# Patient Record
Sex: Female | Born: 1950
Health system: Southern US, Community
[De-identification: ages and names within clinical notes are randomized; demographics above are authoritative.]

## PROBLEM LIST (undated history)

## (undated) ENCOUNTER — Emergency Department (HOSPITAL_BASED_OUTPATIENT_CLINIC_OR_DEPARTMENT_OTHER): Payer: PPO

## (undated) DIAGNOSIS — M797 Fibromyalgia: Secondary | ICD-10-CM

## (undated) DIAGNOSIS — Z9889 Other specified postprocedural states: Secondary | ICD-10-CM

## (undated) DIAGNOSIS — I10 Essential (primary) hypertension: Secondary | ICD-10-CM

## (undated) DIAGNOSIS — R51 Headache: Secondary | ICD-10-CM

## (undated) DIAGNOSIS — H3530 Unspecified macular degeneration: Secondary | ICD-10-CM

## (undated) DIAGNOSIS — M549 Dorsalgia, unspecified: Secondary | ICD-10-CM

## (undated) DIAGNOSIS — R112 Nausea with vomiting, unspecified: Secondary | ICD-10-CM

## (undated) DIAGNOSIS — F329 Major depressive disorder, single episode, unspecified: Secondary | ICD-10-CM

## (undated) DIAGNOSIS — I219 Acute myocardial infarction, unspecified: Secondary | ICD-10-CM

## (undated) DIAGNOSIS — H269 Unspecified cataract: Secondary | ICD-10-CM

## (undated) DIAGNOSIS — N83201 Unspecified ovarian cyst, right side: Secondary | ICD-10-CM

## (undated) DIAGNOSIS — F909 Attention-deficit hyperactivity disorder, unspecified type: Secondary | ICD-10-CM

## (undated) DIAGNOSIS — G8929 Other chronic pain: Secondary | ICD-10-CM

## (undated) DIAGNOSIS — Z8489 Family history of other specified conditions: Secondary | ICD-10-CM

## (undated) DIAGNOSIS — E559 Vitamin D deficiency, unspecified: Secondary | ICD-10-CM

## (undated) DIAGNOSIS — T7840XA Allergy, unspecified, initial encounter: Secondary | ICD-10-CM

## (undated) DIAGNOSIS — M1990 Unspecified osteoarthritis, unspecified site: Secondary | ICD-10-CM

## (undated) DIAGNOSIS — F32A Depression, unspecified: Secondary | ICD-10-CM

## (undated) DIAGNOSIS — R102 Pelvic and perineal pain: Secondary | ICD-10-CM

## (undated) DIAGNOSIS — Z5189 Encounter for other specified aftercare: Secondary | ICD-10-CM

## (undated) DIAGNOSIS — Z862 Personal history of diseases of the blood and blood-forming organs and certain disorders involving the immune mechanism: Secondary | ICD-10-CM

## (undated) DIAGNOSIS — F419 Anxiety disorder, unspecified: Secondary | ICD-10-CM

## (undated) DIAGNOSIS — I2510 Atherosclerotic heart disease of native coronary artery without angina pectoris: Secondary | ICD-10-CM

## (undated) DIAGNOSIS — R011 Cardiac murmur, unspecified: Secondary | ICD-10-CM

## (undated) DIAGNOSIS — D649 Anemia, unspecified: Secondary | ICD-10-CM

## (undated) DIAGNOSIS — R519 Headache, unspecified: Secondary | ICD-10-CM

## (undated) DIAGNOSIS — N189 Chronic kidney disease, unspecified: Secondary | ICD-10-CM

## (undated) DIAGNOSIS — E785 Hyperlipidemia, unspecified: Secondary | ICD-10-CM

## (undated) HISTORY — DX: Nausea with vomiting, unspecified: R11.2

## (undated) HISTORY — DX: Depression, unspecified: F32.A

## (undated) HISTORY — DX: Other chronic pain: G89.29

## (undated) HISTORY — DX: Fibromyalgia: M79.7

## (undated) HISTORY — DX: Anemia, unspecified: D64.9

## (undated) HISTORY — DX: Unspecified cataract: H26.9

## (undated) HISTORY — DX: Unspecified macular degeneration: H35.30

## (undated) HISTORY — DX: Chronic kidney disease, unspecified: N18.9

## (undated) HISTORY — DX: Unspecified ovarian cyst, right side: N83.201

## (undated) HISTORY — DX: Other specified postprocedural states: Z98.890

## (undated) HISTORY — DX: Major depressive disorder, single episode, unspecified: F32.9

## (undated) HISTORY — DX: Unspecified osteoarthritis, unspecified site: M19.90

## (undated) HISTORY — DX: Anxiety disorder, unspecified: F41.9

## (undated) HISTORY — DX: Attention-deficit hyperactivity disorder, unspecified type: F90.9

## (undated) HISTORY — DX: Dorsalgia, unspecified: M54.9

## (undated) HISTORY — DX: Headache: R51

## (undated) HISTORY — DX: Vitamin D deficiency, unspecified: E55.9

## (undated) HISTORY — DX: Cardiac murmur, unspecified: R01.1

## (undated) HISTORY — PX: WISDOM TOOTH EXTRACTION: SHX21

## (undated) HISTORY — PX: BACK SURGERY: SHX140

## (undated) HISTORY — DX: Personal history of diseases of the blood and blood-forming organs and certain disorders involving the immune mechanism: Z86.2

## (undated) HISTORY — DX: Hyperlipidemia, unspecified: E78.5

## (undated) HISTORY — DX: Allergy, unspecified, initial encounter: T78.40XA

## (undated) HISTORY — DX: Encounter for other specified aftercare: Z51.89

## (undated) HISTORY — PX: TUBAL LIGATION: SHX77

## (undated) HISTORY — DX: Pelvic and perineal pain: R10.2

## (undated) HISTORY — DX: Headache, unspecified: R51.9

---

## 1973-11-12 HISTORY — PX: APPENDECTOMY: SHX54

## 1987-11-13 HISTORY — PX: PELVIC LAPAROSCOPY: SHX162

## 1992-11-12 HISTORY — PX: ABDOMINAL HYSTERECTOMY: SHX81

## 1994-11-12 HISTORY — PX: KNEE SURGERY: SHX244

## 1999-09-18 ENCOUNTER — Other Ambulatory Visit: Admission: RE | Admit: 1999-09-18 | Discharge: 1999-09-18 | Payer: Self-pay | Admitting: Obstetrics and Gynecology

## 2000-05-20 ENCOUNTER — Emergency Department (HOSPITAL_COMMUNITY): Admission: EM | Admit: 2000-05-20 | Discharge: 2000-05-20 | Payer: Self-pay | Admitting: Emergency Medicine

## 2000-05-24 ENCOUNTER — Emergency Department (HOSPITAL_COMMUNITY): Admission: EM | Admit: 2000-05-24 | Discharge: 2000-05-24 | Payer: Self-pay

## 2001-03-20 ENCOUNTER — Other Ambulatory Visit: Admission: RE | Admit: 2001-03-20 | Discharge: 2001-03-20 | Payer: Self-pay | Admitting: Obstetrics and Gynecology

## 2001-06-30 ENCOUNTER — Encounter: Payer: Self-pay | Admitting: Neurology

## 2001-06-30 ENCOUNTER — Ambulatory Visit (HOSPITAL_COMMUNITY): Admission: RE | Admit: 2001-06-30 | Discharge: 2001-06-30 | Payer: Self-pay | Admitting: Neurology

## 2002-11-27 ENCOUNTER — Other Ambulatory Visit: Admission: RE | Admit: 2002-11-27 | Discharge: 2002-11-27 | Payer: Self-pay | Admitting: Obstetrics and Gynecology

## 2005-06-25 ENCOUNTER — Other Ambulatory Visit: Admission: RE | Admit: 2005-06-25 | Discharge: 2005-06-25 | Payer: Self-pay | Admitting: Obstetrics and Gynecology

## 2006-01-11 ENCOUNTER — Encounter: Admission: RE | Admit: 2006-01-11 | Discharge: 2006-01-11 | Payer: Self-pay | Admitting: Obstetrics and Gynecology

## 2006-01-15 ENCOUNTER — Emergency Department (HOSPITAL_COMMUNITY): Admission: EM | Admit: 2006-01-15 | Discharge: 2006-01-15 | Payer: Self-pay | Admitting: Emergency Medicine

## 2006-02-02 ENCOUNTER — Encounter: Admission: RE | Admit: 2006-02-02 | Discharge: 2006-02-02 | Payer: Self-pay | Admitting: Neurology

## 2006-02-04 ENCOUNTER — Encounter: Admission: RE | Admit: 2006-02-04 | Discharge: 2006-02-04 | Payer: Self-pay | Admitting: Obstetrics and Gynecology

## 2006-06-26 ENCOUNTER — Other Ambulatory Visit: Admission: RE | Admit: 2006-06-26 | Discharge: 2006-06-26 | Payer: Self-pay | Admitting: Obstetrics and Gynecology

## 2007-07-22 ENCOUNTER — Encounter: Admission: RE | Admit: 2007-07-22 | Discharge: 2007-10-20 | Payer: Self-pay | Admitting: Neurology

## 2007-10-01 ENCOUNTER — Other Ambulatory Visit: Admission: RE | Admit: 2007-10-01 | Discharge: 2007-10-01 | Payer: Self-pay | Admitting: Obstetrics and Gynecology

## 2008-11-09 ENCOUNTER — Encounter: Payer: Self-pay | Admitting: Obstetrics and Gynecology

## 2008-11-09 ENCOUNTER — Ambulatory Visit: Payer: Self-pay | Admitting: Obstetrics and Gynecology

## 2008-11-09 ENCOUNTER — Other Ambulatory Visit: Admission: RE | Admit: 2008-11-09 | Discharge: 2008-11-09 | Payer: Self-pay | Admitting: Obstetrics and Gynecology

## 2009-02-03 ENCOUNTER — Encounter: Admission: RE | Admit: 2009-02-03 | Discharge: 2009-02-03 | Payer: Self-pay | Admitting: Neurology

## 2009-02-18 ENCOUNTER — Encounter: Admission: RE | Admit: 2009-02-18 | Discharge: 2009-02-18 | Payer: Self-pay | Admitting: Neurosurgery

## 2009-09-28 ENCOUNTER — Encounter: Admission: RE | Admit: 2009-09-28 | Discharge: 2009-09-28 | Payer: Self-pay | Admitting: Neurosurgery

## 2010-01-24 ENCOUNTER — Other Ambulatory Visit: Admission: RE | Admit: 2010-01-24 | Discharge: 2010-01-24 | Payer: Self-pay | Admitting: Obstetrics and Gynecology

## 2010-01-24 ENCOUNTER — Ambulatory Visit: Payer: Self-pay | Admitting: Obstetrics and Gynecology

## 2011-04-25 ENCOUNTER — Other Ambulatory Visit (HOSPITAL_COMMUNITY)
Admission: RE | Admit: 2011-04-25 | Discharge: 2011-04-25 | Disposition: A | Discharge status: Home / Self Care | Payer: BC Managed Care – PPO | Source: Ambulatory Visit | Attending: Obstetrics and Gynecology | Admitting: Obstetrics and Gynecology

## 2011-04-25 ENCOUNTER — Encounter (INDEPENDENT_AMBULATORY_CARE_PROVIDER_SITE_OTHER): Payer: BC Managed Care – PPO | Admitting: Obstetrics and Gynecology

## 2011-04-25 ENCOUNTER — Other Ambulatory Visit: Payer: Self-pay | Admitting: Obstetrics and Gynecology

## 2011-04-25 DIAGNOSIS — Z124 Encounter for screening for malignant neoplasm of cervix: Secondary | ICD-10-CM | POA: Insufficient documentation

## 2011-04-25 DIAGNOSIS — Z01419 Encounter for gynecological examination (general) (routine) without abnormal findings: Secondary | ICD-10-CM

## 2011-06-06 ENCOUNTER — Other Ambulatory Visit: Payer: Self-pay

## 2011-06-06 ENCOUNTER — Ambulatory Visit (INDEPENDENT_AMBULATORY_CARE_PROVIDER_SITE_OTHER): Payer: BC Managed Care – PPO | Admitting: Gynecology

## 2011-06-06 DIAGNOSIS — Z780 Asymptomatic menopausal state: Secondary | ICD-10-CM

## 2011-06-06 DIAGNOSIS — E2839 Other primary ovarian failure: Secondary | ICD-10-CM

## 2011-06-06 DIAGNOSIS — Z13820 Encounter for screening for osteoporosis: Secondary | ICD-10-CM

## 2011-07-31 ENCOUNTER — Encounter: Payer: Self-pay | Admitting: Obstetrics and Gynecology

## 2012-05-10 ENCOUNTER — Other Ambulatory Visit: Payer: Self-pay | Admitting: Obstetrics and Gynecology

## 2013-01-11 ENCOUNTER — Other Ambulatory Visit: Payer: Self-pay | Admitting: Neurology

## 2013-01-11 DIAGNOSIS — M5416 Radiculopathy, lumbar region: Secondary | ICD-10-CM

## 2013-01-11 DIAGNOSIS — M549 Dorsalgia, unspecified: Secondary | ICD-10-CM

## 2013-01-14 ENCOUNTER — Other Ambulatory Visit: Payer: Self-pay | Admitting: Neurology

## 2013-01-14 ENCOUNTER — Ambulatory Visit
Admission: RE | Admit: 2013-01-14 | Discharge: 2013-01-14 | Disposition: A | Discharge status: Home / Self Care | Payer: BC Managed Care – PPO | Source: Ambulatory Visit | Attending: Neurology | Admitting: Neurology

## 2013-01-14 DIAGNOSIS — M549 Dorsalgia, unspecified: Secondary | ICD-10-CM

## 2013-01-14 DIAGNOSIS — M5416 Radiculopathy, lumbar region: Secondary | ICD-10-CM

## 2013-01-14 MED ORDER — IOHEXOL 180 MG/ML  SOLN
1.0000 mL | Freq: Once | INTRAMUSCULAR | Status: AC | PRN
  Administered 2013-01-14: 1 mL via EPIDURAL

## 2013-01-14 MED ORDER — METHYLPREDNISOLONE ACETATE 40 MG/ML INJ SUSP (RADIOLOG
120.0000 mg | Freq: Once | INTRAMUSCULAR | Status: AC
  Administered 2013-01-14: 120 mg via EPIDURAL

## 2013-04-08 ENCOUNTER — Other Ambulatory Visit: Payer: Self-pay | Admitting: Obstetrics and Gynecology

## 2013-04-14 ENCOUNTER — Other Ambulatory Visit: Payer: Self-pay | Admitting: Obstetrics and Gynecology

## 2013-04-21 ENCOUNTER — Telehealth: Payer: Self-pay | Admitting: *Deleted

## 2013-04-21 ENCOUNTER — Other Ambulatory Visit: Payer: Self-pay | Admitting: Obstetrics and Gynecology

## 2013-04-21 NOTE — Telephone Encounter (Signed)
Pt called requesting refill on premarin 0.625 mg cream pt hasn't been seen in office since [place] 2012. I left message that she will need a annual appointment with TF to schedule.

## 2013-04-22 ENCOUNTER — Telehealth: Payer: Self-pay | Admitting: *Deleted

## 2013-04-22 MED ORDER — ESTROGENS CONJUGATED 0.625 MG PO TABS: 0.6250 mg | ORAL_TABLET | Freq: Every day | ORAL | Status: DC

## 2013-04-22 NOTE — Telephone Encounter (Signed)
Pt scheduled annual on 04/29/13 with [place] pt was last seen in [place] 2012. She is requesting premarin 0.625. I will send rx in since pt has scheduled appointment. In her paper chart there is documentation of taking this medication.

## 2013-04-29 ENCOUNTER — Telehealth: Payer: Self-pay | Admitting: Diagnostic Neuroimaging

## 2013-04-29 ENCOUNTER — Ambulatory Visit (INDEPENDENT_AMBULATORY_CARE_PROVIDER_SITE_OTHER): Payer: BC Managed Care – PPO | Admitting: Women's Health

## 2013-04-29 ENCOUNTER — Encounter: Payer: Self-pay | Admitting: Women's Health

## 2013-04-29 VITALS — BP 124/74 | Ht 63.25 in | Wt 184.5 lb

## 2013-04-29 DIAGNOSIS — Z79890 Hormone replacement therapy (postmenopausal): Secondary | ICD-10-CM

## 2013-04-29 DIAGNOSIS — Z124 Encounter for screening for malignant neoplasm of cervix: Secondary | ICD-10-CM

## 2013-04-29 DIAGNOSIS — Z01419 Encounter for gynecological examination (general) (routine) without abnormal findings: Secondary | ICD-10-CM

## 2013-04-29 MED ORDER — ESTROGENS CONJUGATED 0.625 MG PO TABS: 0.6250 mg | ORAL_TABLET | Freq: Every day | ORAL | Status: DC

## 2013-04-29 NOTE — Patient Instructions (Addendum)
Health Recommendations for Postmenopausal Women Respected and ongoing research has looked at the most common causes of death, disability, and poor quality of life in postmenopausal women. The causes include heart disease, diseases of blood vessels, diabetes, depression, cancer, and bone loss (osteoporosis). Many things can be done to help lower the chances of developing these and other common problems: CARDIOVASCULAR DISEASE Heart Disease: A heart attack is a medical emergency. Know the signs and symptoms of a heart attack. Below are things women can do to reduce their risk for heart disease.   Do not smoke. If you smoke, quit.  Aim for a healthy weight. Being overweight causes many preventable deaths. Eat a healthy and balanced diet and drink an adequate amount of liquids.  Get moving. Make a commitment to be more physically active. Aim for 30 minutes of activity on most, if not all days of the week.  Eat for heart health. Choose a diet that is low in saturated fat and cholesterol and eliminate trans fat. Include whole grains, vegetables, and fruits. Read and understand the labels on food containers before buying.  Know your numbers. Ask your caregiver to check your blood pressure, cholesterol (total, HDL, LDL, triglycerides) and blood glucose. Work with your caregiver on improving your entire clinical picture.  High blood pressure. Limit or stop your table salt intake (try salt substitute and food seasonings). Avoid salty foods and drinks. Read labels on food containers before buying. Eating well and exercising can help control high blood pressure. STROKE  Stroke is a medical emergency. Stroke may be the result of a blood clot in a blood vessel in the brain or by a brain hemorrhage (bleeding). Know the signs and symptoms of a stroke. To lower the risk of developing a stroke:  Avoid fatty foods.  Quit smoking.  Control your diabetes, blood pressure, and irregular heart rate. THROMBOPHLEBITIS  (BLOOD CLOT) OF THE LEG  Becoming overweight and leading a stationary lifestyle may also contribute to developing blood clots. Controlling your diet and exercising will help lower the risk of developing blood clots. CANCER SCREENING  Breast Cancer: Take steps to reduce your risk of breast cancer.  You should practice "breast self-awareness." This means understanding the normal appearance and feel of your breasts and should include breast self-examination. Any changes detected, no matter how small, should be reported to your caregiver.  After age 40, you should have a clinical breast exam (CBE) every year.  Starting at age 40, you should consider having a mammogram (breast X-ray) every year.  If you have a family history of breast cancer, talk to your caregiver about genetic screening.  If you are at high risk for breast cancer, talk to your caregiver about having an MRI and a mammogram every year.  Intestinal or Stomach Cancer: Tests to consider are a rectal exam, fecal occult blood, sigmoidoscopy, and colonoscopy. Women who are high risk may need to be screened at an earlier age and more often.  Cervical Cancer:  Beginning at age 30, you should have a Pap test every 3 years as long as the past 3 Pap tests have been normal.  If you have had past treatment for cervical cancer or a condition that could lead to cancer, you need Pap tests and screening for cancer for at least 20 years after your treatment.  If you had a hysterectomy for a problem that was not cancer or a condition that could lead to cancer, then you no longer need Pap tests.    If you are between ages 65 and 70, and you have had normal Pap tests going back 10 years, you no longer need Pap tests.  If Pap tests have been discontinued, risk factors (such as a new sexual partner) need to be reassessed to determine if screening should be resumed.  Some medical problems can increase the chance of getting cervical cancer. In these  cases, your caregiver may recommend more frequent screening and Pap tests.  Uterine Cancer: If you have vaginal bleeding after reaching menopause, you should notify your caregiver.  Ovarian cancer: Other than yearly pelvic exams, there are no reliable tests available to screen for ovarian cancer at this time except for yearly pelvic exams.  Lung Cancer: Yearly chest X-rays can detect lung cancer and should be done on high risk women, such as cigarette smokers and women with chronic lung disease (emphysema).  Skin Cancer: A complete body skin exam should be done at your yearly examination. Avoid overexposure to the sun and ultraviolet light lamps. Use a strong sun block cream when in the sun. All of these things are important in lowering the risk of skin cancer. MENOPAUSE Menopause Symptoms: Hormone therapy products are effective for treating symptoms associated with menopause:  Moderate to severe hot flashes.  Night sweats.  Mood swings.  Headaches.  Tiredness.  Loss of sex drive.  Insomnia.  Other symptoms. Hormone replacement carries certain risks, especially in older women. Women who use or are thinking about using estrogen or estrogen with progestin treatments should discuss that with their caregiver. Your caregiver will help you understand the benefits and risks. The ideal dose of hormone replacement therapy is not known. The Food and Drug Administration (FDA) has concluded that hormone therapy should be used only at the lowest doses and for the shortest amount of time to reach treatment goals.  OSTEOPOROSIS Protecting Against Bone Loss and Preventing Fracture: If you use hormone therapy for prevention of bone loss (osteoporosis), the risks for bone loss must outweigh the risk of the therapy. Ask your caregiver about other medications known to be safe and effective for preventing bone loss and fractures. To guard against bone loss or fractures, the following is recommended:  If  you are less than age 50, take 1000 mg of calcium and at least 600 mg of Vitamin D per day.  If you are greater than age 50 but less than age 70, take 1200 mg of calcium and at least 600 mg of Vitamin D per day.  If you are greater than age 70, take 1200 mg of calcium and at least 800 mg of Vitamin D per day. Smoking and excessive alcohol intake increases the risk of osteoporosis. Eat foods rich in calcium and vitamin D and do weight bearing exercises several times a week as your caregiver suggests. DIABETES Diabetes Melitus: If you have Type I or Type 2 diabetes, you should keep your blood sugar under control with diet, exercise and recommended medication. Avoid too many sweets, starchy and fatty foods. Being overweight can make control more difficult. COGNITION AND MEMORY Cognition and Memory: Menopausal hormone therapy is not recommended for the prevention of cognitive disorders such as Alzheimer's disease or memory loss.  DEPRESSION  Depression may occur at any age, but is common in elderly women. The reasons may be because of physical, medical, social (loneliness), or financial problems and needs. If you are experiencing depression because of medical problems and control of symptoms, talk to your caregiver about this. Physical activity and   exercise may help with mood and sleep. Community and volunteer involvement may help your sense of value and worth. If you have depression and you feel that the problem is getting worse or becoming severe, talk to your caregiver about treatment options that are best for you. ACCIDENTS  Accidents are common and can be serious in the elderly woman. Prepare your house to prevent accidents. Eliminate throw rugs, place hand bars in the bath, shower and toilet areas. Avoid wearing high heeled shoes or walking on wet, snowy, and icy areas. Limit or stop driving if you have vision or hearing problems, or you feel you are unsteady with you movements and  reflexes. HEPATITIS C Hepatitis C is a type of viral infection affecting the liver. It is spread mainly through contact with blood from an infected person. It can be treated, but if left untreated, it can lead to severe liver damage over years. Many people who are infected do not know that the virus is in their blood. If you are a "baby-boomer", it is recommended that you have one screening test for Hepatitis C. IMMUNIZATIONS  Several immunizations are important to consider having during your senior years, including:   Tetanus, diptheria, and pertussis booster shot.  Influenza every year before the flu season begins.  Pneumonia vaccine.  Shingles vaccine.  Others as indicated based on your specific needs. Talk to your caregiver about these. Document Released: 12/21/2005 Document Revised: 10/15/2012 Document Reviewed: 08/16/2008 Exit[employer] Patient Information 2014 [employer].  

## 2013-04-29 NOTE — Progress Notes (Signed)
Robin Arellano September 18, 1951 [id number]    History:    The patient presents for annual exam.  TAH with BSO in 94 for endometriosis and a cyst. Has been on Premarin 0.625 with good relief of hot flushes, did not tolerate estradiol or estrogen patch. Normal DEXA 2012 T score -0.5 at [facility] and hip average 0.4. Normal Pap and mammogram history. Has not had a colonoscopy.   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   ROS:  A  ROS was performed and pertinent positives and negatives are included in the history.  Exam:  Filed Vitals:   04/29/13 1023  BP: 124/74    General appearance:  Normal Head/Neck:  Normal, without cervical or supraclavicular adenopathy. Thyroid:  Symmetrical, normal in size, without palpable masses or nodularity. Respiratory  Effort:  Normal  Auscultation:  Clear without wheezing or rhonchi Cardiovascular  Auscultation:  Regular rate, without rubs, murmurs or gallops  Edema/varicosities:  Not grossly evident Abdominal  Soft,nontender, without masses, guarding or rebound.  Liver/spleen:  No organomegaly noted  Hernia:  None appreciated  Skin  Inspection:  Grossly normal  Palpation:  Grossly normal Neurologic/psychiatric  Orientation:  Normal with appropriate conversation.  Mood/affect:  Normal  Genitourinary    Breasts: Examined lying and sitting.     Right: Without masses, retractions, discharge or axillary adenopathy.     Left: Without masses, retractions, discharge or axillary adenopathy.   Inguinal/mons:  Normal without inguinal adenopathy  External genitalia:  Normal  BUS/Urethra/Skene's glands:  Normal  Bladder:  Normal  Vagina:  Normal  Cervix:  Absent  Uterus:  Absent  Adnexa/parametria:     Rt: Without masses or tenderness.   Lt: Without masses or tenderness.  Anus and perineum: Normal  Digital rectal  exam: Normal sphincter tone without palpated masses or tenderness  Assessment/Plan:  62 y.o. M. WF G2 P2 for annual exam with no complaints.  Postmenopausal on Premarin   Plan: HRT reviewed risks of blood clots, strokes and breast cancer. Reports numerous hot flushes when has tried to stop and prefers to continue on Premarin. Premarin 0.625 prescription, proper use given and reviewed. Discussed weaning down, declines at this time. SBE's, overdue for mammogram and instructed to schedule. Reviewed importance of increasing regular exercise and decreasing calories for weight loss for health. Reviewed importance of a screening colonoscopy instructed to schedule. UA only. Labs at primary care.    Harrington Challenger WHNP, 1:52 PM 04/29/2013

## 2013-04-29 NOTE — Telephone Encounter (Signed)
Calling patient to reschedule appointment with Dr. Marjory Lies on 06/30/2013

## 2013-04-30 LAB — URINALYSIS W MICROSCOPIC + REFLEX CULTURE
Bacteria, UA: NONE SEEN
Bilirubin Urine: NEGATIVE
Casts: NONE SEEN
Crystals: NONE SEEN
Glucose, UA: NEGATIVE mg/dL
Hgb urine dipstick: NEGATIVE
Ketones, ur: NEGATIVE mg/dL
Leukocytes, UA: NEGATIVE
Nitrite: NEGATIVE
Protein, ur: NEGATIVE mg/dL
Specific Gravity, Urine: 1.009 (ref 1.005–1.030)
Squamous Epithelial / LPF: NONE SEEN
Urobilinogen, UA: 0.2 mg/dL (ref 0.0–1.0)
pH: 6 (ref 5.0–8.0)

## 2013-05-28 ENCOUNTER — Other Ambulatory Visit: Payer: Self-pay | Admitting: Diagnostic Neuroimaging

## 2013-05-28 ENCOUNTER — Other Ambulatory Visit: Payer: Self-pay

## 2013-05-28 MED ORDER — HYDROCODONE-ACETAMINOPHEN 10-300 MG PO TABS: 1.0000 | ORAL_TABLET | Freq: Two times a day (BID) | ORAL | Status: DC

## 2013-05-28 MED ORDER — BUTALBITAL-APAP-CAFFEINE 50-325-40 MG PO TABS: ORAL_TABLET | ORAL | Status: DC

## 2013-05-28 NOTE — Telephone Encounter (Signed)
[facility] patient assigned to Dr Penumalli. 

## 2013-05-29 ENCOUNTER — Encounter: Payer: Self-pay | Admitting: Diagnostic Neuroimaging

## 2013-05-29 NOTE — Telephone Encounter (Signed)
This encounter was created in error - please disregard.

## 2013-06-09 ENCOUNTER — Telehealth: Payer: Self-pay | Admitting: Diagnostic Neuroimaging

## 2013-06-30 ENCOUNTER — Ambulatory Visit: Payer: Self-pay | Admitting: Diagnostic Neuroimaging

## 2013-07-02 ENCOUNTER — Other Ambulatory Visit: Payer: Self-pay | Admitting: Diagnostic Neuroimaging

## 2013-07-03 ENCOUNTER — Other Ambulatory Visit: Payer: Self-pay

## 2013-07-03 MED ORDER — BUTALBITAL-APAP-CAFFEINE 50-325-40 MG PO TABS: ORAL_TABLET | ORAL | Status: DC

## 2013-07-03 MED ORDER — HYDROCODONE-ACETAMINOPHEN 10-300 MG PO TABS: 1.0000 | ORAL_TABLET | Freq: Two times a day (BID) | ORAL | Status: DC

## 2013-07-03 NOTE — Telephone Encounter (Signed)
[facility] patient assigned to Dr Marjory Lies.  He is out of the office today, forwarding request to Dr Frances Furbish, [facility] Sarna

## 2013-07-03 NOTE — Telephone Encounter (Signed)
Rx's signed and faxed for one week supply per Dr Frances Furbish because patient has appt with Dr Marjory Lies next week .

## 2013-07-06 ENCOUNTER — Other Ambulatory Visit: Payer: Self-pay | Admitting: Diagnostic Neuroimaging

## 2013-07-07 ENCOUNTER — Ambulatory Visit: Payer: Self-pay | Admitting: Diagnostic Neuroimaging

## 2013-07-09 ENCOUNTER — Encounter: Payer: Self-pay | Admitting: Diagnostic Neuroimaging

## 2013-07-09 ENCOUNTER — Ambulatory Visit (INDEPENDENT_AMBULATORY_CARE_PROVIDER_SITE_OTHER): Payer: BC Managed Care – PPO | Admitting: Diagnostic Neuroimaging

## 2013-07-09 VITALS — BP 147/85 | HR 87 | Temp 98.2°F | Ht 64.5 in | Wt 192.0 lb

## 2013-07-09 DIAGNOSIS — R51 Headache: Secondary | ICD-10-CM

## 2013-07-09 DIAGNOSIS — M542 Cervicalgia: Secondary | ICD-10-CM | POA: Insufficient documentation

## 2013-07-09 DIAGNOSIS — M545 Low back pain, unspecified: Secondary | ICD-10-CM | POA: Insufficient documentation

## 2013-07-09 DIAGNOSIS — M797 Fibromyalgia: Secondary | ICD-10-CM

## 2013-07-09 DIAGNOSIS — IMO0001 Reserved for inherently not codable concepts without codable children: Secondary | ICD-10-CM

## 2013-07-09 DIAGNOSIS — R519 Headache, unspecified: Secondary | ICD-10-CM | POA: Insufficient documentation

## 2013-07-09 MED ORDER — BUTALBITAL-APAP-CAFFEINE 50-325-40 MG PO TABS: ORAL_TABLET | ORAL | Status: DC

## 2013-07-09 MED ORDER — HYDROCODONE-ACETAMINOPHEN 10-300 MG PO TABS: 1.0000 | ORAL_TABLET | Freq: Two times a day (BID) | ORAL | Status: DC

## 2013-07-09 NOTE — Patient Instructions (Signed)
We will transition you to a pain management clinic for long term narcotics.
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
## 2013-08-07 ENCOUNTER — Other Ambulatory Visit: Payer: Self-pay | Admitting: Diagnostic Neuroimaging

## 2013-08-26 ENCOUNTER — Encounter: Payer: Self-pay | Admitting: Diagnostic Neuroimaging

## 2013-08-27 ENCOUNTER — Other Ambulatory Visit: Payer: Self-pay

## 2013-08-27 MED ORDER — HYDROCODONE-ACETAMINOPHEN 10-300 MG PO TABS: 1.0000 | ORAL_TABLET | Freq: Two times a day (BID) | ORAL | Status: DC

## 2013-08-27 NOTE — Telephone Encounter (Signed)
Patient sent an e-mail via my chart requesting a refill on Hydrocodone.  Since this is now a CII Rx, the patient will need to get a written copy each fill.  Dr Marjory Lies is out of the office, forwarding request to Dr Anne Hahn, [facility].

## 2013-08-28 NOTE — Telephone Encounter (Signed)
Rx signed, ready for pick up.  I called the patient, got no answer.  Left message.  

## 2013-09-17 ENCOUNTER — Other Ambulatory Visit: Payer: Self-pay

## 2013-09-23 ENCOUNTER — Other Ambulatory Visit: Payer: Self-pay | Admitting: Neurology

## 2013-09-24 ENCOUNTER — Other Ambulatory Visit: Payer: Self-pay

## 2013-09-24 ENCOUNTER — Telehealth: Payer: Self-pay | Admitting: Neurology

## 2013-09-24 MED ORDER — HYDROCODONE-ACETAMINOPHEN 10-300 MG PO TABS: 1.0000 | ORAL_TABLET | Freq: Two times a day (BID) | ORAL | Status: DC

## 2013-09-24 NOTE — Telephone Encounter (Signed)
Called patient to inform her that her prescription was ready to be picked up, patient verbalized understanding and she will pick it up at the front desk.

## 2013-10-19 ENCOUNTER — Other Ambulatory Visit: Payer: Self-pay | Admitting: Neurology

## 2013-10-20 ENCOUNTER — Other Ambulatory Visit: Payer: Self-pay | Admitting: Neurology

## 2013-10-20 MED ORDER — HYDROCODONE-ACETAMINOPHEN 10-300 MG PO TABS: 1.0000 | ORAL_TABLET | Freq: Two times a day (BID) | ORAL | Status: DC

## 2013-10-21 NOTE — Telephone Encounter (Signed)
Called patient and left message for her to pick up her prescription at the front desk and if she has any other problems, questions or concerns to call the office.

## 2013-11-23 ENCOUNTER — Other Ambulatory Visit: Payer: Self-pay | Admitting: Neurology

## 2013-11-23 MED ORDER — HYDROCODONE-ACETAMINOPHEN 10-300 MG PO TABS: 1.0000 | ORAL_TABLET | Freq: Two times a day (BID) | ORAL | Status: DC

## 2013-11-24 NOTE — Telephone Encounter (Signed)
Patient was called and informed that her rx is ready to be picked up at the front desk.  Patient was informed to call with any questions or concerns.  Patient verbalized understanding.

## 2013-12-23 ENCOUNTER — Other Ambulatory Visit: Payer: Self-pay | Admitting: Diagnostic Neuroimaging

## 2013-12-23 MED ORDER — HYDROCODONE-ACETAMINOPHEN 10-300 MG PO TABS: 1.0000 | ORAL_TABLET | Freq: Two times a day (BID) | ORAL | Status: DC

## 2013-12-25 NOTE — Telephone Encounter (Signed)
Called patient and left message informing her that her Rx was ready to be picked up at the front desk and if she has any other problems, questions or concerns to call the office. °

## 2014-01-06 ENCOUNTER — Other Ambulatory Visit: Payer: Self-pay | Admitting: Diagnostic Neuroimaging

## 2014-01-08 NOTE — Telephone Encounter (Signed)
Rx has been faxed.

## 2014-01-11 ENCOUNTER — Ambulatory Visit (INDEPENDENT_AMBULATORY_CARE_PROVIDER_SITE_OTHER): Payer: BC Managed Care – PPO | Admitting: Diagnostic Neuroimaging

## 2014-01-11 ENCOUNTER — Encounter: Payer: Self-pay | Admitting: Diagnostic Neuroimaging

## 2014-01-11 VITALS — BP 142/82 | HR 77 | Ht 63.5 in | Wt 197.0 lb

## 2014-01-11 DIAGNOSIS — M797 Fibromyalgia: Secondary | ICD-10-CM

## 2014-01-11 DIAGNOSIS — M542 Cervicalgia: Secondary | ICD-10-CM

## 2014-01-11 DIAGNOSIS — IMO0001 Reserved for inherently not codable concepts without codable children: Secondary | ICD-10-CM

## 2014-01-11 DIAGNOSIS — M545 Low back pain, unspecified: Secondary | ICD-10-CM
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
## 2014-01-21 ENCOUNTER — Other Ambulatory Visit: Payer: Self-pay | Admitting: Diagnostic Neuroimaging

## 2014-01-22 MED ORDER — HYDROCODONE-ACETAMINOPHEN 10-300 MG PO TABS: 1.0000 | ORAL_TABLET | Freq: Two times a day (BID) | ORAL | Status: DC

## 2014-01-22 NOTE — Telephone Encounter (Signed)
Patient was notified that her rx is ready for pickup at the front desk.  Patient verbalized understanding. 

## 2014-01-22 NOTE — Telephone Encounter (Signed)
Patient indicates she has an appt with Dr Maryjean Ka on 03/31

## 2014-02-10 NOTE — Telephone Encounter (Signed)
Closing encounter

## 2014-05-05 ENCOUNTER — Other Ambulatory Visit: Payer: Self-pay | Admitting: Women's Health

## 2014-05-26 ENCOUNTER — Ambulatory Visit (INDEPENDENT_AMBULATORY_CARE_PROVIDER_SITE_OTHER): Payer: BC Managed Care – PPO | Admitting: Women's Health

## 2014-05-26 ENCOUNTER — Encounter: Payer: Self-pay | Admitting: Women's Health

## 2014-05-26 VITALS — BP 122/78 | Ht 63.0 in | Wt 188.0 lb

## 2014-05-26 DIAGNOSIS — Z01419 Encounter for gynecological examination (general) (routine) without abnormal findings: Secondary | ICD-10-CM

## 2014-05-26 DIAGNOSIS — Z79890 Hormone replacement therapy (postmenopausal): Secondary | ICD-10-CM

## 2014-05-26 MED ORDER — ESTROGENS CONJUGATED 0.625 MG PO TABS: ORAL_TABLET | ORAL | Status: DC
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
## 2014-05-26 NOTE — Patient Instructions (Signed)
Health Recommendations for Postmenopausal Women Respected and ongoing research has looked at the most common causes of death, disability, and poor quality of life in postmenopausal women. The causes include heart disease, diseases of blood vessels, diabetes, depression, cancer, and bone loss (osteoporosis). Many things can be done to help lower the chances of developing these and other common problems: CARDIOVASCULAR DISEASE Heart Disease: A heart attack is a medical emergency. Know the signs and symptoms of a heart attack. Below are things women can do to reduce their risk for heart disease.   Do not smoke. If you smoke, quit.  Aim for a healthy weight. Being overweight causes many preventable deaths. Eat a healthy and balanced diet and drink an adequate amount of liquids.  Get moving. Make a commitment to be more physically active. Aim for 30 minutes of activity on most, if not all days of the week.  Eat for heart health. Choose a diet that is low in saturated fat and cholesterol and eliminate trans fat. Include whole grains, vegetables, and fruits. Read and understand the labels on food containers before buying.  Know your numbers. Ask your caregiver to check your blood pressure, cholesterol (total, HDL, LDL, triglycerides) and blood glucose. Work with your caregiver on improving your entire clinical picture.  High blood pressure. Limit or stop your table salt intake (try salt substitute and food seasonings). Avoid salty foods and drinks. Read labels on food containers before buying. Eating well and exercising can help control high blood pressure. STROKE  Stroke is a medical emergency. Stroke may be the result of a blood clot in a blood vessel in the brain or by a brain hemorrhage (bleeding). Know the signs and symptoms of a stroke. To lower the risk of developing a stroke:  Avoid fatty foods.  Quit smoking.  Control your diabetes, blood pressure, and irregular heart rate. THROMBOPHLEBITIS  (BLOOD CLOT) OF THE LEG  Becoming overweight and leading a stationary lifestyle may also contribute to developing blood clots. Controlling your diet and exercising will help lower the risk of developing blood clots. CANCER SCREENING  Breast Cancer: Take steps to reduce your risk of breast cancer.  You should practice "breast self-awareness." This means understanding the normal appearance and feel of your breasts and should include breast self-examination. Any changes detected, no matter how small, should be reported to your caregiver.  After age 40, you should have a clinical breast exam (CBE) every year.  Starting at age 40, you should consider having a mammogram (breast X-ray) every year.  If you have a family history of breast cancer, talk to your caregiver about genetic screening.  If you are at high risk for breast cancer, talk to your caregiver about having an MRI and a mammogram every year.  Intestinal or Stomach Cancer: Tests to consider are a rectal exam, fecal occult blood, sigmoidoscopy, and colonoscopy. Women who are high risk may need to be screened at an earlier age and more often.  Cervical Cancer:  Beginning at age 30, you should have a Pap test every 3 years as long as the past 3 Pap tests have been normal.  If you have had past treatment for cervical cancer or a condition that could lead to cancer, you need Pap tests and screening for cancer for at least 20 years after your treatment.  If you had a hysterectomy for a problem that was not cancer or a condition that could lead to cancer, then you no longer need Pap tests.    If you are between ages 65 and 70, and you have had normal Pap tests going back 10 years, you no longer need Pap tests.  If Pap tests have been discontinued, risk factors (such as a new sexual partner) need to be reassessed to determine if screening should be resumed.  Some medical problems can increase the chance of getting cervical cancer. In these  cases, your caregiver may recommend more frequent screening and Pap tests.  Uterine Cancer: If you have vaginal bleeding after reaching menopause, you should notify your caregiver.  Ovarian cancer: Other than yearly pelvic exams, there are no reliable tests available to screen for ovarian cancer at this time except for yearly pelvic exams.  Lung Cancer: Yearly chest X-rays can detect lung cancer and should be done on high risk women, such as cigarette smokers and women with chronic lung disease (emphysema).  Skin Cancer: A complete body skin exam should be done at your yearly examination. Avoid overexposure to the sun and ultraviolet light lamps. Use a strong sun block cream when in the sun. All of these things are important in lowering the risk of skin cancer. MENOPAUSE Menopause Symptoms: Hormone therapy products are effective for treating symptoms associated with menopause:  Moderate to severe hot flashes.  Night sweats.  Mood swings.  Headaches.  Tiredness.  Loss of sex drive.  Insomnia.  Other symptoms. Hormone replacement carries certain risks, especially in older women. Women who use or are thinking about using estrogen or estrogen with progestin treatments should discuss that with their caregiver. Your caregiver will help you understand the benefits and risks. The ideal dose of hormone replacement therapy is not known. The Food and Drug Administration (FDA) has concluded that hormone therapy should be used only at the lowest doses and for the shortest amount of time to reach treatment goals.  OSTEOPOROSIS Protecting Against Bone Loss and Preventing Fracture: If you use hormone therapy for prevention of bone loss (osteoporosis), the risks for bone loss must outweigh the risk of the therapy. Ask your caregiver about other medications known to be safe and effective for preventing bone loss and fractures. To guard against bone loss or fractures, the following is recommended:  If  you are less than age 50, take 1000 mg of calcium and at least 600 mg of Vitamin D per day.  If you are greater than age 50 but less than age 70, take 1200 mg of calcium and at least 600 mg of Vitamin D per day.  If you are greater than age 70, take 1200 mg of calcium and at least 800 mg of Vitamin D per day. Smoking and excessive alcohol intake increases the risk of osteoporosis. Eat foods rich in calcium and vitamin D and do weight bearing exercises several times a week as your caregiver suggests. DIABETES Diabetes Melitus: If you have Type I or Type 2 diabetes, you should keep your blood sugar under control with diet, exercise and recommended medication. Avoid too many sweets, starchy and fatty foods. Being overweight can make control more difficult. COGNITION AND MEMORY Cognition and Memory: Menopausal hormone therapy is not recommended for the prevention of cognitive disorders such as Alzheimer's disease or memory loss.  DEPRESSION  Depression may occur at any age, but is common in elderly women. The reasons may be because of physical, medical, social (loneliness), or financial problems and needs. If you are experiencing depression because of medical problems and control of symptoms, talk to your caregiver about this. Physical activity and   exercise may help with mood and sleep. Community and volunteer involvement may help your sense of value and worth. If you have depression and you feel that the problem is getting worse or becoming severe, talk to your caregiver about treatment options that are best for you. ACCIDENTS  Accidents are common and can be serious in the elderly woman. Prepare your house to prevent accidents. Eliminate throw rugs, place hand bars in the bath, shower and toilet areas. Avoid wearing high heeled shoes or walking on wet, snowy, and icy areas. Limit or stop driving if you have vision or hearing problems, or you feel you are unsteady with you movements and  reflexes. HEPATITIS C Hepatitis C is a type of viral infection affecting the liver. It is spread mainly through contact with blood from an infected person. It can be treated, but if left untreated, it can lead to severe liver damage over years. Many people who are infected do not know that the virus is in their blood. If you are a "baby-boomer", it is recommended that you have one screening test for Hepatitis C. IMMUNIZATIONS  Several immunizations are important to consider having during your senior years, including:   Tetanus, diptheria, and pertussis booster shot.  Influenza every year before the flu season begins.  Pneumonia vaccine.  Shingles vaccine.  Others as indicated based on your specific needs. Talk to your caregiver about these. Document Released: 12/21/2005 Document Revised: 10/15/2012 Document Reviewed: 08/16/2008 [facility] Patient Information 2015 [place], [place]. This information is not intended to replace advice given to you by your health care provider. Make sure you discuss any questions you have with your health care provider.

## 2014-05-27 LAB — URINALYSIS W MICROSCOPIC + REFLEX CULTURE
Bilirubin Urine: NEGATIVE
Casts: NONE SEEN
Glucose, UA: NEGATIVE mg/dL
Hgb urine dipstick: NEGATIVE
Ketones, ur: NEGATIVE mg/dL
Leukocytes, UA: NEGATIVE
Nitrite: NEGATIVE
Protein, ur: NEGATIVE mg/dL
Specific Gravity, Urine: 1.023 (ref 1.005–1.030)
Urobilinogen, UA: 0.2 mg/dL (ref 0.0–1.0)
pH: 5.5 (ref 5.0–8.0)

## 2014-09-13 ENCOUNTER — Encounter: Payer: Self-pay | Admitting: Women's Health

## 2015-05-31 ENCOUNTER — Encounter: Payer: Self-pay | Admitting: Women's Health

## 2015-06-15 ENCOUNTER — Other Ambulatory Visit: Payer: Self-pay | Admitting: Women's Health

## 2015-06-17 ENCOUNTER — Other Ambulatory Visit: Payer: Self-pay

## 2015-06-17 MED ORDER — ESTROGENS CONJUGATED 0.625 MG PO TABS: 0.6250 mg | ORAL_TABLET | Freq: Every day | ORAL | Status: DC

## 2015-06-20 ENCOUNTER — Other Ambulatory Visit: Payer: Self-pay

## 2015-06-20 MED ORDER — ESTROGENS CONJUGATED 0.625 MG PO TABS: 0.6250 mg | ORAL_TABLET | Freq: Every day | ORAL | Status: DC

## 2015-06-27 ENCOUNTER — Encounter: Payer: Self-pay | Admitting: Women's Health

## 2015-06-30 ENCOUNTER — Encounter: Payer: Self-pay | Admitting: Women's Health

## 2015-07-01 ENCOUNTER — Encounter: Payer: Self-pay | Admitting: Women's Health

## 2015-07-01 ENCOUNTER — Ambulatory Visit (INDEPENDENT_AMBULATORY_CARE_PROVIDER_SITE_OTHER): Payer: 59 | Admitting: Women's Health

## 2015-07-01 VITALS — BP 121/76 | Ht 64.0 in | Wt 180.8 lb

## 2015-07-01 DIAGNOSIS — Z01419 Encounter for gynecological examination (general) (routine) without abnormal findings: Secondary | ICD-10-CM | POA: Diagnosis not present

## 2015-07-01 DIAGNOSIS — Z79890 Hormone replacement therapy (postmenopausal): Secondary | ICD-10-CM | POA: Diagnosis not present

## 2015-07-01 DIAGNOSIS — Z13820 Encounter for screening for osteoporosis: Secondary | ICD-10-CM

## 2015-07-01 MED ORDER — ESTROGENS CONJUGATED 0.625 MG PO TABS: 0.6250 mg | ORAL_TABLET | Freq: Every day | ORAL | Status: DC

## 2015-07-01 NOTE — Progress Notes (Signed)
Robin Arellano 12/29/1950 [id number]    History:    Presents for annual exam.  1994 TAH with BSO for endometriosis on Premarin 0.625. Has tried lower doses with numerous hot flashes and poor sleep. Has tried estradiol and had numerous hot flushes. Normal Pap history, mammograms several benign cyst has follow-up ultrasound scheduled in 6 months. History of a normal DEXA. Primary care manages labs and osteoarthritis. Has never had a colonoscopy. Current on vaccines.  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  ROS:  A ROS was performed and pertinent positives and negatives are included.  Exam:  Filed Vitals:   07/01/15 1612  BP: 121/76    General appearance:  Normal Thyroid:  Symmetrical, normal in size, without palpable masses or nodularity. Respiratory  Auscultation:  Clear without wheezing or rhonchi Cardiovascular  Auscultation:  Regular rate, without rubs, murmurs or gallops  Edema/varicosities:  Not grossly evident Abdominal  Soft,nontender, without masses, guarding or rebound.  Liver/spleen:  No organomegaly noted  Hernia:  None appreciated  Skin  Inspection:  Grossly normal   Breasts: Examined lying and sitting.     Right: Without masses, retractions, discharge or axillary adenopathy.     Left: Without masses, retractions, discharge or axillary adenopathy. Gentitourinary   Inguinal/mons:  Normal without inguinal adenopathy  External genitalia:  Normal  BUS/Urethra/Skene's glands:  Normal  Vagina: Atrophic  Cervix: And uterus absent Adnexa/parametria:     Rt: Without masses or tenderness.   Lt: Without masses or tenderness.  Anus and perineum: Normal  Digital rectal exam: Normal sphincter tone without palpated masses or tenderness  Assessment/Plan:  64 y.o. MWF G2 P2 for annual exam.     94 TAH with BSO on  Premarin Normal DEXA history Osteoarthritis/fibromyalgia -  primary care manages labs and meds  Plan: Long discussion on risks of HRT, women's health initiative, risks of blood clots, strokes, breast cancer and importance of being on shortest amount of time. States cannot tolerate no HRT. Will try to cut tablets in half and gradually wean down to quit. Prescription for Premarin 0.625 daily prescription, proper use given and reviewed. Will call if unable to tolerate decreasing dose after weather cooler. SBE's, continue annual 3-D mammogram, and a keep scheduled follow-up ultrasound. Continue regular exercise, cutting calories is down 10 pounds. Schedule DEXA, home safety, fall prevention and importance of weightbearing exercise reviewed. Never had a colonoscopy, Robin Arellano information given and reviewed importance of screening colonoscopy will schedule. UAHuel Arellano Robin Arellano, 4:52 PM 07/01/2015

## 2015-12-26 DIAGNOSIS — M5136 Other intervertebral disc degeneration, lumbar region: Secondary | ICD-10-CM | POA: Diagnosis not present

## 2015-12-26 DIAGNOSIS — M545 Low back pain: Secondary | ICD-10-CM | POA: Diagnosis not present

## 2016-01-03 DIAGNOSIS — N6001 Solitary cyst of right breast: Secondary | ICD-10-CM | POA: Diagnosis not present

## 2016-01-05 ENCOUNTER — Encounter: Payer: Self-pay | Admitting: Women's Health

## 2016-01-23 DIAGNOSIS — M62838 Other muscle spasm: Secondary | ICD-10-CM | POA: Diagnosis not present

## 2016-01-23 DIAGNOSIS — M791 Myalgia: Secondary | ICD-10-CM | POA: Diagnosis not present

## 2016-01-23 DIAGNOSIS — M545 Low back pain: Secondary | ICD-10-CM | POA: Diagnosis not present

## 2016-01-23 DIAGNOSIS — M5136 Other intervertebral disc degeneration, lumbar region: Secondary | ICD-10-CM | POA: Diagnosis not present

## 2016-02-17 ENCOUNTER — Telehealth: Payer: Self-pay | Admitting: *Deleted

## 2016-02-17 NOTE — Telephone Encounter (Signed)
Prior Authorization was completed for Premarin 0.625 mg tablet, medication was approved 02/16/16-11/11/16.

## 2016-04-18 DIAGNOSIS — M25551 Pain in right hip: Secondary | ICD-10-CM | POA: Diagnosis not present

## 2016-04-30 DIAGNOSIS — M5136 Other intervertebral disc degeneration, lumbar region: Secondary | ICD-10-CM | POA: Diagnosis not present

## 2016-05-31 DIAGNOSIS — M25551 Pain in right hip: Secondary | ICD-10-CM | POA: Diagnosis not present

## 2016-05-31 DIAGNOSIS — M62838 Other muscle spasm: Secondary | ICD-10-CM | POA: Diagnosis not present

## 2016-05-31 DIAGNOSIS — M5136 Other intervertebral disc degeneration, lumbar region: Secondary | ICD-10-CM | POA: Diagnosis not present

## 2016-06-21 ENCOUNTER — Other Ambulatory Visit: Payer: Self-pay | Admitting: Neurosurgery

## 2016-06-21 DIAGNOSIS — M5416 Radiculopathy, lumbar region: Secondary | ICD-10-CM

## 2016-06-30 ENCOUNTER — Ambulatory Visit
Admission: RE | Admit: 2016-06-30 | Discharge: 2016-06-30 | Disposition: A | Discharge status: Home / Self Care | Payer: PPO | Source: Ambulatory Visit | Attending: Neurosurgery | Admitting: Neurosurgery

## 2016-06-30 ENCOUNTER — Other Ambulatory Visit: Payer: Self-pay

## 2016-06-30 DIAGNOSIS — M5126 Other intervertebral disc displacement, lumbar region: Secondary | ICD-10-CM | POA: Diagnosis not present

## 2016-06-30 DIAGNOSIS — M5416 Radiculopathy, lumbar region: Secondary | ICD-10-CM

## 2016-07-09 DIAGNOSIS — M5416 Radiculopathy, lumbar region: Secondary | ICD-10-CM | POA: Diagnosis not present

## 2016-07-22 ENCOUNTER — Other Ambulatory Visit: Payer: Self-pay | Admitting: Gynecology

## 2016-07-22 DIAGNOSIS — Z79890 Hormone replacement therapy (postmenopausal): Secondary | ICD-10-CM

## 2016-07-22 DIAGNOSIS — Z01419 Encounter for gynecological examination (general) (routine) without abnormal findings: Secondary | ICD-10-CM

## 2016-07-31 ENCOUNTER — Other Ambulatory Visit: Payer: Self-pay | Admitting: Women's Health

## 2016-07-31 DIAGNOSIS — Z01419 Encounter for gynecological examination (general) (routine) without abnormal findings: Secondary | ICD-10-CM

## 2016-07-31 DIAGNOSIS — Z79890 Hormone replacement therapy (postmenopausal): Secondary | ICD-10-CM

## 2016-08-01 ENCOUNTER — Telehealth: Payer: Self-pay | Admitting: *Deleted

## 2016-08-01 DIAGNOSIS — Z01419 Encounter for gynecological examination (general) (routine) without abnormal findings: Secondary | ICD-10-CM

## 2016-08-01 DIAGNOSIS — Z79890 Hormone replacement therapy (postmenopausal): Secondary | ICD-10-CM

## 2016-08-01 MED ORDER — ESTROGENS CONJUGATED 0.625 MG PO TABS: 0.6250 mg | ORAL_TABLET | Freq: Every day | ORAL | 0 refills | Status: DC

## 2016-08-01 NOTE — Telephone Encounter (Signed)
Pt called has annual scheduled on 08/10/16 needs refill on premarin tablets. Rx sent with 0 refills

## 2016-08-09 DIAGNOSIS — M5416 Radiculopathy, lumbar region: Secondary | ICD-10-CM | POA: Diagnosis not present

## 2016-08-09 DIAGNOSIS — M5136 Other intervertebral disc degeneration, lumbar region: Secondary | ICD-10-CM | POA: Diagnosis not present

## 2016-08-10 ENCOUNTER — Ambulatory Visit (INDEPENDENT_AMBULATORY_CARE_PROVIDER_SITE_OTHER): Payer: PPO | Admitting: Women's Health

## 2016-08-10 ENCOUNTER — Encounter: Payer: Self-pay | Admitting: Women's Health

## 2016-08-10 VITALS — BP 119/78 | Ht 64.0 in | Wt 171.2 lb

## 2016-08-10 DIAGNOSIS — Z79890 Hormone replacement therapy (postmenopausal): Secondary | ICD-10-CM | POA: Diagnosis not present

## 2016-08-10 MED ORDER — ESTRADIOL 0.5 MG PO TABS: 0.5000 mg | ORAL_TABLET | Freq: Every day | ORAL | 12 refills | Status: DC

## 2016-08-10 NOTE — Patient Instructions (Addendum)
Menopause is a normal process in which your reproductive ability comes to an end. This process happens gradually over a span of months to years, usually between the ages of 48 and 55. Menopause is complete when you have missed 12 consecutive menstrual periods. It is important to talk with your health care provider about some of the most common conditions that affect postmenopausal women, such as heart disease, cancer, and bone loss (osteoporosis). Adopting a healthy lifestyle and getting preventive care can help to promote your health and wellness. Those actions can also lower your chances of developing some of these common conditions. WHAT SHOULD I KNOW ABOUT MENOPAUSE? During menopause, you may experience a number of symptoms, such as:  Moderate-to-severe hot flashes.  Night sweats.  Decrease in sex drive.  Mood swings.  Headaches.  Tiredness.  Irritability.  Memory problems.  Insomnia. Choosing to treat or not to treat menopausal changes is an individual decision that you make with your health care provider. WHAT SHOULD I KNOW ABOUT HORMONE REPLACEMENT THERAPY AND SUPPLEMENTS? Hormone therapy products are effective for treating symptoms that are associated with menopause, such as hot flashes and night sweats. Hormone replacement carries certain risks, especially as you become older. If you are thinking about using estrogen or estrogen with progestin treatments, discuss the benefits and risks with your health care provider. WHAT SHOULD I KNOW ABOUT HEART DISEASE AND STROKE? Heart disease, heart attack, and stroke become more likely as you age. This may be due, in part, to the hormonal changes that your body experiences during menopause. These can affect how your body processes dietary fats, triglycerides, and cholesterol. Heart attack and stroke are both medical emergencies. There are many things that you can do to help prevent heart disease and stroke:  Have your blood pressure  checked at least every 1-2 years. High blood pressure causes heart disease and increases the risk of stroke.  If you are 55-79 years old, ask your health care provider if you should take aspirin to prevent a heart attack or a stroke.  Do not use any tobacco products, including cigarettes, chewing tobacco, or electronic cigarettes. If you need help quitting, ask your health care provider.  It is important to eat a healthy diet and maintain a healthy weight.  Be sure to include plenty of vegetables, fruits, low-fat dairy products, and lean protein.  Avoid eating foods that are high in solid fats, added sugars, or salt (sodium).  Get regular exercise. This is one of the most important things that you can do for your health.  Try to exercise for at least 150 minutes each week. The type of exercise that you do should increase your heart rate and make you sweat. This is known as moderate-intensity exercise.  Try to do strengthening exercises at least twice each week. Do these in addition to the moderate-intensity exercise.  Know your numbers.Ask your health care provider to check your cholesterol and your blood glucose. Continue to have your blood tested as directed by your health care provider. WHAT SHOULD I KNOW ABOUT CANCER SCREENING? There are several types of cancer. Take the following steps to reduce your risk and to catch any cancer development as early as possible. Breast Cancer  Practice breast self-awareness.  This means understanding how your breasts normally appear and feel.  It also means doing regular breast self-exams. Let your health care provider know about any changes, no matter how small.  If you are 40 or older, have a clinician do a   breast exam (clinical breast exam or CBE) every year. Depending on your age, family history, and medical history, it may be recommended that you also have a yearly breast X-ray (mammogram).  If you have a family history of breast cancer,  talk with your health care provider about genetic screening.  If you are at high risk for breast cancer, talk with your health care provider about having an MRI and a mammogram every year.  Breast cancer (BRCA) gene test is recommended for women who have family members with BRCA-related cancers. Results of the assessment will determine the need for genetic counseling and BRCA1 and for BRCA2 testing. BRCA-related cancers include these types:  Breast. This occurs in males or females.  Ovarian.  Tubal. This may also be called fallopian tube cancer.  Cancer of the abdominal or pelvic lining (peritoneal cancer).  Prostate.  Pancreatic. Cervical, Uterine, and Ovarian Cancer Your health care provider may recommend that you be screened regularly for cancer of the pelvic organs. These include your ovaries, uterus, and vagina. This screening involves a pelvic exam, which includes checking for microscopic changes to the surface of your cervix (Pap test).  For women ages 21-65, health care providers may recommend a pelvic exam and a Pap test every three years. For women ages 77-65, they may recommend the Pap test and pelvic exam, combined with testing for human papilloma virus (HPV), every five years. Some types of HPV increase your risk of cervical cancer. Testing for HPV may also be done on women of any age who have unclear Pap test results.  Other health care providers may not recommend any screening for nonpregnant women who are considered low risk for pelvic cancer and have no symptoms. Ask your health care provider if a screening pelvic exam is right for you.  If you have had past treatment for cervical cancer or a condition that could lead to cancer, you need Pap tests and screening for cancer for at least 20 years after your treatment. If Pap tests have been discontinued for you, your risk factors (such as having a new sexual partner) need to be reassessed to determine if you should start having  screenings again. Some women have medical problems that increase the chance of getting cervical cancer. In these cases, your health care provider may recommend that you have screening and Pap tests more often.  If you have a family history of uterine cancer or ovarian cancer, talk with your health care provider about genetic screening.  If you have vaginal bleeding after reaching menopause, tell your health care provider.  There are currently no reliable tests available to screen for ovarian cancer. Lung Cancer Lung cancer screening is recommended for adults 3-70 years old who are at high risk for lung cancer because of a history of smoking. A yearly low-dose CT scan of the lungs is recommended if you:  Currently smoke.  Have a history of at least 30 pack-years of smoking and you currently smoke or have quit within the past 15 years. A pack-year is smoking an average of one pack of cigarettes per day for one year. Yearly screening should:  Continue until it has been 15 years since you quit.  Stop if you develop a health problem that would prevent you from having lung cancer treatment. Colorectal Cancer  This type of cancer can be detected and can often be prevented.  Routine colorectal cancer screening usually begins at age 38 and continues through age 12.  If you have  risk factors for colon cancer, your health care provider may recommend that you be screened at an earlier age.  If you have a family history of colorectal cancer, talk with your health care provider about genetic screening.  Your health care provider may also recommend using home test kits to check for hidden blood in your stool.  A small camera at the end of a tube can be used to examine your colon directly (sigmoidoscopy or colonoscopy). This is done to check for the earliest forms of colorectal cancer.  Direct examination of the colon should be repeated every 5-10 years until age 27. However, if early forms of  precancerous polyps or small growths are found or if you have a family history or genetic risk for colorectal cancer, you may need to be screened more often. Skin Cancer  Check your skin from head to toe regularly.  Monitor any moles. Be sure to tell your health care provider:  About any new moles or changes in moles, especially if there is a change in a mole's shape or color.  If you have a mole that is larger than the size of a pencil eraser.  If any of your family members has a history of skin cancer, especially at a Vanna Shavers age, talk with your health care provider about genetic screening.  Always use sunscreen. Apply sunscreen liberally and repeatedly throughout the day.  Whenever you are outside, protect yourself by wearing long sleeves, pants, a wide-brimmed hat, and sunglasses. WHAT SHOULD I KNOW ABOUT OSTEOPOROSIS? Osteoporosis is a condition in which bone destruction happens more quickly than new bone creation. After menopause, you may be at an increased risk for osteoporosis. To help prevent osteoporosis or the bone fractures that can happen because of osteoporosis, the following is recommended:  If you are 41-50 years old, get at least 1,000 mg of calcium and at least 600 mg of vitamin D per day.  If you are older than age 72 but younger than age 67, get at least 1,200 mg of calcium and at least 600 mg of vitamin D per day.  If you are older than age 49, get at least 1,200 mg of calcium and at least 800 mg of vitamin D per day. Smoking and excessive alcohol intake increase the risk of osteoporosis. Eat foods that are rich in calcium and vitamin D, and do weight-bearing exercises several times each week as directed by your health care provider. WHAT SHOULD I KNOW ABOUT HOW MENOPAUSE AFFECTS [place]? Depression may occur at any age, but it is more common as you become older. Common symptoms of depression include:  Low or sad mood.  Changes in sleep patterns.  Changes  in appetite or eating patterns.  Feeling an overall lack of motivation or enjoyment of activities that you previously enjoyed.  Frequent crying spells. Talk with your health care provider if you think that you are experiencing depression. WHAT SHOULD I KNOW ABOUT IMMUNIZATIONS? It is important that you get and maintain your immunizations. These include:  Tetanus, diphtheria, and pertussis (Tdap) booster vaccine.  Influenza every year before the flu season begins.  Pneumonia vaccine.  Shingles vaccine. Your health care provider may also recommend other immunizations.   This information is not intended to replace advice given to you by your health care provider. Make sure you discuss any questions you have with your health care provider.   Document Released: 12/21/2005 Document Revised: 11/19/2014 Document Reviewed: 07/01/2014 Elsevier Interactive Patient Education 2016 Elsevier  Inc. Back Injury Prevention Back injuries can be very painful. They can also be difficult to heal. After having one back injury, you are more likely to injure your back again. It is important to learn how to avoid injuring or re-injuring your back. The following tips can help you to prevent a back injury. WHAT SHOULD I KNOW ABOUT PHYSICAL FITNESS?  Exercise for 30 minutes per day on most days of the week or as directed by your health care provider. Make sure to:  Do aerobic exercises, such as walking, jogging, biking, or swimming.  Do exercises that increase balance and strength, such as tai chi and yoga. These can decrease your risk of falling and injuring your back.  Do stretching exercises to help with flexibility.  Try to develop strong abdominal muscles. Your abdominal muscles provide a lot of the support that is needed by your back.  Maintain a healthy weight. This helps to decrease your risk of a back injury. WHAT SHOULD I KNOW ABOUT MY DIET?  Talk with your health care provider about your  overall diet. Take supplements and vitamins only as directed by your health care provider.  Talk with your health care provider about how much calcium and vitamin D you need each day. These nutrients help to prevent weakening of the bones (osteoporosis). Osteoporosis can cause broken (fractured) bones, which lead to back pain.  Include good sources of calcium in your diet, such as dairy products, green leafy vegetables, and products that have had calcium added to them (fortified).  Include good sources of vitamin D in your diet, such as milk and foods that are fortified with vitamin D. WHAT SHOULD I KNOW ABOUT MY POSTURE?  Sit up straight and stand up straight. Avoid leaning forward when you sit or hunching over when you stand.  Choose chairs that have good low-back (lumbar) support.  If you work at a desk, sit close to it so you do not need to lean over. Keep your chin tucked in. Keep your neck drawn back, and keep your elbows bent at a right angle. Your arms should look like the letter "L."  Sit high and close to the steering wheel when you drive. Add a lumbar support to your car seat, if needed.  Avoid sitting or standing in one position for very long. Take breaks to get up, stretch, and walk around at least one time every hour. Take breaks every hour if you are driving for long periods of time.  Sleep on your side with your knees slightly bent, or sleep on your back with a pillow under your knees. Do not lie on the front of your body to sleep. WHAT SHOULD I KNOW ABOUT LIFTING, TWISTING, AND REACHING? Lifting and Heavy Lifting  Avoid heavy lifting, especially repetitive heavy lifting. If you must do heavy lifting:  Stretch before lifting.  Work slowly.  Rest between lifts.  Use a tool such as a cart or a dolly to move objects if one is available.  Make several small trips instead of carrying one heavy load.  Ask for help when you need it, especially when moving big  objects.  Follow these steps when lifting:  Stand with your feet shoulder-width apart.  Get as close to the object as you can. Do not try to pick up a heavy object that is far from your body.  Use handles or lifting straps if they are available.  Bend at your knees. Squat down, but keep your heels off  the floor.  Keep your shoulders pulled back, your chin tucked in, and your back straight.  Lift the object slowly while you tighten the muscles in your legs, abdomen, and buttocks. Keep the object as close to the center of your body as possible.  Follow these steps when putting down a heavy load:  Stand with your feet shoulder-width apart.  Lower the object slowly while you tighten the muscles in your legs, abdomen, and buttocks. Keep the object as close to the center of your body as possible.  Keep your shoulders pulled back, your chin tucked in, and your back straight.  Bend at your knees. Squat down, but keep your heels off the floor.  Use handles or lifting straps if they are available. Twisting and Reaching  Avoid lifting heavy objects above your waist.  Do not twist at your waist while you are lifting or carrying a load. If you need to turn, move your feet.  Do not bend over without bending at your knees.  Avoid reaching over your head, across a table, or for an object on a high surface. WHAT ARE SOME OTHER TIPS?  Avoid wet floors and icy ground. Keep sidewalks clear of ice to prevent falls.  Do not sleep on a mattress that is too soft or too hard.  Keep items that are used frequently within easy reach.  Put heavier objects on shelves at waist level, and put lighter objects on lower or higher shelves.  Find ways to decrease your stress, such as exercise, massage, or relaxation techniques. Stress can build up in your muscles. Tense muscles are more vulnerable to injury.  Talk with your health care provider if you feel anxious or depressed. These conditions can make  back pain worse.  Wear flat heel shoes with cushioned soles.  Avoid sudden movements.  Use both shoulder straps when carrying a backpack.  Do not use any tobacco products, including cigarettes, chewing tobacco, or electronic cigarettes. If you need help quitting, ask your health care provider.   This information is not intended to replace advice given to you by your health care provider. Make sure you discuss any questions you have with your health care provider.   Document Released: 12/06/2004 Document Revised: 03/15/2015 Document Reviewed: 11/02/2014 Elsevier Interactive Patient Education [employer].

## 2016-08-10 NOTE — Progress Notes (Signed)
Robin Arellano February 26, 1951 [id number]    History:    Presents for breast and pelvic exam. 1994 TAH with BSO for endometriosis on Premarin. Has tried to lower dose of Premarin the past and has had numerous hot flushes. Normal Pap and mammogram history. Has had benign cysts right breast ultrasound scheduled follow-up. Had normal DEXA at primary care. Overdue for colonoscopy . Declines vaccines of shingles and Zostavax. Currently struggling with back pain orthopedist managing reports will probably need surgery. Osteoarthritis and fibromyalgia.   Past medical history, past surgical history, family history and social history were all reviewed and documented in the EPIC chart. Retired [employer].  ROS:  A ROS was performed and pertinent positives and negatives are included.  Exam:  Vitals:   08/10/16 1543  BP: 119/78  Weight: 171 lb 3.2 oz (77.7 kg)  Height: 5\' 4"  (1.626 m)   Body mass index is 29.39 kg/m.   General appearance:  Normal Thyroid:  Symmetrical, normal in size, without palpable masses or nodularity. Respiratory  Auscultation:  Clear without wheezing or rhonchi Cardiovascular  Auscultation:  Regular rate, without rubs, murmurs or gallops  Edema/varicosities:  Not grossly evident Abdominal  Soft,nontender, without masses, guarding or rebound.  Liver/spleen:  No organomegaly noted  Hernia:  None appreciated  Skin  Inspection:  Grossly normal   Breasts: Examined lying and sitting.     Right: Without masses, retractions, discharge or axillary adenopathy.     Left: Without masses, retractions, discharge or axillary adenopathy. Gentitourinary   Inguinal/mons:  Normal without inguinal adenopathy  External genitalia:  Normal  BUS/Urethra/Skene's glands:  Normal  Vagina:  Atrophic  Cervix:   and uterus absent  Adnexa/parametria:     Rt: Without masses or tenderness.   Lt: Without masses or tenderness.  Anus and perineum: Normal  Digital rectal exam: Normal sphincter tone  without palpated masses or tenderness  Assessment/Plan:  65 y.o. MWF G2 P2 for breast and pelvic exam.  1994 TAH with BSO for endometriosis on HRT Chronic back pain-orthopedist managing Osteoarthritis and fibromyalgia-primary care managing labs and meds  Plan: HRT reviewed risks of blood clots, strokes and breast cancer, will try estradiol 0.5 by mouth daily prescription, proper use given and reviewed encouraged to decrease dose and stop this year. Instructed to call if problems. SBE's, continue annual 3-D screening mammogram history of benign breast cysts. Home safety, fall prevention and importance of weightbearing exercise which is limited due to chronic back pain reviewed. Zostavax and Pneumovax encouraged, declines. Calcium rich diet, vitamin D 1000 daily encouraged. Instructed to have vitamin D level checked at next blood draw at primary care. Continue vaginal lubricants with intercourse, Replens encouraged. Has rare intercourse.    Huel Cote [facility], 4:38 PM 08/10/2016

## 2016-08-14 ENCOUNTER — Telehealth: Payer: Self-pay | Admitting: *Deleted

## 2016-08-14 NOTE — Telephone Encounter (Signed)
PA done online for estradiol 0.5 mg tablet,medication approved via [place] until 11/11/2017

## 2016-08-29 ENCOUNTER — Other Ambulatory Visit: Payer: Self-pay | Admitting: Women's Health

## 2016-08-29 DIAGNOSIS — Z79890 Hormone replacement therapy (postmenopausal): Secondary | ICD-10-CM

## 2016-08-29 DIAGNOSIS — Z01419 Encounter for gynecological examination (general) (routine) without abnormal findings: Secondary | ICD-10-CM

## 2016-08-31 ENCOUNTER — Other Ambulatory Visit: Payer: Self-pay | Admitting: Women's Health

## 2016-08-31 DIAGNOSIS — Z79890 Hormone replacement therapy (postmenopausal): Secondary | ICD-10-CM

## 2016-08-31 DIAGNOSIS — Z01419 Encounter for gynecological examination (general) (routine) without abnormal findings: Secondary | ICD-10-CM

## 2016-09-03 ENCOUNTER — Telehealth: Payer: Self-pay | Admitting: *Deleted

## 2016-09-03 DIAGNOSIS — Z79890 Hormone replacement therapy (postmenopausal): Secondary | ICD-10-CM

## 2016-09-03 MED ORDER — ESTRADIOL 0.5 MG PO TABS: 0.5000 mg | ORAL_TABLET | Freq: Every day | ORAL | 11 refills | Status: DC

## 2016-09-03 NOTE — Telephone Encounter (Signed)
Pharmacy called because Rx for premarin tablets was denied, per note on 08/10/16 pt will try estradiol 0.5 mg tablets for hot flashes. Pharmacy states they never received the estradiol pill Rx. Rx re-sent.

## 2016-09-25 DIAGNOSIS — M5136 Other intervertebral disc degeneration, lumbar region: Secondary | ICD-10-CM | POA: Diagnosis not present

## 2016-09-25 DIAGNOSIS — M5416 Radiculopathy, lumbar region: Secondary | ICD-10-CM | POA: Diagnosis not present

## 2016-10-01 DIAGNOSIS — M542 Cervicalgia: Secondary | ICD-10-CM | POA: Diagnosis not present

## 2016-10-01 DIAGNOSIS — M5412 Radiculopathy, cervical region: Secondary | ICD-10-CM | POA: Diagnosis not present

## 2016-10-01 DIAGNOSIS — M5416 Radiculopathy, lumbar region: Secondary | ICD-10-CM | POA: Diagnosis not present

## 2016-10-01 DIAGNOSIS — M4316 Spondylolisthesis, lumbar region: Secondary | ICD-10-CM | POA: Diagnosis not present

## 2016-11-26 ENCOUNTER — Encounter: Payer: Self-pay | Admitting: Women's Health

## 2016-11-30 ENCOUNTER — Other Ambulatory Visit: Payer: Self-pay | Admitting: Women's Health

## 2016-12-03 ENCOUNTER — Telehealth: Payer: Self-pay | Admitting: *Deleted

## 2016-12-03 NOTE — Telephone Encounter (Signed)
Prior Authorization approved for estradiol 0.5 mg tablet until 11/11/17

## 2016-12-04 ENCOUNTER — Ambulatory Visit (INDEPENDENT_AMBULATORY_CARE_PROVIDER_SITE_OTHER): Payer: PPO | Admitting: Women's Health

## 2016-12-04 ENCOUNTER — Encounter: Payer: Self-pay | Admitting: Women's Health

## 2016-12-04 ENCOUNTER — Telehealth: Payer: Self-pay | Admitting: *Deleted

## 2016-12-04 VITALS — BP 112/74

## 2016-12-04 DIAGNOSIS — N63 Unspecified lump in unspecified breast: Secondary | ICD-10-CM

## 2016-12-04 MED ORDER — FLUCONAZOLE 150 MG PO TABS: 150.0000 mg | ORAL_TABLET | Freq: Once | ORAL | 1 refills | Status: DC

## 2016-12-04 NOTE — Telephone Encounter (Signed)
-----   Message from Huel Cote, NP sent at 12/04/2016 12:52 PM EST ----- Please schedule [place] with diagnostic mammogram at [facility], has been to [place], does not want to return there.  Any time ok.  Left breast 3 cm moble tender ? cyst at 2:00.  History of cysts, sister breast cancer at 80, will also need mammogram of rt breast

## 2016-12-04 NOTE — Telephone Encounter (Signed)
Pt informed with the below note. 

## 2016-12-04 NOTE — Progress Notes (Signed)
Presents with complaint of questionable left breast cyst for the last several days. States is painful to touch, has had some relief with vitamin E that she is been taking for the last few days. 06/2015 mammogram 3-D at [facility] (had a bad experience due to pain). Was noted to have a 7 mm oval right breast cyst and a 2.5 left breast cyst, had a follow-up on the right breast in 12/2015 that had decreased in size. Reports having numerous breast cysts over the years but states this breast cyst is more painful. Sister with breast cancer at age 68/survivor. 1994 TAH with BSO for endometriosis on estradiol  0.5, numerous hot flushes when off. Denies any breast injury, nipple discharge.  Exam: Appears uncomfortable, slightly worried. Breasts in sitting and lying position without visible retractions, dimpling, erythema. Left breast at 2:00 position 3 cm mobile smooth tender questionable cyst.  3 cm left breast mass/ Probable left breast cyst  Plan: Ultrasound and diagnostic mammogram to left breast at breast Center will schedule. Continue vitamin E.

## 2016-12-04 NOTE — Telephone Encounter (Signed)
Pt scheduled at the breast center on 12/13/15 @ 9:00am, will need to get flims from [facility] sent to breast center, left message for pt to call.

## 2016-12-12 ENCOUNTER — Other Ambulatory Visit: Payer: Self-pay | Admitting: Women's Health

## 2016-12-12 ENCOUNTER — Ambulatory Visit
Admission: RE | Admit: 2016-12-12 | Discharge: 2016-12-12 | Disposition: A | Discharge status: Home / Self Care | Payer: PPO | Source: Ambulatory Visit | Attending: Women's Health | Admitting: Women's Health

## 2016-12-12 ENCOUNTER — Encounter: Payer: Self-pay | Admitting: Women's Health

## 2016-12-12 DIAGNOSIS — N63 Unspecified lump in unspecified breast: Secondary | ICD-10-CM

## 2016-12-12 DIAGNOSIS — N6001 Solitary cyst of right breast: Secondary | ICD-10-CM | POA: Diagnosis not present

## 2016-12-12 DIAGNOSIS — N6002 Solitary cyst of left breast: Secondary | ICD-10-CM | POA: Diagnosis not present

## 2016-12-12 DIAGNOSIS — R928 Other abnormal and inconclusive findings on diagnostic imaging of breast: Secondary | ICD-10-CM | POA: Diagnosis not present

## 2016-12-12 IMAGING — MG 2D DIGITAL DIAGNOSTIC BILATERAL MAMMOGRAM WITH CAD AND ADJUNCT T
8 of 14 series · 8 of 34 positions shown · non-contrast
Comparison: Previous exam(s).

CLINICAL DATA: Patient complains of a palpable abnormality in the
left breast. History of breast cysts.

EXAM:
2D DIGITAL DIAGNOSTIC BILATERAL MAMMOGRAM WITH CAD AND ADJUNCT TOMO
ULTRASOUND BILATERAL BREAST

[L MLO]
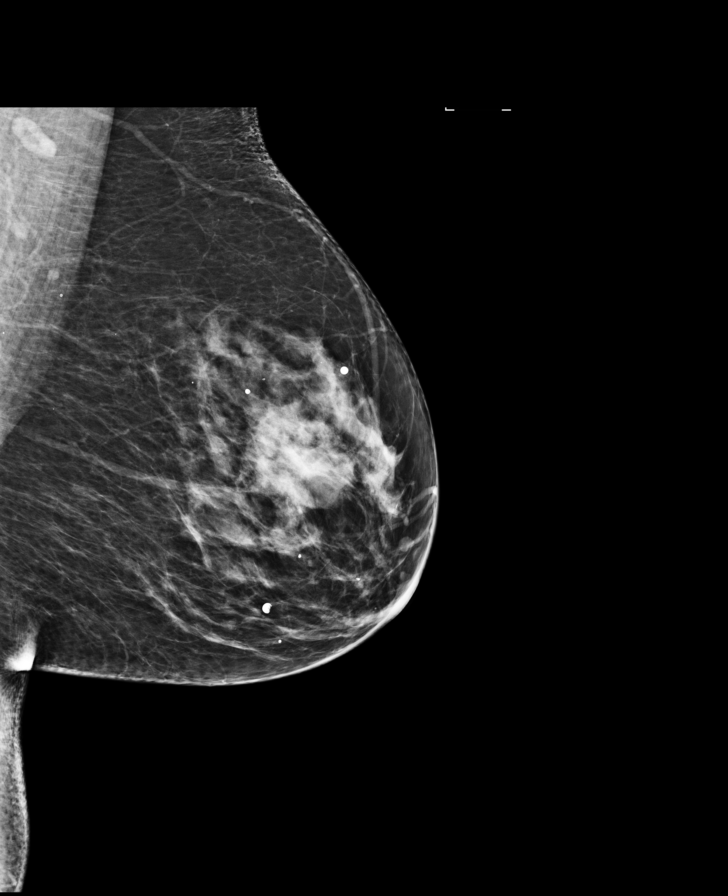

[L CC synth-2D]
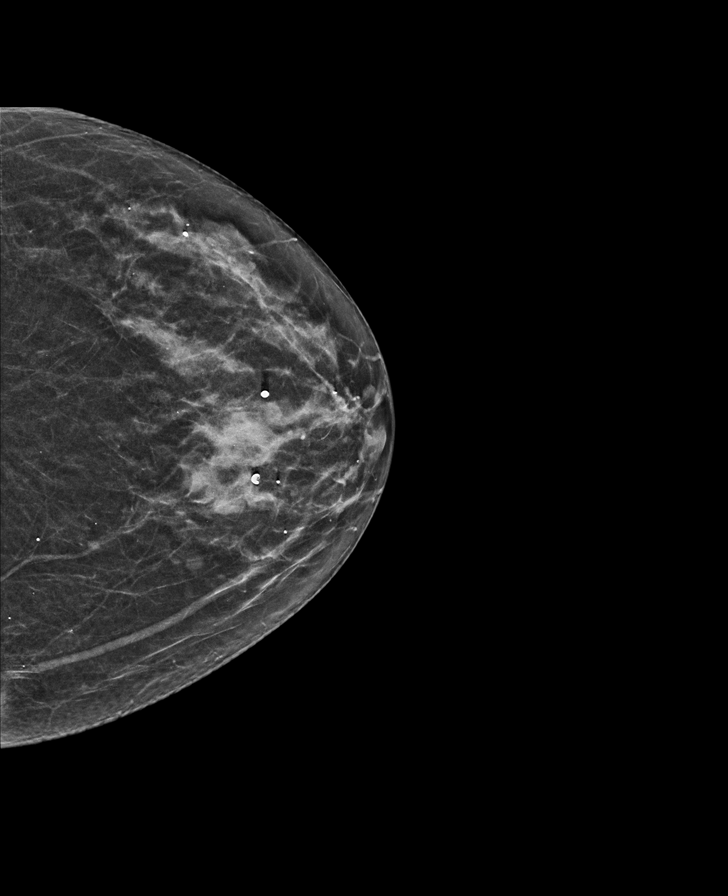

[L CC]
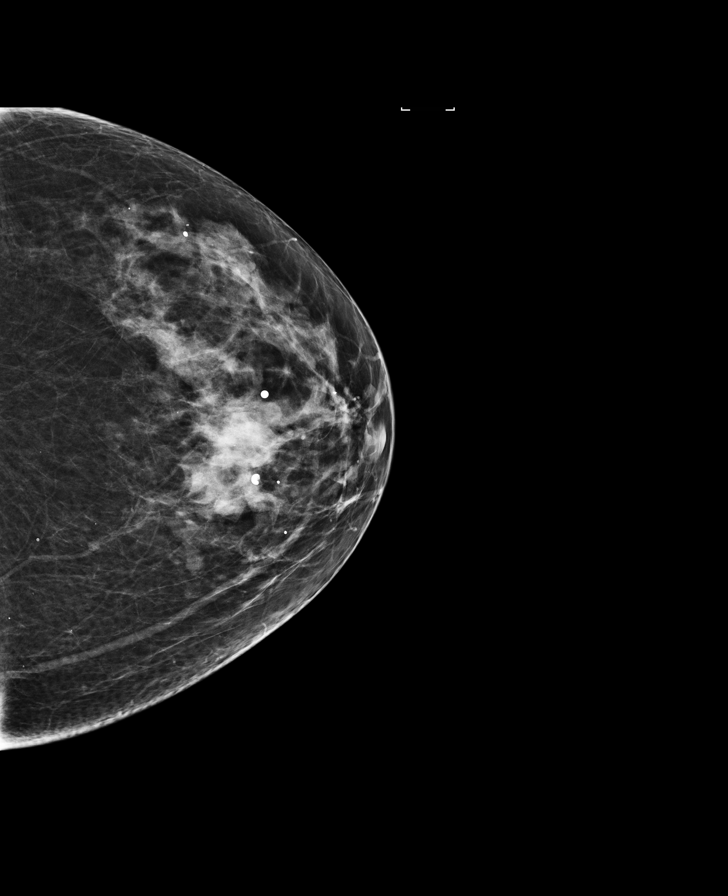

[R CC synth-2D]
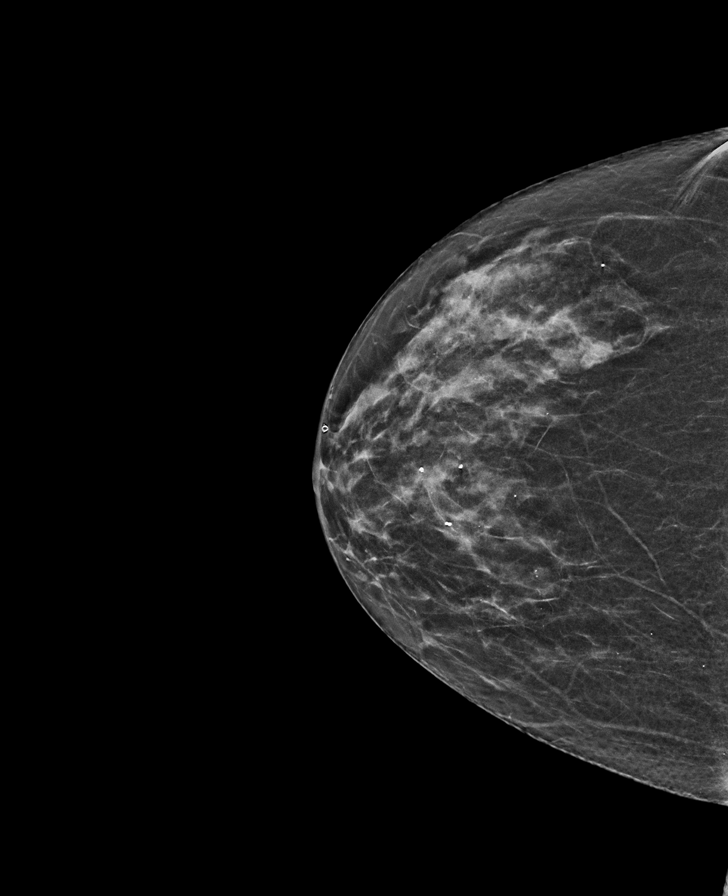

[L ML]
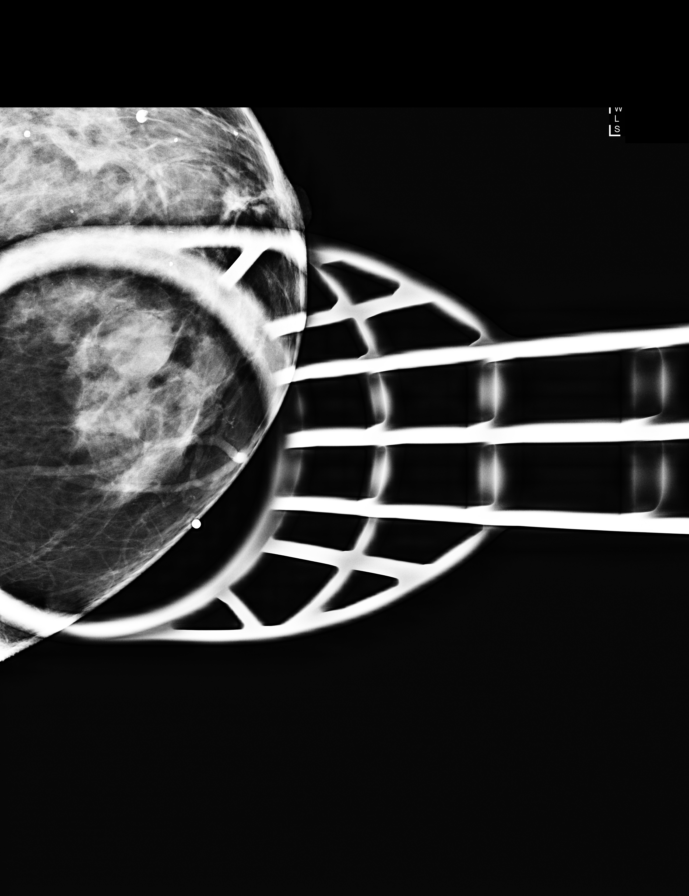

[R CC]
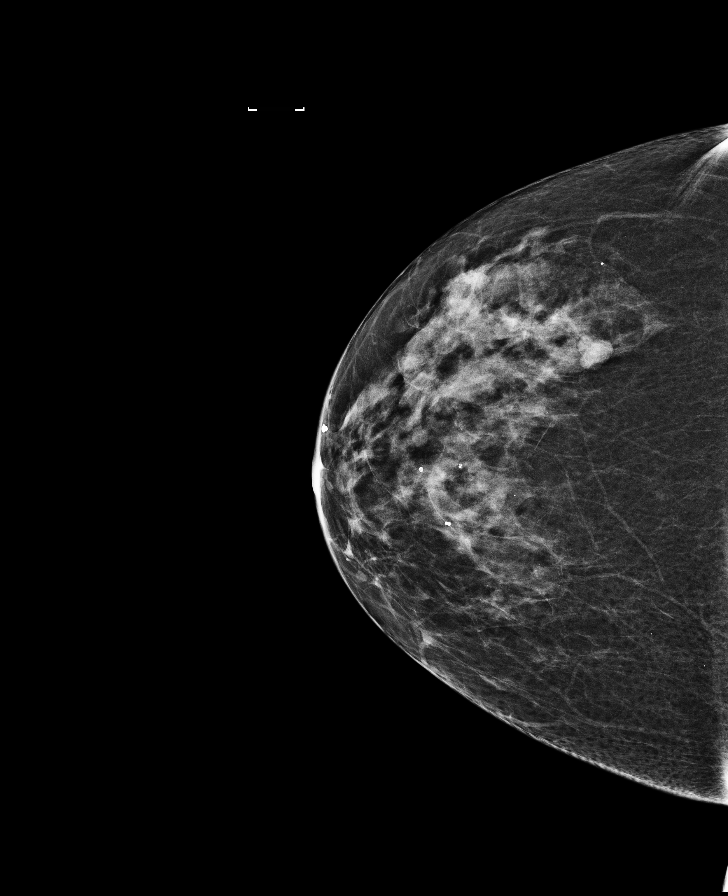

[R MLO synth-2D]
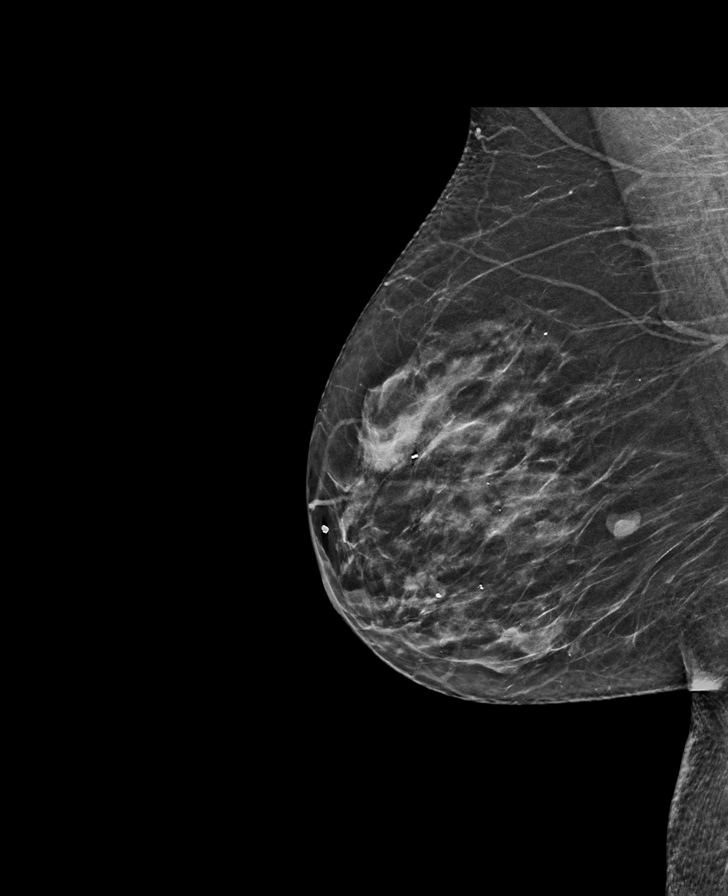

[L MLO synth-2D]
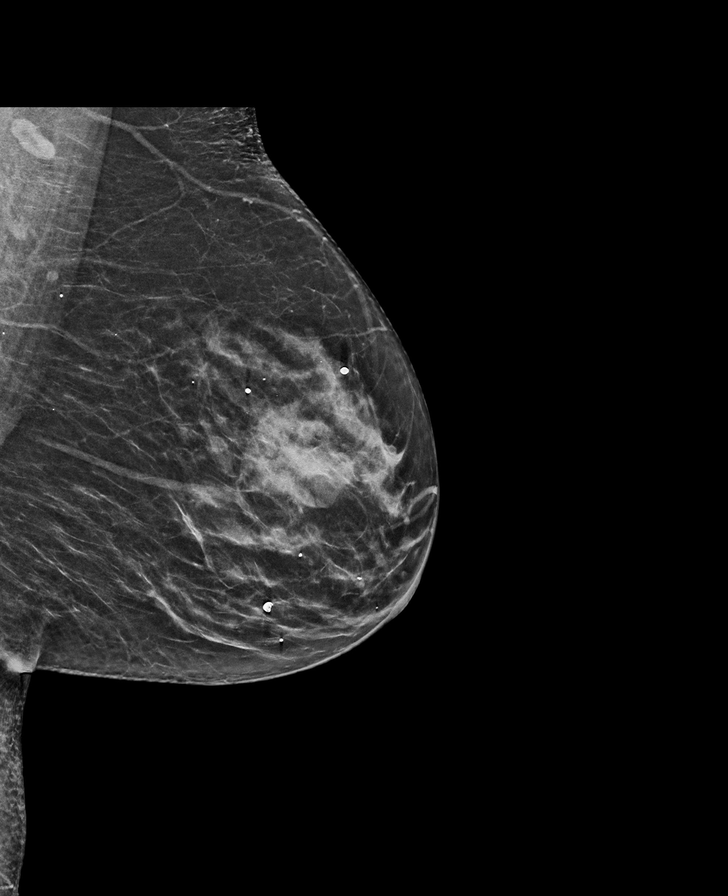

[8 of 34 positions shown; findings below may reference images not displayed]

ACR Breast Density Category b: There are scattered areas of
fibroglandular density.
FINDINGS: There is a well-circumscribed lobulated mass in the lateral aspect
of the right breast measuring 9 mm. This has previously been
ultrasounded and proven to be a cyst. There is an obscured 1.8 cm
mass in the upper aspect of the left breast. There are no malignant
type microcalcifications in either breast.

Mammographic images were processed with CAD.

On physical exam, I do not palpate a mass in the 8 o'clock region of
the right breast. I palpate mild thickening in the left breast at 1
o'clock 3 cm from the nipple.

Targeted ultrasound is performed, showing a cyst with thin benign
internal septations in the right breast at 8 o'clock 6 cm from the
nipple measuring 1.1 x 0.5 x 1.1 cm. Sonographic evaluation of the
left breast was performed showing a well-circumscribed cyst at 1
o'clock 3 cm from the nipple measuring 1.6 x 1.0 x 1.8 cm.
IMPRESSION: Bilateral breast cysts.  No evidence of malignancy in either breast.

RECOMMENDATION:
Bilateral screening mammogram in 1 year is recommended.

I have discussed the findings and recommendations with the patient.
Results were also provided in writing at the conclusion of the
visit. If applicable, a reminder letter will be sent to the patient
regarding the next appointment.

BI-RADS CATEGORY  2: Benign.

## 2016-12-12 IMAGING — US ULTRASOUND RIGHT BREAST LIMITED
1 series · 4 of 4 positions shown · non-contrast
Comparison: Previous exam(s).

CLINICAL DATA: Patient complains of a palpable abnormality in the
left breast. History of breast cysts.

EXAM:
2D DIGITAL DIAGNOSTIC BILATERAL MAMMOGRAM WITH CAD AND ADJUNCT TOMO
ULTRASOUND BILATERAL BREAST

[Series 1: ultrasound right breast limited · 0.05mm/px · 4 of 4 slices shown]
[im 1/4]
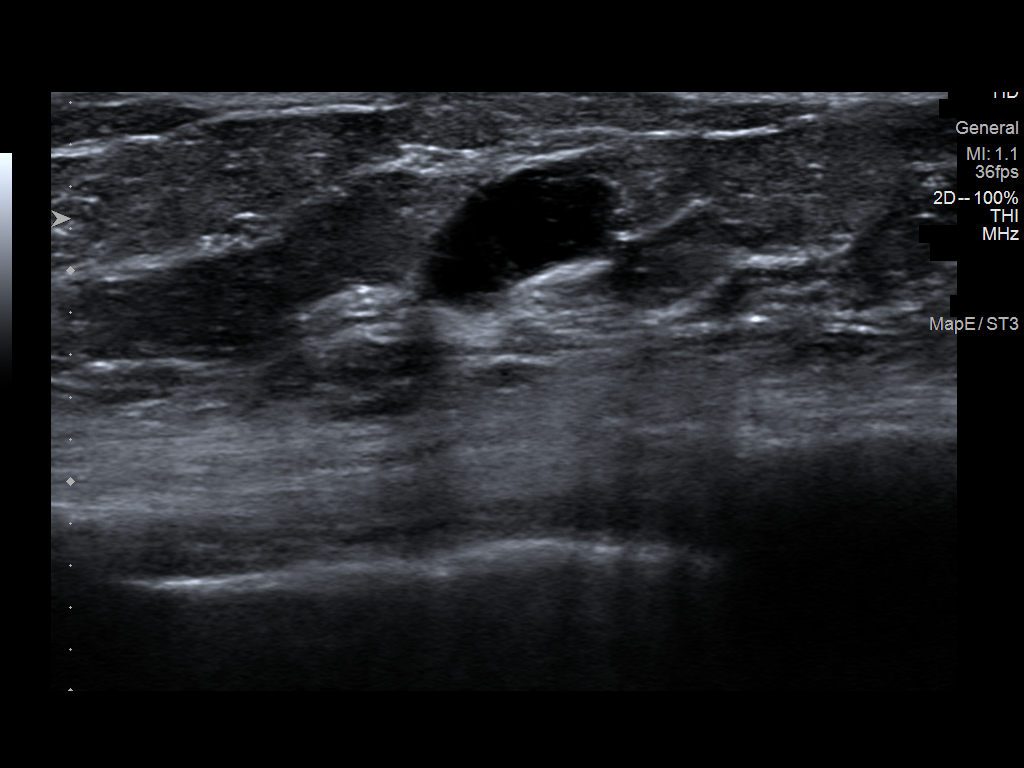
[im 2/4]
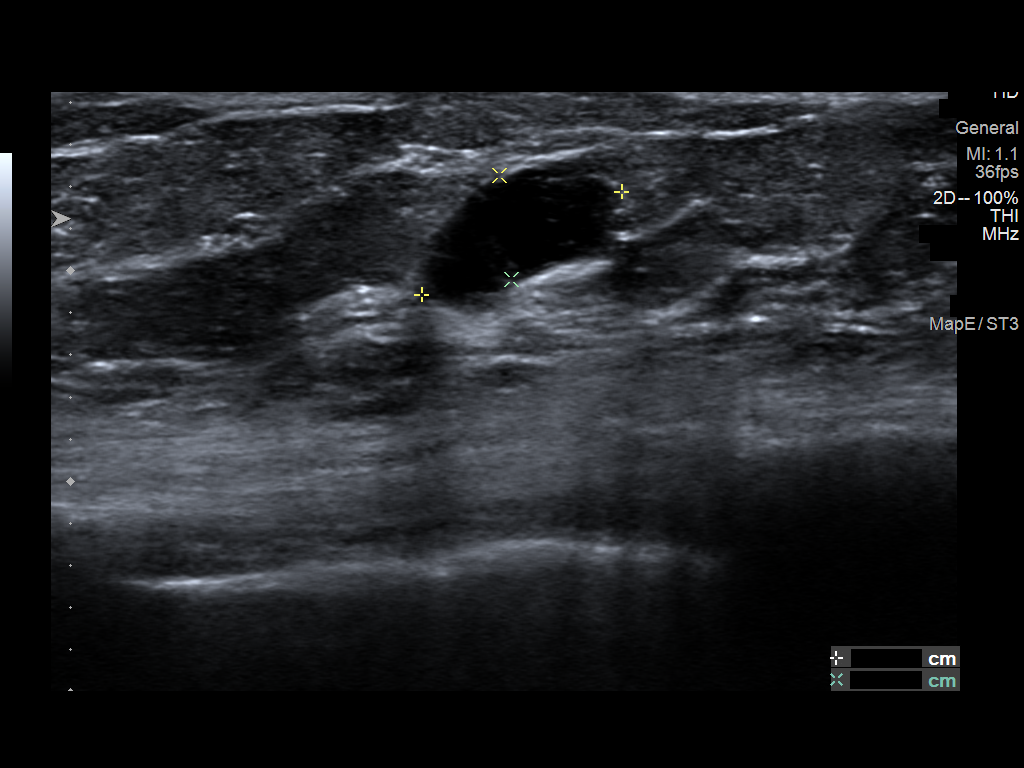
[im 3/4]
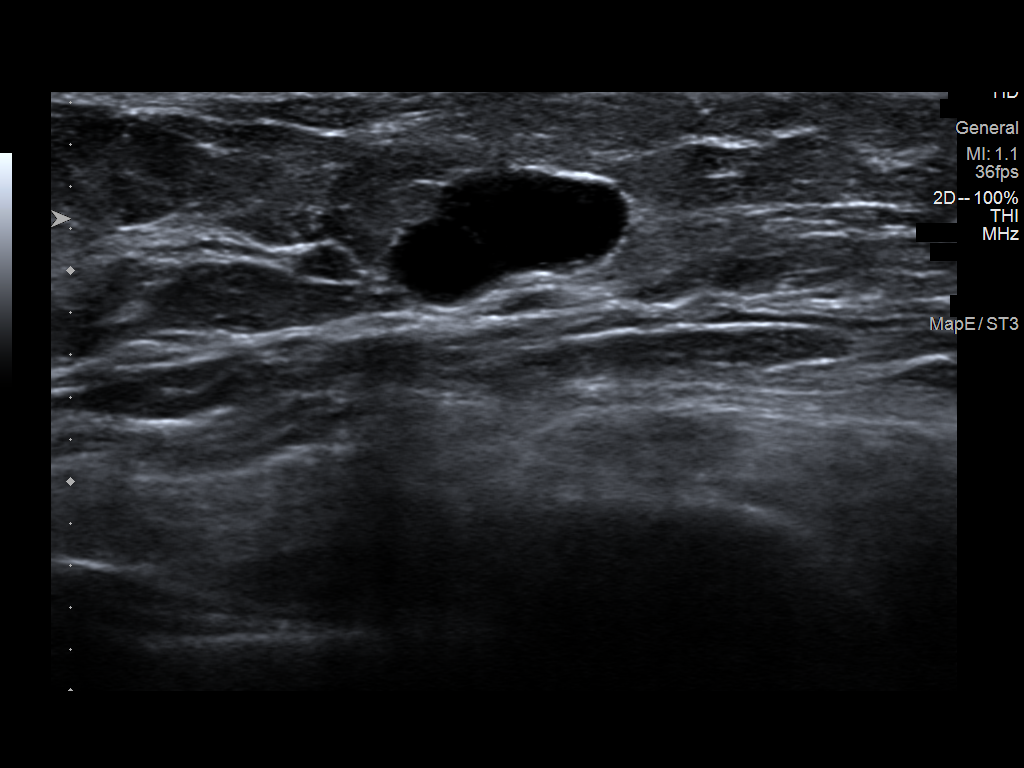
[im 4/4]
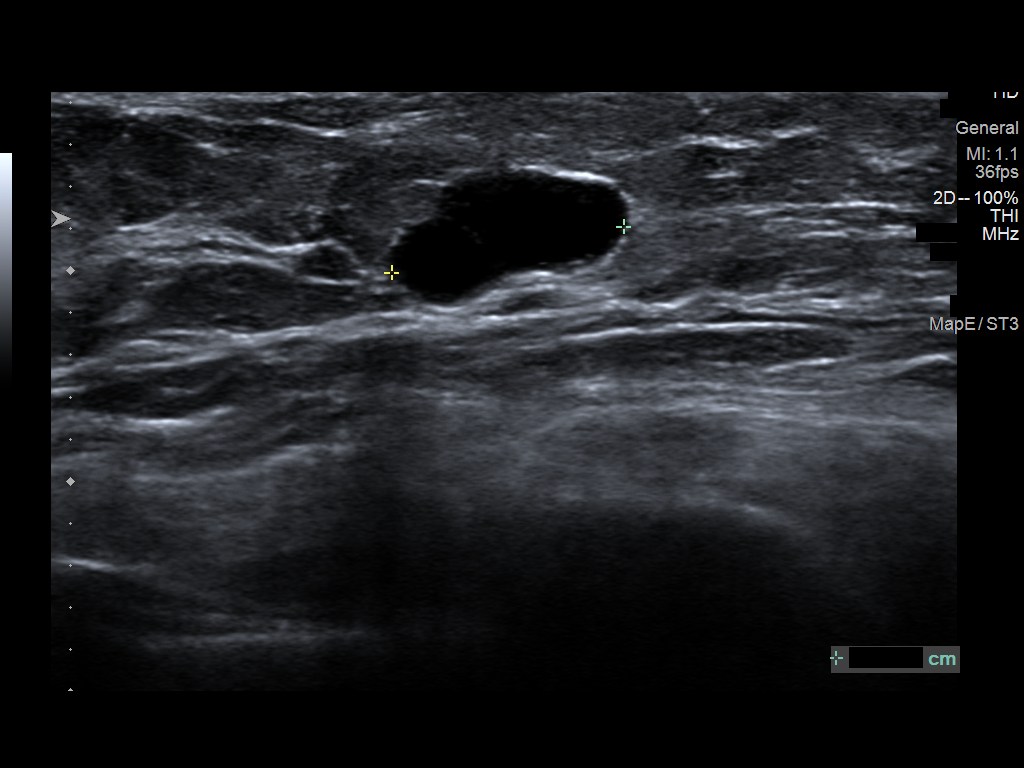

[4 of 4 positions shown; findings below may reference images not displayed]

ACR Breast Density Category b: There are scattered areas of
fibroglandular density.
FINDINGS: There is a well-circumscribed lobulated mass in the lateral aspect
of the right breast measuring 9 mm. This has previously been
ultrasounded and proven to be a cyst. There is an obscured 1.8 cm
mass in the upper aspect of the left breast. There are no malignant
type microcalcifications in either breast.

Mammographic images were processed with CAD.

On physical exam, I do not palpate a mass in the 8 o'clock region of
the right breast. I palpate mild thickening in the left breast at 1
o'clock 3 cm from the nipple.

Targeted ultrasound is performed, showing a cyst with thin benign
internal septations in the right breast at 8 o'clock 6 cm from the
nipple measuring 1.1 x 0.5 x 1.1 cm. Sonographic evaluation of the
left breast was performed showing a well-circumscribed cyst at 1
o'clock 3 cm from the nipple measuring 1.6 x 1.0 x 1.8 cm.
IMPRESSION: Bilateral breast cysts.  No evidence of malignancy in either breast.

RECOMMENDATION:
Bilateral screening mammogram in 1 year is recommended.

I have discussed the findings and recommendations with the patient.
Results were also provided in writing at the conclusion of the
visit. If applicable, a reminder letter will be sent to the patient
regarding the next appointment.

BI-RADS CATEGORY  2: Benign.

## 2016-12-20 DIAGNOSIS — M4317 Spondylolisthesis, lumbosacral region: Secondary | ICD-10-CM | POA: Diagnosis not present

## 2016-12-20 DIAGNOSIS — M5416 Radiculopathy, lumbar region: Secondary | ICD-10-CM | POA: Diagnosis not present

## 2017-01-17 DIAGNOSIS — M542 Cervicalgia: Secondary | ICD-10-CM | POA: Diagnosis not present

## 2017-01-17 DIAGNOSIS — M4316 Spondylolisthesis, lumbar region: Secondary | ICD-10-CM | POA: Diagnosis not present

## 2017-01-17 DIAGNOSIS — M4317 Spondylolisthesis, lumbosacral region: Secondary | ICD-10-CM | POA: Diagnosis not present

## 2017-01-17 DIAGNOSIS — M5412 Radiculopathy, cervical region: Secondary | ICD-10-CM | POA: Diagnosis not present

## 2017-03-21 DIAGNOSIS — M5416 Radiculopathy, lumbar region: Secondary | ICD-10-CM | POA: Diagnosis not present

## 2017-03-21 DIAGNOSIS — M4317 Spondylolisthesis, lumbosacral region: Secondary | ICD-10-CM | POA: Diagnosis not present

## 2017-03-27 ENCOUNTER — Encounter: Payer: Self-pay | Admitting: Gynecology

## 2017-04-22 DIAGNOSIS — M542 Cervicalgia: Secondary | ICD-10-CM | POA: Diagnosis not present

## 2017-04-22 DIAGNOSIS — Z6829 Body mass index (BMI) 29.0-29.9, adult: Secondary | ICD-10-CM | POA: Diagnosis not present

## 2017-04-22 DIAGNOSIS — M4317 Spondylolisthesis, lumbosacral region: Secondary | ICD-10-CM | POA: Diagnosis not present

## 2017-05-08 ENCOUNTER — Other Ambulatory Visit: Payer: Self-pay | Admitting: Women's Health

## 2017-05-08 NOTE — Telephone Encounter (Signed)
Okay for refill but office visit if no relief 

## 2017-05-23 ENCOUNTER — Emergency Department (HOSPITAL_COMMUNITY): Payer: PPO

## 2017-05-23 ENCOUNTER — Emergency Department (HOSPITAL_COMMUNITY)
Admission: EM | Admit: 2017-05-23 | Discharge: 2017-05-23 | Disposition: A | Discharge status: Home / Self Care | Payer: PPO | Attending: Emergency Medicine | Admitting: Emergency Medicine

## 2017-05-23 ENCOUNTER — Encounter (HOSPITAL_COMMUNITY): Payer: Self-pay | Admitting: Emergency Medicine

## 2017-05-23 DIAGNOSIS — D72829 Elevated white blood cell count, unspecified: Secondary | ICD-10-CM | POA: Insufficient documentation

## 2017-05-23 DIAGNOSIS — R1013 Epigastric pain: Secondary | ICD-10-CM | POA: Diagnosis not present

## 2017-05-23 DIAGNOSIS — R1011 Right upper quadrant pain: Secondary | ICD-10-CM | POA: Insufficient documentation

## 2017-05-23 DIAGNOSIS — Z79899 Other long term (current) drug therapy: Secondary | ICD-10-CM | POA: Insufficient documentation

## 2017-05-23 DIAGNOSIS — R079 Chest pain, unspecified: Secondary | ICD-10-CM | POA: Diagnosis not present

## 2017-05-23 DIAGNOSIS — Z91040 Latex allergy status: Secondary | ICD-10-CM | POA: Diagnosis not present

## 2017-05-23 DIAGNOSIS — Z87891 Personal history of nicotine dependence: Secondary | ICD-10-CM | POA: Insufficient documentation

## 2017-05-23 DIAGNOSIS — R072 Precordial pain: Secondary | ICD-10-CM

## 2017-05-23 HISTORY — DX: Acute myocardial infarction, unspecified: I21.9

## 2017-05-23 LAB — HEPATIC FUNCTION PANEL
ALT: 12 U/L — ABNORMAL LOW (ref 14–54)
AST: 21 U/L (ref 15–41)
Albumin: 4 g/dL (ref 3.5–5.0)
Alkaline Phosphatase: 84 U/L (ref 38–126)
Bilirubin, Direct: <0.1 mg/dL — ABNORMAL LOW (ref 0.1–0.5)
Total Bilirubin: 0.3 mg/dL (ref 0.3–1.2)
Total Protein: 7.8 g/dL (ref 6.5–8.1)

## 2017-05-23 LAB — URINALYSIS, ROUTINE W REFLEX MICROSCOPIC
Bilirubin Urine: NEGATIVE
Glucose, UA: NEGATIVE mg/dL
Hgb urine dipstick: NEGATIVE
Ketones, ur: NEGATIVE mg/dL
Leukocytes, UA: NEGATIVE
Nitrite: NEGATIVE
Protein, ur: NEGATIVE mg/dL
Specific Gravity, Urine: 1.013 (ref 1.005–1.030)
pH: 5 (ref 5.0–8.0)

## 2017-05-23 LAB — BASIC METABOLIC PANEL
Anion gap: 11 (ref 5–15)
BUN: 17 mg/dL (ref 6–20)
CO2: 23 mmol/L (ref 22–32)
Calcium: 9.4 mg/dL (ref 8.9–10.3)
Chloride: 103 mmol/L (ref 101–111)
Creatinine, Ser: 0.81 mg/dL (ref 0.44–1.00)
GFR calc Af Amer: >60 mL/min (ref >60)
GFR calc non Af Amer: >60 mL/min (ref >60)
Glucose, Bld: 103 mg/dL — ABNORMAL HIGH (ref 65–99)
Potassium: 4.1 mmol/L (ref 3.5–5.1)
Sodium: 137 mmol/L (ref 135–145)

## 2017-05-23 LAB — CBC
HCT: 38.4 % (ref 36.0–46.0)
Hemoglobin: 13 g/dL (ref 12.0–15.0)
MCH: 33.2 pg (ref 26.0–34.0)
MCHC: 33.9 g/dL (ref 30.0–36.0)
MCV: 98 fL (ref 78.0–100.0)
Platelets: 329 10*3/uL (ref 150–400)
RBC: 3.92 MIL/uL (ref 3.87–5.11)
RDW: 13.2 % (ref 11.5–15.5)
WBC: 15.6 10*3/uL — ABNORMAL HIGH (ref 4.0–10.5)

## 2017-05-23 LAB — POCT I-STAT TROPONIN I
Troponin i, poc: 0 ng/mL (ref 0.00–0.08)
Troponin i, poc: 0.01 ng/mL (ref 0.00–0.08)

## 2017-05-23 LAB — LIPASE, BLOOD: Lipase: 39 U/L (ref 11–51)

## 2017-05-23 MED ORDER — ACETAMINOPHEN 500 MG PO TABS
1000.0000 mg | ORAL_TABLET | Freq: Once | ORAL | Status: AC
  Administered 2017-05-23: 1000 mg via ORAL
  Filled 2017-05-23: qty 2

## 2017-05-23 MED ORDER — GI COCKTAIL ~~LOC~~: 30.0000 mL | Freq: Once | ORAL | Status: DC

## 2017-05-23 MED ORDER — PANTOPRAZOLE SODIUM 20 MG PO TBEC: 20.0000 mg | DELAYED_RELEASE_TABLET | Freq: Every day | ORAL | 0 refills | Status: DC

## 2017-05-23 MED ORDER — ASPIRIN 81 MG PO CHEW
324.0000 mg | CHEWABLE_TABLET | Freq: Once | ORAL | Status: DC
  Filled 2017-05-23: qty 4

## 2017-05-23 MED ORDER — PANTOPRAZOLE SODIUM 40 MG IV SOLR
40.0000 mg | Freq: Once | INTRAVENOUS | Status: AC
  Administered 2017-05-23: 40 mg via INTRAVENOUS
  Filled 2017-05-23: qty 40

## 2017-05-23 NOTE — ED Notes (Signed)
Lab notified of orders 

## 2017-05-23 NOTE — Discharge Instructions (Signed)
See her doctor for recheck next week. You will need a recheck of your blood count to see if her white blood cell count is still elevated. If your symptoms persist despite treatment with Protonix. You may need additional studies such as an upper endoscopy, a CT scan and/or consultation with a hematologist. Return to the emergency department if you develop fever, shortness of breath, worsening pain or other concerning symptoms.

## 2017-05-23 NOTE — ED Triage Notes (Signed)
Pt complaint of intermittent central chest sharpness with associated left arm pain and SOB onset 0230; awoke her from sleep; hx of MI.

## 2017-05-23 NOTE — ED Notes (Signed)
Requesting tylenol for pain

## 2017-05-23 NOTE — ED Provider Notes (Signed)
[facility] DEPT Provider Note   CSN: [id number] Arrival date & time: 05/23/17  5102     History   Chief Complaint Chief Complaint  Patient presents with  . Chest Pain    HPI Robin Arellano is a 66 y.o. female.  HPI Patient reports she's been getting intermittent chest pains for several weeks. They can occur at any time, either with activity or at rest. Not associated with activity. It has been somewhat variable location. Sometimes it's central and epigastric, other times it slightly to the left or the right. She reports the pain is a sharp aching quality. It has radiated to the right arm as well as the left arm. She reports yesterday she had dinner about 6 PM had gluten-free spaghetti and marinara sauce. Around 9:30 she started feeling short of breath. Reports that she was up for several hours and then went back to sleep. She reports at 2:30 she was awakened again and then had pain in her epigastric area radiating towards her left arm. She tried Gas-X and vinegar. Pain continued variably through the night. This morning she sought treatment. No recent cough or fever. No lower extremity swelling. No other general illness. Patient cannot associate it with food. Past Medical History:  Diagnosis Date  . Back pain   . Chronic female pelvic pain   . Fibromyalgia   . MI (myocardial infarction) ([place])   . Osteoarthritis   . Ovarian cyst, right   . Vitamin D deficiency     Patient Active Problem List   Diagnosis Date Noted  . Low back pain 07/09/2013  . Neck pain 07/09/2013  . Headache(784.0) 07/09/2013  . Fibromyalgia 07/09/2013    Past Surgical History:  Procedure Laterality Date  . ABDOMINAL HYSTERECTOMY  1994   [facility]  . KNEE SURGERY  1996  . PELVIC LAPAROSCOPY  1989   W LYSIS OF ADHESIONS/L SALPINGONEOSTOMY    OB History    Gravida Para Term Preterm AB Living   2 2       2    SAB TAB Ectopic Multiple Live Births                   Home  Medications    Prior to Admission medications   Medication Sig Start Date End Date Taking? Authorizing Provider  ALPRAZolam Duanne Moron) 0.5 MG tablet Take 0.5 mg by mouth at bedtime as needed for anxiety.  01/09/14  Yes [provider]  Ascorbic Acid (VITAMIN C) 1000 MG tablet Take 1,000 mg by mouth daily.   Yes [provider]  butalbital-acetaminophen-caffeine (FIORICET, ESGIC) 50-325-40 MG tablet Take 1 tablet by mouth every 4 (four) hours as needed for pain. 05/09/17  Yes [provider]  cholecalciferol (VITAMIN D) 1000 units tablet Take 1,000 Units by mouth daily.   Yes [provider]  Cyanocobalamin (VITAMIN B-12 PO) Take 1 mg by mouth See admin instructions. Takes 2 times a month   Yes [provider]  estradiol (ESTRACE) 0.5 MG tablet Take 1 tablet (0.5 mg total) by mouth daily. 09/03/16  Yes Huel Cote, NP  HYDROcodone-acetaminophen (NORCO) 10-325 MG tablet Take 0.5-1 tablets by mouth 2 (two) times daily as needed for pain. 05/12/17  Yes [provider]  vitamin E (VITAMIN E) 400 UNIT capsule Take 400 Units by mouth every Saturday.   Yes [provider]  zolpidem (AMBIEN) 10 MG tablet Take 10 mg by mouth at bedtime as needed.  01/09/14  Yes [provider]  fluconazole (DIFLUCAN) 150 MG tablet TAKE 1 TABLET(150 MG) BY MOUTH 1 TIME Patient not taking: Reported on 05/23/2017 05/08/17   Huel Cote, NP  pantoprazole (PROTONIX) 20 MG tablet Take 1 tablet (20 mg total) by mouth daily. 05/23/17   Charlesetta Shanks, MD  VITAMIN E PO Take by mouth.      [provider]    Family History Family History  Problem Relation Age of Onset  . Cancer Mother        UTERINE  . Aneurysm Father   . Heart disease Paternal Grandfather   . COPD Brother     Social History Social History  Substance Use Topics  . Smoking status: Former Smoker    Quit date: 11/12/2000  . Smokeless tobacco: Never Used  . Alcohol use No      Allergies   Prednisone and Latex   Review of Systems Review of Systems 10 Systems reviewed and are negative for acute change except as noted in the HPI.   Physical Exam Updated Vital Signs BP (!) 131/96 (BP Location: Right Arm)   Pulse 66   Temp 98.1 F (36.7 C) (Oral)   Resp 18   Ht 5' 3.5" (1.613 m)   Wt 74.8 kg (165 lb)   SpO2 100%   BMI 28.77 kg/m   Physical Exam  Constitutional: She is oriented to person, place, and time. She appears well-developed and well-nourished. No distress.  HENT:  Head: Normocephalic and atraumatic.  Eyes: Conjunctivae are normal.  Neck: Neck supple.  Cardiovascular: Normal rate and regular rhythm.   No murmur heard. Pulmonary/Chest: Effort normal and breath sounds normal. No respiratory distress.  Abdominal: Soft. She exhibits no distension. There is tenderness.  Patient has significant reproducible tenderness far right lateral quadrant and right upper quadrant. No mass or fullness.  Musculoskeletal: Normal range of motion. She exhibits no edema or tenderness.  Neurological: She is alert and oriented to person, place, and time. No cranial nerve deficit. She exhibits normal muscle tone. Coordination normal.  Skin: Skin is warm and dry.  Psychiatric: She has a normal mood and affect.  Nursing note and vitals reviewed.    ED Treatments / Results  Labs (all labs ordered are listed, but only abnormal results are displayed) Labs Reviewed  BASIC METABOLIC PANEL - Abnormal; Notable for the following:       Result Value   Glucose, Bld 103 (*)    All other components within normal limits  CBC - Abnormal; Notable for the following:    WBC 15.6 (*)    All other components within normal limits  HEPATIC FUNCTION PANEL - Abnormal; Notable for the following:    ALT 12 (*)    Bilirubin, Direct <0.1 (*)    All other components within normal limits  URINALYSIS, ROUTINE W REFLEX MICROSCOPIC - Abnormal; Notable for the following:     APPearance HAZY (*)    All other components within normal limits  LIPASE, BLOOD  I-STAT TROPOININ, ED  POCT I-STAT TROPONIN I  I-STAT TROPOININ, ED  POCT I-STAT TROPONIN I    EKG  EKG Interpretation  Date/Time:  Thursday May 23 2017 08:50:17 EDT Ventricular Rate:  65 PR Interval:    QRS Duration: 100 QT Interval:  415 QTC Calculation: 432 R Axis:   70 Text Interpretation:  Sinus rhythm normal. no old comparison Confirmed by Charlesetta Shanks [phone]) on 05/23/2017 10:49:50 AM       Radiology Dg Chest  2 View  Result Date: 05/23/2017 CLINICAL DATA:  Chest pain. EXAM: CHEST  2 VIEW COMPARISON:  Radiographs of January 15, 2006. FINDINGS: The heart size and mediastinal contours are within normal limits. Both lungs are clear. No pneumothorax or pleural effusion is noted. The visualized skeletal structures are unremarkable. IMPRESSION: No active cardiopulmonary disease. Electronically Signed   By: Marijo Conception, M.D.   On: 05/23/2017 09:12   US Abdomen Complete  Result Date: 05/23/2017 CLINICAL DATA:  Patient with chest pain for multiple weeks. Right upper quadrant pain. Nausea. EXAM: ABDOMEN ULTRASOUND COMPLETE COMPARISON:  None. FINDINGS: Gallbladder: No gallstones or wall thickening visualized. No sonographic Murphy sign noted by sonographer. Common bile duct: Diameter: 5.7 mm Liver: No focal lesion identified. Within normal limits in parenchymal echogenicity. IVC: No abnormality visualized. Pancreas: Visualized portion unremarkable. Spleen: Size and appearance within normal limits. Right Kidney: Length: 9.7 cm. Echogenicity within normal limits. No mass or hydronephrosis visualized. Left Kidney: Length: 9.8 cm. Echogenicity within normal limits. No mass or hydronephrosis visualized. Abdominal aorta: No aneurysm visualized. Other findings: None. IMPRESSION: No acute process within the abdomen. No cholelithiasis or sonographic evidence for acute cholecystitis. No hydronephrosis. Electronically  Signed   By: Lovey Newcomer M.D.   On: 05/23/2017 12:02    Procedures Procedures (including critical care time)  Medications Ordered in ED Medications  aspirin chewable tablet 324 mg (324 mg Oral Not Given 05/23/17 1106)  pantoprazole (PROTONIX) injection 40 mg (40 mg Intravenous Given 05/23/17 1106)  acetaminophen (TYLENOL) tablet 1,000 mg (1,000 mg Oral Given 05/23/17 1327)     Initial Impression / Assessment and Plan / ED Course  I have reviewed the triage vital signs and the nursing notes.  Pertinent labs & imaging results that were available during my care of the patient were reviewed by me and considered in my medical decision making (see chart for details).      Final Clinical Impressions(s) / ED Diagnoses   Final diagnoses:  Precordial chest pain  Epigastric pain  Leukocytosis, unspecified type   Patient having pain for several weeks. It is sporadic in nature. It does not have clearly ischemic type symptoms associated. 2 sets of cardiac enzymes are negative. EKG does not show ischemic change. I feel patient is appropriate for outpatient follow-up regarding further diagnostic cardiology evaluation if indicated. Patient does have a leukocytosis of unclear etiology. She did have reproducible pain in the epigastrium and right upper quadrant. She was somewhat suggestive of biliary colic with the sharp and sporadic nature pain. Ultrasound however does not show acute findings and LFTs are normal. Patient also has history of gastric ulcer. She however does not have anemia, history of bloody emesis. Patient did find her symptoms being significantly alleviated by Protonix. Will initiate daily Protonix. Patient is instructed on follow-up with her PCP for further diagnostic evaluation and referrals if leukocytosis persists and/or symptoms persist despite protonic. Return precautions are reviewed. New Prescriptions New Prescriptions   PANTOPRAZOLE (PROTONIX) 20 MG TABLET    Take 1 tablet (20 mg  total) by mouth daily.     Charlesetta Shanks, MD 05/23/17 [phone]

## 2017-06-14 ENCOUNTER — Encounter: Payer: Self-pay | Admitting: Gastroenterology

## 2017-07-11 DIAGNOSIS — M4317 Spondylolisthesis, lumbosacral region: Secondary | ICD-10-CM | POA: Diagnosis not present

## 2017-07-11 DIAGNOSIS — M5416 Radiculopathy, lumbar region: Secondary | ICD-10-CM | POA: Diagnosis not present

## 2017-07-30 ENCOUNTER — Ambulatory Visit (AMBULATORY_SURGERY_CENTER): Payer: Self-pay

## 2017-07-30 VITALS — Ht 63.0 in | Wt 169.4 lb

## 2017-07-30 DIAGNOSIS — Z1211 Encounter for screening for malignant neoplasm of colon: Secondary | ICD-10-CM

## 2017-07-30 MED ORDER — NA SULFATE-K SULFATE-MG SULF 17.5-3.13-1.6 GM/177ML PO SOLN: ORAL | 0 refills | Status: DC

## 2017-07-30 NOTE — Progress Notes (Signed)
Per pt, no allergies to soy or egg products.Pt not taking any weight loss meds or using  O2 at home.   Pt refused Emmi video. 

## 2017-08-01 ENCOUNTER — Encounter: Payer: Self-pay | Admitting: Gastroenterology

## 2017-08-07 DIAGNOSIS — M5412 Radiculopathy, cervical region: Secondary | ICD-10-CM | POA: Diagnosis not present

## 2017-08-07 DIAGNOSIS — I10 Essential (primary) hypertension: Secondary | ICD-10-CM | POA: Diagnosis not present

## 2017-08-07 DIAGNOSIS — M542 Cervicalgia: Secondary | ICD-10-CM | POA: Diagnosis not present

## 2017-08-07 DIAGNOSIS — M4317 Spondylolisthesis, lumbosacral region: Secondary | ICD-10-CM | POA: Diagnosis not present

## 2017-08-12 ENCOUNTER — Encounter: Payer: Self-pay | Admitting: Gastroenterology

## 2017-08-12 ENCOUNTER — Ambulatory Visit (AMBULATORY_SURGERY_CENTER): Payer: PPO | Admitting: Gastroenterology

## 2017-08-12 VITALS — BP 140/63 | HR 59 | Temp 97.7°F | Resp 15 | Ht 63.0 in | Wt 169.0 lb

## 2017-08-12 DIAGNOSIS — Z1211 Encounter for screening for malignant neoplasm of colon: Secondary | ICD-10-CM

## 2017-08-12 DIAGNOSIS — D122 Benign neoplasm of ascending colon: Secondary | ICD-10-CM | POA: Diagnosis not present

## 2017-08-12 DIAGNOSIS — K219 Gastro-esophageal reflux disease without esophagitis: Secondary | ICD-10-CM | POA: Diagnosis not present

## 2017-08-12 DIAGNOSIS — M797 Fibromyalgia: Secondary | ICD-10-CM | POA: Diagnosis not present

## 2017-08-12 DIAGNOSIS — K5730 Diverticulosis of large intestine without perforation or abscess without bleeding: Secondary | ICD-10-CM | POA: Diagnosis not present

## 2017-08-12 HISTORY — PX: COLONOSCOPY: SHX174

## 2017-08-12 MED ORDER — SODIUM CHLORIDE 0.9 % IV SOLN: 500.0000 mL | INTRAVENOUS | Status: DC

## 2017-08-12 NOTE — Op Note (Signed)
[facility] Patient Name: Robin Arellano Procedure Date: 08/12/2017 10:22 AM MRN: [id number] Endoscopist: Milus Banister , MD Age: 66 Referring MD:  Date of Birth: October 28, 1951 Gender: Female Account #: [id number] Procedure:                Colonoscopy Indications:              Screening for colorectal malignant neoplasm Medicines:                Monitored Anesthesia Care Procedure:                Pre-Anesthesia Assessment:                           - Prior to the procedure, a History and Physical                            was performed, and patient medications and                            allergies were reviewed. The patient's tolerance of                            previous anesthesia was also reviewed. The risks                            and benefits of the procedure and the sedation                            options and risks were discussed with the patient.                            All questions were answered, and informed consent                            was obtained. Prior Anticoagulants: The patient has                            taken no previous anticoagulant or antiplatelet                            agents. ASA Grade Assessment: II - A patient with                            mild systemic disease. After reviewing the risks                            and benefits, the patient was deemed in                            satisfactory condition to undergo the procedure.                           After obtaining informed consent, the colonoscope  was passed under direct vision. Throughout the                            procedure, the patient's blood pressure, pulse, and                            oxygen saturations were monitored continuously. The                            Colonoscope was introduced through the anus and                            advanced to the the cecum, identified by                            appendiceal orifice and  ileocecal valve. The                            colonoscopy was performed without difficulty. The                            patient tolerated the procedure well. The quality                            of the bowel preparation was good. The ileocecal                            valve, appendiceal orifice, and rectum were                            photographed. Scope In: 10:23:11 AM Scope Out: 10:37:56 AM Scope Withdrawal Time: 0 hours 10 minutes 50 seconds  Total Procedure Duration: 0 hours 14 minutes 45 seconds  Findings:                 A 14 mm polyp was found in the ascending colon. The                            polyp was sessile. The polyp was removed in                            piecemeal fashion with mixed cautery/cold snare.                            Resection and retrieval were complete.                           A few small-mouthed diverticula were found in the                            left colon.                           The exam was otherwise without abnormality on  direct and retroflexion views. Complications:            No immediate complications. Estimated blood loss:                            None. Estimated Blood Loss:     Estimated blood loss: none. Impression:               - One 14 mm polyp in the ascending colon, removed                            in piecemeal fashion (mixed cautery/cold snare).                            Resected and retrieved.                           - Diverticulosis in the left colon.                           - The examination was otherwise normal on direct                            and retroflexion views. Recommendation:           - Patient has a contact number available for                            emergencies. The signs and symptoms of potential                            delayed complications were discussed with the                            patient. Return to normal activities tomorrow.                             Written discharge instructions were provided to the                            patient.                           - Resume previous diet.                           - Continue present medications.                           - Repeat colonoscopy is recommended. The                            colonoscopy date will be determined after pathology                            results from today's exam become available for  review (likely 6 months) Milus Banister, MD 08/12/2017 10:40:45 AM This report has been signed electronically.

## 2017-08-12 NOTE — Progress Notes (Signed)
Report to PACU, RN, vss, BBS= Clear.  

## 2017-08-12 NOTE — Progress Notes (Signed)
Called to room to assist during endoscopic procedure.  Patient ID and intended procedure confirmed with present staff. Received instructions for my participation in the procedure from the performing physician.  

## 2017-08-12 NOTE — Patient Instructions (Signed)
YOU HAD AN ENDOSCOPIC PROCEDURE TODAY AT THE [facility]:   Refer to the procedure report that was given to you for any specific questions about what was found during the examination.  If the procedure report does not answer your questions, please call your gastroenterologist to clarify.  If you requested that your care partner not be given the details of your procedure findings, then the procedure report has been included in a sealed envelope for you to review at your convenience later.  YOU SHOULD EXPECT: Some feelings of bloating in the abdomen. Passage of more gas than usual.  Walking can help get rid of the air that was put into your GI tract during the procedure and reduce the bloating. If you had a lower endoscopy (such as a colonoscopy or flexible sigmoidoscopy) you may notice spotting of blood in your stool or on the toilet paper. If you underwent a bowel prep for your procedure, you may not have a normal bowel movement for a few days.  Please Note:  You might notice some irritation and congestion in your nose or some drainage.  This is from the oxygen used during your procedure.  There is no need for concern and it should clear up in a day or so.  SYMPTOMS TO REPORT IMMEDIATELY:   Following lower endoscopy (colonoscopy or flexible sigmoidoscopy):  Excessive amounts of blood in the stool  Significant tenderness or worsening of abdominal pains  Swelling of the abdomen that is new, acute  Fever of 100F or higher    For urgent or emergent issues, a gastroenterologist can be reached at any hour by calling [phone].   DIET:  We do recommend a small meal at first, but then you may proceed to your regular diet.  Drink plenty of fluids but you should avoid alcoholic beverages for 24 hours.  ACTIVITY:  You should plan to take it easy for the rest of today and you should NOT DRIVE or use heavy machinery until tomorrow (because of the sedation medicines used during the test).     FOLLOW UP: Our staff will call the number listed on your records the next business day following your procedure to check on you and address any questions or concerns that you may have regarding the information given to you following your procedure. If we do not reach you, we will leave a message.  However, if you are feeling well and you are not experiencing any problems, there is no need to return our call.  We will assume that you have returned to your regular daily activities without incident.  If any biopsies were taken you will be contacted by phone or by letter within the next 1-3 weeks.  Please call us at [phone] if you have not heard about the biopsies in 3 weeks.    SIGNATURES/CONFIDENTIALITY: You and/or your care partner have signed paperwork which will be entered into your electronic medical record.  These signatures attest to the fact that that the information above on your After Visit Summary has been reviewed and is understood.  Full responsibility of the confidentiality of this discharge information lies with you and/or your care-partner.   Resume medications. Information given on polyps and diverticulosis. 

## 2017-08-13 ENCOUNTER — Telehealth: Payer: Self-pay

## 2017-08-13 NOTE — Telephone Encounter (Signed)
  Follow up Call-  Call back number 08/12/2017  Post procedure Call Back phone  # [phone]  Permission to leave phone message Yes  Some recent data might be hidden     Patient questions:  Do you have a fever, pain , or abdominal swelling? No. Pain Score  0 *  Have you tolerated food without any problems? Yes.    Have you been able to return to your normal activities? Yes.    Do you have any questions about your discharge instructions: Diet   No. Medications  No. Follow up visit  No.  Do you have questions or concerns about your Care? No.  Actions: * If pain score is 4 or above: No action needed, pain <4.

## 2017-08-16 ENCOUNTER — Encounter: Payer: Self-pay | Admitting: Gastroenterology

## 2017-09-01 ENCOUNTER — Other Ambulatory Visit: Payer: Self-pay | Admitting: Women's Health

## 2017-09-01 DIAGNOSIS — Z79890 Hormone replacement therapy (postmenopausal): Secondary | ICD-10-CM

## 2017-09-09 DIAGNOSIS — H43313 Vitreous membranes and strands, bilateral: Secondary | ICD-10-CM | POA: Diagnosis not present

## 2017-10-10 DIAGNOSIS — M545 Low back pain: Secondary | ICD-10-CM | POA: Diagnosis not present

## 2017-10-10 DIAGNOSIS — M4317 Spondylolisthesis, lumbosacral region: Secondary | ICD-10-CM | POA: Diagnosis not present

## 2017-11-12 HISTORY — PX: ANTERIOR CERVICAL DECOMP/DISCECTOMY FUSION: SHX1161

## 2017-11-19 DIAGNOSIS — M542 Cervicalgia: Secondary | ICD-10-CM | POA: Diagnosis not present

## 2017-11-19 DIAGNOSIS — M5412 Radiculopathy, cervical region: Secondary | ICD-10-CM | POA: Diagnosis not present

## 2017-11-19 DIAGNOSIS — M4317 Spondylolisthesis, lumbosacral region: Secondary | ICD-10-CM | POA: Diagnosis not present

## 2017-11-19 DIAGNOSIS — R030 Elevated blood-pressure reading, without diagnosis of hypertension: Secondary | ICD-10-CM | POA: Diagnosis not present

## 2017-12-26 ENCOUNTER — Other Ambulatory Visit: Payer: Self-pay | Admitting: Women's Health

## 2017-12-26 DIAGNOSIS — Z79890 Hormone replacement therapy (postmenopausal): Secondary | ICD-10-CM

## 2017-12-31 ENCOUNTER — Telehealth: Payer: Self-pay | Admitting: *Deleted

## 2017-12-31 DIAGNOSIS — Z79890 Hormone replacement therapy (postmenopausal): Secondary | ICD-10-CM

## 2017-12-31 MED ORDER — ESTRADIOL 0.5 MG PO TABS: 0.5000 mg | ORAL_TABLET | Freq: Every day | ORAL | 1 refills | Status: DC

## 2017-12-31 NOTE — Telephone Encounter (Signed)
Pt has annual scheduled on 02/18/18, needs refill on estrogen 0.5 mg tablets. Rx sent.

## 2018-01-30 DIAGNOSIS — M4317 Spondylolisthesis, lumbosacral region: Secondary | ICD-10-CM | POA: Diagnosis not present

## 2018-01-30 DIAGNOSIS — M545 Low back pain: Secondary | ICD-10-CM | POA: Diagnosis not present

## 2018-02-04 ENCOUNTER — Encounter: Payer: Self-pay | Admitting: Gastroenterology

## 2018-02-07 ENCOUNTER — Encounter: Payer: Self-pay | Admitting: Gastroenterology

## 2018-02-07 ENCOUNTER — Other Ambulatory Visit (INDEPENDENT_AMBULATORY_CARE_PROVIDER_SITE_OTHER): Payer: PPO

## 2018-02-07 ENCOUNTER — Ambulatory Visit: Payer: PPO | Admitting: Gastroenterology

## 2018-02-07 VITALS — BP 160/90 | HR 71 | Ht 63.5 in | Wt 171.0 lb

## 2018-02-07 DIAGNOSIS — R109 Unspecified abdominal pain: Secondary | ICD-10-CM

## 2018-02-07 DIAGNOSIS — B029 Zoster without complications: Secondary | ICD-10-CM

## 2018-02-07 DIAGNOSIS — R1012 Left upper quadrant pain: Secondary | ICD-10-CM | POA: Diagnosis not present

## 2018-02-07 DIAGNOSIS — R1013 Epigastric pain: Secondary | ICD-10-CM | POA: Diagnosis not present

## 2018-02-07 DIAGNOSIS — Z86010 Personal history of colonic polyps: Secondary | ICD-10-CM | POA: Diagnosis not present

## 2018-02-07 LAB — CBC WITH DIFFERENTIAL/PLATELET
Basophils Absolute: 0.1 10*3/uL (ref 0.0–0.1)
Basophils Relative: 0.8 % (ref 0.0–3.0)
Eosinophils Absolute: 0.1 10*3/uL (ref 0.0–0.7)
Eosinophils Relative: 0.5 % (ref 0.0–5.0)
HCT: 36.6 % (ref 36.0–46.0)
Hemoglobin: 12.2 g/dL (ref 12.0–15.0)
Lymphocytes Relative: 14.1 % (ref 12.0–46.0)
Lymphs Abs: 2.4 10*3/uL (ref 0.7–4.0)
MCHC: 33.4 g/dL (ref 30.0–36.0)
MCV: 96.5 fl (ref 78.0–100.0)
Monocytes Absolute: 1.4 10*3/uL — ABNORMAL HIGH (ref 0.1–1.0)
Monocytes Relative: 8.4 % (ref 3.0–12.0)
Neutro Abs: 12.7 10*3/uL — ABNORMAL HIGH (ref 1.4–7.7)
Neutrophils Relative %: 76.2 % (ref 43.0–77.0)
Platelets: 377 10*3/uL (ref 150.0–400.0)
RBC: 3.79 Mil/uL — ABNORMAL LOW (ref 3.87–5.11)
RDW: 14.6 % (ref 11.5–15.5)
WBC: 16.7 10*3/uL — ABNORMAL HIGH (ref 4.0–10.5)

## 2018-02-07 LAB — COMPREHENSIVE METABOLIC PANEL
ALT: 11 U/L (ref 0–35)
AST: 13 U/L (ref 0–37)
Albumin: 4.1 g/dL (ref 3.5–5.2)
Alkaline Phosphatase: 81 U/L (ref 39–117)
BUN: 23 mg/dL (ref 6–23)
CO2: 24 mEq/L (ref 19–32)
Calcium: 9.5 mg/dL (ref 8.4–10.5)
Chloride: 102 mEq/L (ref 96–112)
Creatinine, Ser: 0.99 mg/dL (ref 0.40–1.20)
GFR: 59.43 mL/min — ABNORMAL LOW (ref >60.00)
Glucose, Bld: 112 mg/dL — ABNORMAL HIGH (ref 70–99)
Potassium: 3.9 mEq/L (ref 3.5–5.1)
Sodium: 136 mEq/L (ref 135–145)
Total Bilirubin: 0.2 mg/dL (ref 0.2–1.2)
Total Protein: 8.1 g/dL (ref 6.0–8.3)

## 2018-02-07 LAB — TSH: TSH: 1.62 u[IU]/mL (ref 0.35–4.50)

## 2018-02-07 MED ORDER — VALACYCLOVIR HCL 1 G PO TABS: 1000.0000 mg | ORAL_TABLET | Freq: Three times a day (TID) | ORAL | 0 refills | Status: DC

## 2018-02-07 NOTE — Patient Instructions (Signed)
If you are age 67 or older, your body mass index should be between 23-30. Your Body mass index is 29.82 kg/m. If this is out of the aforementioned range listed, please consider follow up with your Primary Care Provider.  If you are age 20 or younger, your body mass index should be between 19-25. Your Body mass index is 29.82 kg/m. If this is out of the aformentioned range listed, please consider follow up with your Primary Care Provider.   Your physician has requested that you go to the basement for the following lab work before leaving today: CMET CBC w/Diff TSH  You have been scheduled for a CT scan of the abdomen and pelvis at [facility] ([address]---this is in the same building as [facility]).   You are scheduled on 02/17/18 at 8:30 am. You should arrive 15 minutes prior to your appointment time for registration. Please follow the written instructions below on the day of your exam:  WARNING: IF YOU ARE ALLERGIC TO IODINE/X-RAY DYE, PLEASE NOTIFY RADIOLOGY IMMEDIATELY AT [phone]! YOU WILL BE GIVEN A 13 HOUR PREMEDICATION PREP.  1) Do not eat anything after 6:30 am (2hours prior to your test) 2) You have been given 2 bottles of oral contrast to drink. The solution may taste better if refrigerated, but do NOT add ice or any other liquid to this solution. Shake well before drinking.     Drink 1 bottle of contrast @ 7:30 am (1 hour prior to your exam)  You may take any medications as prescribed with a small amount of water except for the following: Metformin, Glucophage, Glucovance, Avandamet, Riomet, Fortamet, Actoplus Met, Janumet, Glumetza or Metaglip. The above medications must be held the day of the exam AND 48 hours after the exam.  The purpose of you drinking the oral contrast is to aid in the visualization of your intestinal tract. The contrast solution may cause some diarrhea. Before your exam is started, you will be given a small amount of fluid to  drink. Depending on your individual set of symptoms, you may also receive an intravenous injection of x-ray contrast/dye. Plan on being at [facility] for 30 minutes or longer, depending on the type of exam you are having performed.  This test typically takes 30-45 minutes to complete.  If you have any questions regarding your exam or if you need to reschedule, you may call the CT department at [phone] between the hours of 8:00 am and 5:00 pm, Monday-Friday.  Thank you for choosing me and [facility].  Alonza Bogus, PA-C

## 2018-02-07 NOTE — Progress Notes (Signed)
02/07/2018 Robin Arellano [id number] [birth year]   HISTORY OF PRESENT ILLNESS:  This is a 67 year old female with PMH of back pain, chronic pelvic pain, fibromyalgia, and OA who presents to our office today with complaint of left sided abdominal pain.  She claims that she does not have a PCP but is in the process of getting set up with one.  Anyway, she reports pain on her left side, under her ribs for the past 3 weeks.  Says that if feels like a knife/stabbing pain and is constant.  She says that it woke her from sleep initially.  She says that she's had shingles in that area recently and thinks that she has shingles developing on her lower back again, but she does not feel that this left sided abdominal pain is related to the shingles.  She denies any nausea, vomiting, fevers, chills, dark or bloody stools.  She is moving her bowels well.  Has some reflux issues but is not on any medication and uses "vinegar" when she has symptoms.    Had colonoscopy in 08/2017 by Dr. Ardis Hughs at which time she was found to have diverticulosis and a 14 mm tubular adenoma that was removed from the ascending colon by piecemeal.  It was recommended that she had a repeat colonoscopy in 6 months.   Past Medical History:  Diagnosis Date  . Back pain   . Chronic female pelvic pain   . Fibromyalgia   . H/O leukocytosis   . MI (myocardial infarction) ([place])    Pt states she did not have a MI- EKG was normal, was GERD  . Osteoarthritis   . Ovarian cyst, right   . Post-operative nausea and vomiting   . Vitamin D deficiency    Past Surgical History:  Procedure Laterality Date  . ABDOMINAL HYSTERECTOMY  1994   [place]H.[place]  . KNEE SURGERY  1996   [place]  . PELVIC LAPAROSCOPY  1989   W LYSIS OF ADHESIONS/L SALPINGONEOSTOMY    reports that she quit smoking about 19 years ago. She has never used smokeless tobacco. She reports that she does not drink alcohol or use drugs. family history includes  Aneurysm in her father; COPD in her brother; Cancer in her mother; Heart disease in her paternal grandfather; Skin cancer in her brother and brother. Allergies  Allergen Reactions  . Prednisone Hives, Itching and Swelling  . Latex Rash      Outpatient Encounter Medications as of 02/07/2018  Medication Sig  . ALPRAZolam (XANAX) 0.5 MG tablet Take 0.5 mg by mouth at bedtime as needed for anxiety.   . Ascorbic Acid (VITAMIN C) 1000 MG tablet Take 1,000 mg by mouth daily.  . butalbital-acetaminophen-caffeine (FIORICET, ESGIC) 50-325-40 MG tablet Take 1 tablet by mouth as needed.   . cholecalciferol (VITAMIN D) 1000 units tablet Take 2,000 Units by mouth daily.   . Cyanocobalamin (VITAMIN B-12 PO) Take 1 mg by mouth See admin instructions. Takes every Saturday  . estradiol (ESTRACE) 0.5 MG tablet Take 1 tablet (0.5 mg total) by mouth daily.  Marland Kitchen HYDROcodone-acetaminophen (NORCO) 10-325 MG tablet Take 1-2 tablets by mouth 2 (two) times daily as needed for pain.   Marland Kitchen MILK THISTLE PO Take by mouth. Take 525 mg twice a week  . NON FORMULARY Protein Kosher Collegen-Take 2 tabs with water every night  . vitamin E (VITAMIN E) 400 UNIT capsule Take 400 Units by mouth every Saturday.  . zolpidem (  AMBIEN) 10 MG tablet Take 10 mg by mouth at bedtime as needed.   . [DISCONTINUED] Na Sulfate-K Sulfate-Mg Sulf (SUPREP BOWEL PREP KIT) 17.5-3.13-1.6 GM/180ML SOLN Suprep as directed / no substitutions  . [DISCONTINUED] pantoprazole (PROTONIX) 20 MG tablet Take 1 tablet (20 mg total) by mouth daily. (Patient not taking: Reported on 07/30/2017)   No facility-administered encounter medications on file as of 02/07/2018.      REVIEW OF SYSTEMS  : All other systems reviewed and negative except where noted in the History of Present Illness.   PHYSICAL EXAM: BP (!) 160/90   Pulse 71   Ht 5' 3.5" (1.613 m)   Wt 171 lb (77.6 kg)   BMI 29.82 kg/m  General: Well developed white female in no acute distress Head:  Normocephalic and atraumatic Eyes:  Sclerae anicteric, conjunctiva pink. Ears: Normal auditory acuity Lungs: Clear throughout to auscultation; no increased WOB. Heart: Regular rate and rhythm; no M/R/G. Abdomen: Soft, non-distended.  BS present.  LUQ TTP with very light palpation.  Epigastric TTP as well.  Musculoskeletal: Symmetrical with no gross deformities  Skin: No lesions on visible extremities Extremities: No edema  Neurological: Alert oriented x 4, grossly non-focal Skin:  Small patchy area with rash and what appears to be very small vesicles on her left upper buttocks. Psychological:  Alert and cooperative. Normal mood and affect  ASSESSMENT AND PLAN: *LUQ/left flank and epigastric abdominal pain:  Tells me that she had shingles in that area recently and she is tender with very superficial palpation/touch, but she does not feel that this pain is nerve pain from her previous shingles.  No other associated GI symptoms.  Will start with labs including CBC, CMP, and TSH.  Will check at CT scan of the abdomen as well. *Rash:  Possible shingles.  Surrounding skin is very sensitive.  She had another episode of shingles on her left flank recently and says that it feels the same.  She does not think that her current LUQ abdominal pain is related to the shingles though, however.  She does not have a PCP and asked if I would treat this.  I will give her valacyclovir 1000 mg TID for 7 days.  I advised that she needs to obtain a PCP. *Personal history of colon polyps:  Had a 14 mm TA removed from the ascending colon in 08/2017.  Repeat was recommended at 6 month interval so she is due for this.  Will plan to schedule once we get CT scan results if all looks well with that.   CC:  No ref. provider found

## 2018-02-12 DIAGNOSIS — Z86010 Personal history of colonic polyps: Secondary | ICD-10-CM | POA: Insufficient documentation

## 2018-02-13 NOTE — Progress Notes (Signed)
I agree with the above note, plan 

## 2018-02-17 ENCOUNTER — Ambulatory Visit (INDEPENDENT_AMBULATORY_CARE_PROVIDER_SITE_OTHER)
Admission: RE | Admit: 2018-02-17 | Discharge: 2018-02-17 | Disposition: A | Discharge status: Home / Self Care | Payer: PPO | Source: Ambulatory Visit | Attending: Gastroenterology | Admitting: Gastroenterology

## 2018-02-17 DIAGNOSIS — R1012 Left upper quadrant pain: Secondary | ICD-10-CM

## 2018-02-17 MED ORDER — IOPAMIDOL (ISOVUE-300) INJECTION 61%
100.0000 mL | Freq: Once | INTRAVENOUS | Status: AC | PRN
  Administered 2018-02-17: 100 mL via INTRAVENOUS

## 2018-02-18 ENCOUNTER — Encounter: Payer: Self-pay | Admitting: Women's Health

## 2018-02-18 ENCOUNTER — Ambulatory Visit (INDEPENDENT_AMBULATORY_CARE_PROVIDER_SITE_OTHER): Payer: PPO | Admitting: Women's Health

## 2018-02-18 VITALS — BP 122/82 | Ht 63.0 in | Wt 170.0 lb

## 2018-02-18 DIAGNOSIS — Z79890 Hormone replacement therapy (postmenopausal): Secondary | ICD-10-CM

## 2018-02-18 DIAGNOSIS — Z01419 Encounter for gynecological examination (general) (routine) without abnormal findings: Secondary | ICD-10-CM

## 2018-02-18 MED ORDER — ESTRADIOL 0.5 MG PO TABS: 0.5000 mg | ORAL_TABLET | Freq: Every day | ORAL | 4 refills | Status: DC

## 2018-02-18 NOTE — Progress Notes (Signed)
Robin Arellano [birth year] [id number]    History:    Presents for annual exam.  1994 TAH with BSO for endometriosis on half tablet of estradiol 0.5 daily.  Reports increased hot flashes and poor sleep when not taking.  Normal Pap and mammogram history.  Had a normal abdominal CT scan 02/17/2018 for upper left abdominal discomfort.  MI was ruled out.  Osteoarthritis and fibromyalgia.  Past medical history, past surgical history, family history and social history were all reviewed and documented in the EPIC chart.  Retired, daughter mental health problems granddaughter doing well.  Son okay.  ROS:  A ROS was performed and pertinent positives and negatives are included.  Exam:  Vitals:   02/18/18 1039  BP: 122/82  Weight: 170 lb (77.1 kg)  Height: 5\' 3"  (1.6 m)   Body mass index is 30.11 kg/m.   General appearance:  Normal Thyroid:  Symmetrical, normal in size, without palpable masses or nodularity. Respiratory  Auscultation:  Clear without wheezing or rhonchi Cardiovascular  Auscultation:  Regular rate, without rubs, murmurs or gallops  Edema/varicosities:  Not grossly evident Abdominal  Soft,nontender, without masses, guarding or rebound.  Liver/spleen:  No organomegaly noted  Hernia:  None appreciated  Skin  Inspection:  Grossly normal   Breasts: Examined lying and sitting.     Right: Without masses, retractions, discharge or axillary adenopathy.     Left: Without masses, retractions, discharge or axillary adenopathy. Gentitourinary   Inguinal/mons:  Normal without inguinal adenopathy  External genitalia:  Normal  BUS/Urethra/Skene's glands:  Normal  Vagina:  Normal  Cervix: And uterus absent.  Adnexa/parametria:     Rt: Without masses or tenderness.   Lt: Without masses or tenderness.  Anus and perineum: Normal  Digital rectal exam: Normal sphincter tone without palpated masses or tenderness  Assessment/Plan:  67 y.o. MWF G2 P2 for breast and pelvic exam with no GYN  complaints.  1994 TAH with BSO for endometriosis on estrogen Osteoarthritis, fibromyalgia-primary care managing labs HSV rare outbreaks  Plan: Estradiol . 5/ 1/2 tablet daily reviewed risks of blood clots, strokes and breast cancer reviewed importance of continuing low dose or stopping.  Women's health initiative study reviewed.  Some vaginal dryness with intercourse over-the-counter lubricants encouraged.  Valtrex 500 twice daily for 3-5 days as needed.   Follow-up with primary care for DEXA, vaccines.  SBE's, continue annual screening mammogram, calcium rich foods, reviewed importance of increasing regular exercise balance type exercise such as yoga encouraged.  Home safety, fall prevention discussed.      Garrett, 1:52 PM 02/18/2018

## 2018-02-18 NOTE — Patient Instructions (Signed)
Health Maintenance for Postmenopausal Women Menopause is a normal process in which your reproductive ability comes to an end. This process happens gradually over a span of months to years, usually between the ages of 22 and 9. Menopause is complete when you have missed 12 consecutive menstrual periods. It is important to talk with your health care provider about some of the most common conditions that affect postmenopausal women, such as heart disease, cancer, and bone loss (osteoporosis). Adopting a healthy lifestyle and getting preventive care can help to promote your health and wellness. Those actions can also lower your chances of developing some of these common conditions. What should I know about menopause? During menopause, you may experience a number of symptoms, such as:  Moderate-to-severe hot flashes.  Night sweats.  Decrease in sex drive.  Mood swings.  Headaches.  Tiredness.  Irritability.  Memory problems.  Insomnia.  Choosing to treat or not to treat menopausal changes is an individual decision that you make with your health care provider. What should I know about hormone replacement therapy and supplements? Hormone therapy products are effective for treating symptoms that are associated with menopause, such as hot flashes and night sweats. Hormone replacement carries certain risks, especially as you become older. If you are thinking about using estrogen or estrogen with progestin treatments, discuss the benefits and risks with your health care provider. What should I know about heart disease and stroke? Heart disease, heart attack, and stroke become more likely as you age. This may be due, in part, to the hormonal changes that your body experiences during menopause. These can affect how your body processes dietary fats, triglycerides, and cholesterol. Heart attack and stroke are both medical emergencies. There are many things that you can do to help prevent heart disease  and stroke:  Have your blood pressure checked at least every 1-2 years. High blood pressure causes heart disease and increases the risk of stroke.  If you are 53-22 years old, ask your health care provider if you should take aspirin to prevent a heart attack or a stroke.  Do not use any tobacco products, including cigarettes, chewing tobacco, or electronic cigarettes. If you need help quitting, ask your health care provider.  It is important to eat a healthy diet and maintain a healthy weight. ? Be sure to include plenty of vegetables, fruits, low-fat dairy products, and lean protein. ? Avoid eating foods that are high in solid fats, added sugars, or salt (sodium).  Get regular exercise. This is one of the most important things that you can do for your health. ? Try to exercise for at least 150 minutes each week. The type of exercise that you do should increase your heart rate and make you sweat. This is known as moderate-intensity exercise. ? Try to do strengthening exercises at least twice each week. Do these in addition to the moderate-intensity exercise.  Know your numbers.Ask your health care provider to check your cholesterol and your blood glucose. Continue to have your blood tested as directed by your health care provider.  What should I know about cancer screening? There are several types of cancer. Take the following steps to reduce your risk and to catch any cancer development as early as possible. Breast Cancer  Practice breast self-awareness. ? This means understanding how your breasts normally appear and feel. ? It also means doing regular breast self-exams. Let your health care provider know about any changes, no matter how small.  If you are 40  or older, have a clinician do a breast exam (clinical breast exam or CBE) every year. Depending on your age, family history, and medical history, it may be recommended that you also have a yearly breast X-ray (mammogram).  If you  have a family history of breast cancer, talk with your health care provider about genetic screening.  If you are at high risk for breast cancer, talk with your health care provider about having an MRI and a mammogram every year.  Breast cancer (BRCA) gene test is recommended for women who have family members with BRCA-related cancers. Results of the assessment will determine the need for genetic counseling and BRCA1 and for BRCA2 testing. BRCA-related cancers include these types: ? Breast. This occurs in males or females. ? Ovarian. ? Tubal. This may also be called fallopian tube cancer. ? Cancer of the abdominal or pelvic lining (peritoneal cancer). ? Prostate. ? Pancreatic.  Cervical, Uterine, and Ovarian Cancer Your health care provider may recommend that you be screened regularly for cancer of the pelvic organs. These include your ovaries, uterus, and vagina. This screening involves a pelvic exam, which includes checking for microscopic changes to the surface of your cervix (Pap test).  For women ages 21-65, health care providers may recommend a pelvic exam and a Pap test every three years. For women ages 79-65, they may recommend the Pap test and pelvic exam, combined with testing for human papilloma virus (HPV), every five years. Some types of HPV increase your risk of cervical cancer. Testing for HPV may also be done on women of any age who have unclear Pap test results.  Other health care providers may not recommend any screening for nonpregnant women who are considered low risk for pelvic cancer and have no symptoms. Ask your health care provider if a screening pelvic exam is right for you.  If you have had past treatment for cervical cancer or a condition that could lead to cancer, you need Pap tests and screening for cancer for at least 20 years after your treatment. If Pap tests have been discontinued for you, your risk factors (such as having a new sexual partner) need to be  reassessed to determine if you should start having screenings again. Some women have medical problems that increase the chance of getting cervical cancer. In these cases, your health care provider may recommend that you have screening and Pap tests more often.  If you have a family history of uterine cancer or ovarian cancer, talk with your health care provider about genetic screening.  If you have vaginal bleeding after reaching menopause, tell your health care provider.  There are currently no reliable tests available to screen for ovarian cancer.  Lung Cancer Lung cancer screening is recommended for adults 69-62 years old who are at high risk for lung cancer because of a history of smoking. A yearly low-dose CT scan of the lungs is recommended if you:  Currently smoke.  Have a history of at least 30 pack-years of smoking and you currently smoke or have quit within the past 15 years. A pack-year is smoking an average of one pack of cigarettes per day for one year.  Yearly screening should:  Continue until it has been 15 years since you quit.  Stop if you develop a health problem that would prevent you from having lung cancer treatment.  Colorectal Cancer  This type of cancer can be detected and can often be prevented.  Routine colorectal cancer screening usually begins at  age 42 and continues through age 45.  If you have risk factors for colon cancer, your health care provider may recommend that you be screened at an earlier age.  If you have a family history of colorectal cancer, talk with your health care provider about genetic screening.  Your health care provider may also recommend using home test kits to check for hidden blood in your stool.  A small camera at the end of a tube can be used to examine your colon directly (sigmoidoscopy or colonoscopy). This is done to check for the earliest forms of colorectal cancer.  Direct examination of the colon should be repeated every  5-10 years until age 71. However, if early forms of precancerous polyps or small growths are found or if you have a family history or genetic risk for colorectal cancer, you may need to be screened more often.  Skin Cancer  Check your skin from head to toe regularly.  Monitor any moles. Be sure to tell your health care provider: ? About any new moles or changes in moles, especially if there is a change in a mole's shape or color. ? If you have a mole that is larger than the size of a pencil eraser.  If any of your family members has a history of skin cancer, especially at a Kerly Rigsbee age, talk with your health care provider about genetic screening.  Always use sunscreen. Apply sunscreen liberally and repeatedly throughout the day.  Whenever you are outside, protect yourself by wearing long sleeves, pants, a wide-brimmed hat, and sunglasses.  What should I know about osteoporosis? Osteoporosis is a condition in which bone destruction happens more quickly than new bone creation. After menopause, you may be at an increased risk for osteoporosis. To help prevent osteoporosis or the bone fractures that can happen because of osteoporosis, the following is recommended:  If you are 46-71 years old, get at least 1,000 mg of calcium and at least 600 mg of vitamin D per day.  If you are older than age 55 but younger than age 65, get at least 1,200 mg of calcium and at least 600 mg of vitamin D per day.  If you are older than age 54, get at least 1,200 mg of calcium and at least 800 mg of vitamin D per day.  Smoking and excessive alcohol intake increase the risk of osteoporosis. Eat foods that are rich in calcium and vitamin D, and do weight-bearing exercises several times each week as directed by your health care provider. What should I know about how menopause affects my mental health? Depression may occur at any age, but it is more common as you become older. Common symptoms of depression  include:  Low or sad mood.  Changes in sleep patterns.  Changes in appetite or eating patterns.  Feeling an overall lack of motivation or enjoyment of activities that you previously enjoyed.  Frequent crying spells.  Talk with your health care provider if you think that you are experiencing depression. What should I know about immunizations? It is important that you get and maintain your immunizations. These include:  Tetanus, diphtheria, and pertussis (Tdap) booster vaccine.  Influenza every year before the flu season begins.  Pneumonia vaccine.  Shingles vaccine.  Your health care provider may also recommend other immunizations. This information is not intended to replace advice given to you by your health care provider. Make sure you discuss any questions you have with your health care provider. Document Released: 12/21/2005  Document Revised: 05/18/2016 Document Reviewed: 08/02/2015 Elsevier Interactive Patient Education  2018 [employer]  

## 2018-02-18 NOTE — Progress Notes (Signed)
Robin Arellano January 19, 1951 [id number]

## 2018-02-19 ENCOUNTER — Telehealth: Payer: Self-pay | Admitting: Gastroenterology

## 2018-02-19 DIAGNOSIS — M129 Arthropathy, unspecified: Secondary | ICD-10-CM | POA: Diagnosis not present

## 2018-02-19 DIAGNOSIS — R7303 Prediabetes: Secondary | ICD-10-CM | POA: Diagnosis not present

## 2018-02-19 DIAGNOSIS — E559 Vitamin D deficiency, unspecified: Secondary | ICD-10-CM | POA: Diagnosis not present

## 2018-02-19 DIAGNOSIS — M5441 Lumbago with sciatica, right side: Secondary | ICD-10-CM | POA: Diagnosis not present

## 2018-02-19 DIAGNOSIS — M5442 Lumbago with sciatica, left side: Secondary | ICD-10-CM | POA: Diagnosis not present

## 2018-02-19 DIAGNOSIS — Z0000 Encounter for general adult medical examination without abnormal findings: Secondary | ICD-10-CM | POA: Diagnosis not present

## 2018-02-19 NOTE — Telephone Encounter (Signed)
The pt has been advised that the results have not been reviewed by Janett Billow.  The CT and labs have been faxed to her PCP per pt request

## 2018-02-26 ENCOUNTER — Telehealth: Payer: Self-pay | Admitting: *Deleted

## 2018-02-26 ENCOUNTER — Telehealth: Payer: Self-pay | Admitting: Gastroenterology

## 2018-02-26 DIAGNOSIS — Z79890 Hormone replacement therapy (postmenopausal): Secondary | ICD-10-CM

## 2018-02-26 MED ORDER — VALACYCLOVIR HCL 500 MG PO TABS: ORAL_TABLET | ORAL | 3 refills | Status: DC

## 2018-02-26 MED ORDER — ESTRADIOL 0.5 MG PO TABS: 0.5000 mg | ORAL_TABLET | Freq: Every day | ORAL | 4 refills | Status: DC

## 2018-02-26 NOTE — Telephone Encounter (Signed)
Alonza Bogus, PA please review and advise on CT results

## 2018-02-26 NOTE — Telephone Encounter (Signed)
Patient called was seen for annual on 02/18/18 states Rx for valtrex 500 mg twice daily for 3-5 days.  Estradiol 0.5 mg tablet both Rx sent.

## 2018-04-11 ENCOUNTER — Telehealth: Payer: Self-pay | Admitting: *Deleted

## 2018-04-11 NOTE — Telephone Encounter (Signed)
Patient called c/o UTI stating Azo was not working, I told her she will need a visit, but she declined. Stating Izora Gala would just send Rx to pharmacy. I told her Izora Gala is not here, she still declined visit.

## 2018-05-01 DIAGNOSIS — M4317 Spondylolisthesis, lumbosacral region: Secondary | ICD-10-CM | POA: Diagnosis not present

## 2018-05-09 ENCOUNTER — Encounter: Payer: PPO | Admitting: Gastroenterology

## 2018-06-05 DIAGNOSIS — R030 Elevated blood-pressure reading, without diagnosis of hypertension: Secondary | ICD-10-CM | POA: Diagnosis not present

## 2018-06-05 DIAGNOSIS — M542 Cervicalgia: Secondary | ICD-10-CM | POA: Diagnosis not present

## 2018-06-05 DIAGNOSIS — Z6830 Body mass index (BMI) 30.0-30.9, adult: Secondary | ICD-10-CM | POA: Diagnosis not present

## 2018-06-05 DIAGNOSIS — M4317 Spondylolisthesis, lumbosacral region: Secondary | ICD-10-CM | POA: Diagnosis not present

## 2018-08-04 DIAGNOSIS — M545 Low back pain: Secondary | ICD-10-CM | POA: Diagnosis not present

## 2018-08-04 DIAGNOSIS — M4317 Spondylolisthesis, lumbosacral region: Secondary | ICD-10-CM | POA: Diagnosis not present

## 2018-08-15 ENCOUNTER — Encounter: Payer: Self-pay | Admitting: Gastroenterology

## 2018-09-01 DIAGNOSIS — M4317 Spondylolisthesis, lumbosacral region: Secondary | ICD-10-CM | POA: Diagnosis not present

## 2018-09-01 DIAGNOSIS — M542 Cervicalgia: Secondary | ICD-10-CM | POA: Diagnosis not present

## 2018-09-01 DIAGNOSIS — M5412 Radiculopathy, cervical region: Secondary | ICD-10-CM | POA: Diagnosis not present

## 2018-09-01 DIAGNOSIS — R030 Elevated blood-pressure reading, without diagnosis of hypertension: Secondary | ICD-10-CM | POA: Diagnosis not present

## 2018-09-16 ENCOUNTER — Ambulatory Visit (AMBULATORY_SURGERY_CENTER): Payer: PPO | Admitting: *Deleted

## 2018-09-16 ENCOUNTER — Other Ambulatory Visit: Payer: Self-pay

## 2018-09-16 VITALS — Ht 63.0 in | Wt 178.2 lb

## 2018-09-16 DIAGNOSIS — Z86010 Personal history of colonic polyps: Secondary | ICD-10-CM

## 2018-09-16 MED ORDER — PEG 3350-KCL-NABCB-NACL-NASULF 236 G PO SOLR: 4000.0000 mL | Freq: Once | ORAL | 0 refills | Status: AC

## 2018-09-16 NOTE — Progress Notes (Signed)
Patient denies any allergies to egg or soy products. Patient has PONV with anesthesia/sedation.  Patient denies oxygen use at home and denies diet medications. Patient denies information for colonoscopy.

## 2018-09-17 ENCOUNTER — Encounter: Payer: Self-pay | Admitting: Gastroenterology

## 2018-09-20 DIAGNOSIS — R5383 Other fatigue: Secondary | ICD-10-CM | POA: Diagnosis not present

## 2018-09-20 DIAGNOSIS — R072 Precordial pain: Secondary | ICD-10-CM | POA: Diagnosis not present

## 2018-09-20 DIAGNOSIS — R7303 Prediabetes: Secondary | ICD-10-CM | POA: Diagnosis not present

## 2018-09-20 DIAGNOSIS — E785 Hyperlipidemia, unspecified: Secondary | ICD-10-CM | POA: Diagnosis not present

## 2018-09-20 DIAGNOSIS — I10 Essential (primary) hypertension: Secondary | ICD-10-CM | POA: Diagnosis not present

## 2018-09-20 DIAGNOSIS — Z87891 Personal history of nicotine dependence: Secondary | ICD-10-CM | POA: Diagnosis not present

## 2018-09-20 DIAGNOSIS — M129 Arthropathy, unspecified: Secondary | ICD-10-CM | POA: Diagnosis not present

## 2018-09-22 ENCOUNTER — Emergency Department (HOSPITAL_COMMUNITY): Payer: PPO

## 2018-09-22 ENCOUNTER — Emergency Department (HOSPITAL_COMMUNITY)
Admission: EM | Admit: 2018-09-22 | Discharge: 2018-09-22 | Disposition: A | Discharge status: Home / Self Care | Payer: PPO | Attending: Emergency Medicine | Admitting: Emergency Medicine

## 2018-09-22 ENCOUNTER — Encounter (HOSPITAL_COMMUNITY): Payer: Self-pay | Admitting: Emergency Medicine

## 2018-09-22 DIAGNOSIS — R209 Unspecified disturbances of skin sensation: Secondary | ICD-10-CM | POA: Diagnosis not present

## 2018-09-22 DIAGNOSIS — R079 Chest pain, unspecified: Secondary | ICD-10-CM | POA: Diagnosis not present

## 2018-09-22 DIAGNOSIS — Z91040 Latex allergy status: Secondary | ICD-10-CM | POA: Insufficient documentation

## 2018-09-22 DIAGNOSIS — M5413 Radiculopathy, cervicothoracic region: Secondary | ICD-10-CM | POA: Diagnosis not present

## 2018-09-22 DIAGNOSIS — R5383 Other fatigue: Secondary | ICD-10-CM | POA: Diagnosis not present

## 2018-09-22 DIAGNOSIS — R202 Paresthesia of skin: Secondary | ICD-10-CM

## 2018-09-22 DIAGNOSIS — Z79899 Other long term (current) drug therapy: Secondary | ICD-10-CM | POA: Diagnosis not present

## 2018-09-22 DIAGNOSIS — Z87891 Personal history of nicotine dependence: Secondary | ICD-10-CM | POA: Insufficient documentation

## 2018-09-22 DIAGNOSIS — R200 Anesthesia of skin: Secondary | ICD-10-CM | POA: Diagnosis not present

## 2018-09-22 DIAGNOSIS — I252 Old myocardial infarction: Secondary | ICD-10-CM | POA: Insufficient documentation

## 2018-09-22 DIAGNOSIS — E785 Hyperlipidemia, unspecified: Secondary | ICD-10-CM | POA: Diagnosis not present

## 2018-09-22 DIAGNOSIS — R072 Precordial pain: Secondary | ICD-10-CM | POA: Diagnosis not present

## 2018-09-22 DIAGNOSIS — R42 Dizziness and giddiness: Secondary | ICD-10-CM | POA: Diagnosis not present

## 2018-09-22 LAB — CBC
HCT: 38.8 % (ref 36.0–46.0)
Hemoglobin: 12.4 g/dL (ref 12.0–15.0)
MCH: 32.5 pg (ref 26.0–34.0)
MCHC: 32 g/dL (ref 30.0–36.0)
MCV: 101.6 fL — ABNORMAL HIGH (ref 80.0–100.0)
Platelets: 333 10*3/uL (ref 150–400)
RBC: 3.82 MIL/uL — ABNORMAL LOW (ref 3.87–5.11)
RDW: 13.8 % (ref 11.5–15.5)
WBC: 12.8 10*3/uL — ABNORMAL HIGH (ref 4.0–10.5)
nRBC: 0 % (ref 0.0–0.2)

## 2018-09-22 LAB — BASIC METABOLIC PANEL
Anion gap: 8 (ref 5–15)
BUN: 23 mg/dL (ref 8–23)
CO2: 25 mmol/L (ref 22–32)
Calcium: 9.5 mg/dL (ref 8.9–10.3)
Chloride: 104 mmol/L (ref 98–111)
Creatinine, Ser: 1.03 mg/dL — ABNORMAL HIGH (ref 0.44–1.00)
GFR calc Af Amer: >60 mL/min (ref >60)
GFR calc non Af Amer: 55 mL/min — ABNORMAL LOW (ref >60)
Glucose, Bld: 102 mg/dL — ABNORMAL HIGH (ref 70–99)
Potassium: 4.3 mmol/L (ref 3.5–5.1)
Sodium: 137 mmol/L (ref 135–145)

## 2018-09-22 LAB — URINALYSIS, ROUTINE W REFLEX MICROSCOPIC
Bilirubin Urine: NEGATIVE
Glucose, UA: NEGATIVE mg/dL
Hgb urine dipstick: NEGATIVE
Ketones, ur: 5 mg/dL — AB
Nitrite: NEGATIVE
Protein, ur: NEGATIVE mg/dL
Specific Gravity, Urine: 1.018 (ref 1.005–1.030)
pH: 5 (ref 5.0–8.0)

## 2018-09-22 LAB — I-STAT TROPONIN, ED: Troponin i, poc: 0.01 ng/mL (ref 0.00–0.08)

## 2018-09-22 MED ORDER — NITROFURANTOIN MONOHYD MACRO 100 MG PO CAPS: 100.0000 mg | ORAL_CAPSULE | Freq: Two times a day (BID) | ORAL | 0 refills | Status: DC

## 2018-09-22 MED ORDER — FLUCONAZOLE 150 MG PO TABS: 150.0000 mg | ORAL_TABLET | Freq: Every day | ORAL | 0 refills | Status: AC

## 2018-09-22 NOTE — ED Notes (Signed)
MD at bedside. 

## 2018-09-22 NOTE — Discharge Instructions (Signed)
You were seen in the emergency department for intermittent chest pain along with some right-sided facial numbness.  You had a CAT scan blood work and EKG that did not show an obvious cause of your symptoms.  We offered you an MRI here in the department but you are going to follow-up with your primary care doctor and have them schedule this.  You also will need to follow-up with cardiology.  We are treating you for possible UTI with antibiotics.  Please follow-up with your doctor and return if any worsening symptoms.

## 2018-09-22 NOTE — ED Triage Notes (Signed)
Per pt, states chest pain and right facial numbness for 5 days-states she saw MD on Saturday and had EKG, labs done-states PCP told her she might have a MI and it was her choice to go to ED-states she didn't go-states she is being treated for shingles, left chest which are internal-no CP now, states it comes and goes-states when she moves a certain way she has central CP

## 2018-09-22 NOTE — ED Notes (Addendum)
Patient transported to [facility].

## 2018-09-22 NOTE — ED Notes (Signed)
Patient transported to CT 

## 2018-09-22 NOTE — ED Provider Notes (Signed)
[facility] DEPT Provider Note   CSN: [id number] Arrival date & time: 09/22/18  1038     History   Chief Complaint Chief Complaint  Patient presents with  . Chest Pain  . facial numbness    HPI Robin Arellano is a 67 y.o. female.  Resents to the emergency department with 2 main complaints.  She is been troubled with on and off chest pain for over a year that seems very positional.  She saw her PCP on Saturday and he told her that she probably had an MI sometime in the past.  This was determined from an EKG.  She also has been having about a week of intermittent numbness over her right lips and cheek.  It comes and goes.  There is also some numbness in her right first and second digit and sometimes on her left hand.  The history is provided by the patient.  Chest Pain   This is a recurrent problem. The problem occurs every several days. The problem has not changed since onset.The pain is associated with movement and raising an arm. The pain is present in the substernal region. The pain is moderate. The quality of the pain is described as sharp. The pain does not radiate. Associated symptoms include back pain, malaise/fatigue, numbness and weakness. Pertinent negatives include no abdominal pain, no diaphoresis, no fever, no nausea, no shortness of breath and no vomiting. She has tried nothing for the symptoms. The treatment provided no relief.  Her past medical history is significant for anxiety/panic attacks and MI.  Pertinent negatives for past medical history include no seizures.  Cerebrovascular Accident  This is a new problem. The current episode started more than 1 week ago. The problem occurs daily. The problem has not changed since onset.Associated symptoms include chest pain. Pertinent negatives include no abdominal pain and no shortness of breath. Nothing aggravates the symptoms. Nothing relieves the symptoms. She has tried nothing for the symptoms. The  treatment provided no relief.    Past Medical History:  Diagnosis Date  . ADHD   . Anxiety   . Back pain   . Chronic female pelvic pain   . Depression   . Fibromyalgia   . H/O leukocytosis   . Headache   . MI (myocardial infarction) ([place])    Pt states she did not have a MI- EKG was normal, was GERD  . Osteoarthritis   . Ovarian cyst, right   . Post-operative nausea and vomiting   . SVD (spontaneous vaginal delivery)    x 2  . Vitamin D deficiency     Patient Active Problem List   Diagnosis Date Noted  . History of colonic polyps 02/12/2018  . LUQ abdominal pain 02/07/2018  . Herpes zoster without complication 53/29/9242  . Abdominal pain 02/07/2018  . Abdominal pain, epigastric 02/07/2018  . Low back pain 07/09/2013  . Neck pain 07/09/2013  . Headache(784.0) 07/09/2013  . Fibromyalgia 07/09/2013    Past Surgical History:  Procedure Laterality Date  . ABDOMINAL HYSTERECTOMY  1994   [facility]  . COLONOSCOPY  08/12/2017   Hx polyp/[place]  . KNEE SURGERY Bilateral 1996   x 2 - arthroscopic  . PELVIC LAPAROSCOPY  1989   W LYSIS OF ADHESIONS/L SALPINGONEOSTOMY  . WISDOM TOOTH EXTRACTION       OB History    Gravida  2   Para  2   Term      Preterm  AB      Living  2     SAB      TAB      Ectopic      Multiple      Live Births               Home Medications    Prior to Admission medications   Medication Sig Start Date End Date Taking? Authorizing Provider  ALPRAZolam Duanne Moron) 0.5 MG tablet Take 0.5 mg by mouth at bedtime as needed for anxiety.  01/09/14   [provider]  amphetamine-dextroamphetamine (ADDERALL) 20 MG tablet TK 1 T PO  TID 08/01/18   [provider]  Ascorbic Acid (VITAMIN C) 1000 MG tablet Take 1,000 mg by mouth daily.    [provider]  butalbital-acetaminophen-caffeine (FIORICET, ESGIC) 50-325-40 MG tablet Take 1 tablet by mouth as needed.  05/09/17   [provider]  cholecalciferol (VITAMIN D) 1000 units tablet Take 2,000 Units by mouth daily.     [provider]  citalopram (CELEXA) 20 MG tablet  09/02/18   [provider]  Cyanocobalamin (VITAMIN B-12 PO) Take 1 mg by mouth See admin instructions. Takes every Saturday    [provider]  estradiol (ESTRACE) 0.5 MG tablet Take 1 tablet (0.5 mg total) by mouth daily. 02/26/18   Huel Cote, NP  HYDROcodone-acetaminophen (NORCO) 10-325 MG tablet Take 1-2 tablets by mouth 2 (two) times daily as needed for pain.  05/12/17   [provider]  MILK THISTLE PO Take by mouth. Take 525 mg twice a week    [provider]  NON FORMULARY Protein Kosher Collegen-Take 2 tabs with water every night    [provider]  valACYclovir (VALTREX) 500 MG tablet Take one tablet by mouth twice daily for 3-5 days then daily as needed. 02/26/18   Huel Cote, NP  vitamin E (VITAMIN E) 400 UNIT capsule Take 400 Units by mouth every Saturday.    [provider]  zolpidem (AMBIEN) 10 MG tablet Take 10 mg by mouth at bedtime as needed.  01/09/14   [provider]    Family History Family History  Problem Relation Age of Onset  . Cancer Mother        UTERINE  . Aneurysm Father   . Heart disease Paternal Grandfather   . COPD Brother   . Skin cancer Brother   . Skin cancer Brother   . Colon cancer Neg Hx   . Rectal cancer Neg Hx   . Stomach cancer Neg Hx     Social History Social History   Tobacco Use  . Smoking status: Former Smoker    Packs/day: 0.15    Years: 20.00    Pack years: 3.00    Types: Cigarettes    Last attempt to quit: 11/12/1998    Years since quitting: 19.8  . Smokeless tobacco: Never Used  Substance Use Topics  . Alcohol use: No  . Drug use: No     Allergies   Prednisone and Latex   Review of Systems Review of Systems  Constitutional: Positive for malaise/fatigue. Negative for diaphoresis and fever.  HENT:  Negative for sore throat.   Eyes: Negative for visual disturbance.  Respiratory: Negative for shortness of breath.   Cardiovascular: Positive for chest pain.  Gastrointestinal: Negative for abdominal pain, nausea and vomiting.  Genitourinary: Negative for dysuria.  Musculoskeletal: Positive for back pain.  Skin: Negative for rash.  Neurological: Positive for  weakness and numbness. Negative for seizures, facial asymmetry and speech difficulty.     Physical Exam Updated Vital Signs BP (!) 187/100 (BP Location: Right Arm)   Pulse 61   Temp 98.4 F (36.9 C) (Oral)   Resp 16   LMP  (LMP Unknown)   SpO2 100%   Physical Exam  Constitutional: She is oriented to person, place, and time. She appears well-developed and well-nourished. No distress.  HENT:  Head: Normocephalic and atraumatic.  Eyes: Conjunctivae are normal.  Neck: Neck supple.  Cardiovascular: Normal rate and regular rhythm.  No murmur heard. Pulmonary/Chest: Effort normal and breath sounds normal. No respiratory distress.  Abdominal: Soft. There is no tenderness.  Musculoskeletal: She exhibits no edema.       Right lower leg: Normal. She exhibits no tenderness.       Left lower leg: Normal. She exhibits no tenderness.  Neurological: She is alert and oriented to person, place, and time. She has normal strength. No cranial nerve deficit or sensory deficit. GCS eye subscore is 4. GCS verbal subscore is 5. GCS motor subscore is 6.  Skin: Skin is warm and dry.  Psychiatric: She has a normal mood and affect.  Nursing note and vitals reviewed.    ED Treatments / Results  Labs (all labs ordered are listed, but only abnormal results are displayed) Labs Reviewed  BASIC METABOLIC PANEL - Abnormal; Notable for the following components:      Result Value   Glucose, Bld 102 (*)    Creatinine, Ser 1.03 (*)    GFR calc non Af Amer 55 (*)    All other components within normal limits  CBC - Abnormal; Notable for the following  components:   WBC 12.8 (*)    RBC 3.82 (*)    MCV 101.6 (*)    All other components within normal limits  URINALYSIS, ROUTINE W REFLEX MICROSCOPIC - Abnormal; Notable for the following components:   Ketones, ur 5 (*)    Leukocytes, UA TRACE (*)    Bacteria, UA MANY (*)    All other components within normal limits  I-STAT TROPONIN, ED    EKG EKG Interpretation  Date/Time:  Monday September 22 2018 10:53:49 EST Ventricular Rate:  57 PR Interval:    QRS Duration: 93 QT Interval:  426 QTC Calculation: 415 R Axis:   87 Text Interpretation:  Sinus rhythm Borderline right axis deviation Baseline wander in lead(s) II similar to prior 7/18 Confirmed by Aletta Edouard [phone]) on 09/22/2018 11:00:28 AM   Radiology Dg Chest 2 View  Result Date: 09/22/2018 CLINICAL DATA:  Chest pain and RIGHT facial numbness for 5 days, history MI, former smoker EXAM: CHEST - 2 VIEW COMPARISON:  05/23/2017 FINDINGS: Upper normal heart size. Mediastinal contours and pulmonary vascularity normal. Atherosclerotic calcification aorta. Lungs clear. No pulmonary infiltrate, pleural effusion or pneumothorax. No acute osseous findings. IMPRESSION: No acute abnormalities. Electronically Signed   By: Lavonia Dana M.D.   On: 09/22/2018 11:42   Ct Head Wo Contrast  Result Date: 09/22/2018 CLINICAL DATA:  LEFT facial numbness for 5 days, some dizziness, focal neural deficit of greater than 6 hours stroke suspected, former smoker, history MI EXAM: CT HEAD WITHOUT CONTRAST TECHNIQUE: Contiguous axial images were obtained from the base of the skull through the vertex without intravenous contrast. Sagittal and coronal MPR images reconstructed from axial data set. COMPARISON:  None FINDINGS: Brain: Generalized atrophy. Normal ventricular morphology. No midline shift or mass effect. Small vessel chronic ischemic  changes of deep cerebral white matter. No intracranial hemorrhage, mass lesion, evidence of acute infarction, or  extra-axial fluid collection. Vascular: No hyperdense vessels. Skull: Intact Sinuses/Orbits: Clear Other: N/A IMPRESSION: Atrophy with small vessel chronic ischemic changes of deep cerebral white matter. No acute intracranial abnormalities. Electronically Signed   By: Lavonia Dana M.D.   On: 09/22/2018 12:12    Procedures Procedures (including critical care time)  Medications Ordered in ED Medications - No data to display   Initial Impression / Assessment and Plan / ED Course  I have reviewed the triage vital signs and the nursing notes.  Pertinent labs & imaging results that were available during my care of the patient were reviewed by me and considered in my medical decision making (see chart for details).  Clinical Course as of Sep 23 812  [facility] Sep 22, 2018  1317 I reviewed the patient's results with her.  I think it would be reasonable to cover the UTI as that may account for some of her fatigue.  I offered her ordering her an MRI here in the department but told her we cannot be sure that it would not be many hours before she would get it.  Ultimately she decided she would rather go home and get set up with an appointment for cardiology and have her doctor order the MRI.  She states she has a lot of intolerances to medications but she is taken Macrobid before so we will cover with that.  She is also asking for prescription for some Diflucan   [MB]    Clinical Course User Index [MB] Hayden Rasmussen, MD     Final Clinical Impressions(s) / ED Diagnoses   Final diagnoses:  Nonspecific chest pain  Fatigue, unspecified type  Paresthesia    ED Discharge Orders    None       Hayden Rasmussen, MD 09/23/18 [phone]

## 2018-09-29 ENCOUNTER — Telehealth: Payer: Self-pay | Admitting: Gastroenterology

## 2018-09-29 ENCOUNTER — Telehealth: Payer: Self-pay | Admitting: Diagnostic Neuroimaging

## 2018-09-29 NOTE — Telephone Encounter (Signed)
Fine with me

## 2018-09-29 NOTE — Telephone Encounter (Signed)
We received a new referral on pt for numbness, and she saw Dr. Leta Baptist back in 2015, but is wanting to see Dr. Jaynee Eagles for this. Would you both be ok with the switch?

## 2018-09-29 NOTE — Telephone Encounter (Signed)
Ok, thanks.  No charge 

## 2018-09-30 ENCOUNTER — Encounter: Payer: PPO | Admitting: Gastroenterology

## 2018-10-06 ENCOUNTER — Encounter: Payer: Self-pay | Admitting: Cardiovascular Disease

## 2018-10-06 ENCOUNTER — Ambulatory Visit: Payer: PPO | Admitting: Cardiovascular Disease

## 2018-10-06 VITALS — BP 142/75 | HR 66 | Ht 62.0 in | Wt 174.0 lb

## 2018-10-06 DIAGNOSIS — F419 Anxiety disorder, unspecified: Secondary | ICD-10-CM

## 2018-10-06 DIAGNOSIS — E7849 Other hyperlipidemia: Secondary | ICD-10-CM

## 2018-10-06 DIAGNOSIS — M940 Chondrocostal junction syndrome [Tietze]: Secondary | ICD-10-CM | POA: Diagnosis not present

## 2018-10-06 DIAGNOSIS — R200 Anesthesia of skin: Secondary | ICD-10-CM | POA: Diagnosis not present

## 2018-10-06 DIAGNOSIS — E785 Hyperlipidemia, unspecified: Secondary | ICD-10-CM | POA: Diagnosis not present

## 2018-10-06 DIAGNOSIS — R079 Chest pain, unspecified: Secondary | ICD-10-CM

## 2018-10-06 DIAGNOSIS — Z8619 Personal history of other infectious and parasitic diseases: Secondary | ICD-10-CM | POA: Diagnosis not present

## 2018-10-06 DIAGNOSIS — Z01812 Encounter for preprocedural laboratory examination: Secondary | ICD-10-CM

## 2018-10-06 DIAGNOSIS — F329 Major depressive disorder, single episode, unspecified: Secondary | ICD-10-CM | POA: Diagnosis not present

## 2018-10-06 DIAGNOSIS — F32A Depression, unspecified: Secondary | ICD-10-CM

## 2018-10-06 MED ORDER — AMLODIPINE BESYLATE 5 MG PO TABS: 5.0000 mg | ORAL_TABLET | Freq: Every day | ORAL | 3 refills | Status: DC

## 2018-10-06 MED ORDER — ROSUVASTATIN CALCIUM 20 MG PO TABS: 20.0000 mg | ORAL_TABLET | Freq: Every day | ORAL | 3 refills | Status: DC

## 2018-10-06 NOTE — Progress Notes (Signed)
Cardiology Office Note    Date:  10/13/2018   ID:  Robin Arellano, [place] Jul 21, 1951, MRN [id number]  PCP:  Sandi Mariscal, MD  Cardiologist:  Shelva Majestic, MD   Chief Complaint  Patient presents with     New cardiology evaluation by Dr.Yun Sun at [facility] at [place]  History of Present Illness:  Robin Arellano is a 67 y.o. female who apparently I had seen over 25 years ago in early 1994 with atypical chest pain.  I do not have any port from that evaluation over 25 years ago.  She was recently seen in September 22, 2018 by Dr. Aletta Edouard at [facility] ER with complaints of intermittent chest pain has been occurring off and on over the past year.  Prior to that evaluation, she was evaluated by her PCP and was told that she may have had an old heart attack based on her ECG.  During her ER evaluation she was significantly hypertensive with a blood pressure 187/100.  A chest x-ray did not show any acute abnormalities.  She had experienced some transient left facial numbness for 5 days associated with some dizziness and a CT was done which revealed atrophy with small vessel chronic ischemic changes of deep cerebral white matter without acute intracranial abnormalities.  She has recently been evaluated at [facility] on [address] by Dr. Theressa Millard and she is felt to have prediabetes.  Lipid studies were increased with a total cholesterol of 319, triglycerides 209, LDL cholesterol 224, and she was told to initiate Crestor 10 mg which she has not yet started.  Her sedimentation rate was elevated at 64.  ANA was negative.  RF was normal.  Thyroid studies were normal.  Because of her recent symptomatology, she is referred for cardiology evaluation.  Only, she complains of some intermittent chest discomfort which occurs in the center of her chest and seems to radiate to her left shoulder. It is aggravated when she turns a certain way and may last for hours and then ultimately resolves on its own.  She  was told that this may be from "Iinside shingles."   She denies any exertional symptomatology.  Additional history is notable for chronic bilateral low back pain with bilateral sciatica.  She also states she has fibromyalgia and osteoarthritis.  Has a history of anxiety and depression.  Past Medical History:  Diagnosis Date  . ADHD   . Anxiety   . Back pain   . Chronic female pelvic pain   . Depression   . Fibromyalgia   . H/O leukocytosis   . Headache   . MI (myocardial infarction) ([place])    Pt states she did not have a MI- EKG was normal, was GERD  . Osteoarthritis   . Ovarian cyst, right   . Post-operative nausea and vomiting   . SVD (spontaneous vaginal delivery)    x 2  . Vitamin D deficiency     Past Surgical History:  Procedure Laterality Date  . ABDOMINAL HYSTERECTOMY  1994   TAH.[place]  . COLONOSCOPY  08/12/2017   Hx polyp/[place]  . KNEE SURGERY Bilateral 1996   x 2 - arthroscopic  . PELVIC LAPAROSCOPY  1989   W LYSIS OF ADHESIONS/L SALPINGONEOSTOMY  . WISDOM TOOTH EXTRACTION      Current Medications: Outpatient Medications Prior to Visit  Medication Sig Dispense Refill  . ALPRAZolam (XANAX) 0.5 MG tablet Take 0.5 mg by mouth at bedtime as needed for anxiety.     Marland Kitchen  amphetamine-dextroamphetamine (ADDERALL) 20 MG tablet Take 20 mg by mouth daily as needed.   0  . Ascorbic Acid (VITAMIN C) 1000 MG tablet Take 1,000 mg by mouth daily.    Marland Kitchen aspirin EC 81 MG tablet Take 81 mg by mouth daily.    . butalbital-acetaminophen-caffeine (FIORICET, ESGIC) 50-325-40 MG tablet Take 1 tablet by mouth as needed.   0  . cholecalciferol (VITAMIN D) 1000 units tablet Take 2,000 Units by mouth daily.     . citalopram (CELEXA) 20 MG tablet Take 20 mg by mouth daily.     . Cyanocobalamin (VITAMIN B-12 PO) Take 1 mg by mouth See admin instructions. Takes every Saturday    . estradiol (ESTRACE) 0.5 MG tablet Take 1 tablet (0.5 mg total) by mouth daily. (Patient  taking differently: Take 0.25 mg by mouth daily. ) 90 tablet 4  . HYDROcodone-acetaminophen (NORCO) 10-325 MG tablet Take 0.5 tablets by mouth daily as needed for moderate pain.   0  . metoprolol tartrate (LOPRESSOR) 25 MG tablet Take 25 mg by mouth 2 (two) times daily.  2  . MILK THISTLE PO Take by mouth. Take 525 mg twice a week    . nitrofurantoin, macrocrystal-monohydrate, (MACROBID) 100 MG capsule Take 1 capsule (100 mg total) by mouth 2 (two) times daily. 10 capsule 0  . NON FORMULARY Protein Kosher Collegen-Take 2 tabs with water every night    . valACYclovir (VALTREX) 500 MG tablet Take one tablet by mouth twice daily for 3-5 days then daily as needed. 90 tablet 3  . vitamin E (VITAMIN E) 400 UNIT capsule Take 400 Units by mouth every Saturday.    . zolpidem (AMBIEN) 10 MG tablet Take 10 mg by mouth at bedtime as needed.      No facility-administered medications prior to visit.      Allergies:   Prednisone and Latex   Social History   Socioeconomic History  . Marital status: Married    Spouse name: Robin Arellano  . Number of children: 2  . Years of education: College  . Highest education level: Not on file  Occupational History  . Occupation: Cabin crew  Social Needs  . Financial resource strain: Not on file  . Food insecurity:    Worry: Not on file    Inability: Not on file  . Transportation needs:    Medical: Not on file    Non-medical: Not on file  Tobacco Use  . Smoking status: Former Smoker    Packs/day: 0.15    Years: 20.00    Pack years: 3.00    Types: Cigarettes    Last attempt to quit: 11/12/1998    Years since quitting: 19.9  . Smokeless tobacco: Never Used  Substance and Sexual Activity  . Alcohol use: No  . Drug use: No  . Sexual activity: Yes    Birth control/protection: Post-menopausal, Surgical    Comment: HYSTERECTOMY  Lifestyle  . Physical activity:    Days per week: Not on file    Minutes per session: Not on file  . Stress: Not on file  Relationships    . Social connections:    Talks on phone: Not on file    Gets together: Not on file    Attends religious service: Not on file    Active member of club or organization: Not on file    Attends meetings of clubs or organizations: Not on file    Relationship status: Not on file  Other Topics Concern  .  Not on file  Social History Narrative  . Not on file    Social history is notable in that she is in her fourth marriage and currently has been  married for 16 years to her fourth husband.  She has 2 children, 2 grandchildren.  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  There is a higher tobacco history, she quit in 2002.  She does yoga intermittently.  Family History:  The patient's family history includes Aneurysm in her father; COPD in her brother; Cancer in her mother; Heart disease in her paternal grandfather; Skin cancer in her brother and brother.   Her mother died at age 48.  Her father died at age 67 and had blood clots.  A brother died at age 18 and was exposed to agent orange.  She has a brother age 32 with cancer.  She has 5 sisters.   ROS General: Negative; No fevers, chills, or night sweats;  HEENT: Negative; No changes in vision or hearing, sinus congestion, difficulty swallowing Pulmonary: Negative; No cough, wheezing, shortness of breath, hemoptysis Cardiovascular: See HPI GI: Negative; No nausea, vomiting, diarrhea, or abdominal pain GU: Recent UTI, just started on Macrobid Musculoskeletal: Chronic low back pain, osteo-arthritis, fibromyalgia Hematologic/Oncology: Negative; no easy bruising, bleeding Endocrine: Negative; no heat/cold intolerance; no diabetes Neuro: Negative; no changes in balance, headaches Skin: Negative; No rashes or skin lesions Psychiatric: Negative; No behavioral problems, depression Sleep: Negative; No snoring, daytime sleepiness, hypersomnolence, bruxism, restless legs, hypnogognic hallucinations, no cataplexy Other comprehensive 14 point  system review is negative.   PHYSICAL EXAM:   VS:  BP (!) 142/75   Pulse 66   Ht '5\' 2"'$  (1.575 m)   Wt 174 lb (78.9 kg)   LMP  (LMP Unknown)   BMI 31.83 kg/m     Repeat blood pressure by me was 170/88.  Wt Readings from Last 3 Encounters:  10/06/18 174 lb (78.9 kg)  09/16/18 178 lb 3.2 oz (80.8 kg)  02/18/18 170 lb (77.1 kg)    General: Alert, oriented, no distress.  Skin: normal turgor, no rashes, warm and dry HEENT: Normocephalic, atraumatic. Pupils equal round and reactive to light; sclera anicteric; extraocular muscles intact; Fundi no hemorrhages or exudates.  Discs flat.  No arcus senilis. Nose without nasal septal hypertrophy Mouth/Parynx benign; Mallinpatti scale 2 Neck: No JVD, no carotid bruits; normal carotid upstroke Lungs: clear to ausculatation and percussion; no wheezing or rales Chest wall: Left costochondral tenderness to palpation  Heart: PMI not displaced, RRR, s1 s2 normal, 1/6 systolic murmur in the upper left sternal border, no diastolic murmur, no rubs, gallops, thrills, or heaves Abdomen: soft, nontender; no hepatosplenomehaly, BS+; abdominal aorta nontender and not dilated by palpation. Back: no CVA tenderness Pulses 2+ Musculoskeletal: full range of motion, normal strength, no joint deformities Extremities: no clubbing cyanosis or edema, Homan's sign negative ; no tendon xanthomas Neurologic: grossly nonfocal; Cranial nerves grossly wnl Psychologic: Normal mood and affect   Studies/Labs Reviewed:   EKG:  EKG is ordered today. ECG (independently read by me): Normal sinus rhythm at 66 bpm.  Nondiagnostic Q waves in lead III and aVF.  Normal intervals.  No ectopy.  No ST segment changes.  Recent Labs: BMP Latest Ref Rng & Units 09/22/2018 02/07/2018 05/23/2017  Glucose 70 - 99 mg/dL 102(H) 112(H) 103(H)  BUN 8 - 23 mg/dL '23 23 17  '$ Creatinine 0.44 - 1.00 mg/dL 1.03(H) 0.99 0.81  Sodium 135 - 145 mmol/L 137 136 137  Potassium 3.5 - 5.1 mmol/L 4.3 3.9  4.1  Chloride 98 - 111 mmol/L 104 102 103  CO2 22 - 32 mmol/L '25 24 23  '$ Calcium 8.9 - 10.3 mg/dL 9.5 9.5 9.4     Hepatic Function Latest Ref Rng & Units 02/07/2018 05/23/2017  Total Protein 6.0 - 8.3 g/dL 8.1 7.8  Albumin 3.5 - 5.2 g/dL 4.1 4.0  AST 0 - 37 U/L 13 21  ALT 0 - 35 U/L 11 12(L)  Alk Phosphatase 39 - 117 U/L 81 84  Total Bilirubin 0.2 - 1.2 mg/dL 0.2 0.3  Bilirubin, Direct 0.1 - 0.5 mg/dL - <0.1(L)    CBC Latest Ref Rng & Units 09/22/2018 02/07/2018 05/23/2017  WBC 4.0 - 10.5 K/uL 12.8(H) 16.7(H) 15.6(H)  Hemoglobin 12.0 - 15.0 g/dL 12.4 12.2 13.0  Hematocrit 36.0 - 46.0 % 38.8 36.6 38.4  Platelets 150 - 400 K/uL 333 377.0 329   Lab Results  Component Value Date   MCV 101.6 (H) 09/22/2018   MCV 96.5 02/07/2018   MCV 98.0 05/23/2017   Lab Results  Component Value Date   TSH 1.62 02/07/2018   No results found for: HGBA1C   BNP No results found for: BNP  ProBNP No results found for: PROBNP   Lipid Panel  No results found for: CHOL, TRIG, HDL, CHOLHDL, VLDL, LDLCALC, LDLDIRECT   RADIOLOGY: Dg Chest 2 View  Result Date: 09/22/2018 CLINICAL DATA:  Chest pain and RIGHT facial numbness for 5 days, history MI, former smoker EXAM: CHEST - 2 VIEW COMPARISON:  05/23/2017 FINDINGS: Upper normal heart size. Mediastinal contours and pulmonary vascularity normal. Atherosclerotic calcification aorta. Lungs clear. No pulmonary infiltrate, pleural effusion or pneumothorax. No acute osseous findings. IMPRESSION: No acute abnormalities. Electronically Signed   By: Lavonia Dana M.D.   On: 09/22/2018 11:42   Ct Head Wo Contrast  Result Date: 09/22/2018 CLINICAL DATA:  LEFT facial numbness for 5 days, some dizziness, focal neural deficit of greater than 6 hours stroke suspected, former smoker, history MI EXAM: CT HEAD WITHOUT CONTRAST TECHNIQUE: Contiguous axial images were obtained from the base of the skull through the vertex without intravenous contrast. Sagittal and coronal  MPR images reconstructed from axial data set. COMPARISON:  None FINDINGS: Brain: Generalized atrophy. Normal ventricular morphology. No midline shift or mass effect. Small vessel chronic ischemic changes of deep cerebral white matter. No intracranial hemorrhage, mass lesion, evidence of acute infarction, or extra-axial fluid collection. Vascular: No hyperdense vessels. Skull: Intact Sinuses/Orbits: Clear Other: N/A IMPRESSION: Atrophy with small vessel chronic ischemic changes of deep cerebral white matter. No acute intracranial abnormalities. Electronically Signed   By: Lavonia Dana M.D.   On: 09/22/2018 12:12     Additional studies/ records that were reviewed today include:  Reviewed the records from the emergency room.  I reviewed the records from [place] [facility] at [facility] [place]:    1. Chest pain, unspecified type   2. Hyperlipidemia, unspecified hyperlipidemia type   3. Pre-procedure lab exam   4. Costochondritis   5. Possible Familial hyperlipidemia, high LDL at 224   6. Anxiety and depression   7. Facial numbness   8. History of shingles      PLAN:  Ms. Riniyah Bettcher is a 67 year old female who has experienced intermittent episodes of somewhat atypical chest discomfort.  Her chest pain has been occurring off and on for the past year typically is nonexertional.  Her ECG shows normal sinus rhythm.  She has small nondiagnostic inferior Q waves.  I do not believe she has had a prior myocardial infarction.  She has stage II hypertension and her blood pressure was significantly elevated at her emergency room evaluation.  I have recommended the addition of amlodipine 5 mg to her medical regimen.  With her significant hypertension of questionable duration, I am scheduling her for an echo Doppler study to evaluate LV systolic and diastolic function and valvular function.  Her lipid panel most recently done is suggestive of familial hyperlipidemia with marked  cholesterol at 319, LDL cholesterol 224, triglycerides 209, and HDL of 53.  She was advised to initiate Crestor but has not yet done this.  I have suggested she initiate treatment with Crestor 20 mg rather than 10 mg and I suspect she may require more aggressive therapy.  With her significant hyperlipidemia and her recurrent chest pain although with atypical features, I am recommending she undergo a coronary CTA to evaluate calcium score in addition to coronary plaque.  If felt to be of moderate stenosis FFR analysis will be undertaken.  He has been a chest wall tenderness and I suspect has a component of costochondritis contributing to her probable chest wall pain.  Had issues with anxiety depression for which she takes Celexa.  She also has had issues with fatigue but believes she is sleeping adequately and takes Xanax daily.  There is a history of shingles for which she is on Valtrex.  There is also a history of ADHD for which she is on Calderol.  She denies any associated palpitations.  She will monitor her blood pressure.  I will see her in 2 to 3 months for reevaluation or sooner if problems arise.   Medication Adjustments/Labs and Tests Ordered: Current medicines are reviewed at length with the patient today.  Concerns regarding medicines are outlined above.  Medication changes, Labs and Tests ordered today are listed in the Patient Instructions below. Patient Instructions  Medication Instructions:  INCREASE rosuvastatin (Crestor) to 20 mg daily START amlodipine 5 mg daily  If you need a refill on your cardiac medications before your next appointment, please call your pharmacy.   Lab work: 1 week prior to CT [facility]) If you have labs (blood work) drawn today and your tests are completely normal, you will receive your results only by: Marland Kitchen MyChart Message (if you have MyChart) OR . A paper copy in the mail If you have any lab test that is abnormal or we need to change your treatment, we will call  you to review the results.  Testing/Procedures: Your physician has requested that you have an echocardiogram. Echocardiography is a painless test that uses sound waves to create images of your heart. It provides your doctor with information about the size and shape of your heart and how well your heart's chambers and valves are working. This procedure takes approximately one hour. There are no restrictions for this procedure. This will be done at our [facility] location:  [place] has requested that you have cardiac CT. Cardiac computed tomography (CT) is a painless test that uses an x-ray machine to take clear, detailed pictures of your heart. For further information please visit HugeFiesta.tn. Please follow instruction sheet as given.  Follow-Up: At [facility], you and your health needs are our priority.  As part of our continuing mission to provide you with exceptional heart care, we have created designated Provider Care Teams.  These Care  Teams include your primary Cardiologist (physician) and Advanced Practice Providers (APPs -  Physician Assistants and Nurse Practitioners) who all work together to provide you with the care you need, when you need it. You will need a follow up appointment in 2 months.  Please call our office 2 months in advance to schedule this appointment.  You may see Dr. Claiborne Billings or one of the following Advanced Practice Providers on your designated Care Team: [place], [place] . Fabian Sharp, PA-C      Signed, Shelva Majestic, MD  10/13/2018 7:53 AM    [place] [address] [place], [place], [place]  [address] Phone: [phone]

## 2018-10-06 NOTE — Patient Instructions (Signed)
Medication Instructions:  INCREASE rosuvastatin (Crestor) to 20 mg daily START amlodipine 5 mg daily  If you need a refill on your cardiac medications before your next appointment, please call your pharmacy.   Lab work: 1 week prior to CT [employer] [facility] [employer]) If you have labs (blood work) drawn today and your tests are completely normal, you will receive your results only by: Marland Kitchen MyChart Message (if you have MyChart) OR . A paper copy in the mail If you have any lab test that is abnormal or we need to change your treatment, we will call you to review the results.  Testing/Procedures: Your physician has requested that you have an echocardiogram. Echocardiography is a painless test that uses sound waves to create images of your heart. It provides your doctor with information about the size and shape of your heart and how well your heart's chambers and valves are working. This procedure takes approximately one hour. There are no restrictions for this procedure. This will be done at our [facility] location:  [place] has requested that you have cardiac CT. Cardiac computed tomography (CT) is a painless test that uses an x-ray machine to take clear, detailed pictures of your heart. For further information please visit HugeFiesta.tn. Please follow instruction sheet as given.  Follow-Up: At [facility], you and your health needs are our priority.  As part of our continuing mission to provide you with exceptional heart care, we have created designated Provider Care Teams.  These Care Teams include your primary Cardiologist (physician) and Advanced Practice Providers (APPs -  Physician Assistants and Nurse Practitioners) who all work together to provide you with the care you need, when you need it. You will need a follow up appointment in 2 months.  Please call our office 2 months in advance to schedule this appointment.  You may see Dr. Claiborne Billings or one of the following  Advanced Practice Providers on your designated Care Team: [place], [place] . Fabian Sharp, PA-C

## 2018-10-13 ENCOUNTER — Encounter: Payer: Self-pay | Admitting: Cardiovascular Disease

## 2018-10-14 ENCOUNTER — Other Ambulatory Visit: Payer: Self-pay

## 2018-10-14 ENCOUNTER — Ambulatory Visit (HOSPITAL_COMMUNITY): Payer: PPO | Attending: Cardiology

## 2018-10-14 DIAGNOSIS — R079 Chest pain, unspecified: Secondary | ICD-10-CM | POA: Insufficient documentation

## 2018-10-30 DIAGNOSIS — M545 Low back pain: Secondary | ICD-10-CM | POA: Diagnosis not present

## 2018-10-30 DIAGNOSIS — M4317 Spondylolisthesis, lumbosacral region: Secondary | ICD-10-CM | POA: Diagnosis not present

## 2018-10-31 DIAGNOSIS — R079 Chest pain, unspecified: Secondary | ICD-10-CM | POA: Diagnosis not present

## 2018-10-31 DIAGNOSIS — Z01812 Encounter for preprocedural laboratory examination: Secondary | ICD-10-CM | POA: Diagnosis not present

## 2018-11-01 LAB — BASIC METABOLIC PANEL
BUN/Creatinine Ratio: 22 (ref 12–28)
BUN: 16 mg/dL (ref 8–27)
CO2: 21 mmol/L (ref 20–29)
Calcium: 9.6 mg/dL (ref 8.7–10.3)
Chloride: 106 mmol/L (ref 96–106)
Creatinine, Ser: 0.74 mg/dL (ref 0.57–1.00)
GFR calc Af Amer: 97 mL/min/{1.73_m2} (ref >59)
GFR calc non Af Amer: 84 mL/min/{1.73_m2} (ref >59)
Glucose: 99 mg/dL (ref 65–99)
Potassium: 4.7 mmol/L (ref 3.5–5.2)
Sodium: 141 mmol/L (ref 134–144)

## 2018-11-03 ENCOUNTER — Telehealth (HOSPITAL_COMMUNITY): Payer: Self-pay | Admitting: Emergency Medicine

## 2018-11-03 NOTE — Telephone Encounter (Signed)
Reaching out to patient to offer assistance regarding upcoming cardiac imaging study; unable to reach patient, left message on voicemail with name and call back number provided for further questions should they arise Marchia Bond RN Navigator Cardiac Imaging [phone]

## 2018-11-06 ENCOUNTER — Ambulatory Visit (HOSPITAL_COMMUNITY)
Admission: RE | Admit: 2018-11-06 | Discharge: 2018-11-06 | Disposition: A | Discharge status: Home / Self Care | Payer: PPO | Source: Ambulatory Visit | Attending: Cardiovascular Disease | Admitting: Cardiovascular Disease

## 2018-11-06 DIAGNOSIS — I2510 Atherosclerotic heart disease of native coronary artery without angina pectoris: Secondary | ICD-10-CM | POA: Diagnosis not present

## 2018-11-06 DIAGNOSIS — R079 Chest pain, unspecified: Secondary | ICD-10-CM

## 2018-11-06 DIAGNOSIS — I700 Atherosclerosis of aorta: Secondary | ICD-10-CM | POA: Insufficient documentation

## 2018-11-06 MED ORDER — NITROGLYCERIN 0.4 MG SL SUBL
SUBLINGUAL_TABLET | SUBLINGUAL | Status: AC
  Filled 2018-11-06: qty 2

## 2018-11-06 MED ORDER — IOPAMIDOL (ISOVUE-370) INJECTION 76%
100.0000 mL | Freq: Once | INTRAVENOUS | Status: AC | PRN
  Administered 2018-11-06: 100 mL via INTRAVENOUS

## 2018-11-06 MED ORDER — NITROGLYCERIN 0.4 MG SL SUBL
0.8000 mg | SUBLINGUAL_TABLET | SUBLINGUAL | Status: DC | PRN
  Administered 2018-11-06: 0.8 mg via SUBLINGUAL
  Filled 2018-11-06: qty 25

## 2018-11-12 HISTORY — PX: CARDIAC CATHETERIZATION: SHX172

## 2018-11-13 ENCOUNTER — Telehealth: Payer: Self-pay | Admitting: Cardiovascular Disease

## 2018-11-13 ENCOUNTER — Other Ambulatory Visit: Payer: Self-pay

## 2018-11-13 MED ORDER — ROSUVASTATIN CALCIUM 40 MG PO TABS: 40.0000 mg | ORAL_TABLET | Freq: Every day | ORAL | 1 refills | Status: DC

## 2018-11-13 NOTE — Telephone Encounter (Signed)
Called patient. Made appointment with Dr.Kelly per him okay to use 12:00 spot on 01/09

## 2018-11-13 NOTE — Telephone Encounter (Signed)
Follow Up:      Returning your call from 11-10-18, concerning her CT Test.

## 2018-11-20 ENCOUNTER — Ambulatory Visit: Payer: PPO | Admitting: Cardiovascular Disease

## 2018-11-20 ENCOUNTER — Encounter: Payer: Self-pay | Admitting: Cardiovascular Disease

## 2018-11-20 VITALS — BP 156/78 | HR 58 | Ht 62.5 in | Wt 173.5 lb

## 2018-11-20 DIAGNOSIS — M940 Chondrocostal junction syndrome [Tietze]: Secondary | ICD-10-CM

## 2018-11-20 DIAGNOSIS — F329 Major depressive disorder, single episode, unspecified: Secondary | ICD-10-CM

## 2018-11-20 DIAGNOSIS — F909 Attention-deficit hyperactivity disorder, unspecified type: Secondary | ICD-10-CM | POA: Diagnosis not present

## 2018-11-20 DIAGNOSIS — E7849 Other hyperlipidemia: Secondary | ICD-10-CM | POA: Diagnosis not present

## 2018-11-20 DIAGNOSIS — R931 Abnormal findings on diagnostic imaging of heart and coronary circulation: Secondary | ICD-10-CM

## 2018-11-20 DIAGNOSIS — F419 Anxiety disorder, unspecified: Secondary | ICD-10-CM | POA: Diagnosis not present

## 2018-11-20 DIAGNOSIS — F32A Depression, unspecified: Secondary | ICD-10-CM

## 2018-11-20 MED ORDER — EZETIMIBE 10 MG PO TABS: 10.0000 mg | ORAL_TABLET | Freq: Every day | ORAL | 1 refills | Status: DC

## 2018-11-20 MED ORDER — AMLODIPINE BESYLATE 10 MG PO TABS: 10.0000 mg | ORAL_TABLET | Freq: Every day | ORAL | 3 refills | Status: DC

## 2018-11-20 NOTE — Patient Instructions (Signed)
Medication Instructions:  Start Zetia 10 mg daily.  Increase Amlodipine 10 mg  Decrease Crestor to 10 mg, cut it in half for now. If you need a refill on your cardiac medications before your next appointment, please call your pharmacy.   Lab work: Fasting CMET, BMET, TSH, Lipid, CBC tomorrow. If you have labs (blood work) drawn today and your tests are completely normal, you will receive your results only by: Marland Kitchen MyChart Message (if you have MyChart) OR . A paper copy in the mail If you have any lab test that is abnormal or we need to change your treatment, we will call you to review the results.  Testing/Procedures: Your physician has requested that you have a cardiac catheterization. Cardiac catheterization is used to diagnose and/or treat various heart conditions. Doctors may recommend this procedure for a number of different reasons. The most common reason is to evaluate chest pain. Chest pain can be a symptom of coronary artery disease (CAD), and cardiac catheterization can show whether plaque is narrowing or blocking your heart's arteries. This procedure is also used to evaluate the valves, as well as measure the blood flow and oxygen levels in different parts of your heart. For further information please visit HugeFiesta.tn. Please follow instruction sheet, as given.  Follow-Up: At [facility], you and your health needs are our priority.  As part of our continuing mission to provide you with exceptional heart care, we have created designated Provider Care Teams.  These Care Teams include your primary Cardiologist (physician) and Advanced Practice Providers (APPs -  Physician Assistants and Nurse Practitioners) who all work together to provide you with the care you need, when you need it. . Follow up after Cath.   Any Other Special Instructions Will Be Listed Below (If Applicable).        [place] [address] Dept: [phone] Loc: [place]  11/20/2018  You are scheduled for a Cardiac Catheterization on Tuesday, January 14 with Dr. Shelva Majestic.  1. Please arrive at the [facility] (Main Entrance A) at [facility]: [address] [place], [place] [address] at 7:30 AM (This time is two hours before your procedure to ensure your preparation). Free valet parking service is available.   Special note: Every effort is made to have your procedure done on time. Please understand that emergencies sometimes delay scheduled procedures.  2. Diet: Do not eat solid foods after midnight.  The patient may have clear liquids until 5am upon the day of the procedure.  3. Labs: You will need to have blood drawn tomorrow  4. Medication instructions in preparation for your procedure:   On the morning of your procedure, take your Aspirin and any morning medicines NOT listed above.  You may use sips of water.  5. Plan for one night stay--bring personal belongings. 6. Bring a current list of your medications and current insurance cards. 7. You MUST have a responsible person to drive you home. 8. Someone MUST be with you the first 24 hours after you arrive home or your discharge will be delayed. 9. Please wear clothes that are easy to get on and off and wear slip-on shoes.  Thank you for allowing [place] to care for you!   -- [place] [facility]

## 2018-11-20 NOTE — H&P (View-Only) (Signed)
Cardiology Office Note    Date:  11/21/2018   ID:  Robin Arellano, Robin Arellano 1951-02-03, MRN [id number]  PCP:  Sandi Mariscal, MD  Cardiologist:  Shelva Majestic, MD   Chief Complaint  Patient presents with     F/U cardiology evaluation initally referred by Dr.Yun Sun at [facility] [place]  History of Present Illness:  Robin Arellano is a 68 y.o. female who was referred to me by Dr. Nancy Fetter and was seen by me on October 06, 2018.  She presents for follow-up evaluation.  I had seen remotely Robin Arellano over 25 years ago in early 1994 with atypical chest pain. She was recently evaluated in September 22, 2018 by Dr. Aletta Edouard at [facility] ER with complaints of intermittent chest pain has been occurring off and on over the past year.  Prior to that evaluation, she was evaluated by her PCP and was told that she may have had an old heart attack based on her ECG.  During her ER evaluation she was significantly hypertensive with a blood pressure 187/100.  A chest x-ray did not show any acute abnormalities.  She had experienced some transient left facial numbness for 5 days associated with some dizziness and a CT was done which revealed atrophy with small vessel chronic ischemic changes of deep cerebral white matter without acute intracranial abnormalities.  She has recently been evaluated at [facility] on [place] by Dr. Theressa Millard and she is felt to have prediabetes.  Lipid studies were increased with a total cholesterol of 319, triglycerides 209, LDL cholesterol 224, and she was told to initiate Crestor 10 mg which she has not yet started.  Her sedimentation rate was elevated at 64.  ANA was negative.  RF was normal.  Thyroid studies were normal.  Because of her recent symptomatology, she was referred for cardiology evaluation.  When I saw her in November 2019 she complained of some intermittent chest discomfort which occurs in the center of her chest and seems to radiate to her left shoulder. It is  aggravated when she turns a certain way and may last for hours and then ultimately resolves on its own.  She was told that this may be from "Iinside shingles."   She denied any exertional symptomatology.  Additional history is notable for chronic bilateral low back pain with bilateral sciatica.  She also  has fibromyalgia and osteoarthritis.  Has a history of anxiety and depression.  During my evaluation, I reviewed laboratory from her primary physician which suggested a high likelihood for familial hyperlipidemia with a total cholesterol at 319, LDL cholesterol at 224, triglycerides of 209, and HDL of 53.  With her recurrent chest pain which most likely had musculoskeletal features and her significant hyperlipidemia I recommended she undergo coronary CT a to evaluate potential coronary atherosclerosis and recommended an echo Doppler evaluation particularly with her hypertensive history.  The echo Doppler study was done on October 14, 2018 and showed a normal LV function and normal LV strain with an EF of 60 to 65%.  There was mild aortic valve sclerosis without stenosis, mild MR, and a mildly thickened atrial septum consistent with lipomatous hypertrophy.  She underwent coronary CTA on November 06, 2018 which showed moderate coronary plaque with moderate tubular atherosclerosis in the proximal RCA and mid RCA in the 50 to 69% range.  Left main coronary artery had mild plaque less than 25%.  Her LAD had mild proximal plaque in the 25 to 49% range, also had  a moderate long segment of atherosclerotic plaque in the mid LAD just beyond the second diagonal vessel of 50 to 69%.  She was felt to have possible severe stenosis in the distal LAD with 70 to 99% plaque.  She had a small caliber ramus intermediate vessel.  The circumflex had mild to moderate plaque of 25 to 49% proximally, and then 50 to 69% stenosis.  Subsequent FFR analysis not reveal any significant flow-limiting stenosis with the exception of the distal  LAD where the FFR was 0.57.   Presently, she denies any exertional chest pain.  Her chest pain has been recurring and is described as being sharp and knifelike.  Typically it is of short duration.  She also has recently started rosuvastatin 20 mg and has begun to notice some calf discomfort attributed to initiation of statin therapy.  She presents for reevaluation.  Past Medical History:  Diagnosis Date  . ADHD   . Anxiety   . Back pain   . Chronic female pelvic pain   . Depression   . Fibromyalgia   . H/O leukocytosis   . Headache   . MI (myocardial infarction) ([place])    Pt states she did not have a MI- EKG was normal, was GERD  . Osteoarthritis   . Ovarian cyst, right   . Post-operative nausea and vomiting   . SVD (spontaneous vaginal delivery)    x 2  . Vitamin D deficiency     Past Surgical History:  Procedure Laterality Date  . ABDOMINAL HYSTERECTOMY  1994   TAH.[place]  . COLONOSCOPY  08/12/2017   Hx polyp/[place]  . KNEE SURGERY Bilateral 1996   x 2 - arthroscopic  . PELVIC LAPAROSCOPY  1989   W LYSIS OF ADHESIONS/L SALPINGONEOSTOMY  . WISDOM TOOTH EXTRACTION      Current Medications: Outpatient Medications Prior to Visit  Medication Sig Dispense Refill  . ALPRAZolam (XANAX) 0.5 MG tablet Take 0.5 mg by mouth at bedtime.     . Ascorbic Acid (VITAMIN C) 1000 MG tablet Take 1,000 mg by mouth daily.    Marland Kitchen aspirin EC 81 MG tablet Take 81 mg by mouth daily.    . butalbital-acetaminophen-caffeine (FIORICET, ESGIC) 50-325-40 MG tablet Take 1 tablet by mouth every 6 (six) hours as needed for headache.   0  . cholecalciferol (VITAMIN D) 1000 units tablet Take 1,000 Units by mouth daily.     . citalopram (CELEXA) 20 MG tablet Take 20 mg by mouth at bedtime.     Marland Kitchen estradiol (ESTRACE) 0.5 MG tablet Take 1 tablet (0.5 mg total) by mouth daily. (Patient taking differently: Take 0.25 mg by mouth daily. ) 90 tablet 4  . HYDROcodone-acetaminophen (NORCO) 10-325 MG  tablet Take 0.5 tablets by mouth daily as needed for moderate pain.   0  . metoprolol tartrate (LOPRESSOR) 25 MG tablet Take 25 mg by mouth 2 (two) times daily.  2  . nitrofurantoin, macrocrystal-monohydrate, (MACROBID) 100 MG capsule Take 1 capsule (100 mg total) by mouth 2 (two) times daily. (Patient not taking: Reported on 11/20/2018) 10 capsule 0  . NON FORMULARY Take 2 Scoops by mouth at bedtime. Protein Kosher Collegen    . rosuvastatin (CRESTOR) 40 MG tablet Take 1 tablet (40 mg total) by mouth daily. (Patient not taking: Reported on 11/20/2018) 90 tablet 1  . valACYclovir (VALTREX) 500 MG tablet Take one tablet by mouth twice daily for 3-5 days then daily as needed. (Patient taking differently:  Take 500 mg by mouth daily. Take one tablet by mouth twice daily for 3-5 days then daily as needed.) 90 tablet 3  . zolpidem (AMBIEN) 10 MG tablet Take 10 mg by mouth at bedtime.     Marland Kitchen amLODipine (NORVASC) 5 MG tablet Take 1 tablet (5 mg total) by mouth daily. 90 tablet 3  . amphetamine-dextroamphetamine (ADDERALL) 20 MG tablet Take 20 mg by mouth daily as needed.   0  . Cyanocobalamin (VITAMIN B-12 PO) Take 1 mg by mouth See admin instructions. Takes every Saturday    . MILK THISTLE PO Take 525 mg by mouth 2 (two) times a week.     . vitamin E (VITAMIN E) 400 UNIT capsule Take 400 Units by mouth every Saturday.     No facility-administered medications prior to visit.      Allergies:   Prednisone and Latex   Social History   Socioeconomic History  . Marital status: Married    Spouse name: don  . Number of children: 2  . Years of education: College  . Highest education level: Not on file  Occupational History  . Occupation: Cabin crew  Social Needs  . Financial resource strain: Not on file  . Food insecurity:    Worry: Not on file    Inability: Not on file  . Transportation needs:    Medical: Not on file    Non-medical: Not on file  Tobacco Use  . Smoking status: Former Smoker     Packs/day: 0.15    Years: 20.00    Pack years: 3.00    Types: Cigarettes    Last attempt to quit: 11/12/1998    Years since quitting: 20.0  . Smokeless tobacco: Never Used  Substance and Sexual Activity  . Alcohol use: No  . Drug use: No  . Sexual activity: Yes    Birth control/protection: Post-menopausal, Surgical    Comment: HYSTERECTOMY  Lifestyle  . Physical activity:    Days per week: Not on file    Minutes per session: Not on file  . Stress: Not on file  Relationships  . Social connections:    Talks on phone: Not on file    Gets together: Not on file    Attends religious service: Not on file    Active member of club or organization: Not on file    Attends meetings of clubs or organizations: Not on file    Relationship status: Not on file  Other Topics Concern  . Not on file  Social History Narrative  . Not on file    Social history is notable in that she is in her fourth marriage and currently has been  married for 16 years to her fourth husband.  She has 2 children, 2 grandchildren.  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  There is a higher tobacco history, she quit in 2002.  She does yoga intermittently.  Family History:  The patient's family history includes Aneurysm in her father; COPD in her brother; Cancer in her mother; Heart disease in her paternal grandfather; Skin cancer in her brother and brother.   Her mother died at age 57.  Her father died at age 41 and had blood clots.  A brother died at age 40 and was exposed to agent orange.  She has a brother age 95 with cancer.  She has 5 sisters.   ROS General: Negative; No fevers, chills, or night sweats;  HEENT: Negative; No changes in vision or  hearing, sinus congestion, difficulty swallowing Pulmonary: Negative; No cough, wheezing, shortness of breath, hemoptysis Cardiovascular: See HPI GI: Negative; No nausea, vomiting, diarrhea, or abdominal pain GU: Recent UTI, just started on  Macrobid Musculoskeletal: Chronic low back pain, osteo-arthritis, fibromyalgia Hematologic/Oncology: Negative; no easy bruising, bleeding Endocrine: Negative; no heat/cold intolerance; no diabetes Neuro: Negative; no changes in balance, headaches Skin: Negative; No rashes or skin lesions Psychiatric: Negative; No behavioral problems, depression Sleep: Negative; No snoring, daytime sleepiness, hypersomnolence, bruxism, restless legs, hypnogognic hallucinations, no cataplexy Other comprehensive 14 point system review is negative.   PHYSICAL EXAM:   VS:  BP (!) 156/78   Pulse (!) 58   Ht 5' 2.5" (1.588 m)   Wt 173 lb 8 oz (78.7 kg)   LMP  (LMP Unknown)   BMI 31.23 kg/m     Pete blood pressure was 144/78  Wt Readings from Last 3 Encounters:  11/20/18 173 lb 8 oz (78.7 kg)  10/06/18 174 lb (78.9 kg)  09/16/18 178 lb 3.2 oz (80.8 kg)    General: Alert, oriented, no distress.  Skin: normal turgor, no rashes, warm and dry HEENT: Normocephalic, atraumatic. Pupils equal round and reactive to light; sclera anicteric; extraocular muscles intact;  Nose without nasal septal hypertrophy Mouth/Parynx benign; Mallinpatti scale 2 Neck: No JVD, no carotid bruits; normal carotid upstroke Lungs: clear to ausculatation and percussion; no wheezing or rales Chest wall: Tenderness over the left costochondral region so over the left upper lateral pectoral region. Heart: PMI not displaced, RRR, s1 s2 normal, 1/6 systolic murmur, no diastolic murmur, no rubs, gallops, thrills, or heaves Abdomen: soft, nontender; no hepatosplenomehaly, BS+; abdominal aorta nontender and not dilated by palpation. Back: no CVA tenderness Pulses 2+ Musculoskeletal: full range of motion, normal strength, no joint deformities Extremities: no clubbing cyanosis or edema, Homan's sign negative  Neurologic: grossly nonfocal; Cranial nerves grossly wnl Psychologic: Normal mood and affect   Studies/Labs Reviewed:   EKG:  EKG  is ordered today. Sinus Bradycardia at 58;Q wave III, no STT changes  October 06, 2018 ECG (independently read by me): Normal sinus rhythm at 66 bpm.  Nondiagnostic Q waves in lead III and aVF.  Normal intervals.  No ectopy.  No ST segment changes.  Recent Labs: BMP Latest Ref Rng & Units 10/31/2018 09/22/2018 02/07/2018  Glucose 65 - 99 mg/dL 99 102(H) 112(H)  BUN 8 - 27 mg/dL '16 23 23  '$ Creatinine 0.57 - 1.00 mg/dL 0.74 1.03(H) 0.99  BUN/Creat Ratio 12 - 28 22 - -  Sodium 134 - 144 mmol/L 141 137 136  Potassium 3.5 - 5.2 mmol/L 4.7 4.3 3.9  Chloride 96 - 106 mmol/L 106 104 102  CO2 20 - 29 mmol/L '21 25 24  '$ Calcium 8.7 - 10.3 mg/dL 9.6 9.5 9.5     Hepatic Function Latest Ref Rng & Units 02/07/2018 05/23/2017  Total Protein 6.0 - 8.3 g/dL 8.1 7.8  Albumin 3.5 - 5.2 g/dL 4.1 4.0  AST 0 - 37 U/L 13 21  ALT 0 - 35 U/L 11 12(L)  Alk Phosphatase 39 - 117 U/L 81 84  Total Bilirubin 0.2 - 1.2 mg/dL 0.2 0.3  Bilirubin, Direct 0.1 - 0.5 mg/dL - <0.1(L)    CBC Latest Ref Rng & Units 09/22/2018 02/07/2018 05/23/2017  WBC 4.0 - 10.5 K/uL 12.8(H) 16.7(H) 15.6(H)  Hemoglobin 12.0 - 15.0 g/dL 12.4 12.2 13.0  Hematocrit 36.0 - 46.0 % 38.8 36.6 38.4  Platelets 150 - 400 K/uL 333 377.0 329  Lab Results  Component Value Date   MCV 101.6 (H) 09/22/2018   MCV 96.5 02/07/2018   MCV 98.0 05/23/2017   Lab Results  Component Value Date   TSH 1.62 02/07/2018   No results found for: HGBA1C   BNP No results found for: BNP  ProBNP No results found for: PROBNP   Lipid Panel  No results found for: CHOL, TRIG, HDL, CHOLHDL, VLDL, LDLCALC, LDLDIRECT   RADIOLOGY: Ct Coronary Morph W/cta Cor W/score W/ca W/cm &/or Wo/cm  Addendum Date: 11/07/2018   ADDENDUM REPORT: 11/07/2018 09:02 HISTORY: chest pain, HLD EXAM: Cardiac/Coronary  CT TECHNIQUE: The patient was scanned on a [employer]. PROTOCOL: A 120 kV prospective scan was triggered in the descending thoracic aorta at 111 HU's.  Axial non-contrast 3 mm slices were carried out through the heart. The data set was analyzed on a dedicated work station and scored using the [place]. Gantry rotation speed was 250 msecs and collimation was .6 mm. The 3D data set was reconstructed in 5% intervals of the 67-82 % of the R-R cycle. Diastolic phases were analyzed on a dedicated work station using MPR, MIP and VRT modes. The patient received 80 cc of contrast. FINDINGS: Coronary calcium score: The patient's coronary artery calcium score is 4, which places the patient in the 51 percentile. This is average for what is expected compared to age and gender matched peers. Coronary arteries: Normal coronary origins.  Right dominance. Right Coronary Artery: Moderate tubular atherosclerotic plaque in the proximal RCA, and moderate tubular atherosclerotic plaque in the mid RCA, both 50-69% stenosis. Left Main Coronary Artery: Minimal atherosclerotic plaque in the distal LM (< 25% stenosis). Left Anterior Descending Coronary Artery: Mild atherosclerotic plaque in the proximal LAD (25-49% stenosis). Moderate long segment atherosclerotic plaque in the mid LAD just beyond the second, medium caliber diagonal branch (50-69% stenosis). The distal LAD is small caliber with a possible severe tubular atherosclerotic plaque with 70-99% stenosis. Ramus Intermedius: Small caliber ramus intermedius branch appears patent. Left Circumflex Artery: Mild tubular atherosclerotic plaque in the proximal circumflex (25-49% stenosis). Moderate plaque in the proximal circumflex (50-69% stenosis), just before the takeoff of the first OM. Possible moderate stenosis at the ostium of the OM1, 50-69% stenosis. Distal circumflex is small and tortuous. Aorta:  Normal size.  No calcifications.  No dissection. Aortic Valve: No calcifications. Other findings: Normal pulmonary vein drainage into the left atrium. Normal left atrial appendage without a thrombus. Normal size of the pulmonary  artery. Likely patent foramen ovale with small left to right shunt. IMPRESSION: 1. The patient's coronary artery calcium score is 4, which places the patient in the 51 percentile. This is average for what is expected compared to age and gender matched peers. 2. Normal coronary origin with right dominance. 3. Severe CAD in the distal LAD, CADRADS = 4. Moderate CAD in the proximal RCA, mid LAD, and proximal left circumflex artery. CT FFR analysis will be performed. Electronically Signed   By: Cherlynn Kaiser   On: 11/07/2018 09:02   Result Date: 11/07/2018 EXAM: OVER-READ INTERPRETATION  CT CHEST The following report is an over-read performed by radiologist Dr. Rolm Baptise of [facility] on 11/06/2018. This over-read does not include interpretation of cardiac or coronary anatomy or pathology. The coronary CTA interpretation by the cardiologist is attached. COMPARISON:  None. FINDINGS: Vascular: Heart is upper limits normal in size. Aorta is normal caliber. Marked irregular plaque noted in the mid to distal descending thoracic aorta, best  seen on sagittal reconstructed images. Mediastinum/Nodes: No adenopathy in the lower mediastinum or hila. Lungs/Pleura: Visualized lungs clear.  No effusions. Upper Abdomen: Imaging into the upper abdomen shows no acute findings. Musculoskeletal: Chest wall soft tissues are unremarkable. No acute bony abnormality. IMPRESSION: Irregular plaque formation in along the posterior wall of the mid and distal descending thoracic aorta. No acute extra cardiac abnormality. Electronically Signed: By: Rolm Baptise M.D. On: 11/06/2018 13:29   Ct Coronary Fractional Flow Reserve Data Prep  Result Date: 11/07/2018 EXAM: CT FFR ANALYSIS CLINICAL DATA:  chest pain, HLD FINDINGS: FFRct analysis was performed on the original cardiac CT angiogram dataset. Diagrammatic representation of the FFRct analysis is provided in a separate PDF document in PACS. This dictation was created  using the PDF document and an interactive 3D model of the results. 3D model is not available in the EMR/PACS. Normal FFR range is >0.80. 1. Left Main:  No significant stenosis. FFR = 0.98 2. LAD: No significant stenosis. Proximal FFR = 0.94, Mid FFR = 0.83, Distal FFR = 0.57 3. LCX: No significant stenosis. Proximal FFR = 0.87, Distal FFR = 0.81, OM1 = 0.83, OM2 = 0.82 4. RCA: No significant stenosis. Proximal FFR = 0.93, Mid FFR = 0.87 , Distal FFR = 0.85 IMPRESSION: 1. CT FFR analysis showed severe stenosis in the distal LAD. No other hemodynamically significant stenoses detected. Electronically Signed   By: Cherlynn Kaiser   On: 11/07/2018 09:06     Additional studies/ records that were reviewed today include:  I reviewed the records from the emergency room.  I reviewed the records from [place] [facility] by Arville Go.   ------------------------------------------------------------------- 10/14/2018 ECHO Study Conclusions  - Left ventricle: Average global longitudinal LV strain is normal   at -18.7% The cavity size was normal. Systolic function was   normal. The estimated ejection fraction was in the range of 60%   to 65%. Wall motion was normal; there were no regional wall   motion abnormalities. The study is not technically sufficient to   allow evaluation of LV diastolic function. - Aortic valve: Trileaflet; mildly thickened, mildly calcified   leaflets. - Mitral valve: There was mild regurgitation. - Atrial septum: There was increased thickness of the septum,   consistent with lipomatous hypertrophy.   ASSESSMENT:    1. Abnormal cardiac CT angiography   2. Costochondritis   3. Possible Familial hyperlipidemia, high LDL at 224   4. Anxiety and depression   5. Attention deficit hyperactivity disorder (ADHD), unspecified ADHD type      PLAN:  Robin. Ishanvi Arellano is a 68 year old female who has experienced intermittent episodes of somewhat atypical  chest discomfort.  Her chest pain has been occurring off and on for the past year typically is nonexertional.  Her ECG shows normal sinus rhythm.  She has small nondiagnostic inferior Q waves.  I do not believe she has had a prior myocardial infarction.  She has stage II hypertension and her blood pressure was significantly elevated at her emergency room evaluation.  When I initially saw her I recommended the addition of amlodipine to her medical regimen.  I reviewed her echo Doppler study with her in detail which reveals normal systolic without wall motion abnormalities and with normal global longitudinal strain.  There was evidence for mild aortic sclerosis without stenosis, mild MR, and probable lipomatous hypertrophy of her atrial septum.  She has continued to experience sharp stabbing-like chest pain and on physical examination today  she has definite tenderness to her costochondral region pectoral region suggesting her chest pain is most likely of musculoskeletal etiology.  I had a thorough discussion with her regarding her coronary CT angiogram which has demonstrated at least moderate multivessel coronary atherosclerosis.  The distal LAD lesion was felt to be hemodynamically significant on FFR analysis.  Discussed further titration of medical therapy versus definitive cardiac catheterization.  After much discussion she prefers to undergo definitive cardiac catheterization.I have reviewed the risks, indications, and alternatives to cardiac catheterization, possible angioplasty, and stenting with the patient. Risks include but are not limited to bleeding, infection, vascular injury, stroke, myocardial infection, arrhythmia, kidney injury, radiation-related injury in the case of prolonged fluoroscopy use, emergency cardiac surgery, and death. The patient understands the risks of serious complication is 1-2 in 0867 with diagnostic cardiac cath and 1-2% or less with angioplasty/stenting.  Suspect that she also has  familial hyperlipidemia based on her prior laboratory with markedly elevated LDL level.  There is no evidence of any tendon xanthomas.  She has now been on Crestor 20 mg but admits that she has noticed some leg weakness and calf discomfort.  Exam she did not have muscle tenderness to palpation of her lower extremities.  I have suggested she reduce her Crestor to 10 mg or take every other day and I will add Zetia 10 mg.  I suspect she may be a candidate for Repatha for PCSK9 inhibition for optimal treatment.  Blood pressure has improved with the addition of amlodipine to her medical regimen.  She continues to be on metoprolol 25 mg twice a day and amlodipine for blood pressure.  She continues to be on alderral for ADHD.  I will schedule her for cardiac catheterization to be done at [facility] on November 25, 2017.    Medication Adjustments/Labs and Tests Ordered: Current medicines are reviewed at length with the patient today.  Concerns regarding medicines are outlined above.  Medication changes, Labs and Tests ordered today are listed in the Patient Instructions below. Patient Instructions  Medication Instructions:  Start Zetia 10 mg daily.  Increase Amlodipine 10 mg  Decrease Crestor to 10 mg, cut it in half for now. If you need a refill on your cardiac medications before your next appointment, please call your pharmacy.   Lab work: Fasting CMET, BMET, TSH, Lipid, CBC tomorrow. If you have labs (blood work) drawn today and your tests are completely normal, you will receive your results only by: Marland Kitchen MyChart Message (if you have MyChart) OR . A paper copy in the mail If you have any lab test that is abnormal or we need to change your treatment, we will call you to review the results.  Testing/Procedures: Your physician has requested that you have a cardiac catheterization. Cardiac catheterization is used to diagnose and/or treat various heart conditions. Doctors may recommend this procedure for a  number of different reasons. The most common reason is to evaluate chest pain. Chest pain can be a symptom of coronary artery disease (CAD), and cardiac catheterization can show whether plaque is narrowing or blocking your heart's arteries. This procedure is also used to evaluate the valves, as well as measure the blood flow and oxygen levels in different parts of your heart. For further information please visit [email or web address]. Please follow instruction sheet, as given.  Follow-Up: At [facility], you and your health needs are our priority.  As part of our continuing mission to provide you with exceptional heart care, we have created  designated Provider Care Teams.  These Care Teams include your primary Cardiologist (physician) and Advanced Practice Providers (APPs -  Physician Assistants and Nurse Practitioners) who all work together to provide you with the care you need, when you need it. . Follow up after Cath.   Any Other Special Instructions Will Be Listed Below (If Applicable).        [address] [place] [address] Dept: [phone] Loc: [place]  11/20/2018  You are scheduled for a Cardiac Catheterization on Tuesday, January 14 with Dr. Shelva Majestic.  1. Please arrive at the [facility] (Main Entrance A) at [facility]: [address] [place], [place] [address] at 7:30 AM (This time is two hours before your procedure to ensure your preparation). Free valet parking service is available.   Special note: Every effort is made to have your procedure done on time. Please understand that emergencies sometimes delay scheduled procedures.  2. Diet: Do not eat solid foods after midnight.  The patient may have clear liquids until 5am upon the day of the procedure.  3. Labs: You will need to have blood drawn tomorrow  4. Medication instructions in  preparation for your procedure:   On the morning of your procedure, take your Aspirin and any morning medicines NOT listed above.  You may use sips of water.  5. Plan for one night stay--bring personal belongings. 6. Bring a current list of your medications and current insurance cards. 7. You MUST have a responsible person to drive you home. 8. Someone MUST be with you the first 24 hours after you arrive home or your discharge will be delayed. 9. Please wear clothes that are easy to get on and off and wear slip-on shoes.  Thank you for allowing [place] to care for you!   -- [facility]       Signed, Shelva Majestic, MD  11/21/2018 8:34 AM    [facility] [address], [place], [place], [place]  [address] Phone: [phone]

## 2018-11-20 NOTE — Progress Notes (Signed)
Cardiology Office Note    Date:  11/21/2018   ID:  Robin Arellano, [place] 1950-12-10, MRN [id number]  PCP:  Sandi Mariscal, MD  Cardiologist:  Shelva Majestic, MD   Chief Complaint  Patient presents with     F/U cardiology evaluation initally referred by Dr.Yun Sun at [facility] [place]  History of Present Illness:  Robin Arellano is a 68 y.o. female who was referred to me by Dr. Nancy Fetter and was seen by me on October 06, 2018.  She presents for follow-up evaluation.  I had seen remotely Robin Arellano over 25 years ago in early 1994 with atypical chest pain. She was recently evaluated in September 22, 2018 by Dr. Aletta Edouard at [facility] ER with complaints of intermittent chest pain has been occurring off and on over the past year.  Prior to that evaluation, she was evaluated by her PCP and was told that she may have had an old heart attack based on her ECG.  During her ER evaluation she was significantly hypertensive with a blood pressure 187/100.  A chest x-ray did not show any acute abnormalities.  She had experienced some transient left facial numbness for 5 days associated with some dizziness and a CT was done which revealed atrophy with small vessel chronic ischemic changes of deep cerebral white matter without acute intracranial abnormalities.  She has recently been evaluated at [facility] on [address] by Dr. Theressa Millard and she is felt to have prediabetes.  Lipid studies were increased with a total cholesterol of 319, triglycerides 209, LDL cholesterol 224, and she was told to initiate Crestor 10 mg which she has not yet started.  Her sedimentation rate was elevated at 64.  ANA was negative.  RF was normal.  Thyroid studies were normal.  Because of her recent symptomatology, she was referred for cardiology evaluation.  When I saw her in November 2019 she complained of some intermittent chest discomfort which occurs in the center of her chest and seems to radiate to her left shoulder. It is  aggravated when she turns a certain way and may last for hours and then ultimately resolves on its own.  She was told that this may be from "Iinside shingles."   She denied any exertional symptomatology.  Additional history is notable for chronic bilateral low back pain with bilateral sciatica.  She also  has fibromyalgia and osteoarthritis.  Has a history of anxiety and depression.  During my evaluation, I reviewed laboratory from her primary physician which suggested a high likelihood for familial hyperlipidemia with a total cholesterol at 319, LDL cholesterol at 224, triglycerides of 209, and HDL of 53.  With her recurrent chest pain which most likely had musculoskeletal features and her significant hyperlipidemia I recommended she undergo coronary CT a to evaluate potential coronary atherosclerosis and recommended an echo Doppler evaluation particularly with her hypertensive history.  The echo Doppler study was done on October 14, 2018 and showed a normal LV function and normal LV strain with an EF of 60 to 65%.  There was mild aortic valve sclerosis without stenosis, mild MR, and a mildly thickened atrial septum consistent with lipomatous hypertrophy.  She underwent coronary CTA on November 06, 2018 which showed moderate coronary plaque with moderate tubular atherosclerosis in the proximal RCA and mid RCA in the 50 to 69% range.  Left main coronary artery had mild plaque less than 25%.  Her LAD had mild proximal plaque in the 25 to 49% range, also had  a moderate long segment of atherosclerotic plaque in the mid LAD just beyond the second diagonal vessel of 50 to 69%.  She was felt to have possible severe stenosis in the distal LAD with 70 to 99% plaque.  She had a small caliber ramus intermediate vessel.  The circumflex had mild to moderate plaque of 25 to 49% proximally, and then 50 to 69% stenosis.  Subsequent FFR analysis not reveal any significant flow-limiting stenosis with the exception of the distal  LAD where the FFR was 0.57.   Presently, she denies any exertional chest pain.  Her chest pain has been recurring and is described as being sharp and knifelike.  Typically it is of short duration.  She also has recently started rosuvastatin 20 mg and has begun to notice some calf discomfort attributed to initiation of statin therapy.  She presents for reevaluation.  Past Medical History:  Diagnosis Date  . ADHD   . Anxiety   . Back pain   . Chronic female pelvic pain   . Depression   . Fibromyalgia   . H/O leukocytosis   . Headache   . MI (myocardial infarction) ([place])    Pt states she did not have a MI- EKG was normal, was GERD  . Osteoarthritis   . Ovarian cyst, right   . Post-operative nausea and vomiting   . SVD (spontaneous vaginal delivery)    x 2  . Vitamin D deficiency     Past Surgical History:  Procedure Laterality Date  . ABDOMINAL HYSTERECTOMY  1994   TAH.[place]  . COLONOSCOPY  08/12/2017   Hx polyp/[place]  . KNEE SURGERY Bilateral 1996   x 2 - arthroscopic  . PELVIC LAPAROSCOPY  1989   W LYSIS OF ADHESIONS/L SALPINGONEOSTOMY  . WISDOM TOOTH EXTRACTION      Current Medications: Outpatient Medications Prior to Visit  Medication Sig Dispense Refill  . ALPRAZolam (XANAX) 0.5 MG tablet Take 0.5 mg by mouth at bedtime.     . Ascorbic Acid (VITAMIN C) 1000 MG tablet Take 1,000 mg by mouth daily.    Marland Kitchen aspirin EC 81 MG tablet Take 81 mg by mouth daily.    . butalbital-acetaminophen-caffeine (FIORICET, ESGIC) 50-325-40 MG tablet Take 1 tablet by mouth every 6 (six) hours as needed for headache.   0  . cholecalciferol (VITAMIN D) 1000 units tablet Take 1,000 Units by mouth daily.     . citalopram (CELEXA) 20 MG tablet Take 20 mg by mouth at bedtime.     Marland Kitchen estradiol (ESTRACE) 0.5 MG tablet Take 1 tablet (0.5 mg total) by mouth daily. (Patient taking differently: Take 0.25 mg by mouth daily. ) 90 tablet 4  . HYDROcodone-acetaminophen (NORCO) 10-325 MG  tablet Take 0.5 tablets by mouth daily as needed for moderate pain.   0  . metoprolol tartrate (LOPRESSOR) 25 MG tablet Take 25 mg by mouth 2 (two) times daily.  2  . nitrofurantoin, macrocrystal-monohydrate, (MACROBID) 100 MG capsule Take 1 capsule (100 mg total) by mouth 2 (two) times daily. (Patient not taking: Reported on 11/20/2018) 10 capsule 0  . NON FORMULARY Take 2 Scoops by mouth at bedtime. Protein Kosher Collegen    . rosuvastatin (CRESTOR) 40 MG tablet Take 1 tablet (40 mg total) by mouth daily. (Patient not taking: Reported on 11/20/2018) 90 tablet 1  . valACYclovir (VALTREX) 500 MG tablet Take one tablet by mouth twice daily for 3-5 days then daily as needed. (Patient taking differently:  Take 500 mg by mouth daily. Take one tablet by mouth twice daily for 3-5 days then daily as needed.) 90 tablet 3  . zolpidem (AMBIEN) 10 MG tablet Take 10 mg by mouth at bedtime.     Marland Kitchen amLODipine (NORVASC) 5 MG tablet Take 1 tablet (5 mg total) by mouth daily. 90 tablet 3  . amphetamine-dextroamphetamine (ADDERALL) 20 MG tablet Take 20 mg by mouth daily as needed.   0  . Cyanocobalamin (VITAMIN B-12 PO) Take 1 mg by mouth See admin instructions. Takes every Saturday    . MILK THISTLE PO Take 525 mg by mouth 2 (two) times a week.     . vitamin E (VITAMIN E) 400 UNIT capsule Take 400 Units by mouth every Saturday.     No facility-administered medications prior to visit.      Allergies:   Prednisone and Latex   Social History   Socioeconomic History  . Marital status: Married    Spouse name: don  . Number of children: 2  . Years of education: College  . Highest education level: Not on file  Occupational History  . Occupation: Cabin crew  Social Needs  . Financial resource strain: Not on file  . Food insecurity:    Worry: Not on file    Inability: Not on file  . Transportation needs:    Medical: Not on file    Non-medical: Not on file  Tobacco Use  . Smoking status: Former Smoker     Packs/day: 0.15    Years: 20.00    Pack years: 3.00    Types: Cigarettes    Last attempt to quit: 11/12/1998    Years since quitting: 20.0  . Smokeless tobacco: Never Used  Substance and Sexual Activity  . Alcohol use: No  . Drug use: No  . Sexual activity: Yes    Birth control/protection: Post-menopausal, Surgical    Comment: HYSTERECTOMY  Lifestyle  . Physical activity:    Days per week: Not on file    Minutes per session: Not on file  . Stress: Not on file  Relationships  . Social connections:    Talks on phone: Not on file    Gets together: Not on file    Attends religious service: Not on file    Active member of club or organization: Not on file    Attends meetings of clubs or organizations: Not on file    Relationship status: Not on file  Other Topics Concern  . Not on file  Social History Narrative  . Not on file    Social history is notable in that she is in her fourth marriage and currently has been  married for 16 years to her fourth husband.  She has 2 children, 2 grandchildren.  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  There is a higher tobacco history, she quit in 2002.  She does yoga intermittently.  Family History:  The patient's family history includes Aneurysm in her father; COPD in her brother; Cancer in her mother; Heart disease in her paternal grandfather; Skin cancer in her brother and brother.   Her mother died at age 34.  Her father died at age 60 and had blood clots.  A brother died at age 83 and was exposed to agent orange.  She has a brother age 95 with cancer.  She has 5 sisters.   ROS General: Negative; No fevers, chills, or night sweats;  HEENT: Negative; No changes in vision or  hearing, sinus congestion, difficulty swallowing Pulmonary: Negative; No cough, wheezing, shortness of breath, hemoptysis Cardiovascular: See HPI GI: Negative; No nausea, vomiting, diarrhea, or abdominal pain GU: Recent UTI, just started on  Macrobid Musculoskeletal: Chronic low back pain, osteo-arthritis, fibromyalgia Hematologic/Oncology: Negative; no easy bruising, bleeding Endocrine: Negative; no heat/cold intolerance; no diabetes Neuro: Negative; no changes in balance, headaches Skin: Negative; No rashes or skin lesions Psychiatric: Negative; No behavioral problems, depression Sleep: Negative; No snoring, daytime sleepiness, hypersomnolence, bruxism, restless legs, hypnogognic hallucinations, no cataplexy Other comprehensive 14 point system review is negative.   PHYSICAL EXAM:   VS:  BP (!) 156/78   Pulse (!) 58   Ht 5' 2.5" (1.588 m)   Wt 173 lb 8 oz (78.7 kg)   LMP  (LMP Unknown)   BMI 31.23 kg/m     Pete blood pressure was 144/78  Wt Readings from Last 3 Encounters:  11/20/18 173 lb 8 oz (78.7 kg)  10/06/18 174 lb (78.9 kg)  09/16/18 178 lb 3.2 oz (80.8 kg)    General: Alert, oriented, no distress.  Skin: normal turgor, no rashes, warm and dry HEENT: Normocephalic, atraumatic. Pupils equal round and reactive to light; sclera anicteric; extraocular muscles intact;  Nose without nasal septal hypertrophy Mouth/Parynx benign; Mallinpatti scale 2 Neck: No JVD, no carotid bruits; normal carotid upstroke Lungs: clear to ausculatation and percussion; no wheezing or rales Chest wall: Tenderness over the left costochondral region so over the left upper lateral pectoral region. Heart: PMI not displaced, RRR, s1 s2 normal, 1/6 systolic murmur, no diastolic murmur, no rubs, gallops, thrills, or heaves Abdomen: soft, nontender; no hepatosplenomehaly, BS+; abdominal aorta nontender and not dilated by palpation. Back: no CVA tenderness Pulses 2+ Musculoskeletal: full range of motion, normal strength, no joint deformities Extremities: no clubbing cyanosis or edema, Homan's sign negative  Neurologic: grossly nonfocal; Cranial nerves grossly wnl Psychologic: Normal mood and affect   Studies/Labs Reviewed:   EKG:  EKG  is ordered today. Sinus Bradycardia at 58;Q wave III, no STT changes  October 06, 2018 ECG (independently read by me): Normal sinus rhythm at 66 bpm.  Nondiagnostic Q waves in lead III and aVF.  Normal intervals.  No ectopy.  No ST segment changes.  Recent Labs: BMP Latest Ref Rng & Units 10/31/2018 09/22/2018 02/07/2018  Glucose 65 - 99 mg/dL 99 102(H) 112(H)  BUN 8 - 27 mg/dL '16 23 23  '$ Creatinine 0.57 - 1.00 mg/dL 0.74 1.03(H) 0.99  BUN/Creat Ratio 12 - 28 22 - -  Sodium 134 - 144 mmol/L 141 137 136  Potassium 3.5 - 5.2 mmol/L 4.7 4.3 3.9  Chloride 96 - 106 mmol/L 106 104 102  CO2 20 - 29 mmol/L '21 25 24  '$ Calcium 8.7 - 10.3 mg/dL 9.6 9.5 9.5     Hepatic Function Latest Ref Rng & Units 02/07/2018 05/23/2017  Total Protein 6.0 - 8.3 g/dL 8.1 7.8  Albumin 3.5 - 5.2 g/dL 4.1 4.0  AST 0 - 37 U/L 13 21  ALT 0 - 35 U/L 11 12(L)  Alk Phosphatase 39 - 117 U/L 81 84  Total Bilirubin 0.2 - 1.2 mg/dL 0.2 0.3  Bilirubin, Direct 0.1 - 0.5 mg/dL - <0.1(L)    CBC Latest Ref Rng & Units 09/22/2018 02/07/2018 05/23/2017  WBC 4.0 - 10.5 K/uL 12.8(H) 16.7(H) 15.6(H)  Hemoglobin 12.0 - 15.0 g/dL 12.4 12.2 13.0  Hematocrit 36.0 - 46.0 % 38.8 36.6 38.4  Platelets 150 - 400 K/uL 333 377.0 329  Lab Results  Component Value Date   MCV 101.6 (H) 09/22/2018   MCV 96.5 02/07/2018   MCV 98.0 05/23/2017   Lab Results  Component Value Date   TSH 1.62 02/07/2018   No results found for: HGBA1C   BNP No results found for: BNP  ProBNP No results found for: PROBNP   Lipid Panel  No results found for: CHOL, TRIG, HDL, CHOLHDL, VLDL, LDLCALC, LDLDIRECT   RADIOLOGY: Ct Coronary Morph W/cta Cor W/score W/ca W/cm &/or Wo/cm  Addendum Date: 11/07/2018   ADDENDUM REPORT: 11/07/2018 09:02 HISTORY: chest pain, HLD EXAM: Cardiac/Coronary  CT TECHNIQUE: The patient was scanned on a [employer]. PROTOCOL: A 120 kV prospective scan was triggered in the descending thoracic aorta at 111 HU's.  Axial non-contrast 3 mm slices were carried out through the heart. The data set was analyzed on a dedicated work station and scored using the [place]. Gantry rotation speed was 250 msecs and collimation was .6 mm. The 3D data set was reconstructed in 5% intervals of the 67-82 % of the R-R cycle. Diastolic phases were analyzed on a dedicated work station using MPR, MIP and VRT modes. The patient received 80 cc of contrast. FINDINGS: Coronary calcium score: The patient's coronary artery calcium score is 4, which places the patient in the 51 percentile. This is average for what is expected compared to age and gender matched peers. Coronary arteries: Normal coronary origins.  Right dominance. Right Coronary Artery: Moderate tubular atherosclerotic plaque in the proximal RCA, and moderate tubular atherosclerotic plaque in the mid RCA, both 50-69% stenosis. Left Main Coronary Artery: Minimal atherosclerotic plaque in the distal LM (< 25% stenosis). Left Anterior Descending Coronary Artery: Mild atherosclerotic plaque in the proximal LAD (25-49% stenosis). Moderate long segment atherosclerotic plaque in the mid LAD just beyond the second, medium caliber diagonal branch (50-69% stenosis). The distal LAD is small caliber with a possible severe tubular atherosclerotic plaque with 70-99% stenosis. Ramus Intermedius: Small caliber ramus intermedius branch appears patent. Left Circumflex Artery: Mild tubular atherosclerotic plaque in the proximal circumflex (25-49% stenosis). Moderate plaque in the proximal circumflex (50-69% stenosis), just before the takeoff of the first OM. Possible moderate stenosis at the ostium of the OM1, 50-69% stenosis. Distal circumflex is small and tortuous. Aorta:  Normal size.  No calcifications.  No dissection. Aortic Valve: No calcifications. Other findings: Normal pulmonary vein drainage into the left atrium. Normal left atrial appendage without a thrombus. Normal size of the pulmonary  artery. Likely patent foramen ovale with small left to right shunt. IMPRESSION: 1. The patient's coronary artery calcium score is 4, which places the patient in the 51 percentile. This is average for what is expected compared to age and gender matched peers. 2. Normal coronary origin with right dominance. 3. Severe CAD in the distal LAD, CADRADS = 4. Moderate CAD in the proximal RCA, mid LAD, and proximal left circumflex artery. CT FFR analysis will be performed. Electronically Signed   By: Cherlynn Kaiser   On: 11/07/2018 09:02   Result Date: 11/07/2018 EXAM: OVER-READ INTERPRETATION  CT CHEST The following report is an over-read performed by radiologist Dr. Rolm Baptise of [facility] on 11/06/2018. This over-read does not include interpretation of cardiac or coronary anatomy or pathology. The coronary CTA interpretation by the cardiologist is attached. COMPARISON:  None. FINDINGS: Vascular: Heart is upper limits normal in size. Aorta is normal caliber. Marked irregular plaque noted in the mid to distal descending thoracic aorta, best  seen on sagittal reconstructed images. Mediastinum/Nodes: No adenopathy in the lower mediastinum or hila. Lungs/Pleura: Visualized lungs clear.  No effusions. Upper Abdomen: Imaging into the upper abdomen shows no acute findings. Musculoskeletal: Chest wall soft tissues are unremarkable. No acute bony abnormality. IMPRESSION: Irregular plaque formation in along the posterior wall of the mid and distal descending thoracic aorta. No acute extra cardiac abnormality. Electronically Signed: By: Rolm Baptise M.D. On: 11/06/2018 13:29   Ct Coronary Fractional Flow Reserve Data Prep  Result Date: 11/07/2018 EXAM: CT FFR ANALYSIS CLINICAL DATA:  chest pain, HLD FINDINGS: FFRct analysis was performed on the original cardiac CT angiogram dataset. Diagrammatic representation of the FFRct analysis is provided in a separate PDF document in PACS. This dictation was created  using the PDF document and an interactive 3D model of the results. 3D model is not available in the EMR/PACS. Normal FFR range is >0.80. 1. Left Main:  No significant stenosis. FFR = 0.98 2. LAD: No significant stenosis. Proximal FFR = 0.94, Mid FFR = 0.83, Distal FFR = 0.57 3. LCX: No significant stenosis. Proximal FFR = 0.87, Distal FFR = 0.81, OM1 = 0.83, OM2 = 0.82 4. RCA: No significant stenosis. Proximal FFR = 0.93, Mid FFR = 0.87 , Distal FFR = 0.85 IMPRESSION: 1. CT FFR analysis showed severe stenosis in the distal LAD. No other hemodynamically significant stenoses detected. Electronically Signed   By: Cherlynn Kaiser   On: 11/07/2018 09:06     Additional studies/ records that were reviewed today include:  I reviewed the records from the emergency room.  I reviewed the records from [facility] by Arville Go.   ------------------------------------------------------------------- 10/14/2018 ECHO Study Conclusions  - Left ventricle: Average global longitudinal LV strain is normal   at -18.7% The cavity size was normal. Systolic function was   normal. The estimated ejection fraction was in the range of 60%   to 65%. Wall motion was normal; there were no regional wall   motion abnormalities. The study is not technically sufficient to   allow evaluation of LV diastolic function. - Aortic valve: Trileaflet; mildly thickened, mildly calcified   leaflets. - Mitral valve: There was mild regurgitation. - Atrial septum: There was increased thickness of the septum,   consistent with lipomatous hypertrophy.   ASSESSMENT:    1. Abnormal cardiac CT angiography   2. Costochondritis   3. Possible Familial hyperlipidemia, high LDL at 224   4. Anxiety and depression   5. Attention deficit hyperactivity disorder (ADHD), unspecified ADHD type      PLAN:  Robin Arellano is a 68 year old female who has experienced intermittent episodes of somewhat atypical  chest discomfort.  Her chest pain has been occurring off and on for the past year typically is nonexertional.  Her ECG shows normal sinus rhythm.  She has small nondiagnostic inferior Q waves.  I do not believe she has had a prior myocardial infarction.  She has stage II hypertension and her blood pressure was significantly elevated at her emergency room evaluation.  When I initially saw her I recommended the addition of amlodipine to her medical regimen.  I reviewed her echo Doppler study with her in detail which reveals normal systolic without wall motion abnormalities and with normal global longitudinal strain.  There was evidence for mild aortic sclerosis without stenosis, mild MR, and probable lipomatous hypertrophy of her atrial septum.  She has continued to experience sharp stabbing-like chest pain and on physical examination today  she has definite tenderness to her costochondral region pectoral region suggesting her chest pain is most likely of musculoskeletal etiology.  I had a thorough discussion with her regarding her coronary CT angiogram which has demonstrated at least moderate multivessel coronary atherosclerosis.  The distal LAD lesion was felt to be hemodynamically significant on FFR analysis.  Discussed further titration of medical therapy versus definitive cardiac catheterization.  After much discussion she prefers to undergo definitive cardiac catheterization.I have reviewed the risks, indications, and alternatives to cardiac catheterization, possible angioplasty, and stenting with the patient. Risks include but are not limited to bleeding, infection, vascular injury, stroke, myocardial infection, arrhythmia, kidney injury, radiation-related injury in the case of prolonged fluoroscopy use, emergency cardiac surgery, and death. The patient understands the risks of serious complication is 1-2 in 4540 with diagnostic cardiac cath and 1-2% or less with angioplasty/stenting.  Suspect that she also has  familial hyperlipidemia based on her prior laboratory with markedly elevated LDL level.  There is no evidence of any tendon xanthomas.  She has now been on Crestor 20 mg but admits that she has noticed some leg weakness and calf discomfort.  Exam she did not have muscle tenderness to palpation of her lower extremities.  I have suggested she reduce her Crestor to 10 mg or take every other day and I will add Zetia 10 mg.  I suspect she may be a candidate for Repatha for PCSK9 inhibition for optimal treatment.  Blood pressure has improved with the addition of amlodipine to her medical regimen.  She continues to be on metoprolol 25 mg twice a day and amlodipine for blood pressure.  She continues to be on alderral for ADHD.  I will schedule her for cardiac catheterization to be done at [facility] on November 25, 2017.    Medication Adjustments/Labs and Tests Ordered: Current medicines are reviewed at length with the patient today.  Concerns regarding medicines are outlined above.  Medication changes, Labs and Tests ordered today are listed in the Patient Instructions below. Patient Instructions  Medication Instructions:  Start Zetia 10 mg daily.  Increase Amlodipine 10 mg  Decrease Crestor to 10 mg, cut it in half for now. If you need a refill on your cardiac medications before your next appointment, please call your pharmacy.   Lab work: Fasting CMET, BMET, TSH, Lipid, CBC tomorrow. If you have labs (blood work) drawn today and your tests are completely normal, you will receive your results only by: Marland Kitchen MyChart Message (if you have MyChart) OR . A paper copy in the mail If you have any lab test that is abnormal or we need to change your treatment, we will call you to review the results.  Testing/Procedures: Your physician has requested that you have a cardiac catheterization. Cardiac catheterization is used to diagnose and/or treat various heart conditions. Doctors may recommend this procedure for a  number of different reasons. The most common reason is to evaluate chest pain. Chest pain can be a symptom of coronary artery disease (CAD), and cardiac catheterization can show whether plaque is narrowing or blocking your heart's arteries. This procedure is also used to evaluate the valves, as well as measure the blood flow and oxygen levels in different parts of your heart. For further information please visit [email or web address].[email or web address]. Please follow instruction sheet, as given.  Follow-Up: At [facility], you and your health needs are our priority.  As part of our continuing mission to provide you with exceptional heart care, we have created  designated Provider Care Teams.  These Care Teams include your primary Cardiologist (physician) and Advanced Practice Providers (APPs -  Physician Assistants and Nurse Practitioners) who all work together to provide you with the care you need, when you need it. . Follow up after Cath.   Any Other Special Instructions Will Be Listed Below (If Applicable).        [place] [address] Dept: [phone] Loc: [place]  11/20/2018  You are scheduled for a Cardiac Catheterization on Tuesday, January 14 with Dr. Shelva Majestic.  1. Please arrive at the [facility] (Main Entrance A) at [facility]: [address] [place], [place] [address] at 7:30 AM (This time is two hours before your procedure to ensure your preparation). Free valet parking service is available.   Special note: Every effort is made to have your procedure done on time. Please understand that emergencies sometimes delay scheduled procedures.  2. Diet: Do not eat solid foods after midnight.  The patient may have clear liquids until 5am upon the day of the procedure.  3. Labs: You will need to have blood drawn tomorrow  4. Medication instructions in  preparation for your procedure:   On the morning of your procedure, take your Aspirin and any morning medicines NOT listed above.  You may use sips of water.  5. Plan for one night stay--bring personal belongings. 6. Bring a current list of your medications and current insurance cards. 7. You MUST have a responsible person to drive you home. 8. Someone MUST be with you the first 24 hours after you arrive home or your discharge will be delayed. 9. Please wear clothes that are easy to get on and off and wear slip-on shoes.  Thank you for allowing [place] to care for you!   -- [facility]       Signed, Shelva Majestic, MD  11/21/2018 8:34 AM    [place] [address] [place], [place],   [address] Phone: [phone]

## 2018-11-21 ENCOUNTER — Encounter: Payer: Self-pay | Admitting: Cardiovascular Disease

## 2018-11-21 DIAGNOSIS — R931 Abnormal findings on diagnostic imaging of heart and coronary circulation: Secondary | ICD-10-CM | POA: Diagnosis not present

## 2018-11-22 LAB — COMPREHENSIVE METABOLIC PANEL
ALT: 11 IU/L (ref 0–32)
AST: 13 IU/L (ref 0–40)
Albumin/Globulin Ratio: 1.7 (ref 1.2–2.2)
Albumin: 4.3 g/dL (ref 3.6–4.8)
Alkaline Phosphatase: 83 IU/L (ref 39–117)
BUN/Creatinine Ratio: 20 (ref 12–28)
BUN: 18 mg/dL (ref 8–27)
Bilirubin Total: 0.3 mg/dL (ref 0.0–1.2)
CO2: 23 mmol/L (ref 20–29)
Calcium: 9.9 mg/dL (ref 8.7–10.3)
Chloride: 103 mmol/L (ref 96–106)
Creatinine, Ser: 0.89 mg/dL (ref 0.57–1.00)
GFR calc Af Amer: 78 mL/min/{1.73_m2} (ref >59)
GFR calc non Af Amer: 67 mL/min/{1.73_m2} (ref >59)
Globulin, Total: 2.6 g/dL (ref 1.5–4.5)
Glucose: 81 mg/dL (ref 65–99)
Potassium: 5.1 mmol/L (ref 3.5–5.2)
Sodium: 139 mmol/L (ref 134–144)
Total Protein: 6.9 g/dL (ref 6.0–8.5)

## 2018-11-22 LAB — LIPID PANEL
Chol/HDL Ratio: 2.5 ratio (ref 0.0–4.4)
Cholesterol, Total: 146 mg/dL (ref 100–199)
HDL: 59 mg/dL (ref >39)
LDL Calculated: 61 mg/dL (ref 0–99)
Triglycerides: 132 mg/dL (ref 0–149)
VLDL Cholesterol Cal: 26 mg/dL (ref 5–40)

## 2018-11-22 LAB — TSH: TSH: 1.44 u[IU]/mL (ref 0.450–4.500)

## 2018-11-22 LAB — CBC
Hematocrit: 34.9 % (ref 34.0–46.6)
Hemoglobin: 11.6 g/dL (ref 11.1–15.9)
MCH: 32 pg (ref 26.6–33.0)
MCHC: 33.2 g/dL (ref 31.5–35.7)
MCV: 96 fL (ref 79–97)
Platelets: 388 10*3/uL (ref 150–450)
RBC: 3.62 x10E6/uL — ABNORMAL LOW (ref 3.77–5.28)
RDW: 14 % (ref 11.7–15.4)
WBC: 10.6 10*3/uL (ref 3.4–10.8)

## 2018-11-24 ENCOUNTER — Ambulatory Visit: Payer: PPO | Admitting: Cardiovascular Disease

## 2018-11-24 ENCOUNTER — Telehealth: Payer: Self-pay | Admitting: *Deleted

## 2018-11-24 NOTE — Telephone Encounter (Signed)
Pt contacted pre-catheterization scheduled at [facility] for: Tuesday November 25, 2018 9 AM Verified arrival time and place: [facility] En[facility]ance A at: 7 AM  No solid food after midnight prior to cath, clear liquids until 5 AM day of procedure. Contrast allergy: no Verified no diabetes medications: no  AM meds can be  taken pre-cath with sip of water including: ASA 81 mg  Confirmed patient has responsible person to drive home post procedure and for 24 hours after you arrive home: yes  Pt states when she is lying on her back she has trouble breathing, she was advised by Dr Evette Georges office to notify Short Stay on arrival.

## 2018-11-25 ENCOUNTER — Encounter (HOSPITAL_COMMUNITY)
Admission: RE | Disposition: A | Discharge status: Home / Self Care | Payer: Self-pay | Source: Home / Self Care | Attending: Cardiovascular Disease

## 2018-11-25 ENCOUNTER — Ambulatory Visit (HOSPITAL_COMMUNITY)
Admission: RE | Admit: 2018-11-25 | Discharge: 2018-11-25 | Disposition: A | Discharge status: Home / Self Care | Payer: PPO | Attending: Cardiovascular Disease | Admitting: Cardiovascular Disease

## 2018-11-25 ENCOUNTER — Encounter (HOSPITAL_COMMUNITY): Payer: Self-pay | Admitting: Cardiovascular Disease

## 2018-11-25 DIAGNOSIS — Z87891 Personal history of nicotine dependence: Secondary | ICD-10-CM | POA: Insufficient documentation

## 2018-11-25 DIAGNOSIS — E559 Vitamin D deficiency, unspecified: Secondary | ICD-10-CM | POA: Diagnosis not present

## 2018-11-25 DIAGNOSIS — Z90710 Acquired absence of both cervix and uterus: Secondary | ICD-10-CM | POA: Insufficient documentation

## 2018-11-25 DIAGNOSIS — Z8249 Family history of ischemic heart disease and other diseases of the circulatory system: Secondary | ICD-10-CM | POA: Diagnosis not present

## 2018-11-25 DIAGNOSIS — Z90722 Acquired absence of ovaries, bilateral: Secondary | ICD-10-CM | POA: Insufficient documentation

## 2018-11-25 DIAGNOSIS — F419 Anxiety disorder, unspecified: Secondary | ICD-10-CM | POA: Insufficient documentation

## 2018-11-25 DIAGNOSIS — I25119 Atherosclerotic heart disease of native coronary artery with unspecified angina pectoris: Secondary | ICD-10-CM | POA: Insufficient documentation

## 2018-11-25 DIAGNOSIS — F909 Attention-deficit hyperactivity disorder, unspecified type: Secondary | ICD-10-CM | POA: Diagnosis not present

## 2018-11-25 DIAGNOSIS — I252 Old myocardial infarction: Secondary | ICD-10-CM | POA: Diagnosis not present

## 2018-11-25 DIAGNOSIS — R931 Abnormal findings on diagnostic imaging of heart and coronary circulation: Secondary | ICD-10-CM | POA: Diagnosis not present

## 2018-11-25 DIAGNOSIS — M940 Chondrocostal junction syndrome [Tietze]: Secondary | ICD-10-CM | POA: Insufficient documentation

## 2018-11-25 DIAGNOSIS — E785 Hyperlipidemia, unspecified: Secondary | ICD-10-CM | POA: Diagnosis not present

## 2018-11-25 DIAGNOSIS — Z91040 Latex allergy status: Secondary | ICD-10-CM | POA: Diagnosis not present

## 2018-11-25 DIAGNOSIS — Z7982 Long term (current) use of aspirin: Secondary | ICD-10-CM | POA: Insufficient documentation

## 2018-11-25 DIAGNOSIS — I10 Essential (primary) hypertension: Secondary | ICD-10-CM | POA: Diagnosis not present

## 2018-11-25 DIAGNOSIS — M1990 Unspecified osteoarthritis, unspecified site: Secondary | ICD-10-CM | POA: Insufficient documentation

## 2018-11-25 DIAGNOSIS — I2510 Atherosclerotic heart disease of native coronary artery without angina pectoris: Secondary | ICD-10-CM

## 2018-11-25 DIAGNOSIS — M797 Fibromyalgia: Secondary | ICD-10-CM | POA: Insufficient documentation

## 2018-11-25 DIAGNOSIS — F329 Major depressive disorder, single episode, unspecified: Secondary | ICD-10-CM | POA: Insufficient documentation

## 2018-11-25 DIAGNOSIS — I2584 Coronary atherosclerosis due to calcified coronary lesion: Secondary | ICD-10-CM

## 2018-11-25 DIAGNOSIS — Z79899 Other long term (current) drug therapy: Secondary | ICD-10-CM | POA: Diagnosis not present

## 2018-11-25 DIAGNOSIS — R079 Chest pain, unspecified: Secondary | ICD-10-CM

## 2018-11-25 DIAGNOSIS — Z888 Allergy status to other drugs, medicaments and biological substances status: Secondary | ICD-10-CM | POA: Diagnosis not present

## 2018-11-25 HISTORY — PX: LEFT HEART CATH AND CORONARY ANGIOGRAPHY: CATH118249

## 2018-11-25 SURGERY — LEFT HEART CATH AND CORONARY ANGIOGRAPHY
Anesthesia: LOCAL

## 2018-11-25 MED ORDER — VERAPAMIL HCL 2.5 MG/ML IV SOLN
INTRAVENOUS | Status: DC | PRN
  Administered 2018-11-25: 09:00:00 via INTRA_ARTERIAL

## 2018-11-25 MED ORDER — SODIUM CHLORIDE 0.9 % WEIGHT BASED INFUSION: 1.0000 mL/kg/h | INTRAVENOUS | Status: DC

## 2018-11-25 MED ORDER — SODIUM CHLORIDE 0.9% FLUSH: 3.0000 mL | Freq: Two times a day (BID) | INTRAVENOUS | Status: DC

## 2018-11-25 MED ORDER — ACETAMINOPHEN 325 MG PO TABS: 650.0000 mg | ORAL_TABLET | ORAL | Status: DC | PRN

## 2018-11-25 MED ORDER — HEPARIN (PORCINE) IN NACL 1000-0.9 UT/500ML-% IV SOLN
INTRAVENOUS | Status: DC | PRN
  Administered 2018-11-25 (×2): 500 mL

## 2018-11-25 MED ORDER — ASPIRIN 81 MG PO CHEW: 81.0000 mg | CHEWABLE_TABLET | ORAL | Status: DC

## 2018-11-25 MED ORDER — LIDOCAINE HCL (PF) 1 % IJ SOLN
INTRAMUSCULAR | Status: DC | PRN
  Administered 2018-11-25: 2 mL

## 2018-11-25 MED ORDER — LIDOCAINE HCL (PF) 1 % IJ SOLN
INTRAMUSCULAR | Status: AC
  Filled 2018-11-25: qty 30

## 2018-11-25 MED ORDER — MIDAZOLAM HCL 2 MG/2ML IJ SOLN
INTRAMUSCULAR | Status: DC | PRN
  Administered 2018-11-25: 2 mg via INTRAVENOUS
  Administered 2018-11-25: 1 mg via INTRAVENOUS

## 2018-11-25 MED ORDER — SODIUM CHLORIDE 0.9 % WEIGHT BASED INFUSION
3.0000 mL/kg/h | INTRAVENOUS | Status: AC
  Administered 2018-11-25: 3 mL/kg/h via INTRAVENOUS

## 2018-11-25 MED ORDER — SODIUM CHLORIDE 0.9 % IV SOLN: INTRAVENOUS | Status: DC

## 2018-11-25 MED ORDER — HEPARIN (PORCINE) IN NACL 1000-0.9 UT/500ML-% IV SOLN
INTRAVENOUS | Status: AC
  Filled 2018-11-25: qty 1000

## 2018-11-25 MED ORDER — MIDAZOLAM HCL 2 MG/2ML IJ SOLN
INTRAMUSCULAR | Status: AC
  Filled 2018-11-25: qty 2

## 2018-11-25 MED ORDER — FENTANYL CITRATE (PF) 100 MCG/2ML IJ SOLN
INTRAMUSCULAR | Status: DC | PRN
  Administered 2018-11-25 (×2): 25 ug via INTRAVENOUS

## 2018-11-25 MED ORDER — SODIUM CHLORIDE 0.9 % IV SOLN: 250.0000 mL | INTRAVENOUS | Status: DC | PRN

## 2018-11-25 MED ORDER — ONDANSETRON HCL 4 MG/2ML IJ SOLN: 4.0000 mg | Freq: Four times a day (QID) | INTRAMUSCULAR | Status: DC | PRN

## 2018-11-25 MED ORDER — ASPIRIN 81 MG PO CHEW: 81.0000 mg | CHEWABLE_TABLET | Freq: Every day | ORAL | Status: DC

## 2018-11-25 MED ORDER — SODIUM CHLORIDE 0.9% FLUSH: 3.0000 mL | INTRAVENOUS | Status: DC | PRN

## 2018-11-25 MED ORDER — FENTANYL CITRATE (PF) 100 MCG/2ML IJ SOLN
INTRAMUSCULAR | Status: AC
  Filled 2018-11-25: qty 2

## 2018-11-25 MED ORDER — VERAPAMIL HCL 2.5 MG/ML IV SOLN
INTRAVENOUS | Status: AC
  Filled 2018-11-25: qty 2

## 2018-11-25 MED ORDER — HEPARIN SODIUM (PORCINE) 1000 UNIT/ML IJ SOLN
INTRAMUSCULAR | Status: DC | PRN
  Administered 2018-11-25: 4000 [IU] via INTRAVENOUS

## 2018-11-25 MED ORDER — IOHEXOL 350 MG/ML SOLN
INTRAVENOUS | Status: DC | PRN
  Administered 2018-11-25: 55 mL via INTRACARDIAC

## 2018-11-25 SURGICAL SUPPLY — 10 items
CATH INFINITI JR4 5F (CATHETERS) ×1 IMPLANT
CATH OPTITORQUE TIG 4.0 5F (CATHETERS) ×1 IMPLANT
DEVICE RAD COMP TR BAND LRG (VASCULAR PRODUCTS) ×1 IMPLANT
GLIDESHEATH SLEND SS 6F .021 (SHEATH) ×1 IMPLANT
GUIDEWIRE INQWIRE 1.5J.035X260 (WIRE) IMPLANT
INQWIRE 1.5J .035X260CM (WIRE) ×2
KIT HEART LEFT (KITS) ×2 IMPLANT
PACK CARDIAC CATHETERIZATION (CUSTOM PROCEDURE TRAY) ×2 IMPLANT
TRANSDUCER W/STOPCOCK (MISCELLANEOUS) ×2 IMPLANT
TUBING CIL FLEX 10 FLL-RA (TUBING) ×2 IMPLANT

## 2018-11-25 NOTE — Interval H&P Note (Signed)
Cath Lab Visit (complete for each Cath Lab visit)  Clinical Evaluation Leading to the Procedure:   ACS: No.  Non-ACS:    Anginal Classification: CCS II  Anti-ischemic medical therapy: Maximal Therapy (2 or more classes of medications)  Non-Invasive Test Results: Intermediate-risk stress test findings: cardiac mortality 1-3%/year  Prior CABG: No previous CABG      History and Physical Interval Note:  11/25/2018 9:11 AM  Robin Arellano  has presented today for surgery, with the diagnosis of Abnorm. CT  The various methods of treatment have been discussed with the patient and family. After consideration of risks, benefits and other options for treatment, the patient has consented to  Procedure(s): LEFT HEART CATH AND CORONARY ANGIOGRAPHY (N/A) as a surgical intervention .  The patient's history has been reviewed, patient examined, no change in status, stable for surgery.  I have reviewed the patient's chart and labs.  Questions were answered to the patient's satisfaction.     Shelva Majestic

## 2018-11-25 NOTE — Discharge Instructions (Signed)
Radial Site Care ° °This sheet gives you information about how to care for yourself after your procedure. Your health care provider may also give you more specific instructions. If you have problems or questions, contact your health care provider. °What can I expect after the procedure? °After the procedure, it is common to have: °· Bruising and tenderness at the catheter insertion area. °Follow these instructions at home: °Medicines °· Take over-the-counter and prescription medicines only as told by your health care provider. °Insertion site care °· Follow instructions from your health care provider about how to take care of your insertion site. Make sure you: °? Wash your hands with soap and water before you change your bandage (dressing). If soap and water are not available, use hand sanitizer. °? Change your dressing as told by your health care provider. °? Leave stitches (sutures), skin glue, or adhesive strips in place. These skin closures may need to stay in place for 2 weeks or longer. If adhesive strip edges start to loosen and curl up, you may trim the loose edges. Do not remove adhesive strips completely unless your health care provider tells you to do that. °· Check your insertion site every day for signs of infection. Check for: °? Redness, swelling, or pain. °? Fluid or blood. °? Pus or a bad smell. °? Warmth. °· Do not take baths, swim, or use a hot tub until your health care provider approves. °· You may shower 24-48 hours after the procedure, or as directed by your health care provider. °? Remove the dressing and gently wash the site with plain soap and water. °? Pat the area dry with a clean towel. °? Do not rub the site. That could cause bleeding. °· Do not apply powder or lotion to the site. °Activity ° °· For 24 hours after the procedure, or as directed by your health care provider: °? Do not flex or bend the affected arm. °? Do not push or pull heavy objects with the affected arm. °? Do not  drive yourself home from the hospital or clinic. You may drive 24 hours after the procedure unless your health care provider tells you not to. °? Do not operate machinery or power tools. °· Do not lift anything that is heavier than 10 lb (4.5 kg), or the limit that you are told, until your health care provider says that it is safe. °· Ask your health care provider when it is okay to: °? Return to work or school. °? Resume usual physical activities or sports. °? Resume sexual activity. °General instructions °· If the catheter site starts to bleed, raise your arm and put firm pressure on the site. If the bleeding does not stop, get help right away. This is a medical emergency. °· If you went home on the same day as your procedure, a responsible adult should be with you for the first 24 hours after you arrive home. °· Keep all follow-up visits as told by your health care provider. This is important. °Contact a health care provider if: °· You have a fever. °· You have redness, swelling, or yellow drainage around your insertion site. °Get help right away if: °· You have unusual pain at the radial site. °· The catheter insertion area swells very fast. °· The insertion area is bleeding, and the bleeding does not stop when you hold steady pressure on the area. °· Your arm or hand becomes pale, cool, tingly, or numb. °These symptoms may represent a serious problem   that is an emergency. Do not wait to see if the symptoms will go away. Get medical help right away. Call your local emergency services (911 in the [place] Do not drive yourself to the hospital. °Summary °· After the procedure, it is common to have bruising and tenderness at the site. °· Follow instructions from your health care provider about how to take care of your radial site wound. Check the wound every day for signs of infection. °· Do not lift anything that is heavier than 10 lb (4.5 kg), or the limit that you are told, until your health care provider says  that it is safe. °This information is not intended to replace advice given to you by your health care provider. Make sure you discuss any questions you have with your health care provider. °Document Released: 12/01/2010 Document Revised: 12/04/2017 Document Reviewed: 12/04/2017 °Elsevier Interactive Patient Education © 2019 Elsevier Inc. ° °

## 2018-11-25 NOTE — Progress Notes (Signed)
Up to bathroom to void. Tolerated  Well.

## 2018-11-25 NOTE — Interval H&P Note (Signed)
History and Physical Interval Note:  11/25/2018 9:11 AM  Robin Arellano  has presented today for surgery, with the diagnosis of Abnorm. CT  The various methods of treatment have been discussed with the patient and family. After consideration of risks, benefits and other options for treatment, the patient has consented to  Procedure(s): LEFT HEART CATH AND CORONARY ANGIOGRAPHY (N/A) as a surgical intervention .  The patient's history has been reviewed, patient examined, no change in status, stable for surgery.  I have reviewed the patient's chart and labs.  Questions were answered to the patient's satisfaction.     Shelva Majestic

## 2018-12-03 ENCOUNTER — Encounter: Payer: Self-pay | Admitting: Neurology

## 2018-12-03 ENCOUNTER — Ambulatory Visit: Payer: PPO | Admitting: Neurology

## 2018-12-03 ENCOUNTER — Encounter

## 2018-12-03 VITALS — BP 114/64 | HR 48 | Ht 62.5 in | Wt 175.0 lb

## 2018-12-03 DIAGNOSIS — M7918 Myalgia, other site: Secondary | ICD-10-CM | POA: Diagnosis not present

## 2018-12-03 DIAGNOSIS — G5603 Carpal tunnel syndrome, bilateral upper limbs: Secondary | ICD-10-CM

## 2018-12-03 NOTE — Patient Instructions (Addendum)
[place] at 9:15 am Monday EMG/NCS her ein our office   Carpal Tunnel Syndrome  Carpal tunnel syndrome is a condition that causes pain in your hand and arm. The carpal tunnel is a narrow area that is on the palm side of your wrist. Repeated wrist motion or certain diseases may cause swelling in the tunnel. This swelling can pinch the main nerve in the wrist (median nerve). What are the causes? This condition may be caused by:  Repeated wrist motions.  Wrist injuries.  Arthritis.  A sac of fluid (cyst) or abnormal growth (tumor) in the carpal tunnel.  Fluid buildup during pregnancy. Sometimes the cause is not known. What increases the risk? The following factors may make you more likely to develop this condition:  Having a job in which you move your wrist in the same way many times. This includes jobs like being a Software engineer or a Scientist, water quality.  Being a woman.  Having other health conditions, such as: ? Diabetes. ? Obesity. ? A thyroid gland that is not active enough (hypothyroidism). ? Kidney failure. What are the signs or symptoms? Symptoms of this condition include:  A tingling feeling in your fingers.  Tingling or a loss of feeling (numbness) in your hand.  Pain in your entire arm. This pain may get worse when you bend your wrist and elbow for a long time.  Pain in your wrist that goes up your arm to your shoulder.  Pain that goes down into your palm or fingers.  A weak feeling in your hands. You may find it hard to grab and hold items. You may feel worse at night. How is this diagnosed? This condition is diagnosed with a medical history and physical exam. You may also have tests, such as:  Electromyogram (EMG). This test checks the signals that the nerves send to the muscles.  Nerve conduction study. This test checks how well signals pass through your nerves.  Imaging tests, such as X-rays, ultrasound, and MRI. These tests check for what might be the cause of your  condition. How is this treated? This condition may be treated with:  Lifestyle changes. You will be asked to stop or change the activity that caused your problem.  Doing exercise and activities that make bones and muscles stronger (physical therapy).  Learning how to use your hand again (occupational therapy).  Medicines for pain and swelling (inflammation). You may have injections in your wrist.  A wrist splint.  Surgery. Follow these instructions at home: If you have a splint:  Wear the splint as told by your doctor. Remove it only as told by your doctor.  Loosen the splint if your fingers: ? Tingle. ? Lose feeling (become numb). ? Turn cold and blue.  Keep the splint clean.  If the splint is not waterproof: ? Do not let it get wet. ? Cover it with a watertight covering when you take a bath or a shower. Managing pain, stiffness, and swelling   If told, put ice on the painful area: ? If you have a removable splint, remove it as told by your doctor. ? Put ice in a plastic bag. ? Place a towel between your skin and the bag. ? Leave the ice on for 20 minutes, 2-3 times per day. General instructions  Take over-the-counter and prescription medicines only as told by your doctor.  Rest your wrist from any activity that may cause pain. If needed, talk with your boss at work about changes that can help  your wrist heal.  Do any exercises as told by your doctor, physical therapist, or occupational therapist.  Keep all follow-up visits as told by your doctor. This is important. Contact a doctor if:  You have new symptoms.  Medicine does not help your pain.  Your symptoms get worse. Get help right away if:  You have very bad numbness or tingling in your wrist or hand. Summary  Carpal tunnel syndrome is a condition that causes pain in your hand and arm.  It is often caused by repeated wrist motions.  Lifestyle changes and medicines are used to treat this problem.  Surgery may help in very bad cases.  Follow your doctor's instructions about wearing a splint, resting your wrist, keeping follow-up visits, and calling for help. This information is not intended to replace advice given to you by your health care provider. Make sure you discuss any questions you have with your health care provider. Document Released: 10/18/2011 Document Revised: 03/07/2018 Document Reviewed: 03/07/2018 Elsevier Interactive Patient Education  2019 [place].   Myofascial Pain Syndrome and Fibromyalgia Myofascial pain syndrome and fibromyalgia are both pain disorders. This pain may be felt mainly in your muscles.  Myofascial pain syndrome: ? Always has tender points in the muscle that will cause pain when pressed (trigger points). The pain may come and go. ? Usually affects your neck, upper back, and shoulder areas. The pain often radiates into your arms and hands.  Fibromyalgia: ? Has muscle pains and tenderness that come and go. ? Is often associated with fatigue and sleep problems. ? Has trigger points. ? Tends to be long-lasting (chronic), but is not life-threatening. Fibromyalgia and myofascial pain syndrome are not the same. However, they often occur together. If you have both conditions, each can make the other worse. Both are common and can cause enough pain and fatigue to make day-to-day activities difficult. Both can be hard to diagnose because their symptoms are common in many other conditions. What are the causes? The exact causes of these conditions are not known. What increases the risk? You are more likely to develop this condition if:  You have a family history of the condition.  You have certain triggers, such as: ? Spine disorders. ? An injury (trauma) or other physical stressors. ? Being under a lot of stress. ? Medical conditions such as osteoarthritis, rheumatoid arthritis, or lupus. What are the signs or symptoms? Fibromyalgia The main  symptom of fibromyalgia is widespread pain and tenderness in your muscles. Pain is sometimes described as stabbing, shooting, or burning. You may also have:  Tingling or numbness.  Sleep problems and fatigue.  Problems with attention and concentration (fibro fog). Other symptoms may include:  Bowel and bladder problems.  Headaches.  Visual problems.  Problems with odors and noises.  Depression or mood changes.  Painful menstrual periods (dysmenorrhea).  Dry skin or eyes. These symptoms can vary over time. Myofascial pain syndrome Symptoms of myofascial pain syndrome include:  Tight, ropy bands of muscle.  Uncomfortable sensations in muscle areas. These may include aching, cramping, burning, numbness, tingling, and weakness.  Difficulty moving certain parts of the body freely (poor range of motion). How is this diagnosed? This condition may be diagnosed by your symptoms and medical history. You will also have a physical exam. In general:  Fibromyalgia is diagnosed if you have pain, fatigue, and other symptoms for more than 3 months, and symptoms cannot be explained by another condition.  Myofascial pain syndrome is diagnosed if  you have trigger points in your muscles, and those trigger points are tender and cause pain elsewhere in your body (referred pain). How is this treated? Treatment for these conditions depends on the type that you have.  For fibromyalgia: ? Pain medicines, such as NSAIDs. ? Medicines for treating depression. ? Medicines for treating seizures. ? Medicines that relax the muscles.  For myofascial pain: ? Pain medicines, such as NSAIDs. ? Cooling and stretching of muscles. ? Trigger point injections. ? Sound wave (ultrasound) treatments to stimulate muscles. Treating these conditions often requires a team of health care providers. These may include:  Your primary care provider.  Physical therapist.  Complementary health care providers, such  as massage therapists or acupuncturists.  Psychiatrist for cognitive behavioral therapy. Follow these instructions at home: Medicines  Take over-the-counter and prescription medicines only as told by your health care provider.  Do not drive or use heavy machinery while taking prescription pain medicine.  If you are taking prescription pain medicine, take actions to prevent or treat constipation. Your health care provider may recommend that you: ? Drink enough fluid to keep your urine pale yellow. ? Eat foods that are high in fiber, such as fresh fruits and vegetables, whole grains, and beans. ? Limit foods that are high in fat and processed sugars, such as fried or sweet foods. ? Take an over-the-counter or prescription medicine for constipation. Lifestyle   Exercise as directed by your health care provider or physical therapist.  Practice relaxation techniques to control your stress. You may want to try: ? Biofeedback. ? Visual imagery. ? Hypnosis. ? Muscle relaxation. ? Yoga. ? Meditation.  Maintain a healthy lifestyle. This includes eating a healthy diet and getting enough sleep.  Do not use any products that contain nicotine or tobacco, such as cigarettes and e-cigarettes. If you need help quitting, ask your health care provider. General instructions  Talk to your health care provider about complementary treatments, such as acupuncture or massage.  Consider joining a support group with others who are diagnosed with this condition.  Do not do activities that stress or strain your muscles. This includes repetitive motions and heavy lifting.  Keep all follow-up visits as told by your health care provider. This is important. Where to find more information  National Fibromyalgia Association: www.fmaware.[place]: www.arthritis.org  American Chronic Pain Association: www.theacpa.org Contact a health care provider if:  You have new symptoms.  Your  symptoms get worse or your pain is severe.  You have side effects from your medicines.  You have trouble sleeping.  Your condition is causing depression or anxiety. Summary  Myofascial pain syndrome and fibromyalgia are pain disorders.  Myofascial pain syndrome has tender points in the muscle that will cause pain when pressed (trigger points). Fibromyalgia also has muscle pains and tenderness that come and go, but this condition is often associated with fatigue and sleep disturbances.  Fibromyalgia and myofascial pain syndrome are not the same but often occur together, causing pain and fatigue that make day-to-day activities difficult.  Treatment for fibromyalgia includes taking medicines to relax the muscles and medicines for pain, depression, or seizures. Treatment for myofascial pain syndrome includes taking medicines for pain, cooling and stretching of muscles, and injecting medicines into trigger points.  Follow your health care provider's instructions for taking medicines and maintaining a healthy lifestyle. This information is not intended to replace advice given to you by your health care provider. Make sure you discuss any questions you have  with your health care provider. Document Released: 10/29/2005 Document Revised: 11/13/2017 Document Reviewed: 11/13/2017 Elsevier Interactive Patient Education  2019 [employer].
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
## 2018-12-08 ENCOUNTER — Ambulatory Visit: Payer: PPO

## 2018-12-08 DIAGNOSIS — M4317 Spondylolisthesis, lumbosacral region: Secondary | ICD-10-CM | POA: Diagnosis not present

## 2018-12-08 DIAGNOSIS — I10 Essential (primary) hypertension: Secondary | ICD-10-CM | POA: Diagnosis not present

## 2018-12-08 DIAGNOSIS — Z6829 Body mass index (BMI) 29.0-29.9, adult: Secondary | ICD-10-CM | POA: Diagnosis not present

## 2018-12-08 DIAGNOSIS — M542 Cervicalgia: Secondary | ICD-10-CM | POA: Diagnosis not present

## 2018-12-09 ENCOUNTER — Other Ambulatory Visit: Payer: Self-pay | Admitting: Neurosurgery

## 2018-12-09 DIAGNOSIS — M542 Cervicalgia: Secondary | ICD-10-CM

## 2018-12-14 ENCOUNTER — Ambulatory Visit
Admission: RE | Admit: 2018-12-14 | Discharge: 2018-12-14 | Disposition: A | Discharge status: Home / Self Care | Payer: PPO | Source: Ambulatory Visit | Attending: Neurosurgery | Admitting: Neurosurgery

## 2018-12-14 DIAGNOSIS — M5030 Other cervical disc degeneration, unspecified cervical region: Secondary | ICD-10-CM | POA: Diagnosis not present

## 2018-12-14 DIAGNOSIS — M542 Cervicalgia: Secondary | ICD-10-CM

## 2019-01-01 DIAGNOSIS — M4317 Spondylolisthesis, lumbosacral region: Secondary | ICD-10-CM | POA: Diagnosis not present

## 2019-01-01 DIAGNOSIS — M545 Low back pain: Secondary | ICD-10-CM | POA: Diagnosis not present

## 2019-01-14 ENCOUNTER — Other Ambulatory Visit: Payer: Self-pay | Admitting: Women's Health

## 2019-01-14 ENCOUNTER — Encounter: Payer: Self-pay | Admitting: Cardiovascular Disease

## 2019-01-14 ENCOUNTER — Ambulatory Visit: Payer: PPO | Admitting: Cardiovascular Disease

## 2019-01-14 VITALS — BP 122/64 | HR 59 | Ht 62.5 in | Wt 174.2 lb

## 2019-01-14 DIAGNOSIS — M5090 Cervical disc disorder, unspecified, unspecified cervical region: Secondary | ICD-10-CM

## 2019-01-14 DIAGNOSIS — I10 Essential (primary) hypertension: Secondary | ICD-10-CM

## 2019-01-14 DIAGNOSIS — E785 Hyperlipidemia, unspecified: Secondary | ICD-10-CM

## 2019-01-14 DIAGNOSIS — R931 Abnormal findings on diagnostic imaging of heart and coronary circulation: Secondary | ICD-10-CM | POA: Diagnosis not present

## 2019-01-14 DIAGNOSIS — I2510 Atherosclerotic heart disease of native coronary artery without angina pectoris: Secondary | ICD-10-CM

## 2019-01-14 MED ORDER — ROSUVASTATIN CALCIUM 10 MG PO TABS: 5.0000 mg | ORAL_TABLET | Freq: Every day | ORAL | 1 refills | Status: DC

## 2019-01-14 NOTE — Patient Instructions (Signed)

## 2019-01-14 NOTE — Progress Notes (Signed)
Cardiology Office Note    Date:  01/16/2019   ID:  Lonna Kerith, Sherley May 31, 1951, MRN [id number]  PCP:  Sandi Mariscal, MD  Cardiologist:  Shelva Majestic, MD   Chief Complaint  Patient presents with     F/U cardiology evaluation initally referred by Dr.Yun Sun at [facility] [place]  History of Present Illness:  Emmilia Prescott is a 68 y.o. female who was referred to me by Dr. Nancy Fetter and was seen by me on October 06, 2018.  She presents for follow-up evaluation following her recent cardiac catheterization.  I had seen remotely Ms Guilford over 25 years ago in early 1994 with atypical chest pain. She was recently evaluated in September 22, 2018 by Dr. Aletta Edouard at [facility] with complaints of intermittent chest pain has been occurring off and on over the past year.  Prior to that evaluation, she was evaluated by her PCP and was told that she may have had an old heart attack based on her ECG.  During her ER evaluation she was significantly hypertensive with a blood pressure 187/100.  A chest x-ray did not show any acute abnormalities.  She had experienced some transient left facial numbness for 5 days associated with some dizziness and a CT was done which revealed atrophy with small vessel chronic ischemic changes of deep cerebral white matter without acute intracranial abnormalities.  She has recently been evaluated at [facility] [place] by Dr. Theressa Millard and she is felt to have prediabetes.  Lipid studies were increased with a total cholesterol of 319, triglycerides 209, LDL cholesterol 224, and she was told to initiate Crestor 10 mg which she has not yet started.  Her sedimentation rate was elevated at 64.  ANA was negative.  RF was normal.  Thyroid studies were normal.  Because of her recent symptomatology, she was referred for cardiology evaluation.  When I saw her in November 2019 she complained of some intermittent chest discomfort which occurs in the center of her chest and  seems to radiate to her left shoulder. It is aggravated when she turns a certain way and may last for hours and then ultimately resolves on its own.  She was told that this may be from "Iinside shingles."   She denied any exertional symptomatology.  Additional history is notable for chronic bilateral low back pain with bilateral sciatica.  She also  has fibromyalgia and osteoarthritis.  Has a history of anxiety and depression.  During my evaluation, I reviewed laboratory from her primary physician which suggested a high likelihood for familial hyperlipidemia with a total cholesterol at 319, LDL cholesterol at 224, triglycerides of 209, and HDL of 53.  With her recurrent chest pain which most likely had musculoskeletal features and her significant hyperlipidemia I recommended she undergo coronary CT a to evaluate potential coronary atherosclerosis and recommended an echo Doppler evaluation particularly with her hypertensive history.  The echo Doppler study was done on October 14, 2018 and showed a normal LV function and normal LV strain with an EF of 60 to 65%.  There was mild aortic valve sclerosis without stenosis, mild MR, and a mildly thickened atrial septum consistent with lipomatous hypertrophy.  She underwent coronary CTA on November 06, 2018 which showed moderate coronary plaque with moderate tubular atherosclerosis in the proximal RCA and mid RCA in the 50 to 69% range.  Left main coronary artery had mild plaque less than 25%.  Her LAD had mild proximal plaque in the 25  to 49% range, also had a moderate long segment of atherosclerotic plaque in the mid LAD just beyond the second diagonal vessel of 50 to 69%.  She was felt to have possible severe stenosis in the distal LAD with 70 to 99% plaque.  She had a small caliber ramus intermediate vessel.  The circumflex had mild to moderate plaque of 25 to 49% proximally, and then 50 to 69% stenosis.  Subsequent FFR analysis not reveal any significant  flow-limiting stenosis with the exception of the distal LAD where the FFR was 0.57.   When I last saw her November 20, 2018 she denied any classic exertional chest pain.  Her chest pain has been recurring and is described as being sharp and knifelike.  Typically it is of short duration.  She also has recently started rosuvastatin 20 mg and has begun to notice some calf discomfort attributed to initiation of statin therapy.    With her abnormal coronary CT angiogram definitive cardiac catheterization was recommended.  She underwent this catheterization by me on November 25, 2018 which revealed mild multivessel nonobstructive CAD with narrowings no greater than 25% in the LAD, circumflex marginal, and dominant RCA.  Medical therapy was recommended with aggressive lipid-lowering therapy with target LDL less than 70.  It was felt that the patient's chest pain had atypical features and most likely was nonischemic.  As her catheterization she ultimately underwent neurologic evaluation and subsequent MRI of her cervical spine which showed mild multilevel degenerative spondylolisthesis in the cervical spine with associated widespread cervical facet arthropathy.  There was superimposed cervical disc and endplate degeneration maximal at C5 and 6 but no associated cervical spinal stenosis.  There was moderate neuroforaminal stenosis at the right C4 right C5 bilateral C6 and left C7 nerve levels.  He has been seeing Dr. Maryjean Ka for pain management and had undergone epidural injection.  She also sees Dr. Vertell Limber.  She denies any exertional chest pain.  She has continued to take rosuvastatin for hyperlipidemia.  Resents for reevaluation  Past Medical History:  Diagnosis Date  . ADHD   . Anxiety   . Back pain   . Chronic female pelvic pain   . Depression   . Fibromyalgia   . H/O leukocytosis   . Headache   . MI (myocardial infarction) ([place])    Pt states she did not have a MI- EKG was normal, was GERD  .  Osteoarthritis   . Ovarian cyst, right   . Post-operative nausea and vomiting   . SVD (spontaneous vaginal delivery)    x 2  . Vitamin D deficiency     Past Surgical History:  Procedure Laterality Date  . ABDOMINAL HYSTERECTOMY  1994   TAH.[place]  . COLONOSCOPY  08/12/2017   Hx polyp/[place]  . KNEE SURGERY Bilateral 1996   x 2 - arthroscopic  . LEFT HEART CATH AND CORONARY ANGIOGRAPHY N/A 11/25/2018   Procedure: LEFT HEART CATH AND CORONARY ANGIOGRAPHY;  Surgeon: Troy Sine, MD;  Location: [facility];  Service: Cardiovascular;  Laterality: N/A;  . PELVIC LAPAROSCOPY  1989   W LYSIS OF ADHESIONS/L SALPINGONEOSTOMY  . WISDOM TOOTH EXTRACTION      Current Medications: Outpatient Medications Prior to Visit  Medication Sig Dispense Refill  . ALPRAZolam (XANAX) 0.5 MG tablet Take 0.5 mg by mouth at bedtime.     Marland Kitchen amLODipine (NORVASC) 10 MG tablet Take 1 tablet (10 mg total) by mouth daily. 90 tablet 3  .  Ascorbic Acid (VITAMIN C) 1000 MG tablet Take 1,000 mg by mouth daily.    Marland Kitchen aspirin EC 81 MG tablet Take 81 mg by mouth daily.    . butalbital-acetaminophen-caffeine (FIORICET, ESGIC) 50-325-40 MG tablet Take 1 tablet by mouth every 6 (six) hours as needed for headache.   0  . cholecalciferol (VITAMIN D) 1000 units tablet Take 1,000 Units by mouth daily.     . citalopram (CELEXA) 20 MG tablet Take 20 mg by mouth at bedtime.     Marland Kitchen estradiol (ESTRACE) 0.5 MG tablet Take 1 tablet (0.5 mg total) by mouth daily. (Patient taking differently: Take 0.25 mg by mouth daily. ) 90 tablet 4  . ezetimibe (ZETIA) 10 MG tablet Take 1 tablet (10 mg total) by mouth daily. 90 tablet 1  . HYDROcodone-acetaminophen (NORCO) 10-325 MG tablet Take 0.5 tablets by mouth daily as needed for moderate pain.   0  . ibuprofen (ADVIL,MOTRIN) 200 MG tablet Take 200 mg by mouth daily.    . metoprolol tartrate (LOPRESSOR) 25 MG tablet Take 25 mg by mouth daily.   2  . NON FORMULARY Take  2 Scoops by mouth at bedtime. Protein Kosher Collegen    . OVER THE COUNTER MEDICATION Apply 1 application topically daily as needed (pain). Hempvana    . valACYclovir (VALTREX) 500 MG tablet TAKE 1 TABLET BY MOUTH TWICE DAILY FOR 3-5 DAYS THEN DAILY AS NEEDED 90 tablet 0  . zolpidem (AMBIEN) 10 MG tablet Take 10 mg by mouth at bedtime.     . rosuvastatin (CRESTOR) 10 MG tablet Take 5 mg by mouth daily.      No facility-administered medications prior to visit.      Allergies:   Prednisone and Latex   Social History   Socioeconomic History  . Marital status: Married    Spouse name: don  . Number of children: 2  . Years of education: College  . Highest education level: Not on file  Occupational History  . Occupation: Cabin crew  Social Needs  . Financial resource strain: Not on file  . Food insecurity:    Worry: Not on file    Inability: Not on file  . Transportation needs:    Medical: Not on file    Non-medical: Not on file  Tobacco Use  . Smoking status: Former Smoker    Packs/day: 0.15    Years: 20.00    Pack years: 3.00    Types: Cigarettes    Last attempt to quit: 11/12/1998    Years since quitting: 20.1  . Smokeless tobacco: Never Used  Substance and Sexual Activity  . Alcohol use: No  . Drug use: No  . Sexual activity: Yes    Birth control/protection: Post-menopausal, Surgical    Comment: HYSTERECTOMY  Lifestyle  . Physical activity:    Days per week: Not on file    Minutes per session: Not on file  . Stress: Not on file  Relationships  . Social connections:    Talks on phone: Not on file    Gets together: Not on file    Attends religious service: Not on file    Active member of club or organization: Not on file    Attends meetings of clubs or organizations: Not on file    Relationship status: Not on file  Other Topics Concern  . Not on file  Social History Narrative   Lives at home with her husband Timmothy Sours   Right handed   Caffeine: unsweet tea,  2 glasses  daily    Social history is notable in that she has had several marriages and currently has been married for 16 years.  She has 2 children, 2 grandchildren.  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  There is a higher tobacco history, she quit in 2002.  She does yoga intermittently.  Family History:  The patient's family history includes Aneurysm in her father; COPD in her brother; Cancer in her mother; Heart disease in her paternal grandfather; Other in her sister and sister; Skin cancer in her brother and brother.   Her mother died at age 16.  Her father died at age 107 and had blood clots.  A brother died at age 83 and was exposed to agent orange.  She has a brother age 55 with cancer.  She has 5 sisters.   ROS General: Negative; No fevers, chills, or night sweats;  HEENT: Negative; No changes in vision or hearing, sinus congestion, difficulty swallowing Pulmonary: Negative; No cough, wheezing, shortness of breath, hemoptysis Cardiovascular: See HPI GI: Negative; No nausea, vomiting, diarrhea, or abdominal pain GU: Recent UTI, just started on Macrobid Musculoskeletal: Chronic low back pain, osteo-arthritis, fibromyalgia Hematologic/Oncology: Negative; no easy bruising, bleeding Endocrine: Negative; no heat/cold intolerance; no diabetes Neuro: Negative; no changes in balance, headaches Skin: Negative; No rashes or skin lesions Psychiatric: Negative; No behavioral problems, depression Sleep: Negative; No snoring, daytime sleepiness, hypersomnolence, bruxism, restless legs, hypnogognic hallucinations, no cataplexy Other comprehensive 14 point system review is negative.   PHYSICAL EXAM:   VS:  BP 122/64   Pulse (!) 59   Ht 5' 2.5" (1.588 m)   Wt 174 lb 3.2 oz (79 kg)   LMP  (LMP Unknown)   BMI 31.35 kg/m     Repeat blood pressure by me 130/70  Wt Readings from Last 3 Encounters:  01/14/19 174 lb 3.2 oz (79 kg)  12/03/18 175 lb (79.4 kg)  11/25/18 172 lb (78 kg)    General:  Alert, oriented, no distress.  Skin: normal turgor, no rashes, warm and dry HEENT: Normocephalic, atraumatic. Pupils equal round and reactive to light; sclera anicteric; extraocular muscles intact;  Nose without nasal septal hypertrophy Mouth/Parynx benign; Mallinpatti scale 2 Neck: No JVD, no carotid bruits; normal carotid upstroke Lungs: clear to ausculatation and percussion; no wheezing or rales Chest wall: without tenderness to palpitation Heart: PMI not displaced, RRR, s1 s2 normal, 1/6 systolic murmur, no diastolic murmur, no rubs, gallops, thrills, or heaves Abdomen: soft, nontender; no hepatosplenomehaly, BS+; abdominal aorta nontender and not dilated by palpation. Back: no CVA tenderness Pulses 2+ Musculoskeletal: full range of motion, normal strength, no joint deformities Extremities: no clubbing cyanosis or edema, Homan's sign negative  Neurologic: grossly nonfocal; Cranial nerves grossly wnl Psychologic: Normal mood and affect   Studies/Labs Reviewed:   ECG (independently read by me): Sinus bradycardia 59 bpm.  Normal intervals.  No ectopy.  November 20, 2018 EKG:  EKG is ordered today. Sinus Bradycardia at 58;Q wave III, no STT changes  October 06, 2018 ECG (independently read by me): Normal sinus rhythm at 66 bpm.  Nondiagnostic Q waves in lead III and aVF.  Normal intervals.  No ectopy.  No ST segment changes.  Recent Labs: BMP Latest Ref Rng & Units 11/21/2018 10/31/2018 09/22/2018  Glucose 65 - 99 mg/dL 81 99 102(H)  BUN 8 - 27 mg/dL '18 16 23  '$ Creatinine 0.57 - 1.00 mg/dL 0.89 0.74 1.03(H)  BUN/Creat Ratio 12 - 28 20  22 -  Sodium 134 - 144 mmol/L 139 141 137  Potassium 3.5 - 5.2 mmol/L 5.1 4.7 4.3  Chloride 96 - 106 mmol/L 103 106 104  CO2 20 - 29 mmol/L '23 21 25  '$ Calcium 8.7 - 10.3 mg/dL 9.9 9.6 9.5     Hepatic Function Latest Ref Rng & Units 11/21/2018 02/07/2018 05/23/2017  Total Protein 6.0 - 8.5 g/dL 6.9 8.1 7.8  Albumin 3.6 - 4.8 g/dL 4.3 4.1 4.0  AST 0 -  40 IU/L '13 13 21  '$ ALT 0 - 32 IU/L 11 11 12(L)  Alk Phosphatase 39 - 117 IU/L 83 81 84  Total Bilirubin 0.0 - 1.2 mg/dL 0.3 0.2 0.3  Bilirubin, Direct 0.1 - 0.5 mg/dL - - <0.1(L)    CBC Latest Ref Rng & Units 11/21/2018 09/22/2018 02/07/2018  WBC 3.4 - 10.8 x10E3/uL 10.6 12.8(H) 16.7(H)  Hemoglobin 11.1 - 15.9 g/dL 11.6 12.4 12.2  Hematocrit 34.0 - 46.6 % 34.9 38.8 36.6  Platelets 150 - 450 x10E3/uL 388 333 377.0   Lab Results  Component Value Date   MCV 96 11/21/2018   MCV 101.6 (H) 09/22/2018   MCV 96.5 02/07/2018   Lab Results  Component Value Date   TSH 1.440 11/21/2018   No results found for: HGBA1C   BNP No results found for: BNP  ProBNP No results found for: PROBNP   Lipid Panel     Component Value Date/Time   CHOL 146 11/21/2018 1007   TRIG 132 11/21/2018 1007   HDL 59 11/21/2018 1007   CHOLHDL 2.5 11/21/2018 1007   LDLCALC 61 11/21/2018 1007     RADIOLOGY: No results found.   Additional studies/ records that were reviewed today include:  I reviewed the records from the emergency room.  I reviewed the records from [place] [facility] at [facility] by Arville Go.   ------------------------------------------------------------------- 10/14/2018 ECHO Study Conclusions  - Left ventricle: Average global longitudinal LV strain is normal   at -18.7% The cavity size was normal. Systolic function was   normal. The estimated ejection fraction was in the range of 60%   to 65%. Wall motion was normal; there were no regional wall   motion abnormalities. The study is not technically sufficient to   allow evaluation of LV diastolic function. - Aortic valve: Trileaflet; mildly thickened, mildly calcified   leaflets. - Mitral valve: There was mild regurgitation. - Atrial septum: There was increased thickness of the septum,   consistent with lipomatous hypertrophy.   ASSESSMENT:    1. Coronary artery disease involving native coronary artery of  native heart without angina pectoris   2. Abnormal cardiac CT angiography   3. Hyperlipidemia, unspecified hyperlipidemia type   4. Cervical disc disease   5. Essential hypertension      PLAN:  Ms. Zynasia Nuon is a 68 year old female who has experienced intermittent episodes of somewhat atypical chest discomfort.  Her chest pain has been occurring off and on for the past year typically is nonexertional.  Her ECG shows normal sinus rhythm.  She has small nondiagnostic inferior Q waves.  I do not believe she has had a prior myocardial infarction.  She has stage II hypertension and her blood pressure was significantly elevated at her emergency room evaluation.  When I initially saw her I recommended the addition of amlodipine to her medical regimen. Her echo Doppler study demonstrated normal systolic without wall motion abnormalities and with normal global longitudinal strain.  There was evidence for mild aortic  sclerosis without stenosis, mild MR, and probable lipomatous hypertrophy of her atrial septum.  She  continued to experience sharp stabbing-like chest pain and previously on physical examination today she had definite tenderness to her costochondral region pectoral region suggesting her chest pain is most likely of musculoskeletal etiology.  Her CT angiogram raised the possibility of at least moderate multivessel coronary atherosclerosis and her distal LAD lesion was felt to be FFR significant.  As result cardiac catheterization was performed which did not demonstrate high-grade obstructive disease with no narrowings greater than 25% in the LAD circumflex marginal and dominant RCA.  She has subsequently been found to have multilevel cervical generative spondylolisthesis as well as cervical disc disease as noted above.  Chest pain is nonischemic.  I discussed the importance of continuing rosuvastatin since she was wanting ultimately to discontinue this.  With her mild plaque reaching a target LDL less  than 70% will be necessary and hopefully can induce some plaque regression.  She apparently has only been taking metoprolol tartrate 25 mg daily instead of previously prescribed twice a day and has continued to take amlodipine 10 mg for hypertension.  I have suggested she take the metoprolol tartrate 12.5 mg twice a day or otherwise we would switch her to long-acting succinate.  I recommended she continue the Zetia with the rosuvastatin for aggressive lipid-lowering therapy.  BMI is mildly increased at 31.35 and is consistent with mild obesity.  Weight loss and increased exercise was recommended.  She is following up with Dr. Maryjean Ka for pain management and sees Dr. Vertell Limber.  Her primary physician is Dr. Nancy Fetter.  As long as she remains stable from a cardiovascular standpoint, I will see her in 1 year for reevaluation or sooner if problems arise.   Medication Adjustments/Labs and Tests Ordered: Current medicines are reviewed at length with the patient today.  Concerns regarding medicines are outlined above.  Medication changes, Labs and Tests ordered today are listed in the Patient Instructions below. Patient Instructions  Medication Instructions:  The current medical regimen is effective;  continue present plan and medications.  If you need a refill on your cardiac medications before your next appointment, please call your pharmacy.    Follow-Up: At [facility], you and your health needs are our priority.  As part of our continuing mission to provide you with exceptional heart care, we have created designated Provider Care Teams.  These Care Teams include your primary Cardiologist (physician) and Advanced Practice Providers (APPs -  Physician Assistants and Nurse Practitioners) who all work together to provide you with the care you need, when you need it. You will need a follow up appointment in 12 months.  Please call our office 2 months in advance to schedule this appointment.  You may see Dr.Kelly or  one of the following Advanced Practice Providers on your designated Care Team: Almyra Deforest, [place] . Fabian Sharp, PA-C        Signed, Shelva Majestic, MD  01/16/2019 5:45 PM    [facility] [address] [place], [place], [place]  [address] Phone: [phone]

## 2019-01-16 ENCOUNTER — Encounter: Payer: Self-pay | Admitting: Cardiovascular Disease

## 2019-01-22 ENCOUNTER — Encounter: Payer: PPO | Admitting: Neurology

## 2019-01-22 DIAGNOSIS — I10 Essential (primary) hypertension: Secondary | ICD-10-CM | POA: Diagnosis not present

## 2019-01-22 DIAGNOSIS — Z6830 Body mass index (BMI) 30.0-30.9, adult: Secondary | ICD-10-CM | POA: Diagnosis not present

## 2019-01-22 DIAGNOSIS — M4316 Spondylolisthesis, lumbar region: Secondary | ICD-10-CM | POA: Diagnosis not present

## 2019-01-22 DIAGNOSIS — M5416 Radiculopathy, lumbar region: Secondary | ICD-10-CM | POA: Diagnosis not present

## 2019-02-20 ENCOUNTER — Other Ambulatory Visit: Payer: Self-pay | Admitting: Women's Health

## 2019-02-20 DIAGNOSIS — Z79890 Hormone replacement therapy (postmenopausal): Secondary | ICD-10-CM

## 2019-04-02 DIAGNOSIS — M5416 Radiculopathy, lumbar region: Secondary | ICD-10-CM | POA: Diagnosis not present

## 2019-04-15 ENCOUNTER — Other Ambulatory Visit: Payer: Self-pay | Admitting: Women's Health

## 2019-05-06 DIAGNOSIS — M5412 Radiculopathy, cervical region: Secondary | ICD-10-CM | POA: Diagnosis not present

## 2019-05-06 DIAGNOSIS — M4316 Spondylolisthesis, lumbar region: Secondary | ICD-10-CM | POA: Diagnosis not present

## 2019-05-15 ENCOUNTER — Other Ambulatory Visit: Payer: Self-pay | Admitting: Cardiovascular Disease

## 2019-05-18 NOTE — Telephone Encounter (Signed)
Rx(s) sent to pharmacy electronically.  

## 2019-07-02 DIAGNOSIS — M5412 Radiculopathy, cervical region: Secondary | ICD-10-CM | POA: Diagnosis not present

## 2019-07-10 ENCOUNTER — Other Ambulatory Visit: Payer: Self-pay | Admitting: Anesthesiology

## 2019-07-10 DIAGNOSIS — M5412 Radiculopathy, cervical region: Secondary | ICD-10-CM

## 2019-08-01 ENCOUNTER — Ambulatory Visit
Admission: RE | Admit: 2019-08-01 | Discharge: 2019-08-01 | Disposition: A | Discharge status: Home / Self Care | Payer: PPO | Source: Ambulatory Visit | Attending: Anesthesiology | Admitting: Anesthesiology

## 2019-08-01 ENCOUNTER — Other Ambulatory Visit: Payer: Self-pay

## 2019-08-01 DIAGNOSIS — M5412 Radiculopathy, cervical region: Secondary | ICD-10-CM

## 2019-08-01 DIAGNOSIS — M4802 Spinal stenosis, cervical region: Secondary | ICD-10-CM | POA: Diagnosis not present

## 2019-08-11 DIAGNOSIS — M5416 Radiculopathy, lumbar region: Secondary | ICD-10-CM | POA: Diagnosis not present

## 2019-08-19 DIAGNOSIS — M542 Cervicalgia: Secondary | ICD-10-CM | POA: Diagnosis not present

## 2019-08-19 DIAGNOSIS — M5412 Radiculopathy, cervical region: Secondary | ICD-10-CM | POA: Diagnosis not present

## 2019-08-19 DIAGNOSIS — M50223 Other cervical disc displacement at C6-C7 level: Secondary | ICD-10-CM | POA: Diagnosis not present

## 2019-08-19 DIAGNOSIS — M50222 Other cervical disc displacement at C5-C6 level: Secondary | ICD-10-CM | POA: Diagnosis not present

## 2019-08-20 ENCOUNTER — Telehealth: Payer: Self-pay | Admitting: *Deleted

## 2019-08-20 NOTE — Telephone Encounter (Signed)
   [facility] Pre-operative Risk Assessment    Request for surgical clearance:  1. What type of surgery is being performed? CERVICAL FUSION   2. When is this surgery scheduled? 09/01/19   3. What type of clearance is required (medical clearance vs. Pharmacy clearance to hold med vs. Both)? BOTH   4. Are there any medications that need to be held prior to surgery and how long? ASPIRIN   5. Practice name and name of physician performing surgery? [facility]    6. What is your office phone number? [phone]    7.   What is your office fax number? [place]   Anesthesia type (None, local, MAC, general) ? GENERAL

## 2019-08-24 NOTE — Telephone Encounter (Signed)
Dr. Claiborne Billings  Can you make recommendations on holding this patients ASA for upcoming cervical fusion planned for 09/01/19.   You last saw her 01/14/2019 in follow up. She previously had an abnormal coronary CT angiogram in which she then underwent cath for definitive coronary evaluation 11/25/2018 which revealed mild multivessel nonobstructive CAD with narrowings no greater than 25% in the LAD, circumflex marginal, and dominant RCA.  Medical therapy was recommended with aggressive lipid-lowering therapy with target LDL less than 70.  It was felt that the patient's chest pain had atypical features and most likely was nonischemic.  Please send your recommendations to the pre-op pool   Thank you Sharee Pimple

## 2019-08-26 DIAGNOSIS — Z1159 Encounter for screening for other viral diseases: Secondary | ICD-10-CM | POA: Diagnosis not present

## 2019-08-27 NOTE — Telephone Encounter (Signed)
   Primary Cardiologist: Shelva Majestic, MD  Chart reviewed as part of pre-operative protocol coverage. Given past medical history and time since last visit, based on ACC/AHA guidelines, Robin Arellano would be at acceptable risk for the planned procedure without further cardiovascular testing.   **Dr. Claiborne Billings has given permission for her to hold the aspirin for the procedure.**  I will route this recommendation to the requesting party via Epic fax function and remove from pre-op pool.  Please call with questions.  Robin Skalla, PA-C 08/27/2019, 10:33 AM   5. Meta    6. What is your office phone number? [phone]    7.   What is your office fax number? [place]

## 2019-08-31 DIAGNOSIS — M5412 Radiculopathy, cervical region: Secondary | ICD-10-CM | POA: Diagnosis not present

## 2019-09-01 DIAGNOSIS — M50123 Cervical disc disorder at C6-C7 level with radiculopathy: Secondary | ICD-10-CM | POA: Diagnosis not present

## 2019-09-01 DIAGNOSIS — M50122 Cervical disc disorder at C5-C6 level with radiculopathy: Secondary | ICD-10-CM | POA: Diagnosis not present

## 2019-09-01 DIAGNOSIS — M4726 Other spondylosis with radiculopathy, lumbar region: Secondary | ICD-10-CM | POA: Diagnosis not present

## 2019-09-01 DIAGNOSIS — M50223 Other cervical disc displacement at C6-C7 level: Secondary | ICD-10-CM | POA: Diagnosis not present

## 2019-09-01 DIAGNOSIS — M4722 Other spondylosis with radiculopathy, cervical region: Secondary | ICD-10-CM | POA: Diagnosis not present

## 2019-09-01 DIAGNOSIS — M50222 Other cervical disc displacement at C5-C6 level: Secondary | ICD-10-CM | POA: Diagnosis not present

## 2019-09-21 DIAGNOSIS — M5412 Radiculopathy, cervical region: Secondary | ICD-10-CM | POA: Diagnosis not present

## 2019-10-12 ENCOUNTER — Encounter: Payer: Self-pay | Admitting: Gastroenterology

## 2019-10-12 ENCOUNTER — Telehealth: Payer: Self-pay | Admitting: *Deleted

## 2019-10-12 MED ORDER — VALACYCLOVIR HCL 500 MG PO TABS: ORAL_TABLET | ORAL | 0 refills | Status: DC

## 2019-10-12 NOTE — Telephone Encounter (Signed)
Patient called requesting refills on Valtrex 500 mg tablet, reports having back surgery so no annual exam yet. Note placed on Rx to schedule.

## 2019-10-13 ENCOUNTER — Other Ambulatory Visit: Payer: Self-pay | Admitting: Internal Medicine

## 2019-10-13 DIAGNOSIS — N63 Unspecified lump in unspecified breast: Secondary | ICD-10-CM

## 2019-10-28 DIAGNOSIS — M542 Cervicalgia: Secondary | ICD-10-CM | POA: Diagnosis not present

## 2019-11-03 ENCOUNTER — Telehealth: Payer: Self-pay | Admitting: Gastroenterology

## 2019-11-03 NOTE — Telephone Encounter (Signed)
I spoke with the patient. She was telling my that she has recently had Cervical neck surgery and is still following up with the surgeon from that. She has not been released from that surgery. I explained that she should get clearance from the surgeon before she has this colonoscopy. Patient is to follow up with surgeon in January and she will call us back when she gets clearance from her surgeon. Colonoscopy and pre-visit is cancelled at this time-pt is aware.

## 2019-11-03 NOTE — Telephone Encounter (Signed)
Pt is scheduled for a PV tomorrow 11/04/19 and stated that her fever is 100.9 degrees F.  Please advise.

## 2019-11-10 ENCOUNTER — Other Ambulatory Visit: Payer: Self-pay | Admitting: Women's Health

## 2019-11-10 NOTE — Telephone Encounter (Signed)
Robin Arellano a staff message to contact pt. To schedule CE since very overdue.

## 2019-11-13 ENCOUNTER — Other Ambulatory Visit: Payer: Self-pay | Admitting: Cardiovascular Disease

## 2019-11-16 ENCOUNTER — Other Ambulatory Visit: Payer: Self-pay

## 2019-11-16 MED ORDER — AMLODIPINE BESYLATE 10 MG PO TABS: 10.0000 mg | ORAL_TABLET | Freq: Every day | ORAL | 3 refills | Status: DC

## 2019-11-18 ENCOUNTER — Encounter: Payer: PPO | Admitting: Gastroenterology

## 2019-11-24 ENCOUNTER — Other Ambulatory Visit: Payer: PPO

## 2019-12-02 DIAGNOSIS — M50223 Other cervical disc displacement at C6-C7 level: Secondary | ICD-10-CM | POA: Diagnosis not present

## 2019-12-02 DIAGNOSIS — M542 Cervicalgia: Secondary | ICD-10-CM | POA: Diagnosis not present

## 2019-12-02 DIAGNOSIS — M5412 Radiculopathy, cervical region: Secondary | ICD-10-CM | POA: Diagnosis not present

## 2019-12-02 DIAGNOSIS — M50222 Other cervical disc displacement at C5-C6 level: Secondary | ICD-10-CM | POA: Diagnosis not present

## 2019-12-10 ENCOUNTER — Telehealth: Payer: Self-pay | Admitting: *Deleted

## 2019-12-10 ENCOUNTER — Other Ambulatory Visit: Payer: Self-pay | Admitting: Cardiovascular Disease

## 2019-12-10 ENCOUNTER — Ambulatory Visit
Admission: RE | Admit: 2019-12-10 | Discharge: 2019-12-10 | Disposition: A | Discharge status: Home / Self Care | Payer: PPO | Source: Ambulatory Visit | Attending: Internal Medicine | Admitting: Internal Medicine

## 2019-12-10 ENCOUNTER — Other Ambulatory Visit: Payer: Self-pay | Admitting: Internal Medicine

## 2019-12-10 ENCOUNTER — Other Ambulatory Visit: Payer: Self-pay

## 2019-12-10 DIAGNOSIS — N63 Unspecified lump in unspecified breast: Secondary | ICD-10-CM

## 2019-12-10 DIAGNOSIS — N6011 Diffuse cystic mastopathy of right breast: Secondary | ICD-10-CM | POA: Diagnosis not present

## 2019-12-10 DIAGNOSIS — N6012 Diffuse cystic mastopathy of left breast: Secondary | ICD-10-CM | POA: Diagnosis not present

## 2019-12-10 IMAGING — US US BREAST*R* LIMITED INC AXILLA
1 series · 6 of 6 positions shown · non-contrast
Comparison: Previous exam(s).

CLINICAL DATA: Patient presents for evaluation of palpable
abnormality within the left breast. Patient states that she is now
unable to feel the area of palpable concern.

EXAM:
DIGITAL DIAGNOSTIC BILATERAL MAMMOGRAM WITH CAD AND TOMO
ULTRASOUND BILATERAL BREAST

[Series 1: us breast*right* limited inc axilla · 0.05mm/px · 6 acquisitions, 6 frames shown]
[im 1/6]
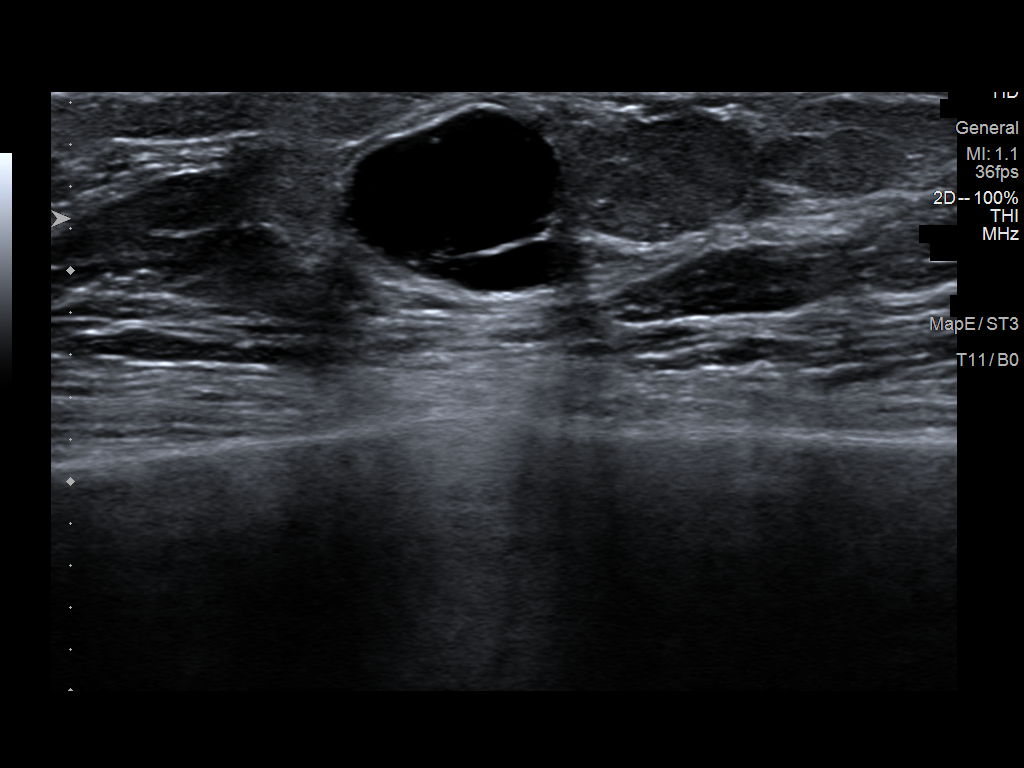
[im 2/6]
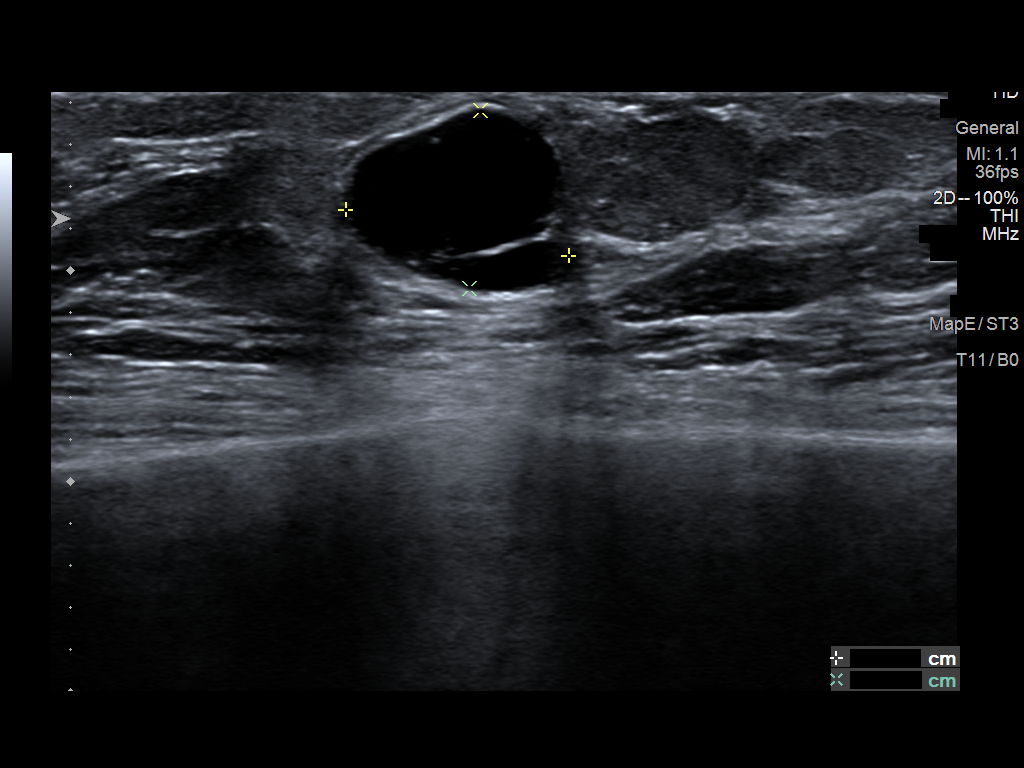
[im 3/6]
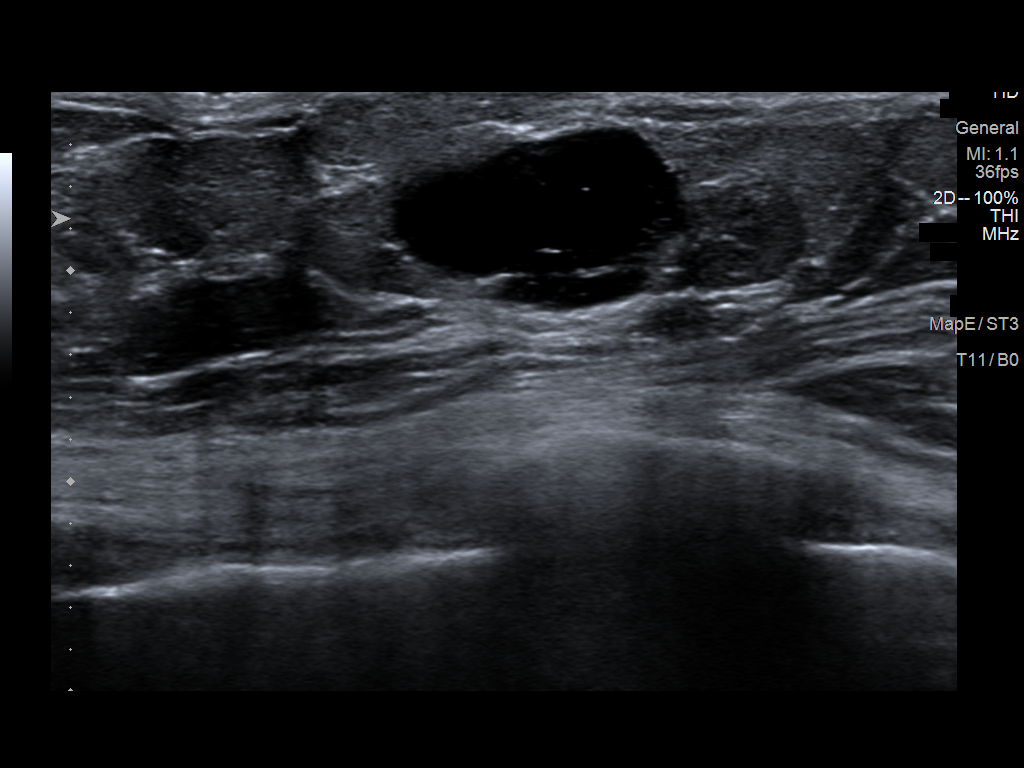
[im 4/6]
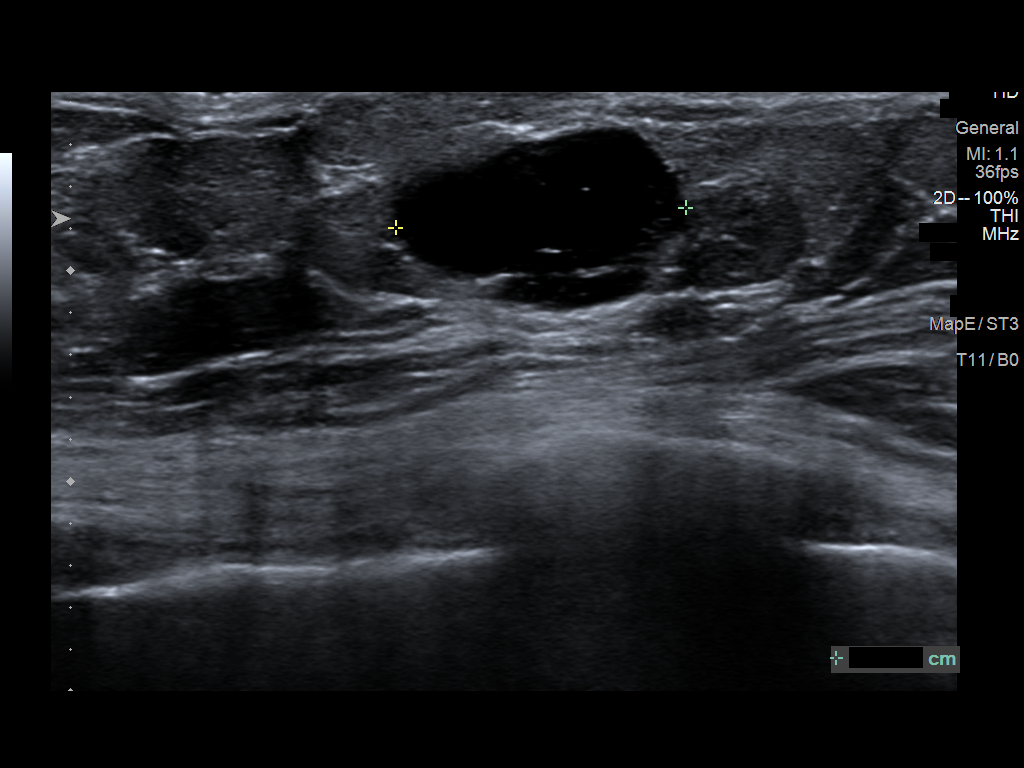
[im 5/6]
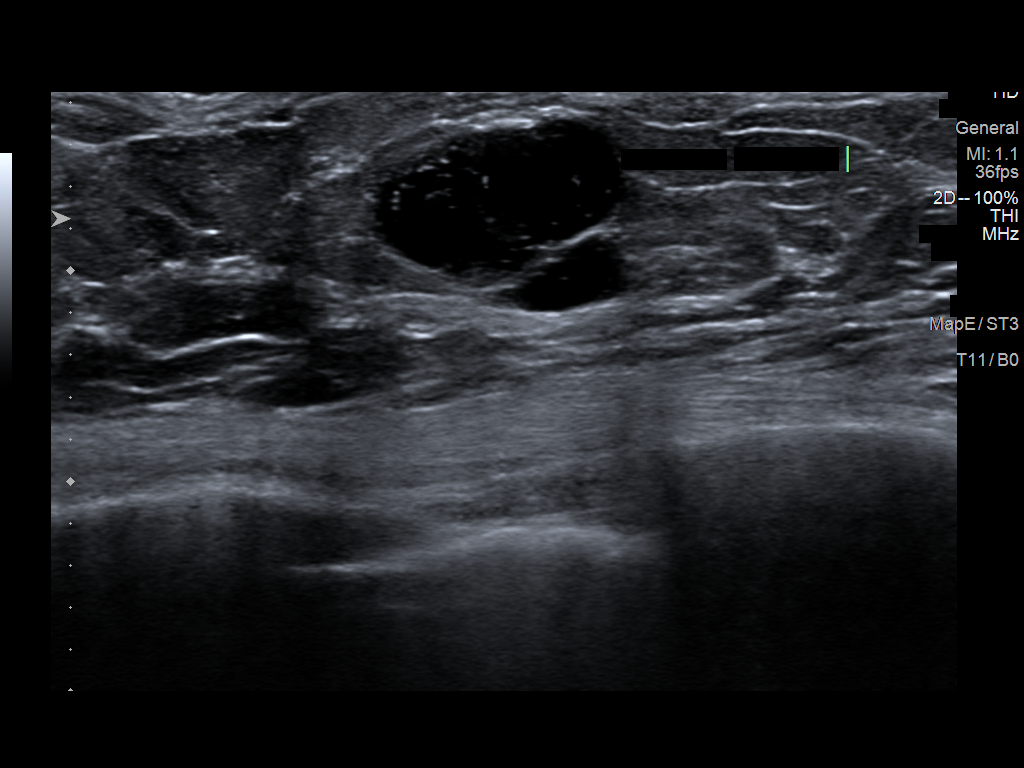
[im 6/6]
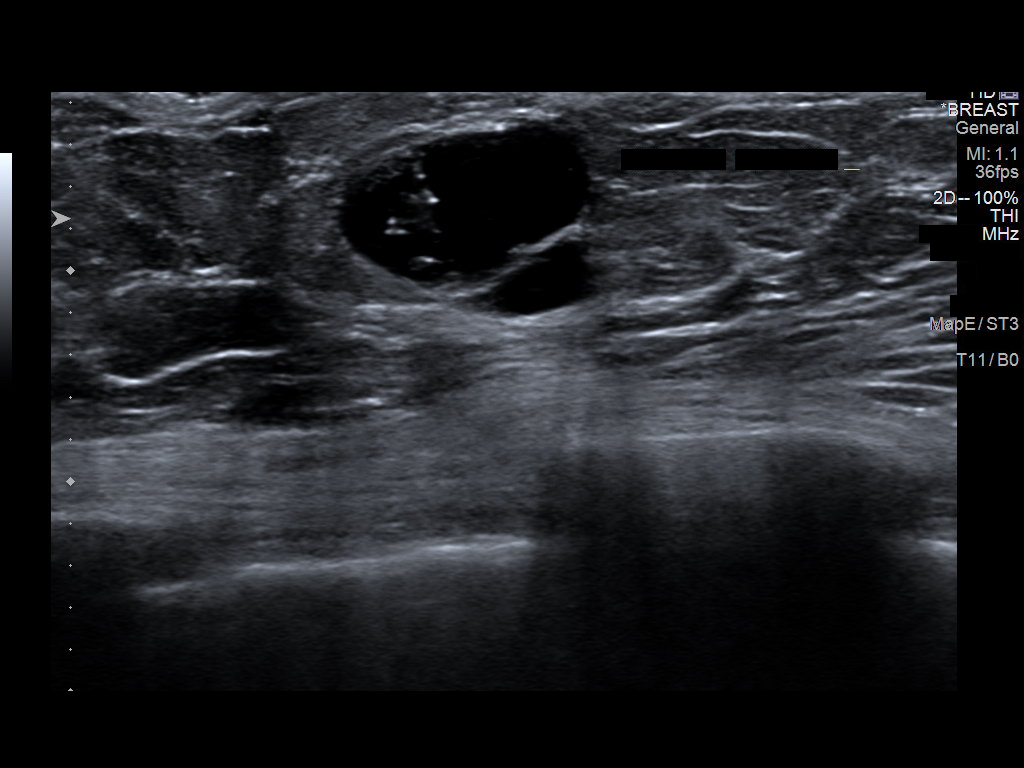

[6 of 6 positions shown; findings below may reference images not displayed]

ACR Breast Density Category c: The breast tissue is heterogeneously
dense, which may obscure small masses.
FINDINGS: There is an oval circumscribed mass within the lower outer right
breast posterior depth. No additional concerning masses,
calcifications or distortion identified within either breast.

Mammographic images were processed with CAD.

Targeted ultrasound is performed, showing a 9 x 11 x 14 mm
complicated cyst with mobile internal debris right breast 8 o'clock
position 7 cm from nipple corresponding with mammographic
abnormality.

Within the left breast 11 o'clock position 5 cm from nipple at the
patient reported site of palpable concern there is a 12 x 4 x 7 mm
simple cyst.
IMPRESSION: No mammographic evidence for malignancy.  Bilateral breast cysts.

RECOMMENDATION:
Return to annual screening mammography.

Continued clinical evaluation for left breast palpable area of
concern.

I have discussed the findings and recommendations with the patient.
If applicable, a reminder letter will be sent to the patient
regarding the next appointment.

BI-RADS CATEGORY  2: Benign.

## 2019-12-10 IMAGING — MG DIGITAL DIAGNOSTIC BILAT W/ TOMO W/ CAD
7 series · 8 of 23 positions shown · non-contrast
Comparison: Previous exam(s).

CLINICAL DATA: Patient presents for evaluation of palpable
abnormality within the left breast. Patient states that she is now
unable to feel the area of palpable concern.

EXAM:
DIGITAL DIAGNOSTIC BILATERAL MAMMOGRAM WITH CAD AND TOMO
ULTRASOUND BILATERAL BREAST

[L MLO synth-2D]
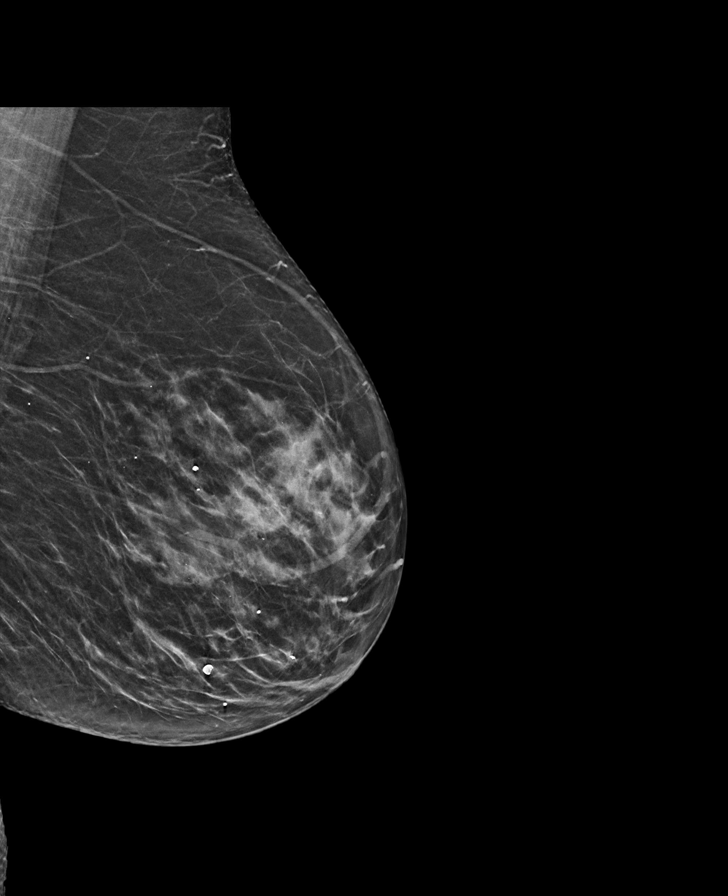

[R CC synth-2D]
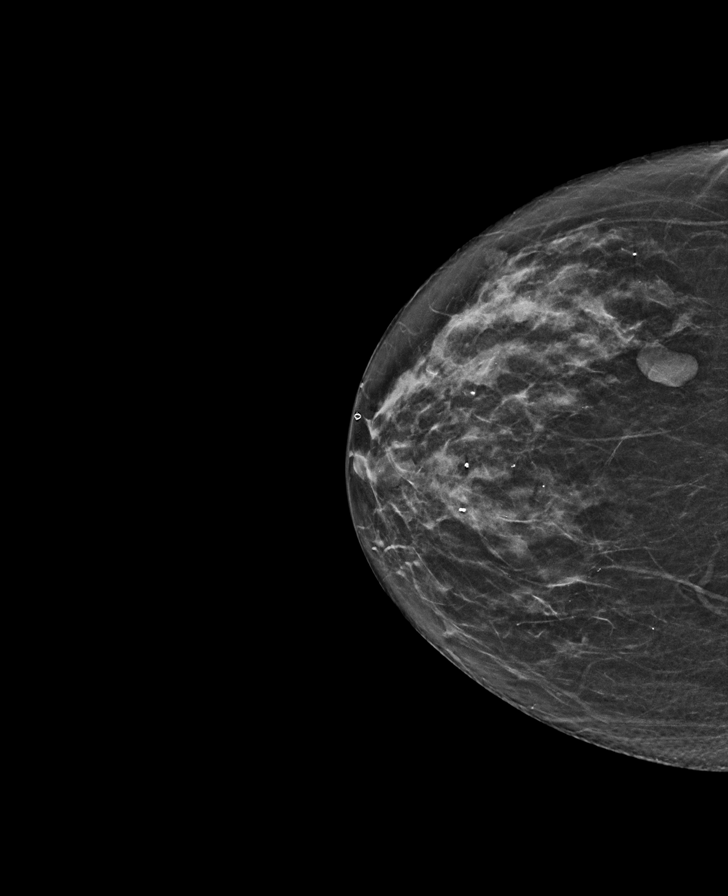

[L CC synth-2D]
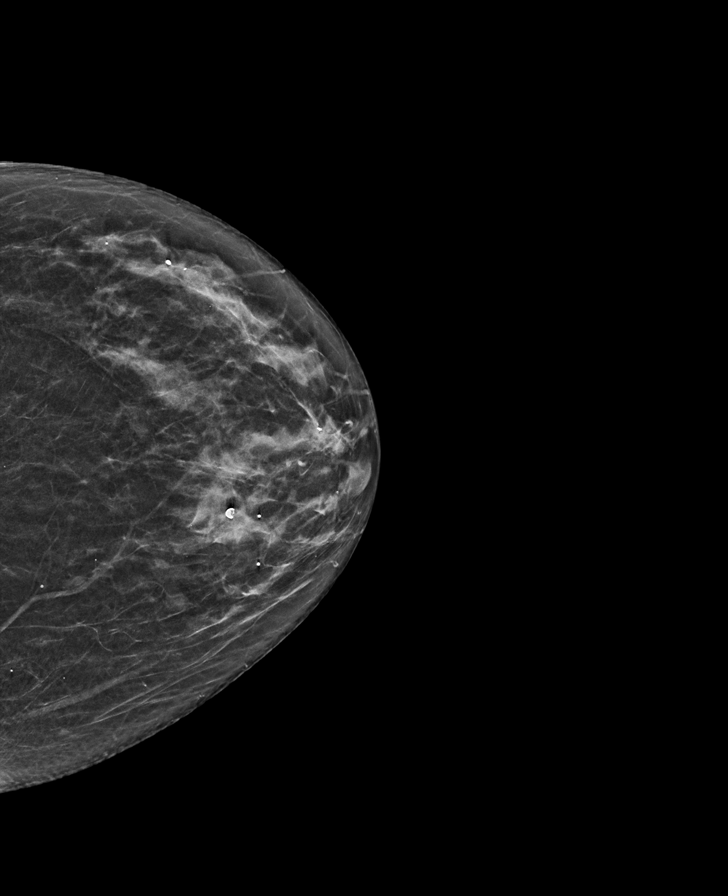

[L CC tomo · 2 of 57 frames shown]
[frame 19/57]
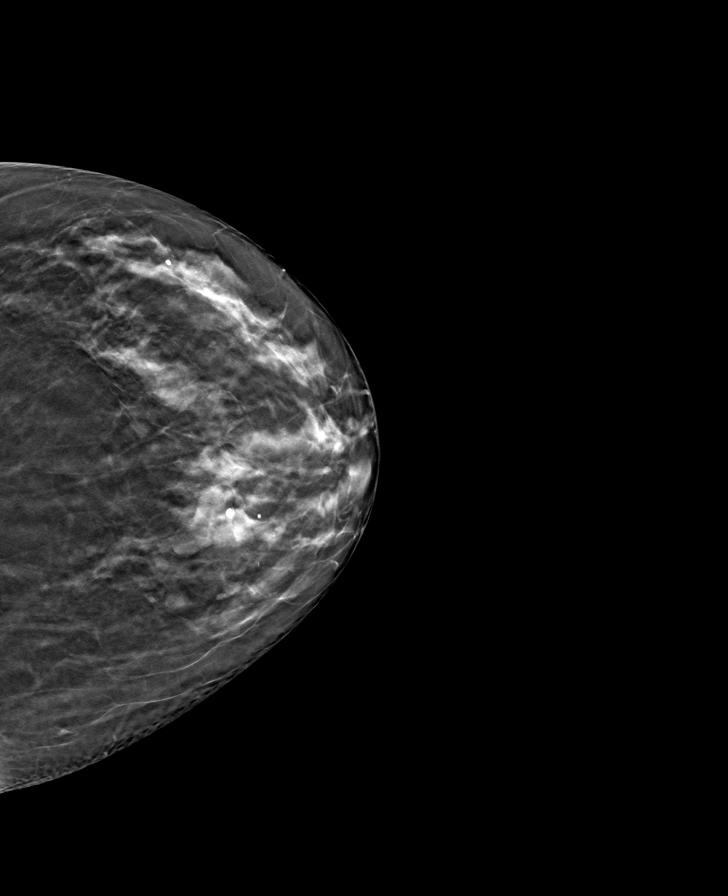
[frame 29/57]
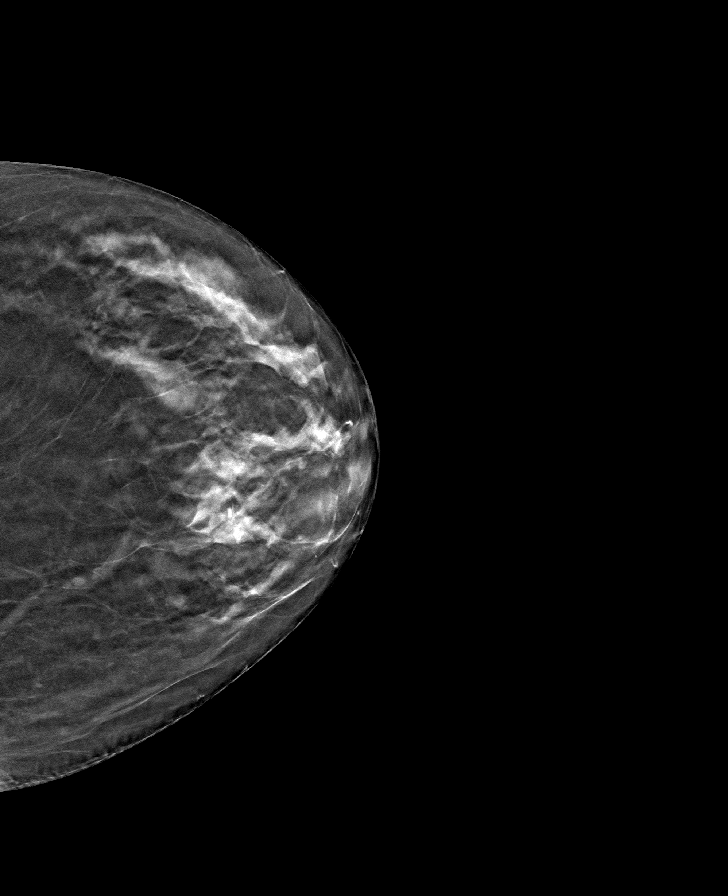

[L MLO tomo · tomo slice 30/59.0]
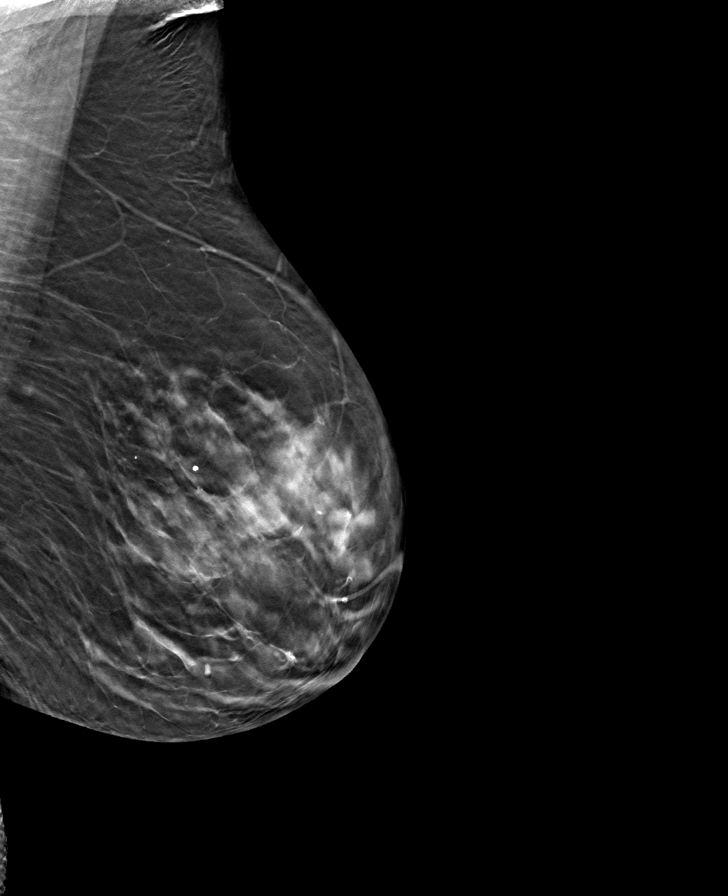

[R MLO tomo · tomo slice 29/57.0]
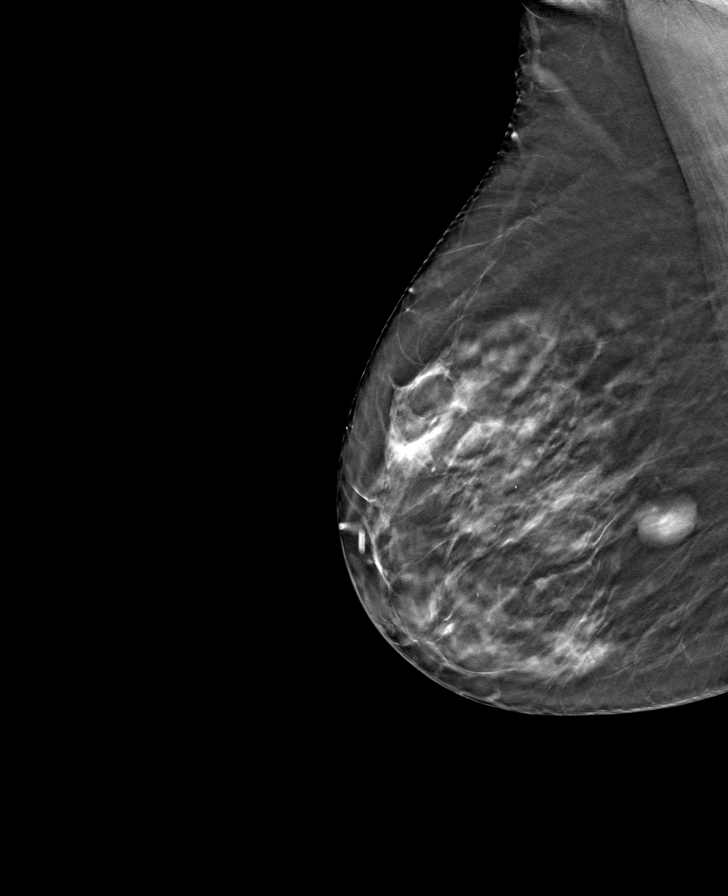

[R CC tomo · tomo slice 28/55.0]
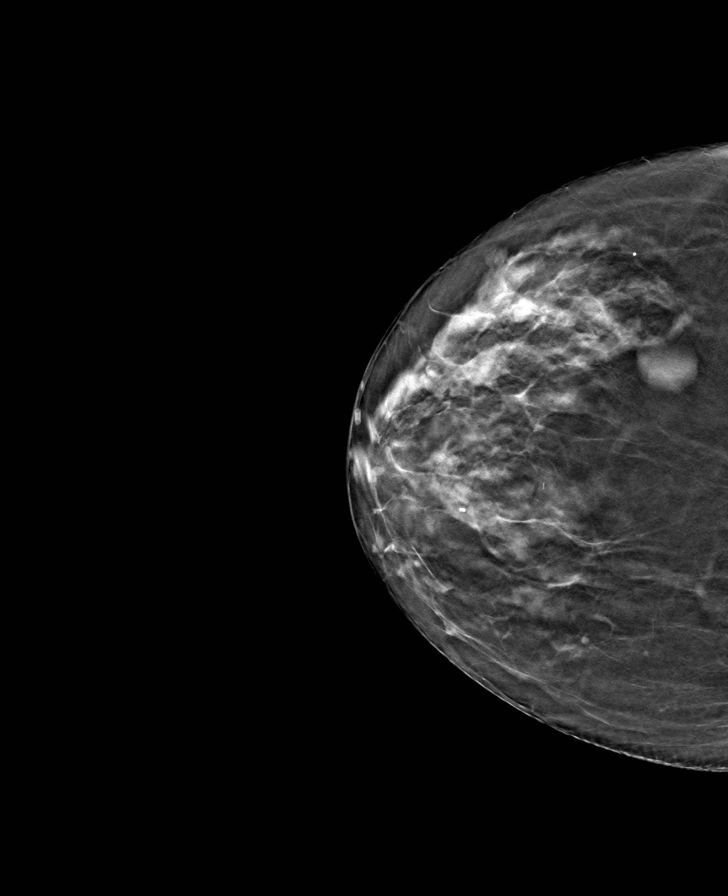

[8 of 23 positions shown; findings below may reference images not displayed]

ACR Breast Density Category c: The breast tissue is heterogeneously
dense, which may obscure small masses.
FINDINGS: There is an oval circumscribed mass within the lower outer right
breast posterior depth. No additional concerning masses,
calcifications or distortion identified within either breast.

Mammographic images were processed with CAD.

Targeted ultrasound is performed, showing a 9 x 11 x 14 mm
complicated cyst with mobile internal debris right breast 8 o'clock
position 7 cm from nipple corresponding with mammographic
abnormality.

Within the left breast 11 o'clock position 5 cm from nipple at the
patient reported site of palpable concern there is a 12 x 4 x 7 mm
simple cyst.
IMPRESSION: No mammographic evidence for malignancy.  Bilateral breast cysts.

RECOMMENDATION:
Return to annual screening mammography.

Continued clinical evaluation for left breast palpable area of
concern.

I have discussed the findings and recommendations with the patient.
If applicable, a reminder letter will be sent to the patient
regarding the next appointment.

BI-RADS CATEGORY  2: Benign.

## 2019-12-10 IMAGING — US US BREAST*L* LIMITED INC AXILLA
1 series · 5 of 5 positions shown · non-contrast
Comparison: Previous exam(s).

CLINICAL DATA: Patient presents for evaluation of palpable
abnormality within the left breast. Patient states that she is now
unable to feel the area of palpable concern.

EXAM:
DIGITAL DIAGNOSTIC BILATERAL MAMMOGRAM WITH CAD AND TOMO
ULTRASOUND BILATERAL BREAST

[Series 1: us breast*left* limited inc axilla · 0.06mm/px · 5 of 5 slices shown]
[im 1/5]
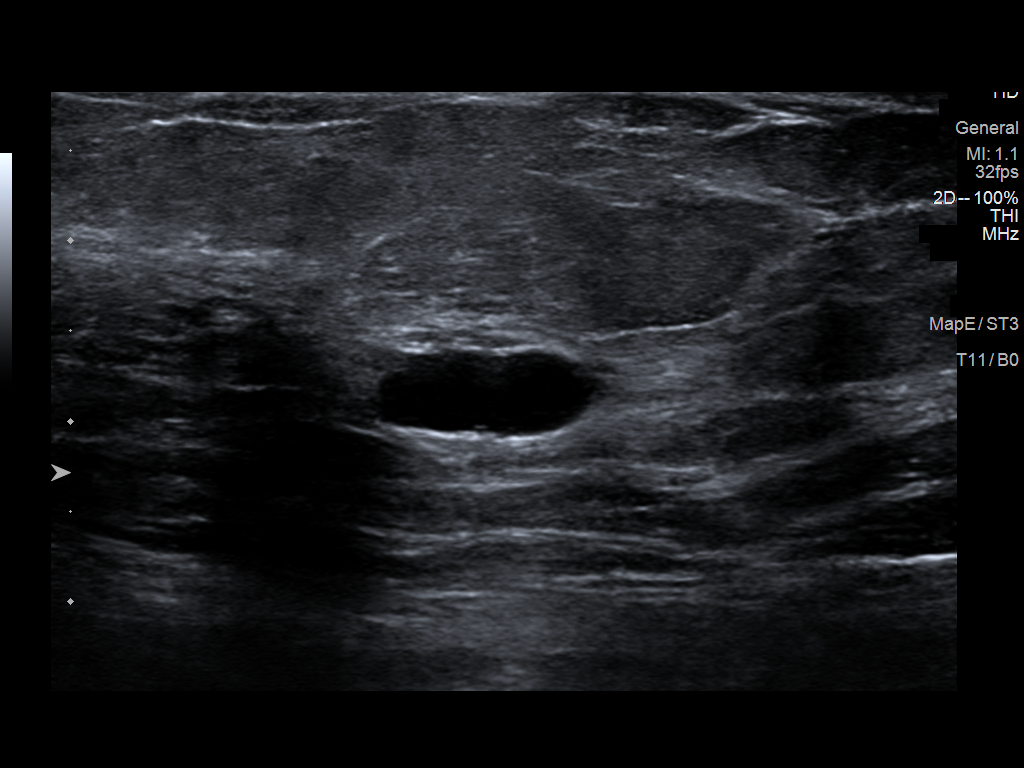
[im 2/5]
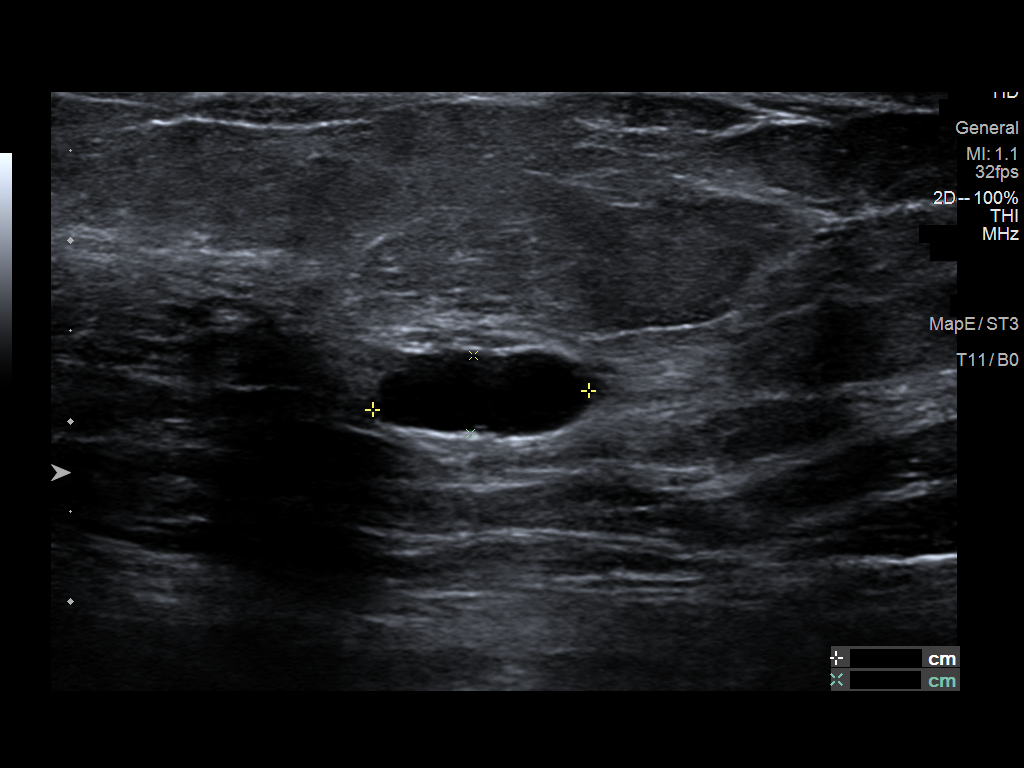
[im 3/5]
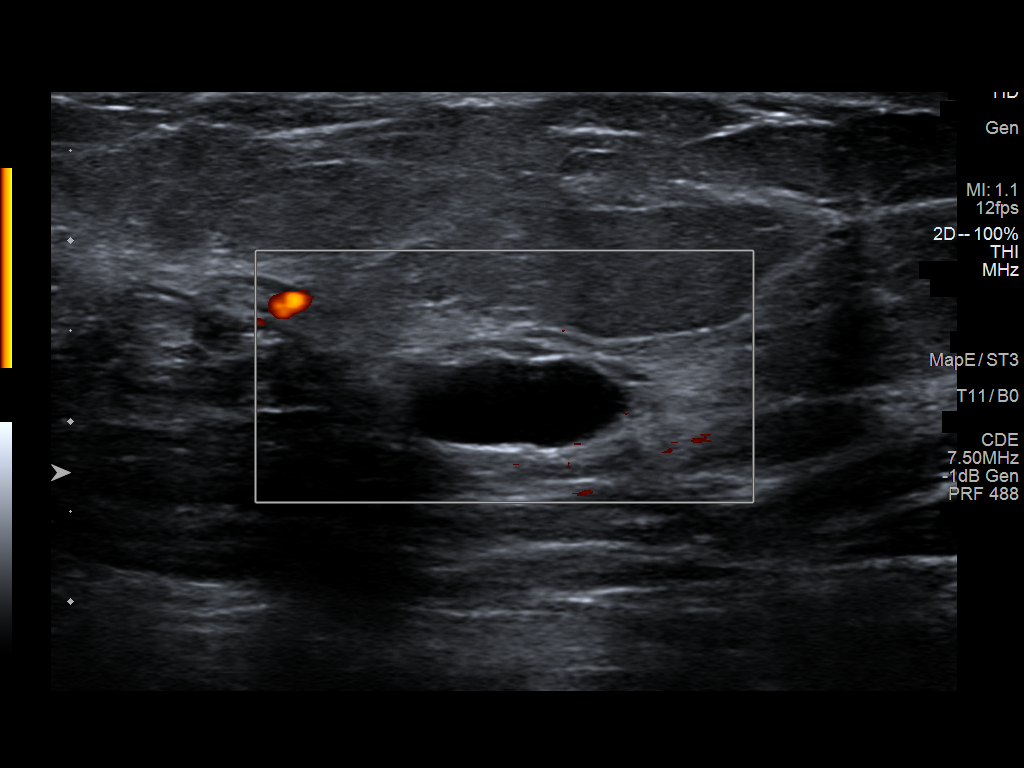
[im 4/5]
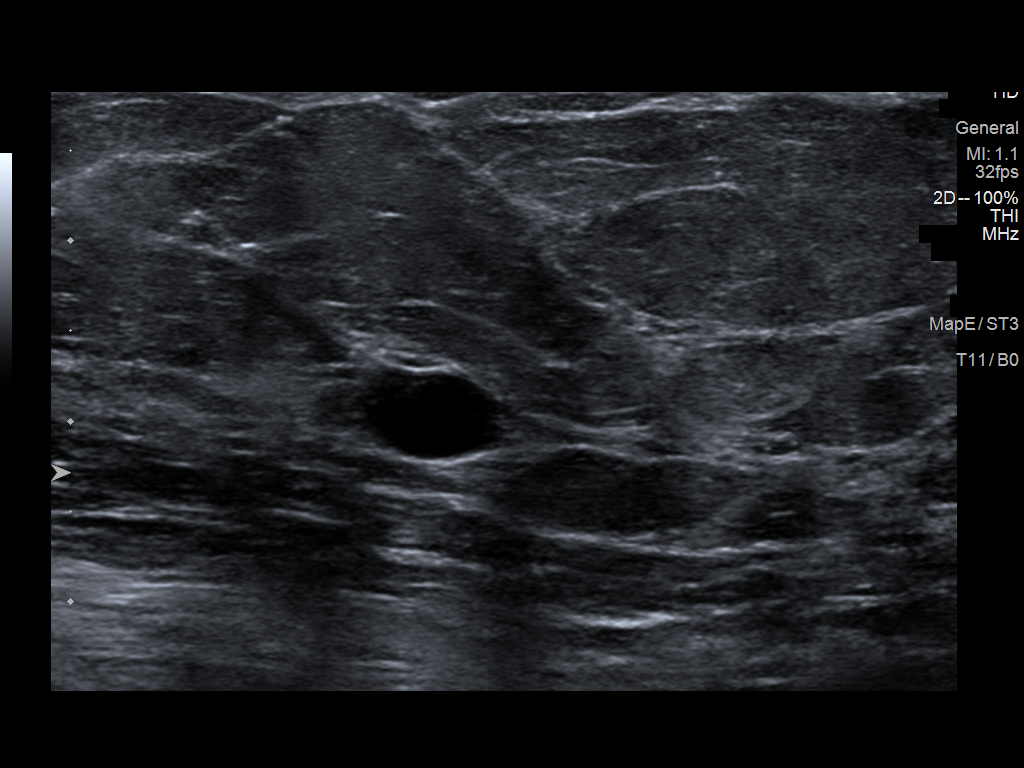
[im 5/5]
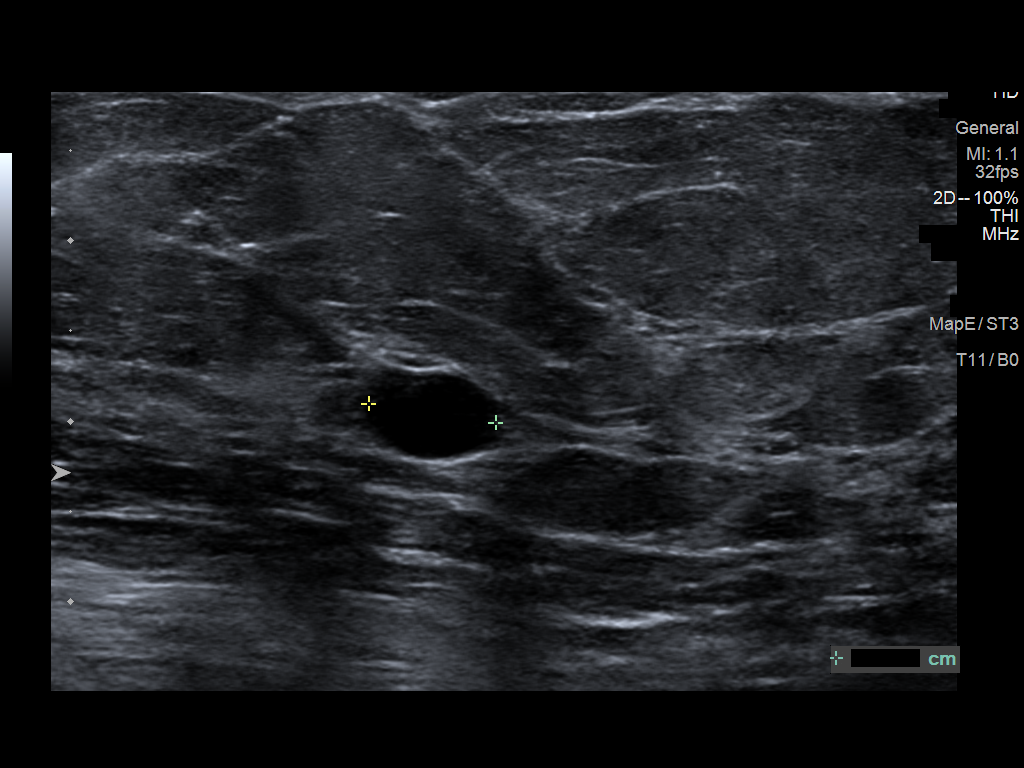

[5 of 5 positions shown; findings below may reference images not displayed]

ACR Breast Density Category c: The breast tissue is heterogeneously
dense, which may obscure small masses.
FINDINGS: There is an oval circumscribed mass within the lower outer right
breast posterior depth. No additional concerning masses,
calcifications or distortion identified within either breast.

Mammographic images were processed with CAD.

Targeted ultrasound is performed, showing a 9 x 11 x 14 mm
complicated cyst with mobile internal debris right breast 8 o'clock
position 7 cm from nipple corresponding with mammographic
abnormality.

Within the left breast 11 o'clock position 5 cm from nipple at the
patient reported site of palpable concern there is a 12 x 4 x 7 mm
simple cyst.
IMPRESSION: No mammographic evidence for malignancy.  Bilateral breast cysts.

RECOMMENDATION:
Return to annual screening mammography.

Continued clinical evaluation for left breast palpable area of
concern.

I have discussed the findings and recommendations with the patient.
If applicable, a reminder letter will be sent to the patient
regarding the next appointment.

BI-RADS CATEGORY  2: Benign.

## 2019-12-10 MED ORDER — VALACYCLOVIR HCL 500 MG PO TABS: ORAL_TABLET | ORAL | 0 refills | Status: AC

## 2019-12-10 NOTE — Telephone Encounter (Signed)
Patient called c/o UTI requesting medication, I explained office visit for UTI's. Patient reports having this x 7 days, and has been taking AZO and drinking cranberry juice. Patient declined office visit and said she will see Elon Alas, NP on 12/16/19. She did ask if I could refill her valtrex. Rx sent. I did explain if she would like OV for treatment to call and schedule with our office or if after hours urgent care. Patient verbalized she understood.

## 2019-12-14 DIAGNOSIS — M5416 Radiculopathy, lumbar region: Secondary | ICD-10-CM | POA: Diagnosis not present

## 2019-12-15 ENCOUNTER — Other Ambulatory Visit: Payer: Self-pay

## 2019-12-16 ENCOUNTER — Ambulatory Visit (INDEPENDENT_AMBULATORY_CARE_PROVIDER_SITE_OTHER): Payer: PPO | Admitting: Women's Health

## 2019-12-16 ENCOUNTER — Encounter: Payer: Self-pay | Admitting: Women's Health

## 2019-12-16 VITALS — BP 120/68 | Ht 62.0 in | Wt 171.0 lb

## 2019-12-16 DIAGNOSIS — Z01411 Encounter for gynecological examination (general) (routine) with abnormal findings: Secondary | ICD-10-CM | POA: Diagnosis not present

## 2019-12-16 DIAGNOSIS — Z79890 Hormone replacement therapy (postmenopausal): Secondary | ICD-10-CM | POA: Diagnosis not present

## 2019-12-16 DIAGNOSIS — R300 Dysuria: Secondary | ICD-10-CM | POA: Diagnosis not present

## 2019-12-16 MED ORDER — SULFAMETHOXAZOLE-TRIMETHOPRIM 800-160 MG PO TABS: 1.0000 | ORAL_TABLET | Freq: Two times a day (BID) | ORAL | 0 refills | Status: DC

## 2019-12-16 MED ORDER — ESTRADIOL 0.5 MG PO TABS: ORAL_TABLET | ORAL | 2 refills | Status: DC

## 2019-12-16 NOTE — Progress Notes (Signed)
Robin Arellano 11-May-1951 [id number]    History:    Presents for breast and pelvic exam.  1994 TAH with BSO on 0.25 estradiol with continued hot flushes.  Normal Pap and mammogram history.  2000 1814 mm benign colon polyp was to return in 6 months but has not been back due to ruptured cervical disc surgery repair, 11/2018 angiographically, caring for a sick brother, Covid and has had to cancel due to illness on one occasion.  History of osteoarthritis.  Declines Shingrix and pneumonia vaccines but does plan to get the Covid vaccine.  Lumbar disc problem with the pedis managing.  HSV no outbreaks take suppression therapy.  2017 normal DEXA.  Past medical history, past surgical history, family history and social history were all reviewed and documented in the EPIC chart.  Retired [employer].  Two children daughter struggles with mental health problems.  ROS:  A ROS was performed and pertinent positives and negatives are included.  Exam:  Vitals:   12/16/19 1506  BP: 120/68  Weight: 171 lb (77.6 kg)  Height: 5\' 2"  (1.575 m)   Body mass index is 31.28 kg/m.   General appearance:  Normal Thyroid:  Symmetrical, normal in size, without palpable masses or nodularity. Respiratory  Auscultation:  Clear without wheezing or rhonchi Cardiovascular  Auscultation:  Regular rate, without rubs, murmurs or gallops  Edema/varicosities:  Not grossly evident Abdominal  Soft,nontender, without masses, guarding or rebound.  Liver/spleen:  No organomegaly noted  Hernia:  None appreciated  Skin  Inspection:  Grossly normal   Breasts: Examined lying and sitting.     Right: Without masses, retractions, discharge or axillary adenopathy.     Left: Without masses, retractions, discharge or axillary adenopathy. Gentitourinary   Inguinal/mons:  Normal without inguinal adenopathy  External genitalia:  Normal  BUS/Urethra/Skene's glands:  Normal  Vagina:  Normal  Cervix: And uterus absent  Adnexa/parametria:      Rt: Without masses or tenderness.   Lt: Without masses or tenderness.  Anus and perineum: Normal  Digital rectal exam: Normal sphincter tone without palpated masses or tenderness  Assessment/Plan:  69 y.o. MWF G2, P2 for breast and pelvic exam with complaint of urinary frequency, urgency, pain and burning for the past few days.  UA: Trace leukocytes, 20-40 WBCs, no RBCs, 10-20 squamous epithelials, many bacteria  1994 TAH with BSO on estradiol 0.25 mg daily UTI 1/20 angiography Anxiety/depression, hypercholesteremia, hypertension-primary care manages labs and meds. Osteoarthritis, lumbar disc problem-orthopedist managing  Plan: Septra twice daily for 3 days prescription, proper use given and reviewed.  Instructed to call if symptoms persist.  Urine culture pending.  SBEs, annual screening mammogram, calcium rich foods, vitamin D 2000 IUs daily.  Reviewed importance of weightbearing and balance type exercise as able.  Schedule repeat colonoscopy, [facility] information given.  Estradiol 0.25 mg daily, reviewed best to stop, less is best we will try to decrease and stop this year.  Reviewed risks of blood clots, heart disease and breast cancer.  Shingrex and Pneumovax vaccines reviewed and encouraged declines at this time does plan to get the Covid vaccine.  Self-care, leisure activities encouraged.  Pap screening guidelines reviewed.    Huel Cote [facility], 3:32 PM 12/16/2019

## 2019-12-16 NOTE — Patient Instructions (Addendum)
Good to see you today shingrex and pneumonia vaccine [facility] [phone] Vit D 2000      Urinary Tract Infection, Adult A urinary tract infection (UTI) is an infection of any part of the urinary tract. The urinary tract includes:  The kidneys.  The ureters.  The bladder.  The urethra. These organs make, store, and get rid of pee (urine) in the body. What are the causes? This is caused by germs (bacteria) in your genital area. These germs grow and cause swelling (inflammation) of your urinary tract. What increases the risk? You are more likely to develop this condition if:  You have a small, thin tube (catheter) to drain pee.  You cannot control when you pee or poop (incontinence).  You are female, and: ? You use these methods to prevent pregnancy:  A medicine that kills sperm (spermicide).  A device that blocks sperm (diaphragm). ? You have low levels of a female hormone (estrogen). ? You are pregnant.  You have genes that add to your risk.  You are sexually active.  You take antibiotic medicines.  You have trouble peeing because of: ? A prostate that is bigger than normal, if you are female. ? A blockage in the part of your body that drains pee from the bladder (urethra). ? A kidney stone. ? A nerve condition that affects your bladder (neurogenic bladder). ? Not getting enough to drink. ? Not peeing often enough.  You have other conditions, such as: ? Diabetes. ? A weak disease-fighting system (immune system). ? Sickle cell disease. ? Gout. ? Injury of the spine. What are the signs or symptoms? Symptoms of this condition include:  Needing to pee right away (urgently).  Peeing often.  Peeing small amounts often.  Pain or burning when peeing.  Blood in the pee.  Pee that smells bad or not like normal.  Trouble peeing.  Pee that is cloudy.  Fluid coming from the vagina, if you are female.  Pain in the belly or lower back. Other symptoms  include:  Throwing up (vomiting).  No urge to eat.  Feeling mixed up (confused).  Being tired and grouchy (irritable).  A fever.  Watery poop (diarrhea). How is this treated? This condition may be treated with:  Antibiotic medicine.  Other medicines.  Drinking enough water. Follow these instructions at home:  Medicines  Take over-the-counter and prescription medicines only as told by your doctor.  If you were prescribed an antibiotic medicine, take it as told by your doctor. Do not stop taking it even if you start to feel better. General instructions  Make sure you: ? Pee until your bladder is empty. ? Do not hold pee for a long time. ? Empty your bladder after sex. ? Wipe from front to back after pooping if you are a female. Use each tissue one time when you wipe.  Drink enough fluid to keep your pee pale yellow.  Keep all follow-up visits as told by your doctor. This is important. Contact a doctor if:  You do not get better after 1-2 days.  Your symptoms go away and then come back. Get help right away if:  You have very bad back pain.  You have very bad pain in your lower belly.  You have a fever.  You are sick to your stomach (nauseous).  You are throwing up. Summary  A urinary tract infection (UTI) is an infection of any part of the urinary tract.  This condition is caused by  germs in your genital area.  There are many risk factors for a UTI. These include having a small, thin tube to drain pee and not being able to control when you pee or poop.  Treatment includes antibiotic medicines for germs.  Drink enough fluid to keep your pee pale yellow. This information is not intended to replace advice given to you by your health care provider. Make sure you discuss any questions you have with your health care provider. Document Revised: 10/16/2018 Document Reviewed: 05/08/2018 Elsevier Patient Education  [place] Maintenance After  Age 47 After age 82, you are at a higher risk for certain long-term diseases and infections as well as injuries from falls. Falls are a major cause of broken bones and head injuries in people who are older than age 85. Getting regular preventive care can help to keep you healthy and well. Preventive care includes getting regular testing and making lifestyle changes as recommended by your health care provider. Talk with your health care provider about:  Which screenings and tests you should have. A screening is a test that checks for a disease when you have no symptoms.  A diet and exercise plan that is right for you. What should I know about screenings and tests to prevent falls? Screening and testing are the best ways to find a health problem early. Early diagnosis and treatment give you the best chance of managing medical conditions that are common after age 52. Certain conditions and lifestyle choices may make you more likely to have a fall. Your health care provider may recommend:  Regular vision checks. Poor vision and conditions such as cataracts can make you more likely to have a fall. If you wear glasses, make sure to get your prescription updated if your vision changes.  Medicine review. Work with your health care provider to regularly review all of the medicines you are taking, including over-the-counter medicines. Ask your health care provider about any side effects that may make you more likely to have a fall. Tell your health care provider if any medicines that you take make you feel dizzy or sleepy.  Osteoporosis screening. Osteoporosis is a condition that causes the bones to get weaker. This can make the bones weak and cause them to break more easily.  Blood pressure screening. Blood pressure changes and medicines to control blood pressure can make you feel dizzy.  Strength and balance checks. Your health care provider may recommend certain tests to check your strength and balance  while standing, walking, or changing positions.  Foot health exam. Foot pain and numbness, as well as not wearing proper footwear, can make you more likely to have a fall.  Depression screening. You may be more likely to have a fall if you have a fear of falling, feel emotionally low, or feel unable to do activities that you used to do.  Alcohol use screening. Using too much alcohol can affect your balance and may make you more likely to have a fall. What actions can I take to lower my risk of falls? General instructions  Talk with your health care provider about your risks for falling. Tell your health care provider if: ? You fall. Be sure to tell your health care provider about all falls, even ones that seem minor. ? You feel dizzy, sleepy, or off-balance.  Take over-the-counter and prescription medicines only as told by your health care provider. These include any supplements.  Eat a healthy diet and maintain a  healthy weight. A healthy diet includes low-fat dairy products, low-fat (lean) meats, and fiber from whole grains, beans, and lots of fruits and vegetables. Home safety  Remove any tripping hazards, such as rugs, cords, and clutter.  Install safety equipment such as grab bars in bathrooms and safety rails on stairs.  Keep rooms and walkways well-lit. Activity   Follow a regular exercise program to stay fit. This will help you maintain your balance. Ask your health care provider what types of exercise are appropriate for you.  If you need a cane or walker, use it as recommended by your health care provider.  Wear supportive shoes that have nonskid soles. Lifestyle  Do not drink alcohol if your health care provider tells you not to drink.  If you drink alcohol, limit how much you have: ? 0-1 drink a day for women. ? 0-2 drinks a day for men.  Be aware of how much alcohol is in your drink. In the [place] one drink equals one typical bottle of beer (12 oz), one-half glass  of wine (5 oz), or one shot of hard liquor (1 oz).  Do not use any products that contain nicotine or tobacco, such as cigarettes and e-cigarettes. If you need help quitting, ask your health care provider. Summary  Having a healthy lifestyle and getting preventive care can help to protect your health and wellness after age 23.  Screening and testing are the best way to find a health problem early and help you avoid having a fall. Early diagnosis and treatment give you the best chance for managing medical conditions that are more common for people who are older than age 18.  Falls are a major cause of broken bones and head injuries in people who are older than age 28. Take precautions to prevent a fall at home.  Work with your health care provider to learn what changes you can make to improve your health and wellness and to prevent falls. This information is not intended to replace advice given to you by your health care provider. Make sure you discuss any questions you have with your health care provider. Document Revised: 02/19/2019 Document Reviewed: 09/11/2017 Elsevier Patient Education  2020 [employer].

## 2019-12-18 LAB — URINALYSIS, COMPLETE W/RFL CULTURE
Bilirubin Urine: NEGATIVE
Glucose, UA: NEGATIVE
Hgb urine dipstick: NEGATIVE
Hyaline Cast: NONE SEEN /LPF
Nitrites, Initial: POSITIVE — AB
RBC / HPF: NONE SEEN /HPF (ref 0–2)
Specific Gravity, Urine: 1.025 (ref 1.001–1.03)
pH: 5 (ref 5.0–8.0)

## 2019-12-18 LAB — URINE CULTURE
MICRO NUMBER:: 10111549
SPECIMEN QUALITY:: ADEQUATE

## 2019-12-18 LAB — CULTURE INDICATED

## 2020-01-18 ENCOUNTER — Ambulatory Visit: Payer: PPO | Attending: Internal Medicine

## 2020-01-18 DIAGNOSIS — Z23 Encounter for immunization: Secondary | ICD-10-CM | POA: Insufficient documentation

## 2020-01-18 NOTE — Progress Notes (Signed)
   Covid-19 Vaccination Clinic  Name:  Robin Arellano    MRN: [id number] DOB: Aug 27, 1951  01/18/2020  Robin Arellano was observed post Covid-19 immunization for 15 minutes without incident. She was provided with Vaccine Information Sheet and instruction to access the V-Safe system.   Robin Arellano was instructed to call 911 with any severe reactions post vaccine: Marland Kitchen Difficulty breathing  . Swelling of face and throat  . A fast heartbeat  . A bad rash all over body  . Dizziness and weakness   Immunizations Administered    Name Date Dose VIS Date Route   Pfizer COVID-19 Vaccine 01/18/2020  4:39 PM 0.3 mL 10/23/2019 Intramuscular   Manufacturer: Icard   Lot: [id number]   [place]: [id number]

## 2020-01-27 DIAGNOSIS — M5416 Radiculopathy, lumbar region: Secondary | ICD-10-CM | POA: Diagnosis not present

## 2020-01-27 DIAGNOSIS — M4316 Spondylolisthesis, lumbar region: Secondary | ICD-10-CM | POA: Diagnosis not present

## 2020-01-27 DIAGNOSIS — M4317 Spondylolisthesis, lumbosacral region: Secondary | ICD-10-CM | POA: Diagnosis not present

## 2020-01-27 DIAGNOSIS — M545 Low back pain: Secondary | ICD-10-CM | POA: Diagnosis not present

## 2020-02-03 DIAGNOSIS — R519 Headache, unspecified: Secondary | ICD-10-CM | POA: Diagnosis not present

## 2020-02-03 DIAGNOSIS — M545 Low back pain: Secondary | ICD-10-CM | POA: Diagnosis not present

## 2020-02-03 DIAGNOSIS — M5416 Radiculopathy, lumbar region: Secondary | ICD-10-CM | POA: Diagnosis not present

## 2020-02-03 DIAGNOSIS — M542 Cervicalgia: Secondary | ICD-10-CM | POA: Diagnosis not present

## 2020-02-15 ENCOUNTER — Other Ambulatory Visit: Payer: Self-pay | Admitting: Cardiovascular Disease

## 2020-02-15 ENCOUNTER — Ambulatory Visit: Payer: PPO | Attending: Internal Medicine

## 2020-02-15 DIAGNOSIS — Z23 Encounter for immunization: Secondary | ICD-10-CM

## 2020-02-15 NOTE — Progress Notes (Signed)
   Covid-19 Vaccination Clinic  Name:  Robin Arellano    MRN: [id number] DOB: February 05, 1951  02/15/2020  [name] was observed post Covid-19 immunization for 15 minutes without incident. She was provided with Vaccine Information Sheet and instruction to access the V-Safe system.   [name] was instructed to call 911 with any severe reactions post vaccine: Marland Kitchen Difficulty breathing  . Swelling of face and throat  . A fast heartbeat  . A bad rash all over body  . Dizziness and weakness   Immunizations Administered    Name Date Dose VIS Date Route   Pfizer COVID-19 Vaccine 02/15/2020 11:49 AM 0.3 mL 10/23/2019 Intramuscular   Manufacturer: [place]   Lot: [id number]   [place]: [id number]

## 2020-02-16 ENCOUNTER — Encounter: Payer: Self-pay | Admitting: Cardiovascular Disease

## 2020-02-16 ENCOUNTER — Telehealth (INDEPENDENT_AMBULATORY_CARE_PROVIDER_SITE_OTHER): Payer: PPO | Admitting: Cardiovascular Disease

## 2020-02-16 VITALS — BP 166/72 | HR 72 | Ht 62.0 in | Wt 165.0 lb

## 2020-02-16 DIAGNOSIS — E785 Hyperlipidemia, unspecified: Secondary | ICD-10-CM

## 2020-02-16 DIAGNOSIS — M5090 Cervical disc disorder, unspecified, unspecified cervical region: Secondary | ICD-10-CM

## 2020-02-16 DIAGNOSIS — I2510 Atherosclerotic heart disease of native coronary artery without angina pectoris: Secondary | ICD-10-CM

## 2020-02-16 DIAGNOSIS — Z79899 Other long term (current) drug therapy: Secondary | ICD-10-CM | POA: Diagnosis not present

## 2020-02-16 DIAGNOSIS — I10 Essential (primary) hypertension: Secondary | ICD-10-CM | POA: Diagnosis not present

## 2020-02-16 MED ORDER — EZETIMIBE 10 MG PO TABS: 10.0000 mg | ORAL_TABLET | Freq: Every day | ORAL | 3 refills | Status: DC

## 2020-02-16 MED ORDER — METOPROLOL SUCCINATE ER 25 MG PO TB24: 25.0000 mg | ORAL_TABLET | Freq: Every day | ORAL | 3 refills | Status: DC

## 2020-02-16 NOTE — Patient Instructions (Addendum)
Medication Instructions:  STOP TAKING YOUR METOPROLOL TARTRATE   BEGIN TAKING METOPROLOL SUCCINATE 25MG  DAILY  Refill for Zetia sent to your preferred pharmacy  *If you need a refill on your cardiac medications before your next appointment, please call your pharmacy*   Lab Work: 2-3 WEEKS FASTING LAB WORK: CBC CMET TSH LIPIDS  If you have labs (blood work) drawn today and your tests are completely normal, you will receive your results only by: Marland Kitchen MyChart Message (if you have MyChart) OR . A paper copy in the mail If you have any lab test that is abnormal or we need to change your treatment, we will call you to review the results.  Follow-Up: At [facility], you and your health needs are our priority.  As part of our continuing mission to provide you with exceptional heart care, we have created designated Provider Care Teams.  These Care Teams include your primary Cardiologist (physician) and Advanced Practice Providers (APPs -  Physician Assistants and Nurse Practitioners) who all work together to provide you with the care you need, when you need it.  We recommend signing up for the patient portal called "MyChart".  Sign up information is provided on this After Visit Summary.  MyChart is used to connect with patients for Virtual Visits (Telemedicine).  Patients are able to view lab/test results, encounter notes, upcoming appointments, etc.  Non-urgent messages can be sent to your provider as well.   To learn more about what you can do with MyChart, go to [email or web address].    Your next appointment:   3 month(s)  END OF JULY  The format for your next appointment:   In Person  Provider:   Shelva Majestic, MD

## 2020-02-16 NOTE — Progress Notes (Signed)
Virtual Visit via Telephone Note   This visit type was conducted due to national recommendations for restrictions regarding the COVID-19 Pandemic (e.g. social distancing) in an effort to limit this patient's exposure and mitigate transmission in our community.  Due to her co-morbid illnesses, this patient is at least at moderate risk for complications without adequate follow up.  This format is felt to be most appropriate for this patient at this time.  The patient did not have access to video technology/had technical difficulties with video requiring transitioning to audio format only (telephone).  All issues noted in this document were discussed and addressed.  No physical exam could be performed with this format.  Please refer to the patient's chart for her  consent to telehealth for [facility].   The patient was identified using 2 identifiers.  Date:  02/16/2020   ID:  Robin Arellano, Robin Arellano 1950/12/17, MRN [id number]  Patient Location: Home Provider Location: Home  PCP:  Sandi Mariscal, MD  Cardiologist:  Shelva Majestic, MD  Electrophysiologist:  None   Evaluation Performed:  Follow-Up Visit  Chief Complaint:  1 year F/U  History of Present Illness:    Robin Arellano is a 69 y.o. female who presented for 1 year follow-up cardiology evaluation.  I had seen  Robin Arellano over 25 years ago in early 1994 with atypical chest pain. On  September 22, 2018 she was  evaluated by  Dr. Aletta Edouard at [facility] ER with complaints of intermittent chest pain has been occurring off and on over the past year.  Prior to that evaluation, she was evaluated by her PCP and was told that she may have had an old heart attack based on her ECG.  During her ER evaluation she was significantly hypertensive with a blood pressure 187/100.  A chest x-ray did not show any acute abnormalities.  She had experienced some transient left facial numbness for 5 days associated with some dizziness and a CT was done which revealed atrophy  with small vessel chronic ischemic changes of deep cerebral white matter without acute intracranial abnormalities.  She was evaluated at [facility] on [address] by Dr. Theressa Millard and she is felt to have prediabetes.  Lipid studies were increased with a total cholesterol of 319, triglycerides 209, LDL cholesterol 224, and she was told to initiate Crestor 10 mg which she has not yet started.  Her sedimentation rate was elevated at 64.  ANA was negative.  RF was normal.  Thyroid studies were normal.  Because of her recent symptomatology, she was referred for cardiology evaluation.  When I saw her in November 2019 she complained of some intermittent chest discomfort which occurs in the center of her chest and seems to radiate to her left shoulder. It is aggravated when she turns a certain way and may last for hours and then ultimately resolves on its own.  She was told that this may be from "Iinside shingles."   She denied any exertional symptomatology.  Additional history is notable for chronic bilateral low back pain with bilateral sciatica.  She also  has fibromyalgia and osteoarthritis.  Has a history of anxiety and depression.  During my evaluation, I reviewed laboratory from her primary physician which suggested a high likelihood for familial hyperlipidemia with a total cholesterol at 319, LDL cholesterol at 224, triglycerides of 209, and HDL of 53.  With her recurrent chest pain which most likely had musculoskeletal features and her significant hyperlipidemia I recommended she undergo coronary CT  a to evaluate potential coronary atherosclerosis and recommended an echo Doppler evaluation particularly with her hypertensive history.  The echo Doppler study was done on October 14, 2018 and showed a normal LV function and normal LV strain with an EF of 60 to 65%.  There was mild aortic valve sclerosis without stenosis, mild MR, and a mildly thickened atrial septum consistent with lipomatous  hypertrophy.  She underwent coronary CTA on November 06, 2018 which showed moderate coronary plaque with moderate tubular atherosclerosis in the proximal RCA and mid RCA in the 50 to 69% range.  Left main coronary artery had mild plaque less than 25%.  Her LAD had mild proximal plaque in the 25 to 49% range, also had a moderate long segment of atherosclerotic plaque in the mid LAD just beyond the second diagonal vessel of 50 to 69%.  She was felt to have possible severe stenosis in the distal LAD with 70 to 99% plaque.  She had a small caliber ramus intermediate vessel.  The circumflex had mild to moderate plaque of 25 to 49% proximally, and then 50 to 69% stenosis.  Subsequent FFR analysis not reveal any significant flow-limiting stenosis with the exception of the distal LAD where the FFR was 0.57.   When I last saw her November 20, 2018 she denied any classic exertional chest pain.  Her chest pain has been recurring and is described as being sharp and knifelike.  Typically it is of short duration.  She also has recently started rosuvastatin 20 mg and has begun to notice some calf discomfort attributed to initiation of statin therapy.    With her abnormal coronary CT angiogram definitive cardiac catheterization was recommended.  She underwent this catheterization by me on November 25, 2018 which revealed mild multivessel nonobstructive CAD with narrowings no greater than 25% in the LAD, circumflex marginal, and dominant RCA.  Medical therapy was recommended with aggressive lipid-lowering therapy with target LDL less than 70.  It was felt that the patient's chest pain had atypical features and most likely was nonischemic.  After her catheterization she ultimately underwent neurologic evaluation and subsequent MRI of her cervical spine  showed mild multilevel degenerative spondylolisthesis in the cervical spine with associated widespread cervical facet arthropathy. There was superimposed cervical disc and  endplate degeneration maximal at C5 and 6 but no associated cervical spinal stenosis.  There was moderate neuroforaminal stenosis at the right C4 right C5 bilateral C6 and left C7 nerve levels.  He has been seeing Dr. Maryjean Ka for pain management and had undergone epidural injection.  She also sees Dr. Vertell Limber.  She denies any exertional chest pain.  She has continued to take rosuvastatin for hyperlipidemia.   Since my last evaluation, she underwent successful cervical disc surgery by Dr. Vertell Limber.  Prior to surgery she was having severe pain bilaterally in her arms.  This has essentially resolved.  She is now able to be more active.  Prior to her surgery she could not walk well due to significant pain.  She also tells me that she does have issues with her lower back and in August will be undergoing an MRI and may also be undergoing an injection.  If symptoms progress she may require future low back surgery.  At present, she denies any chest pain.  She apparently has only been taking metoprolol tartrate 25 mg once a day instead of as prescribed twice a day.  She has noticed some blood pressure elevation.  She denies any swelling.  She  denies any anginal symptoms.  During her recent injection her blood pressure did significantly increase.  She presents for evaluation.  The patient does not have symptoms concerning for COVID-19 infection (fever, chills, cough, or new shortness of breath).    Past Medical History:  Diagnosis Date  . ADHD   . Anxiety   . Back pain   . Chronic female pelvic pain   . Depression   . Fibromyalgia   . H/O leukocytosis   . Headache   . MI (myocardial infarction) ([place])    Pt states she did not have a MI- EKG was normal, was GERD  . Osteoarthritis   . Ovarian cyst, right   . Post-operative nausea and vomiting   . SVD (spontaneous vaginal delivery)    x 2  . Vitamin D deficiency    Past Surgical History:  Procedure Laterality Date  . ABDOMINAL HYSTERECTOMY  1994    [facility].[place]  . COLONOSCOPY  08/12/2017   Hx polyp/[place]  . KNEE SURGERY Bilateral 1996   x 2 - arthroscopic  . LEFT HEART CATH AND CORONARY ANGIOGRAPHY N/A 11/25/2018   Procedure: LEFT HEART CATH AND CORONARY ANGIOGRAPHY;  Surgeon: Troy Sine, MD;  Location: [facility];  Service: Cardiovascular;  Laterality: N/A;  . PELVIC LAPAROSCOPY  1989   W LYSIS OF ADHESIONS/L SALPINGONEOSTOMY  . WISDOM TOOTH EXTRACTION       Current Meds  Medication Sig  . ALPRAZolam (XANAX) 0.5 MG tablet Take 0.5 mg by mouth at bedtime.   Marland Kitchen amLODipine (NORVASC) 10 MG tablet Take 1 tablet (10 mg total) by mouth daily.  . Ascorbic Acid (VITAMIN C) 1000 MG tablet Take 1,000 mg by mouth daily.  Marland Kitchen aspirin EC 81 MG tablet Take 81 mg by mouth daily.  . butalbital-acetaminophen-caffeine (FIORICET, ESGIC) 50-325-40 MG tablet Take 1 tablet by mouth every 6 (six) hours as needed for headache.   . cholecalciferol (VITAMIN D) 1000 units tablet Take 1,000 Units by mouth daily.   Marland Kitchen estradiol (ESTRACE) 0.5 MG tablet Take 1/2 tablet daily and then stop when able  . ezetimibe (ZETIA) 10 MG tablet Take 1 tablet (10 mg total) by mouth daily.  Marland Kitchen HYDROcodone-acetaminophen (NORCO) 10-325 MG tablet Take 0.5 tablets by mouth daily as needed for moderate pain.   Marland Kitchen ibuprofen (ADVIL,MOTRIN) 200 MG tablet Take 200 mg by mouth daily.  . metoprolol tartrate (LOPRESSOR) 25 MG tablet Take 25 mg by mouth daily.   . rosuvastatin (CRESTOR) 10 MG tablet Take 0.5 tablets (5 mg total) by mouth daily.  . valACYclovir (VALTREX) 500 MG tablet TAKE 1 TABLET BY MOUTH TWICE DAILY FOR 3 TO 5 DAYS THEN TAKE DAILY AS NEEDED  . zolpidem (AMBIEN) 10 MG tablet Take 10 mg by mouth at bedtime.      Allergies:   Prednisone and Latex   Social History   Tobacco Use  . Smoking status: Former Smoker    Packs/day: 0.15    Years: 20.00    Pack years: 3.00    Types: Cigarettes    Quit date: 11/12/1998    Years since quitting: 21.2    . Smokeless tobacco: Never Used  Substance Use Topics  . Alcohol use: No  . Drug use: No     Socially she is married and has 2 children and 2 grandchildren.  She is retired.  She quit tobacco 2002.  Family Hx: The patient's family history includes Aneurysm in her father; COPD in  her brother; Cancer in her mother; Heart disease in her paternal grandfather; Other in her sister and sister; Skin cancer in her brother and brother. There is no history of Colon cancer, Rectal cancer, Stomach cancer, Stroke, or Neuropathy.   Her mother died at age 40.  Her father died at age 60 and had blood clots.  A brother died at age 63 and was exposed to agent orange.  She has a brother age 35 with cancer.  She has 5 sisters.  ROS:   Please see the history of present illness.    No fevers chills or night sweats. No cough No wheezing Previous upper back arm discomfort markedly improved following successful cervical surgery by Dr. Vertell Limber. Continued low back discomfort No palpitations No anginal symptoms No abdominal pain No swelling Sleeping well All other systems reviewed and are negative.   Prior CV studies:   The following studies were reviewed today:  10/14/2018 ECHO Study Conclusions  - Left ventricle: Average global longitudinal LV strain is normal at -18.7% The cavity size was normal. Systolic function was normal. The estimated ejection fraction was in the range of 60% to 65%. Wall motion was normal; there were no regional wall motion abnormalities. The study is not technically sufficient to allow evaluation of LV diastolic function. - Aortic valve: Trileaflet; mildly thickened, mildly calcified leaflets. - Mitral valve: There was mild regurgitation. - Atrial septum: There was increased thickness of the septum, consistent with lipomatous hypertrophy.   CARDIAC CATH: 11/25/2018  Prox RCA lesion is 20% stenosed.  Mid RCA lesion is 20% stenosed.  Ost 1st Mrg lesion is  25% stenosed.  Prox LAD lesion is 25% stenosed.  Mid LAD lesion is 20% stenosed.  Dist LAD lesion is 20% stenosed.  Mid LM lesion is 5% stenosed.   Mild multi-vessel nonobstructive CAD with narrowings no greater than 25% in the LAD, circumflex marginal, and dominant  RCA.  LVEDP 10 mm Hg.  RECOMMENDATION: Medical therapy.  Aggressive lipid-lowering therapy with target LDL less than 70.  The patient's chest pain has atypical features and is most likely nonischemic.   Labs/Other Tests and Data Reviewed:    EKG:  An ECG dated 01/14/2019 was personally reviewed today and demonstrated:  Sinus bradycardia at 59, normal intervals, no ectopy   November 20, 2018 EKG:  EKG is ordered today. Sinus Bradycardia at 58;Q wave III, no STT changes  October 06, 2018 ECG (independently read by me): Normal sinus rhythm at 66 bpm.  Nondiagnostic Q waves in lead III and aVF.  Normal intervals.  No ectopy.  No ST segment changes.  Recent Labs: No results found for requested labs within last 8760 hours.   Recent Lipid Panel Lab Results  Component Value Date/Time   CHOL 146 11/21/2018 10:07 AM   TRIG 132 11/21/2018 10:07 AM   HDL 59 11/21/2018 10:07 AM   CHOLHDL 2.5 11/21/2018 10:07 AM   LDLCALC 61 11/21/2018 10:07 AM    Wt Readings from Last 3 Encounters:  02/16/20 165 lb (74.8 kg)  12/16/19 171 lb (77.6 kg)  01/14/19 174 lb 3.2 oz (79 kg)     Objective:    Vital Signs:  BP (!) 166/72   Pulse 72   Ht 5\' 2"  (1.575 m)   Wt 165 lb (74.8 kg)   LMP  (LMP Unknown)   BMI 30.18 kg/m    Since this was a virtual visit I could not physically examine the patient. Her breathing was normal and not  labored. There was no audible wheezing Self palpation of her pulse revealed a regular rhythm She did not have chest wall pain No abdominal tenderness No claudication symptoms No edema Normal affect and mood  ASSESSMENT & PLAN:    1. Mild nonobstructive CAD: Cath data again reviewed with  patient.  Mild multivessel nonobstructive plaque 25% or less.  Continue aggressive lifestyle management and lipid intervention to induce plaque regression and prevent progression. 2. Essential hypertension: She continues to be on amlodipine 10 mg and is supposed to be on metoprolol tartrate 25 mg daily but she has only been taking this once per day.  She has had some blood pressure lability.  I am changing this to metoprolol succinate initially at 25 mg but her dose may need to be further titrated to 50 mg depending upon response. 3. Hyperlipidemia: Target LDL less than 70.  She continues to be on rosuvastatin 5 mg 4. Degenerative disc disease: Status post cervical surgery.  Has lumbar issues and has undergone epidural injections.  She will be undergoing subsequent MRI imaging and another injection but if continued symptoms develop may require lumbar surgery.  COVID-19 Education: The signs and symptoms of COVID-19 were discussed with the patient and how to seek care for testing (follow up with PCP or arrange E-visit).  The importance of social distancing was discussed today.  Time:   Today, I have spent 23 minutes with the patient with telehealth technology discussing the above problems.     Medication Adjustments/Labs and Tests Ordered: Current medicines are reviewed at length with the patient today.  Concerns regarding medicines are outlined above.   Tests Ordered: No orders of the defined types were placed in this encounter.   Medication Changes: No orders of the defined types were placed in this encounter.   Follow Up: Fasting laboratory will be obtained.  With her need for possible lumbar surgery in August, we will plan to see in July or early August for follow-up evaluation.  Signed, Shelva Majestic, MD  02/16/2020 1:26 PM    [facility]

## 2020-02-29 ENCOUNTER — Telehealth: Payer: Self-pay | Admitting: Cardiovascular Disease

## 2020-02-29 NOTE — Telephone Encounter (Signed)
Left message for patient to call and schedule follow up with Dr. Claiborne Billings at the end of July 2021

## 2020-03-01 ENCOUNTER — Other Ambulatory Visit: Payer: Self-pay | Admitting: Cardiovascular Disease

## 2020-03-03 NOTE — Telephone Encounter (Signed)
Left message for patient to call and schedule July appt with Dr. Claiborne Billings

## 2020-03-08 DIAGNOSIS — Z79899 Other long term (current) drug therapy: Secondary | ICD-10-CM | POA: Diagnosis not present

## 2020-03-08 DIAGNOSIS — I2510 Atherosclerotic heart disease of native coronary artery without angina pectoris: Secondary | ICD-10-CM | POA: Diagnosis not present

## 2020-03-08 DIAGNOSIS — E785 Hyperlipidemia, unspecified: Secondary | ICD-10-CM | POA: Diagnosis not present

## 2020-03-08 DIAGNOSIS — I10 Essential (primary) hypertension: Secondary | ICD-10-CM | POA: Diagnosis not present

## 2020-03-09 LAB — COMPREHENSIVE METABOLIC PANEL
ALT: 6 IU/L (ref 0–32)
AST: 13 IU/L (ref 0–40)
Albumin/Globulin Ratio: 1.6 (ref 1.2–2.2)
Albumin: 4.1 g/dL (ref 3.8–4.8)
Alkaline Phosphatase: 102 IU/L (ref 39–117)
BUN/Creatinine Ratio: 13 (ref 12–28)
BUN: 13 mg/dL (ref 8–27)
Bilirubin Total: 0.2 mg/dL (ref 0.0–1.2)
CO2: 24 mmol/L (ref 20–29)
Calcium: 9.6 mg/dL (ref 8.7–10.3)
Chloride: 100 mmol/L (ref 96–106)
Creatinine, Ser: 1.04 mg/dL — ABNORMAL HIGH (ref 0.57–1.00)
GFR calc Af Amer: 63 mL/min/{1.73_m2} (ref >59)
GFR calc non Af Amer: 55 mL/min/{1.73_m2} — ABNORMAL LOW (ref >59)
Globulin, Total: 2.6 g/dL (ref 1.5–4.5)
Glucose: 86 mg/dL (ref 65–99)
Potassium: 4.5 mmol/L (ref 3.5–5.2)
Sodium: 138 mmol/L (ref 134–144)
Total Protein: 6.7 g/dL (ref 6.0–8.5)

## 2020-03-09 LAB — LIPID PANEL
Chol/HDL Ratio: 3.2 ratio (ref 0.0–4.4)
Cholesterol, Total: 155 mg/dL (ref 100–199)
HDL: 49 mg/dL (ref >39)
LDL Chol Calc (NIH): 82 mg/dL (ref 0–99)
Triglycerides: 138 mg/dL (ref 0–149)
VLDL Cholesterol Cal: 24 mg/dL (ref 5–40)

## 2020-03-09 LAB — CBC
Hematocrit: 35 % (ref 34.0–46.6)
Hemoglobin: 11.9 g/dL (ref 11.1–15.9)
MCH: 32.7 pg (ref 26.6–33.0)
MCHC: 34 g/dL (ref 31.5–35.7)
MCV: 96 fL (ref 79–97)
Platelets: 353 10*3/uL (ref 150–450)
RBC: 3.64 x10E6/uL — ABNORMAL LOW (ref 3.77–5.28)
RDW: 13.1 % (ref 11.7–15.4)
WBC: 13.3 10*3/uL — ABNORMAL HIGH (ref 3.4–10.8)

## 2020-03-09 LAB — TSH: TSH: 1.6 u[IU]/mL (ref 0.450–4.500)

## 2020-03-31 ENCOUNTER — Emergency Department (HOSPITAL_COMMUNITY): Payer: PPO

## 2020-03-31 ENCOUNTER — Other Ambulatory Visit: Payer: Self-pay

## 2020-03-31 ENCOUNTER — Encounter (HOSPITAL_COMMUNITY): Payer: Self-pay

## 2020-03-31 ENCOUNTER — Inpatient Hospital Stay (HOSPITAL_COMMUNITY)
Admission: EM | Admit: 2020-03-31 | Discharge: 2020-04-08 | DRG: 439 | Disposition: A | Discharge status: Home / Self Care | Payer: PPO | Attending: Internal Medicine | Admitting: Internal Medicine

## 2020-03-31 DIAGNOSIS — R778 Other specified abnormalities of plasma proteins: Secondary | ICD-10-CM | POA: Diagnosis present

## 2020-03-31 DIAGNOSIS — D539 Nutritional anemia, unspecified: Secondary | ICD-10-CM | POA: Diagnosis present

## 2020-03-31 DIAGNOSIS — Z888 Allergy status to other drugs, medicaments and biological substances status: Secondary | ICD-10-CM

## 2020-03-31 DIAGNOSIS — Z91040 Latex allergy status: Secondary | ICD-10-CM

## 2020-03-31 DIAGNOSIS — R7989 Other specified abnormal findings of blood chemistry: Secondary | ICD-10-CM | POA: Diagnosis present

## 2020-03-31 DIAGNOSIS — F419 Anxiety disorder, unspecified: Secondary | ICD-10-CM | POA: Diagnosis present

## 2020-03-31 DIAGNOSIS — R1084 Generalized abdominal pain: Secondary | ICD-10-CM | POA: Diagnosis not present

## 2020-03-31 DIAGNOSIS — Z79891 Long term (current) use of opiate analgesic: Secondary | ICD-10-CM

## 2020-03-31 DIAGNOSIS — M797 Fibromyalgia: Secondary | ICD-10-CM | POA: Diagnosis not present

## 2020-03-31 DIAGNOSIS — N6310 Unspecified lump in the right breast, unspecified quadrant: Secondary | ICD-10-CM | POA: Diagnosis present

## 2020-03-31 DIAGNOSIS — D649 Anemia, unspecified: Secondary | ICD-10-CM | POA: Diagnosis not present

## 2020-03-31 DIAGNOSIS — K828 Other specified diseases of gallbladder: Secondary | ICD-10-CM | POA: Diagnosis not present

## 2020-03-31 DIAGNOSIS — Z79899 Other long term (current) drug therapy: Secondary | ICD-10-CM

## 2020-03-31 DIAGNOSIS — Z86010 Personal history of colonic polyps: Secondary | ICD-10-CM

## 2020-03-31 DIAGNOSIS — B029 Zoster without complications: Secondary | ICD-10-CM | POA: Diagnosis present

## 2020-03-31 DIAGNOSIS — K838 Other specified diseases of biliary tract: Secondary | ICD-10-CM | POA: Diagnosis not present

## 2020-03-31 DIAGNOSIS — I160 Hypertensive urgency: Secondary | ICD-10-CM | POA: Diagnosis not present

## 2020-03-31 DIAGNOSIS — M542 Cervicalgia: Secondary | ICD-10-CM | POA: Diagnosis present

## 2020-03-31 DIAGNOSIS — N649 Disorder of breast, unspecified: Secondary | ICD-10-CM | POA: Diagnosis not present

## 2020-03-31 DIAGNOSIS — F909 Attention-deficit hyperactivity disorder, unspecified type: Secondary | ICD-10-CM | POA: Diagnosis not present

## 2020-03-31 DIAGNOSIS — K851 Biliary acute pancreatitis without necrosis or infection: Secondary | ICD-10-CM | POA: Diagnosis not present

## 2020-03-31 DIAGNOSIS — D509 Iron deficiency anemia, unspecified: Secondary | ICD-10-CM | POA: Diagnosis not present

## 2020-03-31 DIAGNOSIS — E876 Hypokalemia: Secondary | ICD-10-CM | POA: Diagnosis present

## 2020-03-31 DIAGNOSIS — I2510 Atherosclerotic heart disease of native coronary artery without angina pectoris: Secondary | ICD-10-CM | POA: Diagnosis not present

## 2020-03-31 DIAGNOSIS — N63 Unspecified lump in unspecified breast: Secondary | ICD-10-CM | POA: Diagnosis present

## 2020-03-31 DIAGNOSIS — Z87891 Personal history of nicotine dependence: Secondary | ICD-10-CM

## 2020-03-31 DIAGNOSIS — Z20822 Contact with and (suspected) exposure to covid-19: Secondary | ICD-10-CM | POA: Diagnosis present

## 2020-03-31 DIAGNOSIS — N289 Disorder of kidney and ureter, unspecified: Secondary | ICD-10-CM | POA: Diagnosis not present

## 2020-03-31 DIAGNOSIS — E559 Vitamin D deficiency, unspecified: Secondary | ICD-10-CM | POA: Diagnosis present

## 2020-03-31 DIAGNOSIS — Z8249 Family history of ischemic heart disease and other diseases of the circulatory system: Secondary | ICD-10-CM

## 2020-03-31 DIAGNOSIS — R945 Abnormal results of liver function studies: Secondary | ICD-10-CM | POA: Diagnosis not present

## 2020-03-31 DIAGNOSIS — F329 Major depressive disorder, single episode, unspecified: Secondary | ICD-10-CM | POA: Diagnosis not present

## 2020-03-31 DIAGNOSIS — I10 Essential (primary) hypertension: Secondary | ICD-10-CM | POA: Diagnosis present

## 2020-03-31 DIAGNOSIS — N390 Urinary tract infection, site not specified: Secondary | ICD-10-CM | POA: Diagnosis not present

## 2020-03-31 DIAGNOSIS — B9620 Unspecified Escherichia coli [E. coli] as the cause of diseases classified elsewhere: Secondary | ICD-10-CM | POA: Diagnosis present

## 2020-03-31 DIAGNOSIS — I252 Old myocardial infarction: Secondary | ICD-10-CM

## 2020-03-31 DIAGNOSIS — R102 Pelvic and perineal pain: Secondary | ICD-10-CM | POA: Diagnosis not present

## 2020-03-31 DIAGNOSIS — R935 Abnormal findings on diagnostic imaging of other abdominal regions, including retroperitoneum: Secondary | ICD-10-CM | POA: Diagnosis not present

## 2020-03-31 DIAGNOSIS — Z03818 Encounter for observation for suspected exposure to other biological agents ruled out: Secondary | ICD-10-CM | POA: Diagnosis not present

## 2020-03-31 DIAGNOSIS — R748 Abnormal levels of other serum enzymes: Secondary | ICD-10-CM | POA: Diagnosis not present

## 2020-03-31 DIAGNOSIS — K5790 Diverticulosis of intestine, part unspecified, without perforation or abscess without bleeding: Secondary | ICD-10-CM | POA: Diagnosis not present

## 2020-03-31 DIAGNOSIS — K859 Acute pancreatitis without necrosis or infection, unspecified: Principal | ICD-10-CM | POA: Diagnosis present

## 2020-03-31 DIAGNOSIS — N179 Acute kidney failure, unspecified: Secondary | ICD-10-CM | POA: Diagnosis not present

## 2020-03-31 DIAGNOSIS — G8929 Other chronic pain: Secondary | ICD-10-CM | POA: Diagnosis present

## 2020-03-31 DIAGNOSIS — R1013 Epigastric pain: Secondary | ICD-10-CM | POA: Diagnosis not present

## 2020-03-31 LAB — CBC
HCT: 36.4 % (ref 36.0–46.0)
Hemoglobin: 12.4 g/dL (ref 12.0–15.0)
MCH: 32.9 pg (ref 26.0–34.0)
MCHC: 34.1 g/dL (ref 30.0–36.0)
MCV: 96.6 fL (ref 80.0–100.0)
Platelets: 280 10*3/uL (ref 150–400)
RBC: 3.77 MIL/uL — ABNORMAL LOW (ref 3.87–5.11)
RDW: 13 % (ref 11.5–15.5)
WBC: 17.6 10*3/uL — ABNORMAL HIGH (ref 4.0–10.5)
nRBC: 0 % (ref 0.0–0.2)

## 2020-03-31 LAB — BASIC METABOLIC PANEL
Anion gap: 11 (ref 5–15)
BUN: 23 mg/dL (ref 8–23)
CO2: 24 mmol/L (ref 22–32)
Calcium: 9.5 mg/dL (ref 8.9–10.3)
Chloride: 103 mmol/L (ref 98–111)
Creatinine, Ser: 1.18 mg/dL — ABNORMAL HIGH (ref 0.44–1.00)
GFR calc Af Amer: 54 mL/min — ABNORMAL LOW (ref >60)
GFR calc non Af Amer: 47 mL/min — ABNORMAL LOW (ref >60)
Glucose, Bld: 136 mg/dL — ABNORMAL HIGH (ref 70–99)
Potassium: 2.9 mmol/L — ABNORMAL LOW (ref 3.5–5.1)
Sodium: 138 mmol/L (ref 135–145)

## 2020-03-31 LAB — LIPASE, BLOOD: Lipase: 5738 U/L — ABNORMAL HIGH (ref 11–51)

## 2020-03-31 LAB — TROPONIN I (HIGH SENSITIVITY)
Troponin I (High Sensitivity): 15 ng/L (ref <18)
Troponin I (High Sensitivity): 25 ng/L — ABNORMAL HIGH (ref <18)

## 2020-03-31 IMAGING — CT CT ABD-PELV W/ CM
2 of 5 series · 16 of 46 positions shown, 18 images · IV contrast (omnipaque)
Comparison: [DATE]

CLINICAL DATA: Pancreatitis. Elevated lipase.

EXAM:
CT ABDOMEN AND PELVIS WITH CONTRAST
TECHNIQUE: Multidetector CT imaging of the abdomen and pelvis was performed
using the standard protocol following bolus administration of
intravenous contrast.
CONTRAST:  80mL OMNIPAQUE IOHEXOL 300 MG/ML  SOLN

[Series 2: axial st · axial · 0.66mm/px · z∈[-584,-199]mm · 13 of 89 slices shown, 15 images]
[im 6/89  soft-tissue]
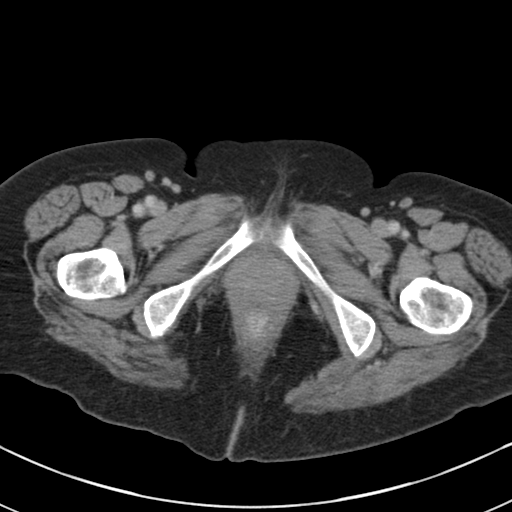
[im 6/89  bone]
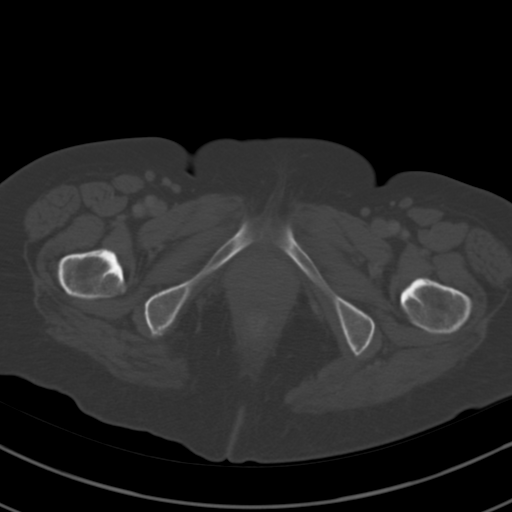
[im 12/89  soft-tissue]
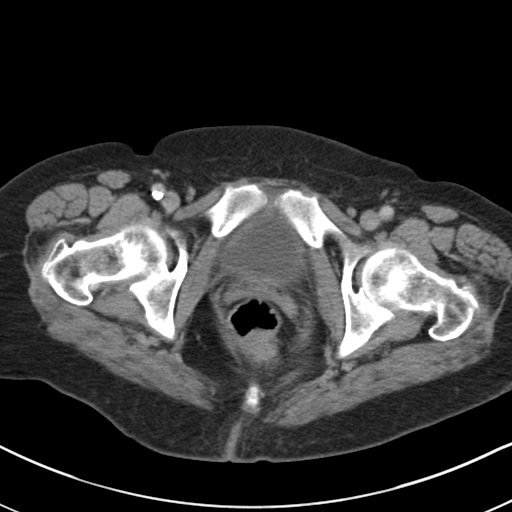
[im 17/89  soft-tissue]
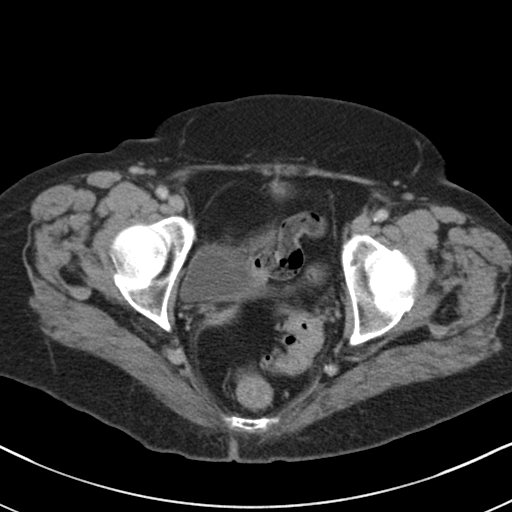
[im 28/89  soft-tissue]
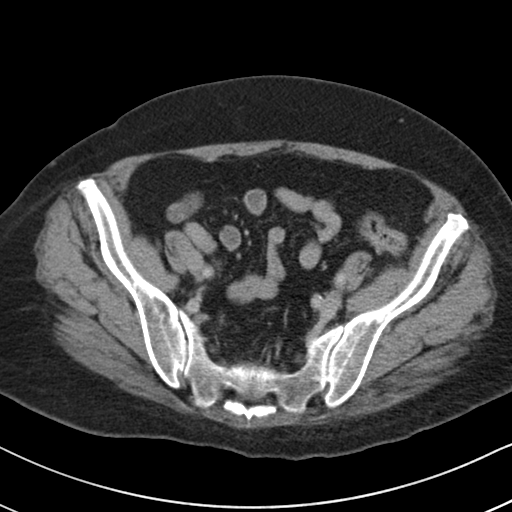
[im 34/89  soft-tissue]
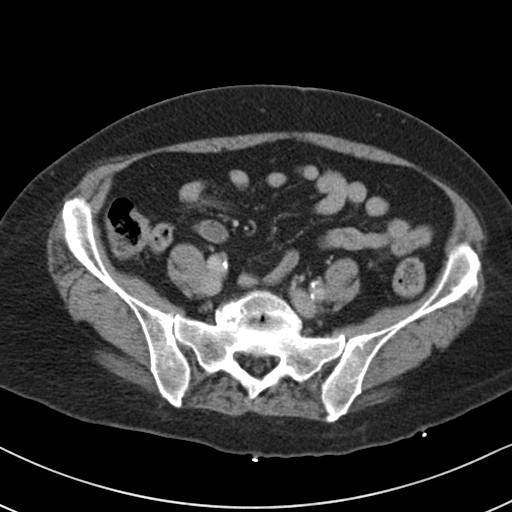
[im 39/89  soft-tissue]
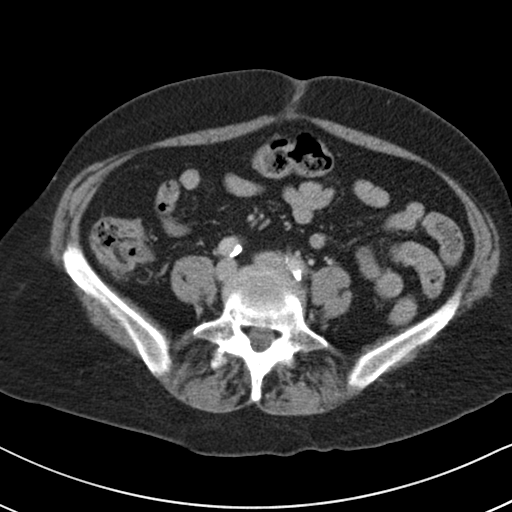
[im 45/89  soft-tissue]
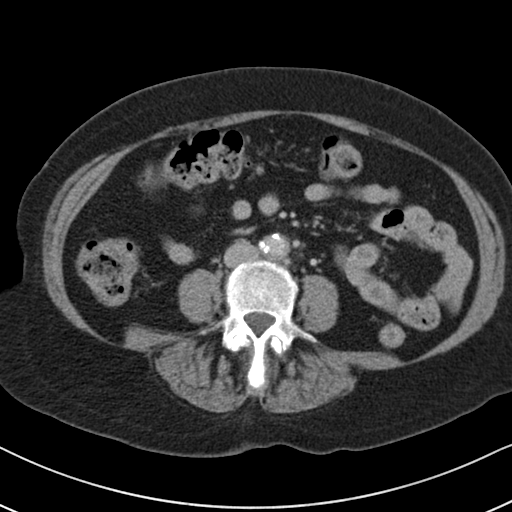
[im 50/89  soft-tissue]
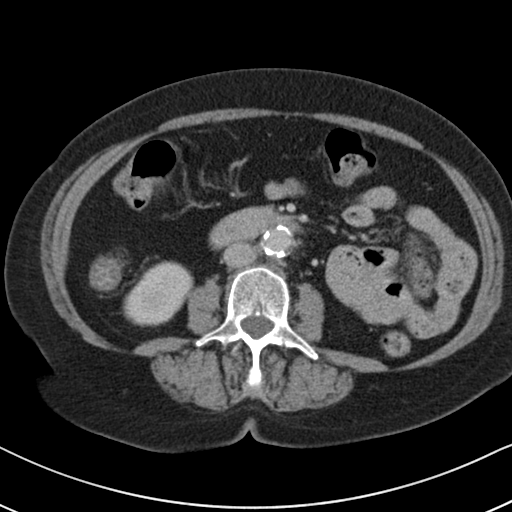
[im 56/89  soft-tissue]
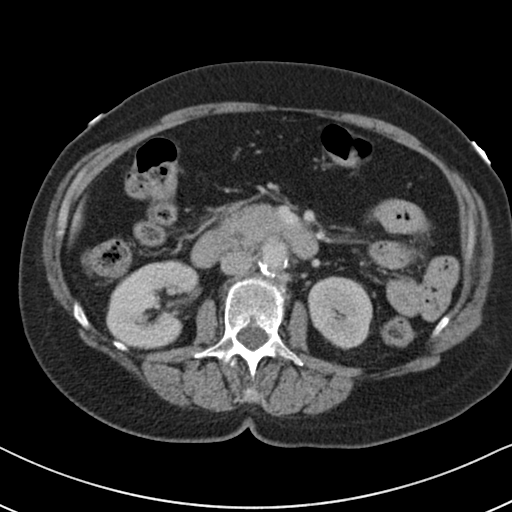
[im 56/89  bone]
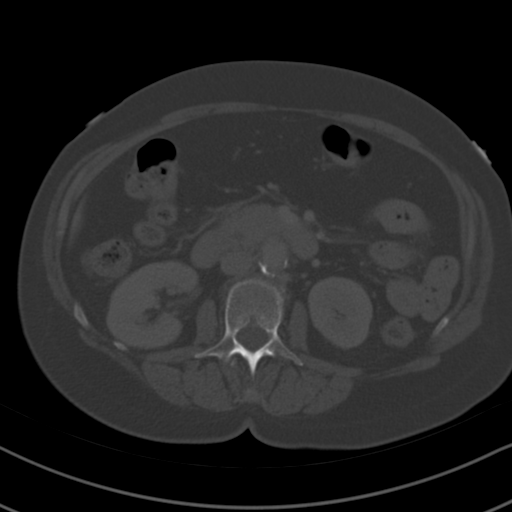
[im 61/89  soft-tissue]
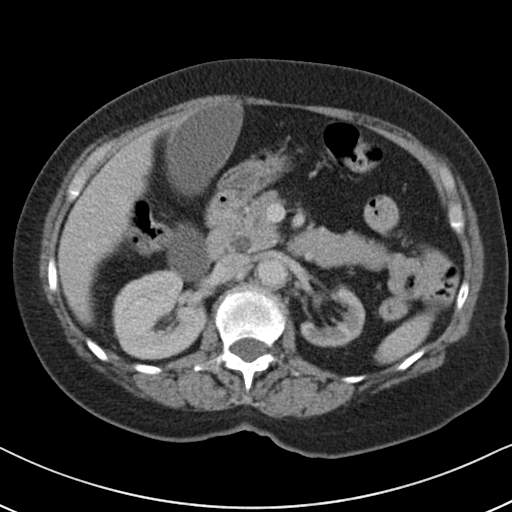
[im 72/89  soft-tissue]
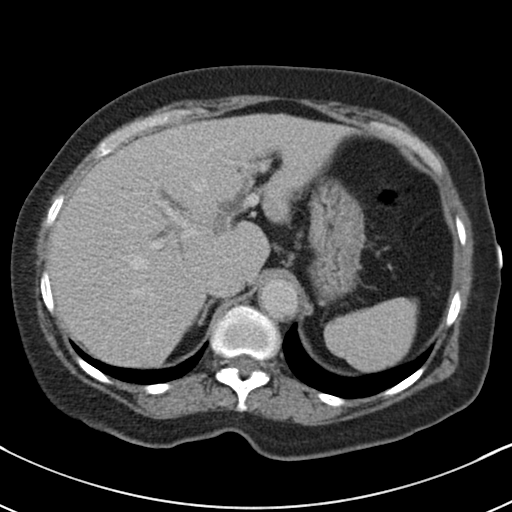
[im 78/89  soft-tissue]
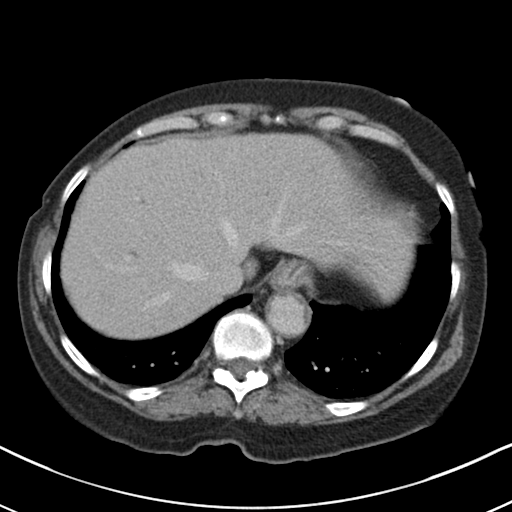
[im 83/89  soft-tissue]
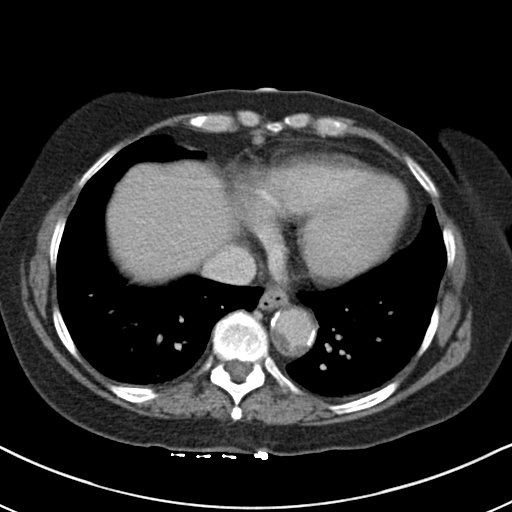

[Series 4: coronal st · coronal · 0.72mm/px · 3 of 131 slices shown]
[im 44/131  soft-tissue]
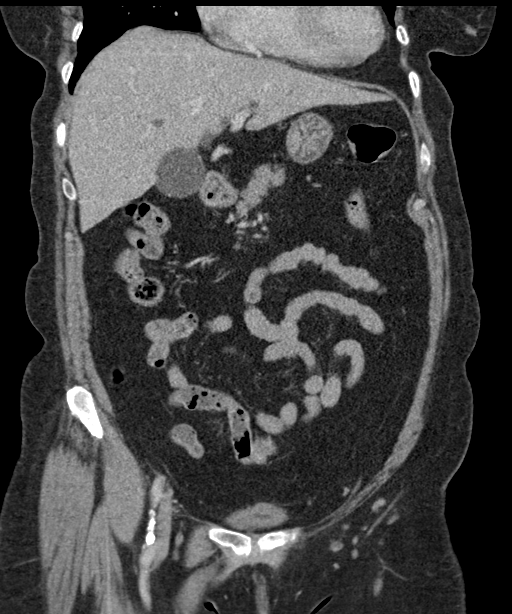
[im 58/131  soft-tissue]
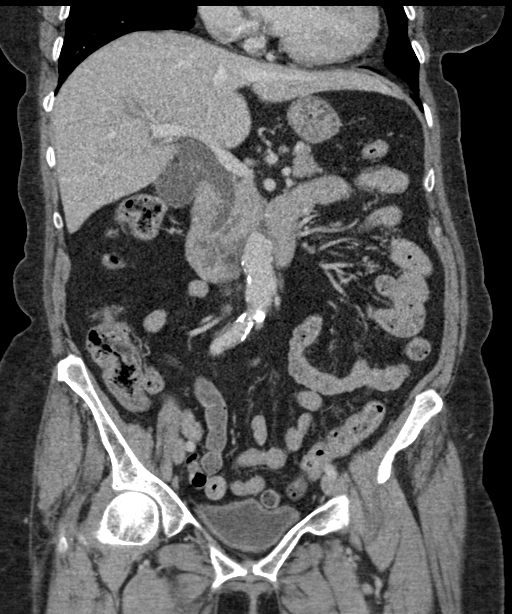
[im 73/131  soft-tissue]
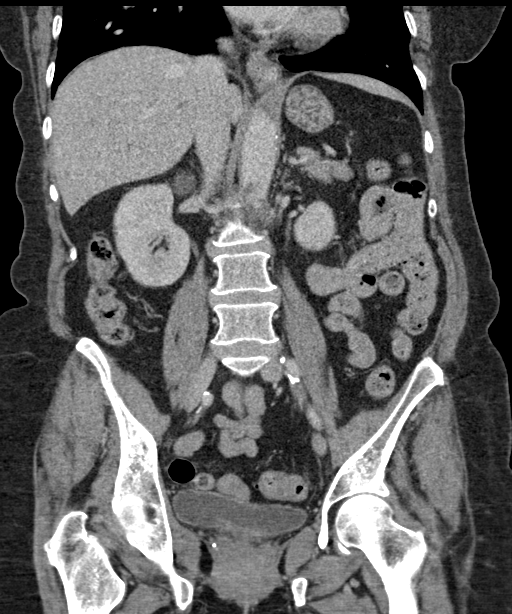

[16 of 46 positions shown; findings below may reference images not displayed]

FINDINGS: Lower chest: Lungs are clear. Heart size is mildly enlarged. There
is a right-sided breast nodule measuring approximately 1.8 cm
(increased in size from prior study in [02]).

Hepatobiliary: The liver is normal. The gallbladder is
distended.There is intrahepatic and extrahepatic biliary ductal
dilatation.

Pancreas: Pancreatic duct is mildly dilated distally. There are no
significant peripancreatic inflammatory changes.

Spleen: Unremarkable.

Adrenals/Urinary Tract:

--Adrenal glands: Unremarkable.

--Right kidney/ureter: No hydronephrosis or radiopaque kidney
stones.

--Left kidney/ureter: There is atrophy of the upper pole the left
kidney. There is no hydronephrosis.

--Urinary bladder: Unremarkable.

Stomach/Bowel:

--Stomach/Duodenum: No hiatal hernia or other gastric abnormality.
Normal duodenal course and caliber.

--Small bowel: Unremarkable.

--Colon: Rectosigmoid diverticulosis without acute inflammation.

--Appendix: Normal.

Vascular/Lymphatic: Atherosclerotic calcification is present within
the non-aneurysmal abdominal aorta, without hemodynamically
significant stenosis.

--No retroperitoneal lymphadenopathy.

--No mesenteric lymphadenopathy.

--No pelvic or inguinal lymphadenopathy.

Reproductive: Status post hysterectomy. No adnexal mass.

Other: No ascites or free air. The abdominal wall is normal.

Musculoskeletal. No acute displaced fractures.
IMPRESSION: 1. Distended gallbladder with intrahepatic and extrahepatic biliary
ductal dilatation. Pancreatic duct is mildly dilated distally. No
significant peripancreatic inflammatory changes. Correlation with
laboratory studies is recommended. Follow-up with MRCP/ERCP is
recommended.
2. Rectosigmoid diverticulosis without acute inflammation.
3. Right-sided breast nodule measuring approximately 1.8 cm
(increased in size from prior study in [02]). Correlation with
outpatient mammography is recommended.

Aortic Atherosclerosis ([02]-[02]).

## 2020-03-31 MED ORDER — SODIUM CHLORIDE 0.9% FLUSH
3.0000 mL | Freq: Once | INTRAVENOUS | Status: AC
  Administered 2020-04-01: 3 mL via INTRAVENOUS

## 2020-03-31 MED ORDER — ONDANSETRON HCL 4 MG/2ML IJ SOLN
4.0000 mg | Freq: Once | INTRAMUSCULAR | Status: AC
  Administered 2020-03-31: 4 mg via INTRAVENOUS
  Filled 2020-03-31: qty 2

## 2020-03-31 MED ORDER — MORPHINE SULFATE (PF) 4 MG/ML IV SOLN
4.0000 mg | Freq: Once | INTRAVENOUS | Status: AC
  Administered 2020-03-31: 4 mg via INTRAVENOUS
  Filled 2020-03-31: qty 1

## 2020-03-31 MED ORDER — IOHEXOL 300 MG/ML  SOLN
100.0000 mL | Freq: Once | INTRAMUSCULAR | Status: AC | PRN
  Administered 2020-03-31: 80 mL via INTRAVENOUS

## 2020-03-31 MED ORDER — SODIUM CHLORIDE (PF) 0.9 % IJ SOLN
INTRAMUSCULAR | Status: AC
  Filled 2020-03-31: qty 50

## 2020-03-31 MED ORDER — LORAZEPAM 2 MG/ML IJ SOLN
0.5000 mg | Freq: Once | INTRAMUSCULAR | Status: AC
  Administered 2020-03-31: 0.5 mg via INTRAVENOUS
  Filled 2020-03-31: qty 1

## 2020-03-31 MED ORDER — SODIUM CHLORIDE 0.9 % IV BOLUS
1000.0000 mL | Freq: Once | INTRAVENOUS | Status: AC
  Administered 2020-03-31: 1000 mL via INTRAVENOUS

## 2020-03-31 MED ORDER — HYDROMORPHONE HCL 1 MG/ML IJ SOLN
1.0000 mg | Freq: Once | INTRAMUSCULAR | Status: AC
  Administered 2020-03-31: 1 mg via INTRAVENOUS
  Filled 2020-03-31: qty 1

## 2020-03-31 NOTE — ED Provider Notes (Signed)
[facility] DEPT Provider Note   CSN: [id number] Arrival date & time: 03/31/20  1940     History Chief Complaint  Patient presents with  . Abdominal Pain    Robin Arellano is a 69 y.o. female.  The history is provided by the patient and medical records.    69 year old female with history of ADHD, anxiety, chronic back pain, depression, fibromyalgia, prior MI, ovarian cyst, resenting to the ED with abdominal pain.  She reports this initially began on Sunday and has been progressively worsening throughout the week.  Pain localized to her epigastrium and mid abdomen.  She reports continuous nausea and vomiting.  She is continued trying to drink fluids and eat small meals, however unable to hold anything down.  She denies any fever or chills.  No diarrhea.  No new medications.  No alcohol use.  No history of gallbladder disease.  Prior abdominal surgeries include appendectomy and hysterectomy.  Past Medical History:  Diagnosis Date  . ADHD   . Anxiety   . Back pain   . Chronic female pelvic pain   . Depression   . Fibromyalgia   . H/O leukocytosis   . Headache   . MI (myocardial infarction) ([place])    Pt states she did not have a MI- EKG was normal, was GERD  . Osteoarthritis   . Ovarian cyst, right   . Post-operative nausea and vomiting   . SVD (spontaneous vaginal delivery)    x 2  . Vitamin D deficiency     Patient Active Problem List   Diagnosis Date Noted  . Coronary artery calcification of native artery   . Chest pain   . History of colonic polyps 02/12/2018  . LUQ abdominal pain 02/07/2018  . Herpes zoster without complication [id number]  . Abdominal pain 02/07/2018  . Abdominal pain, epigastric 02/07/2018  . Low back pain 07/09/2013  . Neck pain 07/09/2013  . Headache(784.0) 07/09/2013  . Fibromyalgia 07/09/2013    Past Surgical History:  Procedure Laterality Date  . ABDOMINAL HYSTERECTOMY  1994   [facility]    . COLONOSCOPY  08/12/2017   Hx polyp/[place]  . KNEE SURGERY Bilateral 1996   x 2 - arthroscopic  . LEFT HEART CATH AND CORONARY ANGIOGRAPHY N/A 11/25/2018   Procedure: LEFT HEART CATH AND CORONARY ANGIOGRAPHY;  Surgeon: Troy Sine, MD;  Location: [facility];  Service: Cardiovascular;  Laterality: N/A;  . PELVIC LAPAROSCOPY  1989   W LYSIS OF ADHESIONS/L SALPINGONEOSTOMY  . WISDOM TOOTH EXTRACTION       OB History    Gravida  2   Para  2   Term      Preterm      AB      Living  2     SAB      TAB      Ectopic      Multiple      Live Births              Family History  Problem Relation Age of Onset  . Cancer Mother        UTERINE  . Aneurysm Father   . Heart disease Paternal Grandfather   . COPD Brother   . Skin cancer Brother   . Other Sister        MGUS   . Skin cancer Brother   . Other Sister        MA  .  Colon cancer Neg Hx   . Rectal cancer Neg Hx   . Stomach cancer Neg Hx   . Stroke Neg Hx   . Neuropathy Neg Hx     Social History   Tobacco Use  . Smoking status: Former Smoker    Packs/day: 0.15    Years: 20.00    Pack years: 3.00    Types: Cigarettes    Quit date: 11/12/1998    Years since quitting: 21.3  . Smokeless tobacco: Never Used  Substance Use Topics  . Alcohol use: No  . Drug use: No    Home Medications Prior to Admission medications   Medication Sig Start Date End Date Taking? Authorizing Provider  ALPRAZolam Duanne Moron) 0.5 MG tablet Take 0.5 mg by mouth at bedtime.  01/09/14   [provider]  amLODipine (NORVASC) 10 MG tablet Take 1 tablet (10 mg total) by mouth daily. 11/16/19 02/16/20  Troy Sine, MD  Ascorbic Acid (VITAMIN C) 1000 MG tablet Take 1,000 mg by mouth daily.    [provider]  aspirin EC 81 MG tablet Take 81 mg by mouth daily.    [provider]  butalbital-acetaminophen-caffeine (FIORICET, ESGIC) [id number] MG tablet Take 1 tablet by mouth every 6 (six) hours as  needed for headache.  05/09/17   [provider]  cholecalciferol (VITAMIN D) 1000 units tablet Take 1,000 Units by mouth daily.     [provider]  estradiol (ESTRACE) 0.5 MG tablet Take 1/2 tablet daily and then stop when able 12/16/19   Huel Cote, NP  ezetimibe (ZETIA) 10 MG tablet Take 1 tablet (10 mg total) by mouth daily. 02/16/20   Troy Sine, MD  HYDROcodone-acetaminophen (NORCO) 10-325 MG tablet Take 0.5 tablets by mouth daily as needed for moderate pain.  05/12/17   [provider]  ibuprofen (ADVIL,MOTRIN) 200 MG tablet Take 200 mg by mouth daily.    [provider]  metoprolol succinate (TOPROL-XL) 25 MG 24 hr tablet Take 1 tablet (25 mg total) by mouth daily. 02/16/20   Troy Sine, MD  rosuvastatin (CRESTOR) 10 MG tablet TAKE 1/2 TABLET BY MOUTH EVERY DAY 03/01/20   Troy Sine, MD  valACYclovir (VALTREX) 500 MG tablet TAKE 1 TABLET BY MOUTH TWICE DAILY FOR 3 TO 5 DAYS THEN TAKE DAILY AS NEEDED 12/10/19   Huel Cote, NP  zolpidem (AMBIEN) 10 MG tablet Take 10 mg by mouth at bedtime.  01/09/14   [provider]    Allergies    Prednisone and Latex  Review of Systems   Review of Systems  Gastrointestinal: Positive for abdominal pain, nausea and vomiting.  All other systems reviewed and are negative.   Physical Exam Updated Vital Signs BP (!) 220/96   Pulse 78   Temp 98.3 F (36.8 C)   Resp 18   LMP  (LMP Unknown)   SpO2 99%   Physical Exam Vitals and nursing note reviewed.  Constitutional:      Appearance: She is well-developed.  HENT:     Head: Normocephalic and atraumatic.  Eyes:     Conjunctiva/sclera: Conjunctivae normal.     Pupils: Pupils are equal, round, and reactive to light.  Cardiovascular:     Rate and Rhythm: Normal rate and regular rhythm.     Heart sounds: Normal heart sounds.  Pulmonary:     Effort: Pulmonary effort is normal.     Breath sounds: Normal breath sounds.  Abdominal:  General: Bowel sounds are normal.     Palpations: Abdomen is soft.     Tenderness: There is abdominal tenderness in the epigastric area. There is no rebound.  Musculoskeletal:        General: Normal range of motion.     Cervical back: Normal range of motion.  Skin:    General: Skin is warm and dry.  Neurological:     Mental Status: She is alert and oriented to person, place, and time.     ED Results / Procedures / Treatments   Labs (all labs ordered are listed, but only abnormal results are displayed) Labs Reviewed  BASIC METABOLIC PANEL - Abnormal; Notable for the following components:      Result Value   Potassium 2.9 (*)    Glucose, Bld 136 (*)    Creatinine, Ser 1.18 (*)    GFR calc non Af Amer 47 (*)    GFR calc Af Amer 54 (*)    All other components within normal limits  CBC - Abnormal; Notable for the following components:   WBC 17.6 (*)    RBC 3.77 (*)    All other components within normal limits  LIPASE, BLOOD - Abnormal; Notable for the following components:   Lipase 5,738 (*)    All other components within normal limits  URINALYSIS, ROUTINE W REFLEX MICROSCOPIC - Abnormal; Notable for the following components:   Protein, ur 100 (*)    Nitrite POSITIVE (*)    Bacteria, UA MANY (*)    All other components within normal limits  HEPATIC FUNCTION PANEL - Abnormal; Notable for the following components:   AST 897 (*)    ALT 389 (*)    Alkaline Phosphatase 187 (*)    Total Bilirubin 1.6 (*)    Bilirubin, Direct 1.0 (*)    All other components within normal limits  TROPONIN I (HIGH SENSITIVITY) - Abnormal; Notable for the following components:   Troponin I (High Sensitivity) 25 (*)    All other components within normal limits  TROPONIN I (HIGH SENSITIVITY) - Abnormal; Notable for the following components:   Troponin I (High Sensitivity) 24 (*)    All other components within normal limits  SARS CORONAVIRUS 2 BY RT PCR (HOSPITAL ORDER, [facility])  TROPONIN I (HIGH SENSITIVITY)    EKG None  Radiology CT ABDOMEN PELVIS W CONTRAST  Result Date: 04/01/2020 CLINICAL DATA:  Pancreatitis. Elevated lipase. EXAM: CT ABDOMEN AND PELVIS WITH CONTRAST TECHNIQUE: Multidetector CT imaging of the abdomen and pelvis was performed using the standard protocol following bolus administration of intravenous contrast. CONTRAST:  3mL OMNIPAQUE IOHEXOL 300 MG/ML  SOLN COMPARISON:  02/17/2018 FINDINGS: Lower chest: Lungs are clear. Heart size is mildly enlarged. There is a right-sided breast nodule measuring approximately 1.8 cm (increased in size from prior study in 2019). Hepatobiliary: The liver is normal. The gallbladder is distended.There is intrahepatic and extrahepatic biliary ductal dilatation. Pancreas: Pancreatic duct is mildly dilated distally. There are no significant peripancreatic inflammatory changes. Spleen: Unremarkable. Adrenals/Urinary Tract: --Adrenal glands: Unremarkable. --Right kidney/ureter: No hydronephrosis or radiopaque kidney stones. --Left kidney/ureter: There is atrophy of the upper pole the left kidney. There is no hydronephrosis. --Urinary bladder: Unremarkable. Stomach/Bowel: --Stomach/Duodenum: No hiatal hernia or other gastric abnormality. Normal duodenal course and caliber. --Small bowel: Unremarkable. --Colon: Rectosigmoid diverticulosis without acute inflammation. --Appendix: Normal. Vascular/Lymphatic: Atherosclerotic calcification is present within the non-aneurysmal abdominal aorta, without hemodynamically significant stenosis. --No retroperitoneal lymphadenopathy. --No mesenteric lymphadenopathy. --No  pelvic or inguinal lymphadenopathy. Reproductive: Status post hysterectomy. No adnexal mass. Other: No ascites or free air. The abdominal wall is normal. Musculoskeletal. No acute displaced fractures. IMPRESSION: 1. Distended gallbladder with intrahepatic and extrahepatic biliary ductal dilatation. Pancreatic duct is  mildly dilated distally. No significant peripancreatic inflammatory changes. Correlation with laboratory studies is recommended. Follow-up with MRCP/ERCP is recommended. 2. Rectosigmoid diverticulosis without acute inflammation. 3. Right-sided breast nodule measuring approximately 1.8 cm (increased in size from prior study in 2019). Correlation with outpatient mammography is recommended. Aortic Atherosclerosis (ICD10-I70.0). Electronically Signed   By: Constance Holster M.D.   On: 04/01/2020 00:12    Procedures Procedures (including critical care time)  Medications Ordered in ED Medications  LORazepam (ATIVAN) injection 0.5 mg (has no administration in time range)  fentaNYL (SUBLIMAZE) injection 25-50 mcg (has no administration in time range)  labetalol (NORMODYNE) injection 10 mg (10 mg Intravenous Given 04/01/20 0141)  0.9 % NaCl with KCl 40 mEq / L  infusion (125 mL/hr Intravenous New Bag/Given 04/01/20 0143)  magnesium sulfate IVPB 1 g 100 mL (1 g Intravenous New Bag/Given 04/01/20 0151)  sodium chloride flush (NS) 0.9 % injection 3 mL (3 mLs Intravenous Given by Other 04/01/20 0001)  sodium chloride flush (NS) 0.9 % injection 3 mL (3 mLs Intravenous Given by Other 04/01/20 0002)  HYDROmorphone (DILAUDID) injection 1 mg (1 mg Intravenous Given 03/31/20 2310)  ondansetron (ZOFRAN) injection 4 mg (4 mg Intravenous Given 03/31/20 2308)  sodium chloride 0.9 % bolus 1,000 mL (0 mLs Intravenous Stopped 04/01/20 0010)  sodium chloride (PF) 0.9 % injection (  Given by Other 04/01/20 0002)  iohexol (OMNIPAQUE) 300 MG/ML solution 100 mL (80 mLs Intravenous Contrast Given 03/31/20 2340)  morphine 4 MG/ML injection 4 mg (4 mg Intravenous Given 03/31/20 2359)  LORazepam (ATIVAN) injection 0.5 mg (0.5 mg Intravenous Given 03/31/20 2358)  potassium chloride SA (KLOR-CON) CR tablet 20 mEq (20 mEq Oral Given 04/01/20 0148)    ED Course  I have reviewed the triage vital signs and the nursing notes.  Pertinent labs  & imaging results that were available during my care of the patient were reviewed by me and considered in my medical decision making (see chart for details).    MDM Rules/Calculators/A&P  69 year old female presenting to the ED with several days of epigastric abdominal pain, nausea, and vomiting.  Denies any history of same.  No sick contacts, fever, chills, or diarrhea.  She is afebrile and nontoxic in appearance here but does appear uncomfortable on exam.  Tenderness in the epigastrium without peritoneal signs.  Labs with transaminitis and significant elevation of lipase into the 5000s.  Patient denies any known history of pancreatitis.  No recent alcohol abuse, no history of gallbladder disease.  Given significant elevations, will obtain CT scan.  Patient given IV fluids, pain and nausea medication.  Patient does have history of MI in the past.  EKG here without any acute ischemic changes.  Initial troponin is negative.  CT scan with intra and extrahepatic ductal dilatation.  There is also mild pancreatic ductal dilatation distally.  No significant inflammatory changes about the pancreas.  MRCP/ERCP is recommended.  Patient will require admission, will also need GI input.  Second troponin is slightly elevated from prior, initial was 15, repeat was 24.  Change <20.  Denies any current chest pain.  Not felt to represent ACS.  Discussed with hospitalist, Dr. Myna Hidalgo-- he will admit for ongoing care.  MRCP ordered, GI will be consulted in  the AM after this is completed.  Final Clinical Impression(s) / ED Diagnoses Final diagnoses:  Acute pancreatitis without infection or necrosis, unspecified pancreatitis type    Rx / DC Orders ED Discharge Orders    None       Larene Pickett, PA-C 04/01/20 [id number]    Davonna Belling, MD 04/02/20 [phone]

## 2020-03-31 NOTE — ED Triage Notes (Signed)
Pt reports upper abdominal pain and vomiting since Sunday. States that pain is constant. States that she has not been able to keep anything down. Reports a hx of MI and states that she currently has internal shingles. A&Ox4.

## 2020-04-01 ENCOUNTER — Inpatient Hospital Stay (HOSPITAL_COMMUNITY): Payer: PPO

## 2020-04-01 ENCOUNTER — Encounter (HOSPITAL_COMMUNITY): Payer: Self-pay | Admitting: Family Medicine

## 2020-04-01 DIAGNOSIS — K859 Acute pancreatitis without necrosis or infection, unspecified: Secondary | ICD-10-CM | POA: Diagnosis present

## 2020-04-01 DIAGNOSIS — N63 Unspecified lump in unspecified breast: Secondary | ICD-10-CM | POA: Diagnosis not present

## 2020-04-01 DIAGNOSIS — G8929 Other chronic pain: Secondary | ICD-10-CM | POA: Diagnosis present

## 2020-04-01 DIAGNOSIS — M797 Fibromyalgia: Secondary | ICD-10-CM | POA: Diagnosis present

## 2020-04-01 DIAGNOSIS — R935 Abnormal findings on diagnostic imaging of other abdominal regions, including retroperitoneum: Secondary | ICD-10-CM | POA: Diagnosis not present

## 2020-04-01 DIAGNOSIS — E559 Vitamin D deficiency, unspecified: Secondary | ICD-10-CM | POA: Diagnosis present

## 2020-04-01 DIAGNOSIS — K838 Other specified diseases of biliary tract: Secondary | ICD-10-CM | POA: Diagnosis present

## 2020-04-01 DIAGNOSIS — N390 Urinary tract infection, site not specified: Secondary | ICD-10-CM | POA: Diagnosis present

## 2020-04-01 DIAGNOSIS — R1084 Generalized abdominal pain: Secondary | ICD-10-CM | POA: Diagnosis not present

## 2020-04-01 DIAGNOSIS — I10 Essential (primary) hypertension: Secondary | ICD-10-CM | POA: Diagnosis present

## 2020-04-01 DIAGNOSIS — F329 Major depressive disorder, single episode, unspecified: Secondary | ICD-10-CM | POA: Diagnosis present

## 2020-04-01 DIAGNOSIS — F419 Anxiety disorder, unspecified: Secondary | ICD-10-CM | POA: Diagnosis present

## 2020-04-01 DIAGNOSIS — R102 Pelvic and perineal pain: Secondary | ICD-10-CM | POA: Diagnosis present

## 2020-04-01 DIAGNOSIS — K851 Biliary acute pancreatitis without necrosis or infection: Secondary | ICD-10-CM

## 2020-04-01 DIAGNOSIS — N289 Disorder of kidney and ureter, unspecified: Secondary | ICD-10-CM | POA: Diagnosis not present

## 2020-04-01 DIAGNOSIS — R778 Other specified abnormalities of plasma proteins: Secondary | ICD-10-CM | POA: Diagnosis present

## 2020-04-01 DIAGNOSIS — B029 Zoster without complications: Secondary | ICD-10-CM | POA: Diagnosis present

## 2020-04-01 DIAGNOSIS — E876 Hypokalemia: Secondary | ICD-10-CM | POA: Diagnosis present

## 2020-04-01 DIAGNOSIS — I252 Old myocardial infarction: Secondary | ICD-10-CM | POA: Diagnosis not present

## 2020-04-01 DIAGNOSIS — I160 Hypertensive urgency: Secondary | ICD-10-CM | POA: Insufficient documentation

## 2020-04-01 DIAGNOSIS — D539 Nutritional anemia, unspecified: Secondary | ICD-10-CM | POA: Diagnosis present

## 2020-04-01 DIAGNOSIS — R748 Abnormal levels of other serum enzymes: Secondary | ICD-10-CM

## 2020-04-01 DIAGNOSIS — F909 Attention-deficit hyperactivity disorder, unspecified type: Secondary | ICD-10-CM | POA: Diagnosis present

## 2020-04-01 DIAGNOSIS — I2510 Atherosclerotic heart disease of native coronary artery without angina pectoris: Secondary | ICD-10-CM | POA: Diagnosis present

## 2020-04-01 DIAGNOSIS — N6310 Unspecified lump in the right breast, unspecified quadrant: Secondary | ICD-10-CM | POA: Diagnosis present

## 2020-04-01 DIAGNOSIS — B9620 Unspecified Escherichia coli [E. coli] as the cause of diseases classified elsewhere: Secondary | ICD-10-CM | POA: Diagnosis present

## 2020-04-01 DIAGNOSIS — Z20822 Contact with and (suspected) exposure to covid-19: Secondary | ICD-10-CM | POA: Diagnosis present

## 2020-04-01 DIAGNOSIS — N179 Acute kidney failure, unspecified: Secondary | ICD-10-CM | POA: Diagnosis present

## 2020-04-01 DIAGNOSIS — M542 Cervicalgia: Secondary | ICD-10-CM | POA: Diagnosis present

## 2020-04-01 DIAGNOSIS — D509 Iron deficiency anemia, unspecified: Secondary | ICD-10-CM | POA: Diagnosis present

## 2020-04-01 LAB — CBC WITH DIFFERENTIAL/PLATELET
Abs Immature Granulocytes: 0.06 10*3/uL (ref 0.00–0.07)
Basophils Absolute: 0.1 10*3/uL (ref 0.0–0.1)
Basophils Relative: 0 %
Eosinophils Absolute: 0 10*3/uL (ref 0.0–0.5)
Eosinophils Relative: 0 %
HCT: 31.6 % — ABNORMAL LOW (ref 36.0–46.0)
Hemoglobin: 10.6 g/dL — ABNORMAL LOW (ref 12.0–15.0)
Immature Granulocytes: 0 %
Lymphocytes Relative: 12 %
Lymphs Abs: 1.6 10*3/uL (ref 0.7–4.0)
MCH: 32.4 pg (ref 26.0–34.0)
MCHC: 33.5 g/dL (ref 30.0–36.0)
MCV: 96.6 fL (ref 80.0–100.0)
Monocytes Absolute: 1.1 10*3/uL — ABNORMAL HIGH (ref 0.1–1.0)
Monocytes Relative: 8 %
Neutro Abs: 10.6 10*3/uL — ABNORMAL HIGH (ref 1.7–7.7)
Neutrophils Relative %: 80 %
Platelets: 241 10*3/uL (ref 150–400)
RBC: 3.27 MIL/uL — ABNORMAL LOW (ref 3.87–5.11)
RDW: 13 % (ref 11.5–15.5)
WBC: 13.5 10*3/uL — ABNORMAL HIGH (ref 4.0–10.5)
nRBC: 0 % (ref 0.0–0.2)

## 2020-04-01 LAB — COMPREHENSIVE METABOLIC PANEL
ALT: 311 U/L — ABNORMAL HIGH (ref 0–44)
AST: 558 U/L — ABNORMAL HIGH (ref 15–41)
Albumin: 3.5 g/dL (ref 3.5–5.0)
Alkaline Phosphatase: 152 U/L — ABNORMAL HIGH (ref 38–126)
Anion gap: 9 (ref 5–15)
BUN: 15 mg/dL (ref 8–23)
CO2: 22 mmol/L (ref 22–32)
Calcium: 8.3 mg/dL — ABNORMAL LOW (ref 8.9–10.3)
Chloride: 103 mmol/L (ref 98–111)
Creatinine, Ser: 0.79 mg/dL (ref 0.44–1.00)
GFR calc Af Amer: >60 mL/min (ref >60)
GFR calc non Af Amer: >60 mL/min (ref >60)
Glucose, Bld: 110 mg/dL — ABNORMAL HIGH (ref 70–99)
Potassium: 3 mmol/L — ABNORMAL LOW (ref 3.5–5.1)
Sodium: 134 mmol/L — ABNORMAL LOW (ref 135–145)
Total Bilirubin: 1.2 mg/dL (ref 0.3–1.2)
Total Protein: 6.5 g/dL (ref 6.5–8.1)

## 2020-04-01 LAB — URINALYSIS, ROUTINE W REFLEX MICROSCOPIC
Bilirubin Urine: NEGATIVE
Glucose, UA: NEGATIVE mg/dL
Hgb urine dipstick: NEGATIVE
Ketones, ur: NEGATIVE mg/dL
Leukocytes,Ua: NEGATIVE
Nitrite: POSITIVE — AB
Protein, ur: 100 mg/dL — AB
Specific Gravity, Urine: 1.011 (ref 1.005–1.030)
pH: 6 (ref 5.0–8.0)

## 2020-04-01 LAB — HEPATIC FUNCTION PANEL
ALT: 389 U/L — ABNORMAL HIGH (ref 0–44)
AST: 897 U/L — ABNORMAL HIGH (ref 15–41)
Albumin: 4.2 g/dL (ref 3.5–5.0)
Alkaline Phosphatase: 187 U/L — ABNORMAL HIGH (ref 38–126)
Bilirubin, Direct: 1 mg/dL — ABNORMAL HIGH (ref 0.0–0.2)
Indirect Bilirubin: 0.6 mg/dL (ref 0.3–0.9)
Total Bilirubin: 1.6 mg/dL — ABNORMAL HIGH (ref 0.3–1.2)
Total Protein: 7.9 g/dL (ref 6.5–8.1)

## 2020-04-01 LAB — LIPASE, BLOOD: Lipase: 1724 U/L — ABNORMAL HIGH (ref 11–51)

## 2020-04-01 LAB — TROPONIN I (HIGH SENSITIVITY)
Troponin I (High Sensitivity): 24 ng/L — ABNORMAL HIGH
Troponin I (High Sensitivity): 46 ng/L — ABNORMAL HIGH (ref <18)

## 2020-04-01 LAB — MAGNESIUM: Magnesium: 2.1 mg/dL (ref 1.7–2.4)

## 2020-04-01 LAB — SARS CORONAVIRUS 2 BY RT PCR (HOSPITAL ORDER, PERFORMED IN ~~LOC~~ HOSPITAL LAB): SARS Coronavirus 2: NEGATIVE

## 2020-04-01 LAB — HIV ANTIBODY (ROUTINE TESTING W REFLEX): HIV Screen 4th Generation wRfx: NONREACTIVE

## 2020-04-01 IMAGING — MR MR ABDOMEN WO/W CM MRCP
18 of 21 series · 44 of 48 positions shown · IV contrast (gadavist)
Comparison: [DATE]

CLINICAL DATA: Evaluate for pancreatitis.

EXAM:
MRI ABDOMEN WITHOUT AND WITH CONTRAST (INCLUDING MRCP)
TECHNIQUE: Multiplanar multisequence MR imaging of the abdomen was performed
both before and after the administration of intravenous contrast.
Heavily T2-weighted images of the biliary and pancreatic ducts were
obtained, and three-dimensional MRCP images were rendered by post
processing.
CONTRAST:  7mL GADAVIST GADOBUTROL 1 MMOL/ML IV SOLN

[Series 3: T2 fat-sat · axial · 6.0mm · 1.25mm/px · 1 of 41 slices shown]
[im 1/41]
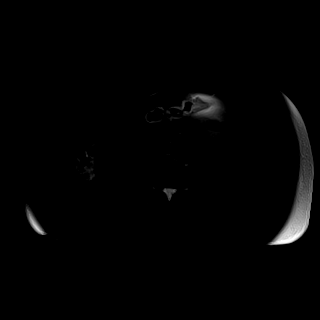

[Series 5: bSSFP · coronal · 6.0mm · 0.76mm/px · 1 of 42 slices shown]
[im 1/42]
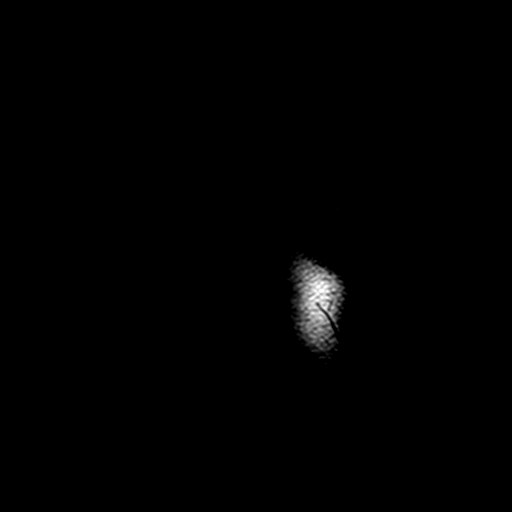

[Series 7: DWI · axial · 6.0mm · 1.40mm/px · z∈[-117,+171]mm · 3 of 82 slices shown (1 of 2)]
[im 1/82]
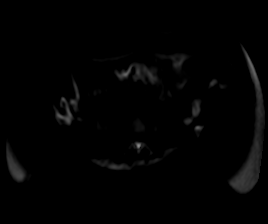
[im 41/82]
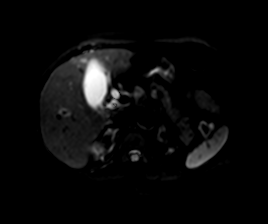
[im 82/82]
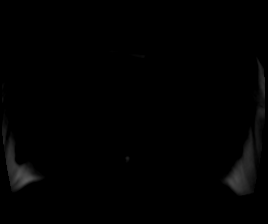

[Series 8: DWI · axial · 6.0mm · 1.40mm/px · z∈[-117,+171]mm · 2 of 41 slices shown (2 of 2)]
[im 1/41]
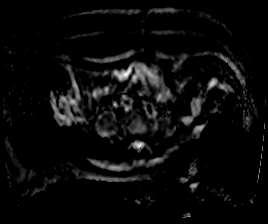
[im 41/41]
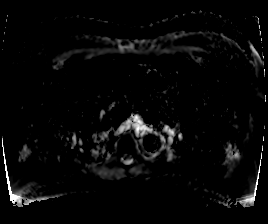

[Series 9: T1 · axial · 3.0mm · 1.25mm/px · z∈[-97,+164]mm · 3 of 88 slices shown (1 of 2)]
[im 1/88]
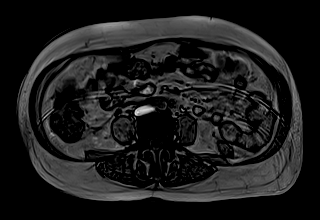
[im 44/88]
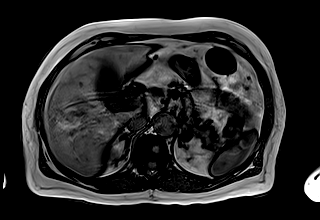
[im 88/88]
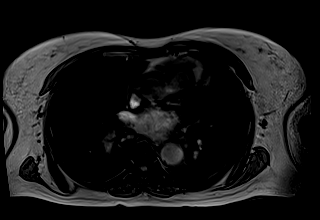

[Series 10: T1 · axial · 3.0mm · 1.25mm/px · z∈[-97,+164]mm · 3 of 88 slices shown (2 of 2)]
[im 1/88]
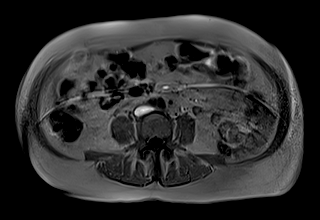
[im 44/88]
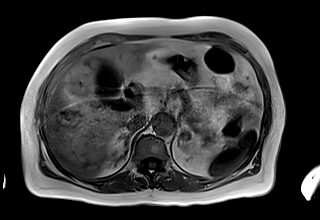
[im 88/88]
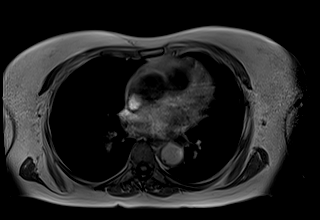

[Series 11: cor obl thk · sagittal · 50.0mm · 0.78mm/px · 1 of 9 slices shown]
[im 1/9]
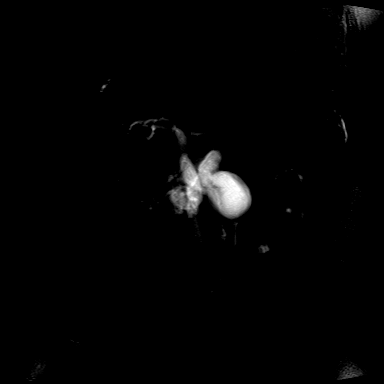

[Series 12: T2 · axial · 6.0mm · 1.56mm/px · 1 of 38 slices shown]
[im 1/38]
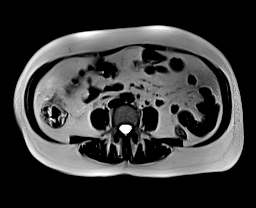

[Series 14: T1 dynamic · axial · 3.0mm · 1.25mm/px · z∈[-75,+162]mm · 3 of 80 slices shown (1 of 6)]
[im 1/80]
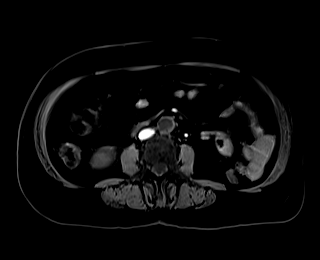
[im 40/80]
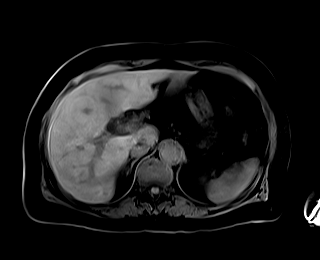
[im 80/80]
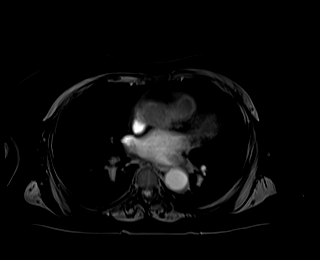

[Series 17: T1 dynamic · axial · 3.0mm · 1.25mm/px · z∈[-75,+162]mm · 3 of 80 slices shown (2 of 6)]
[im 1/80]
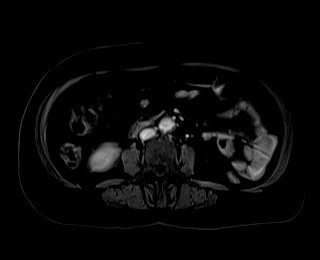
[im 40/80]
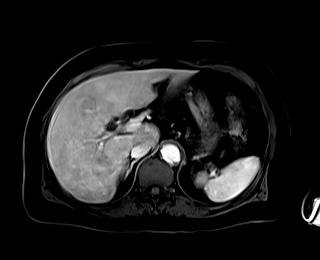
[im 80/80]
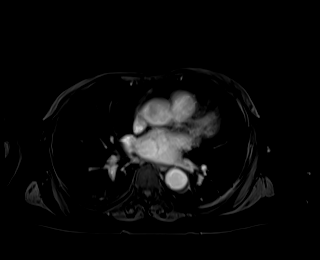

[Series 19: T1 dynamic · axial · 3.0mm · 1.25mm/px · z∈[-75,+162]mm · 3 of 80 slices shown (3 of 6)]
[im 1/80]
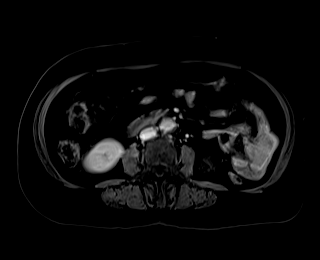
[im 40/80]
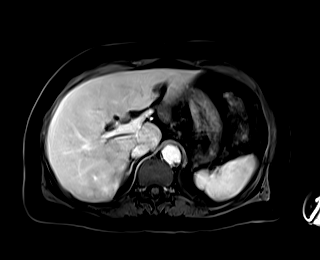
[im 80/80]
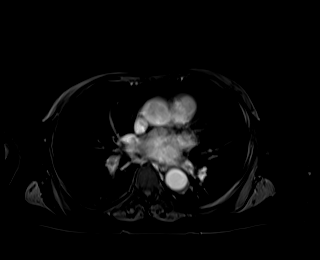

[Series 21: T1 dynamic · axial · 3.0mm · 1.25mm/px · z∈[-75,+162]mm · 3 of 80 slices shown (4 of 6)]
[im 1/80]
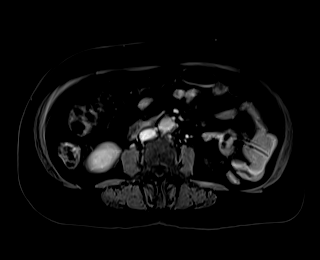
[im 40/80]
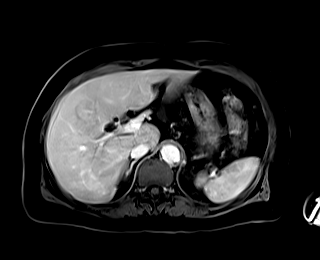
[im 80/80]
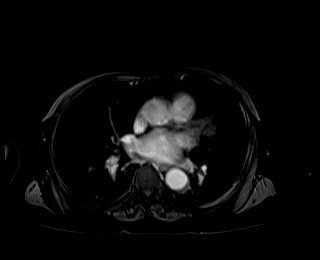

[Series 24: T1 dynamic · coronal · 5.0mm · 1.28mm/px · 2 of 56 slices shown (5 of 6)]
[im 1/56]
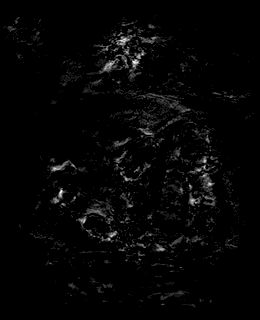
[im 56/56]
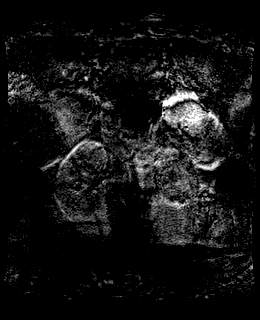

[Series 26: T1 dynamic · axial · 3.0mm · 1.25mm/px · z∈[-75,+162]mm · 3 of 80 slices shown (6 of 6)]
[im 1/80]
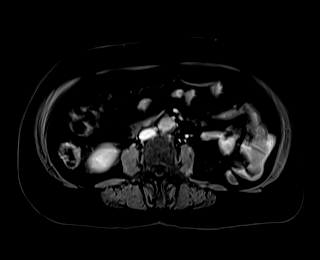
[im 40/80]
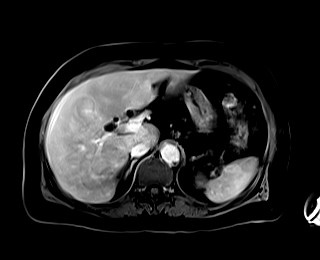
[im 80/80]
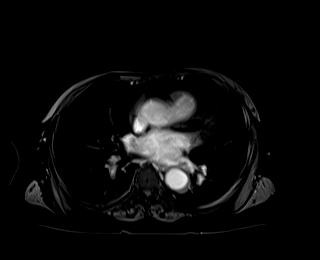

[Series 102: sub_20 sec · axial · 3.0mm · 1.25mm/px · z∈[-75,+162]mm · 3 of 80 slices shown]
[im 1/80]
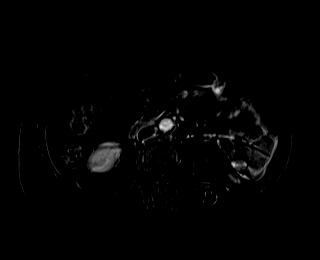
[im 40/80]
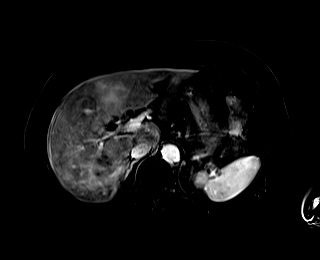
[im 80/80]
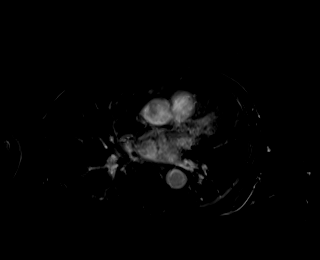

[Series 103: sub_45 sec · axial · 3.0mm · 1.25mm/px · z∈[-75,+162]mm · 3 of 80 slices shown]
[im 1/80]
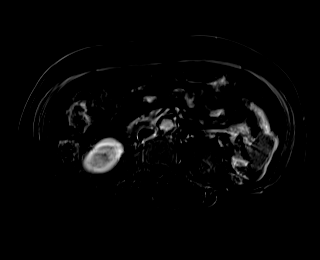
[im 40/80]
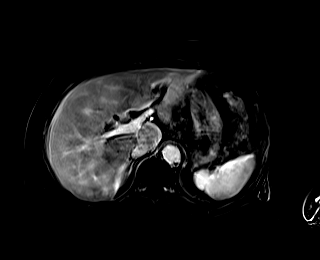
[im 80/80]
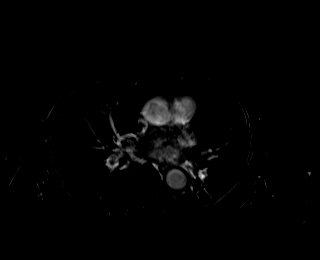

[Series 104: sub_90 sec · axial · 3.0mm · 1.25mm/px · z∈[-75,+162]mm · 3 of 80 slices shown]
[im 1/80]
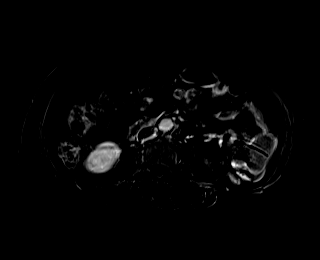
[im 40/80]
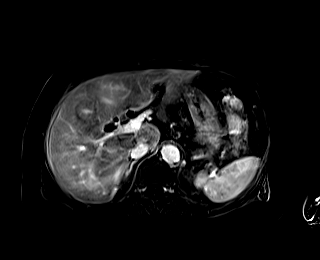
[im 80/80]
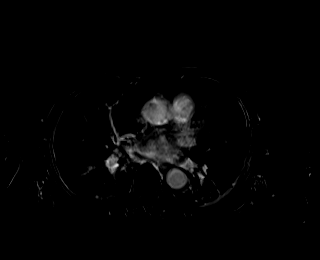

[Series 105: sub_delay · axial · 3.0mm · 1.25mm/px · z∈[-75,+162]mm · 3 of 80 slices shown]
[im 1/80]
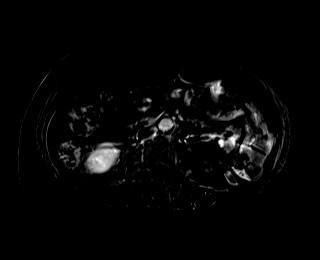
[im 40/80]
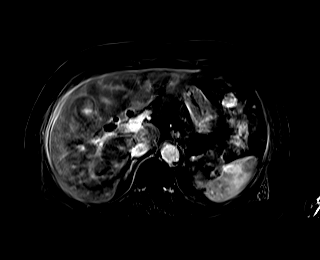
[im 80/80]
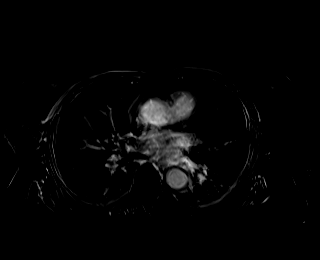

[44 of 48 positions shown; findings below may reference images not displayed]

FINDINGS: Lower chest: No acute findings.

Hepatobiliary: No suspicious liver lesions.

Distended gallbladder with mild gallbladder wall thickening. No
gallstones.

Mild intrahepatic biliary ductal dilatation. Fusiform dilatation of
the CBD is noted which has a maximum diameter of 1.5 cm. No
choledocholithiasis identified

Pancreas: Mild diffuse edema of the pancreas noted. Peripancreatic
fluid surrounds the head of pancreas and duodenum. No mass
identified. No main duct dilatation. No findings of pancreatic
necrosis or pseudocyst formation.

Spleen:  Within normal limits in size and appearance.

Adrenals/Urinary Tract: No masses identified. No evidence of
hydronephrosis. There is mild asymmetric left kidney atrophy.

Stomach/Bowel: Visualized portions within the abdomen are
unremarkable.

Vascular/Lymphatic: Extensive aortic atherosclerosis. No aneurysm.
The portal vein and splenic vein and portal venous confluence remain
patent.

Other:  None.

Musculoskeletal: No suspicious bone lesions identified.
IMPRESSION: 1. Mild diffuse edema of the pancreas compatible with acute
pancreatitis. No findings of pancreatic necrosis or pseudocyst
formation.
2. Fusiform dilatation of the CBD which has a maximum diameter of
1.5 cm. No choledocholithiasis identified.
3. Distended gallbladder with mild gallbladder wall thickening. No
gallstones.

## 2020-04-01 IMAGING — MR MR 3D RECON AT [facility]
18 of 21 series · 44 of 48 positions shown · IV contrast (gadavist)
Comparison: [DATE]

CLINICAL DATA: Evaluate for pancreatitis.

EXAM:
MRI ABDOMEN WITHOUT AND WITH CONTRAST (INCLUDING MRCP)
TECHNIQUE: Multiplanar multisequence MR imaging of the abdomen was performed
both before and after the administration of intravenous contrast.
Heavily T2-weighted images of the biliary and pancreatic ducts were
obtained, and three-dimensional MRCP images were rendered by post
processing.
CONTRAST:  7mL GADAVIST GADOBUTROL 1 MMOL/ML IV SOLN

[Series 3: T2 fat-sat · axial · 6.0mm · 1.25mm/px · 1 of 41 slices shown]
[im 1/41]
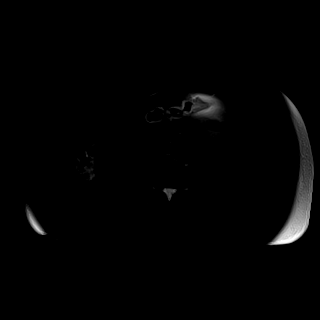

[Series 5: bSSFP · coronal · 6.0mm · 0.76mm/px · 1 of 42 slices shown]
[im 1/42]
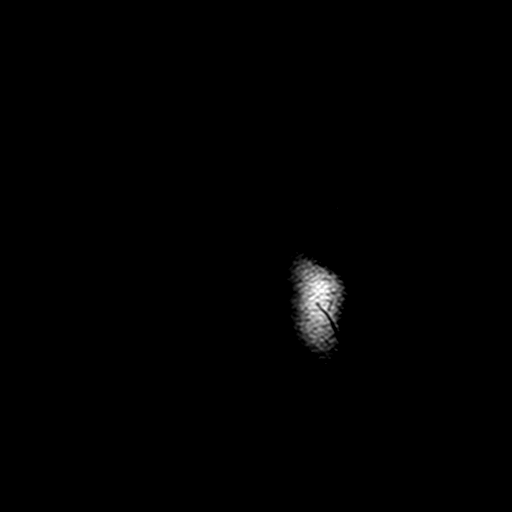

[Series 7: DWI · axial · 6.0mm · 1.40mm/px · z∈[-117,+171]mm · 3 of 82 slices shown (1 of 2)]
[im 1/82]
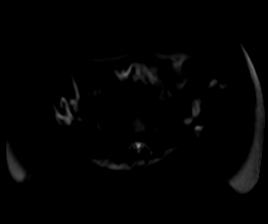
[im 41/82]
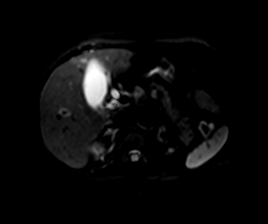
[im 82/82]
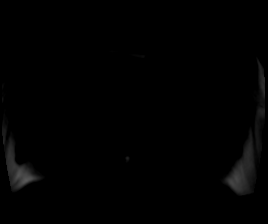

[Series 8: DWI · axial · 6.0mm · 1.40mm/px · z∈[-117,+171]mm · 2 of 41 slices shown (2 of 2)]
[im 1/41]
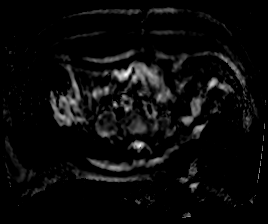
[im 41/41]
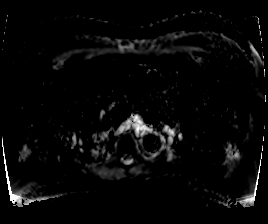

[Series 9: T1 · axial · 3.0mm · 1.25mm/px · z∈[-97,+164]mm · 3 of 88 slices shown (1 of 2)]
[im 1/88]
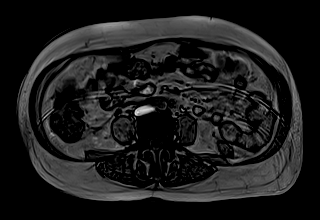
[im 44/88]
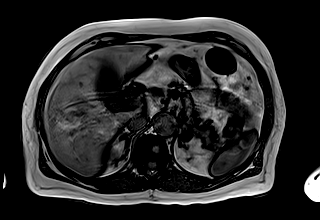
[im 88/88]
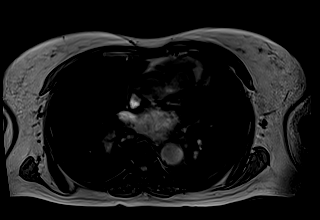

[Series 10: T1 · axial · 3.0mm · 1.25mm/px · z∈[-97,+164]mm · 3 of 88 slices shown (2 of 2)]
[im 1/88]
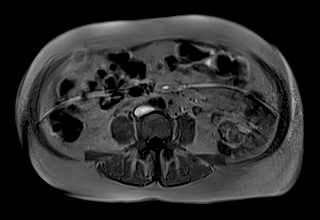
[im 44/88]
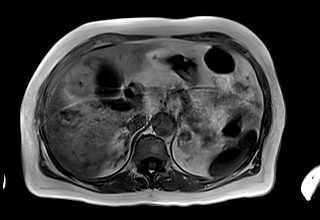
[im 88/88]
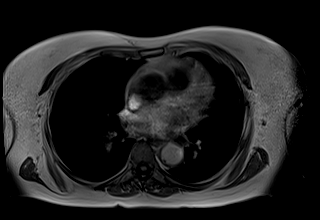

[Series 11: cor obl thk · sagittal · 50.0mm · 0.78mm/px · 1 of 9 slices shown]
[im 1/9]
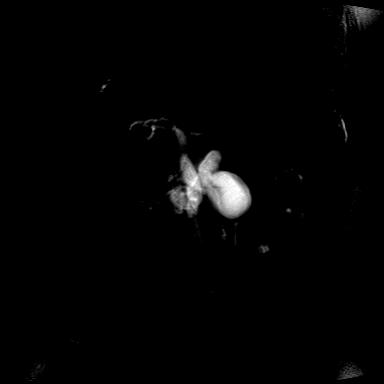

[Series 12: T2 · axial · 6.0mm · 1.56mm/px · 1 of 38 slices shown]
[im 1/38]
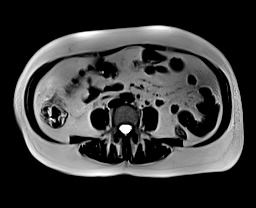

[Series 14: T1 dynamic · axial · 3.0mm · 1.25mm/px · z∈[-75,+162]mm · 3 of 80 slices shown (1 of 6)]
[im 1/80]
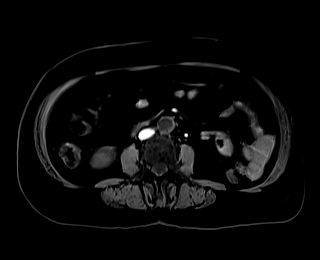
[im 40/80]
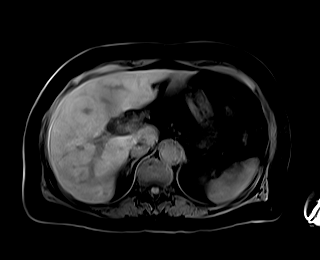
[im 80/80]
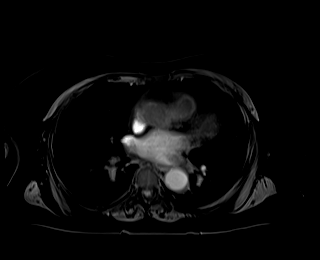

[Series 17: T1 dynamic · axial · 3.0mm · 1.25mm/px · z∈[-75,+162]mm · 3 of 80 slices shown (2 of 6)]
[im 1/80]
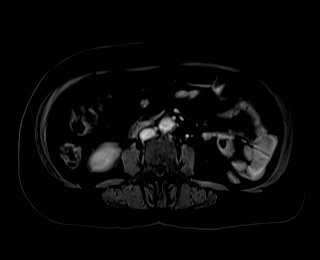
[im 40/80]
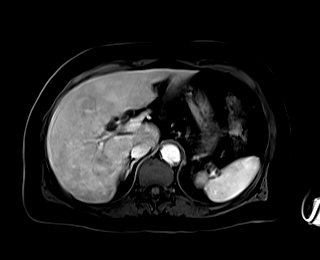
[im 80/80]
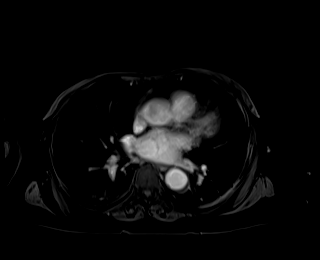

[Series 19: T1 dynamic · axial · 3.0mm · 1.25mm/px · z∈[-75,+162]mm · 3 of 80 slices shown (3 of 6)]
[im 1/80]
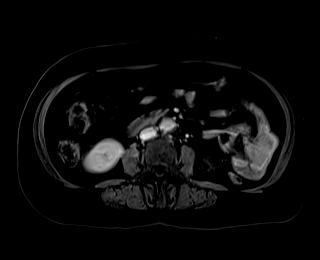
[im 40/80]
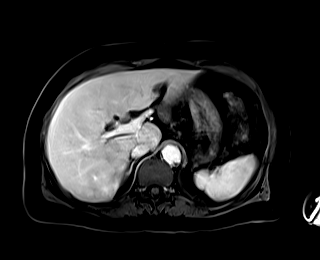
[im 80/80]
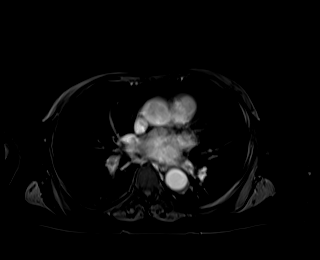

[Series 21: T1 dynamic · axial · 3.0mm · 1.25mm/px · z∈[-75,+162]mm · 3 of 80 slices shown (4 of 6)]
[im 1/80]
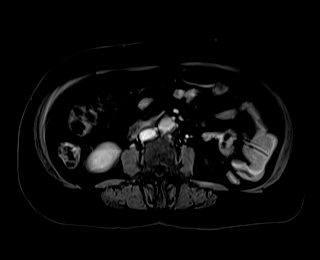
[im 40/80]
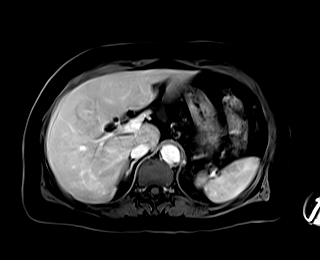
[im 80/80]
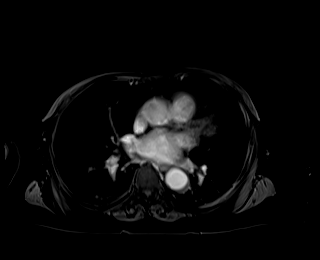

[Series 24: T1 dynamic · coronal · 5.0mm · 1.28mm/px · 2 of 56 slices shown (5 of 6)]
[im 1/56]
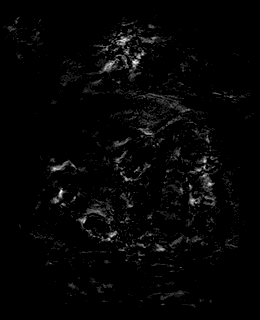
[im 56/56]
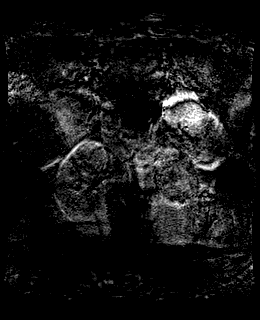

[Series 26: T1 dynamic · axial · 3.0mm · 1.25mm/px · z∈[-75,+162]mm · 3 of 80 slices shown (6 of 6)]
[im 1/80]
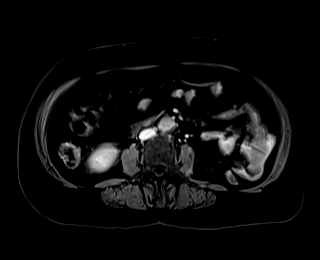
[im 40/80]
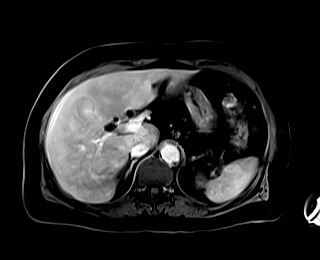
[im 80/80]
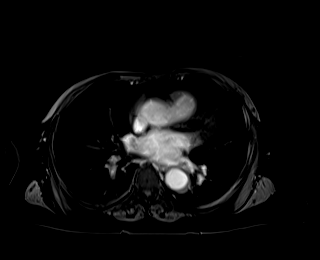

[Series 102: sub_20 sec · axial · 3.0mm · 1.25mm/px · z∈[-75,+162]mm · 3 of 80 slices shown]
[im 1/80]
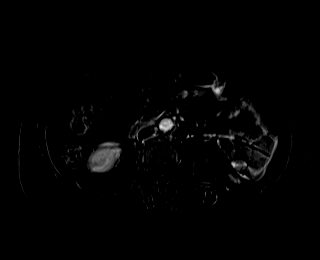
[im 40/80]
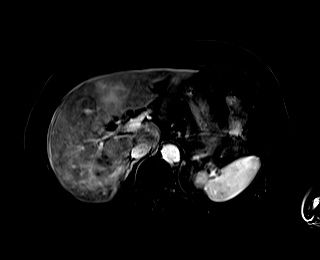
[im 80/80]
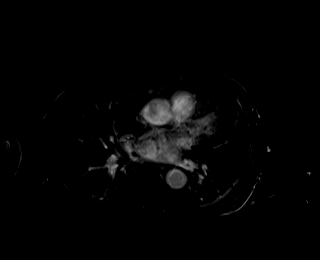

[Series 103: sub_45 sec · axial · 3.0mm · 1.25mm/px · z∈[-75,+162]mm · 3 of 80 slices shown]
[im 1/80]
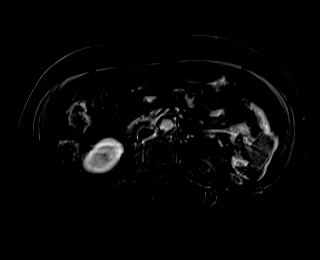
[im 40/80]
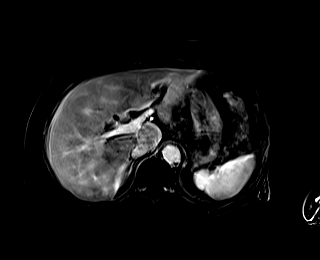
[im 80/80]
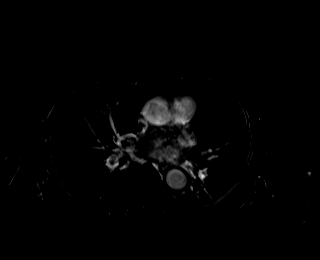

[Series 104: sub_90 sec · axial · 3.0mm · 1.25mm/px · z∈[-75,+162]mm · 3 of 80 slices shown]
[im 1/80]
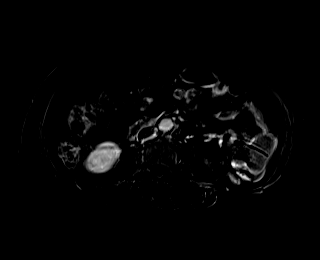
[im 40/80]
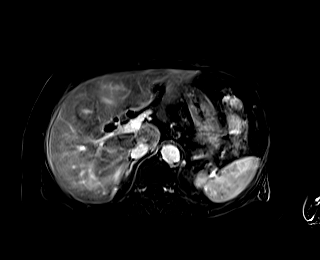
[im 80/80]
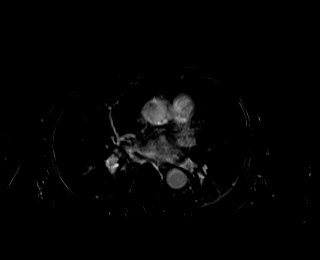

[Series 105: sub_delay · axial · 3.0mm · 1.25mm/px · z∈[-75,+162]mm · 3 of 80 slices shown]
[im 1/80]
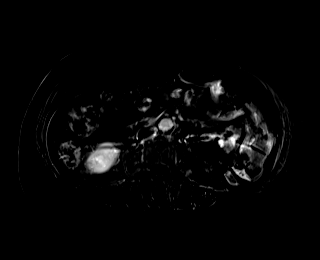
[im 40/80]
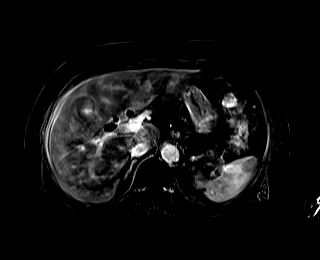
[im 80/80]
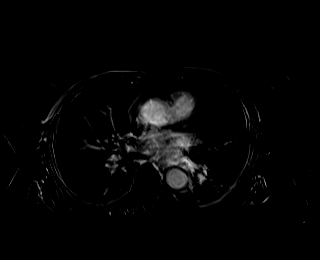

[44 of 48 positions shown; findings below may reference images not displayed]

FINDINGS: Lower chest: No acute findings.

Hepatobiliary: No suspicious liver lesions.

Distended gallbladder with mild gallbladder wall thickening. No
gallstones.

Mild intrahepatic biliary ductal dilatation. Fusiform dilatation of
the CBD is noted which has a maximum diameter of 1.5 cm. No
choledocholithiasis identified

Pancreas: Mild diffuse edema of the pancreas noted. Peripancreatic
fluid surrounds the head of pancreas and duodenum. No mass
identified. No main duct dilatation. No findings of pancreatic
necrosis or pseudocyst formation.

Spleen:  Within normal limits in size and appearance.

Adrenals/Urinary Tract: No masses identified. No evidence of
hydronephrosis. There is mild asymmetric left kidney atrophy.

Stomach/Bowel: Visualized portions within the abdomen are
unremarkable.

Vascular/Lymphatic: Extensive aortic atherosclerosis. No aneurysm.
The portal vein and splenic vein and portal venous confluence remain
patent.

Other:  None.

Musculoskeletal: No suspicious bone lesions identified.
IMPRESSION: 1. Mild diffuse edema of the pancreas compatible with acute
pancreatitis. No findings of pancreatic necrosis or pseudocyst
formation.
2. Fusiform dilatation of the CBD which has a maximum diameter of
1.5 cm. No choledocholithiasis identified.
3. Distended gallbladder with mild gallbladder wall thickening. No
gallstones.

## 2020-04-01 MED ORDER — GADOBUTROL 1 MMOL/ML IV SOLN
7.0000 mL | Freq: Once | INTRAVENOUS | Status: AC | PRN
  Administered 2020-04-01: 7 mL via INTRAVENOUS

## 2020-04-01 MED ORDER — LABETALOL HCL 5 MG/ML IV SOLN
10.0000 mg | INTRAVENOUS | Status: DC | PRN
  Administered 2020-04-01 – 2020-04-03 (×7): 10 mg via INTRAVENOUS
  Filled 2020-04-01 (×8): qty 4

## 2020-04-01 MED ORDER — AMLODIPINE BESYLATE 10 MG PO TABS
10.0000 mg | ORAL_TABLET | Freq: Every day | ORAL | Status: DC
  Administered 2020-04-01 – 2020-04-08 (×8): 10 mg via ORAL
  Filled 2020-04-01 (×8): qty 1

## 2020-04-01 MED ORDER — PROMETHAZINE HCL 25 MG/ML IJ SOLN
12.5000 mg | Freq: Four times a day (QID) | INTRAMUSCULAR | Status: DC | PRN
  Administered 2020-04-01 – 2020-04-07 (×16): 12.5 mg via INTRAVENOUS
  Filled 2020-04-01 (×17): qty 1

## 2020-04-01 MED ORDER — SODIUM CHLORIDE 0.9 % IV SOLN: INTRAVENOUS | Status: DC

## 2020-04-01 MED ORDER — MAGNESIUM SULFATE IN D5W 1-5 GM/100ML-% IV SOLN
1.0000 g | Freq: Once | INTRAVENOUS | Status: AC
  Administered 2020-04-01: 1 g via INTRAVENOUS
  Filled 2020-04-01: qty 100

## 2020-04-01 MED ORDER — LORAZEPAM 1 MG PO TABS
1.0000 mg | ORAL_TABLET | Freq: Once | ORAL | Status: AC
  Administered 2020-04-01: 1 mg via ORAL
  Filled 2020-04-01: qty 1

## 2020-04-01 MED ORDER — ONDANSETRON HCL 4 MG/2ML IJ SOLN
4.0000 mg | Freq: Four times a day (QID) | INTRAMUSCULAR | Status: AC | PRN
  Administered 2020-04-01 (×2): 4 mg via INTRAVENOUS
  Filled 2020-04-01 (×2): qty 2

## 2020-04-01 MED ORDER — POTASSIUM CHLORIDE CRYS ER 20 MEQ PO TBCR
20.0000 meq | EXTENDED_RELEASE_TABLET | Freq: Once | ORAL | Status: AC
  Administered 2020-04-01: 20 meq via ORAL
  Filled 2020-04-01: qty 1

## 2020-04-01 MED ORDER — LORAZEPAM 2 MG/ML IJ SOLN
0.5000 mg | Freq: Four times a day (QID) | INTRAMUSCULAR | Status: DC | PRN
  Administered 2020-04-01 – 2020-04-05 (×3): 0.5 mg via INTRAVENOUS
  Filled 2020-04-01 (×4): qty 1

## 2020-04-01 MED ORDER — POTASSIUM CHLORIDE IN NACL 40-0.9 MEQ/L-% IV SOLN
INTRAVENOUS | Status: AC
  Administered 2020-04-01: 125 mL/h via INTRAVENOUS
  Filled 2020-04-01: qty 1000

## 2020-04-01 MED ORDER — METOPROLOL SUCCINATE ER 25 MG PO TB24
25.0000 mg | ORAL_TABLET | Freq: Every day | ORAL | Status: DC
  Administered 2020-04-01: 25 mg via ORAL
  Filled 2020-04-01: qty 1

## 2020-04-01 MED ORDER — KETOROLAC TROMETHAMINE 30 MG/ML IJ SOLN: 15.0000 mg | Freq: Once | INTRAMUSCULAR | Status: AC

## 2020-04-01 MED ORDER — FENTANYL CITRATE (PF) 100 MCG/2ML IJ SOLN
25.0000 ug | INTRAMUSCULAR | Status: DC | PRN
  Administered 2020-04-01 (×4): 50 ug via INTRAVENOUS
  Filled 2020-04-01 (×4): qty 2

## 2020-04-01 MED ORDER — SODIUM CHLORIDE 0.9% FLUSH
3.0000 mL | Freq: Two times a day (BID) | INTRAVENOUS | Status: DC
  Administered 2020-04-01 – 2020-04-06 (×3): 3 mL via INTRAVENOUS

## 2020-04-01 MED ORDER — HYDROMORPHONE HCL 1 MG/ML IJ SOLN
0.5000 mg | INTRAMUSCULAR | Status: DC | PRN
  Administered 2020-04-01 – 2020-04-03 (×11): 0.5 mg via INTRAVENOUS
  Filled 2020-04-01 (×11): qty 0.5

## 2020-04-01 MED ORDER — POTASSIUM CHLORIDE 10 MEQ/100ML IV SOLN
10.0000 meq | INTRAVENOUS | Status: AC
  Administered 2020-04-01 (×5): 10 meq via INTRAVENOUS
  Filled 2020-04-01 (×5): qty 100

## 2020-04-01 MED ORDER — LIP MEDEX EX OINT
TOPICAL_OINTMENT | CUTANEOUS | Status: DC | PRN
  Filled 2020-04-01 (×2): qty 7

## 2020-04-01 MED ORDER — BUTALBITAL-APAP-CAFFEINE 50-325-40 MG PO TABS
1.0000 | ORAL_TABLET | ORAL | Status: DC | PRN
  Administered 2020-04-01 – 2020-04-07 (×24): 1 via ORAL
  Filled 2020-04-01 (×23): qty 1

## 2020-04-01 NOTE — Progress Notes (Signed)
Called about this patient for lap chole given presentation of pancreatitis.  She has been found to have elevated LFTs and dilated intra and extra hepatic ductal dilatation.  She does not have gallstones.  She has been evaluated by GI.  An MCRP was ordered which revealed a dilated CBD of 1.5cm with fusiform dilatation.  This raises concerns for a choledochocyst.  Agree with GI plan to await resolution of pancreatitis and then pursue further work up with EUS etc to evaluate for choledochocyst.  If she is found to have this, she will need to follow up with Dr. Barry Dienes as an outpatient to discuss surgical options for this as it is much more complicated than a lap chole.  No acute surgical indications this admission.  Will defer further work up to GI at this time and I have discussed this with the primary service as well as the GI service who are all in agreement with this plan.  Robin Arellano 12:18 PM 04/01/2020

## 2020-04-01 NOTE — ED Notes (Signed)
Attempted to give report. Patient has to have COVID test resulted before getting bed on floor

## 2020-04-01 NOTE — Progress Notes (Addendum)
PROGRESS NOTE    Robin Arellano  [id number] DOB: 1951-02-04 DOA: 03/31/2020 PCP: Sandi Mariscal, MD   Brief Narrative: Patient is a 69 year old female with history of hypertension, anxiety, hyperlipidemia, fibromyalgia who presented to the emergency room with complaint of severe epigastric pain, nausea and vomiting.  She describes the pain in the epigastric region accompanied by nausea and vomiting.  No history of alcohol intake.  No history of hepatobiliary disease.  On presentation she was hypertensive hypokalemic, had mild AKI.  Lipase was elevated to 5738.  She had leukocytosis.  CT abdomen/pelvis showed distended gallbladder with intra and extrahepatic biliary duct dilation, mild dilation of distal pancreatic duct, right breast nodule.  Started on pain management, IV fluids, antibiotics.  GI following.  Underwent  MRCP today.General surgery also consulted  Assessment & Plan:   Principal Problem:   Acute pancreatitis Active Problems:   Coronary artery calcification of native artery   Anxiety   Hypokalemia   Mild renal insufficiency   Breast nodule   Elevated troponin   Acute pancreatitis: Presented with 4 days history of severe epigastric pain with nausea and vomiting.  Lipase of 5738 on presentation.  CT abdomen/pelvis showed gallbladder distention, extra and intrahepatic bile duct dilation.  Mild dilation of distal pancreatic duct.  Elevated liver enzymes no history of ethanol use.  Most likely biliary etiology.  Monitor LFTs.    MRCP showed diffuse edema of the pancreas, fusiform dilation of CBD measuring 1.5 cm, no choledocholithiasis, distended gallbladder with mild gallbladder thickening, no gallstones We have consulted GI for possible ERCP. Continue pain management, bowel rest, IV fluid hydration. Due to findings on gallbladder, general surgery consulted  Hypokalemia: Being aggressively supplemented.  Levels will be monitored.  AKI: Resolved with IV fluids  Hypertensive  urgency: Blood pressure in the range of 200s on presentation.  Most likely associated with pain.  Continue pain control, labetalol as needed.  Will resume her home medication.  She takes amlodipine at home  Elevated troponin: Mildly elevated troponin.  No acute ischemic changes on EKG.  Denies any chest pain.  Right breast nodule: Noted incidentally on CT.  Outpatient mammogram recommended.         DVT prophylaxis:SCD Code Status: Full Family Communication: Called and discussed with husband on phone. Status is: Inpatient  Remains inpatient appropriate because:Ongoing diagnostic testing needed not appropriate for outpatient work up   Dispo: The patient is from: Home              Anticipated d/c is to: Home              Anticipated d/c date is: 2 days              Patient currently is not medically stable to d/c.    Consultants: GI  Procedures:None  Antimicrobials:  Anti-infectives (From admission, onward)   None      Subjective: Patient seen and examined the bedside this morning.  Complains of severe abdominal pain.  Also complains of neck pain.  No nausea or vomiting.  Objective: Vitals:   04/01/20 0139 04/01/20 0246 04/01/20 0249 04/01/20 0600  BP: (!) 196/88  (!) 180/78 (!) 187/85  Pulse: 77  77 80  Resp: 16  17 16   Temp:   99.1 F (37.3 C) 98.8 F (37.1 C)  TempSrc:   Oral Oral  SpO2: 99%  95% 93%  Weight:  76.9 kg    Height:  5\' 2"  (1.575 m)      Intake/Output  Summary (Last 24 hours) at 04/01/2020 0752 Last data filed at 04/01/2020 0717 Gross per 24 hour  Intake 1688.45 ml  Output --  Net 1688.45 ml   Filed Weights   04/01/20 0246  Weight: 76.9 kg    Examination:  General exam: In moderate distress due to abdominal pain,average built HEENT:PERRL,Oral mucosa moist, Ear/Nose normal on gross exam Respiratory system: Bilateral equal air entry, normal vesicular breath sounds, no wheezes or crackles  Cardiovascular system: S1 & S2 heard, RRR. No JVD,  murmurs, rubs, gallops or clicks. No pedal edema. Gastrointestinal system: Abdomen is nondistended, soft and has severe epigastric tenderness. No organomegaly or masses felt. Normal bowel sounds heard. Central nervous system: Alert and oriented. No focal neurological deficits. Extremities: No edema, no clubbing ,no cyanosis Skin: No rashes, lesions or ulcers,no icterus ,no pallor     Data Reviewed: I have personally reviewed following labs and imaging studies  CBC: Recent Labs  Lab 03/31/20 2051 04/01/20 0334  WBC 17.6* 13.5*  NEUTROABS  --  10.6*  HGB 12.4 10.6*  HCT 36.4 31.6*  MCV 96.6 96.6  PLT 280 [id number]   Basic Metabolic Panel: Recent Labs  Lab 03/31/20 2051 04/01/20 0334  NA 138 134*  K 2.9* 3.0*  CL 103 103  CO2 24 22  GLUCOSE 136* 110*  BUN 23 15  CREATININE 1.18* 0.79  CALCIUM 9.5 8.3*  MG  --  2.1   GFR: Estimated Creatinine Clearance: 63.7 mL/min (by C-G formula based on SCr of 0.79 mg/dL). Liver Function Tests: Recent Labs  Lab 03/30/20 2051 04/01/20 0334  AST 897* 558*  ALT 389* 311*  ALKPHOS 187* 152*  BILITOT 1.6* 1.2  PROT 7.9 6.5  ALBUMIN 4.2 3.5   Recent Labs  Lab 03/31/20 2051 04/01/20 0334  LIPASE 5,738* 1,724*   No results for input(s): AMMONIA in the last 168 hours. Coagulation Profile: No results for input(s): INR, PROTIME in the last 168 hours. Cardiac Enzymes: No results for input(s): CKTOTAL, CKMB, CKMBINDEX, TROPONINI in the last 168 hours. BNP (last 3 results) No results for input(s): PROBNP in the last 8760 hours. HbA1C: No results for input(s): HGBA1C in the last 72 hours. CBG: No results for input(s): GLUCAP in the last 168 hours. Lipid Profile: No results for input(s): CHOL, HDL, LDLCALC, TRIG, CHOLHDL, LDLDIRECT in the last 72 hours. Thyroid Function Tests: No results for input(s): TSH, T4TOTAL, FREET4, T3FREE, THYROIDAB in the last 72 hours. Anemia Panel: No results for input(s): VITAMINB12, FOLATE, FERRITIN,  TIBC, IRON, RETICCTPCT in the last 72 hours. Sepsis Labs: No results for input(s): PROCALCITON, LATICACIDVEN in the last 168 hours.  Recent Results (from the past 240 hour(s))  SARS Coronavirus 2 by RT PCR (hospital order, performed in [facility] hospital lab) Nasopharyngeal Nasopharyngeal Swab     Status: None   Collection Time: 04/01/20 12:46 AM   Specimen: Nasopharyngeal Swab  Result Value Ref Range Status   SARS Coronavirus 2 NEGATIVE NEGATIVE Final    Comment: (NOTE) SARS-CoV-2 target nucleic acids are NOT DETECTED. The SARS-CoV-2 RNA is generally detectable in upper and lower respiratory specimens during the acute phase of infection. The lowest concentration of SARS-CoV-2 viral copies this assay can detect is 250 copies / mL. A negative result does not preclude SARS-CoV-2 infection and should not be used as the sole basis for treatment or other patient management decisions.  A negative result may occur with improper specimen collection / handling, submission of specimen other than nasopharyngeal swab, presence of  viral mutation(s) within the areas targeted by this assay, and inadequate number of viral copies (<250 copies / mL). A negative result must be combined with clinical observations, patient history, and epidemiological information. Fact Sheet for Patients:   StrictlyIdeas.no Fact Sheet for Healthcare Providers: BankingDealers.co.za This test is not yet approved or cleared  by the [place] FDA and has been authorized for detection and/or diagnosis of SARS-CoV-2 by FDA under an Emergency Use Authorization (EUA).  This EUA will remain in effect (meaning this test can be used) for the duration of the COVID-19 declaration under Section 564(b)(1) of the Act, 21 U.S.C. section 360bbb-3(b)(1), unless the authorization is terminated or revoked sooner. Performed at [facility], [place] [address], [place], [place] [address]          Radiology Studies: CT ABDOMEN PELVIS W CONTRAST  Result Date: 04/01/2020 CLINICAL DATA:  Pancreatitis. Elevated lipase. EXAM: CT ABDOMEN AND PELVIS WITH CONTRAST TECHNIQUE: Multidetector CT imaging of the abdomen and pelvis was performed using the standard protocol following bolus administration of intravenous contrast. CONTRAST:  46mL OMNIPAQUE IOHEXOL 300 MG/ML  SOLN COMPARISON:  02/17/2018 FINDINGS: Lower chest: Lungs are clear. Heart size is mildly enlarged. There is a right-sided breast nodule measuring approximately 1.8 cm (increased in size from prior study in 2019). Hepatobiliary: The liver is normal. The gallbladder is distended.There is intrahepatic and extrahepatic biliary ductal dilatation. Pancreas: Pancreatic duct is mildly dilated distally. There are no significant peripancreatic inflammatory changes. Spleen: Unremarkable. Adrenals/Urinary Tract: --Adrenal glands: Unremarkable. --Right kidney/ureter: No hydronephrosis or radiopaque kidney stones. --Left kidney/ureter: There is atrophy of the upper pole the left kidney. There is no hydronephrosis. --Urinary bladder: Unremarkable. Stomach/Bowel: --Stomach/Duodenum: No hiatal hernia or other gastric abnormality. Normal duodenal course and caliber. --Small bowel: Unremarkable. --Colon: Rectosigmoid diverticulosis without acute inflammation. --Appendix: Normal. Vascular/Lymphatic: Atherosclerotic calcification is present within the non-aneurysmal abdominal aorta, without hemodynamically significant stenosis. --No retroperitoneal lymphadenopathy. --No mesenteric lymphadenopathy. --No pelvic or inguinal lymphadenopathy. Reproductive: Status post hysterectomy. No adnexal mass. Other: No ascites or free air. The abdominal wall is normal. Musculoskeletal. No acute displaced fractures. IMPRESSION: 1. Distended gallbladder with intrahepatic and extrahepatic biliary ductal dilatation. Pancreatic duct is mildly  dilated distally. No significant peripancreatic inflammatory changes. Correlation with laboratory studies is recommended. Follow-up with MRCP/ERCP is recommended. 2. Rectosigmoid diverticulosis without acute inflammation. 3. Right-sided breast nodule measuring approximately 1.8 cm (increased in size from prior study in 2019). Correlation with outpatient mammography is recommended. Aortic Atherosclerosis (ICD10-I70.0). Electronically Signed   By: Constance Holster M.D.   On: 04/01/2020 00:12        Scheduled Meds:  ketorolac  15 mg Intravenous Once   LORazepam  1 mg Oral Once   sodium chloride flush  3 mL Intravenous Q12H   Continuous Infusions:  0.9 % NaCl with KCl 40 mEq / L 125 mL/hr (04/01/20 0143)     LOS: 0 days    Time spent: 25 mins.More than 50% of that time was spent in counseling and/or coordination of care.      Shelly Coss, MD [facility] P5/21/2021, 7:52 AM

## 2020-04-01 NOTE — Consult Note (Signed)
Referring Provider:  [facility] Primary Care Physician:  Sandi Mariscal, MD Primary Gastroenterologist:  Dr. Ardis Hughs   Reason for Consultation:  Pancreatitis, elevated LFT's  HPI: Robin Arellano is a 69 y.o. female with medical history significant for hypertension, anxiety, and fibromyalgia who presented to the ED last night with complaints of severe epigastric pain, nausea, and nonbloody vomiting.  Patient reports that she had been in her usual state of health until 03/27/2020 when she developed severe pain localized to the epigastrium.  This was accompanied by nausea and nonbloody vomiting.  Symptoms have been constant, worse shortly after attempting to eat or drink, and with no alleviating factors identified.  She has never experienced this previously.  Symptoms continued and worsened, which prompted her visit to the ED.  She denies any alcohol use.  Upon evaluation AST 897 down to 558 this AM.  ALT 389 down to 311 this AM.  Alk phos 27 down to 152 this AM.  Total bili 1.6 down to 1.2 this AM.  3 weeks ago LFTs were completely normal.  K+ was 2.9 and is 3.0 this AM.  Lipase was 5738 down to 1724 today.  WBC count 17.6 down to 13.5 this AM, but appears that this has been slightly elevated on several occasions in the past.  CT scan of the abdomen and pelvis with contrast showed the following:  IMPRESSION: 1. Distended gallbladder with intrahepatic and extrahepatic biliary ductal dilatation. Pancreatic duct is mildly dilated distally. No significant peripancreatic inflammatory changes. Correlation with laboratory studies is recommended. Follow-up with MRCP/ERCP is recommended. 2. Rectosigmoid diverticulosis without acute inflammation. 3. Right-sided breast nodule measuring approximately 1.8 cm (increased in size from prior study in 2019). Correlation with outpatient mammography is recommended.  Aortic Atherosclerosis (ICD10-I70.0).  MRI abdomen/MRCP showed the following:  IMPRESSION: 1. Mild diffuse  edema of the pancreas compatible with acute pancreatitis. No findings of pancreatic necrosis or pseudocyst formation. 2. Fusiform dilatation of the CBD which has a maximum diameter of 1.5 cm. No choledocholithiasis identified. 3. Distended gallbladder with mild gallbladder wall thickening. No gallstones.   Past Medical History:  Diagnosis Date  . ADHD   . Anxiety   . Back pain   . Chronic female pelvic pain   . Depression   . Fibromyalgia   . H/O leukocytosis   . Headache   . MI (myocardial infarction) ([place])    Pt states she did not have a MI- EKG was normal, was GERD  . Osteoarthritis   . Ovarian cyst, right   . Post-operative nausea and vomiting   . SVD (spontaneous vaginal delivery)    x 2  . Vitamin D deficiency     Past Surgical History:  Procedure Laterality Date  . ABDOMINAL HYSTERECTOMY  1994   [facility].[place]  . COLONOSCOPY  08/12/2017   Hx polyp/[place]  . KNEE SURGERY Bilateral 1996   x 2 - arthroscopic  . LEFT HEART CATH AND CORONARY ANGIOGRAPHY N/A 11/25/2018   Procedure: LEFT HEART CATH AND CORONARY ANGIOGRAPHY;  Surgeon: Troy Sine, MD;  Location: [facility];  Service: Cardiovascular;  Laterality: N/A;  . PELVIC LAPAROSCOPY  1989   W LYSIS OF ADHESIONS/L SALPINGONEOSTOMY  . WISDOM TOOTH EXTRACTION      Prior to Admission medications   Medication Sig Start Date End Date Taking? Authorizing Provider  ALPRAZolam Duanne Moron) 0.5 MG tablet Take 0.5 mg by mouth at bedtime.  01/09/14  Yes [provider]  amLODipine ([place])  10 MG tablet Take 1 tablet (10 mg total) by mouth daily. 11/16/19 03/31/20 Yes Troy Sine, MD  amphetamine-dextroamphetamine (ADDERALL) 20 MG tablet Take 20 mg by mouth 3 (three) times daily. 02/24/20  Yes [provider]  Ascorbic Acid (VITAMIN C) 1000 MG tablet Take 1,000 mg by mouth daily.   Yes [provider]  aspirin EC 81 MG tablet Take 81 mg by mouth daily.   Yes [provider]  cholecalciferol (VITAMIN D) 1000 units tablet Take 1,000 Units by mouth daily.    Yes [provider]  estradiol (ESTRACE) 0.5 MG tablet Take 1/2 tablet daily and then stop when able 12/16/19  Yes Young, Candiss Norse, NP  ezetimibe (ZETIA) 10 MG tablet Take 1 tablet (10 mg total) by mouth daily. 02/16/20  Yes Troy Sine, MD  HYDROcodone-acetaminophen [facility]) 10-325 MG tablet Take 0.5 tablets by mouth daily as needed for moderate pain.  05/12/17  Yes [provider]  metoprolol succinate (TOPROL-XL) 25 MG 24 hr tablet Take 1 tablet (25 mg total) by mouth daily. 02/16/20  Yes Troy Sine, MD  rosuvastatin (CRESTOR) 10 MG tablet TAKE 1/2 TABLET BY MOUTH EVERY DAY 03/01/20  Yes Troy Sine, MD  valACYclovir (VALTREX) 500 MG tablet TAKE 1 TABLET BY MOUTH TWICE DAILY FOR 3 TO 5 DAYS THEN TAKE DAILY AS NEEDED 12/10/19  Yes Huel Cote, NP  zolpidem (AMBIEN) 10 MG tablet Take 10 mg by mouth at bedtime.  01/09/14  Yes [provider]  butalbital-acetaminophen-caffeine (FIORICET, ESGIC) 50-325-40 MG tablet Take 1 tablet by mouth every 6 (six) hours as needed for headache.  05/09/17   [provider]  ibuprofen (ADVIL,MOTRIN) 200 MG tablet Take 200 mg by mouth daily.    [provider]    Current Facility-Administered Medications  Medication Dose Route Frequency Provider Last Rate Last Admin  . amLODipine (NORVASC) tablet 10 mg  10 mg Oral Daily Shelly Coss, MD   10 mg at 04/01/20 0944  . butalbital-acetaminophen-caffeine (FIORICET) 50-325-40 MG per tablet 1 tablet  1 tablet Oral Q4H PRN Adhikari, Amrit, MD      . HYDROmorphone (DILAUDID) injection 0.5 mg  0.5 mg Intravenous Q4H PRN Adhikari, Amrit, MD      . ketorolac (TORADOL) 30 MG/ML injection 15 mg  15 mg Intravenous Once Blount, Xenia T, NP      . labetalol (NORMODYNE) injection 10 mg  10 mg Intravenous Q2H PRN Opyd, Ilene Qua, MD   10 mg at 04/01/20 0803  . lip balm (CARMEX) ointment    Topical PRN Opyd, Ilene Qua, MD      . LORazepam (ATIVAN) injection 0.5 mg  0.5 mg Intravenous Q6H PRN Opyd, Ilene Qua, MD      . metoprolol succinate (TOPROL-XL) 24 hr tablet 25 mg  25 mg Oral Daily Shelly Coss, MD   25 mg at 04/01/20 0945  . ondansetron (ZOFRAN) injection 4 mg  4 mg Intravenous Q6H PRN Blount, Scarlette Shorts T, NP   4 mg at 04/01/20 0500  . potassium chloride 10 mEq in 100 mL IVPB  10 mEq Intravenous Q1 Hr x 5 Adhikari, Amrit, MD 100 mL/hr at 04/01/20 0929 10 mEq at 04/01/20 0929  . sodium chloride flush (NS) 0.9 % injection 3 mL  3 mL Intravenous Q12H Opyd, Ilene Qua, MD        Allergies as of 03/31/2020 - Review Complete 03/31/2020  Allergen Reaction Noted  . Prednisone Hives, Itching, and Swelling  05/26/2014  . Latex Rash 05/23/2017    Family History  Problem Relation Age of Onset  . Cancer Mother        UTERINE  . Aneurysm Father   . Heart disease Paternal Grandfather   . COPD Brother   . Skin cancer Brother   . Other Sister        MGUS   . Skin cancer Brother   . Other Sister        MA  . Colon cancer Neg Hx   . Rectal cancer Neg Hx   . Stomach cancer Neg Hx   . Stroke Neg Hx   . Neuropathy Neg Hx     Social History   Socioeconomic History  . Marital status: Married    Spouse name: don  . Number of children: 2  . Years of education: College  . Highest education level: Not on file  Occupational History  . Occupation: Realtor  Tobacco Use  . Smoking status: Former Smoker    Packs/day: 0.15    Years: 20.00    Pack years: 3.00    Types: Cigarettes    Quit date: 11/12/1998    Years since quitting: 21.4  . Smokeless tobacco: Never Used  Substance and Sexual Activity  . Alcohol use: No  . Drug use: No  . Sexual activity: Not Currently    Birth control/protection: Post-menopausal, Surgical    Comment: HYSTERECTOMY  Other Topics Concern  . Not on file  Social History Narrative   Lives at home with her husband Timmothy Sours   Right handed   Caffeine:  unsweet tea, 2 glasses daily   Social Determinants of Health   Financial Resource Strain:   . Difficulty of Paying Living Expenses:   Food Insecurity:   . Worried About Charity fundraiser in the Last Year:   . Arboriculturist in the Last Year:   Transportation Needs:   . Film/video editor (Medical):   Marland Kitchen Lack of Transportation (Non-Medical):   Physical Activity:   . Days of Exercise per Week:   . Minutes of Exercise per Session:   Stress:   . Feeling of Stress :   Social Connections:   . Frequency of Communication with Friends and Family:   . Frequency of Social Gatherings with Friends and Family:   . Attends Religious Services:   . Active Member of Clubs or Organizations:   . Attends Archivist Meetings:   Marland Kitchen Marital Status:   Intimate Partner Violence:   . Fear of Current or Ex-Partner:   . Emotionally Abused:   Marland Kitchen Physically Abused:   . Sexually Abused:    Review of Systems: ROS is O/W negative except as mentioned in HPI.  Physical Exam: Vital signs in last 24 hours: Temp:  [98.3 F (36.8 C)-99.2 F (37.3 C)] 99.2 F (37.3 C) (05/21 1001) Pulse Rate:  [75-81] 80 (05/21 1001) Resp:  [16-20] 20 (05/21 1001) BP: (180-220)/(78-96) 193/88 (05/21 1001) SpO2:  [88 %-99 %] 88 % (05/21 1001) Weight:  [76.9 kg] 76.9 kg (05/21 0246) Last BM Date: 03/31/20 General:  Alert, Well-developed, well-nourished, pleasant and cooperative in NAD Head:  Normocephalic and atraumatic. Eyes:  Sclera clear, no icterus.  Conjunctiva pink. Ears:  Normal auditory acuity. Mouth:  No deformity or lesions.   Lungs:  Clear throughout to auscultation.  No wheezes, crackles, or rhonchi.  Heart:  Regular rate and rhythm; no murmurs, clicks, rubs, or gallops. Abdomen:  Soft, non-distended.  BS present but quiet.  Moderate upper abdominal TTP.    Msk:  Symmetrical without gross deformities. Pulses:  Normal pulses noted. Extremities:  Without clubbing or edema. Neurologic:  Alert and  oriented x 4;  grossly normal neurologically. Skin:  Intact without significant lesions or rashes. Psych:  Alert and cooperative. Normal mood and affect.  Intake/Output from previous day: 05/20 0701 - 05/21 0700 In: 999 [IV Piggyback:999] Out: -  Intake/Output this shift: Total I/O In: 689.5 [I.V.:689.5] Out: -   Lab Results: Recent Labs    03/31/20 2051 04/01/20 0334  WBC 17.6* 13.5*  HGB 12.4 10.6*  HCT 36.4 31.6*  PLT 280 241   BMET Recent Labs    03/31/20 2051 04/01/20 0334  NA 138 134*  K 2.9* 3.0*  CL 103 103  CO2 24 22  GLUCOSE 136* 110*  BUN 23 15  CREATININE 1.18* 0.79  CALCIUM 9.5 8.3*   LFT Recent Labs    03/30/20 2051 03/30/20 2051 04/01/20 0334  PROT 7.9   < > 6.5  ALBUMIN 4.2   < > 3.5  AST 897*   < > 558*  ALT 389*   < > 311*  ALKPHOS 187*   < > 152*  BILITOT 1.6*   < > 1.2  BILIDIR 1.0*  --   --   IBILI 0.6  --   --    < > = values in this interval not displayed.   Studies/Results: CT ABDOMEN PELVIS W CONTRAST  Result Date: 04/01/2020 CLINICAL DATA:  Pancreatitis. Elevated lipase. EXAM: CT ABDOMEN AND PELVIS WITH CONTRAST TECHNIQUE: Multidetector CT imaging of the abdomen and pelvis was performed using the standard protocol following bolus administration of intravenous contrast. CONTRAST:  75m OMNIPAQUE IOHEXOL 300 MG/ML  SOLN COMPARISON:  02/17/2018 FINDINGS: Lower chest: Lungs are clear. Heart size is mildly enlarged. There is a right-sided breast nodule measuring approximately 1.8 cm (increased in size from prior study in 2019). Hepatobiliary: The liver is normal. The gallbladder is distended.There is intrahepatic and extrahepatic biliary ductal dilatation. Pancreas: Pancreatic duct is mildly dilated distally. There are no significant peripancreatic inflammatory changes. Spleen: Unremarkable. Adrenals/Urinary Tract: --Adrenal glands: Unremarkable. --Right kidney/ureter: No hydronephrosis or radiopaque kidney stones. --Left kidney/ureter:  There is atrophy of the upper pole the left kidney. There is no hydronephrosis. --Urinary bladder: Unremarkable. Stomach/Bowel: --Stomach/Duodenum: No hiatal hernia or other gastric abnormality. Normal duodenal course and caliber. --Small bowel: Unremarkable. --Colon: Rectosigmoid diverticulosis without acute inflammation. --Appendix: Normal. Vascular/Lymphatic: Atherosclerotic calcification is present within the non-aneurysmal abdominal aorta, without hemodynamically significant stenosis. --No retroperitoneal lymphadenopathy. --No mesenteric lymphadenopathy. --No pelvic or inguinal lymphadenopathy. Reproductive: Status post hysterectomy. No adnexal mass. Other: No ascites or free air. The abdominal wall is normal. Musculoskeletal. No acute displaced fractures. IMPRESSION: 1. Distended gallbladder with intrahepatic and extrahepatic biliary ductal dilatation. Pancreatic duct is mildly dilated distally. No significant peripancreatic inflammatory changes. Correlation with laboratory studies is recommended. Follow-up with MRCP/ERCP is recommended. 2. Rectosigmoid diverticulosis without acute inflammation. 3. Right-sided breast nodule measuring approximately 1.8 cm (increased in size from prior study in 2019). Correlation with outpatient mammography is recommended. Aortic Atherosclerosis (ICD10-I70.0). Electronically Signed   By: CConstance HolsterM.D.   On: 04/01/2020 00:12   MR 3D Recon At Scanner  Result Date: 04/01/2020 CLINICAL DATA:  Evaluate for pancreatitis. EXAM: MRI ABDOMEN WITHOUT AND WITH CONTRAST (INCLUDING MRCP) TECHNIQUE: Multiplanar multisequence MR imaging of the abdomen was performed both before and after the administration of intravenous contrast. Heavily T2-weighted images  of the biliary and pancreatic ducts were obtained, and three-dimensional MRCP images were rendered by post processing. CONTRAST:  44m GADAVIST GADOBUTROL 1 MMOL/ML IV SOLN COMPARISON:  03/31/2020 FINDINGS: Lower chest: No acute  findings. Hepatobiliary: No suspicious liver lesions. Distended gallbladder with mild gallbladder wall thickening. No gallstones. Mild intrahepatic biliary ductal dilatation. Fusiform dilatation of the CBD is noted which has a maximum diameter of 1.5 cm. No choledocholithiasis identified Pancreas: Mild diffuse edema of the pancreas noted. Peripancreatic fluid surrounds the head of pancreas and duodenum. No mass identified. No main duct dilatation. No findings of pancreatic necrosis or pseudocyst formation. Spleen:  Within normal limits in size and appearance. Adrenals/Urinary Tract: No masses identified. No evidence of hydronephrosis. There is mild asymmetric left kidney atrophy. Stomach/Bowel: Visualized portions within the abdomen are unremarkable. Vascular/Lymphatic: Extensive aortic atherosclerosis. No aneurysm. The portal vein and splenic vein and portal venous confluence remain patent. Other:  None. Musculoskeletal: No suspicious bone lesions identified. IMPRESSION: 1. Mild diffuse edema of the pancreas compatible with acute pancreatitis. No findings of pancreatic necrosis or pseudocyst formation. 2. Fusiform dilatation of the CBD which has a maximum diameter of 1.5 cm. No choledocholithiasis identified. 3. Distended gallbladder with mild gallbladder wall thickening. No gallstones. Electronically Signed   By: TKerby MoorsM.D.   On: 04/01/2020 09:23   MR ABDOMEN MRCP W WO CONTAST  Result Date: 04/01/2020 CLINICAL DATA:  Evaluate for pancreatitis. EXAM: MRI ABDOMEN WITHOUT AND WITH CONTRAST (INCLUDING MRCP) TECHNIQUE: Multiplanar multisequence MR imaging of the abdomen was performed both before and after the administration of intravenous contrast. Heavily T2-weighted images of the biliary and pancreatic ducts were obtained, and three-dimensional MRCP images were rendered by post processing. CONTRAST:  745mGADAVIST GADOBUTROL 1 MMOL/ML IV SOLN COMPARISON:  03/31/2020 FINDINGS: Lower chest: No acute  findings. Hepatobiliary: No suspicious liver lesions. Distended gallbladder with mild gallbladder wall thickening. No gallstones. Mild intrahepatic biliary ductal dilatation. Fusiform dilatation of the CBD is noted which has a maximum diameter of 1.5 cm. No choledocholithiasis identified Pancreas: Mild diffuse edema of the pancreas noted. Peripancreatic fluid surrounds the head of pancreas and duodenum. No mass identified. No main duct dilatation. No findings of pancreatic necrosis or pseudocyst formation. Spleen:  Within normal limits in size and appearance. Adrenals/Urinary Tract: No masses identified. No evidence of hydronephrosis. There is mild asymmetric left kidney atrophy. Stomach/Bowel: Visualized portions within the abdomen are unremarkable. Vascular/Lymphatic: Extensive aortic atherosclerosis. No aneurysm. The portal vein and splenic vein and portal venous confluence remain patent. Other:  None. Musculoskeletal: No suspicious bone lesions identified. IMPRESSION: 1. Mild diffuse edema of the pancreas compatible with acute pancreatitis. No findings of pancreatic necrosis or pseudocyst formation. 2. Fusiform dilatation of the CBD which has a maximum diameter of 1.5 cm. No choledocholithiasis identified. 3. Distended gallbladder with mild gallbladder wall thickening. No gallstones. Electronically Signed   By: TaKerby Moors.D.   On: 04/01/2020 09:23   IMPRESSION:  *Acute pancreatitis suspected to be biliary in origin: LFTs and lipase trending down.  MRCP shows fusiform dilatation of the CBD, but no choledocholithiasis noted. *Hypokalemia due to GI losses:  K+ still 3.0 this AM *Leukocytosis:  WBC count 17.6 down to 13.5 this AM, but appears that this has been slightly elevated on several occasions in the past. *History of large 14 mm tubular adenoma completely removed by piecemeal and cautery in October 2018 with repeat colonoscopy recommended at 6-year interval that has not been performed. *Elevated  troponin:  Thought due to  elevated BP and acute illness.  PLAN: -Continue supportive care with IV fluids, pain control, antiemetics, etc.  I am going to increase NS back to 125 mL/hr. -Trend LFTs. -Correction of hypokalemia per hospitalist. -May need EUS at some point to evaluate the fusiform dilation of the CBD once acute pancreatitis resolves.  Laban Emperor. Marnesha Gagen  04/01/2020, 10:03 AM

## 2020-04-01 NOTE — Progress Notes (Signed)
Initial Nutrition Assessment  DOCUMENTATION CODES:   Obesity unspecified  INTERVENTION:  - diet advancement as medically feasible. - will monitor for potential need for small bore NGT and TF dependent on lipase trend and symptoms.   NUTRITION DIAGNOSIS:   Increased nutrient needs related to acute illness(pancreatitis) as evidenced by estimated needs.  GOAL:   Patient will meet greater than or equal to 90% of their needs  MONITOR:   Supplement acceptance, Diet advancement, Labs, Weight trends  REASON FOR ASSESSMENT:   Malnutrition Screening Tool  ASSESSMENT:   69 y.o. female with medical history of HTN, anxiety, and fibromyalgia. She presented to the ED with severe epigastric pain, nausea, and non-bloody vomiting which began on 5/16. Symptoms have been constant, worse shortly after eating or drinking, and no alleviating factors.  Patient has been NPO since admission 12 hours ago. Order recently adjusted to indicate she can now have sips of sprite or ginger ale. GI following. Underwent MRCP which showed mild edema of the pancreas (no necrosis or pseudocyst), dilatation of the CBD, distention of gallbladder with mild gall bladder wall thickening.  Patient reports good appetite and intakes until symptoms started on 5/16 (5 days ago). Since that time she was consuming soft food items which required minimal chewing and trying to drink liquids. Most of the time this increased abdominal pain and would lead to increased nausea and vomiting.   Per chart review, weight today is 169 lb, weight on 4/6 was 164 lb, and weight on 2/3 was 171 lb.   Labs reviewed; Na: 134 mmol/l, K: 3 mmol/l, Ca: 8.3 mg/dl, Alk Phos and LFTs high but trending down, lipase: 1724 units/L. Medications reviewed; 10 mEq IV KCl x5 runs 5/21, 20 mEq Klor-Con x1 dose 5/21. IVF; NS @ 125 ml/hr.    NUTRITION - FOCUSED PHYSICAL EXAM:  completed; no muscle or fat wasting, no edema at this time.   Diet Order:   Diet  Order            Diet NPO time specified Except for: Ice Chips, Sips with Meds, Other (See Comments)  Diet effective now              EDUCATION NEEDS:   Not appropriate for education at this time  Skin:  Skin Assessment: Reviewed RN Assessment  Last BM:  5/20  Height:   Ht Readings from Last 1 Encounters:  04/01/20 '5\' 2"'$  (1.575 m)    Weight:   Wt Readings from Last 1 Encounters:  04/01/20 76.9 kg    Estimated Nutritional Needs:  Kcal:  2000-2200 kcal Protein:  110-125 grams Fluid:  >/= 2.2 L/day     Jarome Matin, MS, RD, LDN, CNSC Inpatient Clinical Dietitian RD pager # available in [facility]  After hours/weekend pager # available in [facility]

## 2020-04-01 NOTE — H&P (Signed)
History and Physical    Robin Arellano [id number] DOB: Feb 15, 1951 DOA: 03/31/2020  PCP: Sandi Mariscal, MD   Patient coming from: Home   Chief Complaint: Upper abdominal pain, N/V   HPI: Robin Arellano is a 69 y.o. female with medical history significant for hypertension, anxiety, and fibromyalgia, now presenting to the emergency department with severe epigastric pain, nausea, and nonbloody vomiting.  Patient reports that she had been in her usual state of health until 03/27/2020 when she developed severe pain localized to the epigastrium.  This was accompanied by nausea and nonbloody vomiting.  Symptoms have been constant, worse shortly after attempting to eat or drink, and with no alleviating factors identified.  She has never experienced this previously.  She denies any alcohol use.  She denies any history of hepatobiliary disease.  She denies chest pain.  She reports occasional leg swelling but not recently.  Denies cough or hemoptysis.  ED Course: Upon arrival to the ED, patient is found to be afebrile, saturating well on room air, and hypertensive to 210/90.  EKG features sinus rhythm.  Chemistry panel notable for potassium 2.9 and creatinine 1.18, up from 1.04 last month.  Lipase is elevated 5738.  CBC features a leukocytosis 17,600.  Initial troponin is normal and second level elevated to 25.  CT of the abdomen and pelvis demonstrates distended gallbladder with intra and extrahepatic biliary ductal dilatation, mild dilatation of the distal pancreatic duct, and right breast nodule.  Patient was treated with Dilaudid, morphine, IV fluids, Zofran, and Ativan in the ED.  Review of Systems:  All other systems reviewed and apart from HPI, are negative.  Past Medical History:  Diagnosis Date  . ADHD   . Anxiety   . Back pain   . Chronic female pelvic pain   . Depression   . Fibromyalgia   . H/O leukocytosis   . Headache   . MI (myocardial infarction) ([place])    Pt states she did not have a MI-  EKG was normal, was GERD  . Osteoarthritis   . Ovarian cyst, right   . Post-operative nausea and vomiting   . SVD (spontaneous vaginal delivery)    x 2  . Vitamin D deficiency     Past Surgical History:  Procedure Laterality Date  . ABDOMINAL HYSTERECTOMY  1994   [facility]  . COLONOSCOPY  08/12/2017   Hx polyp/[place]  . KNEE SURGERY Bilateral 1996   x 2 - arthroscopic  . LEFT HEART CATH AND CORONARY ANGIOGRAPHY N/A 11/25/2018   Procedure: LEFT HEART CATH AND CORONARY ANGIOGRAPHY;  Surgeon: Troy Sine, MD;  Location: [facility];  Service: Cardiovascular;  Laterality: N/A;  . PELVIC LAPAROSCOPY  1989   W LYSIS OF ADHESIONS/L SALPINGONEOSTOMY  . WISDOM TOOTH EXTRACTION       reports that she quit smoking about 21 years ago. Her smoking use included cigarettes. She has a 3.00 pack-year smoking history. She has never used smokeless tobacco. She reports that she does not drink alcohol or use drugs.  Allergies  Allergen Reactions  . Prednisone Hives, Itching and Swelling  . Latex Rash    Family History  Problem Relation Age of Onset  . Cancer Mother        UTERINE  . Aneurysm Father   . Heart disease Paternal Grandfather   . COPD Brother   . Skin cancer Brother   . Other Sister        MGUS   .  Skin cancer Brother   . Other Sister        MA  . Colon cancer Neg Hx   . Rectal cancer Neg Hx   . Stomach cancer Neg Hx   . Stroke Neg Hx   . Neuropathy Neg Hx      Prior to Admission medications   Medication Sig Start Date End Date Taking? Authorizing Provider  ALPRAZolam Duanne Moron) 0.5 MG tablet Take 0.5 mg by mouth at bedtime.  01/09/14  Yes [provider]  amLODipine (NORVASC) 10 MG tablet Take 1 tablet (10 mg total) by mouth daily. 11/16/19 03/31/20 Yes Troy Sine, MD  amphetamine-dextroamphetamine (ADDERALL) 20 MG tablet Take 20 mg by mouth 3 (three) times daily. 02/24/20  Yes [provider]  Ascorbic Acid (VITAMIN C)  1000 MG tablet Take 1,000 mg by mouth daily.   Yes [provider]  aspirin EC 81 MG tablet Take 81 mg by mouth daily.   Yes [provider]  cholecalciferol (VITAMIN D) 1000 units tablet Take 1,000 Units by mouth daily.    Yes [provider]  estradiol (ESTRACE) 0.5 MG tablet Take 1/2 tablet daily and then stop when able 12/16/19  Yes Young, Candiss Norse, NP  ezetimibe (ZETIA) 10 MG tablet Take 1 tablet (10 mg total) by mouth daily. 02/16/20  Yes Troy Sine, MD  HYDROcodone-acetaminophen [facility]) 10-325 MG tablet Take 0.5 tablets by mouth daily as needed for moderate pain.  05/12/17  Yes [provider]  metoprolol succinate (TOPROL-XL) 25 MG 24 hr tablet Take 1 tablet (25 mg total) by mouth daily. 02/16/20  Yes Troy Sine, MD  rosuvastatin (CRESTOR) 10 MG tablet TAKE 1/2 TABLET BY MOUTH EVERY DAY 03/01/20  Yes Troy Sine, MD  valACYclovir (VALTREX) 500 MG tablet TAKE 1 TABLET BY MOUTH TWICE DAILY FOR 3 TO 5 DAYS THEN TAKE DAILY AS NEEDED 12/10/19  Yes Huel Cote, NP  zolpidem (AMBIEN) 10 MG tablet Take 10 mg by mouth at bedtime.  01/09/14  Yes [provider]  butalbital-acetaminophen-caffeine (FIORICET, ESGIC) 50-325-40 MG tablet Take 1 tablet by mouth every 6 (six) hours as needed for headache.  05/09/17   [provider]  ibuprofen (ADVIL,MOTRIN) 200 MG tablet Take 200 mg by mouth daily.    [provider]    Physical Exam: Vitals:   03/31/20 2031 03/31/20 2247 04/01/20 0000 04/01/20 0030  BP: (!) 210/78 (!) 220/96 (!) 211/91 (!) 198/90  Pulse: 75 78 81 78  Resp: 16 18 19 19   Temp: 98.3 F (36.8 C)     SpO2: 99% 99% 95% 98%    Constitutional: NAD, calm  Eyes: PERTLA, lids and conjunctivae normal ENMT: Mucous membranes are moist. Posterior pharynx clear of any exudate or lesions.   Neck: normal, supple, no masses, no thyromegaly Respiratory:  no wheezing, no crackles. No accessory muscle use.  Cardiovascular: S1 & S2  heard, regular rate and rhythm. No extremity edema.   Abdomen: Soft, non-distended, tender in epigastrium without rebound pain or guarding. Bowel sounds active.  Musculoskeletal: no clubbing / cyanosis. No joint deformity upper and lower extremities.   Skin: no significant rashes, lesions, ulcers. Warm, dry, well-perfused. Neurologic: CN 2-12 grossly intact. Sensation intact. Moving all extremities.  Psychiatric: Alert and oriented to person, place, and situation. Pleasant and cooperative.    Labs and Imaging on Admission: I have personally reviewed following labs and imaging studies  CBC: Recent Labs  Lab 03/31/20 2051  WBC  17.6*  HGB 12.4  HCT 36.4  MCV 96.6  PLT 123456   Basic Metabolic Panel: Recent Labs  Lab 03/31/20 2051  NA 138  K 2.9*  CL 103  CO2 24  GLUCOSE 136*  BUN 23  CREATININE 1.18*  CALCIUM 9.5   GFR: CrCl cannot be calculated (Unknown ideal weight.). Liver Function Tests: No results for input(s): AST, ALT, ALKPHOS, BILITOT, PROT, ALBUMIN in the last 168 hours. Recent Labs  Lab 03/31/20 2051  LIPASE 5,738*   No results for input(s): AMMONIA in the last 168 hours. Coagulation Profile: No results for input(s): INR, PROTIME in the last 168 hours. Cardiac Enzymes: No results for input(s): CKTOTAL, CKMB, CKMBINDEX, TROPONINI in the last 168 hours. BNP (last 3 results) No results for input(s): PROBNP in the last 8760 hours. HbA1C: No results for input(s): HGBA1C in the last 72 hours. CBG: No results for input(s): GLUCAP in the last 168 hours. Lipid Profile: No results for input(s): CHOL, HDL, LDLCALC, TRIG, CHOLHDL, LDLDIRECT in the last 72 hours. Thyroid Function Tests: No results for input(s): TSH, T4TOTAL, FREET4, T3FREE, THYROIDAB in the last 72 hours. Anemia Panel: No results for input(s): VITAMINB12, FOLATE, FERRITIN, TIBC, IRON, RETICCTPCT in the last 72 hours. Urine analysis:    Component Value Date/Time   COLORURINE YELLOW 03/31/2020 2330     APPEARANCEUR CLEAR 03/31/2020 2330   LABSPEC 1.011 03/31/2020 2330   PHURINE 6.0 03/31/2020 2330   GLUCOSEU NEGATIVE 03/31/2020 2330   HGBUR NEGATIVE 03/31/2020 2330   BILIRUBINUR NEGATIVE 03/31/2020 2330   KETONESUR NEGATIVE 03/31/2020 2330   PROTEINUR 100 (A) 03/31/2020 2330   UROBILINOGEN 0.2 05/26/2014 1158   NITRITE POSITIVE (A) 03/31/2020 2330   LEUKOCYTESUR NEGATIVE 03/31/2020 2330   Sepsis Labs: @LABRCNTIP (procalcitonin:4,lacticidven:4) )No results found for this or any previous visit (from the past 240 hour(s)).   Radiological Exams on Admission: CT ABDOMEN PELVIS W CONTRAST  Result Date: 04/01/2020 CLINICAL DATA:  Pancreatitis. Elevated lipase. EXAM: CT ABDOMEN AND PELVIS WITH CONTRAST TECHNIQUE: Multidetector CT imaging of the abdomen and pelvis was performed using the standard protocol following bolus administration of intravenous contrast. CONTRAST:  91mL OMNIPAQUE IOHEXOL 300 MG/ML  SOLN COMPARISON:  02/17/2018 FINDINGS: Lower chest: Lungs are clear. Heart size is mildly enlarged. There is a right-sided breast nodule measuring approximately 1.8 cm (increased in size from prior study in 2019). Hepatobiliary: The liver is normal. The gallbladder is distended.There is intrahepatic and extrahepatic biliary ductal dilatation. Pancreas: Pancreatic duct is mildly dilated distally. There are no significant peripancreatic inflammatory changes. Spleen: Unremarkable. Adrenals/Urinary Tract: --Adrenal glands: Unremarkable. --Right kidney/ureter: No hydronephrosis or radiopaque kidney stones. --Left kidney/ureter: There is atrophy of the upper pole the left kidney. There is no hydronephrosis. --Urinary bladder: Unremarkable. Stomach/Bowel: --Stomach/Duodenum: No hiatal hernia or other gastric abnormality. Normal duodenal course and caliber. --Small bowel: Unremarkable. --Colon: Rectosigmoid diverticulosis without acute inflammation. --Appendix: Normal. Vascular/Lymphatic: Atherosclerotic  calcification is present within the non-aneurysmal abdominal aorta, without hemodynamically significant stenosis. --No retroperitoneal lymphadenopathy. --No mesenteric lymphadenopathy. --No pelvic or inguinal lymphadenopathy. Reproductive: Status post hysterectomy. No adnexal mass. Other: No ascites or free air. The abdominal wall is normal. Musculoskeletal. No acute displaced fractures. IMPRESSION: 1. Distended gallbladder with intrahepatic and extrahepatic biliary ductal dilatation. Pancreatic duct is mildly dilated distally. No significant peripancreatic inflammatory changes. Correlation with laboratory studies is recommended. Follow-up with MRCP/ERCP is recommended. 2. Rectosigmoid diverticulosis without acute inflammation. 3. Right-sided breast nodule measuring approximately 1.8 cm (increased in size from prior study in 2019). Correlation with  outpatient mammography is recommended. Aortic Atherosclerosis (ICD10-I70.0). Electronically Signed   By: Constance Holster M.D.   On: 04/01/2020 00:12    EKG: Independently reviewed. Sinus rhythm.   Assessment/Plan:   1. Acute pancreatitis  - Presents with 4 days of severe epigastric pain with N/V and is found to have lipase of 5738  - Biliary ductal dilatation noted on CT  - She denies EtOH use and this is likely biliary  - Check LFTs and MRCP, continue pain-control, continue bowel-rest, continue IVF hydration    2. Hypokalemia  - Serum potassium is 2.9, likely secondary to GI-losses  - Replace potassium, repeat chemistries in am    3. Mild renal insufficiency  - SCr is 1.18 in ED, up from 1.04 last month  - Continue IVF hydration while NPO, monitor   4. Hypertensive urgency  - BP 210/90 in ED  - Pain likely contributing  - Continue pain-control, use labetalol as needed   5. Elevated troponin - Troponin mildly elevated without anginal complaint or acute ischemic changes on EKG  - Troponin elevation likely related to acute pancreatitis and  severe HTN rather than ACS  - Continue to treat underlying conditions as above    6. Right breast nodule  - Noted incidentally on CT in ED  - Outpatient mammogram recommended    DVT prophylaxis: SCDs  Code Status: Full  Family Communication: Husband updated at bedside Disposition Plan:  Patient is from: Home  Anticipated d/c is to: Home  Anticipated d/c date is: 04/03/20 Patient currently: Pending MRCP and pain-control  Consults called: None  Admission status: Inpatient     Vianne Bulls, MD [facility] Pager: See [email or web address]  If 7AM-7PM, please contact the daytime attending [email or web address]  04/01/2020, 1:04 AM

## 2020-04-02 LAB — COMPREHENSIVE METABOLIC PANEL
ALT: 206 U/L — ABNORMAL HIGH (ref 0–44)
AST: 157 U/L — ABNORMAL HIGH (ref 15–41)
Albumin: 3.2 g/dL — ABNORMAL LOW (ref 3.5–5.0)
Alkaline Phosphatase: 147 U/L — ABNORMAL HIGH (ref 38–126)
Anion gap: 8 (ref 5–15)
BUN: 9 mg/dL (ref 8–23)
CO2: 23 mmol/L (ref 22–32)
Calcium: 8.6 mg/dL — ABNORMAL LOW (ref 8.9–10.3)
Chloride: 106 mmol/L (ref 98–111)
Creatinine, Ser: 0.72 mg/dL (ref 0.44–1.00)
GFR calc Af Amer: >60 mL/min (ref >60)
GFR calc non Af Amer: >60 mL/min (ref >60)
Glucose, Bld: 102 mg/dL — ABNORMAL HIGH (ref 70–99)
Potassium: 3.3 mmol/L — ABNORMAL LOW (ref 3.5–5.1)
Sodium: 137 mmol/L (ref 135–145)
Total Bilirubin: 1.1 mg/dL (ref 0.3–1.2)
Total Protein: 6.5 g/dL (ref 6.5–8.1)

## 2020-04-02 LAB — CBC WITH DIFFERENTIAL/PLATELET
Abs Immature Granulocytes: 0.04 10*3/uL (ref 0.00–0.07)
Basophils Absolute: 0 10*3/uL (ref 0.0–0.1)
Basophils Relative: 0 %
Eosinophils Absolute: 0 10*3/uL (ref 0.0–0.5)
Eosinophils Relative: 0 %
HCT: 32.2 % — ABNORMAL LOW (ref 36.0–46.0)
Hemoglobin: 10.6 g/dL — ABNORMAL LOW (ref 12.0–15.0)
Immature Granulocytes: 0 %
Lymphocytes Relative: 9 %
Lymphs Abs: 1.5 10*3/uL (ref 0.7–4.0)
MCH: 32 pg (ref 26.0–34.0)
MCHC: 32.9 g/dL (ref 30.0–36.0)
MCV: 97.3 fL (ref 80.0–100.0)
Monocytes Absolute: 1.5 10*3/uL — ABNORMAL HIGH (ref 0.1–1.0)
Monocytes Relative: 9 %
Neutro Abs: 12.9 10*3/uL — ABNORMAL HIGH (ref 1.7–7.7)
Neutrophils Relative %: 82 %
Platelets: 251 10*3/uL (ref 150–400)
RBC: 3.31 MIL/uL — ABNORMAL LOW (ref 3.87–5.11)
RDW: 13.5 % (ref 11.5–15.5)
WBC: 16 10*3/uL — ABNORMAL HIGH (ref 4.0–10.5)
nRBC: 0 % (ref 0.0–0.2)

## 2020-04-02 LAB — LIPASE, BLOOD: Lipase: 203 U/L — ABNORMAL HIGH (ref 11–51)

## 2020-04-02 MED ORDER — METOPROLOL SUCCINATE ER 50 MG PO TB24
50.0000 mg | ORAL_TABLET | Freq: Every day | ORAL | Status: DC
  Administered 2020-04-02 – 2020-04-03 (×2): 50 mg via ORAL
  Filled 2020-04-02 (×2): qty 1

## 2020-04-02 NOTE — Progress Notes (Signed)
PROGRESS NOTE    Robin Arellano  [id number] DOB: 05/08/1951 DOA: 03/31/2020 PCP: Sandi Mariscal, MD   Brief Narrative: Patient is a 69 year old female with history of hypertension, anxiety, hyperlipidemia, fibromyalgia who presented to the emergency room with complaint of severe epigastric pain, nausea and vomiting.  She describes the pain in the epigastric region accompanied by nausea and vomiting.  No history of alcohol intake.  No history of hepatobiliary disease.  On presentation she was hypertensive hypokalemic, had mild AKI.  Lipase was elevated to 5738.  She had leukocytosis.  CT abdomen/pelvis showed distended gallbladder with intra and extrahepatic biliary duct dilation, mild dilation of distal pancreatic duct, right breast nodule.  Started on pain management, IV fluids.  GI following.  Underwent  MRCP with finding of dilated CBD.there is suspicion for choledocyst.  GI planning for endoscopic ultrasound/ERCP.  Continue with bowel rest and IV fluids.  Assessment & Plan:   Principal Problem:   Acute pancreatitis Active Problems:   Coronary artery calcification of native artery   Anxiety   Hypokalemia   Mild renal insufficiency   Breast nodule   Elevated troponin   Elevated liver enzymes   Abnormal magnetic resonance imaging of abdomen   Acute pancreatitis: Presented with 4 days history of severe epigastric pain with nausea and vomiting.  Lipase of 5738 on presentation.  CT abdomen/pelvis showed gallbladder distention, extra and intrahepatic bile duct dilation.  Mild dilation of distal pancreatic duct.   Elevated liver enzymes ,no history of ethanol use.  Most likely biliary etiology.  LFTs improving.  MRCP showed diffuse edema of the pancreas, fusiform dilation of CBD measuring 1.5 cm, no choledocholithiasis, distended gallbladder with mild gallbladder thickening, no gallstones. GI, general surgery consulted. There is suspicion for choledocyst.  GI planning for endoscopic  ultrasound/ERCP. Continue pain management, bowel rest, IV fluid hydration. GI also recommended to discontinue Zetia and  Crestor,.  Hypokalemia: Being aggressively supplemented.  Levels will be monitored.  AKI: Resolved with IV fluids  Hypertensive urgency: Blood pressure in the range of 200s on presentation.  Most likely associated with pain.  Continue pain control, labetalol as needed.  We resumed  her home medications.  She takes amlodipine, metoprolol at home.  Dose of metoprolol increased.  Elevated troponin: Mildly elevated troponin.  No acute ischemic changes on EKG.  Denies any chest pain.  No further investigation.  Right breast nodule: Noted incidentally on CT.  Outpatient mammogram recommended.  Nutrition Problem: Increased nutrient needs Etiology: acute illness(pancreatitis)      DVT prophylaxis:SCD Code Status: Full Family Communication: Sister was at the bedside Status is: Inpatient  Remains inpatient appropriate because:Ongoing diagnostic testing needed not appropriate for outpatient work up   Dispo: The patient is from: Home              Anticipated d/c is to: Home              Anticipated d/c date is: 2 days              Patient currently is not medically stable to d/c. Needs GI clearance for discharge. Still has abdominal pain and is n.p.o. GI is planning for possible EUS/ERCP.   Consultants: GI  Procedures:None  Antimicrobials:  Anti-infectives (From admission, onward)   None      Subjective: Patient seen and examined the bedside this morning. Abdomen pain is better than yesterday. More comfortable today. Had a vomiting episode last night.  Objective: Vitals:   04/01/20 1302 04/01/20 1500  04/01/20 2246 04/02/20 0418  BP: (!) 182/71 (!) 153/78 (!) 184/87 (!) 184/80  Pulse: 78 68 85 84  Resp: 20 20 20  (!) 22  Temp: 98.5 F (36.9 C)  98.8 F (37.1 C) 98.7 F (37.1 C)  TempSrc:   Oral Oral  SpO2: 96%  (!) 89% 97%  Weight:      Height:         Intake/Output Summary (Last 24 hours) at 04/02/2020 [id number] Last data filed at 04/01/2020 1752 Gross per 24 hour  Intake 1294.65 ml  Output --  Net 1294.65 ml   Filed Weights   04/01/20 0246  Weight: 76.9 kg    Examination:  General exam: Not in acute distress Respiratory system: Bilateral equal air entry, normal vesicular breath sounds, no wheezes or crackles  Cardiovascular system: S1 & S2 heard, RRR. No JVD, murmurs, rubs, gallops or clicks. Gastrointestinal system: Abdomen is nondistended, soft and tender in the epigastric region. No organomegaly or masses felt. Normal bowel sounds heard. Central nervous system: Alert and oriented. No focal neurological deficits. Extremities: No edema, no clubbing ,no cyanosis, distal peripheral pulses palpable. Skin: No rashes, lesions or ulcers,no icterus ,no pallor   Data Reviewed: I have personally reviewed following labs and imaging studies  CBC: Recent Labs  Lab 03/31/20 2051 04/01/20 0334 04/02/20 0425  WBC 17.6* 13.5* 16.0*  NEUTROABS  --  10.6* 12.9*  HGB 12.4 10.6* 10.6*  HCT 36.4 31.6* 32.2*  MCV 96.6 96.6 97.3  PLT 280 241 [id number]   Basic Metabolic Panel: Recent Labs  Lab 03/31/20 2051 04/01/20 0334 04/02/20 0425  NA 138 134* 137  K 2.9* 3.0* 3.3*  CL 103 103 106  CO2 24 22 23   GLUCOSE 136* 110* 102*  BUN 23 15 9   CREATININE 1.18* 0.79 0.72  CALCIUM 9.5 8.3* 8.6*  MG  --  2.1  --    GFR: Estimated Creatinine Clearance: 63.7 mL/min (by C-G formula based on SCr of 0.72 mg/dL). Liver Function Tests: Recent Labs  Lab 03/30/20 2051 04/01/20 0334 04/02/20 0425  AST 897* 558* 157*  ALT 389* 311* 206*  ALKPHOS 187* 152* 147*  BILITOT 1.6* 1.2 1.1  PROT 7.9 6.5 6.5  ALBUMIN 4.2 3.5 3.2*   Recent Labs  Lab 03/31/20 2051 04/01/20 0334 04/02/20 0425  LIPASE 5,738* 1,724* 203*   No results for input(s): AMMONIA in the last 168 hours. Coagulation Profile: No results for input(s): INR, PROTIME in the last  168 hours. Cardiac Enzymes: No results for input(s): CKTOTAL, CKMB, CKMBINDEX, TROPONINI in the last 168 hours. BNP (last 3 results) No results for input(s): PROBNP in the last 8760 hours. HbA1C: No results for input(s): HGBA1C in the last 72 hours. CBG: No results for input(s): GLUCAP in the last 168 hours. Lipid Profile: No results for input(s): CHOL, HDL, LDLCALC, TRIG, CHOLHDL, LDLDIRECT in the last 72 hours. Thyroid Function Tests: No results for input(s): TSH, T4TOTAL, FREET4, T3FREE, THYROIDAB in the last 72 hours. Anemia Panel: No results for input(s): VITAMINB12, FOLATE, FERRITIN, TIBC, IRON, RETICCTPCT in the last 72 hours. Sepsis Labs: No results for input(s): PROCALCITON, LATICACIDVEN in the last 168 hours.  Recent Results (from the past 240 hour(s))  SARS Coronavirus 2 by RT PCR (hospital order, performed in [facility] hospital lab) Nasopharyngeal Nasopharyngeal Swab     Status: None   Collection Time: 04/01/20 12:46 AM   Specimen: Nasopharyngeal Swab  Result Value Ref Range Status   SARS Coronavirus 2 NEGATIVE NEGATIVE Final  Comment: (NOTE) SARS-CoV-2 target nucleic acids are NOT DETECTED. The SARS-CoV-2 RNA is generally detectable in upper and lower respiratory specimens during the acute phase of infection. The lowest concentration of SARS-CoV-2 viral copies this assay can detect is 250 copies / mL. A negative result does not preclude SARS-CoV-2 infection and should not be used as the sole basis for treatment or other patient management decisions.  A negative result may occur with improper specimen collection / handling, submission of specimen other than nasopharyngeal swab, presence of viral mutation(s) within the areas targeted by this assay, and inadequate number of viral copies (<250 copies / mL). A negative result must be combined with clinical observations, patient history, and epidemiological information. Fact Sheet for Patients:    StrictlyIdeas.no Fact Sheet for Healthcare Providers: BankingDealers.co.za This test is not yet approved or cleared  by the [place] FDA and has been authorized for detection and/or diagnosis of SARS-CoV-2 by FDA under an Emergency Use Authorization (EUA).  This EUA will remain in effect (meaning this test can be used) for the duration of the COVID-19 declaration under Section 564(b)(1) of the Act, 21 U.S.C. section 360bbb-3(b)(1), unless the authorization is terminated or revoked sooner. Performed at [facility], [place] [address], [place], [place] [address]          Radiology Studies: CT ABDOMEN PELVIS W CONTRAST  Result Date: 04/01/2020 CLINICAL DATA:  Pancreatitis. Elevated lipase. EXAM: CT ABDOMEN AND PELVIS WITH CONTRAST TECHNIQUE: Multidetector CT imaging of the abdomen and pelvis was performed using the standard protocol following bolus administration of intravenous contrast. CONTRAST:  38mL OMNIPAQUE IOHEXOL 300 MG/ML  SOLN COMPARISON:  02/17/2018 FINDINGS: Lower chest: Lungs are clear. Heart size is mildly enlarged. There is a right-sided breast nodule measuring approximately 1.8 cm (increased in size from prior study in 2019). Hepatobiliary: The liver is normal. The gallbladder is distended.There is intrahepatic and extrahepatic biliary ductal dilatation. Pancreas: Pancreatic duct is mildly dilated distally. There are no significant peripancreatic inflammatory changes. Spleen: Unremarkable. Adrenals/Urinary Tract: --Adrenal glands: Unremarkable. --Right kidney/ureter: No hydronephrosis or radiopaque kidney stones. --Left kidney/ureter: There is atrophy of the upper pole the left kidney. There is no hydronephrosis. --Urinary bladder: Unremarkable. Stomach/Bowel: --Stomach/Duodenum: No hiatal hernia or other gastric abnormality. Normal duodenal course and caliber. --Small bowel: Unremarkable. --Colon: Rectosigmoid  diverticulosis without acute inflammation. --Appendix: Normal. Vascular/Lymphatic: Atherosclerotic calcification is present within the non-aneurysmal abdominal aorta, without hemodynamically significant stenosis. --No retroperitoneal lymphadenopathy. --No mesenteric lymphadenopathy. --No pelvic or inguinal lymphadenopathy. Reproductive: Status post hysterectomy. No adnexal mass. Other: No ascites or free air. The abdominal wall is normal. Musculoskeletal. No acute displaced fractures. IMPRESSION: 1. Distended gallbladder with intrahepatic and extrahepatic biliary ductal dilatation. Pancreatic duct is mildly dilated distally. No significant peripancreatic inflammatory changes. Correlation with laboratory studies is recommended. Follow-up with MRCP/ERCP is recommended. 2. Rectosigmoid diverticulosis without acute inflammation. 3. Right-sided breast nodule measuring approximately 1.8 cm (increased in size from prior study in 2019). Correlation with outpatient mammography is recommended. Aortic Atherosclerosis (ICD10-I70.0). Electronically Signed   By: Constance Holster M.D.   On: 04/01/2020 00:12   MR 3D Recon At Scanner  Result Date: 04/01/2020 CLINICAL DATA:  Evaluate for pancreatitis. EXAM: MRI ABDOMEN WITHOUT AND WITH CONTRAST (INCLUDING MRCP) TECHNIQUE: Multiplanar multisequence MR imaging of the abdomen was performed both before and after the administration of intravenous contrast. Heavily T2-weighted images of the biliary and pancreatic ducts were obtained, and three-dimensional MRCP images were rendered by post processing. CONTRAST:  45mL GADAVIST GADOBUTROL 1  MMOL/ML IV SOLN COMPARISON:  03/31/2020 FINDINGS: Lower chest: No acute findings. Hepatobiliary: No suspicious liver lesions. Distended gallbladder with mild gallbladder wall thickening. No gallstones. Mild intrahepatic biliary ductal dilatation. Fusiform dilatation of the CBD is noted which has a maximum diameter of 1.5 cm. No choledocholithiasis  identified Pancreas: Mild diffuse edema of the pancreas noted. Peripancreatic fluid surrounds the head of pancreas and duodenum. No mass identified. No main duct dilatation. No findings of pancreatic necrosis or pseudocyst formation. Spleen:  Within normal limits in size and appearance. Adrenals/Urinary Tract: No masses identified. No evidence of hydronephrosis. There is mild asymmetric left kidney atrophy. Stomach/Bowel: Visualized portions within the abdomen are unremarkable. Vascular/Lymphatic: Extensive aortic atherosclerosis. No aneurysm. The portal vein and splenic vein and portal venous confluence remain patent. Other:  None. Musculoskeletal: No suspicious bone lesions identified. IMPRESSION: 1. Mild diffuse edema of the pancreas compatible with acute pancreatitis. No findings of pancreatic necrosis or pseudocyst formation. 2. Fusiform dilatation of the CBD which has a maximum diameter of 1.5 cm. No choledocholithiasis identified. 3. Distended gallbladder with mild gallbladder wall thickening. No gallstones. Electronically Signed   By: Kerby Moors M.D.   On: 04/01/2020 09:23   MR ABDOMEN MRCP W WO CONTAST  Result Date: 04/01/2020 CLINICAL DATA:  Evaluate for pancreatitis. EXAM: MRI ABDOMEN WITHOUT AND WITH CONTRAST (INCLUDING MRCP) TECHNIQUE: Multiplanar multisequence MR imaging of the abdomen was performed both before and after the administration of intravenous contrast. Heavily T2-weighted images of the biliary and pancreatic ducts were obtained, and three-dimensional MRCP images were rendered by post processing. CONTRAST:  26mL GADAVIST GADOBUTROL 1 MMOL/ML IV SOLN COMPARISON:  03/31/2020 FINDINGS: Lower chest: No acute findings. Hepatobiliary: No suspicious liver lesions. Distended gallbladder with mild gallbladder wall thickening. No gallstones. Mild intrahepatic biliary ductal dilatation. Fusiform dilatation of the CBD is noted which has a maximum diameter of 1.5 cm. No choledocholithiasis  identified Pancreas: Mild diffuse edema of the pancreas noted. Peripancreatic fluid surrounds the head of pancreas and duodenum. No mass identified. No main duct dilatation. No findings of pancreatic necrosis or pseudocyst formation. Spleen:  Within normal limits in size and appearance. Adrenals/Urinary Tract: No masses identified. No evidence of hydronephrosis. There is mild asymmetric left kidney atrophy. Stomach/Bowel: Visualized portions within the abdomen are unremarkable. Vascular/Lymphatic: Extensive aortic atherosclerosis. No aneurysm. The portal vein and splenic vein and portal venous confluence remain patent. Other:  None. Musculoskeletal: No suspicious bone lesions identified. IMPRESSION: 1. Mild diffuse edema of the pancreas compatible with acute pancreatitis. No findings of pancreatic necrosis or pseudocyst formation. 2. Fusiform dilatation of the CBD which has a maximum diameter of 1.5 cm. No choledocholithiasis identified. 3. Distended gallbladder with mild gallbladder wall thickening. No gallstones. Electronically Signed   By: Kerby Moors M.D.   On: 04/01/2020 09:23        Scheduled Meds: . amLODipine  10 mg Oral Daily  . ketorolac  15 mg Intravenous Once  . metoprolol succinate  50 mg Oral Daily  . sodium chloride flush  3 mL Intravenous Q12H   Continuous Infusions: . sodium chloride 125 mL/hr at 04/02/20 0336     LOS: 1 day    Time spent: 25 mins.More than 50% of that time was spent in counseling and/or coordination of care.      Shelly Coss, MD [facility] P5/22/2021, 8:03 AM

## 2020-04-02 NOTE — Plan of Care (Signed)

## 2020-04-02 NOTE — Progress Notes (Signed)
[facility] Progress Note    Since last GI note: She is still very uncomfortable with abd pains. + flatus.  She would like to try clears  zetia and crestor both list pancreatitis as potential severe reactions, she has been on them both for about 1 year.  Objective: Vital signs in last 24 hours: Temp:  [98.5 F (36.9 C)-99.2 F (37.3 C)] 98.7 F (37.1 C) (05/22 0418) Pulse Rate:  [68-85] 84 (05/22 0418) Resp:  [20-22] 22 (05/22 0418) BP: (153-193)/(71-88) 184/80 (05/22 0418) SpO2:  [88 %-97 %] 97 % (05/22 0418) Last BM Date: 03/31/20 General: alert and oriented times 3 Heart: regular rate and rythm Abdomen: soft, moderately tender throughout. non-distended, normal bowel sounds   Lab Results: Recent Labs    03/31/20 2051 04/01/20 0334 04/02/20 0425  WBC 17.6* 13.5* 16.0*  HGB 12.4 10.6* 10.6*  PLT 280 241 251  MCV 96.6 96.6 97.3   Recent Labs    03/31/20 2051 04/01/20 0334 04/02/20 0425  NA 138 134* 137  K 2.9* 3.0* 3.3*  CL 103 103 106  CO2 24 22 23   GLUCOSE 136* 110* 102*  BUN 23 15 9   CREATININE 1.18* 0.79 0.72  CALCIUM 9.5 8.3* 8.6*   Recent Labs    03/30/20 2051 04/01/20 0334 04/02/20 0425  PROT 7.9 6.5 6.5  ALBUMIN 4.2 3.5 3.2*  AST 897* 558* 157*  ALT 389* 311* 206*  ALKPHOS 187* 152* 147*  BILITOT 1.6* 1.2 1.1  BILIDIR 1.0*  --   --   IBILI 0.6  --   --     Medications: Scheduled Meds: . amLODipine  10 mg Oral Daily  . ketorolac  15 mg Intravenous Once  . metoprolol succinate  50 mg Oral Daily  . sodium chloride flush  3 mL Intravenous Q12H   Continuous Infusions: . sodium chloride 125 mL/hr at 04/02/20 0336   PRN Meds:.butalbital-acetaminophen-caffeine, HYDROmorphone (DILAUDID) injection, labetalol, lip balm, LORazepam, promethazine    Assessment/Plan: 69 y.o. female with acute pancreatitsi, dilated bile duct  The dilated bile duct may be a clue to the cause of her acute pancreaitits (Choledochole cyst?  Microliathisis?).  Certainly that should be looked into further but during her acute illness is not the right time.  The dilated bile duct may alternatively be the result of her acute pancreatitis. Zetia and crestor both list pancreatitis and potential side effects.  Should consider stopping those.  Regardless, she needs usual concervative treatment for her AP. I am going to increase her fluids and her urine is still pretty concentrated.  Fortunately her iver tests are significantly improving over the past 2 days (without antibiotics which is reassuring that she has no biliary infection.  Will follow along.  Stay NPO except sips h2o for nor.  Milus Banister, MD  04/02/2020, 9:33 AM [facility] Pager [phone]

## 2020-04-03 DIAGNOSIS — R945 Abnormal results of liver function studies: Secondary | ICD-10-CM

## 2020-04-03 DIAGNOSIS — D649 Anemia, unspecified: Secondary | ICD-10-CM

## 2020-04-03 DIAGNOSIS — N649 Disorder of breast, unspecified: Secondary | ICD-10-CM

## 2020-04-03 DIAGNOSIS — R7989 Other specified abnormal findings of blood chemistry: Secondary | ICD-10-CM

## 2020-04-03 DIAGNOSIS — I2510 Atherosclerotic heart disease of native coronary artery without angina pectoris: Secondary | ICD-10-CM

## 2020-04-03 LAB — CBC WITH DIFFERENTIAL/PLATELET
Abs Immature Granulocytes: 0.03 10*3/uL (ref 0.00–0.07)
Basophils Absolute: 0.1 10*3/uL (ref 0.0–0.1)
Basophils Relative: 1 %
Eosinophils Absolute: 0.1 10*3/uL (ref 0.0–0.5)
Eosinophils Relative: 1 %
HCT: 28.9 % — ABNORMAL LOW (ref 36.0–46.0)
Hemoglobin: 9.3 g/dL — ABNORMAL LOW (ref 12.0–15.0)
Immature Granulocytes: 0 %
Lymphocytes Relative: 20 %
Lymphs Abs: 2.3 10*3/uL (ref 0.7–4.0)
MCH: 31.7 pg (ref 26.0–34.0)
MCHC: 32.2 g/dL (ref 30.0–36.0)
MCV: 98.6 fL (ref 80.0–100.0)
Monocytes Absolute: 1.3 10*3/uL — ABNORMAL HIGH (ref 0.1–1.0)
Monocytes Relative: 11 %
Neutro Abs: 7.7 10*3/uL (ref 1.7–7.7)
Neutrophils Relative %: 67 %
Platelets: 219 10*3/uL (ref 150–400)
RBC: 2.93 MIL/uL — ABNORMAL LOW (ref 3.87–5.11)
RDW: 13.5 % (ref 11.5–15.5)
WBC: 11.5 10*3/uL — ABNORMAL HIGH (ref 4.0–10.5)
nRBC: 0 % (ref 0.0–0.2)

## 2020-04-03 LAB — VITAMIN B12: Vitamin B-12: 277 pg/mL (ref 180–914)

## 2020-04-03 LAB — COMPREHENSIVE METABOLIC PANEL
ALT: 125 U/L — ABNORMAL HIGH (ref 0–44)
AST: 59 U/L — ABNORMAL HIGH (ref 15–41)
Albumin: 3 g/dL — ABNORMAL LOW (ref 3.5–5.0)
Alkaline Phosphatase: 124 U/L (ref 38–126)
Anion gap: 10 (ref 5–15)
BUN: 8 mg/dL (ref 8–23)
CO2: 23 mmol/L (ref 22–32)
Calcium: 8.5 mg/dL — ABNORMAL LOW (ref 8.9–10.3)
Chloride: 107 mmol/L (ref 98–111)
Creatinine, Ser: 0.84 mg/dL (ref 0.44–1.00)
GFR calc Af Amer: >60 mL/min (ref >60)
GFR calc non Af Amer: >60 mL/min (ref >60)
Glucose, Bld: 88 mg/dL (ref 70–99)
Potassium: 2.8 mmol/L — ABNORMAL LOW (ref 3.5–5.1)
Sodium: 140 mmol/L (ref 135–145)
Total Bilirubin: 0.8 mg/dL (ref 0.3–1.2)
Total Protein: 6.2 g/dL — ABNORMAL LOW (ref 6.5–8.1)

## 2020-04-03 LAB — IRON AND TIBC
Iron: 31 ug/dL (ref 28–170)
Saturation Ratios: 12 % (ref 10.4–31.8)
TIBC: 251 ug/dL (ref 250–450)
UIBC: 220 ug/dL

## 2020-04-03 LAB — FERRITIN: Ferritin: 192 ng/mL (ref 11–307)

## 2020-04-03 LAB — FOLATE: Folate: 18 ng/mL (ref >5.9)

## 2020-04-03 MED ORDER — CLONIDINE HCL 0.1 MG/24HR TD PTWK
0.1000 mg | MEDICATED_PATCH | TRANSDERMAL | Status: DC
  Filled 2020-04-03: qty 1

## 2020-04-03 MED ORDER — NALOXONE HCL 0.4 MG/ML IJ SOLN: 0.4000 mg | INTRAMUSCULAR | Status: DC | PRN

## 2020-04-03 MED ORDER — LISINOPRIL 5 MG PO TABS
5.0000 mg | ORAL_TABLET | Freq: Every day | ORAL | Status: DC
  Administered 2020-04-03 – 2020-04-08 (×6): 5 mg via ORAL
  Filled 2020-04-03 (×6): qty 1

## 2020-04-03 MED ORDER — CAPSAICIN 0.075 % EX CREA
TOPICAL_CREAM | Freq: Two times a day (BID) | CUTANEOUS | Status: DC
  Administered 2020-04-06: 1 via TOPICAL
  Filled 2020-04-03 (×2): qty 60

## 2020-04-03 MED ORDER — ONDANSETRON HCL 4 MG/2ML IJ SOLN
4.0000 mg | Freq: Four times a day (QID) | INTRAMUSCULAR | Status: DC | PRN
  Administered 2020-04-04 – 2020-04-05 (×4): 4 mg via INTRAVENOUS
  Filled 2020-04-03 (×4): qty 2

## 2020-04-03 MED ORDER — SODIUM CHLORIDE 0.9% FLUSH: 9.0000 mL | INTRAVENOUS | Status: DC | PRN

## 2020-04-03 MED ORDER — DIPHENHYDRAMINE HCL 12.5 MG/5ML PO ELIX
12.5000 mg | ORAL_SOLUTION | Freq: Four times a day (QID) | ORAL | Status: DC | PRN
  Administered 2020-04-05: 12.5 mg via ORAL
  Filled 2020-04-03: qty 5

## 2020-04-03 MED ORDER — POTASSIUM CHLORIDE IN NACL 20-0.9 MEQ/L-% IV SOLN
INTRAVENOUS | Status: DC
  Filled 2020-04-03 (×8): qty 1000

## 2020-04-03 MED ORDER — METOPROLOL SUCCINATE ER 50 MG PO TB24
50.0000 mg | ORAL_TABLET | Freq: Every day | ORAL | Status: DC
  Administered 2020-04-04 – 2020-04-08 (×5): 50 mg via ORAL
  Filled 2020-04-03 (×5): qty 1

## 2020-04-03 MED ORDER — METOPROLOL SUCCINATE ER 100 MG PO TB24: 100.0000 mg | ORAL_TABLET | Freq: Every day | ORAL | Status: DC

## 2020-04-03 MED ORDER — DIPHENHYDRAMINE HCL 50 MG/ML IJ SOLN: 12.5000 mg | Freq: Four times a day (QID) | INTRAMUSCULAR | Status: DC | PRN

## 2020-04-03 MED ORDER — HYDROMORPHONE 1 MG/ML IV SOLN
INTRAVENOUS | Status: DC
  Administered 2020-04-03: 0.5 mg via INTRAVENOUS
  Administered 2020-04-03: 1.2 mg via INTRAVENOUS
  Administered 2020-04-03: 0.8 mg via INTRAVENOUS
  Administered 2020-04-03: 1.5 mg via INTRAVENOUS
  Administered 2020-04-03: 30 mg via INTRAVENOUS
  Administered 2020-04-04: 1.5 mg via INTRAVENOUS
  Administered 2020-04-04 (×2): 2.4 mg via INTRAVENOUS
  Administered 2020-04-04: 3.4 mg via INTRAVENOUS
  Administered 2020-04-04: 0.5 mg via INTRAVENOUS
  Administered 2020-04-05: 2.4 mg via INTRAVENOUS
  Administered 2020-04-05 (×2): 2.7 mg via INTRAVENOUS
  Administered 2020-04-05: 0.3 mg via INTRAVENOUS
  Administered 2020-04-05: 30 mg via INTRAVENOUS
  Administered 2020-04-05: 4.5 mg via INTRAVENOUS
  Administered 2020-04-05: 3 mg via INTRAVENOUS
  Administered 2020-04-06: 0.6 mg via INTRAVENOUS
  Administered 2020-04-06: 2 mg via INTRAVENOUS
  Administered 2020-04-06: 2.3 mg via INTRAVENOUS
  Administered 2020-04-06: 4.2 mg via INTRAVENOUS
  Administered 2020-04-06: 1.5 mg via INTRAVENOUS
  Administered 2020-04-07: 2.4 mg via INTRAVENOUS
  Administered 2020-04-07: 30 mg via INTRAVENOUS
  Filled 2020-04-03 (×3): qty 30

## 2020-04-03 NOTE — Progress Notes (Signed)
[facility] Progress Note    Since last GI note: IV fluids increased yesterday, remained NPO except sips H20.   She is still very uncomfortable with abd pains.  Objective: Vital signs in last 24 hours: Temp:  [98.6 F (37 C)-100.4 F (38 C)] 98.8 F (37.1 C) (05/23 0544) Pulse Rate:  [74-80] 74 (05/23 0544) Resp:  [16-20] 16 (05/23 0544) BP: (160-172)/(70-79) 162/79 (05/23 0544) SpO2:  [93 %-96 %] 96 % (05/23 0544) Last BM Date: 04/02/20 General: alert and oriented times 3 Heart: regular rate and rythm Abdomen: soft, moderately tender throughout, non-distended, normal bowel sounds   Lab Results: Recent Labs    04/01/20 0334 04/02/20 0425 04/03/20 0327  WBC 13.5* 16.0* 11.5*  HGB 10.6* 10.6* 9.3*  PLT 241 251 219  MCV 96.6 97.3 98.6   Recent Labs    04/01/20 0334 04/02/20 0425 04/03/20 0327  NA 134* 137 140  K 3.0* 3.3* 2.8*  CL 103 106 107  CO2 22 23 23   GLUCOSE 110* 102* 88  BUN 15 9 8   CREATININE 0.79 0.72 0.84  CALCIUM 8.3* 8.6* 8.5*   Recent Labs    04/01/20 0334 04/02/20 0425 04/03/20 0327  PROT 6.5 6.5 6.2*  ALBUMIN 3.5 3.2* 3.0*  AST 558* 157* 59*  ALT 311* 206* 125*  ALKPHOS 152* 147* 124  BILITOT 1.2 1.1 0.8    Medications: Scheduled Meds: . amLODipine  10 mg Oral Daily  . ketorolac  15 mg Intravenous Once  . metoprolol succinate  50 mg Oral Daily  . sodium chloride flush  3 mL Intravenous Q12H   Continuous Infusions: . sodium chloride 150 mL/hr at 04/03/20 0721   PRN Meds:.butalbital-acetaminophen-caffeine, HYDROmorphone (DILAUDID) injection, labetalol, lip balm, LORazepam, promethazine   Assessment/Plan: 69 y.o. female with acute pancreatitis  No gallstones in her GB however LFTs were clearly elevated at admission and her CBD is dilated.  It is not clear if the dilated CBD is the result of the pancreatitis or a clue to it's cause. She will need further evaluation of the bile duct (repeat MR, EUS?) however I still  think it is best that her AP related inflammation significantly improve first.  I see that Acute viral hepatitis panel has not been checked and although it seems unlikely I am ordering one to be done in the AM tomorrow. LFTs continue to quickly improve fortunately.  Her pain is not well controlled. I recommend a PCA pump.  I will order strict I/Os.  Currently IV fluids at 150/hour and her WBC is decreasing so no changed in IV fluids for now.  NPO except sips H20, she's been drinking some sips of Sprite and that is not causing any worsening of her pains or N/V.    Milus Banister, MD  04/03/2020, 8:01 AM [facility] Pager [phone]

## 2020-04-03 NOTE — Plan of Care (Signed)

## 2020-04-03 NOTE — Progress Notes (Signed)
Patient ID: Robin Arellano, female   DOB: 1950-11-22, 69 y.o.   MRN: [id number]  PROGRESS NOTE    Robin Arellano  [id number] DOB: Nov 03, 1951 DOA: 03/31/2020 PCP: Sandi Mariscal, MD    Brief Narrative:  Patient is a 69 year old female with history of hypertension, anxiety, hyperlipidemia, fibromyalgia who presented to the emergency room with complaint of severe epigastric pain, nausea and vomiting.  She describes the pain in the epigastric region accompanied by nausea and vomiting.  No history of alcohol intake.  No history of hepatobiliary disease.  On presentation she was hypertensive hypokalemic, had mild AKI.  Lipase was elevated to 5738.  She had leukocytosis.  CT abdomen/pelvis showed distended gallbladder with intra and extrahepatic biliary duct dilation, mild dilation of distal pancreatic duct, right breast nodule.  Started on pain management, IV fluids.  GI following.  Underwent  MRCP with finding of dilated CBD.there is suspicion for choledocyst.  GI planning for endoscopic ultrasound/ERCP.  Continue with bowel rest and IV fluids.   Assessment & Plan:   Principal Problem:   Acute pancreatitis Active Problems:   Coronary artery calcification of native artery   Anxiety   Hypokalemia   Mild renal insufficiency   Breast nodule   Elevated troponin   Elevated liver enzymes   Abnormal magnetic resonance imaging of abdomen  Acute pancreatitis: Presented with 4 days history of severe epigastric pain with nausea and vomiting.  Lipase of 5738 on presentation.  CT abdomen/pelvis showed gallbladder distention, extra and intrahepatic bile duct dilation.  Mild dilation of distal pancreatic duct.   Elevated liver enzymes ,no history of ethanol use.  Most likely biliary etiology.  LFTs improving.  MRCP showed diffuse edema of the pancreas, fusiform dilation of CBD measuring 1.5 cm, no choledocholithiasis, distended gallbladder with mild gallbladder thickening, no gallstones. GI, general surgery  consulted-Gen surg deferred to GI as no gall stones. There is suspicion for choledochocyst.  GI planning for endoscopic ultrasound/ERCP once inflammation is resolved. Continue pain management, bowel rest, IV fluid hydration. Today still with nausea, though no emesis. GI also recommended to discontinue Zetia and Crestor---though has significant hypercholesterolemia and on-going need for treatment of CAD--last cath showed 5-25% and h/o heart attack. Discussed with Cardiology--they recommend outpt. F/u for meds adjustment.  Hypokalemia: Being aggressively supplemented.  Levels will be monitored.--Added to IVF today  AKI: Resolved with IV fluids  Hypertensive urgency: Blood pressure in the range of 200s on presentation.  Most likely associated with pain.  Continue pain control, labetalol as needed.  We resumed  her home medications.  She takes amlodipine, metoprolol at home. Metoprolol dose increased. Reports significant fatigue with B-blocker--Max dose of CCB. ACE-I added today--monitor kidney function  Elevated troponin: Mildly elevated troponin.  No acute ischemic changes on EKG.  Denies any chest pain.  No further investigation. GI also recommended to discontinue Zetia and Crestor---though has significant hypercholesterolemia and on-going need for treatment of CAD--last cath showed 5-25% and h/o heart attack. Discussed with Cardiology--they recommend outpt. F/u for meds adjustment.  Anemia: Has crept down since admission. Iron studies, B12 and Folate sent.  Right breast nodule: Noted incidentally on CT.  Outpatient mammogram recommended.  Nutrition Problem: Increased nutrient needs Etiology: acute illness(pancreatitis)   DVT prophylaxis: SCD/Compression stockings Code Status: Full code  Family Communication: Sister and Daughter at bedside Disposition Plan: Home   Consultants:   GI  General surgery  Procedures:  none  Antimicrobials: Anti-infectives (From admission, onward)    None  Subjective: Still with lots of nausea. No emesis. BP is worse.  Objective: Vitals:   04/03/20 1106 04/03/20 1154 04/03/20 1158 04/03/20 1224  BP: (!) 198/90  (!) 194/90 (!) 176/71  Pulse: 72  74 72  Resp: 18 16 16    Temp:   (!) 97.1 F (36.2 C)   TempSrc:   Oral   SpO2: 97% 98% 96%   Weight:      Height:        Intake/Output Summary (Last 24 hours) at 04/03/2020 1239 Last data filed at 04/03/2020 1234 Gross per 24 hour  Intake 1800 ml  Output 1000 ml  Net 800 ml   Filed Weights   04/01/20 0246  Weight: 76.9 kg    Examination:  General exam: Appears calm and comfortable  Respiratory system: Clear to auscultation. Respiratory effort normal. Cardiovascular system: S1 & S2 heard, RRR.  Gastrointestinal system: Abdomen is nondistended, soft and nontender.  Central nervous system: Alert and oriented. No focal neurological deficits. Extremities: Symmetric  Skin: No rashes Psychiatry: Judgement and insight appear normal. Mood & affect appropriate.     Data Reviewed: I have personally reviewed following labs and imaging studies  CBC: Recent Labs  Lab 03/31/20 2051 04/01/20 0334 04/02/20 0425 04/03/20 0327  WBC 17.6* 13.5* 16.0* 11.5*  NEUTROABS  --  10.6* 12.9* 7.7  HGB 12.4 10.6* 10.6* 9.3*  HCT 36.4 31.6* 32.2* 28.9*  MCV 96.6 96.6 97.3 98.6  PLT 280 241 251 [id number]   Basic Metabolic Panel: Recent Labs  Lab 03/31/20 2051 04/01/20 0334 04/02/20 0425 04/03/20 0327  NA 138 134* 137 140  K 2.9* 3.0* 3.3* 2.8*  CL 103 103 106 107  CO2 24 22 23 23   GLUCOSE 136* 110* 102* 88  BUN 23 15 9 8   CREATININE 1.18* 0.79 0.72 0.84  CALCIUM 9.5 8.3* 8.6* 8.5*  MG  --  2.1  --   --    GFR: Estimated Creatinine Clearance: 60.7 mL/min (by C-G formula based on SCr of 0.84 mg/dL). Liver Function Tests: Recent Labs  Lab 03/30/20 2051 04/01/20 0334 04/02/20 0425 04/03/20 0327  AST 897* 558* 157* 59*  ALT 389* 311* 206* 125*  ALKPHOS 187* 152* 147*  124  BILITOT 1.6* 1.2 1.1 0.8  PROT 7.9 6.5 6.5 6.2*  ALBUMIN 4.2 3.5 3.2* 3.0*   Recent Labs  Lab 03/31/20 2051 04/01/20 0334 04/02/20 0425  LIPASE 5,738* 1,724* 203*   No results for input(s): AMMONIA in the last 168 hours. Coagulation Profile: No results for input(s): INR, PROTIME in the last 168 hours. Cardiac Enzymes: No results for input(s): CKTOTAL, CKMB, CKMBINDEX, TROPONINI in the last 168 hours. BNP (last 3 results) No results for input(s): PROBNP in the last 8760 hours. HbA1C: No results for input(s): HGBA1C in the last 72 hours. CBG: No results for input(s): GLUCAP in the last 168 hours. Lipid Profile: No results for input(s): CHOL, HDL, LDLCALC, TRIG, CHOLHDL, LDLDIRECT in the last 72 hours. Thyroid Function Tests: No results for input(s): TSH, T4TOTAL, FREET4, T3FREE, THYROIDAB in the last 72 hours. Anemia Panel: No results for input(s): VITAMINB12, FOLATE, FERRITIN, TIBC, IRON, RETICCTPCT in the last 72 hours. Sepsis Labs: No results for input(s): PROCALCITON, LATICACIDVEN in the last 168 hours.  Recent Results (from the past 240 hour(s))  SARS Coronavirus 2 by RT PCR (hospital order, performed in [facility] hospital lab) Nasopharyngeal Nasopharyngeal Swab     Status: None   Collection Time: 04/01/20 12:46 AM   Specimen: Nasopharyngeal  Swab  Result Value Ref Range Status   SARS Coronavirus 2 NEGATIVE NEGATIVE Final    Comment: (NOTE) SARS-CoV-2 target nucleic acids are NOT DETECTED. The SARS-CoV-2 RNA is generally detectable in upper and lower respiratory specimens during the acute phase of infection. The lowest concentration of SARS-CoV-2 viral copies this assay can detect is 250 copies / mL. A negative result does not preclude SARS-CoV-2 infection and should not be used as the sole basis for treatment or other patient management decisions.  A negative result may occur with improper specimen collection / handling, submission of specimen other than  nasopharyngeal swab, presence of viral mutation(s) within the areas targeted by this assay, and inadequate number of viral copies (<250 copies / mL). A negative result must be combined with clinical observations, patient history, and epidemiological information. Fact Sheet for Patients:   StrictlyIdeas.no Fact Sheet for Healthcare Providers: BankingDealers.co.za This test is not yet approved or cleared  by the [place] FDA and has been authorized for detection and/or diagnosis of SARS-CoV-2 by FDA under an Emergency Use Authorization (EUA).  This EUA will remain in effect (meaning this test can be used) for the duration of the COVID-19 declaration under Section 564(b)(1) of the Act, 21 U.S.C. section 360bbb-3(b)(1), unless the authorization is terminated or revoked sooner. Performed at [facility], [place] [address], [place], [place] [address]       Radiology Studies: No results found.   Scheduled Meds: . amLODipine  10 mg Oral Daily  . HYDROmorphone   Intravenous Q4H  . ketorolac  15 mg Intravenous Once  . [START ON 04/04/2020] metoprolol succinate  100 mg Oral Daily  . sodium chloride flush  3 mL Intravenous Q12H   Continuous Infusions: . 0.9 % NaCl with KCl 20 mEq / L 150 mL/hr at 04/03/20 1153     LOS: 2 days    Donnamae Jude, MD 04/03/2020 12:39 PM [phone] [facility] If 7PM-7AM, please contact night-coverage 04/03/2020, 12:39 PM

## 2020-04-04 DIAGNOSIS — K838 Other specified diseases of biliary tract: Secondary | ICD-10-CM

## 2020-04-04 LAB — CBC WITH DIFFERENTIAL/PLATELET
Abs Immature Granulocytes: 0.04 10*3/uL (ref 0.00–0.07)
Basophils Absolute: 0.1 10*3/uL (ref 0.0–0.1)
Basophils Relative: 1 %
Eosinophils Absolute: 0.2 10*3/uL (ref 0.0–0.5)
Eosinophils Relative: 2 %
HCT: 27.3 % — ABNORMAL LOW (ref 36.0–46.0)
Hemoglobin: 8.7 g/dL — ABNORMAL LOW (ref 12.0–15.0)
Immature Granulocytes: 0 %
Lymphocytes Relative: 22 %
Lymphs Abs: 2 10*3/uL (ref 0.7–4.0)
MCH: 32.2 pg (ref 26.0–34.0)
MCHC: 31.9 g/dL (ref 30.0–36.0)
MCV: 101.1 fL — ABNORMAL HIGH (ref 80.0–100.0)
Monocytes Absolute: 1.1 10*3/uL — ABNORMAL HIGH (ref 0.1–1.0)
Monocytes Relative: 12 %
Neutro Abs: 5.8 10*3/uL (ref 1.7–7.7)
Neutrophils Relative %: 63 %
Platelets: 233 10*3/uL (ref 150–400)
RBC: 2.7 MIL/uL — ABNORMAL LOW (ref 3.87–5.11)
RDW: 13.7 % (ref 11.5–15.5)
WBC: 9.2 10*3/uL (ref 4.0–10.5)
nRBC: 0 % (ref 0.0–0.2)

## 2020-04-04 LAB — LIPASE, BLOOD: Lipase: 39 U/L (ref 11–51)

## 2020-04-04 LAB — COMPREHENSIVE METABOLIC PANEL
ALT: 80 U/L — ABNORMAL HIGH (ref 0–44)
AST: 30 U/L (ref 15–41)
Albumin: 2.9 g/dL — ABNORMAL LOW (ref 3.5–5.0)
Alkaline Phosphatase: 103 U/L (ref 38–126)
Anion gap: 8 (ref 5–15)
BUN: 8 mg/dL (ref 8–23)
CO2: 22 mmol/L (ref 22–32)
Calcium: 8.2 mg/dL — ABNORMAL LOW (ref 8.9–10.3)
Chloride: 108 mmol/L (ref 98–111)
Creatinine, Ser: 0.79 mg/dL (ref 0.44–1.00)
GFR calc Af Amer: >60 mL/min (ref >60)
GFR calc non Af Amer: >60 mL/min (ref >60)
Glucose, Bld: 74 mg/dL (ref 70–99)
Potassium: 3.4 mmol/L — ABNORMAL LOW (ref 3.5–5.1)
Sodium: 138 mmol/L (ref 135–145)
Total Bilirubin: 0.4 mg/dL (ref 0.3–1.2)
Total Protein: 5.8 g/dL — ABNORMAL LOW (ref 6.5–8.1)

## 2020-04-04 LAB — HEPATITIS PANEL, ACUTE
HCV Ab: NONREACTIVE
Hep A IgM: NONREACTIVE
Hep B C IgM: NONREACTIVE
Hepatitis B Surface Ag: NONREACTIVE

## 2020-04-04 LAB — MAGNESIUM: Magnesium: 1.7 mg/dL (ref 1.7–2.4)

## 2020-04-04 LAB — PHOSPHORUS: Phosphorus: 3.8 mg/dL (ref 2.5–4.6)

## 2020-04-04 MED ORDER — MAGNESIUM SULFATE 2 GM/50ML IV SOLN
2.0000 g | Freq: Once | INTRAVENOUS | Status: AC
  Administered 2020-04-04: 2 g via INTRAVENOUS
  Filled 2020-04-04: qty 50

## 2020-04-04 MED ORDER — HEPARIN SODIUM (PORCINE) 5000 UNIT/ML IJ SOLN
5000.0000 [IU] | Freq: Two times a day (BID) | INTRAMUSCULAR | Status: DC
  Administered 2020-04-04 – 2020-04-06 (×4): 5000 [IU] via SUBCUTANEOUS
  Filled 2020-04-04 (×6): qty 1

## 2020-04-04 MED ORDER — FERROUS SULFATE 325 (65 FE) MG PO TABS
325.0000 mg | ORAL_TABLET | Freq: Every day | ORAL | Status: DC
  Administered 2020-04-05 – 2020-04-07 (×3): 325 mg via ORAL
  Filled 2020-04-04 (×4): qty 1

## 2020-04-04 NOTE — Progress Notes (Signed)
Patient has complained of worsening nausea with sips of clear liquids. Antiemetics administered twice, see mar. Pt was encouraged to decrease input when feeling symptoms N/V. Will continue to monitor.

## 2020-04-04 NOTE — Progress Notes (Signed)
C Patient ID: Robin Arellano, female   DOB: Feb 23, 1951, 69 y.o.   MRN: [id number]    Progress Note   Subjective   Day # 4  CC; acute pancreatitis  Dilaudid PCA started yesterday IV fluids at 150 cc/h  Labs-K 3.4, creat 0.79  LFT's normal except ALT 80 Acute Hep Panel P Wbc 9.2, hgb 8.7 Lipase 39  MRI/MRCP 04/01/2020-distended gallbladder with mild wall thickening no gallstones, mild intrahepatic biliary ductal dilation with CBD max of 1.5 cm no choledocholithiasis identified, mild diffuse edema of the pancreas with peripancreatic fluid surrounding the head and duodenum, no necrosis or pseudocyst  Patient says she is much more comfortable today with PCA, current pain 6 out of 10 but had not used PCA for a while.  She has some queasiness no vomiting, is tolerating ice chips and sips of ginger ale.  No complaints of shortness of breath.  Having regular bowel movements.     Objective   Vital signs in last 24 hours: Temp:  [97.1 F (36.2 C)-98.7 F (37.1 C)] 98.5 F (36.9 C) (05/24 0518) Pulse Rate:  [66-74] 67 (05/24 0518) Resp:  [13-20] 18 (05/24 0827) BP: (144-198)/(62-90) 153/62 (05/24 0518) SpO2:  [91 %-98 %] 94 % (05/24 0827) Last BM Date: 04/03/20 General: Older white female in NAD Heart:  Regular rate and rhythm; no murmurs Lungs: Respirations even and unlabored, lungs CTA bilaterally Abdomen:  Soft, still quite tender across the upper abdomen with some guarding. Normal bowel sounds. Extremities:  Without edema. Neurologic:  Alert and oriented,  grossly normal neurologically. Psych:  Cooperative. Normal mood and affect.  Intake/Output from previous day: 05/23 0701 - 05/24 0700 In: 2288.4 [I.V.:2288.4] Out: 3750 [Urine:3750] Intake/Output this shift: Total I/O In: -  Out: 200 [Urine:200]  Lab Results: Recent Labs    04/02/20 0425 04/03/20 0327 04/04/20 0357  WBC 16.0* 11.5* 9.2  HGB 10.6* 9.3* 8.7*  HCT 32.2* 28.9* 27.3*  PLT 251 219 233   BMET Recent  Labs    04/02/20 0425 04/03/20 0327 04/04/20 0357  NA 137 140 138  K 3.3* 2.8* 3.4*  CL 106 107 108  CO2 23 23 22   GLUCOSE 102* 88 74  BUN 9 8 8   CREATININE 0.72 0.84 0.79  CALCIUM 8.6* 8.5* 8.2*   LFT Recent Labs    04/04/20 0357  PROT 5.8*  ALBUMIN 2.9*  AST 30  ALT 80*  ALKPHOS 103  BILITOT 0.4   PT/INR No results for input(s): LABPROT, INR in the last 72 hours.  Studies/Results: No results found.     Assessment / Plan:    #[age] year old white female with acute pancreatitis, of unclear etiology.  No gallstones in gallbladder but did have elevated LFTs on admission and CBD is dilated, raising question of possible choledocholithiasis as etiology though no stone seen on MR CP.  She is hemodynamically stable and gradually improving though suspect it will be 3 or 4 more days before she is able to be off PCA and take any significant p.o.'s.  Continue IV fluids at 150 an hour Continue n.p.o. except sips as long as requiring PCA for pain control Encourage incentive spirometry DVT prophylaxis Will plan eventual EUS as an outpatient once pancreatitis has significantly settled down.        Principal Problem:   Acute pancreatitis Active Problems:   Coronary artery calcification of native artery   Anxiety   Hypokalemia   Mild renal insufficiency   Breast nodule   Elevated troponin  Elevated liver enzymes   Abnormal magnetic resonance imaging of abdomen     LOS: 3 days   Jerelyn Trimarco EsterwoodPA-C  04/04/2020, 8:48 AM

## 2020-04-04 NOTE — Progress Notes (Signed)
PROGRESS NOTE  Robin Arellano [id number] DOB: 11-May-1951 DOA: 03/31/2020 PCP: Sandi Mariscal, MD  HPI/Recap of past 24 hours: Patient is a 69 year old female with history of hypertension, anxiety, hyperlipidemia, fibromyalgia who presented to the emergency room with complaint of severe epigastric pain, nausea and vomiting. She describes the pain in the epigastric region accompanied by nausea and vomiting. No history of alcohol intake. No history of hepatobiliary disease. On presentation she was hypertensive hypokalemic, had mild AKI. Lipase was elevated to 5738. She had leukocytosis. CT abdomen/pelvis showed distended gallbladder with intra and extrahepatic biliary duct dilation, mild dilation of distal pancreatic duct, right breast nodule. Started on pain management, IV fluids. GI following. Underwent MRCPwith finding of dilated CBD.there is suspicion for choledocyst.GI planning for endoscopic ultrasound/ERCP.   04/04/20: Seen and examined with her sister at bedside.  Epigastric pain and nausea improving.  States she is hungry.  Lipase has normalized.  Per GI okay for clear liquid diet as tolerated.   Assessment/Plan: Principal Problem:   Acute pancreatitis Active Problems:   Coronary artery calcification of native artery   Anxiety   Hypokalemia   Mild renal insufficiency   Breast nodule   Elevated troponin   Elevated liver enzymes   Abnormal magnetic resonance imaging of abdomen   Common bile duct dilation  Acute pancreatitis, unclear etiology Presented with lipase greater than 5700 and epigastric pain.  CT showed CBD.  MRCP with no demonstrable stone. GI following and recommends to continue IV fluids with pain management okay for clear liquid diet left tolerated Continue to monitor lipase level and symptomatology  Hypokalemia Repleted intravenously Magnesium 1.7, repleted  Repeat BMP in the morning  Hypomagnesemia Magnesium 1.7 Repleted intravenously  Essential  hypertension BP is stable Continue Norvasc 10 mg daily, lisinopril 5 mg daily, Toprol-XL 50 mg daily Continue to monitor vital signs.  Chronic back pain/physical debility PT OT to assess Mobilize as tolerated Out of bed to chair with every shift Fall precautions  Right breast nodule, incidental finding Outpatient mammogram recommended  AKI, prerenal, resolved post IV fluid hydration Back to her baseline creatinine 0.7 with GFR greater than 60  Iron deficiency anemia/chronic macrocytic anemia Baseline hemoglobin appears to be 10 Hemoglobin down to 8.7 No overt bleeding, suspect dilutional component On studies suggestive of iron deficiency Iron supplement 325 mg daily  DVT prophylaxis:  Subcu heparin 3 times daily Code Status: Full code  Family Communication: Sister at bedside    Consultants:   GI  General surgery  Procedures:  none    Status is: Inpatient   Dispo: The patient is from: Home.               Anticipated d/c is to: Home in the next 48 to 72 hours.              Anticipated d/c date is: 04/06/2020.               Patient currently ongoing management for pancreatitis.         Objective: Vitals:   04/04/20 0827 04/04/20 1029 04/04/20 1131 04/04/20 1306  BP:  (!) 162/71  (!) 149/62  Pulse:  66  70  Resp: 18 16 16 19   Temp:  99 F (37.2 C)  98.4 F (36.9 C)  TempSrc:  Oral  Oral  SpO2: 94% 95% 93% 93%  Weight:      Height:        Intake/Output Summary (Last 24 hours) at 04/04/2020 1309 Last data  filed at 04/04/2020 [id number] Gross per 24 hour  Intake 2288.36 ml  Output 2950 ml  Net -661.64 ml   Filed Weights   04/01/20 0246  Weight: 76.9 kg    Exam:  . General: 69 y.o. year-old female well developed well nourished in no acute distress.  Alert and oriented x3. . Cardiovascular: Regular rate and rhythm with no rubs or gallops.  No thyromegaly or JVD noted.   Marland Kitchen Respiratory: Clear to auscultation with no wheezes or rales. Good  inspiratory effort. . Abdomen: Epigastric tenderness with palpation.  Bowel sounds present.   . Musculoskeletal: No lower extremity edema bilaterally.   Marland Kitchen Psychiatry: Mood is appropriate for condition and setting   Data Reviewed: CBC: Recent Labs  Lab 03/31/20 2051 04/01/20 0334 04/02/20 0425 04/03/20 0327 04/04/20 0357  WBC 17.6* 13.5* 16.0* 11.5* 9.2  NEUTROABS  --  10.6* 12.9* 7.7 5.8  HGB 12.4 10.6* 10.6* 9.3* 8.7*  HCT 36.4 31.6* 32.2* 28.9* 27.3*  MCV 96.6 96.6 97.3 98.6 101.1*  PLT 280 241 251 219 0000000   Basic Metabolic Panel: Recent Labs  Lab 03/31/20 2051 04/01/20 0334 04/02/20 0425 04/03/20 0327 04/04/20 0357  NA 138 134* 137 140 138  K 2.9* 3.0* 3.3* 2.8* 3.4*  CL 103 103 106 107 108  CO2 24 22 23 23 22   GLUCOSE 136* 110* 102* 88 74  BUN 23 15 9 8 8   CREATININE 1.18* 0.79 0.72 0.84 0.79  CALCIUM 9.5 8.3* 8.6* 8.5* 8.2*  MG  --  2.1  --   --  1.7  PHOS  --   --   --   --  3.8   GFR: Estimated Creatinine Clearance: 63.7 mL/min (by C-G formula based on SCr of 0.79 mg/dL). Liver Function Tests: Recent Labs  Lab 03/30/20 2051 04/01/20 0334 04/02/20 0425 04/03/20 0327 04/04/20 0357  AST 897* 558* 157* 59* 30  ALT 389* 311* 206* 125* 80*  ALKPHOS 187* 152* 147* 124 103  BILITOT 1.6* 1.2 1.1 0.8 0.4  PROT 7.9 6.5 6.5 6.2* 5.8*  ALBUMIN 4.2 3.5 3.2* 3.0* 2.9*   Recent Labs  Lab 03/31/20 2051 04/01/20 0334 04/02/20 0425 04/04/20 0357  LIPASE 5,738* 1,724* 203* 39   No results for input(s): AMMONIA in the last 168 hours. Coagulation Profile: No results for input(s): INR, PROTIME in the last 168 hours. Cardiac Enzymes: No results for input(s): CKTOTAL, CKMB, CKMBINDEX, TROPONINI in the last 168 hours. BNP (last 3 results) No results for input(s): PROBNP in the last 8760 hours. HbA1C: No results for input(s): HGBA1C in the last 72 hours. CBG: No results for input(s): GLUCAP in the last 168 hours. Lipid Profile: No results for input(s): CHOL,  HDL, LDLCALC, TRIG, CHOLHDL, LDLDIRECT in the last 72 hours. Thyroid Function Tests: No results for input(s): TSH, T4TOTAL, FREET4, T3FREE, THYROIDAB in the last 72 hours. Anemia Panel: Recent Labs    04/03/20 1144  VITAMINB12 277  FOLATE 18.0  FERRITIN 192  TIBC 251  IRON 31   Urine analysis:    Component Value Date/Time   COLORURINE YELLOW 03/31/2020 2330   APPEARANCEUR CLEAR 03/31/2020 2330   LABSPEC 1.011 03/31/2020 2330   PHURINE 6.0 03/31/2020 2330   GLUCOSEU NEGATIVE 03/31/2020 2330   HGBUR NEGATIVE 03/31/2020 2330   BILIRUBINUR NEGATIVE 03/31/2020 2330   KETONESUR NEGATIVE 03/31/2020 2330   PROTEINUR 100 (A) 03/31/2020 2330   UROBILINOGEN 0.2 05/26/2014 1158   NITRITE POSITIVE (A) 03/31/2020 2330   LEUKOCYTESUR NEGATIVE 03/31/2020 2330  Sepsis Labs: @LABRCNTIP (procalcitonin:4,lacticidven:4)  ) Recent Results (from the past 240 hour(s))  SARS Coronavirus 2 by RT PCR (hospital order, performed in [facility] hospital lab) Nasopharyngeal Nasopharyngeal Swab     Status: None   Collection Time: 04/01/20 12:46 AM   Specimen: Nasopharyngeal Swab  Result Value Ref Range Status   SARS Coronavirus 2 NEGATIVE NEGATIVE Final    Comment: (NOTE) SARS-CoV-2 target nucleic acids are NOT DETECTED. The SARS-CoV-2 RNA is generally detectable in upper and lower respiratory specimens during the acute phase of infection. The lowest concentration of SARS-CoV-2 viral copies this assay can detect is 250 copies / mL. A negative result does not preclude SARS-CoV-2 infection and should not be used as the sole basis for treatment or other patient management decisions.  A negative result may occur with improper specimen collection / handling, submission of specimen other than nasopharyngeal swab, presence of viral mutation(s) within the areas targeted by this assay, and inadequate number of viral copies (<250 copies / mL). A negative result must be combined with clinical observations,  patient history, and epidemiological information. Fact Sheet for Patients:   StrictlyIdeas.no Fact Sheet for Healthcare Providers: BankingDealers.co.za This test is not yet approved or cleared  by the [place] FDA and has been authorized for detection and/or diagnosis of SARS-CoV-2 by FDA under an Emergency Use Authorization (EUA).  This EUA will remain in effect (meaning this test can be used) for the duration of the COVID-19 declaration under Section 564(b)(1) of the Act, 21 U.S.C. section 360bbb-3(b)(1), unless the authorization is terminated or revoked sooner. Performed at [facility], [place] [address], [place], [place] [address]       Studies: No results found.  Scheduled Meds: . amLODipine  10 mg Oral Daily  . capsicum   Topical BID  . heparin injection (subcutaneous)  5,000 Units Subcutaneous Q12H  . HYDROmorphone   Intravenous Q4H  . ketorolac  15 mg Intravenous Once  . lisinopril  5 mg Oral Daily  . metoprolol succinate  50 mg Oral Daily  . sodium chloride flush  3 mL Intravenous Q12H    Continuous Infusions: . 0.9 % NaCl with KCl 20 mEq / L 150 mL/hr at 04/04/20 1131     LOS: 3 days     Kayleen Memos, MD [facility] Pager [phone]  If 7PM-7AM, please contact night-coverage [email or web address] Password [email or web address] 04/04/2020, 1:09 PM

## 2020-04-05 DIAGNOSIS — K859 Acute pancreatitis without necrosis or infection, unspecified: Principal | ICD-10-CM

## 2020-04-05 DIAGNOSIS — R1084 Generalized abdominal pain: Secondary | ICD-10-CM

## 2020-04-05 LAB — COMPREHENSIVE METABOLIC PANEL
ALT: 61 U/L — ABNORMAL HIGH (ref 0–44)
AST: 23 U/L (ref 15–41)
Albumin: 2.9 g/dL — ABNORMAL LOW (ref 3.5–5.0)
Alkaline Phosphatase: 107 U/L (ref 38–126)
Anion gap: 9 (ref 5–15)
BUN: 6 mg/dL — ABNORMAL LOW (ref 8–23)
CO2: 23 mmol/L (ref 22–32)
Calcium: 8.3 mg/dL — ABNORMAL LOW (ref 8.9–10.3)
Chloride: 105 mmol/L (ref 98–111)
Creatinine, Ser: 0.91 mg/dL (ref 0.44–1.00)
GFR calc Af Amer: >60 mL/min (ref >60)
GFR calc non Af Amer: >60 mL/min (ref >60)
Glucose, Bld: 75 mg/dL (ref 70–99)
Potassium: 4.3 mmol/L (ref 3.5–5.1)
Sodium: 137 mmol/L (ref 135–145)
Total Bilirubin: 0.5 mg/dL (ref 0.3–1.2)
Total Protein: 6.2 g/dL — ABNORMAL LOW (ref 6.5–8.1)

## 2020-04-05 LAB — LIPASE, BLOOD: Lipase: 36 U/L (ref 11–51)

## 2020-04-05 LAB — CBC
HCT: 28.9 % — ABNORMAL LOW (ref 36.0–46.0)
Hemoglobin: 9.2 g/dL — ABNORMAL LOW (ref 12.0–15.0)
MCH: 32.9 pg (ref 26.0–34.0)
MCHC: 31.8 g/dL (ref 30.0–36.0)
MCV: 103.2 fL — ABNORMAL HIGH (ref 80.0–100.0)
Platelets: 233 10*3/uL (ref 150–400)
RBC: 2.8 MIL/uL — ABNORMAL LOW (ref 3.87–5.11)
RDW: 13.7 % (ref 11.5–15.5)
WBC: 8.9 10*3/uL (ref 4.0–10.5)
nRBC: 0 % (ref 0.0–0.2)

## 2020-04-05 LAB — C-REACTIVE PROTEIN: CRP: 19.6 mg/dL — ABNORMAL HIGH (ref <1.0)

## 2020-04-05 LAB — SEDIMENTATION RATE: Sed Rate: 112 mm/hr — ABNORMAL HIGH (ref 0–22)

## 2020-04-05 MED ORDER — DIPHENHYDRAMINE-ZINC ACETATE 2-0.1 % EX CREA
TOPICAL_CREAM | Freq: Three times a day (TID) | CUTANEOUS | Status: DC | PRN
  Administered 2020-04-05: 1 via TOPICAL
  Filled 2020-04-05: qty 28

## 2020-04-05 MED ORDER — DEXTROSE IN LACTATED RINGERS 5 % IV SOLN: INTRAVENOUS | Status: DC

## 2020-04-05 MED ORDER — ACETAMINOPHEN 325 MG PO TABS
650.0000 mg | ORAL_TABLET | Freq: Once | ORAL | Status: AC
  Administered 2020-04-05: 650 mg via ORAL
  Filled 2020-04-05: qty 2

## 2020-04-05 NOTE — Progress Notes (Signed)
PROGRESS NOTE  Robin Arellano [id number] DOB: 05/17/1951 DOA: 03/31/2020 PCP: Sandi Mariscal, MD  HPI/Recap of past 24 hours: Patient is a 69 year old female with history of hypertension, anxiety, hyperlipidemia, fibromyalgia who presented to the emergency room with complaint of severe epigastric pain, nausea and vomiting. She describes the pain in the epigastric region accompanied by nausea and vomiting. No history of alcohol intake. No history of hepatobiliary disease. On presentation she was hypertensive hypokalemic, had mild AKI. Lipase was elevated to 5738. She had leukocytosis. CT abdomen/pelvis showed distended gallbladder with intra and extrahepatic biliary duct dilation, mild dilation of distal pancreatic duct, right breast nodule. Started on pain management, IV fluids. GI following. Underwent MRCPwith finding of dilated CBD.there is suspicion for choledocyst.GI planning for endoscopic ultrasound/ERCP.   04/05/20:  Seen and examined.  Reports epigastric pain that persists.  Lipase normal.  On clear liquid diet.  Also reports B/L flank pain.  CT abd/pelvis no hydronephrosis or acute findings. UA done on admission was positive for pyuria.  Will obtain urine cx.    Assessment/Plan: Principal Problem:   Acute pancreatitis Active Problems:   Coronary artery calcification of native artery   Anxiety   Hypokalemia   Mild renal insufficiency   Breast nodule   Elevated troponin   Elevated liver enzymes   Abnormal magnetic resonance imaging of abdomen   Common bile duct dilation  Acute pancreatitis, unclear etiology Presented with lipase greater than 5700 and epigastric pain.  CT showed CBD.  MRCP with no demonstrable stone. GI following and recommends to continue IV fluids with pain management okay for clear liquid diet as tolerated Lipase level normal, still has epigastric pain with palpation  B/L flank pain, unclear etiology Unremarkable CT abd pelvis Tender with minimal  touch of her flank Renal function ok Tmax 100 this AM, no leukocytosis UA positive for pyuria on 03/31/20 Get U cx.  Resolved post repletion: Hypokalemia K+4.3  Hypomagnesemia Magnesium 1.7 Repleted intravenously  Essential hypertension BP is stable Continue Norvasc 10 mg daily, lisinopril 5 mg daily, Toprol-XL 50 mg daily Continue to monitor vital signs.  Chronic back pain/physical debility PT OT to assess Mobilize as tolerated Out of bed to chair with every shift Fall precautions  Right breast nodule, incidental finding Outpatient mammogram recommended  AKI, prerenal, resolved post IV fluid hydration Back to her baseline creatinine with GFR greater than 60  Iron deficiency anemia/chronic macrocytic anemia Baseline hemoglobin appears to be 10 Hemoglobin down to 8.7 No overt bleeding, suspect dilutional component Iron studies suggestive of iron deficiency Continue Iron supplement 325 mg daily  DVT prophylaxis:  Subcu heparin 3 times daily Code Status: Full code  Family Communication: Sister at bedside    Consultants:   GI  General surgery  Procedures:  none    Status is: Inpatient   Dispo: The patient is from: Home.               Anticipated d/c is to: Home in the next 48 to 72 hours.              Anticipated d/c date is: 04/07/2020.               Patient currently ongoing management for pancreatitis and work up for B/L flank pain.         Objective: Vitals:   04/05/20 1135 04/05/20 1135 04/05/20 1251 04/05/20 1622  BP:  (!) 164/74 (!) 147/58   Pulse:  65 61   Resp: 18 18 17  16  Temp:  98.8 F (37.1 C) 99.1 F (37.3 C)   TempSrc:  Oral Oral   SpO2: 96% 98% 96% 96%  Weight:      Height:        Intake/Output Summary (Last 24 hours) at 04/05/2020 1648 Last data filed at 04/05/2020 1500 Gross per 24 hour  Intake 2797.53 ml  Output 5550 ml  Net -2752.47 ml   Filed Weights   04/01/20 0246  Weight: 76.9 kg    Exam:  . General:  69 y.o. year-old female well-developed well-nourished in no acute stress.  Alert oriented x3.. .   Cardiovascular: Regular rate and rhythm no rubs or gallops.   Marland Kitchen Respiratory: Clear to auscultation no wheezes or rales. . Abdomen: Epigastric pain with palpation.  Bilateral lower flank tenderness with palpation.  Bowel sounds present. . Musculoskeletal: No lower extremity edema bilaterally.   Marland Kitchen Psychiatry: Mood is appropriate for condition and setting.   Data Reviewed: CBC: Recent Labs  Lab 04/01/20 0334 04/02/20 0425 04/03/20 0327 04/04/20 0357 04/05/20 0621  WBC 13.5* 16.0* 11.5* 9.2 8.9  NEUTROABS 10.6* 12.9* 7.7 5.8  --   HGB 10.6* 10.6* 9.3* 8.7* 9.2*  HCT 31.6* 32.2* 28.9* 27.3* 28.9*  MCV 96.6 97.3 98.6 101.1* 103.2*  PLT 241 251 219 233 0000000   Basic Metabolic Panel: Recent Labs  Lab 04/01/20 0334 04/02/20 0425 04/03/20 0327 04/04/20 0357 04/05/20 0621  NA 134* 137 140 138 137  K 3.0* 3.3* 2.8* 3.4* 4.3  CL 103 106 107 108 105  CO2 22 23 23 22 23   GLUCOSE 110* 102* 88 74 75  BUN 15 9 8 8  6*  CREATININE 0.79 0.72 0.84 0.79 0.91  CALCIUM 8.3* 8.6* 8.5* 8.2* 8.3*  MG 2.1  --   --  1.7  --   PHOS  --   --   --  3.8  --    GFR: Estimated Creatinine Clearance: 56 mL/min (by C-G formula based on SCr of 0.91 mg/dL). Liver Function Tests: Recent Labs  Lab 04/01/20 0334 04/02/20 0425 04/03/20 0327 04/04/20 0357 04/05/20 0621  AST 558* 157* 59* 30 23  ALT 311* 206* 125* 80* 61*  ALKPHOS 152* 147* 124 103 107  BILITOT 1.2 1.1 0.8 0.4 0.5  PROT 6.5 6.5 6.2* 5.8* 6.2*  ALBUMIN 3.5 3.2* 3.0* 2.9* 2.9*   Recent Labs  Lab 03/31/20 2051 04/01/20 0334 04/02/20 0425 04/04/20 0357 04/05/20 0621  LIPASE 5,738* 1,724* 203* 39 36   No results for input(s): AMMONIA in the last 168 hours. Coagulation Profile: No results for input(s): INR, PROTIME in the last 168 hours. Cardiac Enzymes: No results for input(s): CKTOTAL, CKMB, CKMBINDEX, TROPONINI in the last 168  hours. BNP (last 3 results) No results for input(s): PROBNP in the last 8760 hours. HbA1C: No results for input(s): HGBA1C in the last 72 hours. CBG: No results for input(s): GLUCAP in the last 168 hours. Lipid Profile: No results for input(s): CHOL, HDL, LDLCALC, TRIG, CHOLHDL, LDLDIRECT in the last 72 hours. Thyroid Function Tests: No results for input(s): TSH, T4TOTAL, FREET4, T3FREE, THYROIDAB in the last 72 hours. Anemia Panel: Recent Labs    04/03/20 1144  VITAMINB12 277  FOLATE 18.0  FERRITIN 192  TIBC 251  IRON 31   Urine analysis:    Component Value Date/Time   COLORURINE YELLOW 03/31/2020 2330   APPEARANCEUR CLEAR 03/31/2020 2330   LABSPEC 1.011 03/31/2020 2330   PHURINE 6.0 03/31/2020 2330   GLUCOSEU NEGATIVE  03/31/2020 2330   HGBUR NEGATIVE 03/31/2020 2330   BILIRUBINUR NEGATIVE 03/31/2020 2330   KETONESUR NEGATIVE 03/31/2020 2330   PROTEINUR 100 (A) 03/31/2020 2330   UROBILINOGEN 0.2 05/26/2014 1158   NITRITE POSITIVE (A) 03/31/2020 2330   LEUKOCYTESUR NEGATIVE 03/31/2020 2330   Sepsis Labs: @LABRCNTIP (procalcitonin:4,lacticidven:4)  ) Recent Results (from the past 240 hour(s))  SARS Coronavirus 2 by RT PCR (hospital order, performed in [facility] hospital lab) Nasopharyngeal Nasopharyngeal Swab     Status: None   Collection Time: 04/01/20 12:46 AM   Specimen: Nasopharyngeal Swab  Result Value Ref Range Status   SARS Coronavirus 2 NEGATIVE NEGATIVE Final    Comment: (NOTE) SARS-CoV-2 target nucleic acids are NOT DETECTED. The SARS-CoV-2 RNA is generally detectable in upper and lower respiratory specimens during the acute phase of infection. The lowest concentration of SARS-CoV-2 viral copies this assay can detect is 250 copies / mL. A negative result does not preclude SARS-CoV-2 infection and should not be used as the sole basis for treatment or other patient management decisions.  A negative result may occur with improper specimen collection /  handling, submission of specimen other than nasopharyngeal swab, presence of viral mutation(s) within the areas targeted by this assay, and inadequate number of viral copies (<250 copies / mL). A negative result must be combined with clinical observations, patient history, and epidemiological information. Fact Sheet for Patients:   StrictlyIdeas.no Fact Sheet for Healthcare Providers: BankingDealers.co.za This test is not yet approved or cleared  by the [place] FDA and has been authorized for detection and/or diagnosis of SARS-CoV-2 by FDA under an Emergency Use Authorization (EUA).  This EUA will remain in effect (meaning this test can be used) for the duration of the COVID-19 declaration under Section 564(b)(1) of the Act, 21 U.S.C. section 360bbb-3(b)(1), unless the authorization is terminated or revoked sooner. Performed at [facility], [place] [address], [place], [place] [address]       Studies: No results found.  Scheduled Meds: . amLODipine  10 mg Oral Daily  . capsicum   Topical BID  . ferrous sulfate  325 mg Oral Q breakfast  . heparin injection (subcutaneous)  5,000 Units Subcutaneous Q12H  . HYDROmorphone   Intravenous Q4H  . ketorolac  15 mg Intravenous Once  . lisinopril  5 mg Oral Daily  . metoprolol succinate  50 mg Oral Daily  . sodium chloride flush  3 mL Intravenous Q12H    Continuous Infusions: . dextrose 5% lactated ringers 50 mL/hr at 04/05/20 1133     LOS: 4 days     Kayleen Memos, MD [facility] Pager [phone]  If 7PM-7AM, please contact night-coverage [email or web address] Password [facility] 04/05/2020, 4:48 PM

## 2020-04-05 NOTE — Progress Notes (Signed)
PT Cancellation Note  Patient Details Name: Robin Arellano MRN: [id number] DOB: 09-16-1951   Cancelled Treatment:    Reason Eval/Treat Not Completed: Fatigue/lethargy limiting ability to participate. Patient reports that she has been in recliner, to [facility] and short amb. Will check back tomorrow.sign    [name] 04/05/2020, 2:09 PM  [place] Pager [phone] Office [phone]

## 2020-04-05 NOTE — Progress Notes (Signed)
OT Cancellation Note  Patient Details Name: Robin Arellano MRN: [id number] DOB: 03/20/1951   Cancelled Treatment:    Pt declined OT this day- will check on pt next day Kari Baars, [place] [phone] Office- [phone], Thereasa Parkin 04/05/2020, 5:38 PM

## 2020-04-05 NOTE — Care Management Important Message (Signed)
Important Message  Patient Details IM Letter given to Nancy Marus RN Case Manager to present to the Patient Name: Robin Arellano MRN: [id number] Date of Birth: Dec 12, 1950   Medicare Important Message Given:  Yes     Kerin Salen 04/05/2020, 11:40 AM

## 2020-04-05 NOTE — Progress Notes (Signed)
    Progress Note   Subjective  Day #5   Chief Complaint: Acute pancreatitis  MRI/MRCP 04/01/2020-distended gallbladder with mild wall thickening no gallstones, mild intrahepatic biliary ductal dilation with CBD max of 1.5 cm no choledocholithiasis identified, mild diffuse edema of the pancreas with peripancreatic fluid surrounding the head and duodenum, no necrosis or pseudocyst  Today, the patient tells me that she became more uncomfortable last night, even with her PCA.  Currently her pain is a 8-9/10.  Explains that when she was drinking her clear broth yesterday she gotten a few sips in before this started hurting again.  She has requested clears again for breakfast though and tells me that "I can handle it".  Tells me she would like to figure out what is going on she does not feel like she is making much progress.   Objective   Vital signs in last 24 hours: Temp:  [98.4 F (36.9 C)-100 F (37.8 C)] 99 F (37.2 C) (05/25 0408) Pulse Rate:  [64-72] 72 (05/25 0408) Resp:  [12-19] 16 (05/25 0755) BP: (147-162)/(62-78) 148/65 (05/25 0408) SpO2:  [93 %-96 %] 96 % (05/25 0738) Last BM Date: 04/03/20 General:    white female in NAD Heart:  Regular rate and rhythm; no murmurs Lungs: Respirations even and unlabored, lungs CTA bilaterally Abdomen:  Soft, marked ttp to only light palpation in the epigastrum and nondistended. Normal bowel sounds. Extremities:  Without edema. Neurologic:  Alert and oriented,  grossly normal neurologically. Psych:  Cooperative. Normal mood and affect.  Intake/Output from previous day: 05/24 0701 - 05/25 0700 In: 3063.8 [P.O.:380; I.V.:2633.7; IV Piggyback:50.1] Out: 4500 [Urine:4500] Intake/Output this shift: Total I/O In: -  Out: 1200 [Urine:1200]  Lab Results: Recent Labs    04/03/20 0327 04/04/20 0357 04/05/20 0621  WBC 11.5* 9.2 8.9  HGB 9.3* 8.7* 9.2*  HCT 28.9* 27.3* 28.9*  PLT 219 233 233   BMET Recent Labs    04/03/20 0327  04/04/20 0357 04/05/20 0621  NA 140 138 137  K 2.8* 3.4* 4.3  CL 107 108 105  CO2 23 22 23   GLUCOSE 88 74 75  BUN 8 8 6*  CREATININE 0.84 0.79 0.91  CALCIUM 8.5* 8.2* 8.3*   LFT Recent Labs    04/05/20 0621  PROT 6.2*  ALBUMIN 2.9*  AST 23  ALT 61*  ALKPHOS 107  BILITOT 0.5     Assessment / Plan:   Assessment: 1.  Acute pancreatitis: Unclear etiology, no gallstones in the gallbladder but did have elevated LFTs on admission and CBD is dilated; question possible choledocholithiasis etiology though no stone seen on MRCP versus sludge versus other  Plan: 1.  Continue IV fluids 2.  Continue clear liquids today, if patient is unable to tolerate then may need to consider Cortrak 3.  Continue DVT prophylaxis 4.  Again plan for eventual EUS as an outpatient once pancreatitis has significantly settled down 5.  Please awaiting further recommendations from Dr. Stefani Dama Oddy later today.  Thank you for kind consultation, we will continue to follow.   LOS: 4 days   [name]  04/05/2020, 9:17 AM

## 2020-04-06 ENCOUNTER — Inpatient Hospital Stay (HOSPITAL_COMMUNITY): Payer: PPO

## 2020-04-06 DIAGNOSIS — F419 Anxiety disorder, unspecified: Secondary | ICD-10-CM

## 2020-04-06 LAB — CBC
HCT: 29.4 % — ABNORMAL LOW (ref 36.0–46.0)
Hemoglobin: 9.3 g/dL — ABNORMAL LOW (ref 12.0–15.0)
MCH: 32 pg (ref 26.0–34.0)
MCHC: 31.6 g/dL (ref 30.0–36.0)
MCV: 101 fL — ABNORMAL HIGH (ref 80.0–100.0)
Platelets: 249 10*3/uL (ref 150–400)
RBC: 2.91 MIL/uL — ABNORMAL LOW (ref 3.87–5.11)
RDW: 13.4 % (ref 11.5–15.5)
WBC: 8.4 10*3/uL (ref 4.0–10.5)
nRBC: 0 % (ref 0.0–0.2)

## 2020-04-06 LAB — COMPREHENSIVE METABOLIC PANEL
ALT: 50 U/L — ABNORMAL HIGH (ref 0–44)
AST: 21 U/L (ref 15–41)
Albumin: 3.3 g/dL — ABNORMAL LOW (ref 3.5–5.0)
Alkaline Phosphatase: 109 U/L (ref 38–126)
Anion gap: 8 (ref 5–15)
BUN: 6 mg/dL — ABNORMAL LOW (ref 8–23)
CO2: 26 mmol/L (ref 22–32)
Calcium: 9.1 mg/dL (ref 8.9–10.3)
Chloride: 103 mmol/L (ref 98–111)
Creatinine, Ser: 0.9 mg/dL (ref 0.44–1.00)
GFR calc Af Amer: >60 mL/min (ref >60)
GFR calc non Af Amer: >60 mL/min (ref >60)
Glucose, Bld: 90 mg/dL (ref 70–99)
Potassium: 4.2 mmol/L (ref 3.5–5.1)
Sodium: 137 mmol/L (ref 135–145)
Total Bilirubin: 0.2 mg/dL — ABNORMAL LOW (ref 0.3–1.2)
Total Protein: 6.7 g/dL (ref 6.5–8.1)

## 2020-04-06 LAB — LIPASE, BLOOD: Lipase: 35 U/L (ref 11–51)

## 2020-04-06 IMAGING — CT CT ABD-PELV W/ CM
2 of 5 series · 16 of 46 positions shown, 18 images · IV contrast (APPLIED)
Comparison: [DATE] CT, [DATE] MRI

CLINICAL DATA: Worsening epigastric pain

EXAM:
CT ABDOMEN AND PELVIS WITH CONTRAST
TECHNIQUE: Multidetector CT imaging of the abdomen and pelvis was performed
using the standard protocol following bolus administration of
intravenous contrast.
CONTRAST:  100mL OMNIPAQUE IOHEXOL 300 MG/ML  SOLN

[Series 2: axial st · axial · 0.81mm/px · z∈[+1154,+1554]mm · 13 of 94 slices shown, 15 images]
[im 7/94  soft-tissue]
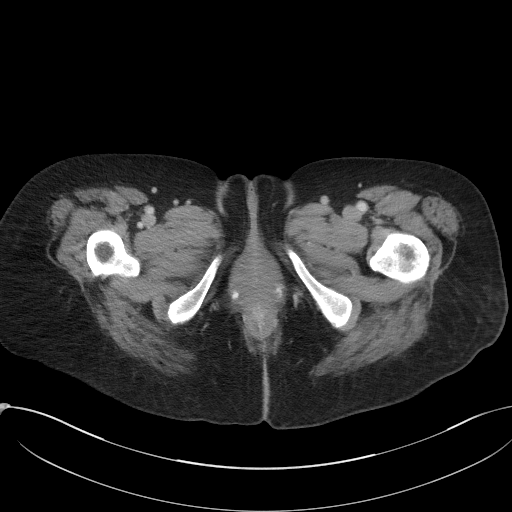
[im 7/94  bone]
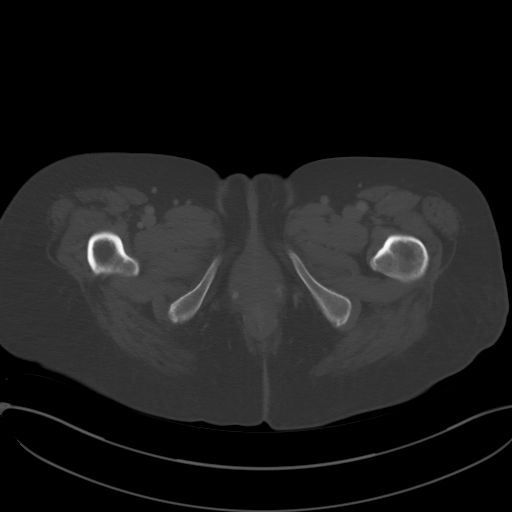
[im 14/94  soft-tissue]
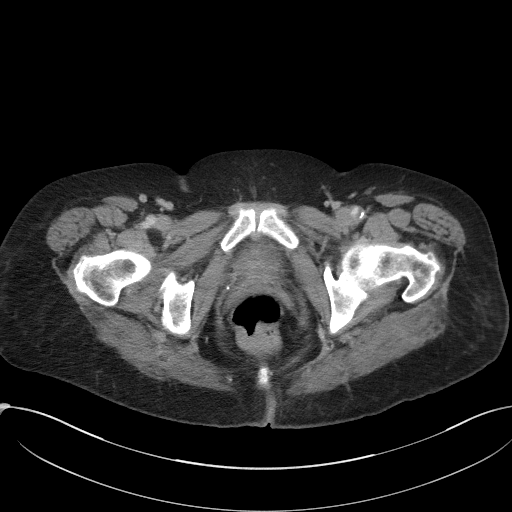
[im 20/94  soft-tissue]
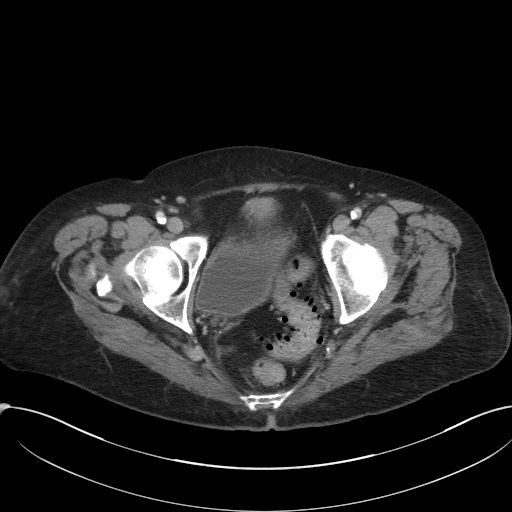
[im 27/94  soft-tissue]
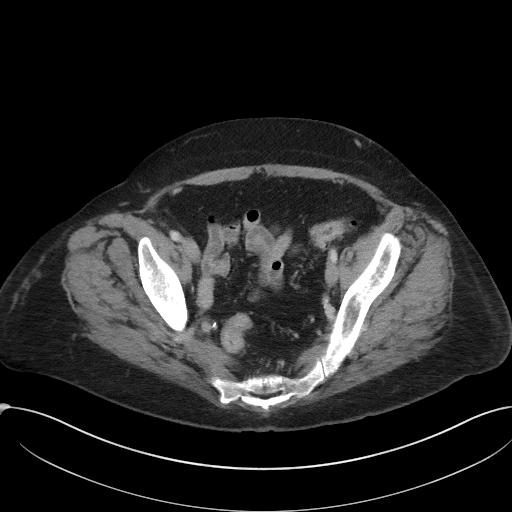
[im 34/94  soft-tissue]
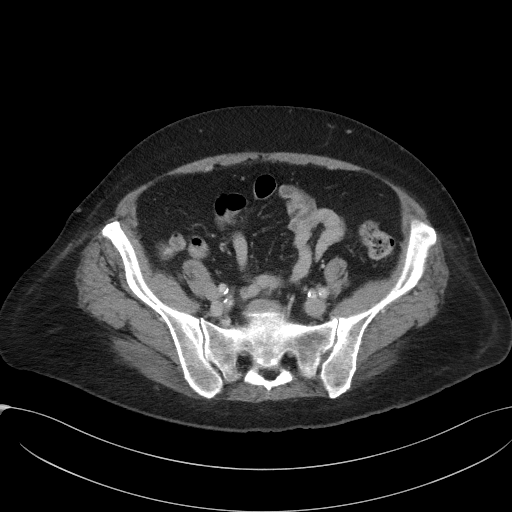
[im 40/94  soft-tissue]
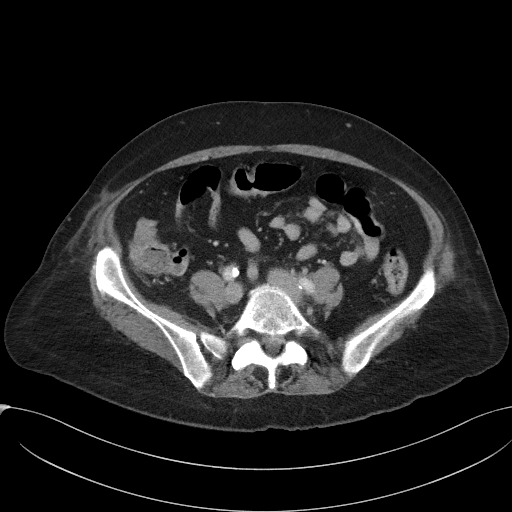
[im 47/94  soft-tissue]
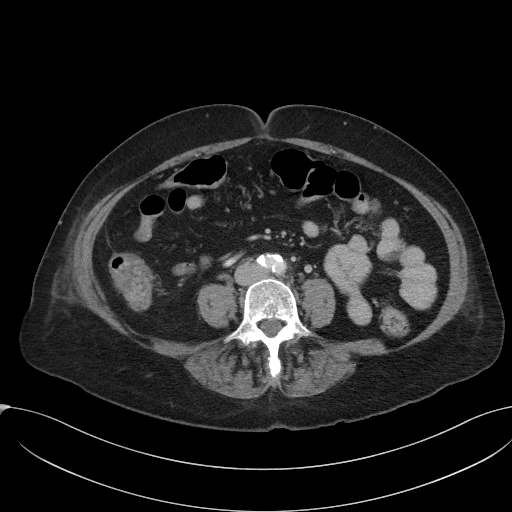
[im 54/94  soft-tissue]
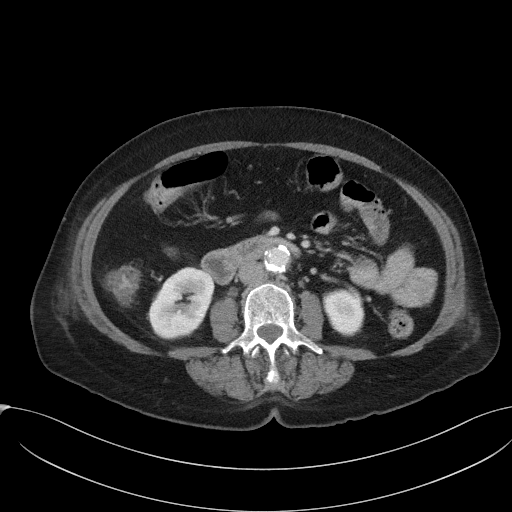
[im 60/94  soft-tissue]
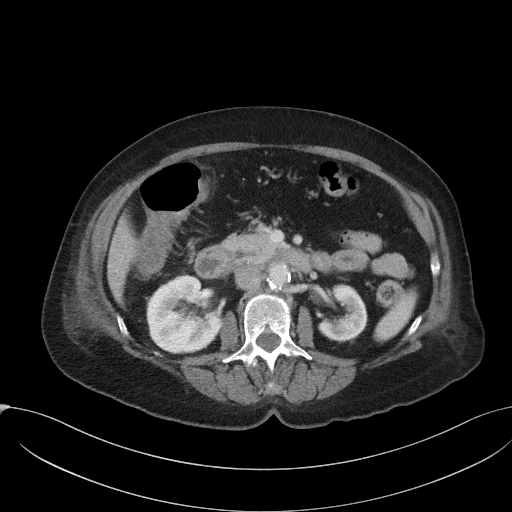
[im 60/94  bone]
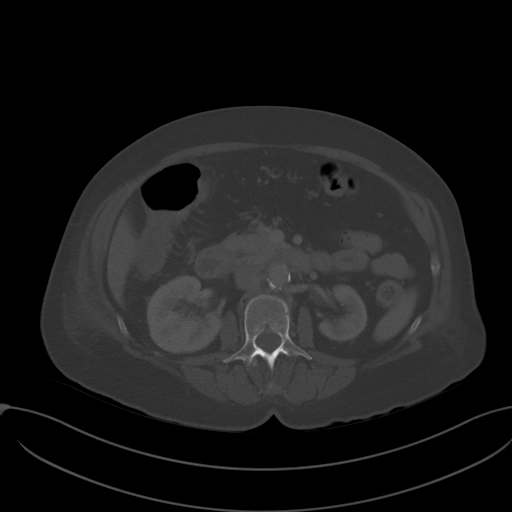
[im 67/94  soft-tissue]
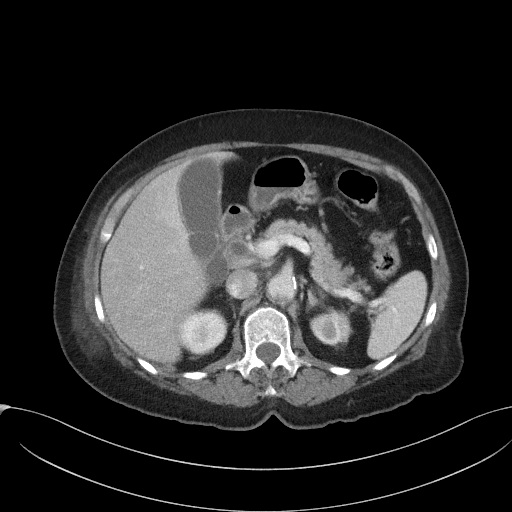
[im 74/94  soft-tissue]
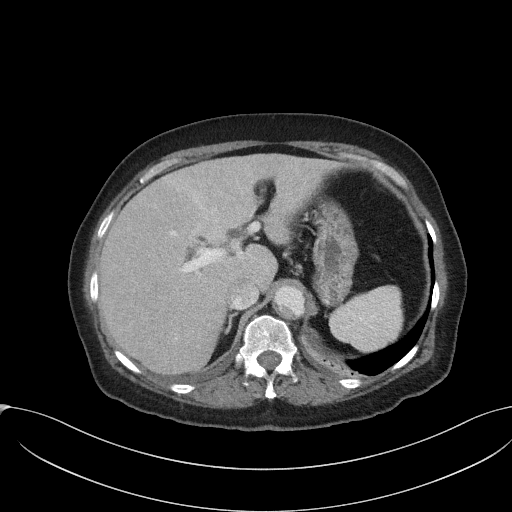
[im 80/94  soft-tissue]
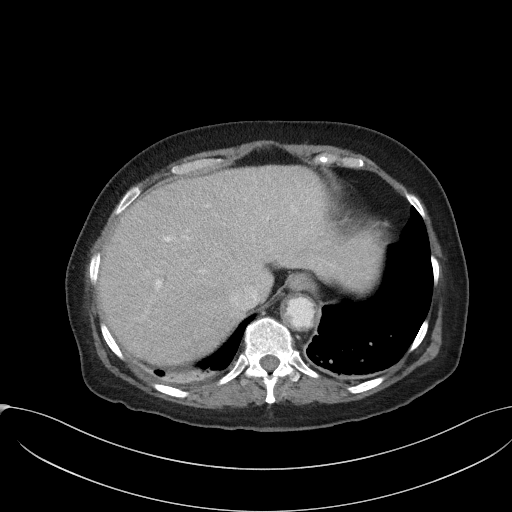
[im 87/94  soft-tissue]
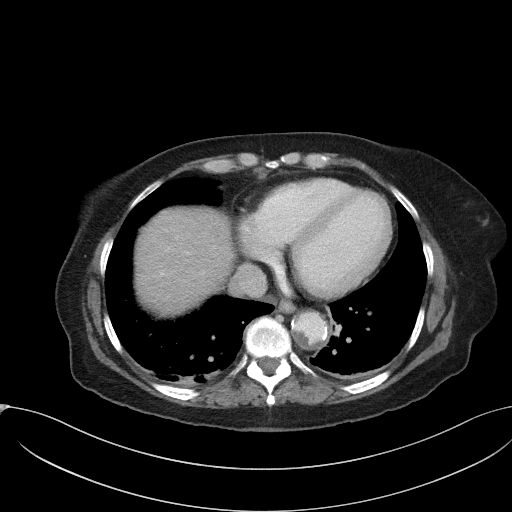

[Series 5: coronal st · coronal · 0.73mm/px · 3 of 93 slices shown]
[im 31/93  soft-tissue]
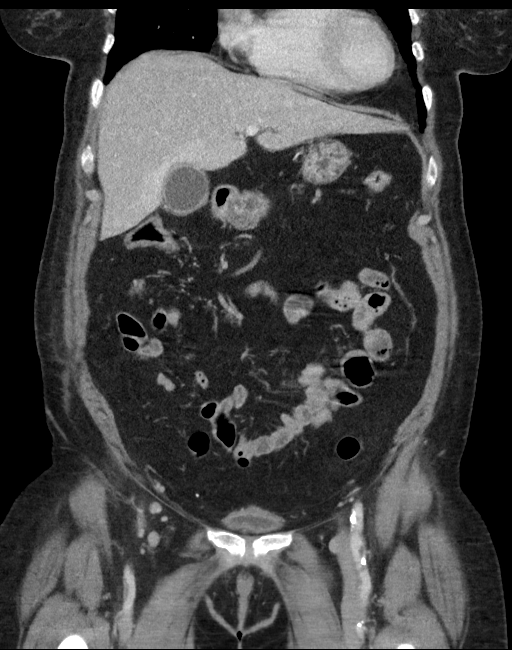
[im 41/93  soft-tissue]
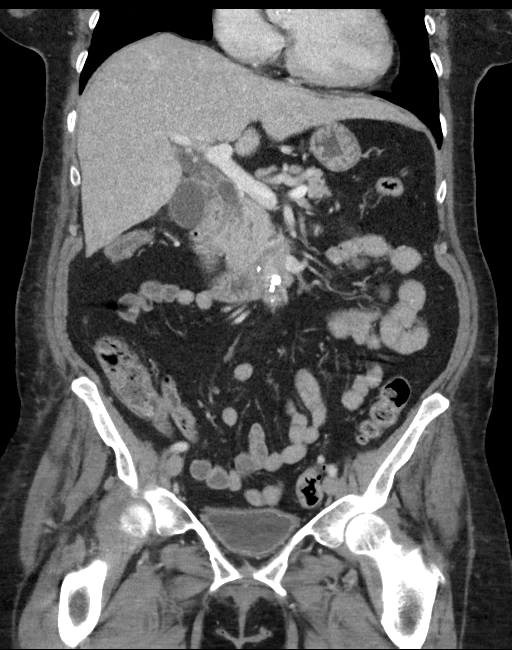
[im 52/93  soft-tissue]
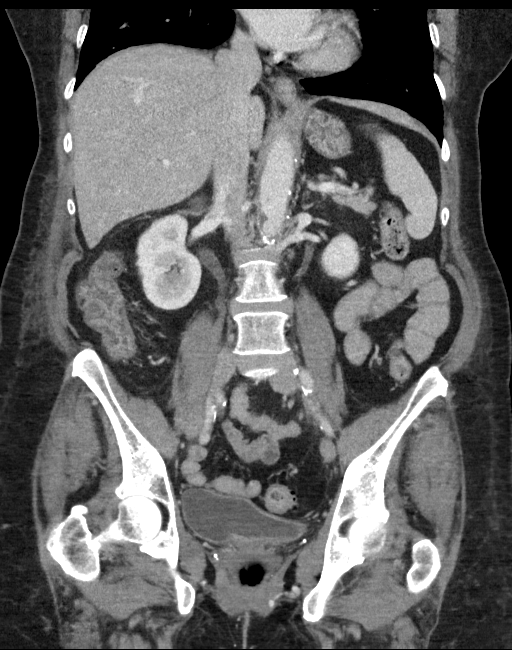

[16 of 46 positions shown; findings below may reference images not displayed]

FINDINGS: Lower chest: Mild bibasilar atelectatic changes seen.

Hepatobiliary: Fatty infiltration of the liver is again noted. The
gallbladder is again well distended. The degree of biliary
dilatation has improved from the prior exam. Common bile duct
appears slightly less prominent than that seen on the prior exam.

Pancreas: Pancreas is again well visualized. The degree of
inflammatory change surrounding the pancreatic head has improved
somewhat in the interval from the prior MRI. The degree of fluid
surrounding the duodenum has also improved.

Spleen: Normal in size without focal abnormality.

Adrenals/Urinary Tract: Adrenal glands are within normal limits.
Kidneys demonstrate a normal enhancement pattern. Cortical thinning
is noted on the left stable from the prior exam. This is likely
related to renal artery stenosis on the left. The bladder is
partially distended.

Stomach/Bowel: Diverticular change of the colon is noted without
evidence of diverticulitis. No obstructive changes are seen. The
appendix has been surgically removed. The small bowel and stomach
appear within normal limits.

Vascular/Lymphatic: Atherosclerotic calcifications of the abdominal
aorta are noted without aneurysmal dilatation.

Reproductive: Status post hysterectomy. No adnexal masses.

Other: No abdominal wall hernia or abnormality. No abdominopelvic
ascites.

Musculoskeletal: No acute or significant osseous findings. Stable
right breast nodule is noted.
IMPRESSION: The changes seen on recent MRI of pancreatitis have improved
significantly in the interval from the prior exam. The degree of
biliary dilatation has improved as well.

Diverticulosis without diverticulitis.

Stable right breast nodule

## 2020-04-06 MED ORDER — IOHEXOL 300 MG/ML  SOLN
100.0000 mL | Freq: Once | INTRAMUSCULAR | Status: AC | PRN
  Administered 2020-04-06: 100 mL via INTRAVENOUS

## 2020-04-06 MED ORDER — IOHEXOL 9 MG/ML PO SOLN
ORAL | Status: AC
  Filled 2020-04-06: qty 1000

## 2020-04-06 MED ORDER — SODIUM CHLORIDE (PF) 0.9 % IJ SOLN
INTRAMUSCULAR | Status: AC
  Filled 2020-04-06: qty 50

## 2020-04-06 MED ORDER — IOHEXOL 9 MG/ML PO SOLN: 500.0000 mL | ORAL | Status: AC

## 2020-04-06 NOTE — Evaluation (Signed)
Occupational Therapy Evaluation Patient Details Name: Robin Arellano MRN: [id number] DOB: 29-Sep-1951 Today's Date: 04/06/2020    History of Present Illness Robin Arellano is a 69 y.o. female with medical history significant for hypertension, anxiety, and fibromyalgia, now presenting to the emergency department with severe epigastric pain, nausea, and nonbloody vomiting.  Patient reports that she had been in her usual state of health until 03/27/2020 when she developed severe pain localized to the epigastrium.  This was accompanied by nausea and nonbloody vomiting.  Symptoms have been constant, worse shortly after attempting to eat or drink, and with no alleviating factors identified.   Clinical Impression   Patient with functional deficits listed below impacting safety/independence with self care. Patient with mild balance deficits, reaching out for furniture with min guard for safety with ambulation to bathroom and transferring to toilet. Educate pt on benefits of use of rolling walker until back to baseline. Will continue to follow.    Follow Up Recommendations  Home health OT;Supervision - Intermittent    Equipment Recommendations  Other (comment)(walker if does not have one)       Precautions / Restrictions Precautions Precautions: Other (comment) Precaution Comments: shingles Restrictions Weight Bearing Restrictions: No      Mobility Bed Mobility Overal bed mobility: Needs Assistance Bed Mobility: Supine to Sit;Sit to Supine     Supine to sit: Min assist;HOB elevated Sit to supine: Min guard;HOB elevated   General bed mobility comments: up to chair upon arrival  Transfers Overall transfer level: Needs assistance Equipment used: None Transfers: Sit to/from Stand Sit to Stand: Min guard         General transfer comment: cues for safety due to mild unsteadiness    Balance Overall balance assessment: Needs assistance Sitting-balance support: No upper extremity  supported Sitting balance-Leahy Scale: Good     Standing balance support: During functional activity;Single extremity supported;No upper extremity supported Standing balance-Leahy Scale: Fair Standing balance comment: pt able to maintain balance however reaches out for furniture at times, mildly unsteady                           ADL either performed or assessed with clinical judgement   ADL Overall ADL's : Needs assistance/impaired Eating/Feeding: Independent;Sitting   Grooming: Supervision/safety;Standing;Min guard;Wash/dry hands Grooming Details (indicate cue type and reason): min guard for safety as patient is mildly unsteady Upper Body Bathing: Set up;Sitting   Lower Body Bathing: Min guard;Sit to/from stand   Upper Body Dressing : Set up;Sitting   Lower Body Dressing: Min guard;Sit to/from stand Lower Body Dressing Details (indicate cue type and reason): pt able to doff/don socks seated in chair Toilet Transfer: Min guard;Cueing for Furniture conservator/restorer;Ambulation Toilet Transfer Details (indicate cue type and reason): mild unsteadiness patient stating she has numbness in foot due to ganglion cyst Toileting- Clothing Manipulation and Hygiene: Supervision/safety;Sitting/lateral lean       Functional mobility during ADLs: Min guard;Cueing for safety;Cueing for sequencing                    Pertinent Vitals/Pain Pain Assessment: 0-10 Pain Score: 6  Pain Location: head, back Pain Descriptors / Indicators: Discomfort Pain Intervention(s): Monitored during session     Hand Dominance Right   Extremity/Trunk Assessment Upper Extremity Assessment Upper Extremity Assessment: Overall WFL for tasks assessed   Lower Extremity Assessment Lower Extremity Assessment: Defer to PT evaluation   Cervical / Trunk Assessment Cervical / Trunk Assessment: Normal  Communication Communication Communication: No difficulties   Cognition Arousal/Alertness:  Awake/alert Behavior During Therapy: WFL for tasks assessed/performed Overall Cognitive Status: No family/caregiver present to determine baseline cognitive functioning                                 General Comments: mild safety deficits noted, patient reaching out for objects for stability educate patient on use of walker until more stable. pt also providing inconsistent info to OT/PT eval regarding home set up              [place] expects to be discharged to:: Private residence Living Arrangements: Spouse/significant other Available Help at Discharge: Family Type of Home: House Home Access: Stairs to enter [employer] of Steps: reports to PT 4 STE front, to OT none Entrance Stairs-Rails: Right;Left Home Layout: One level     Bathroom Shower/Tub: Teacher, early years/pre: Standard     Home Equipment: None   Additional Comments: inconsistencies from PT to OT, told OT she has a walker at home if needed      Prior Functioning/Environment Level of Independence: Independent                 OT Problem List: Decreased activity tolerance;Impaired balance (sitting and/or standing);Decreased safety awareness;Pain      OT Treatment/Interventions: Self-care/ADL training;Therapeutic exercise;DME and/or AE instruction;Therapeutic activities;Patient/family education;Balance training    OT Goals(Current goals can be found in the care plan section) Acute Rehab OT Goals Patient Stated Goal: To get out of the hospital OT Goal Formulation: With patient Time For Goal Achievement: 04/20/20 Potential to Achieve Goals: Good  OT Frequency: Min 2X/week    AM-PAC OT "6 Clicks" Daily Activity     Outcome Measure Help from another person eating meals?: None Help from another person taking care of personal grooming?: A Little Help from another person toileting, which includes using toliet, bedpan, or urinal?: A Little Help from  another person bathing (including washing, rinsing, drying)?: A Little Help from another person to put on and taking off regular upper body clothing?: A Little Help from another person to put on and taking off regular lower body clothing?: A Little 6 Click Score: 19   End of Session  Activity Tolerance: Patient tolerated treatment well Patient left: in chair;with call bell/phone within reach  OT Visit Diagnosis: Unsteadiness on feet (R26.81);Pain Pain - part of body: (head, back)                Time: [id number] OT Time Calculation (min): 17 min Charges:  OT General Charges $OT Visit: 1 Visit OT Evaluation $OT Eval Moderate Complexity: 1 Mod  Delbert Phenix OT Pager: [place] 04/06/2020, 12:29 PM

## 2020-04-06 NOTE — Evaluation (Signed)
Physical Therapy Evaluation Patient Details Name: Janellie Baillie MRN: [id number] DOB: 07/14/1951 Today's Date: 04/06/2020   History of Present Illness  Sharion Hutmacher is a 69 y.o. female with medical history significant for hypertension, anxiety, and fibromyalgia, now presenting to the emergency department with severe epigastric pain, nausea, and nonbloody vomiting.  Patient reports that she had been in her usual state of health until 03/27/2020 when she developed severe pain localized to the epigastrium.  This was accompanied by nausea and nonbloody vomiting.  Symptoms have been constant, worse shortly after attempting to eat or drink, and with no alleviating factors identified.  Clinical Impression  Pt presents with dependencies in mobility secondary to the above diagnosis. Pt needs encouragement to participate with therapy. Pt did have bilateral LE weakness and decreased activity tolerance. Recommend ambulation at least QD with nursing staff. Pt will continue to benefit from acute skilled PT to maximize mobility and independence for return home with spouse providing assistance as needed.    Follow Up Recommendations Home health PT    Equipment Recommendations  Rolling walker with 5" wheels    Recommendations for Other Services       Precautions / Restrictions Precautions Precautions: Other (comment) Precaution Comments: shingles Restrictions Weight Bearing Restrictions: No      Mobility  Bed Mobility Overal bed mobility: Needs Assistance Bed Mobility: Supine to Sit;Sit to Supine     Supine to sit: Min assist;HOB elevated Sit to supine: Min guard;HOB elevated   General bed mobility comments: pt reached out for support to sit up to EOB,  Transfers Overall transfer level: Needs assistance Equipment used: Rolling walker (2 wheeled) Transfers: Sit to/from Stand Sit to Stand: Min guard         General transfer comment: cues for hand placement  Ambulation/Gait Ambulation/Gait  assistance: Min guard Gait Distance (Feet): 125 Feet Assistive device: Rolling walker (2 wheeled) Gait Pattern/deviations: Step-through pattern;Decreased stride length Gait velocity: decreased   General Gait Details: standing rest breaks at times, a little distracted by environemnt,  Stairs            Wheelchair Mobility    Modified Rankin (Stroke Patients Only)       Balance Overall balance assessment: Needs assistance Sitting-balance support: No upper extremity supported Sitting balance-Leahy Scale: Good     Standing balance support: Bilateral upper extremity supported Standing balance-Leahy Scale: Fair                               Pertinent Vitals/Pain Pain Assessment: 0-10 Pain Score: 5  Pain Location: all over Pain Descriptors / Indicators: Discomfort Pain Intervention(s): Limited activity within patient's tolerance;Monitored during session;Repositioned    Home Living Family/patient expects to be discharged to:: Private residence Living Arrangements: Spouse/significant other Available Help at Discharge: Family Type of Home: House Home Access: Stairs to enter Entrance Stairs-Rails: Psychiatric nurse of Steps: 8-back, 4 in front Home Layout: One level Home Equipment: None      Prior Function Level of Independence: Independent               Hand Dominance        Extremity/Trunk Assessment   Upper Extremity Assessment Upper Extremity Assessment: Defer to OT evaluation    Lower Extremity Assessment Lower Extremity Assessment: Generalized weakness       Communication   Communication: No difficulties  Cognition Arousal/Alertness: Awake/alert Behavior During Therapy: WFL for tasks assessed/performed Overall Cognitive Status: Within Functional  Limits for tasks assessed                                        General Comments      Exercises     Assessment/Plan    PT Assessment Patient  needs continued PT services  PT Problem List Decreased strength;Decreased balance;Pain;Decreased mobility;Decreased range of motion;Decreased activity tolerance;Decreased safety awareness       PT Treatment Interventions DME instruction;Functional mobility training;Balance training;Patient/family education;Gait training;Therapeutic activities;Therapeutic exercise;Stair training    PT Goals (Current goals can be found in the Care Plan section)  Acute Rehab PT Goals Patient Stated Goal: To get out of the hospital PT Goal Formulation: With patient Time For Goal Achievement: 04/20/20 Potential to Achieve Goals: Good    Frequency Min 3X/week   Barriers to discharge        Co-evaluation               AM-PAC PT "6 Clicks" Mobility  Outcome Measure Help needed turning from your back to your side while in a flat bed without using bedrails?: A Little Help needed moving from lying on your back to sitting on the side of a flat bed without using bedrails?: A Little Help needed moving to and from a bed to a chair (including a wheelchair)?: A Little Help needed standing up from a chair using your arms (e.g., wheelchair or bedside chair)?: A Little Help needed to walk in hospital room?: A Little Help needed climbing 3-5 steps with a railing? : A Little 6 Click Score: 18    End of Session Equipment Utilized During Treatment: Gait belt Activity Tolerance: Patient tolerated treatment well Patient left: in bed;with family/visitor present;with call bell/phone within reach Nurse Communication: Mobility status PT Visit Diagnosis: Difficulty in walking, not elsewhere classified (R26.2);Muscle weakness (generalized) (M62.81)    Time: 1040-1107 PT Time Calculation (min) (ACUTE ONLY): 27 min   Charges:   PT Evaluation $PT Eval Low Complexity: 1 Low PT Treatments $Gait Training: 8-22 mins        Lelon Mast 04/06/2020, 11:15 AM

## 2020-04-06 NOTE — Progress Notes (Signed)
PT Cancellation Note  Patient Details Name: Robin Arellano MRN: [id number] DOB: 1950/12/09   Cancelled Treatment:    Reason Eval/Treat Not Completed: Other (comment) Pt was sitting in a recliner and attempted to decline PT today. Pt reported she wanted to eat and also had a scan scheduled. I will try again later this morning.   Lelon Mast 04/06/2020, 9:20 AM

## 2020-04-06 NOTE — Progress Notes (Signed)
PROGRESS NOTE  Catina Frenz [id number] DOB: [birth year] DOA: 03/31/2020 PCP: Sandi Mariscal, MD  HPI/Recap of past 24 hours: Patient is a 69 year old female with history of hypertension, anxiety, hyperlipidemia, fibromyalgia who presented to the emergency room with complaint of severe epigastric pain, nausea and vomiting. She describes the pain in the epigastric region accompanied by nausea and vomiting. No history of alcohol intake. No history of hepatobiliary disease. On presentation she was hypertensive hypokalemic, had mild AKI. Lipase was elevated to 5738. She had leukocytosis. CT abdomen/pelvis showed distended gallbladder with intra and extrahepatic biliary duct dilation, mild dilation of distal pancreatic duct, right breast nodule. Started on pain management, IV fluids. GI following. Underwent MRCPwith finding of dilated CBD.there is suspicion for choledocyst.GI planning for endoscopic ultrasound/ERCP.     Patient seen and examined at bedside.  Still reporting epigastric pain, with poor appetite.  Still requiring PCA but does report her pain is about a 5/10, she denies any other new complaints, denies any chest pain, shortness of breath, nausea/vomiting, fever/chills.    Assessment/Plan: Principal Problem:   Acute pancreatitis Active Problems:   Coronary artery calcification of native artery   Anxiety   Hypokalemia   Mild renal insufficiency   Breast nodule   Elevated troponin   Elevated liver enzymes   Abnormal magnetic resonance imaging of abdomen   Common bile duct dilation  Acute pancreatitis, unclear etiology Presented with lipase greater than 5700 and epigastric pain.  CT showed CBD.  MRCP with no demonstrable stone Lipase level back down to normal Repeat CT shows improvement GI following and recommends to continue IV fluids with pain management Advance to full liquid  B/L flank pain, unclear etiology Afebrile, with no leukocytosis Unremarkable CT abd  pelvis UA positive for pyuria on 03/31/20, UC pending  Essential hypertension BP is stable Continue Norvasc 10 mg daily, lisinopril 5 mg daily, Toprol-XL 50 mg daily Continue to monitor vital signs.  Chronic back pain/physical debility PT OT to assess Mobilize as tolerated Fall precautions  Right breast nodule, incidental finding Outpatient mammogram recommended  Iron deficiency anemia/chronic macrocytic anemia Baseline hemoglobin appears to be 10 No overt bleeding, suspect dilutional component Continue Iron supplement 325 mg daily  DVT prophylaxis:  Subcu heparin  Code Status: Full code  Family Communication:  Discussed extensively with patient    Consultants:   GI  General surgery  Procedures:  none    Status is: Inpatient   Dispo: The patient is from: Home.               Anticipated d/c is to: Home in the next 48 to 72 hours.              Anticipated d/c date is: 04/07/2020              Patient currently ongoing management for pancreatitis, not able to tolerate p.o. adequately     Objective: Vitals:   04/06/20 1304 04/06/20 1343 04/06/20 1643 04/06/20 1700  BP: (!) 156/65 (!) 151/55 (!) 135/56   Pulse: 60 (!) 58 (!) 51   Resp: 12 16 12 16   Temp: 98.3 F (36.8 C) 98.6 F (37 C) 97.7 F (36.5 C)   TempSrc: Oral Oral Axillary   SpO2: 97% 95% 97% 97%  Weight:      Height:        Intake/Output Summary (Last 24 hours) at 04/06/2020 1830 Last data filed at 04/06/2020 1714 Gross per 24 hour  Intake 1480 ml  Output 1800  ml  Net -320 ml   Filed Weights   04/01/20 0246  Weight: 76.9 kg    Exam:  General: NAD   Cardiovascular: S1, S2 present  Respiratory: CTAB  Abdomen: Soft, TTP at epigastric region, nondistended, bowel sounds present  Musculoskeletal: No bilateral pedal edema noted  Skin: Normal  Psychiatry: Normal mood   Data Reviewed: CBC: Recent Labs  Lab 04/01/20 0334 04/01/20 0334 04/02/20 0425 04/03/20 0327  04/04/20 0357 04/05/20 0621 04/06/20 0350  WBC 13.5*   < > 16.0* 11.5* 9.2 8.9 8.4  NEUTROABS 10.6*  --  12.9* 7.7 5.8  --   --   HGB 10.6*   < > 10.6* 9.3* 8.7* 9.2* 9.3*  HCT 31.6*   < > 32.2* 28.9* 27.3* 28.9* 29.4*  MCV 96.6   < > 97.3 98.6 101.1* 103.2* 101.0*  PLT 241   < > 251 219 233 233 249   < > = values in this interval not displayed.   Basic Metabolic Panel: Recent Labs  Lab 04/01/20 0334 04/01/20 0334 04/02/20 0425 04/03/20 0327 04/04/20 0357 04/05/20 0621 04/06/20 0350  NA 134*   < > 137 140 138 137 137  K 3.0*   < > 3.3* 2.8* 3.4* 4.3 4.2  CL 103   < > 106 107 108 105 103  CO2 22   < > 23 23 22 23 26   GLUCOSE 110*   < > 102* 88 74 75 90  BUN 15   < > 9 8 8  6* 6*  CREATININE 0.79   < > 0.72 0.84 0.79 0.91 0.90  CALCIUM 8.3*   < > 8.6* 8.5* 8.2* 8.3* 9.1  MG 2.1  --   --   --  1.7  --   --   PHOS  --   --   --   --  3.8  --   --    < > = values in this interval not displayed.   GFR: Estimated Creatinine Clearance: 56.6 mL/min (by C-G formula based on SCr of 0.9 mg/dL). Liver Function Tests: Recent Labs  Lab 04/02/20 0425 04/03/20 0327 04/04/20 0357 04/05/20 0621 04/06/20 0350  AST 157* 59* 30 23 21   ALT 206* 125* 80* 61* 50*  ALKPHOS 147* 124 103 107 109  BILITOT 1.1 0.8 0.4 0.5 0.2*  PROT 6.5 6.2* 5.8* 6.2* 6.7  ALBUMIN 3.2* 3.0* 2.9* 2.9* 3.3*   Recent Labs  Lab 04/01/20 0334 04/02/20 0425 04/04/20 0357 04/05/20 0621 04/06/20 0350  LIPASE 1,724* 203* 39 36 35   No results for input(s): AMMONIA in the last 168 hours. Coagulation Profile: No results for input(s): INR, PROTIME in the last 168 hours. Cardiac Enzymes: No results for input(s): CKTOTAL, CKMB, CKMBINDEX, TROPONINI in the last 168 hours. BNP (last 3 results) No results for input(s): PROBNP in the last 8760 hours. HbA1C: No results for input(s): HGBA1C in the last 72 hours. CBG: No results for input(s): GLUCAP in the last 168 hours. Lipid Profile: No results for input(s):  CHOL, HDL, LDLCALC, TRIG, CHOLHDL, LDLDIRECT in the last 72 hours. Thyroid Function Tests: No results for input(s): TSH, T4TOTAL, FREET4, T3FREE, THYROIDAB in the last 72 hours. Anemia Panel: No results for input(s): VITAMINB12, FOLATE, FERRITIN, TIBC, IRON, RETICCTPCT in the last 72 hours. Urine analysis:    Component Value Date/Time   COLORURINE YELLOW 03/31/2020 2330   APPEARANCEUR CLEAR 03/31/2020 2330   LABSPEC 1.011 03/31/2020 2330   PHURINE 6.0 03/31/2020 2330   GLUCOSEU  NEGATIVE 03/31/2020 2330   HGBUR NEGATIVE 03/31/2020 2330   BILIRUBINUR NEGATIVE 03/31/2020 2330   KETONESUR NEGATIVE 03/31/2020 2330   PROTEINUR 100 (A) 03/31/2020 2330   UROBILINOGEN 0.2 05/26/2014 1158   NITRITE POSITIVE (A) 03/31/2020 2330   LEUKOCYTESUR NEGATIVE 03/31/2020 2330   Sepsis Labs: @LABRCNTIP (procalcitonin:4,lacticidven:4)  ) Recent Results (from the past 240 hour(s))  SARS Coronavirus 2 by RT PCR (hospital order, performed in [facility] hospital lab) Nasopharyngeal Nasopharyngeal Swab     Status: None   Collection Time: 04/01/20 12:46 AM   Specimen: Nasopharyngeal Swab  Result Value Ref Range Status   SARS Coronavirus 2 NEGATIVE NEGATIVE Final    Comment: (NOTE) SARS-CoV-2 target nucleic acids are NOT DETECTED. The SARS-CoV-2 RNA is generally detectable in upper and lower respiratory specimens during the acute phase of infection. The lowest concentration of SARS-CoV-2 viral copies this assay can detect is 250 copies / mL. A negative result does not preclude SARS-CoV-2 infection and should not be used as the sole basis for treatment or other patient management decisions.  A negative result may occur with improper specimen collection / handling, submission of specimen other than nasopharyngeal swab, presence of viral mutation(s) within the areas targeted by this assay, and inadequate number of viral copies (<250 copies / mL). A negative result must be combined with  clinical observations, patient history, and epidemiological information. Fact Sheet for Patients:   StrictlyIdeas.no Fact Sheet for Healthcare Providers: BankingDealers.co.za This test is not yet approved or cleared  by the [place] FDA and has been authorized for detection and/or diagnosis of SARS-CoV-2 by FDA under an Emergency Use Authorization (EUA).  This EUA will remain in effect (meaning this test can be used) for the duration of the COVID-19 declaration under Section 564(b)(1) of the Act, 21 U.S.C. section 360bbb-3(b)(1), unless the authorization is terminated or revoked sooner. Performed at [facility], [place] [address], [place], [place] [address]       Studies: CT ABDOMEN PELVIS W CONTRAST  Result Date: 04/06/2020 CLINICAL DATA:  Worsening epigastric pain EXAM: CT ABDOMEN AND PELVIS WITH CONTRAST TECHNIQUE: Multidetector CT imaging of the abdomen and pelvis was performed using the standard protocol following bolus administration of intravenous contrast. CONTRAST:  118mL OMNIPAQUE IOHEXOL 300 MG/ML  SOLN COMPARISON:  03/31/2020 CT, 04/01/2020 MRI FINDINGS: Lower chest: Mild bibasilar atelectatic changes seen. Hepatobiliary: Fatty infiltration of the liver is again noted. The gallbladder is again well distended. The degree of biliary dilatation has improved from the prior exam. Common bile duct appears slightly less prominent than that seen on the prior exam. Pancreas: Pancreas is again well visualized. The degree of inflammatory change surrounding the pancreatic head has improved somewhat in the interval from the prior MRI. The degree of fluid surrounding the duodenum has also improved. Spleen: Normal in size without focal abnormality. Adrenals/Urinary Tract: Adrenal glands are within normal limits. Kidneys demonstrate a normal enhancement pattern. Cortical thinning is noted on the left stable from the prior exam.  This is likely related to renal artery stenosis on the left. The bladder is partially distended. Stomach/Bowel: Diverticular change of the colon is noted without evidence of diverticulitis. No obstructive changes are seen. The appendix has been surgically removed. The small bowel and stomach appear within normal limits. Vascular/Lymphatic: Atherosclerotic calcifications of the abdominal aorta are noted without aneurysmal dilatation. Reproductive: Status post hysterectomy. No adnexal masses. Other: No abdominal wall hernia or abnormality. No abdominopelvic ascites. Musculoskeletal: No acute or significant osseous findings. Stable right  breast nodule is noted. IMPRESSION: The changes seen on recent MRI of pancreatitis have improved significantly in the interval from the prior exam. The degree of biliary dilatation has improved as well. Diverticulosis without diverticulitis. Stable right breast nodule Electronically Signed   By: Inez Catalina M.D.   On: 04/06/2020 16:21    Scheduled Meds: . amLODipine  10 mg Oral Daily  . capsicum   Topical BID  . ferrous sulfate  325 mg Oral Q breakfast  . heparin injection (subcutaneous)  5,000 Units Subcutaneous Q12H  . HYDROmorphone   Intravenous Q4H  . iohexol      . lisinopril  5 mg Oral Daily  . metoprolol succinate  50 mg Oral Daily  . sodium chloride (PF)      . sodium chloride flush  3 mL Intravenous Q12H    Continuous Infusions: . dextrose 5% lactated ringers 50 mL/hr at 04/06/20 [id number]     LOS: 5 days     Alma Friendly, MD [facility]  If 7PM-7AM, please contact night-coverage [email or web address] 04/06/2020, 6:30 PM

## 2020-04-06 NOTE — Progress Notes (Addendum)
    Progress Note   Subjective  Day #6  Chief Complaint: Acute Pancreatitis  Patient states that she was able to handle a whole bowl of soup last night and tells me she is nauseous this morning but awaiting her clear liquid diet.  She is eager to eat and hungry.  Tells me she thought her diet was being advanced but when she called down this morning there was still just clear liquids.  Explains that her pain has increased since yesterday in her epigastric area rates it as an 8-9/10.  Tells me "it is not as bad as when I came in last week but it is worse than it was yesterday or the day before".  Also has developed a shingles rash on her back which she tells me was a reaction from the Lovenox shots which hurts and itches.   Objective   Vital signs in last 24 hours: Temp:  [98.4 F (36.9 C)-100 F (37.8 C)] 98.8 F (37.1 C) (05/26 [id number]) Pulse Rate:  [60-68] 68 (05/26 0632) Resp:  [13-20] 16 (05/26 [id number]) BP: (147-174)/(58-74) 148/58 (05/26 [id number]) SpO2:  [94 %-98 %] 97 % (05/26 [id number]) Last BM Date: 04/03/20(on clear liquids for now) General:    white female in NAD Heart:  Regular rate and rhythm; no murmurs Lungs: Respirations even and unlabored, lungs CTA bilaterally Abdomen:  Soft, marked epigastric ttp and nondistended. Normal bowel sounds.+echymosis inferior to belly button (area of Lovenox shots per pt) Extremities:  Without edema. Neurologic:  Alert and oriented,  grossly normal neurologically. Psych:  Cooperative. Normal mood and affect.  Intake/Output from previous day: 05/25 0701 - 05/26 0700 In: 1400 [P.O.:400; I.V.:1000] Out: 4250 [Urine:4250]  Lab Results: Recent Labs    04/04/20 0357 04/05/20 0621 04/06/20 0350  WBC 9.2 8.9 8.4  HGB 8.7* 9.2* 9.3*  HCT 27.3* 28.9* 29.4*  PLT 233 233 249   BMET Recent Labs    04/04/20 0357 04/05/20 0621 04/06/20 0350  NA 138 137 137  K 3.4* 4.3 4.2  CL 108 105 103  CO2 22 23 26   GLUCOSE 74 75 90  BUN 8 6* 6*  CREATININE  0.79 0.91 0.90  CALCIUM 8.2* 8.3* 9.1   LFT Recent Labs    04/06/20 0350  PROT 6.7  ALBUMIN 3.3*  AST 21  ALT 50*  ALKPHOS 109  BILITOT 0.2*     Assessment / Plan:   Assessment: 1. Acute Pancreatitis: unclear etiology, elevated LFT's on admission-no stones in gallbladder and CBD dilated, pain increasing over the past 48 hours, is tolerating a clear liquid diet for the most part, continues with some nausea, has still required her PCA, tells me she pushed it twice this morning 2.  Shingles rash: On back, patient tells me the Lovenox activated her dormant shingles  Plan: 1.  Will order CT abdomen pelvis with contrast today for further evaluation of increasing pain. 2.  Patient will remain on clears this morning.  May be able to increase pending results of CT and how she does with breakfast. 3.  Continue other supportive measures 4.  Please await any further recommendations later today from Dr. Tarri Glenn.  Thank you for your kind consultation, we will continue to follow.    LOS: 5 days   [name]  04/06/2020, 8:53 AM

## 2020-04-06 NOTE — Progress Notes (Signed)
Oral contrast delivered to patient. Education provided. Pt is not agreeable to drinking contrast at this time.

## 2020-04-07 LAB — CBC
HCT: 28.1 % — ABNORMAL LOW (ref 36.0–46.0)
Hemoglobin: 8.9 g/dL — ABNORMAL LOW (ref 12.0–15.0)
MCH: 32 pg (ref 26.0–34.0)
MCHC: 31.7 g/dL (ref 30.0–36.0)
MCV: 101.1 fL — ABNORMAL HIGH (ref 80.0–100.0)
Platelets: 244 10*3/uL (ref 150–400)
RBC: 2.78 MIL/uL — ABNORMAL LOW (ref 3.87–5.11)
RDW: 13.5 % (ref 11.5–15.5)
WBC: 7.5 10*3/uL (ref 4.0–10.5)
nRBC: 0 % (ref 0.0–0.2)

## 2020-04-07 LAB — COMPREHENSIVE METABOLIC PANEL
ALT: 40 U/L (ref 0–44)
AST: 20 U/L (ref 15–41)
Albumin: 3 g/dL — ABNORMAL LOW (ref 3.5–5.0)
Alkaline Phosphatase: 104 U/L (ref 38–126)
Anion gap: 13 (ref 5–15)
BUN: 5 mg/dL — ABNORMAL LOW (ref 8–23)
CO2: 25 mmol/L (ref 22–32)
Calcium: 8.4 mg/dL — ABNORMAL LOW (ref 8.9–10.3)
Chloride: 98 mmol/L (ref 98–111)
Creatinine, Ser: 0.88 mg/dL (ref 0.44–1.00)
GFR calc Af Amer: >60 mL/min (ref >60)
GFR calc non Af Amer: >60 mL/min (ref >60)
Glucose, Bld: 96 mg/dL (ref 70–99)
Potassium: 3.9 mmol/L (ref 3.5–5.1)
Sodium: 136 mmol/L (ref 135–145)
Total Bilirubin: 0.6 mg/dL (ref 0.3–1.2)
Total Protein: 6.6 g/dL (ref 6.5–8.1)

## 2020-04-07 MED ORDER — ALPRAZOLAM 0.5 MG PO TABS
0.5000 mg | ORAL_TABLET | Freq: Every day | ORAL | Status: DC
  Administered 2020-04-07: 0.5 mg via ORAL
  Filled 2020-04-07: qty 1

## 2020-04-07 MED ORDER — SODIUM CHLORIDE 0.9 % IV SOLN
INTRAVENOUS | Status: DC | PRN
  Administered 2020-04-07: 250 mL via INTRAVENOUS

## 2020-04-07 MED ORDER — BOOST / RESOURCE BREEZE PO LIQD CUSTOM
1.0000 | Freq: Two times a day (BID) | ORAL | Status: DC
  Administered 2020-04-07: 1 via ORAL

## 2020-04-07 MED ORDER — HYDROCODONE-ACETAMINOPHEN 5-325 MG PO TABS
1.0000 | ORAL_TABLET | Freq: Four times a day (QID) | ORAL | Status: DC | PRN
  Administered 2020-04-07: 1 via ORAL
  Administered 2020-04-07: 2 via ORAL
  Administered 2020-04-08: 1 via ORAL
  Administered 2020-04-08: 2 via ORAL
  Filled 2020-04-07 (×4): qty 2

## 2020-04-07 MED ORDER — ADULT MULTIVITAMIN W/MINERALS CH
1.0000 | ORAL_TABLET | Freq: Every day | ORAL | Status: DC
  Administered 2020-04-07 – 2020-04-08 (×2): 1 via ORAL
  Filled 2020-04-07 (×2): qty 1

## 2020-04-07 MED ORDER — PROMETHAZINE HCL 25 MG PO TABS
12.5000 mg | ORAL_TABLET | Freq: Four times a day (QID) | ORAL | Status: DC | PRN
  Administered 2020-04-07 – 2020-04-08 (×2): 12.5 mg via ORAL
  Filled 2020-04-07 (×2): qty 1

## 2020-04-07 MED ORDER — SODIUM CHLORIDE 0.9 % IV SOLN
1.0000 g | Freq: Every day | INTRAVENOUS | Status: DC
  Administered 2020-04-07: 1 g via INTRAVENOUS
  Filled 2020-04-07: qty 1
  Filled 2020-04-07: qty 10

## 2020-04-07 NOTE — Progress Notes (Signed)
PROGRESS NOTE  Emanuel Karpf [id number] DOB: 1951-05-04 DOA: 03/31/2020 PCP: Sandi Mariscal, MD  HPI/Recap of past 24 hours: Patient is a 69 year old female with history of hypertension, anxiety, hyperlipidemia, fibromyalgia who presented to the emergency room with complaint of severe epigastric pain, nausea and vomiting. She describes the pain in the epigastric region accompanied by nausea and vomiting. No history of alcohol intake. No history of hepatobiliary disease. On presentation she was hypertensive hypokalemic, had mild AKI. Lipase was elevated to 5738. She had leukocytosis. CT abdomen/pelvis showed distended gallbladder with intra and extrahepatic biliary duct dilation, mild dilation of distal pancreatic duct, right breast nodule. Started on pain management, IV fluids. GI following. Underwent MRCPwith finding of dilated CBD.there is suspicion for choledocyst.GI planning for endoscopic ultrasound/ERCP.     Patient seen and examined at bedside, reports abdominal pain is improving, as well as nausea denies any vomiting.  Denies any chest pain, shortness of breath, fever/chills.    Assessment/Plan: Principal Problem:   Acute pancreatitis Active Problems:   Coronary artery calcification of native artery   Anxiety   Hypokalemia   Mild renal insufficiency   Breast nodule   Elevated troponin   Elevated liver enzymes   Abnormal magnetic resonance imaging of abdomen   Common bile duct dilation  Acute pancreatitis, unclear etiology Presented with lipase greater than 5700 and epigastric pain.  CT showed CBD.  MRCP with no demonstrable stone Lipase level back down to normal Repeat CT shows improvement GI following, appreciate recs Advance to full liquid  UTI with E. Coli Complained of bilateral flank pain Afebrile, with no leukocytosis UA positive for pyuria on 03/31/20, UC grew E. Coli Unremarkable CT abd pelvis Start IV ceftriaxone  Essential hypertension BP is  stable Continue Norvasc 10 mg daily, lisinopril 5 mg daily, Toprol-XL 50 mg daily Continue to monitor vital signs.  Chronic back pain/physical debility PT OT to assess Mobilize as tolerated Fall precautions  Right breast nodule, incidental finding Outpatient mammogram recommended  Iron deficiency anemia/chronic macrocytic anemia Baseline hemoglobin appears to be 10 No overt bleeding, suspect dilutional component Continue Iron supplement 325 mg daily  DVT prophylaxis:  Subcu heparin  Code Status: Full code  Family Communication:  Discussed extensively with patient    Consultants:   GI  General surgery  Procedures:  none    Status is: Inpatient   Dispo: The patient is from: Home.               Anticipated d/c is to: Home in the next 48 to 72 hours.              Anticipated d/c date is: 04/08/2020              Patient currently ongoing management for pancreatitis, advancing diet     Objective: Vitals:   04/07/20 0415 04/07/20 0539 04/07/20 0741 04/07/20 1240  BP:  (!) 154/57  (!) 157/62  Pulse:  61  68  Resp: 14 18 18 16   Temp:  98.8 F (37.1 C)  99.7 F (37.6 C)  TempSrc:  Oral  Oral  SpO2: 97% 94% 95% 94%  Weight:      Height:        Intake/Output Summary (Last 24 hours) at 04/07/2020 1919 Last data filed at 04/07/2020 0744 Gross per 24 hour  Intake 1588.96 ml  Output 200 ml  Net 1388.96 ml   Filed Weights   04/01/20 0246  Weight: 76.9 kg    Exam:  General:  NAD   Cardiovascular: S1, S2 present  Respiratory: CTAB  Abdomen: Soft, NT, nondistended, bowel sounds present  Musculoskeletal: No bilateral pedal edema noted  Skin: Normal  Psychiatry: Normal mood   Data Reviewed: CBC: Recent Labs  Lab 04/01/20 0334 04/01/20 0334 04/02/20 0425 04/02/20 0425 04/03/20 0327 04/04/20 0357 04/05/20 0621 04/06/20 0350 04/07/20 0424  WBC 13.5*   < > 16.0*   < > 11.5* 9.2 8.9 8.4 7.5  NEUTROABS 10.6*  --  12.9*  --  7.7 5.8  --   --    --   HGB 10.6*   < > 10.6*   < > 9.3* 8.7* 9.2* 9.3* 8.9*  HCT 31.6*   < > 32.2*   < > 28.9* 27.3* 28.9* 29.4* 28.1*  MCV 96.6   < > 97.3   < > 98.6 101.1* 103.2* 101.0* 101.1*  PLT 241   < > 251   < > 219 233 233 249 244   < > = values in this interval not displayed.   Basic Metabolic Panel: Recent Labs  Lab 04/01/20 0334 04/02/20 0425 04/03/20 0327 04/04/20 0357 04/05/20 0621 04/06/20 0350 04/07/20 0424  NA 134*   < > 140 138 137 137 136  K 3.0*   < > 2.8* 3.4* 4.3 4.2 3.9  CL 103   < > 107 108 105 103 98  CO2 22   < > 23 22 23 26 25   GLUCOSE 110*   < > 88 74 75 90 96  BUN 15   < > 8 8 6* 6* 5*  CREATININE 0.79   < > 0.84 0.79 0.91 0.90 0.88  CALCIUM 8.3*   < > 8.5* 8.2* 8.3* 9.1 8.4*  MG 2.1  --   --  1.7  --   --   --   PHOS  --   --   --  3.8  --   --   --    < > = values in this interval not displayed.   GFR: Estimated Creatinine Clearance: 57.9 mL/min (by C-G formula based on SCr of 0.88 mg/dL). Liver Function Tests: Recent Labs  Lab 04/03/20 0327 04/04/20 0357 04/05/20 0621 04/06/20 0350 04/07/20 0424  AST 59* 30 23 21 20   ALT 125* 80* 61* 50* 40  ALKPHOS 124 103 107 109 104  BILITOT 0.8 0.4 0.5 0.2* 0.6  PROT 6.2* 5.8* 6.2* 6.7 6.6  ALBUMIN 3.0* 2.9* 2.9* 3.3* 3.0*   Recent Labs  Lab 04/01/20 0334 04/02/20 0425 04/04/20 0357 04/05/20 0621 04/06/20 0350  LIPASE 1,724* 203* 39 36 35   No results for input(s): AMMONIA in the last 168 hours. Coagulation Profile: No results for input(s): INR, PROTIME in the last 168 hours. Cardiac Enzymes: No results for input(s): CKTOTAL, CKMB, CKMBINDEX, TROPONINI in the last 168 hours. BNP (last 3 results) No results for input(s): PROBNP in the last 8760 hours. HbA1C: No results for input(s): HGBA1C in the last 72 hours. CBG: No results for input(s): GLUCAP in the last 168 hours. Lipid Profile: No results for input(s): CHOL, HDL, LDLCALC, TRIG, CHOLHDL, LDLDIRECT in the last 72 hours. Thyroid Function  Tests: No results for input(s): TSH, T4TOTAL, FREET4, T3FREE, THYROIDAB in the last 72 hours. Anemia Panel: No results for input(s): VITAMINB12, FOLATE, FERRITIN, TIBC, IRON, RETICCTPCT in the last 72 hours. Urine analysis:    Component Value Date/Time   COLORURINE YELLOW 03/31/2020 2330   APPEARANCEUR CLEAR 03/31/2020 2330   LABSPEC 1.011 03/31/2020 2330  PHURINE 6.0 03/31/2020 2330   GLUCOSEU NEGATIVE 03/31/2020 2330   HGBUR NEGATIVE 03/31/2020 2330   BILIRUBINUR NEGATIVE 03/31/2020 2330   KETONESUR NEGATIVE 03/31/2020 2330   PROTEINUR 100 (A) 03/31/2020 2330   UROBILINOGEN 0.2 05/26/2014 1158   NITRITE POSITIVE (A) 03/31/2020 2330   LEUKOCYTESUR NEGATIVE 03/31/2020 2330   Sepsis Labs: @LABRCNTIP (procalcitonin:4,lacticidven:4)  ) Recent Results (from the past 240 hour(s))  SARS Coronavirus 2 by RT PCR (hospital order, performed in [facility] hospital lab) Nasopharyngeal Nasopharyngeal Swab     Status: None   Collection Time: 04/01/20 12:46 AM   Specimen: Nasopharyngeal Swab  Result Value Ref Range Status   SARS Coronavirus 2 NEGATIVE NEGATIVE Final    Comment: (NOTE) SARS-CoV-2 target nucleic acids are NOT DETECTED. The SARS-CoV-2 RNA is generally detectable in upper and lower respiratory specimens during the acute phase of infection. The lowest concentration of SARS-CoV-2 viral copies this assay can detect is 250 copies / mL. A negative result does not preclude SARS-CoV-2 infection and should not be used as the sole basis for treatment or other patient management decisions.  A negative result may occur with improper specimen collection / handling, submission of specimen other than nasopharyngeal swab, presence of viral mutation(s) within the areas targeted by this assay, and inadequate number of viral copies (<250 copies / mL). A negative result must be combined with clinical observations, patient history, and epidemiological information. Fact Sheet for Patients:    StrictlyIdeas.no Fact Sheet for Healthcare Providers: BankingDealers.co.za This test is not yet approved or cleared  by the [place] FDA and has been authorized for detection and/or diagnosis of SARS-CoV-2 by FDA under an Emergency Use Authorization (EUA).  This EUA will remain in effect (meaning this test can be used) for the duration of the COVID-19 declaration under Section 564(b)(1) of the Act, 21 U.S.C. section 360bbb-3(b)(1), unless the authorization is terminated or revoked sooner. Performed at [facility], [place] [address], [place], [place] [address]   Culture, Urine     Status: Abnormal (Preliminary result)   Collection Time: 04/05/20  6:17 PM   Specimen: Urine, Random  Result Value Ref Range Status   Specimen Description   Final    URINE, RANDOM Performed at [facility] [address] [place], [place] [address]    Special Requests   Final    NONE Performed at [facility], [place] [address] [place], [place] [address]    Culture >=100,000 COLONIES/mL ESCHERICHIA COLI (A)  Final   Report Status PENDING  Incomplete      Studies: No results found.  Scheduled Meds: . ALPRAZolam  0.5 mg Oral QHS  . amLODipine  10 mg Oral Daily  . capsicum   Topical BID  . feeding supplement  1 Container Oral BID BM  . ferrous sulfate  325 mg Oral Q breakfast  . heparin injection (subcutaneous)  5,000 Units Subcutaneous Q12H  . lisinopril  5 mg Oral Daily  . metoprolol succinate  50 mg Oral Daily  . multivitamin with minerals  1 tablet Oral Daily    Continuous Infusions:    LOS: 6 days     Alma Friendly, MD [facility]  If 7PM-7AM, please contact night-coverage [email or web address] 04/07/2020, 7:19 PM

## 2020-04-07 NOTE — TOC Progression Note (Signed)
Transition of Care [facility]) - Progression Note    Patient Details  Name: Robin Arellano MRN: [id number] Date of Birth: 02-07-1951  Transition of [facility]) CM/SW Contact  Purcell Mouton, RN Phone Number: 04/07/2020, 3:17 PM  Clinical Narrative:    TOC will continue to follow for home health needs.   Expected Discharge Plan: Home/Self Care Barriers to Discharge: No Barriers Identified  Expected Discharge Plan and Services Expected Discharge Plan: Home/Self Care       Living arrangements for the past 2 months: Single Family Home                                       Social Determinants of Health (SDOH) Interventions    Readmission Risk Interventions No flowsheet data found.

## 2020-04-07 NOTE — Progress Notes (Signed)
Nutrition Follow-up  RD working remotely.   DOCUMENTATION CODES:   Obesity unspecified  INTERVENTION:  - will order Boost Breeze BID, each supplement provides 250 kcal and 9 grams of protein. - will order Magic Cup BID with meals, each supplement provides 290 kcal and 9 grams of protein. - diet advancement as medically feasible.  NUTRITION DIAGNOSIS:   Increased nutrient needs related to acute illness(pancreatitis) as evidenced by estimated needs. -ongoing  GOAL:   Patient will meet greater than or equal to 90% of their needs -unmet  MONITOR:   PO intake, Supplement acceptance, Diet advancement, Labs, Weight trends  ASSESSMENT:   69 y.o. female with medical history of HTN, anxiety, and fibromyalgia. She presented to the ED with severe epigastric pain, nausea, and non-bloody vomiting which began on 5/16. Symptoms have been constant, worse shortly after eating or drinking, and no alleviating factors.  Diet advanced from NPO to CLD on 5/24 at 27 and to [place] yesterday at 1830. She consumed 75% of dinner yesterday prior to diet advancement. She has not been weighed since admission on 5/21.  Per notes: - acute pancreatitis--s/p MRCP with no stones found; lipase WDL - chronic back pain and physical debility - R breast nodule - iron deficiency anemia/macrocytic anemia - MD note 5/26 stated hopeful d/c home in 48-72 hours    Labs reviewed; BUN: 5 mg/dl, Ca: 8.4 mg/dl. Medications reviewed; 325 mg ferrous sulfate/day.  IVF; D5-LR @ 50 ml/hr (204 kcal).   Diet Order:   Diet Order            Diet full liquid Room service appropriate? Yes; Fluid consistency: Thin  Diet effective now              EDUCATION NEEDS:   Not appropriate for education at this time  Skin:  Skin Assessment: Reviewed RN Assessment  Last BM:  5/23  Height:   Ht Readings from Last 1 Encounters:  04/01/20 5\' 2"  (1.575 m)    Weight:   Wt Readings from Last 1 Encounters:  04/01/20 76.9 kg     Estimated Nutritional Needs:  Kcal:  2000-2200 kcal Protein:  110-125 grams Fluid:  >/= 2.2 L/day     Jarome Matin, MS, RD, LDN, CNSC Inpatient Clinical Dietitian RD pager # available in AMION  After hours/weekend pager # available in [facility]

## 2020-04-07 NOTE — Progress Notes (Addendum)
Progress Note   Subjective  Day #7  Chief Complaint: Acute Pancreatitis  Today, patient is found in bed having almost cleaned her tray this morning including grits.  Tells me that he ate she is a little bit nauseous and has a lot of back pain related to this rash that has developed, but overall is feeling better.  Tells me that she wants to get home.   Objective   Vital signs in last 24 hours: Temp:  [97.7 F (36.5 C)-98.8 F (37.1 C)] 98.8 F (37.1 C) (05/27 0539) Pulse Rate:  [51-67] 61 (05/27 0539) Resp:  [12-24] 18 (05/27 0741) BP: (135-164)/(55-70) 154/57 (05/27 0539) SpO2:  [94 %-97 %] 95 % (05/27 0741) Last BM Date: 04/03/20 General:    white female in NAD Heart:  Regular rate and rhythm; no murmurs Lungs: Respirations even and unlabored, lungs CTA bilaterally Abdomen:  Soft, moderate epigastric ttp and nondistended. Normal bowel sounds. Neurologic:  Alert and oriented,  grossly normal neurologically. Psych:  Cooperative. Normal mood and affect.  Intake/Output from previous day: 05/26 0701 - 05/27 0700 In: 1709 [P.O.:480; I.V.:1229] Out: 500 [Urine:500]  Lab Results: Recent Labs    04/05/20 0621 04/06/20 0350 04/07/20 0424  WBC 8.9 8.4 7.5  HGB 9.2* 9.3* 8.9*  HCT 28.9* 29.4* 28.1*  PLT 233 249 244   BMET Recent Labs    04/05/20 0621 04/06/20 0350 04/07/20 0424  NA 137 137 136  K 4.3 4.2 3.9  CL 105 103 98  CO2 23 26 25   GLUCOSE 75 90 96  BUN 6* 6* 5*  CREATININE 0.91 0.90 0.88  CALCIUM 8.3* 9.1 8.4*   LFT Recent Labs    04/07/20 0424  PROT 6.6  ALBUMIN 3.0*  AST 20  ALT 40  ALKPHOS 104  BILITOT 0.6   Studies/Results: CT ABDOMEN PELVIS W CONTRAST  Result Date: 04/06/2020 CLINICAL DATA:  Worsening epigastric pain EXAM: CT ABDOMEN AND PELVIS WITH CONTRAST TECHNIQUE: Multidetector CT imaging of the abdomen and pelvis was performed using the standard protocol following bolus administration of intravenous contrast. CONTRAST:  120mL  OMNIPAQUE IOHEXOL 300 MG/ML  SOLN COMPARISON:  03/31/2020 CT, 04/01/2020 MRI FINDINGS: Lower chest: Mild bibasilar atelectatic changes seen. Hepatobiliary: Fatty infiltration of the liver is again noted. The gallbladder is again well distended. The degree of biliary dilatation has improved from the prior exam. Common bile duct appears slightly less prominent than that seen on the prior exam. Pancreas: Pancreas is again well visualized. The degree of inflammatory change surrounding the pancreatic head has improved somewhat in the interval from the prior MRI. The degree of fluid surrounding the duodenum has also improved. Spleen: Normal in size without focal abnormality. Adrenals/Urinary Tract: Adrenal glands are within normal limits. Kidneys demonstrate a normal enhancement pattern. Cortical thinning is noted on the left stable from the prior exam. This is likely related to renal artery stenosis on the left. The bladder is partially distended. Stomach/Bowel: Diverticular change of the colon is noted without evidence of diverticulitis. No obstructive changes are seen. The appendix has been surgically removed. The small bowel and stomach appear within normal limits. Vascular/Lymphatic: Atherosclerotic calcifications of the abdominal aorta are noted without aneurysmal dilatation. Reproductive: Status post hysterectomy. No adnexal masses. Other: No abdominal wall hernia or abnormality. No abdominopelvic ascites. Musculoskeletal: No acute or significant osseous findings. Stable right breast nodule is noted. IMPRESSION: The changes seen on recent MRI of pancreatitis have improved significantly in the interval from the prior exam. The  degree of biliary dilatation has improved as well. Diverticulosis without diverticulitis. Stable right breast nodule Electronically Signed   By: Inez Catalina M.D.   On: 04/06/2020 16:21    Assessment / Plan:   Assessment: 1.  Acute pancreatitis: Unclear etiology, elevated LFTs on  admission-no stones in gallbladder and CBD dilated, pain is somewhat better over the past 12 hours, now tolerating low-fat diet for the most part, continues with some nausea, has not required PCA in the past 12 hours, repeat CT yesterday showing improvement of pancreatitis 2.  Shingles rash  Plan: 1.  Discussed with the patient that goals would be to transition her to an oral pain medicine today.  We will leave this up to the hospitalist team.  If she is doing okay on oral pain medicine and oral antiemetics then she can be discharged tomorrow, which is what she is wanting. 2.  Continue low-fat diet today. 3.  Continue other supportive measures. 4.  Please await further recommendations from Dr. Rush Landmark later today.  Plan will hopefully be for discharge tomorrow with outpatient EUS scheduled for 3 weeks from now with Dr. Ardis Hughs.  Thank you for your kind consultation, we will continue to follow.   LOS: 6 days   [name]  04/07/2020, 8:33 AM

## 2020-04-08 ENCOUNTER — Telehealth: Payer: Self-pay

## 2020-04-08 LAB — URINE CULTURE: Culture: >=100000 — AB

## 2020-04-08 MED ORDER — FERROUS SULFATE 325 (65 FE) MG PO TABS: 325.0000 mg | ORAL_TABLET | Freq: Every day | ORAL | 0 refills | Status: DC

## 2020-04-08 MED ORDER — HYDROCODONE-ACETAMINOPHEN 10-325 MG PO TABS: 1.0000 | ORAL_TABLET | Freq: Four times a day (QID) | ORAL | 0 refills | Status: AC | PRN

## 2020-04-08 MED ORDER — CEPHALEXIN 500 MG PO CAPS: 1000.0000 mg | ORAL_CAPSULE | Freq: Two times a day (BID) | ORAL | 0 refills | Status: AC

## 2020-04-08 MED ORDER — PROMETHAZINE HCL 12.5 MG PO TABS: 12.5000 mg | ORAL_TABLET | Freq: Three times a day (TID) | ORAL | 0 refills | Status: DC | PRN

## 2020-04-08 MED ORDER — ADULT MULTIVITAMIN W/MINERALS CH: 1.0000 | ORAL_TABLET | Freq: Every day | ORAL | Status: DC

## 2020-04-08 MED ORDER — DIPHENHYDRAMINE-ZINC ACETATE 2-0.1 % EX CREA: TOPICAL_CREAM | Freq: Three times a day (TID) | CUTANEOUS | 0 refills | Status: DC | PRN

## 2020-04-08 MED ORDER — LISINOPRIL 5 MG PO TABS: 5.0000 mg | ORAL_TABLET | Freq: Every day | ORAL | 0 refills | Status: DC

## 2020-04-08 NOTE — TOC Transition Note (Signed)
Transition of Care [facility]) - CM/SW Discharge Note   Patient Details  Name: Robin Arellano MRN: [id number] Date of Birth: 12/05/1950  Transition of Care [facility]) CM/SW Contact:  Lennart Pall, LCSW Phone Number: 04/08/2020, 1:54 PM   Clinical Narrative:  Met with patient and spoke with MD/ PT about [place] follow up.  Orders in, however, pt does not feel she needs this at home and declining services at this time.  She understands that, if she changes her mind once home, she will need to contact her PCP to place orders for her.  No further needs.     Final next level of care: Home/Self Care Barriers to Discharge: No Barriers Identified   Patient Goals and CMS Choice Patient states their goals for this hospitalization and ongoing recovery are:: To get better, go home      Discharge Placement                       Discharge Plan and Services                                     Social Determinants of Health (SDOH) Interventions     Readmission Risk Interventions No flowsheet data found.

## 2020-04-08 NOTE — Telephone Encounter (Signed)
-----   Message from Milus Banister, MD sent at 04/07/2020  7:34 PM EDT ----- Regarding: RE: Follow-up Yes, my plan was to do further biliary testing after acute inflammation subsides.  I think EUS is best option.  Apolinar Bero, can you put her on for EUS in 4-5 weeks from now, [facility].  Thanks  ----- Message ----- From: Irving Copas., MD Sent: 04/07/2020   5:45 PM EDT To: Milus Banister, MD, Timothy Lasso, RN Subject: Follow-up                                      DJ,We repeated her CT scan while she was here because she was having persistent pain.Bile duct looks to be slightly improved but still remains dilated but no overt choledocholithiasis.Liver tests remain normal.Do you want to have her follow-up for an EUS with you to rule out periampullary lesion and microlithiasis or choledocholithiasis before a clinic visit already want to see her in clinic first.She likely will be discharged tomorrow or early this weekend.I think gallbladder physiology remains the more likely etiology but we certainly need to exclude ampullary lesions as well.I will let you and Rommie Dunn decide how you want to approach to follow-up in clinic versus direct EUS before she goes to potential surgery.  Thanks.GM

## 2020-04-08 NOTE — Plan of Care (Signed)
  Problem: Education: Goal: Knowledge of General Education information will improve Description: Including pain rating scale, medication(s)/side effects and non-pharmacologic comfort measures Outcome: Completed/Met   Problem: Health Behavior/Discharge Planning: Goal: Ability to manage health-related needs will improve Outcome: Completed/Met   Problem: Activity: Goal: Risk for activity intolerance will decrease Outcome: Completed/Met   Problem: Nutrition: Goal: Adequate nutrition will be maintained Outcome: Completed/Met   Problem: Coping: Goal: Level of anxiety will decrease Outcome: Completed/Met   Problem: Pain Managment: Goal: General experience of comfort will improve Outcome: Completed/Met   Problem: Safety: Goal: Ability to remain free from injury will improve Outcome: Completed/Met   Problem: Skin Integrity: Goal: Risk for impaired skin integrity will decrease Outcome: Completed/Met

## 2020-04-08 NOTE — Discharge Summary (Addendum)
Discharge Summary  Robin Arellano AA:[id number] DOB: 19-Dec-1950  PCP: Sandi Mariscal, MD  Admit date: 03/31/2020 Discharge date: 04/08/2020  Time spent: 40 mins  Recommendations for Outpatient Follow-up:  1. PCP in 1 week 2. GI as scheduled  Discharge Diagnoses:  Active Hospital Problems   Diagnosis Date Noted  . Acute pancreatitis 04/01/2020  . Common bile duct dilation   . Anxiety   . Hypokalemia   . Mild renal insufficiency   . Breast nodule   . Elevated troponin   . Elevated liver enzymes   . Abnormal magnetic resonance imaging of abdomen   . Coronary artery calcification of native artery     Resolved Hospital Problems  No resolved problems to display.    Discharge Condition: Stable  Diet recommendation: Low-fat diet  Vitals:   04/08/20 0635 04/08/20 1203  BP: (!) 177/84 (!) 156/70  Pulse: 76 65  Resp: 20 20  Temp: 98.3 F (36.8 C) 98.7 F (37.1 C)  SpO2: 94% 100%    History of present illness:  Patient is a 70 year old female with history of hypertension, anxiety, hyperlipidemia, fibromyalgia who presented to the emergency room with complaint of severe epigastric pain, nausea and vomiting. She describes the pain in the epigastric region accompanied by nausea and vomiting. No history of alcohol intake. No history of hepatobiliary disease. On presentation she was hypertensive hypokalemic, had mild AKI. Lipase was elevated to 5738. She had leukocytosis. CT abdomen/pelvis showed distended gallbladder with intra and extrahepatic biliary duct dilation, mild dilation of distal pancreatic duct, right breast nodule. Started on pain management, IV fluids. GI following. Underwent MRCPwith finding of dilated CBD.there is suspicion for choledocyst.GI consulted.    Today, patient seen and examined, reports feeling better, eager to be discharged.  Denies any worsening abdominal pain, denies any worsening nausea, no vomiting.  Denies any chest pain, shortness of breath,  fever/chills.  Patient tolerated her full liquid diet, advised to advance to soft diet, low-fat.  Patient stable to discharge also from a GI standpoint with close follow-up with them, as well as PCP in 1 week.   Hospital Course:  Principal Problem:   Acute pancreatitis Active Problems:   Coronary artery calcification of native artery   Anxiety   Hypokalemia   Mild renal insufficiency   Breast nodule   Elevated troponin   Elevated liver enzymes   Abnormal magnetic resonance imaging of abdomen   Common bile duct dilation   Acute pancreatitis, unclear etiology Presented with lipase greater than 5700 and epigastric pain.  CT showed CBD.  MRCP with no demonstrable stone Lipase level back down to normal Repeat CT shows improvement Follow-up with GI as scheduled PCP in 1 week with repeat labs Advance to soft diet as tolerated, advised patient to stay on a low-fat diet Pain meds and antiemetics upon discharge  UTI with E. Coli Complained of bilateral flank pain Afebrile, with no leukocytosis UA positive for pyuria on 03/31/20, UC grew E. Coli Unremarkable CT abd pelvis S/p 2 days of ceftriaxone, switched to p.o. Keflex to complete a total of 5 days  Essential hypertension BP is stable Continue Norvasc 10 mg daily, lisinopril 5 mg daily, Toprol-XL 25 mg daily  Chronic back pain/physical debility HH PT/OT Advised to mobilize as tolerated  Right breast nodule, incidental finding Outpatient mammogram recommended  Iron deficiency anemia/chronic macrocytic anemia Baseline hemoglobin appears to be 10 Anemia panel showed iron 31, sats 12%, folate 18, vitamin B12 277 No overt bleeding, suspect dilutional component Continue  Iron supplement 325 mg daily        Malnutrition Type:  Nutrition Problem: Increased nutrient needs Etiology: acute illness(pancreatitis)   Malnutrition Characteristics:  Signs/Symptoms: estimated needs   Nutrition  Interventions:  Interventions: Magic cup, MVI, Boost Breeze   Estimated body mass index is 31.01 kg/m as calculated from the following:   Height as of this encounter: 5\' 2"  (1.575 m).   Weight as of this encounter: 76.9 kg.    Procedures:  None  Consultations:  GI  Discharge Exam: BP (!) 156/70 (BP Location: Right Arm)   Pulse 65   Temp 98.7 F (37.1 C) (Oral)   Resp 20   Ht 5\' 2"  (1.575 m)   Wt 76.9 kg   LMP  (LMP Unknown)   SpO2 100%   BMI 31.01 kg/m   General: NAD Cardiovascular: S1, S2 present Respiratory: CTA B Abdomen: Soft, nontender, nondistended, bowel sounds present    Discharge Instructions You were cared for by a hospitalist during your hospital stay. If you have any questions about your discharge medications or the care you received while you were in the hospital after you are discharged, you can call the unit and asked to speak with the hospitalist on call if the hospitalist that took care of you is not available. Once you are discharged, your primary care physician will handle any further medical issues. Please note that NO REFILLS for any discharge medications will be authorized once you are discharged, as it is imperative that you return to your primary care physician (or establish a relationship with a primary care physician if you do not have one) for your aftercare needs so that they can reassess your need for medications and monitor your lab values.  Discharge Instructions    Diet - low sodium heart healthy   Complete by: As directed    Increase activity slowly   Complete by: As directed      Allergies as of 04/08/2020      Reactions   Prednisone Hives, Itching, Swelling   Latex Rash      Medication List    TAKE these medications   ALPRAZolam 0.5 MG tablet Commonly known as: XANAX Take 0.5 mg by mouth at bedtime.   amLODipine 10 MG tablet Commonly known as: NORVASC Take 1 tablet (10 mg total) by mouth daily.    amphetamine-dextroamphetamine 20 MG tablet Commonly known as: ADDERALL Take 20 mg by mouth 3 (three) times daily.   aspirin EC 81 MG tablet Take 81 mg by mouth daily.   butalbital-acetaminophen-caffeine 50-325-40 MG tablet Commonly known as: FIORICET Take 1 tablet by mouth every 6 (six) hours as needed for headache.   cephALEXin 500 MG capsule Commonly known as: KEFLEX Take 2 capsules (1,000 mg total) by mouth 2 (two) times daily for 3 days.   cholecalciferol 1000 units tablet Commonly known as: VITAMIN D Take 1,000 Units by mouth daily.   diphenhydrAMINE-zinc acetate cream Commonly known as: BENADRYL Apply topically 3 (three) times daily as needed for itching.   estradiol 0.5 MG tablet Commonly known as: ESTRACE Take 1/2 tablet daily and then stop when able   ezetimibe 10 MG tablet Commonly known as: ZETIA Take 1 tablet (10 mg total) by mouth daily.   ferrous sulfate 325 (65 FE) MG tablet Take 1 tablet (325 mg total) by mouth daily with breakfast. Start taking on: Apr 09, 2020   HYDROcodone-acetaminophen 10-325 MG tablet Commonly known as: NORCO Take 1 tablet by mouth every 6 (  six) hours as needed for up to 3 days for moderate pain. What changed:   how much to take  when to take this   ibuprofen 200 MG tablet Commonly known as: ADVIL Take 200 mg by mouth daily.   lisinopril 5 MG tablet Commonly known as: ZESTRIL Take 1 tablet (5 mg total) by mouth daily. Start taking on: Apr 09, 2020   metoprolol succinate 25 MG 24 hr tablet Commonly known as: TOPROL-XL Take 1 tablet (25 mg total) by mouth daily.   multivitamin with minerals Tabs tablet Take 1 tablet by mouth daily. Start taking on: Apr 09, 2020   promethazine 12.5 MG tablet Commonly known as: PHENERGAN Take 1 tablet (12.5 mg total) by mouth every 8 (eight) hours as needed for nausea or vomiting.   rosuvastatin 10 MG tablet Commonly known as: CRESTOR TAKE 1/2 TABLET BY MOUTH EVERY DAY    valACYclovir 500 MG tablet Commonly known as: VALTREX TAKE 1 TABLET BY MOUTH TWICE DAILY FOR 3 TO 5 DAYS THEN TAKE DAILY AS NEEDED   vitamin C 1000 MG tablet Take 1,000 mg by mouth daily.   zolpidem 10 MG tablet Commonly known as: AMBIEN Take 10 mg by mouth at bedtime.      Allergies  Allergen Reactions  . Prednisone Hives, Itching and Swelling  . Latex Rash   Follow-up Information    Sandi Mariscal, MD. Schedule an appointment as soon as possible for a visit.   Specialty: Internal Medicine Contact information: [place] [phone]        Troy Sine, MD .   Specialty: Cardiology Contact information: [address] [place] [address] [phone]            The results of significant diagnostics from this hospitalization (including imaging, microbiology, ancillary and laboratory) are listed below for reference.    Significant Diagnostic Studies: CT ABDOMEN PELVIS W CONTRAST  Result Date: 04/06/2020 CLINICAL DATA:  Worsening epigastric pain EXAM: CT ABDOMEN AND PELVIS WITH CONTRAST TECHNIQUE: Multidetector CT imaging of the abdomen and pelvis was performed using the standard protocol following bolus administration of intravenous contrast. CONTRAST:  153mL OMNIPAQUE IOHEXOL 300 MG/ML  SOLN COMPARISON:  03/31/2020 CT, 04/01/2020 MRI FINDINGS: Lower chest: Mild bibasilar atelectatic changes seen. Hepatobiliary: Fatty infiltration of the liver is again noted. The gallbladder is again well distended. The degree of biliary dilatation has improved from the prior exam. Common bile duct appears slightly less prominent than that seen on the prior exam. Pancreas: Pancreas is again well visualized. The degree of inflammatory change surrounding the pancreatic head has improved somewhat in the interval from the prior MRI. The degree of fluid surrounding the duodenum has also improved. Spleen: Normal in size without focal abnormality.  Adrenals/Urinary Tract: Adrenal glands are within normal limits. Kidneys demonstrate a normal enhancement pattern. Cortical thinning is noted on the left stable from the prior exam. This is likely related to renal artery stenosis on the left. The bladder is partially distended. Stomach/Bowel: Diverticular change of the colon is noted without evidence of diverticulitis. No obstructive changes are seen. The appendix has been surgically removed. The small bowel and stomach appear within normal limits. Vascular/Lymphatic: Atherosclerotic calcifications of the abdominal aorta are noted without aneurysmal dilatation. Reproductive: Status post hysterectomy. No adnexal masses. Other: No abdominal wall hernia or abnormality. No abdominopelvic ascites. Musculoskeletal: No acute or significant osseous findings. Stable right breast nodule is noted. IMPRESSION: The changes seen on recent MRI of  pancreatitis have improved significantly in the interval from the prior exam. The degree of biliary dilatation has improved as well. Diverticulosis without diverticulitis. Stable right breast nodule Electronically Signed   By: Inez Catalina M.D.   On: 04/06/2020 16:21   CT ABDOMEN PELVIS W CONTRAST  Result Date: 04/01/2020 CLINICAL DATA:  Pancreatitis. Elevated lipase. EXAM: CT ABDOMEN AND PELVIS WITH CONTRAST TECHNIQUE: Multidetector CT imaging of the abdomen and pelvis was performed using the standard protocol following bolus administration of intravenous contrast. CONTRAST:  41mL OMNIPAQUE IOHEXOL 300 MG/ML  SOLN COMPARISON:  02/17/2018 FINDINGS: Lower chest: Lungs are clear. Heart size is mildly enlarged. There is a right-sided breast nodule measuring approximately 1.8 cm (increased in size from prior study in 2019). Hepatobiliary: The liver is normal. The gallbladder is distended.There is intrahepatic and extrahepatic biliary ductal dilatation. Pancreas: Pancreatic duct is mildly dilated distally. There are no significant  peripancreatic inflammatory changes. Spleen: Unremarkable. Adrenals/Urinary Tract: --Adrenal glands: Unremarkable. --Right kidney/ureter: No hydronephrosis or radiopaque kidney stones. --Left kidney/ureter: There is atrophy of the upper pole the left kidney. There is no hydronephrosis. --Urinary bladder: Unremarkable. Stomach/Bowel: --Stomach/Duodenum: No hiatal hernia or other gastric abnormality. Normal duodenal course and caliber. --Small bowel: Unremarkable. --Colon: Rectosigmoid diverticulosis without acute inflammation. --Appendix: Normal. Vascular/Lymphatic: Atherosclerotic calcification is present within the non-aneurysmal abdominal aorta, without hemodynamically significant stenosis. --No retroperitoneal lymphadenopathy. --No mesenteric lymphadenopathy. --No pelvic or inguinal lymphadenopathy. Reproductive: Status post hysterectomy. No adnexal mass. Other: No ascites or free air. The abdominal wall is normal. Musculoskeletal. No acute displaced fractures. IMPRESSION: 1. Distended gallbladder with intrahepatic and extrahepatic biliary ductal dilatation. Pancreatic duct is mildly dilated distally. No significant peripancreatic inflammatory changes. Correlation with laboratory studies is recommended. Follow-up with MRCP/ERCP is recommended. 2. Rectosigmoid diverticulosis without acute inflammation. 3. Right-sided breast nodule measuring approximately 1.8 cm (increased in size from prior study in 2019). Correlation with outpatient mammography is recommended. Aortic Atherosclerosis (ICD10-I70.0). Electronically Signed   By: Constance Holster M.D.   On: 04/01/2020 00:12   MR 3D Recon At Scanner  Result Date: 04/01/2020 CLINICAL DATA:  Evaluate for pancreatitis. EXAM: MRI ABDOMEN WITHOUT AND WITH CONTRAST (INCLUDING MRCP) TECHNIQUE: Multiplanar multisequence MR imaging of the abdomen was performed both before and after the administration of intravenous contrast. Heavily T2-weighted images of the biliary and  pancreatic ducts were obtained, and three-dimensional MRCP images were rendered by post processing. CONTRAST:  31mL GADAVIST GADOBUTROL 1 MMOL/ML IV SOLN COMPARISON:  03/31/2020 FINDINGS: Lower chest: No acute findings. Hepatobiliary: No suspicious liver lesions. Distended gallbladder with mild gallbladder wall thickening. No gallstones. Mild intrahepatic biliary ductal dilatation. Fusiform dilatation of the CBD is noted which has a maximum diameter of 1.5 cm. No choledocholithiasis identified Pancreas: Mild diffuse edema of the pancreas noted. Peripancreatic fluid surrounds the head of pancreas and duodenum. No mass identified. No main duct dilatation. No findings of pancreatic necrosis or pseudocyst formation. Spleen:  Within normal limits in size and appearance. Adrenals/Urinary Tract: No masses identified. No evidence of hydronephrosis. There is mild asymmetric left kidney atrophy. Stomach/Bowel: Visualized portions within the abdomen are unremarkable. Vascular/Lymphatic: Extensive aortic atherosclerosis. No aneurysm. The portal vein and splenic vein and portal venous confluence remain patent. Other:  None. Musculoskeletal: No suspicious bone lesions identified. IMPRESSION: 1. Mild diffuse edema of the pancreas compatible with acute pancreatitis. No findings of pancreatic necrosis or pseudocyst formation. 2. Fusiform dilatation of the CBD which has a maximum diameter of 1.5 cm. No choledocholithiasis identified. 3. Distended gallbladder with mild gallbladder wall  thickening. No gallstones. Electronically Signed   By: Kerby Moors M.D.   On: 04/01/2020 09:23   MR ABDOMEN MRCP W WO CONTAST  Result Date: 04/01/2020 CLINICAL DATA:  Evaluate for pancreatitis. EXAM: MRI ABDOMEN WITHOUT AND WITH CONTRAST (INCLUDING MRCP) TECHNIQUE: Multiplanar multisequence MR imaging of the abdomen was performed both before and after the administration of intravenous contrast. Heavily T2-weighted images of the biliary and  pancreatic ducts were obtained, and three-dimensional MRCP images were rendered by post processing. CONTRAST:  62mL GADAVIST GADOBUTROL 1 MMOL/ML IV SOLN COMPARISON:  03/31/2020 FINDINGS: Lower chest: No acute findings. Hepatobiliary: No suspicious liver lesions. Distended gallbladder with mild gallbladder wall thickening. No gallstones. Mild intrahepatic biliary ductal dilatation. Fusiform dilatation of the CBD is noted which has a maximum diameter of 1.5 cm. No choledocholithiasis identified Pancreas: Mild diffuse edema of the pancreas noted. Peripancreatic fluid surrounds the head of pancreas and duodenum. No mass identified. No main duct dilatation. No findings of pancreatic necrosis or pseudocyst formation. Spleen:  Within normal limits in size and appearance. Adrenals/Urinary Tract: No masses identified. No evidence of hydronephrosis. There is mild asymmetric left kidney atrophy. Stomach/Bowel: Visualized portions within the abdomen are unremarkable. Vascular/Lymphatic: Extensive aortic atherosclerosis. No aneurysm. The portal vein and splenic vein and portal venous confluence remain patent. Other:  None. Musculoskeletal: No suspicious bone lesions identified. IMPRESSION: 1. Mild diffuse edema of the pancreas compatible with acute pancreatitis. No findings of pancreatic necrosis or pseudocyst formation. 2. Fusiform dilatation of the CBD which has a maximum diameter of 1.5 cm. No choledocholithiasis identified. 3. Distended gallbladder with mild gallbladder wall thickening. No gallstones. Electronically Signed   By: Kerby Moors M.D.   On: 04/01/2020 09:23    Microbiology: Recent Results (from the past 240 hour(s))  SARS Coronavirus 2 by RT PCR (hospital order, performed in [facility] hospital lab) Nasopharyngeal Nasopharyngeal Swab     Status: None   Collection Time: 04/01/20 12:46 AM   Specimen: Nasopharyngeal Swab  Result Value Ref Range Status   SARS Coronavirus 2 NEGATIVE NEGATIVE Final     Comment: (NOTE) SARS-CoV-2 target nucleic acids are NOT DETECTED. The SARS-CoV-2 RNA is generally detectable in upper and lower respiratory specimens during the acute phase of infection. The lowest concentration of SARS-CoV-2 viral copies this assay can detect is 250 copies / mL. A negative result does not preclude SARS-CoV-2 infection and should not be used as the sole basis for treatment or other patient management decisions.  A negative result may occur with improper specimen collection / handling, submission of specimen other than nasopharyngeal swab, presence of viral mutation(s) within the areas targeted by this assay, and inadequate number of viral copies (<250 copies / mL). A negative result must be combined with clinical observations, patient history, and epidemiological information. Fact Sheet for Patients:   StrictlyIdeas.no Fact Sheet for Healthcare Providers: BankingDealers.co.za This test is not yet approved or cleared  by the [place] FDA and has been authorized for detection and/or diagnosis of SARS-CoV-2 by FDA under an Emergency Use Authorization (EUA).  This EUA will remain in effect (meaning this test can be used) for the duration of the COVID-19 declaration under Section 564(b)(1) of the Act, 21 U.S.C. section 360bbb-3(b)(1), unless the authorization is terminated or revoked sooner. Performed at [facility], [place] [address] [place], [place] [address]   Culture, Urine     Status: Abnormal   Collection Time: 04/05/20  6:17 PM   Specimen: Urine, Random  Result Value  Ref Range Status   Specimen Description   Final    URINE, RANDOM Performed at [facility] [address] [place], [place] [address]    Special Requests   Final    NONE Performed at [facility], [place] [address], [place], [place] [address]    Culture >=100,000 COLONIES/mL ESCHERICHIA COLI  (A)  Final   Report Status 04/08/2020 FINAL  Final   Organism ID, Bacteria ESCHERICHIA COLI (A)  Final      Susceptibility   Escherichia coli - MIC*    AMPICILLIN 4 SENSITIVE Sensitive     CEFAZOLIN <=4 SENSITIVE Sensitive     CEFTRIAXONE <=1 SENSITIVE Sensitive     CIPROFLOXACIN <=0.25 SENSITIVE Sensitive     GENTAMICIN <=1 SENSITIVE Sensitive     IMIPENEM <=0.25 SENSITIVE Sensitive     NITROFURANTOIN <=16 SENSITIVE Sensitive     TRIMETH/SULFA <=20 SENSITIVE Sensitive     AMPICILLIN/SULBACTAM <=2 SENSITIVE Sensitive     PIP/TAZO <=4 SENSITIVE Sensitive     * >=100,000 COLONIES/mL ESCHERICHIA COLI     Labs: Basic Metabolic Panel: Recent Labs  Lab 04/03/20 0327 04/04/20 0357 04/05/20 0621 04/06/20 0350 04/07/20 0424  NA 140 138 137 137 136  K 2.8* 3.4* 4.3 4.2 3.9  CL 107 108 105 103 98  CO2 23 22 23 26 25   GLUCOSE 88 74 75 90 96  BUN 8 8 6* 6* 5*  CREATININE 0.84 0.79 0.91 0.90 0.88  CALCIUM 8.5* 8.2* 8.3* 9.1 8.4*  MG  --  1.7  --   --   --   PHOS  --  3.8  --   --   --    Liver Function Tests: Recent Labs  Lab 04/03/20 0327 04/04/20 0357 04/05/20 0621 04/06/20 0350 04/07/20 0424  AST 59* 30 23 21 20   ALT 125* 80* 61* 50* 40  ALKPHOS 124 103 107 109 104  BILITOT 0.8 0.4 0.5 0.2* 0.6  PROT 6.2* 5.8* 6.2* 6.7 6.6  ALBUMIN 3.0* 2.9* 2.9* 3.3* 3.0*   Recent Labs  Lab 04/02/20 0425 04/04/20 0357 04/05/20 0621 04/06/20 0350  LIPASE 203* 39 36 35   No results for input(s): AMMONIA in the last 168 hours. CBC: Recent Labs  Lab 04/02/20 0425 04/02/20 0425 04/03/20 0327 04/04/20 0357 04/05/20 0621 04/06/20 0350 04/07/20 0424  WBC 16.0*   < > 11.5* 9.2 8.9 8.4 7.5  NEUTROABS 12.9*  --  7.7 5.8  --   --   --   HGB 10.6*   < > 9.3* 8.7* 9.2* 9.3* 8.9*  HCT 32.2*   < > 28.9* 27.3* 28.9* 29.4* 28.1*  MCV 97.3   < > 98.6 101.1* 103.2* 101.0* 101.1*  PLT 251   < > 219 233 233 249 244   < > = values in this interval not displayed.   Cardiac Enzymes: No  results for input(s): CKTOTAL, CKMB, CKMBINDEX, TROPONINI in the last 168 hours. BNP: BNP (last 3 results) No results for input(s): BNP in the last 8760 hours.  ProBNP (last 3 results) No results for input(s): PROBNP in the last 8760 hours.  CBG: No results for input(s): GLUCAP in the last 168 hours.     Signed:  Alma Friendly, MD [facility] 04/08/2020, 12:20 PM

## 2020-04-08 NOTE — Progress Notes (Signed)
Patient refused heparin SQ and Capsicum. Patient states she think she is allergic to med and prefers not to take at this time. Patient was provided education on medication admin. Compton Brigance, Laurel Dimmer, RN

## 2020-04-08 NOTE — Progress Notes (Signed)
Progress Note   Subjective  Day #8  Chief Complaint: Acute pancreatitis  Today, the patient tells me that she continues to have some epigastric pain, her nausea is some better.  Her largest complaints of anxiety as she remains in the hospital as well as back pain related to this rash which has developed.  She is still willing to go home and has been able to tolerate most of her diet.  Tells me she did okay with the oral pain meds and antiemetics.   Objective   Vital signs in last 24 hours: Temp:  [98.3 F (36.8 C)-99.7 F (37.6 C)] 98.3 F (36.8 C) (05/28 0635) Pulse Rate:  [63-76] 76 (05/28 0635) Resp:  [16-20] 20 (05/28 0635) BP: (157-177)/(62-84) 177/84 (05/28 0635) SpO2:  [91 %-94 %] 94 % (05/28 0635) Last BM Date: 04/07/20 General:    white female in NAD Heart:  Regular rate and rhythm; no murmurs Lungs: Respirations even and unlabored, lungs CTA bilaterally Abdomen:  Soft, moderate epigastric ttp and nondistended. Normal bowel sounds. Extremities:  Without edema. Neurologic:  Alert and oriented,  grossly normal neurologically. Psych:  Cooperative. Normal mood and affect.  Intake/Output from previous day: 05/27 0701 - 05/28 0700 In: 460 [P.O.:360; IV Piggyback:100] Out: 1300 [Urine:1300]  Lab Results: Recent Labs    04/06/20 0350 04/07/20 0424  WBC 8.4 7.5  HGB 9.3* 8.9*  HCT 29.4* 28.1*  PLT 249 244   BMET Recent Labs    04/06/20 0350 04/07/20 0424  NA 137 136  K 4.2 3.9  CL 103 98  CO2 26 25  GLUCOSE 90 96  BUN 6* 5*  CREATININE 0.90 0.88  CALCIUM 9.1 8.4*   LFT Recent Labs    04/07/20 0424  PROT 6.6  ALBUMIN 3.0*  AST 20  ALT 40  ALKPHOS 104  BILITOT 0.6   Studies/Results: CT ABDOMEN PELVIS W CONTRAST  Result Date: 04/06/2020 CLINICAL DATA:  Worsening epigastric pain EXAM: CT ABDOMEN AND PELVIS WITH CONTRAST TECHNIQUE: Multidetector CT imaging of the abdomen and pelvis was performed using the standard protocol following bolus  administration of intravenous contrast. CONTRAST:  151mL OMNIPAQUE IOHEXOL 300 MG/ML  SOLN COMPARISON:  03/31/2020 CT, 04/01/2020 MRI FINDINGS: Lower chest: Mild bibasilar atelectatic changes seen. Hepatobiliary: Fatty infiltration of the liver is again noted. The gallbladder is again well distended. The degree of biliary dilatation has improved from the prior exam. Common bile duct appears slightly less prominent than that seen on the prior exam. Pancreas: Pancreas is again well visualized. The degree of inflammatory change surrounding the pancreatic head has improved somewhat in the interval from the prior MRI. The degree of fluid surrounding the duodenum has also improved. Spleen: Normal in size without focal abnormality. Adrenals/Urinary Tract: Adrenal glands are within normal limits. Kidneys demonstrate a normal enhancement pattern. Cortical thinning is noted on the left stable from the prior exam. This is likely related to renal artery stenosis on the left. The bladder is partially distended. Stomach/Bowel: Diverticular change of the colon is noted without evidence of diverticulitis. No obstructive changes are seen. The appendix has been surgically removed. The small bowel and stomach appear within normal limits. Vascular/Lymphatic: Atherosclerotic calcifications of the abdominal aorta are noted without aneurysmal dilatation. Reproductive: Status post hysterectomy. No adnexal masses. Other: No abdominal wall hernia or abnormality. No abdominopelvic ascites. Musculoskeletal: No acute or significant osseous findings. Stable right breast nodule is noted. IMPRESSION: The changes seen on recent MRI of pancreatitis have improved significantly in  the interval from the prior exam. The degree of biliary dilatation has improved as well. Diverticulosis without diverticulitis. Stable right breast nodule Electronically Signed   By: Inez Catalina M.D.   On: 04/06/2020 16:21    Assessment / Plan:   Assessment: 1.  Acute  pancreatitis: Unclear etiology, elevated LFTs on admission-no stones in the gallbladder and CBD dilated, pain is somewhat better over the past 12 hours, now tolerating low-fat diet for the most part, continues with some nausea, doing okay with oral pain meds and antiemetics overnight, patient is wanting to go home  Plan: 1.  Okay with patient discharge home today on oral pain medicine and antiemetics.  Did discuss with her that she should schedule out her oral pain meds over the next few days until things begin to settle down.  I believe that some of her stress and anxiety as well as pain is related to being in the hospital in the hospital bed.  She also expresses that she has "white coat syndrome". 2.  Continue low-fat diet 3.  Continue other supportive measures while in the hospital. 4.  We are scheduling the patient for an outpatient EUS in the next 3 to 4 weeks for further evaluation with Dr. Ardis Hughs.  She will be notified of this appointment. 5.  Discussed with patient to call our clinic if she has any problems before then. 6.  Please await any final recommendations from Dr. Rush Landmark.  Thank you for kind consultation.  We will sign off.    LOS: 7 days   [name]  04/08/2020, 10:40 AM

## 2020-04-08 NOTE — Progress Notes (Signed)
PT Cancellation Note  Patient Details Name: Robin Arellano MRN: [id number] DOB: 1951/10/02   Cancelled Treatment:    Reason Eval/Treat Not Completed: Patient declined, no reason specified. Pt just finished working with OT and reports she wants to rest. Pt reports her sister is coming to help her wash her hair and she is being discharged home; doesn't feel like she needs physical therapy prior to discharge. Will attempt tomorrow if still inpatient.    Talbot Grumbling PT, DPT 04/08/20, 11:42 AM

## 2020-04-08 NOTE — Progress Notes (Signed)
Occupational Therapy Treatment Patient Details Name: Robin Arellano MRN: [id number] DOB: May 09, 1951 Today's Date: 04/08/2020    History of present illness Robin Arellano is a 69 y.o. female with medical history significant for hypertension, anxiety, and fibromyalgia, now presenting to the emergency department with severe epigastric pain, nausea, and nonbloody vomiting.  Patient reports that she had been in her usual state of health until 03/27/2020 when she developed severe pain localized to the epigastrium.  This was accompanied by nausea and nonbloody vomiting.  Symptoms have been constant, worse shortly after attempting to eat or drink, and with no alleviating factors identified.   OT comments  Patient returned to baseline functioning. Patient denies any questions/concerns for OT at this time, is ready to be home. Will discontinue acute OT services at this time.  Follow Up Recommendations  No OT follow up    Equipment Recommendations  None recommended by OT       Precautions / Restrictions Precautions Precautions: Other (comment) Precaution Comments: shingles Restrictions Weight Bearing Restrictions: No       Mobility Bed Mobility Overal bed mobility: Modified Independent                Transfers Overall transfer level: Modified independent               General transfer comment: no physical assist needed     Balance Overall balance assessment: Mild deficits observed, not formally tested                                         ADL either performed or assessed with clinical judgement   ADL Overall ADL's : At baseline                                       General ADL Comments: patient demonstrates functional transfers and self care at baseline today, no loss of balance or physical assist needed               Cognition Arousal/Alertness: Awake/alert Behavior During Therapy: WFL for tasks assessed/performed Overall  Cognitive Status: Within Functional Limits for tasks assessed                                                     Pertinent Vitals/ Pain       Pain Assessment: Faces Faces Pain Scale: Hurts little more Pain Location: back Pain Descriptors / Indicators: Discomfort Pain Intervention(s): Premedicated before session   Progress Toward Goals  OT Goals(current goals can now be found in the care plan section)  Progress towards OT goals: Goals met/education completed, patient discharged from OT  Acute Rehab OT Goals Patient Stated Goal: To get out of the hospital OT Goal Formulation: With patient Time For Goal Achievement: 04/08/20 ADL Goals Pt Will Perform Grooming: Independently Pt Will Perform Upper Body Dressing: Independently Pt Will Perform Lower Body Dressing: Independently Pt Will Transfer to Toilet: Independently Pt Will Perform Toileting - Clothing Manipulation and hygiene: Independently  Plan All goals met and education completed, patient discharged from OT services       AM-PAC OT "6 Clicks" Daily Activity     Outcome Measure  Help from another person eating meals?: None Help from another person taking care of personal grooming?: None Help from another person toileting, which includes using toliet, bedpan, or urinal?: None Help from another person bathing (including washing, rinsing, drying)?: None Help from another person to put on and taking off regular upper body clothing?: None Help from another person to put on and taking off regular lower body clothing?: None 6 Click Score: 24    End of Session  OT Visit Diagnosis: Unsteadiness on feet (R26.81);Pain Pain - part of body: (back)   Activity Tolerance Patient tolerated treatment well   Patient Left in bed;with call bell/phone within reach           Time: 1130-1140 OT Time Calculation (min): 10 min  Charges: OT General Charges $OT Visit: 1 Visit OT Treatments $Self Care/Home  Management : 8-22 mins  Delbert Phenix OT Pager: [place] 04/08/2020, 11:47 AM

## 2020-04-08 NOTE — Progress Notes (Signed)
Patient refused heparin SQ and Capsicum. Patient states she think she is allergic to med and refused to take. Patient was provided education on medication admin. Will make day shift nurse aware.

## 2020-04-12 ENCOUNTER — Other Ambulatory Visit: Payer: Self-pay

## 2020-04-12 DIAGNOSIS — K838 Other specified diseases of biliary tract: Secondary | ICD-10-CM

## 2020-04-12 NOTE — Telephone Encounter (Signed)
EUS scheduled, pt instructed and medications reviewed.  Patient instructions mailed to home.  Patient to call with any questions or concerns. COVID instructions also advised and verbally understood

## 2020-04-13 ENCOUNTER — Other Ambulatory Visit: Payer: Self-pay | Admitting: Gastroenterology

## 2020-04-13 NOTE — Telephone Encounter (Signed)
Sorry but she will need to contact her PCP or [facility] hospitalist about this.  I saw her in consultation for acute pancreatitis.

## 2020-04-13 NOTE — Telephone Encounter (Signed)
Left message for patient to return call to further discuss medications.

## 2020-04-13 NOTE — Telephone Encounter (Signed)
Patient advised to contact PCP for refills on cephalexin.  Patient agreed to plan and verbalized understanding. No further questions.

## 2020-04-13 NOTE — Telephone Encounter (Signed)
Patient would like to know if you would refill cephalexin 500mg  take 2 capsules twice daily for 3 days as she is still having complications from UTI.  Patient states she does not have energy or strength to go to PCP's office for visit to be seen for them to refill.  Please advise.

## 2020-04-20 ENCOUNTER — Other Ambulatory Visit (INDEPENDENT_AMBULATORY_CARE_PROVIDER_SITE_OTHER): Payer: PPO

## 2020-04-20 ENCOUNTER — Telehealth: Payer: Self-pay | Admitting: Gastroenterology

## 2020-04-20 DIAGNOSIS — K859 Acute pancreatitis without necrosis or infection, unspecified: Secondary | ICD-10-CM | POA: Diagnosis not present

## 2020-04-20 LAB — CBC WITH DIFFERENTIAL/PLATELET
Basophils Absolute: 0.1 10*3/uL (ref 0.0–0.1)
Basophils Relative: 0.5 % (ref 0.0–3.0)
Eosinophils Absolute: 0.2 10*3/uL (ref 0.0–0.7)
Eosinophils Relative: 2 % (ref 0.0–5.0)
HCT: 35 % — ABNORMAL LOW (ref 36.0–46.0)
Hemoglobin: 11.6 g/dL — ABNORMAL LOW (ref 12.0–15.0)
Lymphocytes Relative: 23.2 % (ref 12.0–46.0)
Lymphs Abs: 2.7 10*3/uL (ref 0.7–4.0)
MCHC: 33.2 g/dL (ref 30.0–36.0)
MCV: 96.7 fl (ref 78.0–100.0)
Monocytes Absolute: 0.8 10*3/uL (ref 0.1–1.0)
Monocytes Relative: 6.9 % (ref 3.0–12.0)
Neutro Abs: 7.9 10*3/uL — ABNORMAL HIGH (ref 1.4–7.7)
Neutrophils Relative %: 67.4 % (ref 43.0–77.0)
Platelets: 465 10*3/uL — ABNORMAL HIGH (ref 150.0–400.0)
RBC: 3.62 Mil/uL — ABNORMAL LOW (ref 3.87–5.11)
RDW: 14.1 % (ref 11.5–15.5)
WBC: 11.8 10*3/uL — ABNORMAL HIGH (ref 4.0–10.5)

## 2020-04-20 LAB — COMPREHENSIVE METABOLIC PANEL
ALT: 11 U/L (ref 0–35)
AST: 16 U/L (ref 0–37)
Albumin: 4 g/dL (ref 3.5–5.2)
Alkaline Phosphatase: 99 U/L (ref 39–117)
BUN: 9 mg/dL (ref 6–23)
CO2: 26 mEq/L (ref 19–32)
Calcium: 9.5 mg/dL (ref 8.4–10.5)
Chloride: 100 mEq/L (ref 96–112)
Creatinine, Ser: 1.15 mg/dL (ref 0.40–1.20)
GFR: 46.73 mL/min — ABNORMAL LOW (ref >60.00)
Glucose, Bld: 106 mg/dL — ABNORMAL HIGH (ref 70–99)
Potassium: 4.5 mEq/L (ref 3.5–5.1)
Sodium: 132 mEq/L — ABNORMAL LOW (ref 135–145)
Total Bilirubin: 0.3 mg/dL (ref 0.2–1.2)
Total Protein: 7.6 g/dL (ref 6.0–8.3)

## 2020-04-20 LAB — LIPASE: Lipase: 43 U/L (ref 11.0–59.0)

## 2020-04-20 LAB — AMYLASE: Amylase: 46 U/L (ref 27–131)

## 2020-04-20 NOTE — Telephone Encounter (Signed)
Spoke with patient, pt recently in hospital for pancreatitis. Pt is reporting left sided pain under her ribcage, pt describes as a continuous sharp pain, pt states that pain has been fluctuating but last night was the worst pain.Pt states that pain is now radiating to the right side, pt states that she has pain on the right side near her ribcage after she releases pressure (rebound tenderness), pt states that she has been sticking to her soft diet, pt states that she had to take 1/2 tablet of Hydrocodone. Pt is currently lying down with ice on her abdomen to get relief. Please advise, thank you.

## 2020-04-20 NOTE — Telephone Encounter (Signed)
If pain is severe, she needs to go to the ER for evaluation. If she thinks she can manage as an outpatient then labs today (cbc, cmet, amylase and lipase) and push fluids.

## 2020-04-20 NOTE — Telephone Encounter (Signed)
Spoke with patient, pt rated pain a 4/10, pt advised to have labs drawn today and push fluids. Pt states that she had been drinking a lot of water, encouraged pt to try some gatorade/ gatorade zero to help replenish electrolytes. Order in epic for lab work today.

## 2020-04-20 NOTE — Telephone Encounter (Signed)
Pt reported that she is experiencing pain under her left right rib.  Please advise.

## 2020-05-09 ENCOUNTER — Other Ambulatory Visit (HOSPITAL_COMMUNITY)
Admission: RE | Admit: 2020-05-09 | Discharge: 2020-05-09 | Disposition: A | Discharge status: Home / Self Care | Payer: PPO | Source: Ambulatory Visit | Attending: Gastroenterology | Admitting: Gastroenterology

## 2020-05-09 DIAGNOSIS — Z20822 Contact with and (suspected) exposure to covid-19: Secondary | ICD-10-CM | POA: Insufficient documentation

## 2020-05-09 DIAGNOSIS — Z01812 Encounter for preprocedural laboratory examination: Secondary | ICD-10-CM | POA: Diagnosis not present

## 2020-05-09 LAB — SARS CORONAVIRUS 2 (TAT 6-24 HRS): SARS Coronavirus 2: NEGATIVE

## 2020-05-11 ENCOUNTER — Encounter (HOSPITAL_COMMUNITY): Payer: Self-pay | Admitting: Gastroenterology

## 2020-05-11 ENCOUNTER — Telehealth: Payer: Self-pay | Admitting: Gastroenterology

## 2020-05-11 MED ORDER — ONDANSETRON HCL 4 MG PO TABS
4.0000 mg | ORAL_TABLET | Freq: Two times a day (BID) | ORAL | 3 refills | Status: DC
Start: 2020-05-11 — End: 2020-07-21

## 2020-05-11 NOTE — Anesthesia Preprocedure Evaluation (Addendum)
Anesthesia Evaluation  Patient identified by MRN, date of birth, ID band Patient awake    Reviewed: Allergy & Precautions, NPO status , Patient's Chart, lab work & pertinent test results, reviewed documented beta blocker date and time   History of Anesthesia Complications (+) PONV and history of anesthetic complications  Airway Mallampati: II  TM Distance: >3 FB Neck ROM: Full    Dental no notable dental hx. (+) Teeth Intact   Pulmonary former smoker,    Pulmonary exam normal breath sounds clear to auscultation       Cardiovascular hypertension, Pt. on medications + CAD  (-) Past MI Normal cardiovascular exam Rhythm:Regular Rate:Normal     Neuro/Psych  Headaches, PSYCHIATRIC DISORDERS Anxiety Depression  Neuromuscular disease    GI/Hepatic Elevated LFT's recently Dilated CBD Hx/o pancreatitis   Endo/Other  Hyperlipidemia  Renal/GU Renal InsufficiencyRenal disease  negative genitourinary   Musculoskeletal  (+) Arthritis , Osteoarthritis,  Fibromyalgia -  Abdominal   Peds  Hematology  (+) anemia ,   Anesthesia Other Findings   Reproductive/Obstetrics                            Anesthesia Physical Anesthesia Plan  ASA: II  Anesthesia Plan: MAC   Post-op Pain Management:    Induction:   PONV Risk Score and Plan: 3 and Ondansetron, Treatment may vary due to age or medical condition and Propofol infusion  Airway Management Planned: Natural Airway and Nasal Cannula  Additional Equipment:   Intra-op Plan:   Post-operative Plan:   Informed Consent: I have reviewed the patients History and Physical, chart, labs and discussed the procedure including the risks, benefits and alternatives for the proposed anesthesia with the patient or authorized representative who has indicated his/her understanding and acceptance.     Dental advisory given  Plan Discussed with: CRNA  Anesthesia  Plan Comments:        Anesthesia Quick Evaluation

## 2020-05-11 NOTE — Telephone Encounter (Signed)
The pt has been advised and prescription sent to the pharmacy.  She will keep appt for tomorrow as planned

## 2020-05-11 NOTE — Telephone Encounter (Signed)
Certainly. Zofran 4mg  pills, one pill po BID PRN. Disp 30, 3 refills.  Thanks

## 2020-05-11 NOTE — Telephone Encounter (Signed)
Dr Ardis Hughs the pt has an EUS tomorrow and states she has some nausea and would like to know if you can prescribe enough zofran for her to take just to get thru tomorrow. Please advise

## 2020-05-11 NOTE — Telephone Encounter (Signed)
Patient called states she has procedure tomorrow and she has been really nauseated since her medication finished on Monday (Blood Thinner) not sure if that's what's causing it. Please advise.

## 2020-05-12 ENCOUNTER — Other Ambulatory Visit: Payer: Self-pay

## 2020-05-12 ENCOUNTER — Ambulatory Visit (HOSPITAL_COMMUNITY)
Admission: RE | Admit: 2020-05-12 | Discharge: 2020-05-12 | Disposition: A | Discharge status: Home / Self Care | Payer: PPO | Attending: Gastroenterology | Admitting: Gastroenterology

## 2020-05-12 ENCOUNTER — Encounter (HOSPITAL_COMMUNITY)
Admission: RE | Disposition: A | Discharge status: Home / Self Care | Payer: Self-pay | Source: Home / Self Care | Attending: Gastroenterology

## 2020-05-12 ENCOUNTER — Ambulatory Visit (HOSPITAL_COMMUNITY): Payer: PPO | Admitting: Anesthesiology

## 2020-05-12 ENCOUNTER — Encounter (HOSPITAL_COMMUNITY): Payer: Self-pay | Admitting: Gastroenterology

## 2020-05-12 ENCOUNTER — Telehealth: Payer: Self-pay

## 2020-05-12 DIAGNOSIS — Z8719 Personal history of other diseases of the digestive system: Secondary | ICD-10-CM | POA: Diagnosis not present

## 2020-05-12 DIAGNOSIS — Z79899 Other long term (current) drug therapy: Secondary | ICD-10-CM | POA: Insufficient documentation

## 2020-05-12 DIAGNOSIS — I10 Essential (primary) hypertension: Secondary | ICD-10-CM | POA: Diagnosis not present

## 2020-05-12 DIAGNOSIS — K838 Other specified diseases of biliary tract: Secondary | ICD-10-CM | POA: Insufficient documentation

## 2020-05-12 DIAGNOSIS — I252 Old myocardial infarction: Secondary | ICD-10-CM | POA: Insufficient documentation

## 2020-05-12 DIAGNOSIS — I2510 Atherosclerotic heart disease of native coronary artery without angina pectoris: Secondary | ICD-10-CM | POA: Diagnosis not present

## 2020-05-12 DIAGNOSIS — Z87891 Personal history of nicotine dependence: Secondary | ICD-10-CM | POA: Diagnosis not present

## 2020-05-12 DIAGNOSIS — K2970 Gastritis, unspecified, without bleeding: Secondary | ICD-10-CM | POA: Diagnosis not present

## 2020-05-12 HISTORY — PX: BIOPSY: SHX5522

## 2020-05-12 HISTORY — PX: ESOPHAGOGASTRODUODENOSCOPY (EGD) WITH PROPOFOL: SHX5813

## 2020-05-12 HISTORY — PX: EUS: SHX5427

## 2020-05-12 SURGERY — UPPER ENDOSCOPIC ULTRASOUND (EUS) RADIAL
Anesthesia: Monitor Anesthesia Care

## 2020-05-12 MED ORDER — PROPOFOL 500 MG/50ML IV EMUL
INTRAVENOUS | Status: DC | PRN
  Administered 2020-05-12: 150 ug/kg/min via INTRAVENOUS

## 2020-05-12 MED ORDER — PROPOFOL 500 MG/50ML IV EMUL
INTRAVENOUS | Status: AC
  Filled 2020-05-12: qty 50

## 2020-05-12 MED ORDER — PROPOFOL 10 MG/ML IV BOLUS
INTRAVENOUS | Status: DC | PRN
  Administered 2020-05-12: 50 mg via INTRAVENOUS
  Administered 2020-05-12: 30 mg via INTRAVENOUS
  Administered 2020-05-12: 50 mg via INTRAVENOUS

## 2020-05-12 MED ORDER — LACTATED RINGERS IV SOLN
INTRAVENOUS | Status: DC
  Administered 2020-05-12: 1000 mL via INTRAVENOUS

## 2020-05-12 MED ORDER — SODIUM CHLORIDE 0.9 % IV SOLN: INTRAVENOUS | Status: DC

## 2020-05-12 MED ORDER — ONDANSETRON HCL 4 MG/2ML IJ SOLN
INTRAMUSCULAR | Status: DC | PRN
  Administered 2020-05-12: 4 mg via INTRAVENOUS

## 2020-05-12 MED ORDER — LIDOCAINE 2% (20 MG/ML) 5 ML SYRINGE
INTRAMUSCULAR | Status: DC | PRN
  Administered 2020-05-12: 100 mg via INTRAVENOUS

## 2020-05-12 SURGICAL SUPPLY — 15 items

## 2020-05-12 NOTE — Discharge Instructions (Signed)
YOU HAD AN ENDOSCOPIC PROCEDURE TODAY: Refer to the procedure report and other information in the discharge instructions given to you for any specific questions about what was found during the examination. If this information does not answer your questions, please call [place] office at [phone] to clarify.   YOU SHOULD EXPECT: Some feelings of bloating in the abdomen. Passage of more gas than usual. Walking can help get rid of the air that was put into your GI tract during the procedure and reduce the bloating. If you had a lower endoscopy (such as a colonoscopy or flexible sigmoidoscopy) you may notice spotting of blood in your stool or on the toilet paper. Some abdominal soreness may be present for a day or two, also.  DIET: Your first meal following the procedure should be a light meal and then it is ok to progress to your normal diet. A half-sandwich or bowl of soup is an example of a good first meal. Heavy or fried foods are harder to digest and may make you feel nauseous or bloated. Drink plenty of fluids but you should avoid alcoholic beverages for 24 hours. If you had a esophageal dilation, please see attached instructions for diet.    ACTIVITY: Your care partner should take you home directly after the procedure. You should plan to take it easy, moving slowly for the rest of the day. You can resume normal activity the day after the procedure however YOU SHOULD NOT DRIVE, use power tools, machinery or perform tasks that involve climbing or major physical exertion for 24 hours (because of the sedation medicines used during the test).   SYMPTOMS TO REPORT IMMEDIATELY: A gastroenterologist can be reached at any hour. Please call [phone]  for any of the following symptoms:   Following upper endoscopy (EGD, EUS, ERCP, esophageal dilation) Vomiting of blood or coffee ground material  New, significant abdominal pain  New, significant chest pain or pain under the shoulder blades  Painful or  persistently difficult swallowing  New shortness of breath  Black, tarry-looking or red, bloody stools  FOLLOW UP:  If any biopsies were taken you will be contacted by phone or by letter within the next 1-3 weeks. Call [phone]  if you have not heard about the biopsies in 3 weeks.  Please also call with any specific questions about appointments or follow up tests.  

## 2020-05-12 NOTE — Anesthesia Postprocedure Evaluation (Signed)
Anesthesia Post Note  Patient: Robin Arellano  Procedure(s) Performed: UPPER ENDOSCOPIC ULTRASOUND (EUS) RADIAL (N/A ) ESOPHAGOGASTRODUODENOSCOPY (EGD) WITH PROPOFOL (N/A ) BIOPSY     Patient location during evaluation: PACU Anesthesia Type: MAC Level of consciousness: awake and alert and oriented Pain management: pain level controlled Vital Signs Assessment: post-procedure vital signs reviewed and stable Respiratory status: spontaneous breathing, nonlabored ventilation and respiratory function stable Cardiovascular status: stable and blood pressure returned to baseline Postop Assessment: no apparent nausea or vomiting Anesthetic complications: no   No complications documented.  Last Vitals:  Vitals:   05/12/20 0902 05/12/20 0910  BP: (!) 144/88 (!) 147/55  Pulse: 61 (!) 59  Resp: 19 15  Temp: 36.7 C   SpO2: 100% 97%    Last Pain:  Vitals:   05/12/20 0902  TempSrc: Oral  PainSc: 0-No pain                 Raenette Sakata A.

## 2020-05-12 NOTE — Telephone Encounter (Signed)
-----   Message from Milus Banister, MD sent at 05/12/2020  9:13 AM EDT ----- Marykay Lex, Just completed EUS.  She needs referral to [facility] for recent acute pancreatitis, likely from GB stones, debris. To consider elective cholecystectomy.  Thanks

## 2020-05-12 NOTE — Anesthesia Procedure Notes (Signed)
Procedure Name: MAC Date/Time: 05/12/2020 8:33 AM Performed by: Deliah Boston, CRNA Pre-anesthesia Checklist: Patient identified, Emergency Drugs available, Suction available and Patient being monitored Patient Re-evaluated:Patient Re-evaluated prior to induction Oxygen Delivery Method: Simple face mask Preoxygenation: Pre-oxygenation with 100% oxygen Induction Type: IV induction Ventilation: Mask ventilation without difficulty Placement Confirmation: positive ETCO2 and breath sounds checked- equal and bilateral Dental Injury: Teeth and Oropharynx as per pre-operative assessment

## 2020-05-12 NOTE — Transfer of Care (Signed)
Immediate Anesthesia Transfer of Care Note  Patient: Tiziana Lowry Bowl  Procedure(s) Performed: Procedure(s): UPPER ENDOSCOPIC ULTRASOUND (EUS) RADIAL (N/A) ESOPHAGOGASTRODUODENOSCOPY (EGD) WITH PROPOFOL (N/A) BIOPSY  Patient Location: PACU  Anesthesia Type:MAC  Level of Consciousness: Patient easily awoken, sedated, comfortable, cooperative, following commands, responds to stimulation.   Airway & Oxygen Therapy: Patient spontaneously breathing, ventilating well, oxygen via simple oxygen mask.  Post-op Assessment: Report given to PACU RN, vital signs reviewed and stable, moving all extremities.   Post vital signs: Reviewed and stable.  Complications: No apparent anesthesia complications  Last Vitals:  Vitals Value Taken Time  BP 144/88 05/12/20 0901  Temp    Pulse 63 05/12/20 0903  Resp 22 05/12/20 0903  SpO2 100 % 05/12/20 0903  Vitals shown include unvalidated device data.  Last Pain:  Vitals:   05/12/20 0803  TempSrc: Oral  PainSc: 0-No pain         Complications: No complications documented.

## 2020-05-12 NOTE — Op Note (Addendum)
[facility] Patient Name: Robin Arellano Procedure Date: 05/12/2020 MRN: [id number] Attending MD: Milus Banister , MD Date of Birth: December 13, 1950 CSN: [id number] Age: 69 Admit Type: Outpatient Procedure:                Upper EUS Indications:              recent moderate acute pancreatitis, unclear                            etiology. Transiently elevated liver tests,                            'fusiform" dilation of CBD (to 1.5cm by MR). Providers:                Milus Banister, MD, Benetta Spar RN, RN, Lazaro Arms, Technician Referring MD:              Medicines:                Monitored Anesthesia Care Complications:            No immediate complications. Estimated blood loss:                            None. Estimated Blood Loss:     Estimated blood loss: none. Procedure:                Pre-Anesthesia Assessment:                           - Prior to the procedure, a History and Physical                            was performed, and patient medications and                            allergies were reviewed. The patient's tolerance of                            previous anesthesia was also reviewed. The risks                            and benefits of the procedure and the sedation                            options and risks were discussed with the patient.                            All questions were answered, and informed consent                            was obtained. Prior Anticoagulants: The patient has                            taken  no previous anticoagulant or antiplatelet                            agents. ASA Grade Assessment: II - A patient with                            mild systemic disease. After reviewing the risks                            and benefits, the patient was deemed in                            satisfactory condition to undergo the procedure.                           After obtaining informed consent, the  endoscope was                            passed under direct vision. Throughout the                            procedure, the patient's blood pressure, pulse, and                            oxygen saturations were monitored continuously. The                            [id number]-[id number] ([id number]) Olympus Radial EUS was                            introduced through the mouth, and advanced to the                            second part of duodenum. The [id number]-[id number] ([id number])                            Olympus gastroscope was introduced through the                            mouth, and advanced to the second part of duodenum.                            The upper EUS was accomplished without difficulty.                            The patient tolerated the procedure well. Scope In: Scope Out: Findings:      ENDOSCOPIC FINDING: :      1. Normal esophagus.      2. Moderate non-specific distal gastritis including erythema, two small       erosions, granular mucosa. The stomach was biospied (antrum and body)       and sent to pathology.      3. Normal duodenum.      ENDOSONOGRAPHIC FINDING: :      1. There was a  small amount of amorphous, shadowing debris/sludge or       perhaps small stones in the gallbladder. GB wall was normal thickness.      2. CBD was 55mm in maximum dimension and contained no stones or debris.      3. Pancreatic parenchyma was normal, non-dilated.      4. Main pancreatic duct was normal, non-dilated.      5. Limited views of the liver, spleen, portal and splenic vessels were       all normal. Impression:               - Moderate, non-specific gastritis. Biopsied to                            check for H. pylori.                           - Amorphous, shadowing debris/sludge or perhaps                            small stones in the gallbladder.                           Most likely your pancreatitis last month was                            related to gallbladder debris or very  small stones.                            My office will arrange referral to [facility] to                            consider elective cholecystectomy. If biopsies from                            the stomach show H. pylori then you will be started                            on appropriate antibiotics. Moderate Sedation:      Not Applicable - Patient had care per Anesthesia. Recommendation:           - Discharge patient to home (ambulatory). Procedure Code(s):        --- Professional ---                           [phone], Esophagogastroduodenoscopy, flexible,                            transoral; with endoscopic ultrasound examination                            limited to the esophagus, stomach or duodenum, and                            adjacent structures  09811, Esophagogastroduodenoscopy, flexible,                            transoral; with biopsy, single or multiple Diagnosis Code(s):        --- Professional ---                           K29.70, Gastritis, unspecified, without bleeding                           K83.8, Other specified diseases of biliary tract CPT copyright 2019 American Medical Association. All rights reserved. The codes documented in this report are preliminary and upon coder review may  be revised to meet current compliance requirements. Milus Banister, MD 05/12/2020 9:12:17 AM This report has been signed electronically. Number of Addenda: 0

## 2020-05-12 NOTE — Telephone Encounter (Signed)
Referral made to [facility] records faxed.

## 2020-05-12 NOTE — H&P (Signed)
HPI: This is a woman with recent acute pancreatitis, transiently elevated liver tests, no stones in GB but "fusiform" dilation of CBD  ROS: complete GI ROS as described in HPI, all other review negative.  Constitutional:  No unintentional weight loss   Past Medical History:  Diagnosis Date  . ADHD   . Anxiety   . Back pain   . Chronic female pelvic pain   . Depression   . Fibromyalgia   . H/O leukocytosis   . Headache   . MI (myocardial infarction) ([place])    Pt states she did not have a MI- EKG was normal, was GERD  . Osteoarthritis   . Ovarian cyst, right   . Post-operative nausea and vomiting   . SVD (spontaneous vaginal delivery)    x 2  . Vitamin D deficiency     Past Surgical History:  Procedure Laterality Date  . ABDOMINAL HYSTERECTOMY  1994   [facility]  . COLONOSCOPY  08/12/2017   Hx polyp/[facility]  . KNEE SURGERY Bilateral 1996   x 2 - arthroscopic  . LEFT HEART CATH AND CORONARY ANGIOGRAPHY N/A 11/25/2018   Procedure: LEFT HEART CATH AND CORONARY ANGIOGRAPHY;  Surgeon: Troy Sine, MD;  Location: [facility];  Service: Cardiovascular;  Laterality: N/A;  . PELVIC LAPAROSCOPY  1989   W LYSIS OF ADHESIONS/L SALPINGONEOSTOMY  . WISDOM TOOTH EXTRACTION      Current Facility-Administered Medications  Medication Dose Route Frequency Provider Last Rate Last Admin  . 0.9 %  sodium chloride infusion   Intravenous Continuous Milus Banister, MD        Allergies as of 04/12/2020 - Review Complete 03/31/2020  Allergen Reaction Noted  . Prednisone Hives, Itching, and Swelling 05/26/2014  . Latex Rash 05/23/2017    Family History  Problem Relation Age of Onset  . Cancer Mother        UTERINE  . Aneurysm Father   . Heart disease Paternal Grandfather   . COPD Brother   . Skin cancer Brother   . Other Sister        MGUS   . Skin cancer Brother   . Other Sister        MA  . Colon cancer Neg Hx   . Rectal cancer Neg Hx   .  Stomach cancer Neg Hx   . Stroke Neg Hx   . Neuropathy Neg Hx     Social History   Socioeconomic History  . Marital status: Married    Spouse name: don  . Number of children: 2  . Years of education: College  . Highest education level: Not on file  Occupational History  . Occupation: Realtor  Tobacco Use  . Smoking status: Former Smoker    Packs/day: 0.15    Years: 20.00    Pack years: 3.00    Types: Cigarettes    Quit date: 11/12/1998    Years since quitting: 21.5  . Smokeless tobacco: Never Used  Vaping Use  . Vaping Use: Never used  Substance and Sexual Activity  . Alcohol use: No  . Drug use: No  . Sexual activity: Not Currently    Birth control/protection: Post-menopausal, Surgical    Comment: HYSTERECTOMY  Other Topics Concern  . Not on file  Social History Narrative   Lives at home with her husband Timmothy Sours   Right handed   Caffeine: unsweet tea, 2 glasses daily   Social Determinants of Health  Financial Resource Strain:   . Difficulty of Paying Living Expenses:   Food Insecurity:   . Worried About Charity fundraiser in the Last Year:   . Arboriculturist in the Last Year:   Transportation Needs:   . Film/video editor (Medical):   Marland Kitchen Lack of Transportation (Non-Medical):   Physical Activity:   . Days of Exercise per Week:   . Minutes of Exercise per Session:   Stress:   . Feeling of Stress :   Social Connections:   . Frequency of Communication with Friends and Family:   . Frequency of Social Gatherings with Friends and Family:   . Attends Religious Services:   . Active Member of Clubs or Organizations:   . Attends Archivist Meetings:   Marland Kitchen Marital Status:   Intimate Partner Violence:   . Fear of Current or Ex-Partner:   . Emotionally Abused:   Marland Kitchen Physically Abused:   . Sexually Abused:      Physical Exam: Ht 5\' 2"  (1.575 m)   Wt 76.2 kg   LMP  (LMP Unknown)   BMI 30.73 kg/m  Constitutional: generally well-appearing Psychiatric:  alert and oriented x3 Abdomen: soft, nontender, nondistended, no obvious ascites, no peritoneal signs, normal bowel sounds No peripheral edema noted in lower extremities  Assessment and plan: 69 y.o. female with recent pancreatitis, abnormal bile duct, elevated liver tests  For upper EUS evaluation today.  Please see the "Patient Instructions" section for addition details about the plan.  Owens Loffler, MD [facility] 05/12/2020, 7:57 AM

## 2020-05-13 ENCOUNTER — Other Ambulatory Visit: Payer: Self-pay

## 2020-05-13 LAB — SURGICAL PATHOLOGY

## 2020-05-17 ENCOUNTER — Other Ambulatory Visit: Payer: Self-pay

## 2020-05-17 ENCOUNTER — Encounter (HOSPITAL_COMMUNITY): Payer: Self-pay | Admitting: Gastroenterology

## 2020-05-17 MED ORDER — OMEPRAZOLE 40 MG PO CPDR
40.0000 mg | DELAYED_RELEASE_CAPSULE | Freq: Every day | ORAL | 6 refills | Status: DC
Start: 2020-05-17 — End: 2020-05-31

## 2020-05-18 DIAGNOSIS — R519 Headache, unspecified: Secondary | ICD-10-CM | POA: Diagnosis not present

## 2020-05-18 DIAGNOSIS — M5416 Radiculopathy, lumbar region: Secondary | ICD-10-CM | POA: Diagnosis not present

## 2020-05-18 DIAGNOSIS — M542 Cervicalgia: Secondary | ICD-10-CM | POA: Diagnosis not present

## 2020-05-18 DIAGNOSIS — M545 Low back pain: Secondary | ICD-10-CM | POA: Diagnosis not present

## 2020-05-25 NOTE — Telephone Encounter (Signed)
Pt called stating that [facility] has not contacted her yet. She stated that she hhad a rough week and almost went to the ER.

## 2020-05-25 NOTE — Telephone Encounter (Signed)
I spoke with [facility] and they state that the records are in review.  Earnest Bailey did go ahead and make an appt for 07/11/20 at 9 am arrive at 830 am at [facility] with Dr Barry Dienes. She will also be put on a wait list.  The pt has been advised.

## 2020-05-31 ENCOUNTER — Other Ambulatory Visit: Payer: Self-pay

## 2020-05-31 ENCOUNTER — Ambulatory Visit: Payer: PPO | Admitting: Cardiovascular Disease

## 2020-05-31 ENCOUNTER — Encounter: Payer: Self-pay | Admitting: Cardiovascular Disease

## 2020-05-31 DIAGNOSIS — M5090 Cervical disc disorder, unspecified, unspecified cervical region: Secondary | ICD-10-CM

## 2020-05-31 DIAGNOSIS — K859 Acute pancreatitis without necrosis or infection, unspecified: Secondary | ICD-10-CM

## 2020-05-31 DIAGNOSIS — K838 Other specified diseases of biliary tract: Secondary | ICD-10-CM

## 2020-05-31 DIAGNOSIS — Z79899 Other long term (current) drug therapy: Secondary | ICD-10-CM

## 2020-05-31 DIAGNOSIS — E785 Hyperlipidemia, unspecified: Secondary | ICD-10-CM | POA: Diagnosis not present

## 2020-05-31 DIAGNOSIS — I2510 Atherosclerotic heart disease of native coronary artery without angina pectoris: Secondary | ICD-10-CM

## 2020-05-31 DIAGNOSIS — I10 Essential (primary) hypertension: Secondary | ICD-10-CM | POA: Diagnosis not present

## 2020-05-31 MED ORDER — METOPROLOL TARTRATE 25 MG PO TABS
25.0000 mg | ORAL_TABLET | Freq: Two times a day (BID) | ORAL | 3 refills | Status: DC
Start: 2020-05-31 — End: 2020-07-07

## 2020-05-31 NOTE — Progress Notes (Signed)
Cardiology Office Note    Date:  05/31/2020   ID:  Robin Arellano, Robin Arellano, 1952, MRN [id number]  PCP:  Sandi Mariscal, MD  Cardiologist:  Shelva Majestic, MD   Chief Complaint  Patient presents with     F/U cardiology evaluation initally referred by Dr.Yun Sun at [facility] at [place]  History of Present Illness:  Robin Arellano is a 69 y.o. female who was referred to me by Dr. Nancy Fetter and was seen by me on October 06, 2018.  She presents for follow-up evaluation following her recent cardiac catheterization.  I had seen remotely Robin Arellano over 25 years ago in early 1994 with atypical chest pain. She was recently evaluated in September 22, 2018 by Dr. Aletta Edouard at [facility] with complaints of intermittent chest pain has been occurring off and on over the past year.  Prior to that evaluation, she was evaluated by her PCP and was told that she may have had an old heart attack based on her ECG.  During her ER evaluation she was significantly hypertensive with a blood pressure 187/100.  A chest x-ray did not show any acute abnormalities.  She had experienced some transient left facial numbness for 5 days associated with some dizziness and a CT was done which revealed atrophy with small vessel chronic ischemic changes of deep cerebral white matter without acute intracranial abnormalities.  She has recently been evaluated at [facility] on [address] by Dr. Theressa Millard and she is felt to have prediabetes.  Lipid studies were increased with a total cholesterol of 319, triglycerides 209, LDL cholesterol 224, and she was told to initiate Crestor 10 mg which she has not yet started.  Her sedimentation rate was elevated at 64.  ANA was negative.  RF was normal.  Thyroid studies were normal.  Because of her recent symptomatology, she was referred for cardiology evaluation.  When I saw her in November 2019 she complained of some intermittent chest discomfort which occurs in the center  of her chest and seems to radiate to her left shoulder. It is aggravated when she turns a certain way and may last for hours and then ultimately resolves on its own.  She was told that this may be from "Iinside shingles."   She denied any exertional symptomatology.  Additional history is notable for chronic bilateral low back pain with bilateral sciatica.  She also  has fibromyalgia and osteoarthritis.  Has a history of anxiety and depression.  During my evaluation, I reviewed laboratory from her primary physician which suggested a high likelihood for familial hyperlipidemia with a total cholesterol at 319, LDL cholesterol at 224, triglycerides of 209, and HDL of 53.  With her recurrent chest pain which most likely had musculoskeletal features and her significant hyperlipidemia I recommended she undergo coronary CT a to evaluate potential coronary atherosclerosis and recommended an echo Doppler evaluation particularly with her hypertensive history.  The echo Doppler study was done on October 14, 2018 and showed a normal LV function and normal LV strain with an EF of 60 to 65%.  There was mild aortic valve sclerosis without stenosis, mild MR, and a mildly thickened atrial septum consistent with lipomatous hypertrophy.  She underwent coronary CTA on November 06, 2018 which showed moderate coronary plaque with moderate tubular atherosclerosis in the proximal RCA and mid RCA in the 50 to 69% range.  Left main coronary artery had mild plaque less than 25%.  Her LAD had mild proximal plaque in  the 25 to 49% range, also had a moderate long segment of atherosclerotic plaque in the mid LAD just beyond the second diagonal vessel of 50 to 69%.  She was felt to have possible severe stenosis in the distal LAD with 70 to 99% plaque.  She had a small caliber ramus intermediate vessel.  The circumflex had mild to moderate plaque of 25 to 49% proximally, and then 50 to 69% stenosis.  Subsequent FFR analysis not reveal any  significant flow-limiting stenosis with the exception of the distal LAD where the FFR was 0.57.   When I last saw her November 20, 2018 she denied any classic exertional chest pain.  Her chest pain has been recurring and is described as being sharp and knifelike.  Typically it is of short duration.  She also has recently started rosuvastatin 20 mg and has begun to notice some calf discomfort attributed to initiation of statin therapy.    With her abnormal coronary CT angiogram definitive cardiac catheterization was recommended.  She underwent this catheterization by me on November 25, 2018 which revealed mild multivessel nonobstructive CAD with narrowings no greater than 25% in the LAD, circumflex marginal, and dominant RCA.  Medical therapy was recommended with aggressive lipid-lowering therapy with target LDL less than 70.  It was felt that the patient's chest pain had atypical features and most likely was nonischemic.  As her catheterization she ultimately underwent neurologic evaluation and subsequent MRI of her cervical spine which showed mild multilevel degenerative spondylolisthesis in the cervical spine with associated widespread cervical facet arthropathy.  There was superimposed cervical disc and endplate degeneration maximal at C5 and 6 but no associated cervical spinal stenosis.  There was moderate neuroforaminal stenosis at the right C4 right C5 bilateral C6 and left C7 nerve levels.  He has been seeing Dr. Maryjean Ka for pain management and had undergone epidural injection.  She also sees Dr. Vertell Limber.  She denies any exertional chest pain.  She has continued to take rosuvastatin for hyperlipidemia.  She was last evaluated by me in a telemedicine encounter on February 16, 2020.  Since her prior evaluation she had undergone successful cervical disc surgery by Dr. Vertell Limber.  Prior to surgery she was having severe pain bilaterally in her arms.  This has essentially resolved.  She is now able to be more active.   Prior to her surgery she could not walk well due to significant pain.  She also tells me that she does have issues with her lower back and in August will be undergoing an MRI and may also be undergoing an injection.  If symptoms progress she may require future low back surgery.  At her telemedicine visit, she denied any chest pain.  Apparently she had only been taking  metoprolol tartrate 25 mg once a day instead of as prescribed twice a day.  She has noticed some blood pressure elevation.  She denies any swelling.  She denies any anginal symptoms.  During her recent injection her blood pressure did significantly increase.  During that evaluation I suggested changing to metoprolol succinate initially 25 mg but her dose may need to be further titrated to 50 mg.  At that time she was on rosuvastatin 5 mg with target LDL less than 70.  Since her last evaluation, she apparently was hospitalized for 6 days in May 2021 with a diagnosis of acute pancreatitis, common bile duct dilatation, she is scheduled to see Dr. Barry Dienes at [facility] surgery for possible future cholecystectomy.  During her hospitalization, she apparently was taken off rosuvastatin and also was taken off metoprolol succinate.  She denies chest pain PND orthopnea.  She presents for evaluation.  Past Medical History:  Diagnosis Date  . ADHD   . Anxiety   . Back pain   . Chronic female pelvic pain   . Depression   . Fibromyalgia   . H/O leukocytosis   . Headache   . MI (myocardial infarction) ([place])    Pt states she did not have a MI- EKG was normal, was GERD  . Osteoarthritis   . Ovarian cyst, right   . Post-operative nausea and vomiting   . SVD (spontaneous vaginal delivery)    x 2  . Vitamin D deficiency     Past Surgical History:  Procedure Laterality Date  . ABDOMINAL HYSTERECTOMY  1994   [facility].[place]  . BIOPSY  05/12/2020   Procedure: BIOPSY;  Surgeon: Milus Banister, MD;  Location: [facility];   Service: Endoscopy;;  . COLONOSCOPY  08/12/2017   Hx polyp/[place]  . ESOPHAGOGASTRODUODENOSCOPY (EGD) WITH PROPOFOL N/A 05/12/2020   Procedure: ESOPHAGOGASTRODUODENOSCOPY (EGD) WITH PROPOFOL;  Surgeon: Milus Banister, MD;  Location: [facility];  Service: Endoscopy;  Laterality: N/A;  . EUS N/A 05/12/2020   Procedure: UPPER ENDOSCOPIC ULTRASOUND (EUS) RADIAL;  Surgeon: Milus Banister, MD;  Location: [facility];  Service: Endoscopy;  Laterality: N/A;  . KNEE SURGERY Bilateral 1996   x 2 - arthroscopic  . LEFT HEART CATH AND CORONARY ANGIOGRAPHY N/A 11/25/2018   Procedure: LEFT HEART CATH AND CORONARY ANGIOGRAPHY;  Surgeon: Troy Sine, MD;  Location: [place] CV LAB;  Service: Cardiovascular;  Laterality: N/A;  . PELVIC LAPAROSCOPY  1989   W LYSIS OF ADHESIONS/L SALPINGONEOSTOMY  . WISDOM TOOTH EXTRACTION      Current Medications: Outpatient Medications Prior to Visit  Medication Sig Dispense Refill  . ALPRAZolam (XANAX) 0.5 MG tablet Take 0.5 mg by mouth at bedtime as needed for anxiety or sleep.     . Ascorbic Acid (VITAMIN C) 1000 MG tablet Take 1,000 mg by mouth daily.    Marland Kitchen aspirin EC 81 MG tablet Take 81 mg by mouth daily.    . butalbital-acetaminophen-caffeine (FIORICET, ESGIC) 50-325-40 MG tablet Take 1 tablet by mouth every 6 (six) hours as needed for headache.   0  . Cholecalciferol (VITAMIN D) 50 MCG (2000 UT) tablet Take 2,000 Units by mouth daily.     Marland Kitchen ezetimibe (ZETIA) 10 MG tablet Take 1 tablet (10 mg total) by mouth daily. 90 tablet 3  . HYDROcodone-acetaminophen (NORCO) 10-325 MG tablet Take 1 tablet by mouth 2 (two) times daily as needed.    . Multiple Vitamin (MULTIVITAMIN WITH MINERALS) TABS tablet Take 1 tablet by mouth daily.    . ondansetron (ZOFRAN) 4 MG tablet Take 1 tablet (4 mg total) by mouth 2 (two) times daily. 30 tablet 3  . valACYclovir (VALTREX) 500 MG tablet TAKE 1 TABLET BY MOUTH TWICE DAILY FOR 3 TO 5 DAYS THEN TAKE DAILY AS NEEDED (Patient  taking differently: Take 500 mg by mouth every Monday, Wednesday, and Friday. ) 30 tablet 0  . zolpidem (AMBIEN) 10 MG tablet Take 10 mg by mouth at bedtime as needed for sleep.     Marland Kitchen ibuprofen (ADVIL,MOTRIN) 200 MG tablet Take 200 mg by mouth daily as needed for mild pain.     . metoprolol succinate (TOPROL-XL) 25 MG 24 hr tablet Take 1  tablet (25 mg total) by mouth daily. 90 tablet 3  . omeprazole (PRILOSEC) 40 MG capsule Take 1 capsule (40 mg total) by mouth daily. 30 capsule 6  . amLODipine (NORVASC) 10 MG tablet Take 1 tablet (10 mg total) by mouth daily. 90 tablet 3  . ferrous sulfate 325 (65 FE) MG tablet Take 1 tablet (325 mg total) by mouth daily with breakfast. (Patient taking differently: Take 325 mg by mouth daily with breakfast. 1 tablet every other day) 30 tablet 0  . lisinopril (ZESTRIL) 5 MG tablet Take 1 tablet (5 mg total) by mouth daily. 30 tablet 0   No facility-administered medications prior to visit.     Allergies:   Prednisone and Latex   Social History   Socioeconomic History  . Marital status: Married    Spouse name: don  . Number of children: 2  . Years of education: College  . Highest education level: Not on file  Occupational History  . Occupation: Realtor  Tobacco Use  . Smoking status: Former Smoker    Packs/day: 0.15    Years: 20.00    Pack years: 3.00    Types: Cigarettes    Quit date: 11/12/1998    Years since quitting: 21.5  . Smokeless tobacco: Never Used  Vaping Use  . Vaping Use: Never used  Substance and Sexual Activity  . Alcohol use: No  . Drug use: No  . Sexual activity: Not Currently    Birth control/protection: Post-menopausal, Surgical    Comment: HYSTERECTOMY  Other Topics Concern  . Not on file  Social History Narrative   Lives at home with her husband Timmothy Sours   Right handed   Caffeine: unsweet tea, 2 glasses daily   Social Determinants of Health   Financial Resource Strain:   . Difficulty of Paying Living Expenses:   Food  Insecurity:   . Worried About Charity fundraiser in the Last Year:   . Arboriculturist in the Last Year:   Transportation Needs:   . Film/video editor (Medical):   Marland Kitchen Lack of Transportation (Non-Medical):   Physical Activity:   . Days of Exercise per Week:   . Minutes of Exercise per Session:   Stress:   . Feeling of Stress :   Social Connections:   . Frequency of Communication with Friends and Family:   . Frequency of Social Gatherings with Friends and Family:   . Attends Religious Services:   . Active Member of Clubs or Organizations:   . Attends Archivist Meetings:   Marland Kitchen Marital Status:     Social history is notable in that she has had several marriages and currently has been married for 16 years.  She has 2 children, 2 grandchildren.  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  There is a higher tobacco history, she quit in 2002.  She does yoga intermittently.  Family History:  The patient's family history includes Aneurysm in her father; COPD in her brother; Cancer in her mother; Heart disease in her paternal grandfather; Other in her sister and sister; Skin cancer in her brother and brother.   Her mother died at age 74.  Her father died at age 60 and had blood clots.  A brother died at age 26 and was exposed to agent orange.  She has a brother age 22 with cancer.  She has 5 sisters.   ROS General: Negative; No fevers, chills, or night sweats;  HEENT: Negative;  No changes in vision or hearing, sinus congestion, difficulty swallowing Pulmonary: Negative; No cough, wheezing, shortness of breath, hemoptysis Cardiovascular: See HPI GI: Negative; No nausea, vomiting, diarrhea, or abdominal pain GU: Recent UTI, just started on Macrobid Musculoskeletal: Chronic low back pain, osteo-arthritis, fibromyalgia Hematologic/Oncology: Negative; no easy bruising, bleeding Endocrine: Negative; no heat/cold intolerance; no diabetes Neuro: Negative; no changes in balance,  headaches Skin: Negative; No rashes or skin lesions Psychiatric: Negative; No behavioral problems, depression Sleep: Negative; No snoring, daytime sleepiness, hypersomnolence, bruxism, restless legs, hypnogognic hallucinations, no cataplexy Other comprehensive 14 point system review is negative.   PHYSICAL EXAM:   VS:  BP (!) 180/100 (BP Location: Right Arm, Patient Position: Sitting, Cuff Size: Normal)   Pulse 66   Ht '5\' 3"'$  (1.6 m)   Wt 160 lb (72.6 kg)   LMP  (LMP Unknown)   BMI 28.34 kg/m     Repeat blood pressure by me was significantly elevated at 182/100.  She states her blood pressure this morning at home was 158/80.  Just prior to leaving her home she apparently tripped and fell down several flights of steps.  She did experience some back discomfort.  Wt Readings from Last 3 Encounters:  05/31/20 160 lb (72.6 kg)  05/12/20 167 lb 15.9 oz (76.2 kg)  04/01/20 169 lb 8.5 oz (76.9 kg)    General: Alert, oriented, no distress.  Skin: normal turgor, no rashes, warm and dry HEENT: Normocephalic, atraumatic. Pupils equal round and reactive to light; sclera anicteric; extraocular muscles intact;  Nose without nasal septal hypertrophy Mouth/Parynx benign; Mallinpatti scale 2 Neck: No JVD, no carotid bruits; normal carotid upstroke Lungs: clear to ausculatation and percussion; no wheezing or rales Chest wall: without tenderness to palpitation Heart: PMI not displaced, RRR, s1 s2 normal, 1/6 systolic murmur, no diastolic murmur, no rubs, gallops, thrills, or heaves Abdomen: soft, nontender; no hepatosplenomehaly, BS+; abdominal aorta nontender and not dilated by palpation. Back: no CVA tenderness Pulses 2+ Musculoskeletal: full range of motion, normal strength, no joint deformities Extremities: no clubbing cyanosis or edema, Homan's sign negative  Neurologic: grossly nonfocal; Cranial nerves grossly wnl Psychologic: Normal mood and affect   Studies/Labs Reviewed:   ECG  (independently read by me): Normal sinus rhythm at 66 bpm.  Left atrial enlargement.  Nonspecific ST changes.  QTc interval 461 Robin.  March 2020 ECG (independently read by me): Sinus bradycardia 59 bpm.  Normal intervals.  No ectopy.  November 20, 2018 EKG:  EKG is ordered today. Sinus Bradycardia at 58;Q wave III, no STT changes  October 06, 2018 ECG (independently read by me): Normal sinus rhythm at 66 bpm.  Nondiagnostic Q waves in lead III and aVF.  Normal intervals.  No ectopy.  No ST segment changes.  Recent Labs: BMP Latest Ref Rng & Units 04/20/2020 04/07/2020 04/06/2020  Glucose 70 - 99 mg/dL 106(H) 96 90  BUN 6 - 23 mg/dL 9 5(L) 6(L)  Creatinine 0.40 - 1.20 mg/dL 1.15 0.88 0.90  BUN/Creat Ratio 12 - 28 - - -  Sodium 135 - 145 mEq/L 132(L) 136 137  Potassium 3.5 - 5.1 mEq/L 4.5 3.9 4.2  Chloride 96 - 112 mEq/L 100 98 103  CO2 Arellano - 32 mEq/L '26 25 26  '$ Calcium 8.4 - 10.5 mg/dL 9.5 8.4(L) 9.1     Hepatic Function Latest Ref Rng & Units 04/20/2020 04/07/2020 04/06/2020  Total Protein 6.0 - 8.3 g/dL 7.6 6.6 6.7  Albumin 3.5 - 5.2 g/dL 4.0 3.0(L) 3.3(L)  AST 0 - 37 U/L '16 20 21  '$ ALT 0 - 35 U/L 11 40 50(H)  Alk Phosphatase 39 - 117 U/L 99 104 109  Total Bilirubin 0.2 - 1.2 mg/dL 0.3 0.6 0.2(L)  Bilirubin, Direct 0.0 - 0.2 mg/dL - - -    CBC Latest Ref Rng & Units 04/20/2020 04/07/2020 04/06/2020  WBC 4.0 - 10.5 K/uL 11.8(H) 7.5 8.4  Hemoglobin 12.0 - 15.0 g/dL 11.6(L) 8.9(L) 9.3(L)  Hematocrit 36 - 46 % 35.0(L) 28.1(L) 29.4(L)  Platelets 150 - 400 K/uL 465.0(H) 244 249   Lab Results  Component Value Date   MCV 96.7 04/20/2020   MCV 101.1 (H) 04/07/2020   MCV 101.0 (H) 04/06/2020   Lab Results  Component Value Date   TSH 1.600 03/08/2020   No results found for: HGBA1C   BNP No results found for: BNP  ProBNP No results found for: PROBNP   Lipid Panel     Component Value Date/Time   CHOL 155 03/08/2020 1300   TRIG 138 03/08/2020 1300   HDL 49 03/08/2020 1300   CHOLHDL  3.2 03/08/2020 1300   LDLCALC 82 03/08/2020 1300     RADIOLOGY: No results found.   Additional studies/ records that were reviewed today include:  I reviewed the records from the emergency room.  I reviewed the records from [place] center at [facility] by Arville Go.   ------------------------------------------------------------------- 10/14/2018 ECHO Study Conclusions  - Left ventricle: Average global longitudinal LV strain is normal   at -18.7% The cavity size was normal. Systolic function was   normal. The estimated ejection fraction was in the range of 60%   to 65%. Wall motion was normal; there were no regional wall   motion abnormalities. The study is not technically sufficient to   allow evaluation of LV diastolic function. - Aortic valve: Trileaflet; mildly thickened, mildly calcified   leaflets. - Mitral valve: There was mild regurgitation. - Atrial septum: There was increased thickness of the septum,   consistent with lipomatous hypertrophy.   ASSESSMENT:    1. CAD in native artery   2. Medication management   3. Essential hypertension   4. Hyperlipidemia with target LDL less than 70   5. Cervical disc disease   6. Coronary artery disease involving native coronary artery of native heart without angina pectoris      PLAN:  Robin Arellano is a 69 year old female who has experienced intermittent episodes of somewhat atypical chest discomfort.  Her chest pain has been occurring off and on for the past year typically is nonexertional.  Her ECG shows normal sinus rhythm.  She has small nondiagnostic inferior Q waves.  I do not believe she has had a prior myocardial infarction.  She has stage II hypertension and her blood pressure was significantly elevated at her emergency room evaluation.  When I initially saw her I recommended the addition of amlodipine to her medical regimen. Her echo Doppler study demonstrated normal systolic without wall motion  abnormalities and with normal global longitudinal strain.  There was evidence for mild aortic sclerosis without stenosis, mild MR, and probable lipomatous hypertrophy of her atrial septum.  She  continued to experience sharp stabbing-like chest pain and previously on physical examination today she had definite tenderness to her costochondral region pectoral region suggesting her chest pain is most likely of musculoskeletal etiology.  Her CT angiogram raised the possibility of at least moderate multivessel coronary atherosclerosis and her distal LAD lesion was felt to be  FFR significant.  As result cardiac catheterization was performed which did not demonstrate high-grade obstructive disease with no narrowings greater than 25% in the LAD circumflex marginal and dominant RCA.  She has subsequently been found to have multilevel cervical generative spondylolisthesis as well as cervical disc disease as noted above.  She underwent successful cervical disc surgery by Dr. Vertell Limber with significant benefit in improvement in her severe bilateral arm discomfort.  She recently had experienced an episode of pancreatitis with dilated bile duct and is in need for possible cholecystectomy.  Apparently during her hospitalization her rosuvastatin as well as metoprolol succinate were discontinued.  Her blood pressure today is labile and elevated on amlodipine 10 mg.  I have recommended resumption of metoprolol tartrate 25 mg twice a day since she was hesitant to resume the succinate after one of her physicians raise concern.  With her discontinuance of rosuvastatin I have recommended initiation of Zetia 10 mg presently to continue aiming for target LDL less than 70.  She has a certain trickle appointment with Dr. Barry Dienes in August 16.  I have suggested CC our pharmacist in several weeks after initiation of metoprolol succinate to make certain her blood pressure lability has stabilized.  If not additional medication will be necessary.  She  is not having any chest pain or anginal symptoms.  I will see her in 3 months for follow-up evaluation.   Medication Adjustments/Labs and Tests Ordered: Current medicines are reviewed at length with the patient today.  Concerns regarding medicines are outlined above.  Medication changes, Labs and Tests ordered today are listed in the Patient Instructions below. Patient Instructions  Medication Instructions:  BEGIN TAKING METOPROLOL TARTRATE '25MG'$  TWICE A DAY  *If you need a refill on your cardiac medications before your next appointment, please call your pharmacy*   Follow-Up: At [facility], you and your health needs are our priority.  As part of our continuing mission to provide you with exceptional heart care, we have created designated Provider Care Teams.  These Care Teams include your primary Cardiologist (physician) and Advanced Practice Providers (APPs -  Physician Assistants and Nurse Practitioners) who all work together to provide you with the care you need, when you need it.  We recommend signing up for the patient portal called "MyChart".  Sign up information is provided on this After Visit Summary.  MyChart is used to connect with patients for Virtual Visits (Telemedicine).  Patients are able to view lab/test results, encounter notes, upcoming appointments, etc.  Non-urgent messages can be sent to your provider as well.   To learn more about what you can do with MyChart, go to [email or web address].    Your next appointment:   3-4 month(s)  The format for your next appointment:   In Person  Provider:   Shelva Majestic, MD   Other Instructions FOLLOW UP [place] TO 06/27/20 TO REVIEW BP     Signed, Shelva Majestic, MD  05/31/2020 6:29 PM    [place] [address] [place], [place], [place]  [address] Phone: [phone]

## 2020-05-31 NOTE — Patient Instructions (Signed)
Medication Instructions:  BEGIN TAKING METOPROLOL TARTRATE 25MG  TWICE A DAY  *If you need a refill on your cardiac medications before your next appointment, please call your pharmacy*   Follow-Up: At [facility], you and your health needs are our priority.  As part of our continuing mission to provide you with exceptional heart care, we have created designated Provider Care Teams.  These Care Teams include your primary Cardiologist (physician) and Advanced Practice Providers (APPs -  Physician Assistants and Nurse Practitioners) who all work together to provide you with the care you need, when you need it.  We recommend signing up for the patient portal called "MyChart".  Sign up information is provided on this After Visit Summary.  MyChart is used to connect with patients for Virtual Visits (Telemedicine).  Patients are able to view lab/test results, encounter notes, upcoming appointments, etc.  Non-urgent messages can be sent to your provider as well.   To learn more about what you can do with MyChart, go to [email or web address].    Your next appointment:   3-4 month(s)  The format for your next appointment:   In Person  Provider:   Shelva Majestic, MD   Other Instructions FOLLOW UP WITH [place] TO 06/27/20 TO REVIEW BP

## 2020-06-27 ENCOUNTER — Other Ambulatory Visit: Payer: Self-pay | Admitting: General Surgery

## 2020-06-27 DIAGNOSIS — K851 Biliary acute pancreatitis without necrosis or infection: Secondary | ICD-10-CM | POA: Diagnosis not present

## 2020-07-04 DIAGNOSIS — M5416 Radiculopathy, lumbar region: Secondary | ICD-10-CM | POA: Diagnosis not present

## 2020-07-07 ENCOUNTER — Other Ambulatory Visit: Payer: Self-pay | Admitting: Cardiovascular Disease

## 2020-07-07 ENCOUNTER — Other Ambulatory Visit: Payer: Self-pay

## 2020-07-07 ENCOUNTER — Ambulatory Visit (INDEPENDENT_AMBULATORY_CARE_PROVIDER_SITE_OTHER): Payer: PPO | Admitting: Pharmacist

## 2020-07-07 DIAGNOSIS — I10 Essential (primary) hypertension: Secondary | ICD-10-CM | POA: Diagnosis not present

## 2020-07-07 MED ORDER — LISINOPRIL 5 MG PO TABS
5.0000 mg | ORAL_TABLET | Freq: Every day | ORAL | 1 refills | Status: DC
Start: 2020-07-07 — End: 2020-07-07

## 2020-07-07 MED ORDER — METOPROLOL TARTRATE 25 MG PO TABS: 12.5000 mg | ORAL_TABLET | Freq: Two times a day (BID) | ORAL | 1 refills | Status: DC

## 2020-07-07 NOTE — Telephone Encounter (Signed)
Rx has been sent to the pharmacy electronically. ° °

## 2020-07-07 NOTE — Patient Instructions (Addendum)
Return for a follow up appointment in 1 week (by phone)  Check your blood pressure at home daily (if able) and keep record of the readings.  Take your BP meds as follows: *START taking lisinopril 5mg  daily* *DECERASE metoprolol to 12.5mg  twice daily  *Will plan to increase lisinopril if needed for addition blood pressure control*  Bring all of your meds, your BP cuff and your record of home blood pressures to your next appointment.  Exercise as you're able, try to walk approximately 30 minutes per day.  Keep salt intake to a minimum, especially watch canned and prepared boxed foods.  Eat more fresh fruits and vegetables and fewer canned items.  Avoid eating in fast food restaurants.    HOW TO TAKE YOUR BLOOD PRESSURE: . Rest 5 minutes before taking your blood pressure. .  Don't smoke or drink caffeinated beverages for at least 30 minutes before. . Take your blood pressure before (not after) you eat. . Sit comfortably with your back supported and both feet on the floor (don't cross your legs). . Elevate your arm to heart level on a table or a desk. . Use the proper sized cuff. It should fit smoothly and snugly around your bare upper arm. There should be enough room to slip a fingertip under the cuff. The bottom edge of the cuff should be 1 inch above the crease of the elbow. . Ideally, take 3 measurements at one sitting and record the average.

## 2020-07-07 NOTE — Progress Notes (Signed)
Patient ID: Robin Arellano                 DOB: July 14, 1951                      MRN: [id number]     HPI:  Robin Arellano is a 69 y.o. female referred by Dr. Claiborne Billings to HTN clinic. PMH includes atypical chest pain, depression, MI, "white coat syndrome", common bile duct dilation, pancreatitis, and chronic neck pain. She reports having 3 steroid injections on her back yesterday and 22lb weight loss since last hospital admission on 05/12/2020. Noted scheduled laparoscopic cholecystectomy on 07/21/2020. She also reports worsening visceral spasms with metoprolol. Denies dizziness, blurry vision, headaches otr any other problems at this time.  Current HTN meds:  Amlodipine 10mg  daily Metoprolol 25mg  twice daily  Previously tried:  lisinopril 5mg   Clonidine patch 0.1 weekly  BP goal: < 130/80  Family History: family history includes Aneurysm in her father; COPD in her brother; Cancer in her mother; Heart disease in her paternal grandfather; Other in her sister and sister; Skin cancer in her brother and brother.  Her mother died at age 38.  Her father died at age 26 and had blood clots.  A brother died at age 84 and was exposed to agent orange.  She has a brother age 7 with cancer.  She has 5 sisters.  Social History: former smoker, denies alcohol use, 2 glasses of tea per day  Diet: eating what she can this days d/t issues with gallbladder and recent pancreatitis. Noted decreased appetite and frequent nausea.  Exercise: activities of daily living  Home BP readings: no records provided and not home BP cuff available to determine accuracy.  Patient reports systolic between 147-829F and diastolic 62-13  Wt Readings from Last 3 Encounters:  07/07/20 158 lb (71.7 kg)  05/31/20 160 lb (72.6 kg)  05/12/20 167 lb 15.9 oz (76.2 kg)   BP Readings from Last 3 Encounters:  07/07/20 (!) 200/98  05/31/20 (!) 180/100  05/12/20 (!) 176/68   Pulse Readings from Last 3 Encounters:  07/07/20 60    05/31/20 66  05/12/20 62    Past Medical History:  Diagnosis Date   ADHD    Anxiety    Back pain    Chronic female pelvic pain    Depression    Fibromyalgia    H/O leukocytosis    Headache    MI (myocardial infarction) ([place])    Pt states she did not have a MI- EKG was normal, was GERD   Osteoarthritis    Ovarian cyst, right    Post-operative nausea and vomiting    SVD (spontaneous vaginal delivery)    x 2   Vitamin D deficiency     Current Outpatient Medications on File Prior to Visit  Medication Sig Dispense Refill   ALPRAZolam (XANAX) 0.5 MG tablet Take 0.5 mg by mouth at bedtime as needed for anxiety or sleep.      amLODipine (NORVASC) 10 MG tablet Take 1 tablet (10 mg total) by mouth daily. 90 tablet 3   Ascorbic Acid (VITAMIN C) 1000 MG tablet Take 1,000 mg by mouth daily.     butalbital-acetaminophen-caffeine (FIORICET, ESGIC) 50-325-40 MG tablet Take 1 tablet by mouth every 6 (six) hours as needed for headache.   0   Cholecalciferol (VITAMIN D) 50 MCG (2000 UT) tablet Take 2,000 Units by mouth daily.      ezetimibe (ZETIA) 10 MG  tablet Take 1 tablet (10 mg total) by mouth daily. 90 tablet 3   ferrous sulfate 325 (65 FE) MG tablet Take 1 tablet (325 mg total) by mouth daily with breakfast. (Patient taking differently: Take 325 mg by mouth daily with breakfast. 1 tablet every other day) 30 tablet 0   HYDROcodone-acetaminophen (NORCO) 10-325 MG tablet Take 1 tablet by mouth 2 (two) times daily as needed for moderate pain.      Multiple Vitamin (MULTIVITAMIN WITH MINERALS) TABS tablet Take 1 tablet by mouth daily.     ondansetron (ZOFRAN) 4 MG tablet Take 1 tablet (4 mg total) by mouth 2 (two) times daily. (Patient not taking: Reported on 07/08/2020) 30 tablet 3   valACYclovir (VALTREX) 500 MG tablet TAKE 1 TABLET BY MOUTH TWICE DAILY FOR 3 TO 5 DAYS THEN TAKE DAILY AS NEEDED (Patient taking differently: Take 500 mg by mouth daily as needed (shingles).  ) 30 tablet 0   zolpidem (AMBIEN) 10 MG tablet Take 10 mg by mouth at bedtime as needed for sleep.      aspirin EC 81 MG tablet Take 81 mg by mouth daily. (Patient not taking: Reported on 07/07/2020)     omeprazole (PRILOSEC) 40 MG capsule Take 40 mg by mouth daily.     No current facility-administered medications on file prior to visit.    Allergies  Allergen Reactions   Prednisone Hives, Itching and Swelling    Pt says this no longer bothers her   Latex Rash    Blood pressure (!) 200/98, pulse 60, resp. rate 14, height 5' 2.5" (1.588 m), weight 158 lb (71.7 kg), SpO2 97 %.  Hypertension Blood pressure at 224/110 at initial reading, down to 200/98 after resting for few minutes. Patient reports she is currently in pain, received 3 steroid injections yesterday, and had "wife coat syndrome". Think she may be having worsening visceral spasm with metoprolol, but still taking d/t elevated BP. Noted she took lisinopril in the past with "better BP control", and unknown reason for discontinuation.  Patient understand we can not stop metoprolol immediatly especially with current BP readings.   Will add lisinopril 5mg  daily yo her regimen, decrease metoprolol to 12.5mg  BID, and follow up in 1 week. Patient will prefer to avoid in person OV. I will follow up by phoe and schedule OV once home BP reading discussed next week.  I strongly encouraged visit to emergency room if blurry vision, headaches, slurred speech or paralysis in one side occurs. She understand risk of stroke if BP not property controlled.    Keyunna Coco Rodriguez-Guzman PharmD, BCPS, [place] [address] [place] [address] 07/11/2020 9:30 AM

## 2020-07-11 ENCOUNTER — Encounter: Payer: Self-pay | Admitting: Pharmacist

## 2020-07-11 DIAGNOSIS — I10 Essential (primary) hypertension: Secondary | ICD-10-CM | POA: Insufficient documentation

## 2020-07-11 NOTE — Assessment & Plan Note (Signed)
Blood pressure at 224/110 at initial reading, down to 200/98 after resting for few minutes. Patient reports she is currently in pain, received 3 steroid injections yesterday, and had "wife coat syndrome". Think she may be having worsening visceral spasm with metoprolol, but still taking d/t elevated BP. Noted she took lisinopril in the past with "better BP control", and unknown reason for discontinuation.  Patient understand we can not stop metoprolol immediatly especially with current BP readings.   Will add lisinopril 5mg  daily yo her regimen, decrease metoprolol to 12.5mg  BID, and follow up in 1 week. Patient will prefer to avoid in person OV. I will follow up by phoe and schedule OV once home BP reading discussed next week.  I strongly encouraged visit to emergency room if blurry vision, headaches, slurred speech or paralysis in one side occurs. She understand risk of stroke if BP not property controlled.

## 2020-07-12 NOTE — Progress Notes (Signed)
[facility] [place], [place] - [address] OF [place] [address] [place] [address] Phone: [phone] Fax: [phone]      Your procedure is scheduled on Thursday, July 21, 2020.  Report to [facility] Entrance "A" at 12:30 P.M., and check in at the Admitting office.  Call this number if you have problems the morning of surgery:  [phone]  Call [phone] if you have any questions prior to your surgery date Monday-Friday 8am-4pm    Remember:  Do not eat after midnight the night before your surgery  You may drink clear liquids until 11:30 AM the morning of your surgery.   Clear liquids allowed are: Water, Non-Citrus Juices (without pulp), Carbonated Beverages, Clear Tea, Black Coffee Only, and Gatorade  Please complete your PRE-SURGERY ENSURE that was provided to you by 11:30 PM the morning of surgery.  Please, if able, drink it in one setting. DO NOT SIP.     Take these medicines the morning of surgery with A SIP OF WATER:  amLODipine (NORVASC) ezetimibe (ZETIA) metoprolol tartrate (LOPRESSOR) omeprazole (PRILOSEC)   AS NEEDED:  butalbital-acetaminophen-caffeine (FIORICET, ESGIC) HYDROcodone-acetaminophen (NORCO) valACYclovir (VALTREX)  Follow your surgeon's instructions on when to stop Aspirin.  If no instructions were given by your surgeon then you will need to call the office to get those instructions.     As of today, STOP taking any Aleve, Naproxen, Ibuprofen, Motrin, Advil, Goody's, BC's, all herbal medications, fish oil, and all vitamins.                      Do not wear jewelry, make up, or nail polish            Do not wear lotions, powders, perfumes, or deodorant.            Do not shave 48 hours prior to surgery.            Do not bring valuables to the hospital.            [facility] is not responsible for any belongings or valuables.  Do NOT Smoke (Tobacco/Vaping) or drink  Alcohol 24 hours prior to your procedure If you use a CPAP at night, you may bring all equipment for your overnight stay.   Contacts, glasses, dentures or bridgework may not be worn into surgery.      For patients admitted to the hospital, discharge time will be determined by your treatment team.   Patients discharged the day of surgery will not be allowed to drive home, and someone needs to stay with them for 24 hours.    Special instructions:   [facility]- Preparing For Surgery  Before surgery, you can play an important role. Because skin is not sterile, your skin needs to be as free of germs as possible. You can reduce the number of germs on your skin by washing with CHG (chlorahexidine gluconate) Soap before surgery.  CHG is an antiseptic cleaner which kills germs and bonds with the skin to continue killing germs even after washing.    Oral Hygiene is also important to reduce your risk of infection.  Remember - BRUSH YOUR TEETH THE MORNING OF SURGERY WITH YOUR REGULAR TOOTHPASTE  Please do not use if you have an allergy to CHG or antibacterial soaps. If your skin becomes reddened/irritated stop using the CHG.  Do not shave (including legs and underarms)  for at least 48 hours prior to first CHG shower. It is OK to shave your face.  Please follow these instructions carefully.   1. Shower the NIGHT BEFORE SURGERY and the MORNING OF SURGERY with CHG Soap.   2. If you chose to wash your hair, wash your hair first as usual with your normal shampoo.  3. After you shampoo, rinse your hair and body thoroughly to remove the shampoo.  4. Use CHG as you would any other liquid soap. You can apply CHG directly to the skin and wash gently with a scrungie or a clean washcloth.   5. Apply the CHG Soap to your body ONLY FROM THE NECK DOWN.  Do not use on open wounds or open sores. Avoid contact with your eyes, ears, mouth and genitals (private parts). Wash Face and genitals (private parts)  with  your normal soap.   6. Wash thoroughly, paying special attention to the area where your surgery will be performed.  7. Thoroughly rinse your body with warm water from the neck down.  8. DO NOT shower/wash with your normal soap after using and rinsing off the CHG Soap.  9. Pat yourself dry with a CLEAN TOWEL.  10. Wear CLEAN PAJAMAS to bed the night before surgery  11. Place CLEAN SHEETS on your bed the night of your first shower and DO NOT SLEEP WITH PETS.   Day of Surgery: Wear Clean/Comfortable clothing the morning of surgery Do not apply any deodorants/lotions.   Remember to brush your teeth WITH YOUR REGULAR TOOTHPASTE.   Please read over the following fact sheets that you were given.

## 2020-07-13 ENCOUNTER — Encounter (HOSPITAL_COMMUNITY): Payer: Self-pay

## 2020-07-13 ENCOUNTER — Other Ambulatory Visit: Payer: Self-pay

## 2020-07-13 ENCOUNTER — Encounter (HOSPITAL_COMMUNITY)
Admission: RE | Admit: 2020-07-13 | Discharge: 2020-07-13 | Disposition: A | Discharge status: Home / Self Care | Payer: PPO | Source: Ambulatory Visit | Attending: General Surgery | Admitting: General Surgery

## 2020-07-13 DIAGNOSIS — Z01812 Encounter for preprocedural laboratory examination: Secondary | ICD-10-CM | POA: Diagnosis not present

## 2020-07-13 HISTORY — DX: Essential (primary) hypertension: I10

## 2020-07-13 HISTORY — DX: Atherosclerotic heart disease of native coronary artery without angina pectoris: I25.10

## 2020-07-13 LAB — COMPREHENSIVE METABOLIC PANEL
ALT: 15 U/L (ref 0–44)
AST: 16 U/L (ref 15–41)
Albumin: 3.4 g/dL — ABNORMAL LOW (ref 3.5–5.0)
Alkaline Phosphatase: 64 U/L (ref 38–126)
Anion gap: 9 (ref 5–15)
BUN: 15 mg/dL (ref 8–23)
CO2: 25 mmol/L (ref 22–32)
Calcium: 9.2 mg/dL (ref 8.9–10.3)
Chloride: 104 mmol/L (ref 98–111)
Creatinine, Ser: 1.23 mg/dL — ABNORMAL HIGH (ref 0.44–1.00)
GFR calc Af Amer: 52 mL/min — ABNORMAL LOW (ref >60)
GFR calc non Af Amer: 45 mL/min — ABNORMAL LOW (ref >60)
Glucose, Bld: 98 mg/dL (ref 70–99)
Potassium: 4.6 mmol/L (ref 3.5–5.1)
Sodium: 138 mmol/L (ref 135–145)
Total Bilirubin: 0.8 mg/dL (ref 0.3–1.2)
Total Protein: 6.5 g/dL (ref 6.5–8.1)

## 2020-07-13 LAB — LIPASE, BLOOD: Lipase: 33 U/L (ref 11–51)

## 2020-07-13 LAB — CBC WITH DIFFERENTIAL/PLATELET
Abs Immature Granulocytes: 0.05 10*3/uL (ref 0.00–0.07)
Basophils Absolute: 0.1 10*3/uL (ref 0.0–0.1)
Basophils Relative: 1 %
Eosinophils Absolute: 0 10*3/uL (ref 0.0–0.5)
Eosinophils Relative: 0 %
HCT: 37.5 % (ref 36.0–46.0)
Hemoglobin: 12 g/dL (ref 12.0–15.0)
Immature Granulocytes: 1 %
Lymphocytes Relative: 24 %
Lymphs Abs: 2.7 10*3/uL (ref 0.7–4.0)
MCH: 33.2 pg (ref 26.0–34.0)
MCHC: 32 g/dL (ref 30.0–36.0)
MCV: 103.9 fL — ABNORMAL HIGH (ref 80.0–100.0)
Monocytes Absolute: 1 10*3/uL (ref 0.1–1.0)
Monocytes Relative: 9 %
Neutro Abs: 7.3 10*3/uL (ref 1.7–7.7)
Neutrophils Relative %: 65 %
Platelets: 333 10*3/uL (ref 150–400)
RBC: 3.61 MIL/uL — ABNORMAL LOW (ref 3.87–5.11)
RDW: 14.7 % (ref 11.5–15.5)
WBC: 11.1 10*3/uL — ABNORMAL HIGH (ref 4.0–10.5)
nRBC: 0 % (ref 0.0–0.2)

## 2020-07-13 NOTE — Progress Notes (Signed)
PCP - Dr. Sandi Mariscal Cardiologist - Dr. Shelva Majestic Neuro: Dr. Erline Levine GI: Dr. Owens Loffler  PPM/ICD - Denies  Chest x-ray - N/A EKG - 05/31/20 Stress Test - denies ECHO - 10/14/18 Cardiac Cath - 11/25/18  Sleep Study - Denies  Pt denies being diabetic.  Blood Thinner Instructions: N/A Aspirin Instructions: Pt instructed to stop 5 days prior to surgery. (07/16/20)  ERAS Protcol - Yes PRE-SURGERY Ensure  COVID TEST- 07/19/20   Anesthesia review: No  Patient denies shortness of breath, fever, cough and chest pain at PAT appointment   All instructions explained to the patient, with a verbal understanding of the material. Patient agrees to go over the instructions while at home for a better understanding. Patient also instructed to self quarantine after being tested for COVID-19. The opportunity to ask questions was provided.

## 2020-07-14 ENCOUNTER — Telehealth: Payer: Self-pay | Admitting: Pharmacist

## 2020-07-14 ENCOUNTER — Encounter (HOSPITAL_COMMUNITY): Payer: Self-pay

## 2020-07-14 NOTE — Telephone Encounter (Signed)
Blood pressure down to 164/62 after starting lisinopril 1 week ago. Note procedure scheduled for 07/21/2020.   Follow up appoinment scheduled with HTN clinic for 08/11/2020 to titrate lisinopril.

## 2020-07-14 NOTE — Progress Notes (Signed)
Anesthesia Chart Review:  Case: [id number] Date/Time: 07/21/20 1415   Procedure: LAPAROSCOPIC CHOLECYSTECTOMY WITH INTRAOPERATIVE CHOLANGIOGRAM (N/A )   Anesthesia type: General   Pre-op diagnosis: GALLSTONE , PANCREATITIS   Location: [facility] OR ROOM 02 / [facility]   Surgeons: [name], MD      DISCUSSION: Patient is a 69 year old female scheduled for the above procedure.  History includes former smoker (3 pack years, quit 2000), post-operative N/V, CAD (mild, non-obstructive 11/2018), fibromyalgia, ADHD, HTN, anxiety, depression, ACDF (2020), back surgery.  Last cardiology evaluation 05/31/20 by Dr. Claiborne Billings. She was doing okay except for HTN (180/100), in the setting of some of her meds being stopped during May 2021 admission for gallstone pancreatitis. He had her continue amlodipine and resume metoprolol. He arranged follow-up at the HTN Clinic. BP significantly elevated by 07/07/20 visit there. She felt visceral spasm worse since restarting metoprolol, so dose decreased and lisinopril added. One week BP follow-up recommended. BP 162/62 at 07/13/20 PAT visit. Dr. Claiborne Billings was aware she was being evaluated for future cholecystectomy.  Per surgeon, she is to hold aspirin 5 days prior to surgery.  Presurgical COVID-19 test is scheduled from 07/19/2020.  Anesthesia team to evaluate on the day of surgery.   VS: BP (!) 162/62   Pulse (!) 47   Temp 36.9 C (Oral)   Resp 17   Wt 71.9 kg   LMP  (LMP Unknown)   SpO2 100%   BMI 28.53 kg/m  She had HTN medication adjustments at 05/31/20 and 07/07/20 visits.   BP Readings from Last 3 Encounters:  07/13/20 (!) 162/62  07/07/20 (!) 200/98  05/31/20 (!) 180/100   Pulse Readings from Last 3 Encounters:  07/13/20 (!) 47  07/07/20 60  05/31/20 66        PROVIDERS: Sandi Mariscal, MD his PCP Shelva Majestic, MD is cardiologist. She is also followed at the HTN Clinic by Rodriguez-Guzman, Raquel, RPH-CPP. Owens Loffler, MD is GI    LABS: Labs reviewed: Acceptable for  surgery. (all labs ordered are listed, but only abnormal results are displayed)  Labs Reviewed  CBC WITH DIFFERENTIAL/PLATELET - Abnormal; Notable for the following components:      Result Value   WBC 11.1 (*)    RBC 3.61 (*)    MCV 103.9 (*)    All other components within normal limits  COMPREHENSIVE METABOLIC PANEL - Abnormal; Notable for the following components:   Creatinine, Ser 1.23 (*)    Albumin 3.4 (*)    GFR calc non Af Amer 45 (*)    GFR calc Af Amer 52 (*)    All other components within normal limits  LIPASE, BLOOD     IMAGES: CT abd/pelvis 04/06/20: IMPRESSION: - The changes seen on recent MRI of pancreatitis have improved significantly in the interval from the prior exam. The degree of biliary dilatation has improved as well. - Diverticulosis without diverticulitis. - Stable right breast nodule   EKG: 05/31/2020 Normal sinus rhythm Possible left atrial enlargement Nonspecific ST abnormality Abnormal ECG   CV: Cardiac cath 11/25/18:  Prox RCA lesion is 20% stenosed.  Mid RCA lesion is 20% stenosed.  Ost 1st Mrg lesion is 25% stenosed.  Prox LAD lesion is 25% stenosed.  Mid LAD lesion is 20% stenosed.  Dist LAD lesion is 20% stenosed.  Mid LM lesion is 5% stenosed. - Mild multi-vessel nonobstructive CAD with narrowings no greater than 25% in the LAD, circumflex marginal, and dominant  RCA. - LVEDP 10 mm Hg.  RECOMMENDATION: Medical therapy.  Aggressive lipid-lowering therapy with target LDL less than 70.  The patient's chest pain has atypical features and is most likely nonischemic.   CT Coronary/FFR 11/06/18: IMPRESSION: 1. The patient's coronary artery calcium score is 4, which places the patient in the 51 percentile. This is average for what is expected compared to age and gender matched peers. 2. Normal coronary origin with right dominance. 3. Severe CAD in the distal LAD, CADRADS = 4. Moderate CAD in the proximal RCA, mid LAD, and  proximal left circumflex artery. - CT FFR: 1. Left Main:  No significant stenosis. FFR = 0.98 2. LAD: No significant stenosis. Proximal FFR = 0.94, Mid FFR = 0.83, Distal FFR = 0.57 3. LCX: No significant stenosis. Proximal FFR = 0.87, Distal FFR = 0.81, OM1 = 0.83, OM2 = 0.82 4. RCA: No significant stenosis. Proximal FFR = 0.93, Mid FFR = 0.87,  Distal FFR = 0.85 IMPRESSION: 1. CT FFR analysis showed severe stenosis in the distal LAD. No other hemodynamically significant stenoses detected.   Echo 10/14/2018: Study Conclusions  - Left ventricle: Average global longitudinal LV strain is normal  at -18.7% The cavity size was normal. Systolic function was  normal. The estimated ejection fraction was in the range of 60%  to 65%. Wall motion was normal; there were no regional wall  motion abnormalities. The study is not technically sufficient to  allow evaluation of LV diastolic function.  - Aortic valve: Trileaflet; mildly thickened, mildly calcified  leaflets.  - Mitral valve: There was mild regurgitation.  - Atrial septum: There was increased thickness of the septum,  consistent with lipomatous hypertrophy.    Past Medical History:  Diagnosis Date  . ADHD   . Anxiety   . Back pain   . Chronic female pelvic pain   . Depression   . Fibromyalgia   . H/O leukocytosis   . Headache   . Hypertension   . MI (myocardial infarction) ([place])    Pt states she did not have a MI- EKG was normal, was GERD  . Osteoarthritis   . Ovarian cyst, right   . Post-operative nausea and vomiting   . SVD (spontaneous vaginal delivery)    x 2  . Vitamin D deficiency     Past Surgical History:  Procedure Laterality Date  . ABDOMINAL HYSTERECTOMY  1994   TAH.BSO  . ANTERIOR CERVICAL DECOMP/DISCECTOMY FUSION  2019  . APPENDECTOMY  1975  . BACK SURGERY    . BIOPSY  05/12/2020   Procedure: BIOPSY;  Surgeon: Milus Banister, MD;  Location: [facility];  Service: Endoscopy;;  .  CARDIAC CATHETERIZATION  2020  . COLONOSCOPY  08/12/2017   Hx polyp/Jacobs  . ESOPHAGOGASTRODUODENOSCOPY (EGD) WITH PROPOFOL N/A 05/12/2020   Procedure: ESOPHAGOGASTRODUODENOSCOPY (EGD) WITH PROPOFOL;  Surgeon: Milus Banister, MD;  Location: [facility];  Service: Endoscopy;  Laterality: N/A;  . EUS N/A 05/12/2020   Procedure: UPPER ENDOSCOPIC ULTRASOUND (EUS) RADIAL;  Surgeon: Milus Banister, MD;  Location: [facility] END[facility]COPY;  Service: Endoscopy;  Laterality: N/A;  . KNEE SURGERY Bilateral 1996   x 2 - arthroscopic  . LEFT HEART CATH AND CORONARY ANGIOGRAPHY N/A 11/25/2018   Procedure: LEFT HEART CATH AND CORONARY ANGIOGRAPHY;  Surgeon: Troy Sine, MD;  Location: [facility];  Service: Cardiovascular;  Laterality: N/A;  . PELVIC LAPAROSCOPY  1989   W LYSIS OF ADHESIONS/L SALPINGONEOSTOMY  . WISDOM TOOTH EXTRACTION      MEDICATIONS: . ALPRAZolam (  XANAX) 0.5 MG tablet  . amLODipine (NORVASC) 10 MG tablet  . Ascorbic Acid (VITAMIN C) 1000 MG tablet  . aspirin EC 81 MG tablet  . butalbital-acetaminophen-caffeine (FIORICET, ESGIC) 50-325-40 MG tablet  . Cholecalciferol (VITAMIN D) 50 MCG (2000 UT) tablet  . ezetimibe (ZETIA) 10 MG tablet  . ferrous sulfate 325 (65 FE) MG tablet  . HYDROcodone-acetaminophen (NORCO) 10-325 MG tablet  . lisinopril (ZESTRIL) 5 MG tablet  . metoprolol tartrate (LOPRESSOR) 25 MG tablet  . Multiple Vitamin (MULTIVITAMIN WITH MINERALS) TABS tablet  . omeprazole (PRILOSEC) 40 MG capsule  . ondansetron (ZOFRAN) 4 MG tablet  . valACYclovir (VALTREX) 500 MG tablet  . zolpidem (AMBIEN) 10 MG tablet   No current facility-administered medications for this encounter.    Myra Gianotti, PA-C Surgical Short Stay/Anesthesiology [facility] Phone [phone] [facility] Phone [phone] 07/14/2020 3:32 PM

## 2020-07-14 NOTE — Anesthesia Preprocedure Evaluation (Addendum)
Anesthesia Evaluation  Patient identified by MRN, date of birth, ID band Patient awake    Reviewed: Allergy & Precautions, NPO status , Patient's Chart, lab work & pertinent test results, reviewed documented beta blocker date and time   History of Anesthesia Complications (+) PONV and history of anesthetic complications  Airway Mallampati: II  TM Distance: >3 FB Neck ROM: Limited    Dental  (+) Dental Advisory Given, Teeth Intact   Pulmonary former smoker,    Pulmonary exam normal        Cardiovascular hypertension, Pt. on medications and Pt. on home beta blockers + CAD  Past MI: denies.  Normal cardiovascular exam   '20 Cath - Mild multi-vessel nonobstructive CAD with narrowings no greater than 25% in the LAD, circumflex marginal, and dominant  RCA.  '19 TTE - EF 65%. Mild MR. There was increased thickness of the atrial septum, consistent with lipomatous hypertrophy.    Neuro/Psych  Headaches, PSYCHIATRIC DISORDERS Anxiety Depression    GI/Hepatic negative GI ROS, Neg liver ROS,   Endo/Other  negative endocrine ROS  Renal/GU CRFRenal disease     Musculoskeletal  (+) Arthritis , Fibromyalgia -, narcotic dependent  Abdominal   Peds  (+) ADHD Hematology negative hematology ROS (+)   Anesthesia Other Findings Covid test negative   Reproductive/Obstetrics                           Anesthesia Physical Anesthesia Plan  ASA: III  Anesthesia Plan: General   Post-op Pain Management:    Induction: Intravenous  PONV Risk Score and Plan: 4 or greater and Treatment may vary due to age or medical condition, Ondansetron and TIVA  Airway Management Planned: Oral ETT  Additional Equipment: None  Intra-op Plan:   Post-operative Plan: Extubation in OR  Informed Consent: I have reviewed the patients History and Physical, chart, labs and discussed the procedure including the risks, benefits  and alternatives for the proposed anesthesia with the patient or authorized representative who has indicated his/her understanding and acceptance.     Dental advisory given  Plan Discussed with: CRNA and Anesthesiologist  Anesthesia Plan Comments:      Anesthesia Quick Evaluation

## 2020-07-19 ENCOUNTER — Other Ambulatory Visit (HOSPITAL_COMMUNITY)
Admission: RE | Admit: 2020-07-19 | Discharge: 2020-07-19 | Disposition: A | Discharge status: Home / Self Care | Payer: PPO | Source: Ambulatory Visit | Attending: General Surgery | Admitting: General Surgery

## 2020-07-19 DIAGNOSIS — Z01812 Encounter for preprocedural laboratory examination: Secondary | ICD-10-CM | POA: Diagnosis not present

## 2020-07-19 DIAGNOSIS — Z20822 Contact with and (suspected) exposure to covid-19: Secondary | ICD-10-CM | POA: Insufficient documentation

## 2020-07-19 LAB — SARS CORONAVIRUS 2 (TAT 6-24 HRS): SARS Coronavirus 2: NEGATIVE
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
## 2020-07-21 ENCOUNTER — Encounter (HOSPITAL_COMMUNITY)
Admission: RE | Disposition: A | Discharge status: Home / Self Care | Payer: Self-pay | Source: Home / Self Care | Attending: General Surgery

## 2020-07-21 ENCOUNTER — Ambulatory Visit (HOSPITAL_COMMUNITY): Payer: PPO | Admitting: Vascular Surgery

## 2020-07-21 ENCOUNTER — Encounter (HOSPITAL_COMMUNITY): Payer: Self-pay | Admitting: General Surgery

## 2020-07-21 ENCOUNTER — Other Ambulatory Visit: Payer: Self-pay

## 2020-07-21 ENCOUNTER — Ambulatory Visit (HOSPITAL_COMMUNITY): Payer: PPO

## 2020-07-21 ENCOUNTER — Ambulatory Visit (HOSPITAL_COMMUNITY)
Admission: RE | Admit: 2020-07-21 | Discharge: 2020-07-21 | Disposition: A | Discharge status: Home / Self Care | Payer: PPO | Attending: General Surgery | Admitting: General Surgery

## 2020-07-21 ENCOUNTER — Ambulatory Visit (HOSPITAL_COMMUNITY): Payer: PPO | Admitting: Certified Registered Nurse Anesthetist

## 2020-07-21 DIAGNOSIS — K851 Biliary acute pancreatitis without necrosis or infection: Secondary | ICD-10-CM | POA: Insufficient documentation

## 2020-07-21 DIAGNOSIS — Z87891 Personal history of nicotine dependence: Secondary | ICD-10-CM | POA: Insufficient documentation

## 2020-07-21 DIAGNOSIS — F419 Anxiety disorder, unspecified: Secondary | ICD-10-CM | POA: Diagnosis not present

## 2020-07-21 DIAGNOSIS — I129 Hypertensive chronic kidney disease with stage 1 through stage 4 chronic kidney disease, or unspecified chronic kidney disease: Secondary | ICD-10-CM | POA: Diagnosis not present

## 2020-07-21 DIAGNOSIS — Z7982 Long term (current) use of aspirin: Secondary | ICD-10-CM | POA: Insufficient documentation

## 2020-07-21 DIAGNOSIS — F1120 Opioid dependence, uncomplicated: Secondary | ICD-10-CM | POA: Diagnosis not present

## 2020-07-21 DIAGNOSIS — N189 Chronic kidney disease, unspecified: Secondary | ICD-10-CM | POA: Diagnosis not present

## 2020-07-21 DIAGNOSIS — N6310 Unspecified lump in the right breast, unspecified quadrant: Secondary | ICD-10-CM | POA: Diagnosis not present

## 2020-07-21 DIAGNOSIS — Z91040 Latex allergy status: Secondary | ICD-10-CM | POA: Insufficient documentation

## 2020-07-21 DIAGNOSIS — I700 Atherosclerosis of aorta: Secondary | ICD-10-CM | POA: Diagnosis not present

## 2020-07-21 DIAGNOSIS — Z820 Family history of epilepsy and other diseases of the nervous system: Secondary | ICD-10-CM | POA: Insufficient documentation

## 2020-07-21 DIAGNOSIS — K8010 Calculus of gallbladder with chronic cholecystitis without obstruction: Secondary | ICD-10-CM | POA: Diagnosis not present

## 2020-07-21 DIAGNOSIS — I252 Old myocardial infarction: Secondary | ICD-10-CM | POA: Insufficient documentation

## 2020-07-21 DIAGNOSIS — I10 Essential (primary) hypertension: Secondary | ICD-10-CM | POA: Diagnosis not present

## 2020-07-21 DIAGNOSIS — M549 Dorsalgia, unspecified: Secondary | ICD-10-CM | POA: Insufficient documentation

## 2020-07-21 DIAGNOSIS — Z419 Encounter for procedure for purposes other than remedying health state, unspecified: Secondary | ICD-10-CM

## 2020-07-21 DIAGNOSIS — R600 Localized edema: Secondary | ICD-10-CM | POA: Insufficient documentation

## 2020-07-21 DIAGNOSIS — Z79899 Other long term (current) drug therapy: Secondary | ICD-10-CM | POA: Insufficient documentation

## 2020-07-21 DIAGNOSIS — K915 Postcholecystectomy syndrome: Secondary | ICD-10-CM | POA: Diagnosis not present

## 2020-07-21 DIAGNOSIS — G43909 Migraine, unspecified, not intractable, without status migrainosus: Secondary | ICD-10-CM | POA: Insufficient documentation

## 2020-07-21 DIAGNOSIS — F909 Attention-deficit hyperactivity disorder, unspecified type: Secondary | ICD-10-CM | POA: Insufficient documentation

## 2020-07-21 DIAGNOSIS — R202 Paresthesia of skin: Secondary | ICD-10-CM | POA: Insufficient documentation

## 2020-07-21 DIAGNOSIS — R200 Anesthesia of skin: Secondary | ICD-10-CM | POA: Diagnosis not present

## 2020-07-21 DIAGNOSIS — R351 Nocturia: Secondary | ICD-10-CM | POA: Insufficient documentation

## 2020-07-21 DIAGNOSIS — Z86010 Personal history of colonic polyps: Secondary | ICD-10-CM | POA: Insufficient documentation

## 2020-07-21 DIAGNOSIS — K828 Other specified diseases of gallbladder: Secondary | ICD-10-CM | POA: Diagnosis not present

## 2020-07-21 DIAGNOSIS — I2510 Atherosclerotic heart disease of native coronary artery without angina pectoris: Secondary | ICD-10-CM | POA: Insufficient documentation

## 2020-07-21 DIAGNOSIS — M1990 Unspecified osteoarthritis, unspecified site: Secondary | ICD-10-CM | POA: Diagnosis not present

## 2020-07-21 DIAGNOSIS — Z8249 Family history of ischemic heart disease and other diseases of the circulatory system: Secondary | ICD-10-CM | POA: Insufficient documentation

## 2020-07-21 DIAGNOSIS — F329 Major depressive disorder, single episode, unspecified: Secondary | ICD-10-CM | POA: Diagnosis not present

## 2020-07-21 DIAGNOSIS — Z90710 Acquired absence of both cervix and uterus: Secondary | ICD-10-CM | POA: Insufficient documentation

## 2020-07-21 DIAGNOSIS — Z808 Family history of malignant neoplasm of other organs or systems: Secondary | ICD-10-CM | POA: Insufficient documentation

## 2020-07-21 DIAGNOSIS — M797 Fibromyalgia: Secondary | ICD-10-CM | POA: Diagnosis not present

## 2020-07-21 HISTORY — PX: CHOLECYSTECTOMY: SHX55

## 2020-07-21 IMAGING — RF DG CHOLANGIOGRAM OPERATIVE
1 series · 4 of 4 positions shown · non-contrast
Comparison: CT [DATE]

CLINICAL DATA: 69-year-old female with a history of laparoscopic
cholecystectomy

EXAM:
INTRAOPERATIVE CHOLANGIOGRAM
TECHNIQUE: Cholangiographic images from the C-arm fluoroscopic device were
submitted for interpretation post-operatively. Please see the
procedural report for the amount of contrast and the fluoroscopy
time utilized.

[Series 1: run · 4 of 146 frames shown]
[frame 22/146]
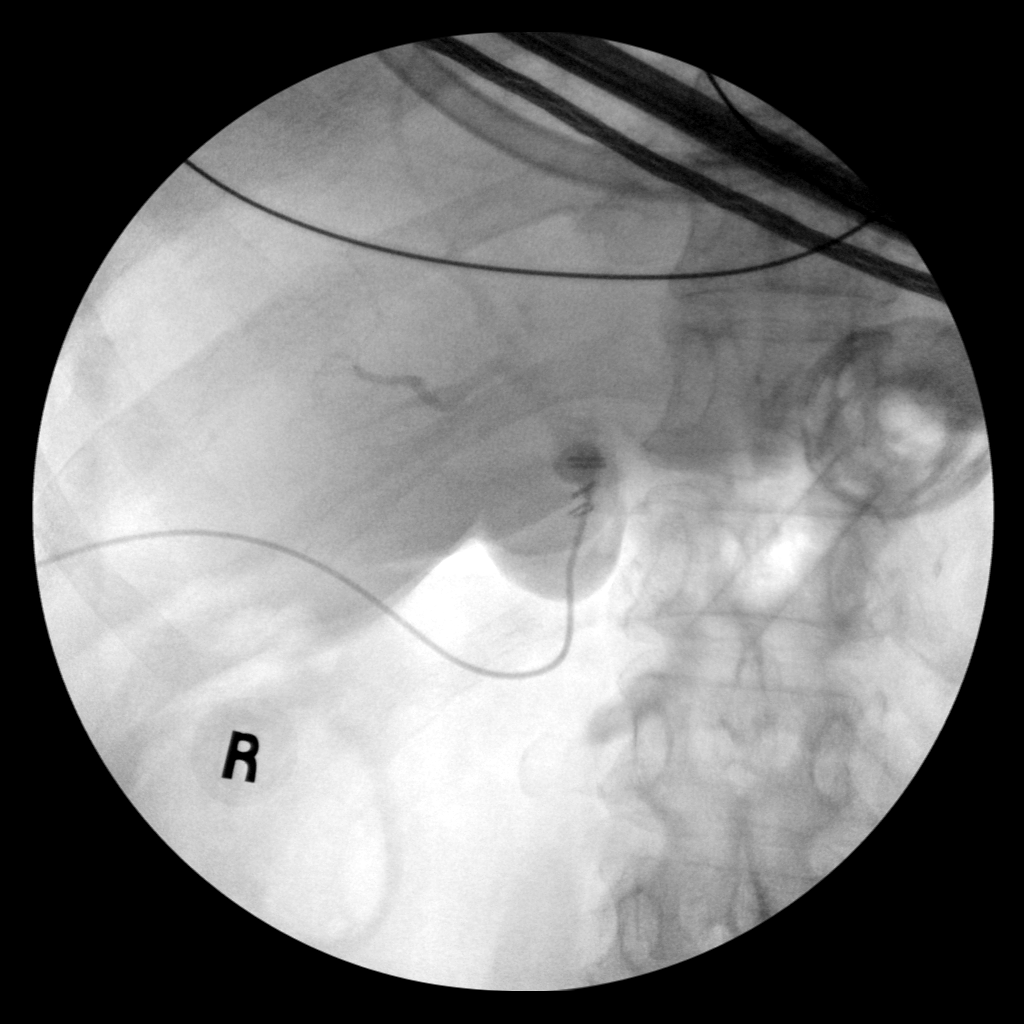
[frame 74/146]
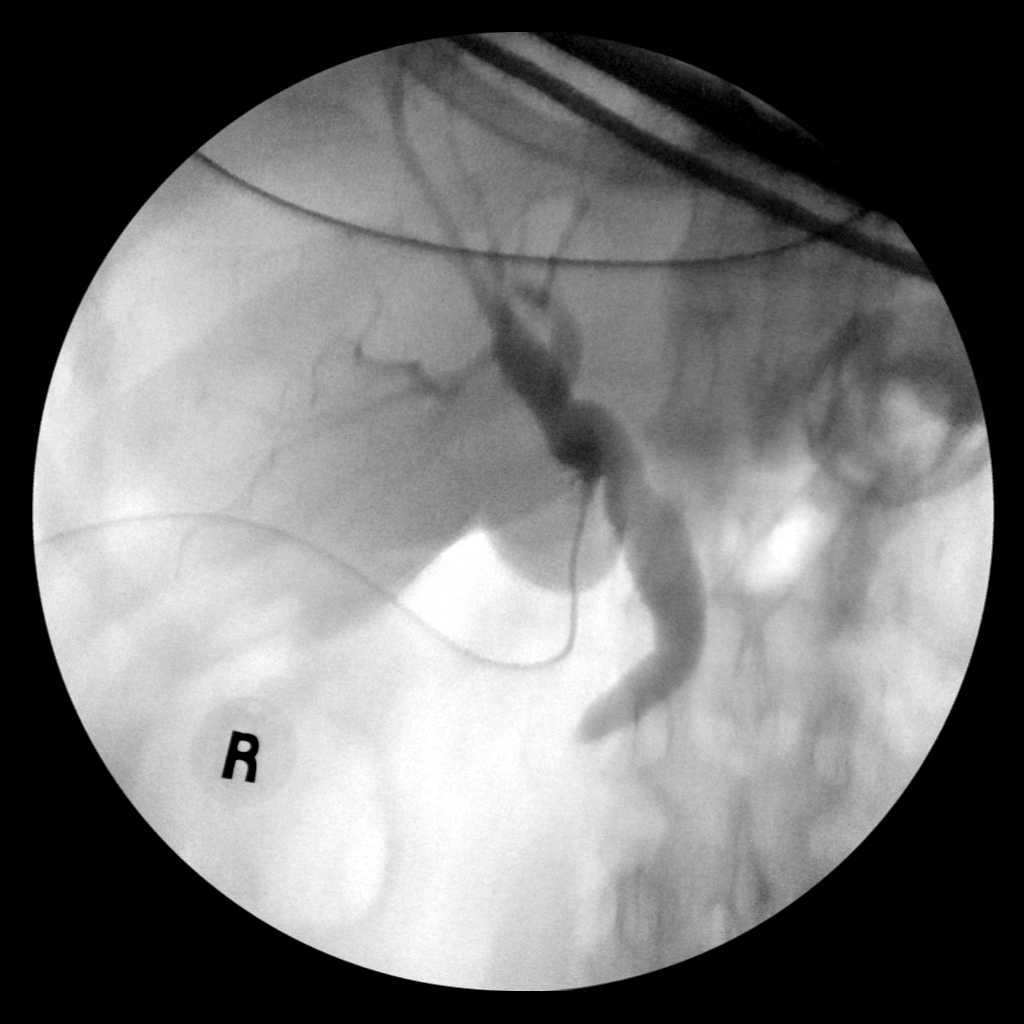
[frame 76/146]
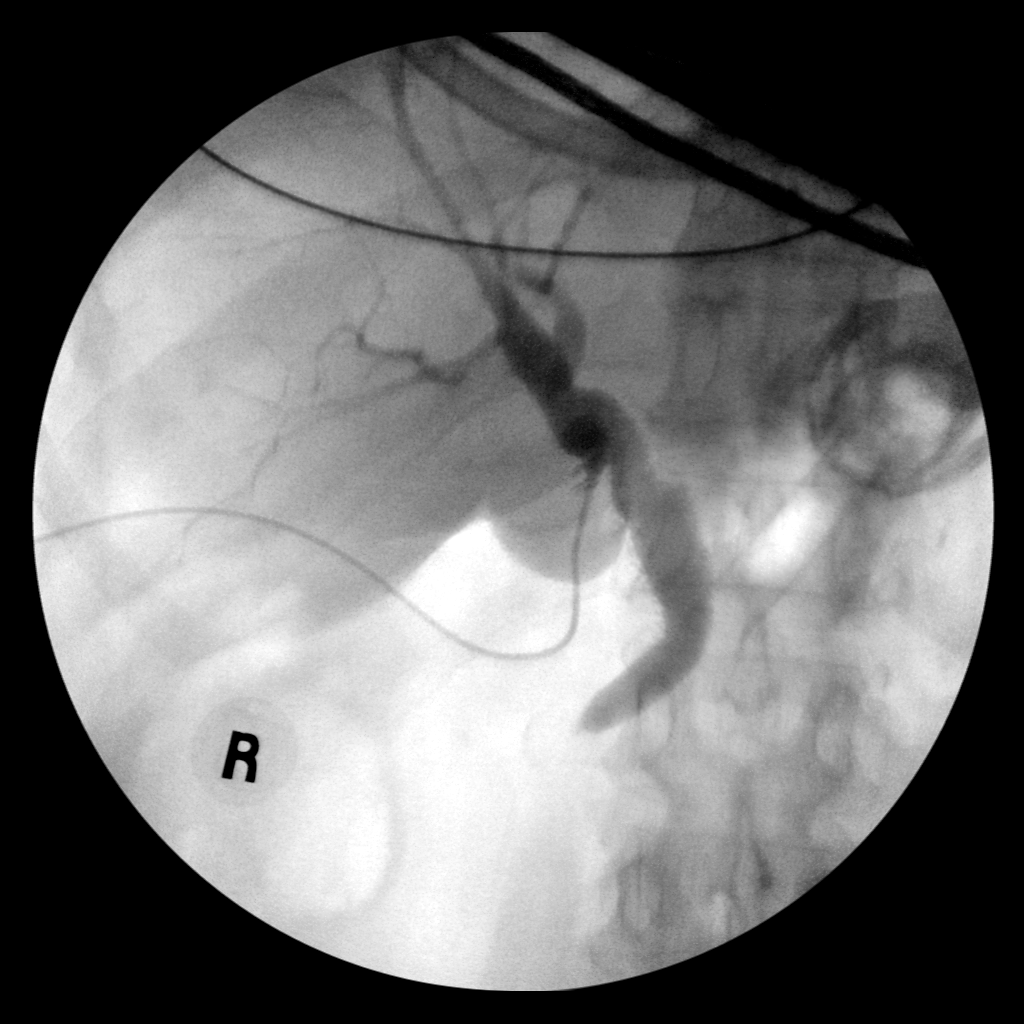
[frame 125/146]
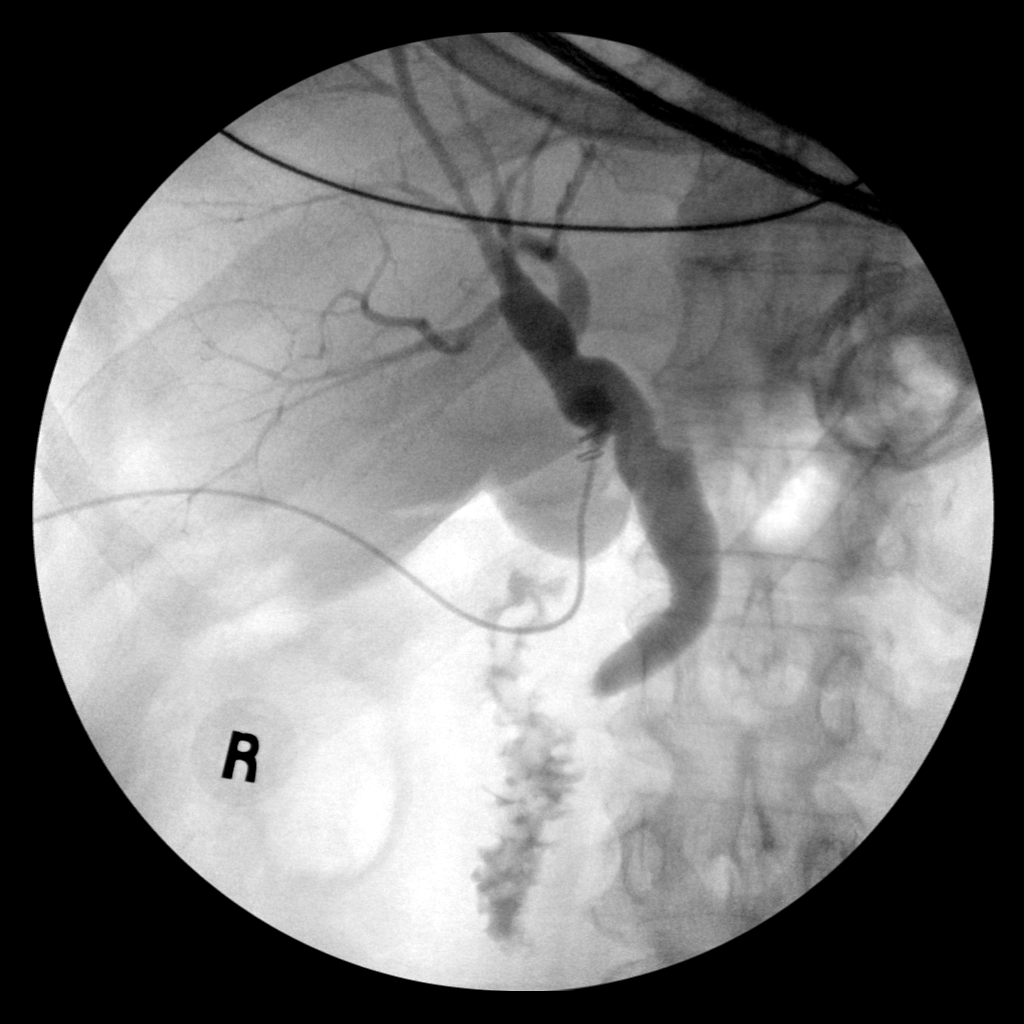

[4 of 4 positions shown; findings below may reference images not displayed]

FINDINGS: Surgical instruments project over the upper abdomen.

There is cannulation of the cystic duct/gallbladder neck, with
antegrade infusion of contrast. Caliber of the extrahepatic ductal
system somewhat dilated and compatible with prior CT

No definite filling defect within the extrahepatic ducts identified.

Free flow of contrast across the ampulla.
IMPRESSION: Intraoperative cholangiogram demonstrates extrahepatic biliary ducts
dilated and compatible with prior CT, with no definite filling
defects identified. Free flow of contrast across the ampulla.

Please refer to the dictated operative report for full details of
intraoperative findings and procedure

## 2020-07-21 SURGERY — LAPAROSCOPIC CHOLECYSTECTOMY WITH INTRAOPERATIVE CHOLANGIOGRAM
Anesthesia: General

## 2020-07-21 MED ORDER — MIDAZOLAM HCL 5 MG/5ML IJ SOLN
INTRAMUSCULAR | Status: DC | PRN
  Administered 2020-07-21: 2 mg via INTRAVENOUS

## 2020-07-21 MED ORDER — ACETAMINOPHEN 500 MG PO TABS
ORAL_TABLET | ORAL | Status: AC
  Administered 2020-07-21: 500 mg via ORAL
  Filled 2020-07-21: qty 2

## 2020-07-21 MED ORDER — DEXAMETHASONE SODIUM PHOSPHATE 10 MG/ML IJ SOLN
INTRAMUSCULAR | Status: AC
  Filled 2020-07-21: qty 1

## 2020-07-21 MED ORDER — FENTANYL CITRATE (PF) 100 MCG/2ML IJ SOLN
INTRAMUSCULAR | Status: AC
  Filled 2020-07-21: qty 2

## 2020-07-21 MED ORDER — HYDROMORPHONE HCL 1 MG/ML IJ SOLN
INTRAMUSCULAR | Status: DC | PRN
  Administered 2020-07-21: .5 mg via INTRAVENOUS

## 2020-07-21 MED ORDER — ACETAMINOPHEN 500 MG PO TABS: 1000.0000 mg | ORAL_TABLET | ORAL | Status: DC

## 2020-07-21 MED ORDER — ONDANSETRON HCL 4 MG/2ML IJ SOLN
INTRAMUSCULAR | Status: AC
  Filled 2020-07-21: qty 2

## 2020-07-21 MED ORDER — HYDROMORPHONE HCL 1 MG/ML IJ SOLN
INTRAMUSCULAR | Status: AC
  Filled 2020-07-21: qty 0.5

## 2020-07-21 MED ORDER — PROPOFOL 10 MG/ML IV BOLUS
INTRAVENOUS | Status: AC
  Filled 2020-07-21: qty 20

## 2020-07-21 MED ORDER — CHLORHEXIDINE GLUCONATE 0.12 % MT SOLN
15.0000 mL | OROMUCOSAL | Status: AC
  Filled 2020-07-21: qty 15

## 2020-07-21 MED ORDER — OXYCODONE HCL 5 MG PO TABS
5.0000 mg | ORAL_TABLET | Freq: Once | ORAL | Status: AC | PRN
  Administered 2020-07-21: 5 mg via ORAL

## 2020-07-21 MED ORDER — ACETAMINOPHEN 500 MG PO TABS
500.0000 mg | ORAL_TABLET | Freq: Once | ORAL | Status: AC
  Filled 2020-07-21: qty 1

## 2020-07-21 MED ORDER — 0.9 % SODIUM CHLORIDE (POUR BTL) OPTIME
TOPICAL | Status: DC | PRN
  Administered 2020-07-21: 1000 mL

## 2020-07-21 MED ORDER — CHLORHEXIDINE GLUCONATE 0.12 % MT SOLN
OROMUCOSAL | Status: AC
  Administered 2020-07-21: 15 mL via OROMUCOSAL
  Filled 2020-07-21: qty 15

## 2020-07-21 MED ORDER — PROPOFOL 500 MG/50ML IV EMUL
INTRAVENOUS | Status: DC | PRN
  Administered 2020-07-21: 150 ug/kg/min via INTRAVENOUS

## 2020-07-21 MED ORDER — PROMETHAZINE HCL 25 MG/ML IJ SOLN: 6.2500 mg | INTRAMUSCULAR | Status: DC | PRN

## 2020-07-21 MED ORDER — PHENYLEPHRINE HCL-NACL 10-0.9 MG/250ML-% IV SOLN
INTRAVENOUS | Status: DC | PRN
  Administered 2020-07-21: 15 ug/min via INTRAVENOUS

## 2020-07-21 MED ORDER — ROCURONIUM BROMIDE 10 MG/ML (PF) SYRINGE
PREFILLED_SYRINGE | INTRAVENOUS | Status: AC
  Filled 2020-07-21: qty 10

## 2020-07-21 MED ORDER — LIDOCAINE 2% (20 MG/ML) 5 ML SYRINGE
INTRAMUSCULAR | Status: AC
  Filled 2020-07-21: qty 5

## 2020-07-21 MED ORDER — HYDRALAZINE HCL 20 MG/ML IJ SOLN
10.0000 mg | Freq: Once | INTRAMUSCULAR | Status: AC
  Administered 2020-07-21: 10 mg via INTRAVENOUS

## 2020-07-21 MED ORDER — ONDANSETRON HCL 4 MG/2ML IJ SOLN
INTRAMUSCULAR | Status: DC | PRN
  Administered 2020-07-21 (×2): 4 mg via INTRAVENOUS

## 2020-07-21 MED ORDER — MIDAZOLAM HCL 2 MG/2ML IJ SOLN
INTRAMUSCULAR | Status: AC
  Filled 2020-07-21: qty 2

## 2020-07-21 MED ORDER — PROPOFOL 10 MG/ML IV BOLUS
INTRAVENOUS | Status: DC | PRN
  Administered 2020-07-21: 130 mg via INTRAVENOUS

## 2020-07-21 MED ORDER — CEFAZOLIN SODIUM-DEXTROSE 2-4 GM/100ML-% IV SOLN
2.0000 g | INTRAVENOUS | Status: AC
  Administered 2020-07-21: 2 g via INTRAVENOUS
  Filled 2020-07-21: qty 100

## 2020-07-21 MED ORDER — FENTANYL CITRATE (PF) 250 MCG/5ML IJ SOLN
INTRAMUSCULAR | Status: AC
  Filled 2020-07-21: qty 5

## 2020-07-21 MED ORDER — PROMETHAZINE HCL 25 MG/ML IJ SOLN: 12.5000 mg | Freq: Once | INTRAMUSCULAR | Status: DC | PRN

## 2020-07-21 MED ORDER — ROCURONIUM BROMIDE 10 MG/ML (PF) SYRINGE
PREFILLED_SYRINGE | INTRAVENOUS | Status: DC | PRN
  Administered 2020-07-21 (×2): 10 mg via INTRAVENOUS
  Administered 2020-07-21: 50 mg via INTRAVENOUS

## 2020-07-21 MED ORDER — LIDOCAINE HCL 1 % IJ SOLN
INTRAMUSCULAR | Status: DC | PRN
  Administered 2020-07-21: 10 mL

## 2020-07-21 MED ORDER — FENTANYL CITRATE (PF) 250 MCG/5ML IJ SOLN
INTRAMUSCULAR | Status: DC | PRN
Start: 2020-07-21 — End: 2020-07-21
  Administered 2020-07-21 (×2): 50 ug via INTRAVENOUS
  Administered 2020-07-21: 100 ug via INTRAVENOUS
  Administered 2020-07-21: 50 ug via INTRAVENOUS

## 2020-07-21 MED ORDER — SODIUM CHLORIDE 0.9 % IR SOLN
Status: DC | PRN
  Administered 2020-07-21: 1000 mL

## 2020-07-21 MED ORDER — SODIUM CHLORIDE 0.9 % IV SOLN
INTRAVENOUS | Status: DC | PRN
  Administered 2020-07-21: 15 mL

## 2020-07-21 MED ORDER — FENTANYL CITRATE (PF) 100 MCG/2ML IJ SOLN
25.0000 ug | INTRAMUSCULAR | Status: DC | PRN
  Administered 2020-07-21: 50 ug via INTRAVENOUS
  Administered 2020-07-21 (×4): 25 ug via INTRAVENOUS

## 2020-07-21 MED ORDER — CHLORHEXIDINE GLUCONATE CLOTH 2 % EX PADS: 6.0000 | MEDICATED_PAD | Freq: Once | CUTANEOUS | Status: DC

## 2020-07-21 MED ORDER — FENTANYL CITRATE (PF) 100 MCG/2ML IJ SOLN
INTRAMUSCULAR | Status: DC
Start: 2020-07-21 — End: 2020-07-21
  Filled 2020-07-21: qty 2

## 2020-07-21 MED ORDER — OXYCODONE HCL 5 MG PO TABS: 5.0000 mg | ORAL_TABLET | Freq: Four times a day (QID) | ORAL | 0 refills | Status: DC | PRN

## 2020-07-21 MED ORDER — LIDOCAINE 2% (20 MG/ML) 5 ML SYRINGE
INTRAMUSCULAR | Status: DC | PRN
  Administered 2020-07-21: 60 mg via INTRAVENOUS

## 2020-07-21 MED ORDER — ENSURE PRE-SURGERY PO LIQD: 296.0000 mL | Freq: Once | ORAL | Status: DC

## 2020-07-21 MED ORDER — LACTATED RINGERS IV SOLN: INTRAVENOUS | Status: DC

## 2020-07-21 MED ORDER — HYDRALAZINE HCL 20 MG/ML IJ SOLN
INTRAMUSCULAR | Status: AC
  Filled 2020-07-21: qty 1

## 2020-07-21 MED ORDER — OXYCODONE HCL 5 MG/5ML PO SOLN: 5.0000 mg | Freq: Once | ORAL | Status: AC | PRN

## 2020-07-21 MED ORDER — SUGAMMADEX SODIUM 200 MG/2ML IV SOLN
INTRAVENOUS | Status: DC | PRN
  Administered 2020-07-21: 200 mg via INTRAVENOUS

## 2020-07-21 MED ORDER — OXYCODONE HCL 5 MG PO TABS
ORAL_TABLET | ORAL | Status: DC
Start: 2020-07-21 — End: 2020-07-21
  Filled 2020-07-21: qty 1

## 2020-07-21 MED ORDER — PROMETHAZINE HCL 12.5 MG PO TABS: 12.5000 mg | ORAL_TABLET | Freq: Four times a day (QID) | ORAL | 0 refills | Status: DC | PRN

## 2020-07-21 SURGICAL SUPPLY — 48 items
ADH SKN CLS APL DERMABOND .7 (GAUZE/BANDAGES/DRESSINGS) ×1
APL PRP STRL LF DISP 70% ISPRP (MISCELLANEOUS) ×1
APPLIER CLIP ROT 10 11.4 M/L (STAPLE) ×3
APR CLP MED LRG 11.4X10 (STAPLE) ×1
BAG SPEC RTRVL 10 TROC 200 (ENDOMECHANICALS) ×1
BLADE CLIPPER SURG (BLADE) IMPLANT
CANISTER SUCT 3000ML PPV (MISCELLANEOUS) ×3 IMPLANT
CHLORAPREP W/TINT 26 (MISCELLANEOUS) ×3 IMPLANT
CLIP APPLIE ROT 10 11.4 M/L (STAPLE) ×1 IMPLANT
CLIP VESOLOCK XL 6/CT (CLIP) IMPLANT
COVER MAYO STAND STRL (DRAPES) ×3 IMPLANT
COVER SURGICAL LIGHT HANDLE (MISCELLANEOUS) ×3 IMPLANT
COVER WAND RF STERILE (DRAPES) ×3 IMPLANT
DERMABOND ADVANCED (GAUZE/BANDAGES/DRESSINGS) ×2
DERMABOND ADVANCED .7 DNX12 (GAUZE/BANDAGES/DRESSINGS) ×1 IMPLANT
DRAPE C-ARM 42X120 X-RAY (DRAPES) ×3 IMPLANT
DRAPE WARM FLUID 44X44 (DRAPES) ×3 IMPLANT
ELECT REM PT RETURN 9FT ADLT (ELECTROSURGICAL) ×3
ELECTRODE REM PT RTRN 9FT ADLT (ELECTROSURGICAL) ×1 IMPLANT
FILTER SMOKE EVAC LAPAROSHD (FILTER) IMPLANT
GLOVE BIO SURGEON STRL SZ 6 (GLOVE) ×3 IMPLANT
GLOVE INDICATOR 6.5 STRL GRN (GLOVE) ×3 IMPLANT
GOWN STRL REUS W/ TWL LRG LVL3 (GOWN DISPOSABLE) ×2 IMPLANT
GOWN STRL REUS W/TWL 2XL LVL3 (GOWN DISPOSABLE) ×3 IMPLANT
GOWN STRL REUS W/TWL LRG LVL3 (GOWN DISPOSABLE) ×6
KIT BASIN OR (CUSTOM PROCEDURE TRAY) ×3 IMPLANT
KIT TURNOVER KIT B (KITS) ×3 IMPLANT
L-HOOK LAP DISP 36CM (ELECTROSURGICAL) ×3
LHOOK LAP DISP 36CM (ELECTROSURGICAL) ×1 IMPLANT
NS IRRIG 1000ML POUR BTL (IV SOLUTION) ×3 IMPLANT
PAD ARMBOARD 7.5X6 YLW CONV (MISCELLANEOUS) ×3 IMPLANT
PENCIL BUTTON HOLSTER BLD 10FT (ELECTRODE) ×3 IMPLANT
POUCH RETRIEVAL ECOSAC 10 (ENDOMECHANICALS) ×1 IMPLANT
POUCH RETRIEVAL ECOSAC 10MM (ENDOMECHANICALS) ×3
SCISSORS LAP 5X35 DISP (ENDOMECHANICALS) ×3 IMPLANT
SET CHOLANGIOGRAPH 5 50 .035 (SET/KITS/TRAYS/PACK) ×3 IMPLANT
SET IRRIG TUBING LAPAROSCOPIC (IRRIGATION / IRRIGATOR) ×3 IMPLANT
SET TUBE SMOKE EVAC HIGH FLOW (TUBING) ×3 IMPLANT
SLEEVE ENDOPATH XCEL 5M (ENDOMECHANICALS) ×3 IMPLANT
SPECIMEN JAR SMALL (MISCELLANEOUS) ×3 IMPLANT
SUT MNCRL AB 4-0 PS2 18 (SUTURE) ×5 IMPLANT
TOWEL GREEN STERILE (TOWEL DISPOSABLE) ×3 IMPLANT
TOWEL GREEN STERILE FF (TOWEL DISPOSABLE) ×3 IMPLANT
TRAY LAPAROSCOPIC MC (CUSTOM PROCEDURE TRAY) ×3 IMPLANT
TROCAR XCEL BLUNT TIP 100MML (ENDOMECHANICALS) ×3 IMPLANT
TROCAR XCEL NON-BLD 11X100MML (ENDOMECHANICALS) ×3 IMPLANT
TROCAR XCEL NON-BLD 5MMX100MML (ENDOMECHANICALS) ×3 IMPLANT
WATER STERILE IRR 1000ML POUR (IV SOLUTION) ×3 IMPLANT

## 2020-07-21 NOTE — Op Note (Signed)
Laparoscopic Cholecystectomy with IOC Procedure Note  Indications: This patient presents with history of gallstone pancreatitis and chronic cholecystitis and will undergo laparoscopic cholecystectomy.  Pre-operative Diagnosis: gallstone pancreatitis and chronic calculous cholecystitis  Post-operative Diagnosis: Same  Surgeon: [name]   Assistants: Daiva Huge, MD, PGY 7  Anesthesia: General endotracheal anesthesia and local  ASA Class: 3  Procedure Details  The patient was seen again in the Holding Room. The risks, benefits, complications, treatment options, and expected outcomes were discussed with the patient. The possibilities of  bleeding, recurrent infection, damage to nearby structures, the need for additional procedures, failure to diagnose a condition, the possible need to convert to an open procedure, and creating a complication requiring transfusion or operation were discussed with the patient. The likelihood of improving the patient's symptoms with return to their baseline status is good.    The patient and/or family concurred with the proposed plan, giving informed consent. The site of surgery properly noted. The patient was taken to Operating Room, and the procedure verified as Laparoscopic Cholecystectomy with Intraoperative Cholangiogram. A Time Out was held and the above information confirmed.  Prior to the induction of general anesthesia, antibiotic prophylaxis was administered. General endotracheal anesthesia was then administered and tolerated well. After the induction, the abdomen was prepped with Chloraprep and draped in the sterile fashion. The patient was positioned in the supine position.  Local anesthetic agent was injected into the skin near the umbilicus and an incision made. We dissected down to the abdominal fascia with blunt dissection.  The fascia was incised vertically and we entered the peritoneal cavity bluntly.  A pursestring suture of 0-Vicryl was  placed around the fascial opening.  The Hasson cannula was inserted and secured with the stay suture.  Pneumoperitoneum was then created with CO2 and tolerated well without any adverse changes in the patient's vital signs. An 11-mm port was placed in the subxiphoid position.  Two 5-mm ports were placed in the right upper quadrant. All skin incisions were infiltrated with a local anesthetic agent before making the incision and placing the trocars.   We positioned the patient in reverse Trendelenburg, tilted slightly to the patient's left.  The gallbladder was identified, the fundus grasped and retracted cephalad. Adhesions were lysed bluntly and with the electrocautery where indicated, taking care not to injure any adjacent organs or viscus. The infundibulum was grasped and retracted laterally, exposing the peritoneum overlying the triangle of Calot. This was then divided and exposed in a blunt fashion. A critical view of the cystic duct and cystic artery was obtained.  The cystic duct was clearly identified and bluntly dissected circumferentially. The cystic duct was ligated with a clip distally.   An incision was made in the cystic duct and the [facility] cholangiogram catheter introduced. The catheter was secured using a clip. A cholangiogram was then performed, demonstrating good filling of left and right hepatic duct, common duct, and duodenum without evidence of filling defect.  The cystic duct was then ligated with clips and divided. The cystic artery was identified, dissected free, ligated with clips and divided as well.   The gallbladder was dissected from the liver bed in retrograde fashion with the electrocautery. The gallbladder was removed and placed in an Ecosac.  The gallbladder and Ecosac were then removed through the umbilical port site.  The liver bed was irrigated and inspected. Hemostasis was achieved with the electrocautery. Copious irrigation was utilized and was repeatedly aspirated until  clear.    We again inspected  the right upper quadrant for hemostasis.  Pneumoperitoneum was released as we removed the trocars.   The pursestring suture was used to close the umbilical fascia.  4-0 Monocryl was used to close the skin.   The skin was cleaned and dry, and Dermabond was applied. The patient was then extubated and brought to the recovery room in stable condition. Instrument, sponge, and needle counts were correct at closure and at the conclusion of the case.   Findings: Significant chronic adhesions.  Normal cholangiogram.    Estimated Blood Loss: min         Drains: none          Specimens: Gallbladder to pathology       Complications: None; patient tolerated the procedure well.         Disposition: PACU - hemodynamically stable.         Condition: stable

## 2020-07-21 NOTE — Discharge Instructions (Addendum)
General Anesthesia, Adult, Care After This sheet gives you information about how to care for yourself after your procedure. Your health care provider may also give you more specific instructions. If you have problems or questions, contact your health care provider. What can I expect after the procedure? After the procedure, the following side effects are common:  Pain or discomfort at the IV site.  Nausea.  Vomiting.  Sore throat.  Trouble concentrating.  Feeling cold or chills.  Weak or tired.  Sleepiness and fatigue.  Soreness and body aches. These side effects can affect parts of the body that were not involved in surgery. Follow these instructions at home:  For at least 24 hours after the procedure:  Have a responsible adult stay with you. It is important to have someone help care for you until you are awake and alert.  Rest as needed.  Do not: ? Participate in activities in which you could fall or become injured. ? Drive. ? Use heavy machinery. ? Drink alcohol. ? Take sleeping pills or medicines that cause drowsiness. ? Make important decisions or sign legal documents. ? Take care of children on your own. Eating and drinking  Follow any instructions from your health care provider about eating or drinking restrictions.  When you feel hungry, start by eating small amounts of foods that are soft and easy to digest (bland), such as toast. Gradually return to your regular diet.  Drink enough fluid to keep your urine pale yellow.  If you vomit, rehydrate by drinking water, juice, or clear broth. General instructions  If you have sleep apnea, surgery and certain medicines can increase your risk for breathing problems. Follow instructions from your health care provider about wearing your sleep device: ? Anytime you are sleeping, including during daytime naps. ? While taking prescription pain medicines, sleeping medicines, or medicines that make you drowsy.  Return to  your normal activities as told by your health care provider. Ask your health care provider what activities are safe for you.  Take over-the-counter and prescription medicines only as told by your health care provider.  If you smoke, do not smoke without supervision.  Keep all follow-up visits as told by your health care provider. This is important. Contact a health care provider if:  You have nausea or vomiting that does not get better with medicine.  You cannot eat or drink without vomiting.  You have pain that does not get better with medicine.  You are unable to pass urine.  You develop a skin rash.  You have a fever.  You have redness around your IV site that gets worse. Get help right away if:  You have difficulty breathing.  You have chest pain.  You have blood in your urine or stool, or you vomit blood. Summary  After the procedure, it is common to have a sore throat or nausea. It is also common to feel tired.  Have a responsible adult stay with you for the first 24 hours after general anesthesia. It is important to have someone help care for you until you are awake and alert.  When you feel hungry, start by eating small amounts of foods that are soft and easy to digest (bland), such as toast. Gradually return to your regular diet.  Drink enough fluid to keep your urine pale yellow.  Return to your normal activities as told by your health care provider. Ask your health care provider what activities are safe for you. This information is not   intended to replace advice given to you by your health care provider. Make sure you discuss any questions you have with your health care provider. Document Revised: 11/01/2017 Document Reviewed: 06/14/2017 Elsevier Patient Education  2020 [place] Office Phone Number [phone]   POST OP INSTRUCTIONS  Always review your discharge instruction sheet given to you by the facility where your  surgery was performed.  IF YOU HAVE DISABILITY OR FAMILY LEAVE FORMS, YOU MUST BRING THEM TO THE OFFICE FOR PROCESSING.  DO NOT GIVE THEM TO YOUR DOCTOR.  1. A prescription for pain medication may be given to you upon discharge.  Take your pain medication as prescribed, if needed.  If narcotic pain medicine is not needed, then you may take acetaminophen (Tylenol) or ibuprofen (Advil) as needed. 2. Take your usually prescribed medications unless otherwise directed 3. If you need a refill on your pain medication, please contact your pharmacy.  They will contact our office to request authorization.  Prescriptions will not be filled after 5pm or on week-ends. 4. You should eat very light the first 24 hours after surgery, such as soup, crackers, pudding, etc.  Resume your normal diet the day after surgery 5. It is common to experience some constipation if taking pain medication after surgery.  Increasing fluid intake and taking a stool softener will usually help or prevent this problem from occurring.  A mild laxative (Milk of Magnesia or Miralax) should be taken according to package directions if there are no bowel movements after 48 hours. 6. You may shower in 48 hours.  The surgical glue will flake off in 2-3 weeks.   7. ACTIVITIES:  No strenuous activity or heavy lifting for 1 week.   a. You may drive when you no longer are taking prescription pain medication, you can comfortably wear a seatbelt, and you can safely maneuver your car and apply brakes. b. RETURN TO WORK:  __________2 weeks or as tolerated as long as no lifting.  _______________ Dennis Bast should see your doctor in the office for a follow-up appointment approximately three-four weeks after your surgery.    WHEN TO CALL YOUR DOCTOR: 1. Fever over 101.0 2. Nausea and/or vomiting. 3. Extreme swelling or bruising. 4. Continued bleeding from incision. 5. Increased pain, redness, or drainage from the incision.  The clinic staff is available to  answer your questions during regular business hours.  Please don't hesitate to call and ask to speak to one of the nurses for clinical concerns.  If you have a medical emergency, go to the nearest emergency room or call 911.  A surgeon from [facility] is always on call at the hospital.  For further questions, please visit [email or web address]

## 2020-07-21 NOTE — Anesthesia Procedure Notes (Signed)
Procedure Name: Intubation Date/Time: 07/21/2020 10:16 AM Performed by: Colin Benton, CRNA Pre-anesthesia Checklist: Patient identified, Emergency Drugs available, Suction available and Patient being monitored Patient Re-evaluated:Patient Re-evaluated prior to induction Oxygen Delivery Method: Circle system utilized Preoxygenation: Pre-oxygenation with 100% oxygen Induction Type: IV induction Ventilation: Mask ventilation without difficulty Laryngoscope Size: Miller and 2 Grade View: Grade I Tube type: Oral Tube size: 7.0 mm Number of attempts: 1 Airway Equipment and Method: Stylet Placement Confirmation: ETT inserted through vocal cords under direct vision,  positive ETCO2 and breath sounds checked- equal and bilateral Secured at: 22 cm Tube secured with: Tape Dental Injury: Teeth and Oropharynx as per pre-operative assessment

## 2020-07-21 NOTE — Transfer of Care (Signed)
Immediate Anesthesia Transfer of Care Note  Patient: Robin Arellano  Procedure(s) Performed: LAPAROSCOPIC CHOLECYSTECTOMY WITH INTRAOPERATIVE CHOLANGIOGRAM (N/A )  Patient Location: PACU  Anesthesia Type:General  Level of Consciousness: awake, alert , oriented and patient cooperative  Airway & Oxygen Therapy: Patient Spontanous Breathing and Patient connected to nasal cannula oxygen  Post-op Assessment: Report given to RN, Post -op Vital signs reviewed and stable and Patient moving all extremities X 4  Post vital signs: Reviewed and stable  Last Vitals:  Vitals Value Taken Time  BP 178/69 07/21/20 1200  Temp    Pulse 64 07/21/20 1201  Resp 22 07/21/20 1201  SpO2 98 % 07/21/20 1201  Vitals shown include unvalidated device data.  Last Pain:  Vitals:   07/21/20 0810  PainSc: 3          Complications: No complications documented.

## 2020-07-21 NOTE — Interval H&P Note (Signed)
History and Physical Interval Note:  07/21/2020 9:48 AM  Robin Arellano  has presented today for surgery, with the diagnosis of GALLSTONE , PANCREATITIS.  The various methods of treatment have been discussed with the patient and family. After consideration of risks, benefits and other options for treatment, the patient has consented to  Procedure(s): LAPAROSCOPIC CHOLECYSTECTOMY WITH INTRAOPERATIVE CHOLANGIOGRAM (N/A) as a surgical intervention.  The patient's history has been reviewed, patient examined, no change in status, stable for surgery.  I have reviewed the patient's chart and labs.  Questions were answered to the patient's satisfaction.     [name]

## 2020-07-21 NOTE — Anesthesia Postprocedure Evaluation (Signed)
Anesthesia Post Note  Patient: Robin Arellano  Procedure(s) Performed: LAPAROSCOPIC CHOLECYSTECTOMY WITH INTRAOPERATIVE CHOLANGIOGRAM (N/A )     Patient location during evaluation: PACU Anesthesia Type: General Level of consciousness: awake and alert Pain management: pain level controlled Vital Signs Assessment: post-procedure vital signs reviewed and stable Respiratory status: spontaneous breathing, nonlabored ventilation and respiratory function stable Cardiovascular status: stable Postop Assessment: no apparent nausea or vomiting Anesthetic complications: no   No complications documented.  Last Vitals:  Vitals:   07/21/20 1254 07/21/20 1308  BP: (!) 195/79 (!) 186/81  Pulse: 65 65  Resp: 13 19  Temp:    SpO2: 98% 99%                   Audry Pili

## 2020-07-22 ENCOUNTER — Encounter (HOSPITAL_COMMUNITY): Payer: Self-pay | Admitting: General Surgery

## 2020-07-22 LAB — SURGICAL PATHOLOGY

## 2020-08-11 ENCOUNTER — Ambulatory Visit: Payer: PPO

## 2020-08-15 DIAGNOSIS — M5459 Other low back pain: Secondary | ICD-10-CM | POA: Diagnosis not present

## 2020-08-15 DIAGNOSIS — G8929 Other chronic pain: Secondary | ICD-10-CM | POA: Diagnosis not present

## 2020-08-16 ENCOUNTER — Other Ambulatory Visit: Payer: Self-pay | Admitting: General Surgery

## 2020-08-16 DIAGNOSIS — K851 Biliary acute pancreatitis without necrosis or infection: Secondary | ICD-10-CM

## 2020-08-22 DIAGNOSIS — K851 Biliary acute pancreatitis without necrosis or infection: Secondary | ICD-10-CM | POA: Diagnosis not present

## 2020-08-30 ENCOUNTER — Ambulatory Visit
Admission: RE | Admit: 2020-08-30 | Discharge: 2020-08-30 | Disposition: A | Discharge status: Home / Self Care | Payer: PPO | Source: Ambulatory Visit | Attending: General Surgery | Admitting: General Surgery

## 2020-08-30 DIAGNOSIS — N261 Atrophy of kidney (terminal): Secondary | ICD-10-CM | POA: Diagnosis not present

## 2020-08-30 DIAGNOSIS — I700 Atherosclerosis of aorta: Secondary | ICD-10-CM | POA: Diagnosis not present

## 2020-08-30 DIAGNOSIS — Z90710 Acquired absence of both cervix and uterus: Secondary | ICD-10-CM | POA: Diagnosis not present

## 2020-08-30 DIAGNOSIS — K851 Biliary acute pancreatitis without necrosis or infection: Secondary | ICD-10-CM

## 2020-08-30 DIAGNOSIS — K5730 Diverticulosis of large intestine without perforation or abscess without bleeding: Secondary | ICD-10-CM | POA: Diagnosis not present

## 2020-08-30 IMAGING — CT CT ABD-PELV W/ CM
1 of 3 series · 14 of 32 positions shown, 19 images · IV contrast (APPLIED)
Comparison: CT abdomen pelvis dated [DATE].

CLINICAL DATA: 69-year-old female with left lower quadrant
abdominal pain.

EXAM:
CT ABDOMEN AND PELVIS WITH CONTRAST
TECHNIQUE: Multidetector CT imaging of the abdomen and pelvis was performed
using the standard protocol following bolus administration of
intravenous contrast.
CONTRAST:  80mL [21] IOPAMIDOL ([21]) INJECTION 61%

[Series 2: abd/pelvis w/cm · axial · 0.74mm/px · z∈[-466,-61]mm · 14 of 93 slices shown, 19 images]
[im 6/93  soft-tissue]
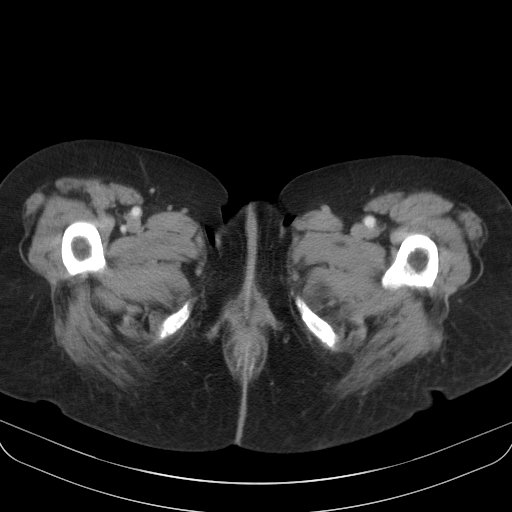
[im 6/93  bone]
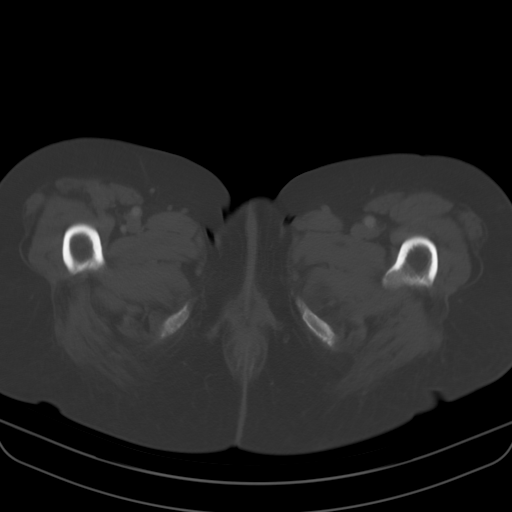
[im 12/93  soft-tissue]
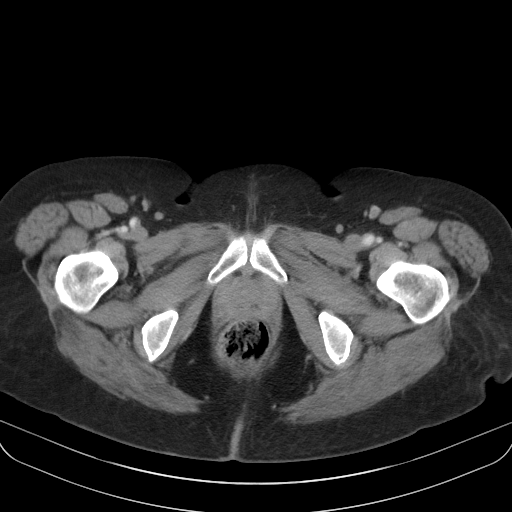
[im 18/93  soft-tissue]
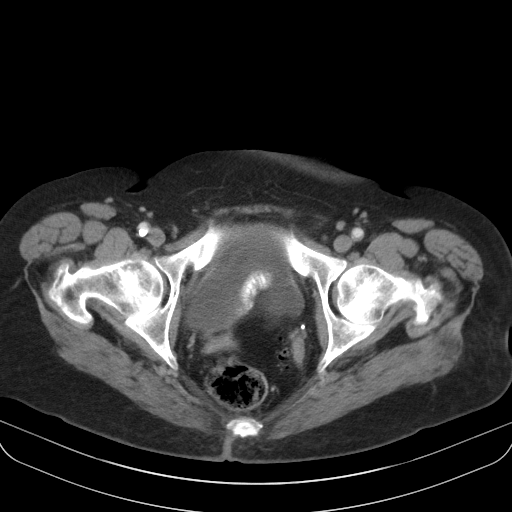
[im 29/93  soft-tissue]
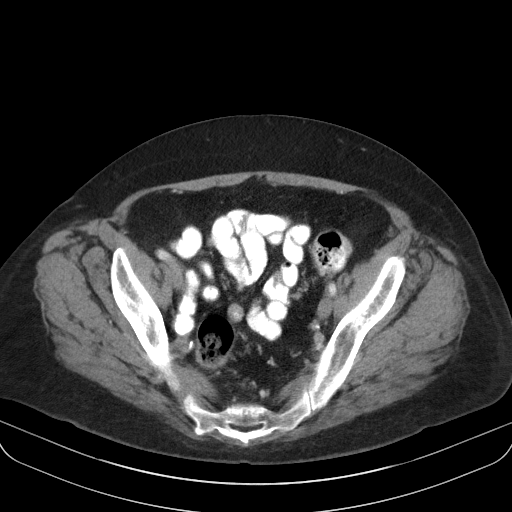
[im 35/93  soft-tissue]
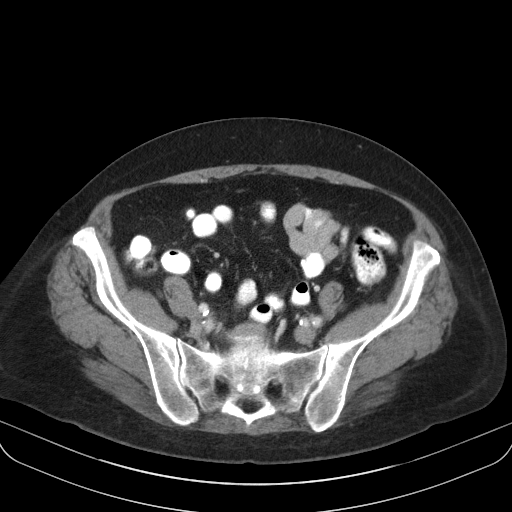
[im 41/93  soft-tissue]
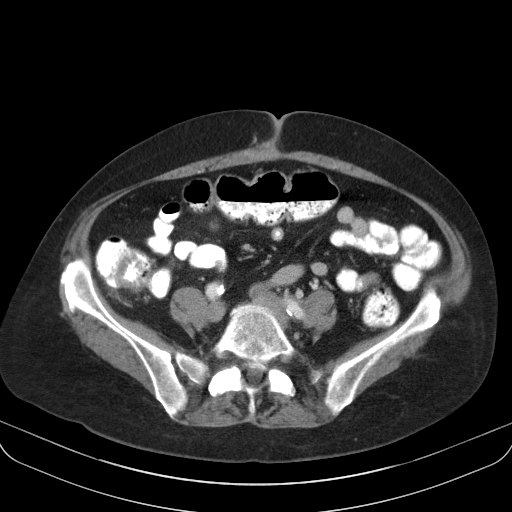
[im 47/93  soft-tissue]
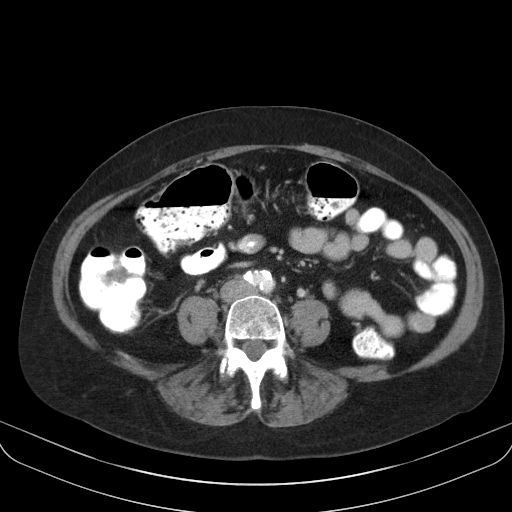
[im 52/93  soft-tissue]
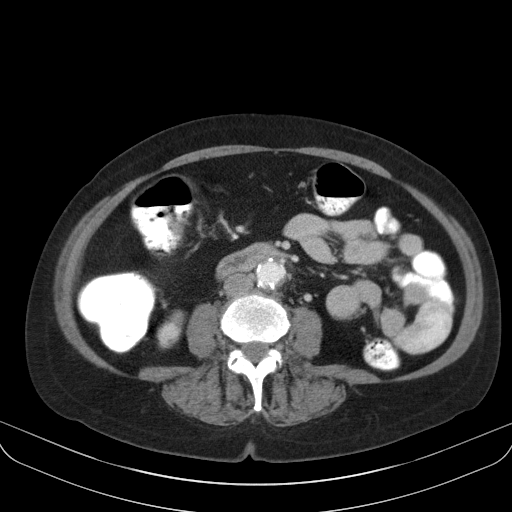
[im 58/93  soft-tissue]
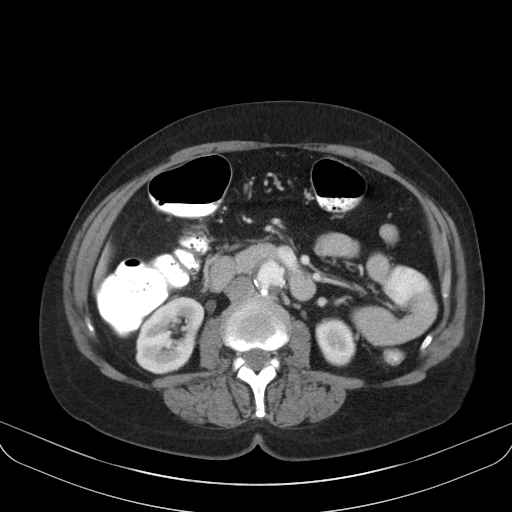
[im 58/93  bone]
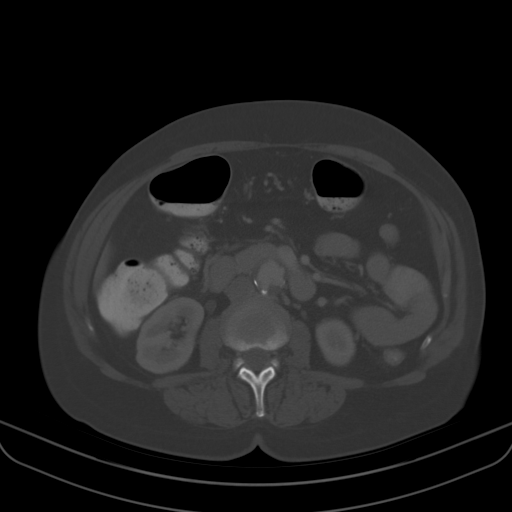
[im 64/93  soft-tissue]
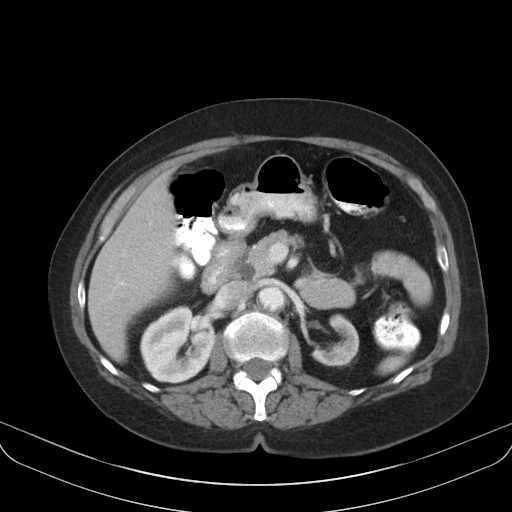
[im 70/93  lung]
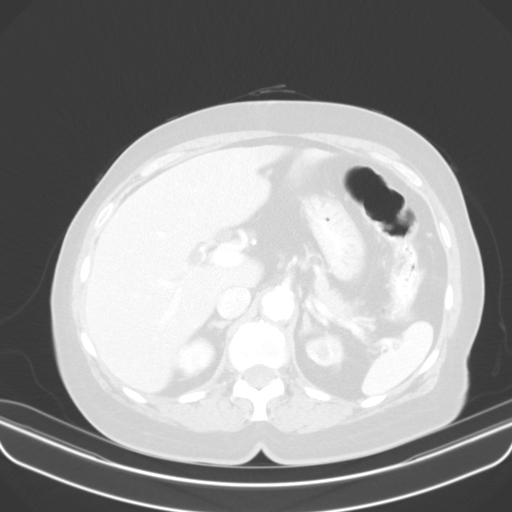
[im 75/93  soft-tissue]
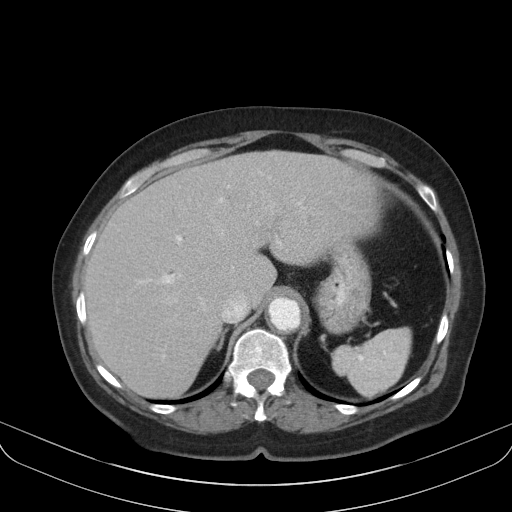
[im 75/93  lung]
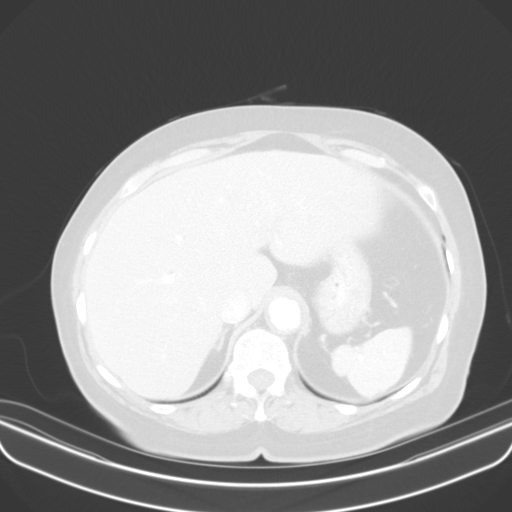
[im 81/93  soft-tissue]
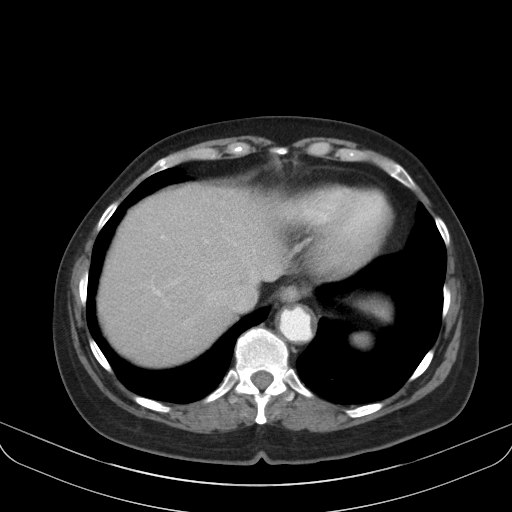
[im 81/93  lung]
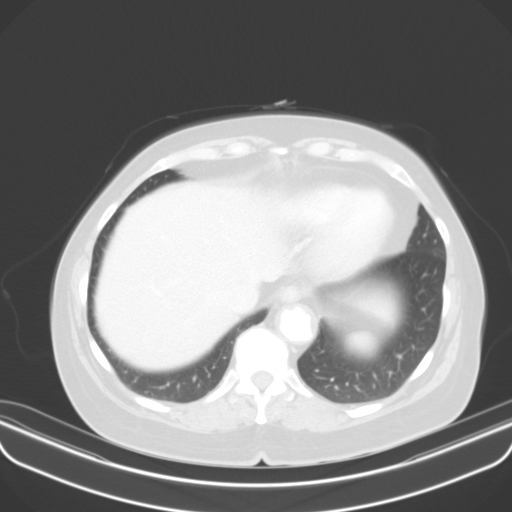
[im 87/93  soft-tissue]
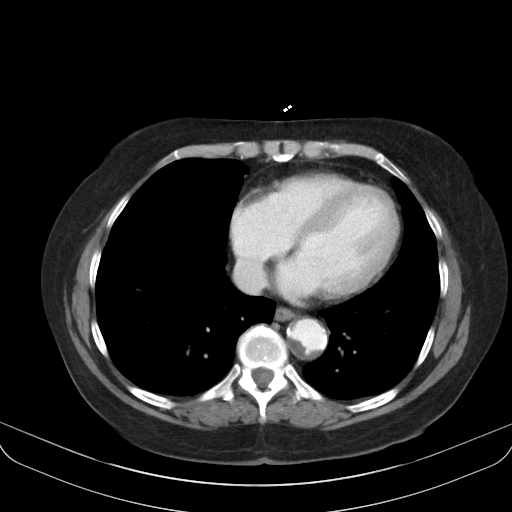
[im 87/93  lung]
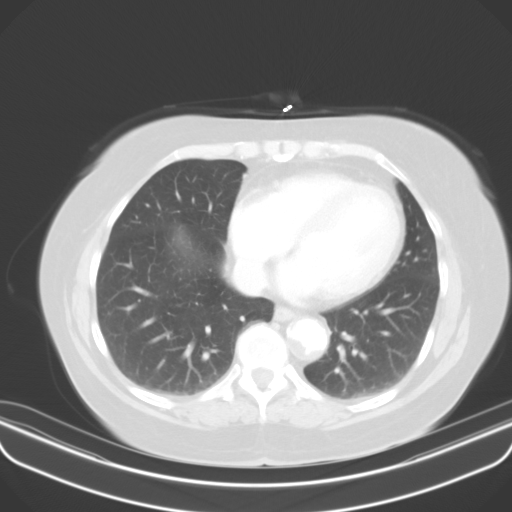

[14 of 32 positions shown; findings below may reference images not displayed]

FINDINGS: Lower chest: The visualized lung bases are clear.

No intra-abdominal free air or free fluid.

Hepatobiliary: Probable mild fatty liver. There is mild biliary
ductal dilatation, likely post cholecystectomy. The common bile duct
measures 16 mm in diameter similar to prior CT. No retained
calcified stone noted in the central CBD.

Pancreas: Unremarkable. No pancreatic ductal dilatation or
surrounding inflammatory changes.

Spleen: Normal in size without focal abnormality.

Adrenals/Urinary Tract: The adrenal glands unremarkable. There is
mild left renal mid and upper pole parenchyma atrophy similar to
prior CT. There is no hydronephrosis on either side. The right
kidney is unremarkable. The visualized ureters and urinary bladder
appear unremarkable.

Stomach/Bowel: There is sigmoid diverticulosis without active
inflammatory changes. There is no bowel obstruction or active
inflammation. Appendectomy.

Vascular/Lymphatic: Advanced aortoiliac atherosclerotic disease. The
aorta is ectatic measuring up to 2.6 cm in caliber. The IVC is
unremarkable. No portal venous gas. There is no adenopathy.

Reproductive: Hysterectomy.  No adnexal masses.

Other: None

Musculoskeletal: Degenerative changes of the spine. No acute osseous
pathology.
IMPRESSION: 1. No acute intra-abdominal or pelvic pathology.
2. Sigmoid diverticulosis. No bowel obstruction.
3. Aortic Atherosclerosis ([21]-[21]).

## 2020-08-30 MED ORDER — IOPAMIDOL (ISOVUE-300) INJECTION 61%
80.0000 mL | Freq: Once | INTRAVENOUS | Status: AC | PRN
  Administered 2020-08-30: 80 mL via INTRAVENOUS

## 2020-09-08 ENCOUNTER — Other Ambulatory Visit: Payer: Self-pay

## 2020-09-08 ENCOUNTER — Encounter: Payer: Self-pay | Admitting: Cardiovascular Disease

## 2020-09-08 ENCOUNTER — Ambulatory Visit: Payer: PPO | Admitting: Cardiovascular Disease

## 2020-09-08 VITALS — BP 190/78 | HR 53 | Ht 63.0 in | Wt 159.0 lb

## 2020-09-08 DIAGNOSIS — Z9049 Acquired absence of other specified parts of digestive tract: Secondary | ICD-10-CM

## 2020-09-08 DIAGNOSIS — I2510 Atherosclerotic heart disease of native coronary artery without angina pectoris: Secondary | ICD-10-CM | POA: Diagnosis not present

## 2020-09-08 DIAGNOSIS — E785 Hyperlipidemia, unspecified: Secondary | ICD-10-CM | POA: Diagnosis not present

## 2020-09-08 DIAGNOSIS — M5090 Cervical disc disorder, unspecified, unspecified cervical region: Secondary | ICD-10-CM

## 2020-09-08 DIAGNOSIS — I10 Essential (primary) hypertension: Secondary | ICD-10-CM | POA: Diagnosis not present

## 2020-09-08 MED ORDER — OLMESARTAN MEDOXOMIL 20 MG PO TABS: 20.0000 mg | ORAL_TABLET | Freq: Every day | ORAL | 3 refills | Status: DC

## 2020-09-08 NOTE — Progress Notes (Signed)
Cardiology Office Note    Date:  09/13/2020   ID:  [name], Cor[place] 1951/07/07, MRN [id number]  PCP:  Sandi Mariscal, MD  Cardiologist:  Shelva Majestic, MD   Chief Complaint  Patient presents with     F/U cardiology evaluation initally referred by Dr.Yun Sun at [facility] at [place]  History of Present Illness:  Robin Arellano is a 69 y.o. female who presents for 72-month follow-up cardiology evaluation.  I had seen remotely Robin Arellano over 25 years ago in early 1994 with atypical chest pain. She was recently evaluated in September 22, 2018 by Dr. Aletta Edouard at [facility] ER with complaints of intermittent chest pain has been occurring off and on over the past year.  Prior to that evaluation, she was evaluated by her PCP and was told that she may have had an old heart attack based on her ECG.  During her ER evaluation she was significantly hypertensive with a blood pressure 187/100.  A chest x-ray did not show any acute abnormalities.  She had experienced some transient left facial numbness for 5 days associated with some dizziness and a CT was done which revealed atrophy with small vessel chronic ischemic changes of deep cerebral white matter without acute intracranial abnormalities.  She has recently been evaluated at [facility] on [place] by Dr. Theressa Millard and she is felt to have prediabetes.  Lipid studies were increased with a total cholesterol of 319, triglycerides 209, LDL cholesterol 224, and she was told to initiate Crestor 10 mg which she has not yet started.  Her sedimentation rate was elevated at 64.  ANA was negative.  RF was normal.  Thyroid studies were normal.  Because of her recent symptomatology, she was referred for cardiology evaluation.  When I saw her in November 2019 she complained of some intermittent chest discomfort which occurs in the center of her chest and seems to radiate to her left shoulder. It is aggravated when she turns a certain way  and may last for hours and then ultimately resolves on its own.  She was told that this may be from "Iinside shingles."   She denied any exertional symptomatology.  Additional history is notable for chronic bilateral low back pain with bilateral sciatica.  She also  has fibromyalgia and osteoarthritis.  Has a history of anxiety and depression.  During my evaluation, I reviewed laboratory from her primary physician which suggested a high likelihood for familial hyperlipidemia with a total cholesterol at 319, LDL cholesterol at 224, triglycerides of 209, and HDL of 53.  With her recurrent chest pain which most likely had musculoskeletal features and her significant hyperlipidemia I recommended she undergo coronary CT a to evaluate potential coronary atherosclerosis and recommended an echo Doppler evaluation particularly with her hypertensive history.  The echo Doppler study was done on October 14, 2018 and showed a normal LV function and normal LV strain with an EF of 60 to 65%.  There was mild aortic valve sclerosis without stenosis, mild MR, and a mildly thickened atrial septum consistent with lipomatous hypertrophy.  She underwent coronary CTA on November 06, 2018 which showed moderate coronary plaque with moderate tubular atherosclerosis in the proximal RCA and mid RCA in the 50 to 69% range.  Left main coronary artery had mild plaque less than 25%.  Her LAD had mild proximal plaque in the 25 to 49% range, also had a moderate long segment of atherosclerotic plaque in the mid LAD just beyond the  second diagonal vessel of 50 to 69%.  She was felt to have possible severe stenosis in the distal LAD with 70 to 99% plaque.  She had a small caliber ramus intermediate vessel.  The circumflex had mild to moderate plaque of 25 to 49% proximally, and then 50 to 69% stenosis.  Subsequent FFR analysis not reveal any significant flow-limiting stenosis with the exception of the distal LAD where the FFR was 0.57.   When I   saw her November 20, 2018 she denied any classic exertional chest pain.  Her chest pain has been recurring and is described as being sharp and knifelike.  Typically it is of short duration.  She also has recently started rosuvastatin 20 mg and has begun to notice some calf discomfort attributed to initiation of statin therapy.    With her abnormal coronary CT angiogram definitive cardiac catheterization was recommended.  She underwent this catheterization by me on November 25, 2018 which revealed mild multivessel nonobstructive CAD with narrowings no greater than 25% in the LAD, circumflex marginal, and dominant RCA.  Medical therapy was recommended with aggressive lipid-lowering therapy with target LDL less than 70.  It was felt that the patient's chest pain had atypical features and most likely was nonischemic.  After her catheterization she ultimately underwent neurologic evaluation and subsequent MRI of her cervical spine which showed mild multilevel degenerative spondylolisthesis in the cervical spine with associated widespread cervical facet arthropathy.  There was superimposed cervical disc and endplate degeneration maximal at C5 and 6 but no associated cervical spinal stenosis.  There was moderate neuroforaminal stenosis at the right C4 right C5 bilateral C6 and left C7 nerve levels.  He has been seeing Dr. Maryjean Ka for pain management and had undergone epidural injection.  She also sees Dr. Vertell Limber.  She denies any exertional chest pain.  She has continued to take rosuvastatin for hyperlipidemia.  She was evaluated by me in a telemedicine encounter on February 16, 2020.  Since her prior evaluation she had undergone successful cervical disc surgery by Dr. Vertell Limber.  Prior to surgery she was having severe pain bilaterally in her arms.  This has essentially resolved.  She is now able to be more active.  Prior to her surgery she could not walk well due to significant pain.  She also tells me that she does have issues  with her lower back and in August will be undergoing an MRI and may also be undergoing an injection.  If symptoms progress she may require future low back surgery.  At her telemedicine visit, she denied any chest pain.  Apparently she had only been taking  metoprolol tartrate 25 mg once a day instead of as prescribed twice a day.  She has noticed some blood pressure elevation.  She denies any swelling.  She denies any anginal symptoms.  During her recent injection her blood pressure did significantly increase.  During that evaluation I suggested changing to metoprolol succinate initially 25 mg but her dose may need to be further titrated to 50 mg.  At that time she was on rosuvastatin 5 mg with target LDL less than 70.  Gildardo Griffes  was hospitalized for 6 days in May 2021 with a diagnosis of acute pancreatitis, common bile duct dilatation.  I last saw her in July 2021 at which time she remained stable from a cardiac standpoint and was scheduled to see  Dr. Barry Dienes at [facility] surgery for possible future cholecystectomy.  During her hospitalization, she was taken off  rosuvastatin and also was taken off metoprolol succinate.  She denied chest pain, PND orthopnea.  During that evaluation, her blood pressure was elevated and I recommended resumption of metoprolol tartrate 25 mg twice a day.  Also with discontinuance of rosuvastatin I recommended initiation of Zetia 10 mg daily.  Since I saw her, she underwent successful cholecystectomy on July 26, 2020.  She tolerated this well.  She has been off Crestor since May.  She has noted blood pressure elevation.  She denies recent chest pain or palpitations.  She has been on amlodipine 10 mg and lisinopril 5 mg daily and is only been taking metoprolol 12.5 mg twice a day.  She presents for evaluation.  Past Medical History:  Diagnosis Date  . ADHD   . Anxiety   . Back pain   . Chronic female pelvic pain   . Coronary artery disease    mild, non-obstructive  11/2018  . Depression   . Fibromyalgia   . H/O leukocytosis   . Headache   . Hypertension   . MI (myocardial infarction) ([place])    Pt states she did not have a MI- EKG was normal, was GERD  . Osteoarthritis   . Ovarian cyst, right   . Post-operative nausea and vomiting   . SVD (spontaneous vaginal delivery)    x 2  . Vitamin D deficiency     Past Surgical History:  Procedure Laterality Date  . ABDOMINAL HYSTERECTOMY  1994   TAH.BSO  . ANTERIOR CERVICAL DECOMP/DISCECTOMY FUSION  2019  . APPENDECTOMY  1975  . BACK SURGERY    . BIOPSY  05/12/2020   Procedure: BIOPSY;  Surgeon: Milus Banister, MD;  Location: [facility];  Service: Endoscopy;;  . CARDIAC CATHETERIZATION  2020  . CHOLECYSTECTOMY N/A 07/21/2020   Procedure: LAPAROSCOPIC CHOLECYSTECTOMY WITH INTRAOPERATIVE CHOLANGIOGRAM;  Surgeon: [name], MD;  Location: [facility];  Service: General;  Laterality: N/A;  . COLONOSCOPY  08/12/2017   Hx polyp/Jacobs  . ESOPHAGOGASTRODUODENOSCOPY (EGD) WITH PROPOFOL N/A 05/12/2020   Procedure: ESOPHAGOGASTRODUODENOSCOPY (EGD) WITH PROPOFOL;  Surgeon: Milus Banister, MD;  Location: [facility];  Service: Endoscopy;  Laterality: N/A;  . EUS N/A 05/12/2020   Procedure: UPPER ENDOSCOPIC ULTRASOUND (EUS) RADIAL;  Surgeon: Milus Banister, MD;  Location: [facility];  Service: Endoscopy;  Laterality: N/A;  . KNEE SURGERY Bilateral 1996   x 2 - arthroscopic  . LEFT HEART CATH AND CORONARY ANGIOGRAPHY N/A 11/25/2018   Procedure: LEFT HEART CATH AND CORONARY ANGIOGRAPHY;  Surgeon: Troy Sine, MD;  Location: [place] [facility];  Service: Cardiovascular;  Laterality: N/A;  . PELVIC LAPAROSCOPY  1989   W LYSIS OF ADHESIONS/L SALPINGONEOSTOMY  . WISDOM TOOTH EXTRACTION      Current Medications: Outpatient Medications Prior to Visit  Medication Sig Dispense Refill  . ALPRAZolam (XANAX) 0.5 MG tablet Take 0.5 mg by mouth at bedtime as needed for anxiety or sleep.     Marland Kitchen amLODipine (NORVASC) 10  MG tablet Take 1 tablet (10 mg total) by mouth daily. 90 tablet 3  . Ascorbic Acid (VITAMIN C) 1000 MG tablet Take 1,000 mg by mouth daily.    . butalbital-acetaminophen-caffeine (FIORICET, ESGIC) 50-325-40 MG tablet Take 1 tablet by mouth every 6 (six) hours as needed for headache.   0  . Cholecalciferol (VITAMIN D) 50 MCG (2000 UT) tablet Take 2,000 Units by mouth daily.     Marland Kitchen ezetimibe (ZETIA) 10 MG tablet Take 1 tablet (10 mg total) by  mouth daily. 90 tablet 3  . ferrous sulfate 325 (65 FE) MG tablet Take 1 tablet (325 mg total) by mouth daily with breakfast. (Patient taking differently: Take 325 mg by mouth daily with breakfast. 1 tablet every other day) 30 tablet 0  . HYDROcodone-acetaminophen (NORCO) 10-325 MG tablet Take 1 tablet by mouth 2 (two) times daily as needed for moderate pain.     . metoprolol tartrate (LOPRESSOR) 25 MG tablet Take 0.5 tablets (12.5 mg total) by mouth 2 (two) times daily. 90 tablet 1  . Multiple Vitamin (MULTIVITAMIN WITH MINERALS) TABS tablet Take 1 tablet by mouth daily.    Marland Kitchen omeprazole (PRILOSEC) 40 MG capsule Take 40 mg by mouth daily.    . promethazine (PHENERGAN) 12.5 MG tablet Take 1 tablet (12.5 mg total) by mouth every 6 (six) hours as needed for nausea or vomiting. 20 tablet 0  . valACYclovir (VALTREX) 500 MG tablet TAKE 1 TABLET BY MOUTH TWICE DAILY FOR 3 TO 5 DAYS THEN TAKE DAILY AS NEEDED (Patient taking differently: Take 500 mg by mouth daily as needed (shingles). ) 30 tablet 0  . zolpidem (AMBIEN) 10 MG tablet Take 10 mg by mouth at bedtime as needed for sleep.     Marland Kitchen lisinopril (ZESTRIL) 5 MG tablet TAKE 1 TABLET(5 MG) BY MOUTH DAILY (Patient taking differently: Take 5 mg by mouth daily. ) 90 tablet 2  . oxyCODONE (OXY IR/ROXICODONE) 5 MG immediate release tablet Take 1 tablet (5 mg total) by mouth every 6 (six) hours as needed for severe pain. (Patient not taking: Reported on 09/08/2020) 20 tablet 0   No facility-administered medications prior to  visit.     Allergies:   Prednisone and Latex   Social History   Socioeconomic History  . Marital status: Married    Spouse name: don  . Number of children: 2  . Years of education: College  . Highest education level: Not on file  Occupational History  . Occupation: Realtor  Tobacco Use  . Smoking status: Former Smoker    Packs/day: 0.15    Years: 20.00    Pack years: 3.00    Types: Cigarettes    Quit date: 11/12/1998    Years since quitting: 21.8  . Smokeless tobacco: Never Used  Vaping Use  . Vaping Use: Never used  Substance and Sexual Activity  . Alcohol use: No  . Drug use: No  . Sexual activity: Not Currently    Birth control/protection: Post-menopausal, Surgical    Comment: HYSTERECTOMY  Other Topics Concern  . Not on file  Social History Narrative   Lives at home with her husband Timmothy Sours   Right handed   Caffeine: unsweet tea, 2 glasses daily   Social Determinants of Health   Financial Resource Strain:   . Difficulty of Paying Living Expenses: Not on file  Food Insecurity:   . Worried About Charity fundraiser in the Last Year: Not on file  . Ran Out of Food in the Last Year: Not on file  Transportation Needs:   . Lack of Transportation (Medical): Not on file  . Lack of Transportation (Non-Medical): Not on file  Physical Activity:   . Days of Exercise per Week: Not on file  . Minutes of Exercise per Session: Not on file  Stress:   . Feeling of Stress : Not on file  Social Connections:   . Frequency of Communication with Friends and Family: Not on file  . Frequency of Social Gatherings with  Friends and Family: Not on file  . Attends Religious Services: Not on file  . Active Member of Clubs or Organizations: Not on file  . Attends Archivist Meetings: Not on file  . Marital Status: Not on file    Social history is notable in that she has had several marriages and currently has been married for 16 years.  She has 2 children, 2 grandchildren.  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  There is a higher tobacco history, she quit in 2002.  She does yoga intermittently.  Family History:  The patient's family history includes Aneurysm in her father; COPD in her brother; Cancer in her mother; Heart disease in her paternal grandfather; Other in her sister and sister; Skin cancer in her brother and brother.   Her mother died at age 57.  Her father died at age 1 and had blood clots.  A brother died at age 65 and was exposed to agent orange.  She has a brother age 47 with cancer.  She has 5 sisters.   ROS General: Negative; No fevers, chills, or night sweats;  HEENT: Negative; No changes in vision or hearing, sinus congestion, difficulty swallowing Pulmonary: Negative; No cough, wheezing, shortness of breath, hemoptysis Cardiovascular: See HPI GI: Negative; No nausea, vomiting, diarrhea, or abdominal pain GU: Recent UTI, just started on Macrobid Musculoskeletal: Chronic low back pain, osteo-arthritis, fibromyalgia Hematologic/Oncology: Negative; no easy bruising, bleeding Endocrine: Negative; no heat/cold intolerance; no diabetes Neuro: Negative; no changes in balance, headaches Skin: Negative; No rashes or skin lesions Psychiatric: Negative; No behavioral problems, depression Sleep: Negative; No snoring, daytime sleepiness, hypersomnolence, bruxism, restless legs, hypnogognic hallucinations, no cataplexy Other comprehensive 14 point system review is negative.   PHYSICAL EXAM:   VS:  BP (!) 190/78   Pulse (!) 53   Ht $R'5\' 3"'Zv$  (1.6 m)   Wt 159 lb (72.1 kg)   LMP  (LMP Unknown)   SpO2 98%   BMI 28.17 kg/m     Repeat blood pressure by me was elevated at 180/78.  She states her blood pressure at home has been labile ranging from 130s to the 170s.  Wt Readings from Last 3 Encounters:  09/08/20 159 lb (72.1 kg)  07/21/20 158 lb 8 oz (71.9 kg)  07/13/20 158 lb 8 oz (71.9 kg)      Physical Exam BP (!) 190/78   Pulse (!) 53   Ht  $R'5\' 3"'fh$  (1.6 m)   Wt 159 lb (72.1 kg)   LMP  (LMP Unknown)   SpO2 98%   BMI 28.17 kg/m  General: Alert, oriented, no distress.  Skin: normal turgor, no rashes, warm and dry HEENT: Normocephalic, atraumatic. Pupils equal round and reactive to light; sclera anicteric; extraocular muscles intact;  Nose without nasal septal hypertrophy Mouth/Parynx benign; Mallinpatti scale 2 Neck: No JVD, no carotid bruits; normal carotid upstroke Lungs: clear to ausculatation and percussion; no wheezing or rales Chest wall: without tenderness to palpitation Heart: PMI not displaced, RRR, s1 s2 normal, 1/6 systolic murmur, no diastolic murmur, no rubs, gallops, thrills, or heaves Abdomen: soft, nontender; no hepatosplenomehaly, BS+; abdominal aorta nontender and not dilated by palpation. Back: no CVA tenderness Pulses 2+ Musculoskeletal: full range of motion, normal strength, no joint deformities Extremities: no clubbing cyanosis or edema, Homan's sign negative  Neurologic: grossly nonfocal; Cranial nerves grossly wnl Psychologic: Normal mood and affect  Studies/Labs Reviewed:   ECG (independently read by me): Sinus bradycardia 53 bpm.  No ectopy.  Normal intervals     July 20, 2021ECG (independently read by me): Normal sinus rhythm at 66 bpm.  Left atrial enlargement.  Nonspecific ST changes.  QTc interval 461 Robin.  March 2020 ECG (independently read by me): Sinus bradycardia 59 bpm.  Normal intervals.  No ectopy.  November 20, 2018 EKG:  EKG is ordered today. Sinus Bradycardia at 58;Q wave III, no STT changes  October 06, 2018 ECG (independently read by me): Normal sinus rhythm at 66 bpm.  Nondiagnostic Q waves in lead III and aVF.  Normal intervals.  No ectopy.  No ST segment changes.  Recent Labs: BMP Latest Ref Rng & Units 07/13/2020 04/20/2020 04/07/2020  Glucose 70 - 99 mg/dL 98 106(H) 96  BUN 8 - 23 mg/dL 15 9 5(L)  Creatinine 0.44 - 1.00 mg/dL 1.23(H) 1.15 0.88  BUN/Creat Ratio 12 - 28 - - -   Sodium 135 - 145 mmol/L 138 132(L) 136  Potassium 3.5 - 5.1 mmol/L 4.6 4.5 3.9  Chloride 98 - 111 mmol/L 104 100 98  CO2 22 - 32 mmol/L $RemoveB'25 26 25  'lmZNrELo$ Calcium 8.9 - 10.3 mg/dL 9.2 9.5 8.4(L)     Hepatic Function Latest Ref Rng & Units 07/13/2020 04/20/2020 04/07/2020  Total Protein 6.5 - 8.1 g/dL 6.5 7.6 6.6  Albumin 3.5 - 5.0 g/dL 3.4(L) 4.0 3.0(L)  AST 15 - 41 U/L $Remo'16 16 20  'ttsWr$ ALT 0 - 44 U/L 15 11 40  Alk Phosphatase 38 - 126 U/L 64 99 104  Total Bilirubin 0.3 - 1.2 mg/dL 0.8 0.3 0.6  Bilirubin, Direct 0.0 - 0.2 mg/dL - - -    CBC Latest Ref Rng & Units 07/13/2020 04/20/2020 04/07/2020  WBC 4.0 - 10.5 K/uL 11.1(H) 11.8(H) 7.5  Hemoglobin 12.0 - 15.0 g/dL 12.0 11.6(L) 8.9(L)  Hematocrit 36 - 46 % 37.5 35.0(L) 28.1(L)  Platelets 150 - 400 K/uL 333 465.0(H) 244   Lab Results  Component Value Date   MCV 103.9 (H) 07/13/2020   MCV 96.7 04/20/2020   MCV 101.1 (H) 04/07/2020   Lab Results  Component Value Date   TSH 1.600 03/08/2020   No results found for: HGBA1C   BNP No results found for: BNP  ProBNP No results found for: PROBNP   Lipid Panel     Component Value Date/Time   CHOL 155 03/08/2020 1300   TRIG 138 03/08/2020 1300   HDL 49 03/08/2020 1300   CHOLHDL 3.2 03/08/2020 1300   LDLCALC 82 03/08/2020 1300     RADIOLOGY: CT ABDOMEN PELVIS W CONTRAST  Result Date: 08/31/2020 CLINICAL DATA:  69 year old female with left lower quadrant abdominal pain. EXAM: CT ABDOMEN AND PELVIS WITH CONTRAST TECHNIQUE: Multidetector CT imaging of the abdomen and pelvis was performed using the standard protocol following bolus administration of intravenous contrast. CONTRAST:  76mL ISOVUE-300 IOPAMIDOL (ISOVUE-300) INJECTION 61% COMPARISON:  CT abdomen pelvis dated 04/06/2020. FINDINGS: Lower chest: The visualized lung bases are clear. No intra-abdominal free air or free fluid. Hepatobiliary: Probable mild fatty liver. There is mild biliary ductal dilatation, likely post cholecystectomy. The  common bile duct measures 16 mm in diameter similar to prior CT. No retained calcified stone noted in the central CBD. Pancreas: Unremarkable. No pancreatic ductal dilatation or surrounding inflammatory changes. Spleen: Normal in size without focal abnormality. Adrenals/Urinary Tract: The adrenal glands unremarkable. There is mild left renal mid and upper pole parenchyma atrophy similar to prior CT. There is no hydronephrosis on either side. The right kidney  is unremarkable. The visualized ureters and urinary bladder appear unremarkable. Stomach/Bowel: There is sigmoid diverticulosis without active inflammatory changes. There is no bowel obstruction or active inflammation. Appendectomy. Vascular/Lymphatic: Advanced aortoiliac atherosclerotic disease. The aorta is ectatic measuring up to 2.6 cm in caliber. The IVC is unremarkable. No portal venous gas. There is no adenopathy. Reproductive: Hysterectomy.  No adnexal masses. Other: None Musculoskeletal: Degenerative changes of the spine. No acute osseous pathology. IMPRESSION: 1. No acute intra-abdominal or pelvic pathology. 2. Sigmoid diverticulosis. No bowel obstruction. 3. Aortic Atherosclerosis (ICD10-I70.0). Electronically Signed   By: Anner Crete M.D.   On: 08/31/2020 00:38     Additional studies/ records that were reviewed today include:  I reviewed the records from the emergency room.  I reviewed the records from [facility] by Arville Go.   ------------------------------------------------------------------- 10/14/2018 ECHO Study Conclusions  - Left ventricle: Average global longitudinal LV strain is normal   at -18.7% The cavity size was normal. Systolic function was   normal. The estimated ejection fraction was in the range of 60%   to 65%. Wall motion was normal; there were no regional wall   motion abnormalities. The study is not technically sufficient to   allow evaluation of LV diastolic function. -  Aortic valve: Trileaflet; mildly thickened, mildly calcified   leaflets. - Mitral valve: There was mild regurgitation. - Atrial septum: There was increased thickness of the septum,   consistent with lipomatous hypertrophy.   ASSESSMENT:    1. Essential hypertension   2. CAD in native artery   3. Hyperlipidemia with target LDL less than 70   4. Cervical disc disease   5. History of cholecystectomy      PLAN:  Robin. Zaela Tecson is a 69 year old female who has experienced intermittent episodes of somewhat atypical chest discomfort.  Her chest pain has been occurring off and on for the past year typically is nonexertional.  Her ECG shows normal sinus rhythm.  She has small nondiagnostic inferior Q waves.  I do not believe she has had a prior myocardial infarction.  She has stage II hypertension and her blood pressure was significantly elevated at her emergency room evaluation.  When I initially saw her I recommended the addition of amlodipine to her medical regimen. An echo Doppler study demonstrated normal systolic without wall motion abnormalities and with normal global longitudinal strain.  There was evidence for mild aortic sclerosis without stenosis, mild MR, and probable lipomatous hypertrophy of her atrial septum.  She  continued to experience sharp stabbing-like chest pain and previously on physical examination today she had definite tenderness to her costochondral region pectoral region suggesting her chest pain is most likely of musculoskeletal etiology.  Her CT angiogram raised the possibility of at least moderate multivessel coronary atherosclerosis and her distal LAD lesion was felt to be FFR significant.  Cardiac catheterization did not  demonstrate high-grade obstructive disease with no narrowings greater than 25% in the LAD circumflex marginal and dominant RCA.  She has subsequently been found to have multilevel cervical degenerative spondylolisthesis as well as cervical disc disease  as noted above.  She underwent successful cervical disc surgery by Dr. Vertell Limber with significant benefit in improvement in her severe bilateral arm discomfort.  She recently had experienced an episode of pancreatitis with dilated bile duct and underwent successful cholecystectomy on July 26, 2020.  Her blood pressure today is elevated and she has had labile hypertension.  I have recommended she discontinue lisinopril and will  start her olmesartan 20 mg daily.  I have recommended next week she undergo follow-up fasting laboratory including a comprehensive metabolic panel, CBC, TSH and lipid studies.  She continues to be on Zetia 10 mg.  She had been on rosuvastatin but this has been discontinued when she had gallbladder issues.  She will monitor her blood pressure at home.  She is not having any anginal symptoms.  I will see her in the office in 2 months for follow-up evaluation.  Medication Adjustments/Labs and Tests Ordered: Current medicines are reviewed at length with the patient today.  Concerns regarding medicines are outlined above.  Medication changes, Labs and Tests ordered today are listed in the Patient Instructions below. Patient Instructions  Medication Instructions:  Discontinue lisinopril and begin taking olmesartan $RemoveBeforeDEI'20mg'[id number]$  once daily. *If you need a refill on your cardiac medications before your next appointment, please call your pharmacy*   Lab Work: Fasting Labs, CMET, CBC, TSH, and lipid panel.   If you have labs (blood work) drawn today and your tests are completely normal, you will receive your results only by: Marland Kitchen MyChart Message (if you have MyChart) OR . A paper copy in the mail If you have any lab test that is abnormal or we need to change your treatment, we will call you to review the results.   Follow-Up: At [facility], you and your health needs are our priority.  As part of our continuing mission to provide you with exceptional heart care, we have created designated  Provider Care Teams.  These Care Teams include your primary Cardiologist (physician) and Advanced Practice Providers (APPs -  Physician Assistants and Nurse Practitioners) who all work together to provide you with the care you need, when you need it.  We recommend signing up for the patient portal called "MyChart".  Sign up information is provided on this After Visit Summary.  MyChart is used to connect with patients for Virtual Visits (Telemedicine).  Patients are able to view lab/test results, encounter notes, upcoming appointments, etc.  Non-urgent messages can be sent to your provider as well.   To learn more about what you can do with MyChart, go to [email or web address].    Your next appointment:   2 month(s)  The format for your next appointment:   In Person  Provider:   Shelva Majestic, MD      Signed, Shelva Majestic, MD  09/13/2020 6:08 PM    [facility] [address], [place], [place], [place]  [address] Phone: [phone]

## 2020-09-08 NOTE — Patient Instructions (Signed)
Medication Instructions:  Discontinue lisinopril and begin taking olmesartan 20mg  once daily. *If you need a refill on your cardiac medications before your next appointment, please call your pharmacy*   Lab Work: Fasting Labs, CMET, CBC, TSH, and lipid panel.   If you have labs (blood work) drawn today and your tests are completely normal, you will receive your results only by: Marland Kitchen MyChart Message (if you have MyChart) OR . A paper copy in the mail If you have any lab test that is abnormal or we need to change your treatment, we will call you to review the results.   Follow-Up: At [facility], you and your health needs are our priority.  As part of our continuing mission to provide you with exceptional heart care, we have created designated Provider Care Teams.  These Care Teams include your primary Cardiologist (physician) and Advanced Practice Providers (APPs -  Physician Assistants and Nurse Practitioners) who all work together to provide you with the care you need, when you need it.  We recommend signing up for the patient portal called "MyChart".  Sign up information is provided on this After Visit Summary.  MyChart is used to connect with patients for Virtual Visits (Telemedicine).  Patients are able to view lab/test results, encounter notes, upcoming appointments, etc.  Non-urgent messages can be sent to your provider as well.   To learn more about what you can do with MyChart, go to [email or web address].    Your next appointment:   2 month(s)  The format for your next appointment:   In Person  Provider:   Shelva Majestic, MD

## 2020-09-13 ENCOUNTER — Encounter: Payer: Self-pay | Admitting: Cardiovascular Disease

## 2020-10-10 DIAGNOSIS — M5416 Radiculopathy, lumbar region: Secondary | ICD-10-CM | POA: Diagnosis not present

## 2020-10-13 DIAGNOSIS — E785 Hyperlipidemia, unspecified: Secondary | ICD-10-CM | POA: Diagnosis not present

## 2020-10-14 LAB — COMPREHENSIVE METABOLIC PANEL
ALT: 14 IU/L (ref 0–32)
AST: 17 IU/L (ref 0–40)
Albumin/Globulin Ratio: 1.4 (ref 1.2–2.2)
Albumin: 4.4 g/dL (ref 3.8–4.8)
Alkaline Phosphatase: 97 IU/L (ref 44–121)
BUN/Creatinine Ratio: 16 (ref 12–28)
BUN: 18 mg/dL (ref 8–27)
Bilirubin Total: 0.3 mg/dL (ref 0.0–1.2)
CO2: 20 mmol/L (ref 20–29)
Calcium: 9.8 mg/dL (ref 8.7–10.3)
Chloride: 98 mmol/L (ref 96–106)
Creatinine, Ser: 1.15 mg/dL — ABNORMAL HIGH (ref 0.57–1.00)
GFR calc Af Amer: 56 mL/min/{1.73_m2} — ABNORMAL LOW (ref >59)
GFR calc non Af Amer: 49 mL/min/{1.73_m2} — ABNORMAL LOW (ref >59)
Globulin, Total: 3.1 g/dL (ref 1.5–4.5)
Glucose: 89 mg/dL (ref 65–99)
Potassium: 4.5 mmol/L (ref 3.5–5.2)
Sodium: 136 mmol/L (ref 134–144)
Total Protein: 7.5 g/dL (ref 6.0–8.5)

## 2020-10-14 LAB — CBC
Hematocrit: 36.1 % (ref 34.0–46.6)
Hemoglobin: 12.6 g/dL (ref 11.1–15.9)
MCH: 33.9 pg — ABNORMAL HIGH (ref 26.6–33.0)
MCHC: 34.9 g/dL (ref 31.5–35.7)
MCV: 97 fL (ref 79–97)
Platelets: 388 10*3/uL (ref 150–450)
RBC: 3.72 x10E6/uL — ABNORMAL LOW (ref 3.77–5.28)
RDW: 12.2 % (ref 11.7–15.4)
WBC: 12.7 10*3/uL — ABNORMAL HIGH (ref 3.4–10.8)

## 2020-10-14 LAB — LIPID PANEL
Chol/HDL Ratio: 4 ratio (ref 0.0–4.4)
Cholesterol, Total: 262 mg/dL — ABNORMAL HIGH (ref 100–199)
HDL: 66 mg/dL (ref >39)
LDL Chol Calc (NIH): 160 mg/dL — ABNORMAL HIGH (ref 0–99)
Triglycerides: 197 mg/dL — ABNORMAL HIGH (ref 0–149)
VLDL Cholesterol Cal: 36 mg/dL (ref 5–40)

## 2020-10-14 LAB — TSH: TSH: 0.86 u[IU]/mL (ref 0.450–4.500)

## 2020-11-08 ENCOUNTER — Telehealth: Payer: Self-pay | Admitting: Cardiovascular Disease

## 2020-11-08 DIAGNOSIS — M5136 Other intervertebral disc degeneration, lumbar region: Secondary | ICD-10-CM | POA: Diagnosis not present

## 2020-11-08 DIAGNOSIS — R519 Headache, unspecified: Secondary | ICD-10-CM | POA: Diagnosis not present

## 2020-11-08 DIAGNOSIS — M5416 Radiculopathy, lumbar region: Secondary | ICD-10-CM | POA: Diagnosis not present

## 2020-11-08 DIAGNOSIS — M542 Cervicalgia: Secondary | ICD-10-CM | POA: Diagnosis not present

## 2020-11-08 NOTE — Telephone Encounter (Signed)
Encounter not needed

## 2020-11-10 ENCOUNTER — Other Ambulatory Visit: Payer: Self-pay | Admitting: Cardiovascular Disease

## 2020-11-11 ENCOUNTER — Other Ambulatory Visit: Payer: Self-pay | Admitting: Gastroenterology

## 2020-11-14 ENCOUNTER — Other Ambulatory Visit: Payer: Self-pay | Admitting: Cardiovascular Disease

## 2020-11-24 ENCOUNTER — Ambulatory Visit (INDEPENDENT_AMBULATORY_CARE_PROVIDER_SITE_OTHER): Payer: PPO | Admitting: Pharmacist Clinician (PhC)/ Clinical Pharmacy Specialist

## 2020-11-24 ENCOUNTER — Other Ambulatory Visit: Payer: Self-pay

## 2020-11-24 DIAGNOSIS — E782 Mixed hyperlipidemia: Secondary | ICD-10-CM

## 2020-11-24 MED ORDER — ROSUVASTATIN CALCIUM 5 MG PO TABS: ORAL_TABLET | ORAL | 3 refills | Status: DC

## 2020-11-24 NOTE — Progress Notes (Signed)
11/25/2020 Robin Arellano 04-28-1951 [id number]   HPI:  Robin Arellano is a 70 y.o. female patient of Dr Claiborne Billings, with a PMH below who presents today for hypertension clinic evaluation.  In addition to hyperlipidemia, her medical history is significant for fibromyalgia and rheumatoid arthritis.  A heart cath in 2020 showed mild multi-vessel nonobstructive CAD with up to 25% narrowings in multiple coronary vessels.   She is currently on ezetimibe 10 mg once daily and has previously only tried rosuvastatin in varying doses of 10-40 mg daily.  It was stopped when she was hospitalized last spring for pancreatitis ane elevated liver enzymes.  At that time she had been on rosuvastatin for the better part of a year.   A coronary calcium score of 4 placed her in the 51st percentile for age/sex.  Blood Pressure Goal:  130/80  Current Medications: ezetimibe 10 mg am  Family Hx: family history includes Aneurysm in leg in her father; COPD in her brother; Cancer in her mother; Heart disease in her paternal grandfather; Other in her sister and sister; Skin cancer in her brother and brother. Her mother died at age 55. Her father died at age 49 and had blood clots. A brother died at age 41 and was exposed to agent orange. She has a brother age 57 with cancer (both brothers with Agent Orange exposure); 5 sisters  Social Hx: no tobacco, no alcohol, no caffeine  Diet: chicken broth, chicken, rare red meat, lots of salad, occasional potatoes; plenty of vegetables; some fruits, but cautious due to sugar  Exercise: bad back limits, crushed disc, lower spine problems - cortisone injections every 3-4 months  Intolerances: rosuvastatin in daily doses - myalgias  Labs: 10/2021: TC 262, TG 197, HDL 66, LDL 160  262, 197, 66, 160  Wt Readings from Last 3 Encounters:  09/08/20 159 lb (72.1 kg)  07/21/20 158 lb 8 oz (71.9 kg)  07/13/20 158 lb 8 oz (71.9 kg)   BP Readings from Last 3 Encounters:   09/08/20 (!) 190/78  07/21/20 (!) 163/67  07/13/20 (!) 162/62   Pulse Readings from Last 3 Encounters:  09/08/20 (!) 53  07/21/20 68  07/13/20 (!) 47    Current Outpatient Medications  Medication Sig Dispense Refill  . rosuvastatin (CRESTOR) 5 MG tablet Take 1 tablet by mouth up to 3 times per week as tolerated 30 tablet 3  . ALPRAZolam (XANAX) 0.5 MG tablet Take 0.5 mg by mouth at bedtime as needed for anxiety or sleep.     Marland Kitchen amLODipine (NORVASC) 10 MG tablet TAKE 1 TABLET(10 MG) BY MOUTH DAILY 90 tablet 3  . Ascorbic Acid (VITAMIN C) 1000 MG tablet Take 1,000 mg by mouth daily.    . butalbital-acetaminophen-caffeine (FIORICET, ESGIC) 50-325-40 MG tablet Take 1 tablet by mouth every 6 (six) hours as needed for headache.   0  . Cholecalciferol (VITAMIN D) 50 MCG (2000 UT) tablet Take 2,000 Units by mouth daily.     Marland Kitchen ezetimibe (ZETIA) 10 MG tablet TAKE 1 TABLET(10 MG) BY MOUTH DAILY 90 tablet 3  . ferrous sulfate 325 (65 FE) MG tablet Take 1 tablet (325 mg total) by mouth daily with breakfast. (Patient taking differently: Take 325 mg by mouth daily with breakfast. 1 tablet every other day) 30 tablet 0  . HYDROcodone-acetaminophen (NORCO) 10-325 MG tablet Take 1 tablet by mouth 2 (two) times daily as needed for moderate pain.     . metoprolol tartrate (LOPRESSOR) 25  MG tablet Take 0.5 tablets (12.5 mg total) by mouth 2 (two) times daily. 90 tablet 1  . Multiple Vitamin (MULTIVITAMIN WITH MINERALS) TABS tablet Take 1 tablet by mouth daily.    Marland Kitchen olmesartan (BENICAR) 20 MG tablet Take 1 tablet (20 mg total) by mouth daily. 90 tablet 3  . omeprazole (PRILOSEC) 40 MG capsule TAKE 1 CAPSULE(40 MG) BY MOUTH DAILY 30 capsule 1  . promethazine (PHENERGAN) 12.5 MG tablet Take 1 tablet (12.5 mg total) by mouth every 6 (six) hours as needed for nausea or vomiting. 20 tablet 0  . valACYclovir (VALTREX) 500 MG tablet TAKE 1 TABLET BY MOUTH TWICE DAILY FOR 3 TO 5 DAYS THEN TAKE DAILY AS NEEDED (Patient  taking differently: Take 500 mg by mouth daily as needed (shingles). ) 30 tablet 0  . zolpidem (AMBIEN) 10 MG tablet Take 10 mg by mouth at bedtime as needed for sleep.      No current facility-administered medications for this visit.    Allergies  Allergen Reactions  . Prednisone Hives, Itching and Swelling    Pt says this no longer bothers her  . Latex Rash    Past Medical History:  Diagnosis Date  . ADHD   . Anxiety   . Back pain   . Chronic female pelvic pain   . Coronary artery disease    mild, non-obstructive 11/2018  . Depression   . Fibromyalgia   . H/O leukocytosis   . Headache   . Hypertension   . MI (myocardial infarction) ([place])    Pt states she did not have a MI- EKG was normal, was GERD  . Osteoarthritis   . Ovarian cyst, right   . Post-operative nausea and vomiting   . SVD (spontaneous vaginal delivery)    x 2  . Vitamin D deficiency     There were no vitals taken for this visit.  Hyperlipidemia Patient with hyperlipidemia and LDL goal < 100.  She is not eligible for PCSK-9 inhibitor or bempedoic acid because of a lack of obstructive ASCVD or prior event.  We discussed options including trial with a different statin or going back on rosuvastatin but just 2 days per week to start.  Patient agreeable to trying rosuvastatin 5 mg twice weekly.  If she tolerates this, we will repeat labs after 8-10 weeks.     Tommy Medal PharmD CPP [facility] [address] [place], [place] [address] [phone]

## 2020-11-24 NOTE — Patient Instructions (Signed)
Your Results:             Your most recent labs Goal  Total Cholesterol 262 < 200  Triglycerides 197 < 150  HDL (happy/good cholesterol) 66 > 40  LDL (lousy/bad cholesterol 160 < 100   Medication changes:  Start rosuvastatin 5 mg twice weekly - Mondays and Fridays  If this causes any muscle aches or pains then stop for 2 weeks and re-start at one tablet weekly.   Call Amani Marseille/Raquel at [phone] if you continue to have problems.  Lab orders:  We will mail you an order to repeat lab work in early April   Thank you for choosing [facility]

## 2020-11-25 ENCOUNTER — Encounter: Payer: Self-pay | Admitting: Pharmacist Clinician (PhC)/ Clinical Pharmacy Specialist

## 2020-11-25 DIAGNOSIS — E785 Hyperlipidemia, unspecified: Secondary | ICD-10-CM | POA: Insufficient documentation

## 2020-11-25 NOTE — Assessment & Plan Note (Signed)
Patient with hyperlipidemia and LDL goal < 100.  She is not eligible for PCSK-9 inhibitor or bempedoic acid because of a lack of obstructive ASCVD or prior event.  We discussed options including trial with a different statin or going back on rosuvastatin but just 2 days per week to start.  Patient agreeable to trying rosuvastatin 5 mg twice weekly.  If she tolerates this, we will repeat labs after 8-10 weeks.

## 2020-12-02 ENCOUNTER — Other Ambulatory Visit: Payer: Self-pay | Admitting: Internal Medicine

## 2020-12-02 DIAGNOSIS — Z1231 Encounter for screening mammogram for malignant neoplasm of breast: Secondary | ICD-10-CM

## 2020-12-08 ENCOUNTER — Ambulatory Visit: Payer: PPO

## 2020-12-19 ENCOUNTER — Ambulatory Visit: Payer: PPO | Admitting: Cardiovascular Disease

## 2020-12-19 ENCOUNTER — Other Ambulatory Visit: Payer: Self-pay

## 2020-12-19 ENCOUNTER — Encounter: Payer: Self-pay | Admitting: Cardiovascular Disease

## 2020-12-19 VITALS — BP 184/82 | HR 65 | Ht 62.5 in | Wt 158.0 lb

## 2020-12-19 DIAGNOSIS — E785 Hyperlipidemia, unspecified: Secondary | ICD-10-CM

## 2020-12-19 DIAGNOSIS — M5090 Cervical disc disorder, unspecified, unspecified cervical region: Secondary | ICD-10-CM | POA: Diagnosis not present

## 2020-12-19 DIAGNOSIS — Z9049 Acquired absence of other specified parts of digestive tract: Secondary | ICD-10-CM | POA: Diagnosis not present

## 2020-12-19 DIAGNOSIS — I2510 Atherosclerotic heart disease of native coronary artery without angina pectoris: Secondary | ICD-10-CM

## 2020-12-19 DIAGNOSIS — E782 Mixed hyperlipidemia: Secondary | ICD-10-CM

## 2020-12-19 DIAGNOSIS — I10 Essential (primary) hypertension: Secondary | ICD-10-CM | POA: Diagnosis not present

## 2020-12-19 MED ORDER — OLMESARTAN MEDOXOMIL 40 MG PO TABS
40.0000 mg | ORAL_TABLET | Freq: Every day | ORAL | 3 refills | Status: DC
Start: 2020-12-19 — End: 2021-03-13

## 2020-12-19 MED ORDER — METOPROLOL TARTRATE 25 MG PO TABS: ORAL_TABLET | ORAL | 3 refills | Status: DC

## 2020-12-19 NOTE — Patient Instructions (Signed)
Medication Instructions:  INCREASE olmesartan to 40 mg daily INCREASE metoprolol tartrate (Lopressor) to 25 mg in the AM and 12.5 mg in the PM  *If you need a refill on your cardiac medications before your next appointment, please call your pharmacy*   Lab Work: Please return for FASTING labs in 6 weeks (CMET, Lipid)  Our in office lab hours are Monday-Friday 8:00-4:00, closed for lunch 12:45-1:45 pm.  No appointment needed.  Follow-Up: At [facility], you and your health needs are our priority.  As part of our continuing mission to provide you with exceptional heart care, we have created designated Provider Care Teams.  These Care Teams include your primary Cardiologist (physician) and Advanced Practice Providers (APPs -  Physician Assistants and Nurse Practitioners) who all work together to provide you with the care you need, when you need it.  We recommend signing up for the patient portal called "MyChart".  Sign up information is provided on this After Visit Summary.  MyChart is used to connect with patients for Virtual Visits (Telemedicine).  Patients are able to view lab/test results, encounter notes, upcoming appointments, etc.  Non-urgent messages can be sent to your provider as well.   To learn more about what you can do with MyChart, go to [email or web address].    Your next appointment:   2 month(s)  The format for your next appointment:   In Person  Provider:   Shelva Majestic, MD

## 2020-12-19 NOTE — Progress Notes (Signed)
Cardiology Office Note    Date:  12/21/2020   ID:  Robin Arellano, Robin Arellano [birth year], MRN [id number]  PCP:  Sandi Mariscal, MD  Cardiologist:  Shelva Majestic, MD   F/U evaluation initally referred by Dr.Yun Nancy Fetter at [facility] [place]  History of Present Illness:  Robin Arellano is a 70 y.o. female who presents for 52-monthfollow-up cardiology evaluation.  I had seen remotely Robin Arellano 25 years ago in early 1994 with atypical chest pain. She was recently [facility] September 22, 2018 [facility] complaints of intermittent chest pain has been occurring off and on over the past year.  Prior to that evaluation, she was evaluated by her PCP and was told that she may have had an old heart attack based on her ECG.  During her ER evaluation she was significantly hypertensive with a blood pressure 187/100.  A chest x-ray did not show any acute abnormalities.  She had experienced some transient left facial numbness for 5 days associated with some dizziness and a CT was done which revealed atrophy with small vessel chronic ischemic changes of deep cerebral white matter without acute intracranial abnormalities.  [facility] [place] by Dr. STheressa Millardand she is felt to have prediabetes.  Lipid studies were increased with a total cholesterol of 319, triglycerides 209, LDL cholesterol 224, and she was told to initiate Crestor 10 mg which she has not yet started.  Her sedimentation rate was elevated at 64.  ANA was negative.  RF was normal.  Thyroid studies were normal.  Because of her recent symptomatology, she was referred for cardiology evaluation.  When I saw her in November 2019 she complained of some intermittent chest discomfort which occurs in the center of her chest and seems to radiate to her left shoulder. It is aggravated when she turns a certain way and may last for hours and then ultimately resolves on its  own.  She was told that this may be from "Iinside shingles."   She denied any exertional symptomatology.  Additional history is notable for chronic bilateral low back pain with bilateral sciatica.  She also  has fibromyalgia and osteoarthritis.  Has a history of anxiety and depression.  During my evaluation, I reviewed laboratory from her primary physician which suggested a high likelihood for familial hyperlipidemia with a total cholesterol at 319, LDL cholesterol at 224, triglycerides of 209, and HDL of 53.  With her recurrent chest pain which most likely had musculoskeletal features and her significant hyperlipidemia I recommended she undergo coronary CT a to evaluate potential coronary atherosclerosis and recommended an echo Doppler evaluation particularly with her hypertensive history.  The echo Doppler study was done on October 14, 2018 and showed a normal LV function and normal LV strain with an EF of 60 to 65%.  There was mild aortic valve sclerosis without stenosis, mild MR, and a mildly thickened atrial septum consistent with lipomatous hypertrophy.  She underwent coronary CTA on November 06, 2018 which showed moderate coronary plaque with moderate tubular atherosclerosis in the proximal RCA and mid RCA in the 50 to 69% range.  Left main coronary artery had mild plaque less than 25%.  Her LAD had mild proximal plaque in the 25 to 49% range, also had a moderate long segment of atherosclerotic plaque in the mid LAD just beyond the second diagonal vessel of 50 to 69%.  She was felt  to have possible severe stenosis in the distal LAD with 70 to 99% plaque.  She had a small caliber ramus intermediate vessel.  The circumflex had mild to moderate plaque of 25 to 49% proximally, and then 50 to 69% stenosis.  Subsequent FFR analysis not reveal any significant flow-limiting stenosis with the exception of the distal LAD where the FFR was 0.57.   When I  saw her November 20, 2018 she denied any classic exertional  chest pain.  Her chest pain has been recurring and is described as being sharp and knifelike.  Typically it is of short duration.  She also has recently started rosuvastatin 20 mg and has begun to notice some calf discomfort attributed to initiation of statin therapy.    With her abnormal coronary CT angiogram definitive cardiac catheterization was recommended.  She underwent this catheterization by me on November 25, 2018 which revealed mild multivessel nonobstructive CAD with narrowings no greater than 25% in the LAD, circumflex marginal, and dominant RCA.  Medical therapy was recommended with aggressive lipid-lowering therapy with target LDL less than 70.  It was felt that the patient's chest pain had atypical features and most likely was nonischemic.  After her catheterization she ultimately underwent neurologic evaluation and subsequent MRI of her cervical spine which showed mild multilevel degenerative spondylolisthesis in the cervical spine with associated widespread cervical facet arthropathy.  There was superimposed cervical disc and endplate degeneration maximal at C5 and 6 but no associated cervical spinal stenosis.  There was moderate neuroforaminal stenosis at the right C4 right C5 bilateral C6 and left C7 nerve levels.  He has been seeing Dr. Maryjean Ka for pain management and had undergone epidural injection.  She also sees Dr. Vertell Limber.  She denies any exertional chest pain.  She has continued to take rosuvastatin for hyperlipidemia.  She was evaluated by me in a telemedicine encounter on February 16, 2020.  Since her prior evaluation she had undergone successful cervical disc surgery by Dr. Vertell Limber.  Prior to surgery she was having severe pain bilaterally in her arms.  This has essentially resolved.  She is now able to be more active.  Prior to her surgery she could not walk well due to significant pain.  She also tells me that she does have issues with her lower back and in August will be undergoing an  MRI and may also be undergoing an injection.  If symptoms progress she may require future low back surgery.  At her telemedicine visit, she denied any chest pain.  Apparently she had only been taking  metoprolol tartrate 25 mg once a day instead of as prescribed twice a day.  She has noticed some blood pressure elevation.  She denies any swelling.  She denies any anginal symptoms.  During her recent injection her blood pressure did significantly increase.  During that evaluation I suggested changing to metoprolol succinate initially 25 mg but her dose may need to be further titrated to 50 mg.  At that time she was on rosuvastatin 5 mg with target LDL less than 70.  She was hospitalized for 6 days in May 2021 with a diagnosis of acute pancreatitis and common bile duct dilatation.  I last saw her in July 2021 at which time she remained stable from a cardiac standpoint and was scheduled [facility] possible future cholecystectomy.  During her hospitalization, she was taken off rosuvastatin and also was taken off metoprolol succinate.  She denied  chest pain, PND orthopnea.  During that evaluation, her blood pressure was elevated and I recommended resumption of metoprolol tartrate 25 mg twice a day.  Also with discontinuance of rosuvastatin I recommended initiation of Zetia 10 mg daily.  She underwent successful cholecystectomy on July 26, 2020.  She tolerated this well.  She had been off Crestor since May.    I last saw her on September 08, 2020.  At that time her blood pressure was significantly elevated at 190/78 and on repeat by me was 180/78.  She was on amlodipine 10 mg and lisinopril 5 mg and had only been taking metoprolol 12.5 mg twice a day.  At that time I recommended she discontinue lisinopril and started her on olmesartan 20 mg.  I recommended follow-up laboratory.    Laboratory in December showed a total cholesterol 262, triglycerides 197, LDL 160 and HDL  36.  Creatinine was 1.15.  TSH was normal.  She was subsequently seen by Joslyn Hy, [facility] on November 24, 2020.  She was felt not eligible for PCSK9 inhibition due to her nonobstructive CAD where she was only found to have mild multivessel disease with up to 25% narrowings.  She was rechallenged with Crestor 5 mg 3 times per week.  She dramatically changed her diet and stopped eating red meat and has been having predominantly chicken and salads.  Her blood pressure has improved but remains elevated and at home is in the 160-170 range.  She presents for evaluation.  Past Medical History:  Diagnosis Date  . ADHD   . Anxiety   . Back pain   . Chronic female pelvic pain   . Coronary artery disease    mild, non-obstructive 11/2018  . Depression   . Fibromyalgia   . H/O leukocytosis   . Headache   . Hypertension   . MI (myocardial infarction) ([place])    Pt states she did not have a MI- EKG was normal, was GERD  . Osteoarthritis   . Ovarian cyst, right   . Post-operative nausea and vomiting   . SVD (spontaneous vaginal delivery)    x 2  . Vitamin D deficiency     Past Surgical History:  Procedure Laterality Date  . ABDOMINAL HYSTERECTOMY  1994   TAH.BSO  . ANTERIOR CERVICAL DECOMP/DISCECTOMY FUSION  2019  . APPENDECTOMY  1975  . BACK SURGERY    . BIOPSY  05/12/2020   Procedure: BIOPSY;  Surgeon: Milus Banister, MD;  Location: [facility];  Service: Endoscopy;;  . CARDIAC CATHETERIZATION  2020  . CHOLECYSTECTOMY N/A 07/21/2020   Procedure: LAPAROSCOPIC CHOLECYSTECTOMY WITH INTRAOPERATIVE CHOLANGIOGRAM;  Surgeon: [name], MD;  Location: [place];  Service: General;  Laterality: N/A;  . COLONOSCOPY  08/12/2017   Hx polyp/Jacobs  . ESOPHAGOGASTRODUODENOSCOPY (EGD) WITH PROPOFOL N/A 05/12/2020   Procedure: ESOPHAGOGASTRODUODENOSCOPY (EGD) WITH PROPOFOL;  Surgeon: Milus Banister, MD;  Location: [facility];  Service: Endoscopy;  Laterality: N/A;  . EUS N/A 05/12/2020   Procedure:  UPPER ENDOSCOPIC ULTRASOUND (EUS) RADIAL;  Surgeon: Milus Banister, MD;  Location: [facility];  Service: Endoscopy;  Laterality: N/A;  . KNEE SURGERY Bilateral 1996   x 2 - arthroscopic  . LEFT HEART CATH AND CORONARY ANGIOGRAPHY N/A 11/25/2018   Procedure: LEFT HEART CATH AND CORONARY ANGIOGRAPHY;  Surgeon: Troy Sine, MD;  Location: [place] [facility];  Service: Cardiovascular;  Laterality: N/A;  . PELVIC LAPAROSCOPY  1989   W LYSIS OF ADHESIONS/L SALPINGONEOSTOMY  . WISDOM  TOOTH EXTRACTION      Current Medications: Outpatient Medications Prior to Visit  Medication Sig Dispense Refill  . ALPRAZolam (XANAX) 0.5 MG tablet Take 0.5 mg by mouth at bedtime as needed for anxiety or sleep.     Marland Kitchen amLODipine (NORVASC) 10 MG tablet TAKE 1 TABLET(10 MG) BY MOUTH DAILY 90 tablet 3  . Ascorbic Acid (VITAMIN C) 1000 MG tablet Take 1,000 mg by mouth daily.    . Cholecalciferol (VITAMIN D) 50 MCG (2000 UT) tablet Take 2,000 Units by mouth daily.     Marland Kitchen ezetimibe (ZETIA) 10 MG tablet TAKE 1 TABLET(10 MG) BY MOUTH DAILY 90 tablet 3  . HYDROcodone-acetaminophen (NORCO) 10-325 MG tablet Take 1 tablet by mouth 2 (two) times daily as needed for moderate pain.     . Multiple Vitamin (MULTIVITAMIN WITH MINERALS) TABS tablet Take 1 tablet by mouth daily.    Marland Kitchen omeprazole (PRILOSEC) 40 MG capsule TAKE 1 CAPSULE(40 MG) BY MOUTH DAILY 30 capsule 1  . rosuvastatin (CRESTOR) 5 MG tablet Take 1 tablet by mouth up to 3 times per week as tolerated 30 tablet 3  . valACYclovir (VALTREX) 500 MG tablet TAKE 1 TABLET BY MOUTH TWICE DAILY FOR 3 TO 5 DAYS THEN TAKE DAILY AS NEEDED (Patient taking differently: Take 500 mg by mouth daily as needed (shingles).) 30 tablet 0  . zolpidem (AMBIEN) 10 MG tablet Take 10 mg by mouth at bedtime as needed for sleep.     Marland Kitchen olmesartan (BENICAR) 20 MG tablet Take 1 tablet (20 mg total) by mouth daily. 90 tablet 3  . butalbital-acetaminophen-caffeine (FIORICET, ESGIC) 50-325-40 MG tablet  Take 1 tablet by mouth every 6 (six) hours as needed for headache.   0  . ferrous sulfate 325 (65 FE) MG tablet Take 1 tablet (325 mg total) by mouth daily with breakfast. (Patient taking differently: Take 325 mg by mouth daily with breakfast. 1 tablet every other day) 30 tablet 0  . metoprolol tartrate (LOPRESSOR) 25 MG tablet Take 0.5 tablets (12.5 mg total) by mouth 2 (two) times daily. 90 tablet 1  . promethazine (PHENERGAN) 12.5 MG tablet Take 1 tablet (12.5 mg total) by mouth every 6 (six) hours as needed for nausea or vomiting. 20 tablet 0   No facility-administered medications prior to visit.     Allergies:   Latex   Social History   Socioeconomic History  . Marital status: Married    Spouse name: don  . Number of children: 2  . Years of education: College  . Highest education level: Not on file  Occupational History  . Occupation: Realtor  Tobacco Use  . Smoking status: Former Smoker    Packs/day: 0.15    Years: 20.00    Pack years: 3.00    Types: Cigarettes    Quit date: 11/12/1998    Years since quitting: 22.1  . Smokeless tobacco: Never Used  Vaping Use  . Vaping Use: Never used  Substance and Sexual Activity  . Alcohol use: No  . Drug use: No  . Sexual activity: Not Currently    Birth control/protection: Post-menopausal, Surgical    Comment: HYSTERECTOMY  Other Topics Concern  . Not on file  Social History Narrative   Lives at home with her husband Timmothy Sours   Right handed   Caffeine: unsweet tea, 2 glasses daily   Social Determinants of Health   Financial Resource Strain: Not on file  Food Insecurity: Not on file  Transportation Needs: Not on file  Physical Activity: Not on file  Stress: Not on file  Social Connections: Not on file    Social history is notable in that she has had several marriages and currently has been married for 16 years.  She has 2 children, 2 grandchildren.  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  There is a higher tobacco  history, she quit in 2002.  She does yoga intermittently.  Family History:  The patient's family history includes Aneurysm in her father; COPD in her brother; Cancer in her mother; Heart disease in her paternal grandfather; Other in her sister and sister; Skin cancer in her brother and brother.   Her mother died at age 17.  Her father died at age 86 and had blood clots.  A brother died at age 79 and was exposed to agent orange.  She has a brother age 25 with cancer.  She has 5 sisters.   ROS General: Negative; No fevers, chills, or night sweats;  HEENT: Negative; No changes in vision or hearing, sinus congestion, difficulty swallowing Pulmonary: Negative; No cough, wheezing, shortness of breath, hemoptysis Cardiovascular: See HPI GI: Recent pancreatitis and cholecystectomy GU: History of UTIs Musculoskeletal: Chronic low back pain, osteo-arthritis, fibromyalgia Hematologic/Oncology: Negative; no easy bruising, bleeding Endocrine: Negative; no heat/cold intolerance; no diabetes Neuro: Negative; no changes in balance, headaches Skin: Negative; No rashes or skin lesions Psychiatric: Negative; No behavioral problems, depression Sleep: Negative; No snoring, daytime sleepiness, hypersomnolence, bruxism, restless legs, hypnogognic hallucinations, no cataplexy Other comprehensive 14 point system review is negative.   PHYSICAL EXAM:   VS:  Pulse 65   Ht 5' 2.5" (1.588 m)   Wt 158 lb (71.7 kg)   LMP  (LMP Unknown)   BMI 28.44 kg/m     Repeat blood pressure by me 184/82  Wt Readings from Last 3 Encounters:  12/19/20 158 lb (71.7 kg)  09/08/20 159 lb (72.1 kg)  07/21/20 158 lb 8 oz (71.9 kg)    General: Alert, oriented, no distress.  Skin: normal turgor, no rashes, warm and dry HEENT: Normocephalic, atraumatic. Pupils equal round and reactive to light; sclera anicteric; extraocular muscles intact;  Nose without nasal septal hypertrophy Mouth/Parynx benign; Mallinpatti scale 2 Neck: No  JVD, no carotid bruits; normal carotid upstroke Lungs: clear to ausculatation and percussion; no wheezing or rales Chest wall: without tenderness to palpitation Heart: PMI not displaced, RRR, s1 s2 normal, 1/6 systolic murmur, no diastolic murmur, no rubs, gallops, thrills, or heaves Abdomen: soft, nontender; no hepatosplenomehaly, BS+; abdominal aorta nontender and not dilated by palpation. Back: no CVA tenderness Pulses 2+ Musculoskeletal: full range of motion, normal strength, no joint deformities Extremities: no clubbing cyanosis or edema, Homan's sign negative  Neurologic: grossly nonfocal; Cranial nerves grossly wnl Psychologic: Normal mood and affect  Studies/Labs Reviewed:   ECG (independently read by me): Sinus bradycardia 53 bpm.  No ectopy.  Normal intervals     July 20, 2021ECG (independently read by me): Normal sinus rhythm at 66 bpm.  Left atrial enlargement.  Nonspecific ST changes.  QTc interval 461 Robin.  March 2020 ECG (independently read by me): Sinus bradycardia 59 bpm.  Normal intervals.  No ectopy.  November 20, 2018 EKG:  EKG is ordered today. Sinus Bradycardia at 58;Q wave III, no STT changes  October 06, 2018 ECG (independently read by me): Normal sinus rhythm at 66 bpm.  Nondiagnostic Q waves in lead III and aVF.  Normal intervals.  No ectopy.  No ST segment changes.  Recent Labs: BMP Latest Ref Rng & Units 10/13/2020 07/13/2020 04/20/2020  Glucose 65 - 99 mg/dL 89 98 106(H)  BUN 8 - 27 mg/dL _0 Creatinine 0.57 - 1.00 mg/dL 1.15(H) 1.23(H) 1.15  BUN/Creat Ratio 12 - 28 16 - -  Sodium 134 - 144 mmol/L 136 138 132(L)  Potassium 3.5 - 5.2 mmol/L 4.5 4.6 4.5  Chloride 96 - 106 mmol/L 98 104 100  CO2 20 - 29 mmol/L _1 Calcium 8.7 - 10.3 mg/dL 9.8 9.2 9.5     Hepatic Function Latest Ref Rng & Units 10/13/2020 07/13/2020 04/20/2020  Total Protein 6.0 - 8.5 g/dL 7.5 6.5 7.6  Albumin 3.8 - 4.8 g/dL 4.4 3.4(L) 4.0  AST 0 - 40 IU/L _2 ALT 0 - 32 IU/L _3 Alk Phosphatase 44 - 121 IU/L 97 64 99  Total Bilirubin 0.0 - 1.2 mg/dL 0.3 0.8 0.3  Bilirubin, Direct 0.0 - 0.2 mg/dL - - -    CBC Latest Ref Rng & Units 10/13/2020 07/13/2020 04/20/2020  WBC 3.4 - 10.8 x10E3/uL 12.7(H) 11.1(H) 11.8(H)  Hemoglobin 11.1 - 15.9 g/dL 12.6 12.0 11.6(L)  Hematocrit 34.0 - 46.6 % 36.1 37.5 35.0(L)  Platelets 150 - 450 x10E3/uL 388 333 465.0(H)   Lab Results  Component Value Date   MCV 97 10/13/2020   MCV 103.9 (H) 07/13/2020   MCV 96.7 04/20/2020   Lab Results  Component Value Date   TSH 0.860 10/13/2020   No results found for: HGBA1C   BNP No results found for: BNP  ProBNP No results found for: PROBNP   Lipid Panel     Component Value Date/Time   CHOL 262 (H) 10/13/2020 1212   TRIG 197 (H) 10/13/2020 1212   HDL 66 10/13/2020 1212   CHOLHDL 4.0 10/13/2020 1212   LDLCALC 160 (H) 10/13/2020 1212     RADIOLOGY: No results found.   Additional studies/ records that were reviewed today include:  I reviewed the records from the emergency room.  I reviewed the records from [place] [facility] by Arville Go.   ------------------------------------------------------------------- 10/14/2018 ECHO Study Conclusions  - Left ventricle: Average global longitudinal LV strain is normal   at -18.7% The cavity size was normal. Systolic function was   normal. The estimated ejection fraction was in the range of 60%   to 65%. Wall motion was normal; there were no regional wall   motion abnormalities. The study is not technically sufficient to   allow evaluation of LV diastolic function. - Aortic valve: Trileaflet; mildly thickened, mildly calcified   leaflets. - Mitral valve: There was mild regurgitation. - Atrial septum: There was increased thickness of the septum,   consistent with lipomatous hypertrophy.   ASSESSMENT:    1. Essential hypertension   2. CAD in native artery   3. Hyperlipidemia with target LDL  less than 70   4. Cervical disc disease   5. History of cholecystectomy     PLAN:  Robin Arellano is a 70 year old female who has experienced intermittent episodes of somewhat atypical chest discomfort.  Her chest pain has been occurring off and on for the past year typically is nonexertional.  Her ECG shows normal sinus rhythm.  She has small nondiagnostic inferior Q waves.  I do not believe she has had a prior myocardial infarction.  She has had issues with stage II hypertension and her blood pressure was significantly elevated at her  emergency room evaluation.  When I initially saw her I recommended the addition of amlodipine to her medical regimen. An echo Doppler study demonstrated normal systolic without wall motion abnormalities and with normal global longitudinal strain.  There was evidence for mild aortic sclerosis without stenosis, mild MR, and probable lipomatous hypertrophy of her atrial septum.  She  continued to experience sharp stabbing-like chest pain and previously on physical examination today she had definite tenderness to her costochondral region pectoral region suggesting her chest pain is most likely of musculoskeletal etiology.  Her CT angiogram raised the possibility of at least moderate multivessel coronary atherosclerosis and her distal LAD lesion was felt to be FFR significant.  Cardiac catheterization did not  demonstrate high-grade obstructive disease with no narrowings greater than 25% in the LAD circumflex marginal and dominant RCA.  She has subsequently been found to have multilevel cervical degenerative spondylolisthesis as well as cervical disc disease as noted above.  She underwent successful cervical disc surgery by Dr. Vertell Limber with significant benefit in improvement in her severe bilateral arm discomfort.  She recently had experienced an episode of pancreatitis with dilated bile duct and underwent successful cholecystectomy on July 26, 2020.  Over the last several  months, she has had blood pressure lability.  Medication adjustment was made and most recently she has been on a regimen of amlodipine 10 mg, olmesartan 20 mg in addition to metoprolol which she has only been taking 12.5 mg twice a day.  With her continued blood pressure elevation today I have recommended further titration of olmesartan to 40 mg daily.  I am also further increasing her metoprolol tartrate to 25 mg twice a day.  She continues to be on Zetia and has been rechallenged with very low-dose rosuvastatin 5 mg up to 3 times per week.  She has been on this for approximately 3 weeks.  She has drastically changed her diet and essentially stopped eating any red meat and has been eating predominantly chicken or salad.  I am rechecking a comprehensive metabolic panel and lipid studies fasting in 6 weeks.  We discussed the importance of sodium restriction.  She will monitor her blood pressure.  I will see her in 2 months for reevaluation or sooner as needed.   Medication Adjustments/Labs and Tests Ordered: Current medicines are reviewed at length with the patient today.  Concerns regarding medicines are outlined above.  Medication changes, Labs and Tests ordered today are listed in the Patient Instructions below. Patient Instructions  Medication Instructions:  INCREASE olmesartan to 40 mg daily INCREASE metoprolol tartrate (Lopressor) to 25 mg in the AM and 12.5 mg in the PM  *If you need a refill on your cardiac medications before your next appointment, please call your pharmacy*   Lab Work: Please return for FASTING labs in 6 weeks (CMET, Lipid)  Our in office lab hours are Monday-Friday 8:00-4:00, closed for lunch 12:45-1:45 pm.  No appointment needed.  Follow-Up: At [facility], you and your health needs are our priority.  As part of our continuing mission to provide you with exceptional heart care, we have created designated Provider Care Teams.  These Care Teams include your primary  Cardiologist (physician) and Advanced Practice Providers (APPs -  Physician Assistants and Nurse Practitioners) who all work together to provide you with the care you need, when you need it.  We recommend signing up for the patient portal called "MyChart".  Sign up information is provided on this After Visit Summary.  MyChart is  used to connect with patients for Virtual Visits (Telemedicine).  Patients are able to view lab/test results, encounter notes, upcoming appointments, etc.  Non-urgent messages can be sent to your provider as well.   To learn more about what you can do with MyChart, go to [email or web address].    Your next appointment:   2 month(s)  The format for your next appointment:   In Person  Provider:   Shelva Majestic, MD        Signed, Shelva Majestic, MD  12/21/2020 5:23 PM    [facility] [address] [place], [place], [place]  [address] Phone: [phone]

## 2020-12-21 ENCOUNTER — Encounter: Payer: Self-pay | Admitting: Cardiovascular Disease

## 2020-12-22 ENCOUNTER — Ambulatory Visit
Admission: RE | Admit: 2020-12-22 | Discharge: 2020-12-22 | Disposition: A | Discharge status: Home / Self Care | Payer: PPO | Source: Ambulatory Visit | Attending: Internal Medicine | Admitting: Internal Medicine

## 2020-12-22 ENCOUNTER — Other Ambulatory Visit: Payer: Self-pay

## 2020-12-22 ENCOUNTER — Ambulatory Visit: Payer: PPO

## 2020-12-22 DIAGNOSIS — Z1231 Encounter for screening mammogram for malignant neoplasm of breast: Secondary | ICD-10-CM | POA: Diagnosis not present

## 2020-12-22 IMAGING — MG MM DIGITAL SCREENING BILAT W/ TOMO AND CAD
6 of 10 series · 6 of 30 positions shown · non-contrast
Comparison: Previous exam(s).

CLINICAL DATA: Screening.

EXAM:
DIGITAL SCREENING BILATERAL MAMMOGRAM WITH TOMOSYNTHESIS AND CAD
TECHNIQUE: Bilateral screening digital craniocaudal and mediolateral oblique
mammograms were obtained. Bilateral screening digital breast
tomosynthesis was performed. The images were evaluated with
computer-aided detection.

[R CC synth-2D]
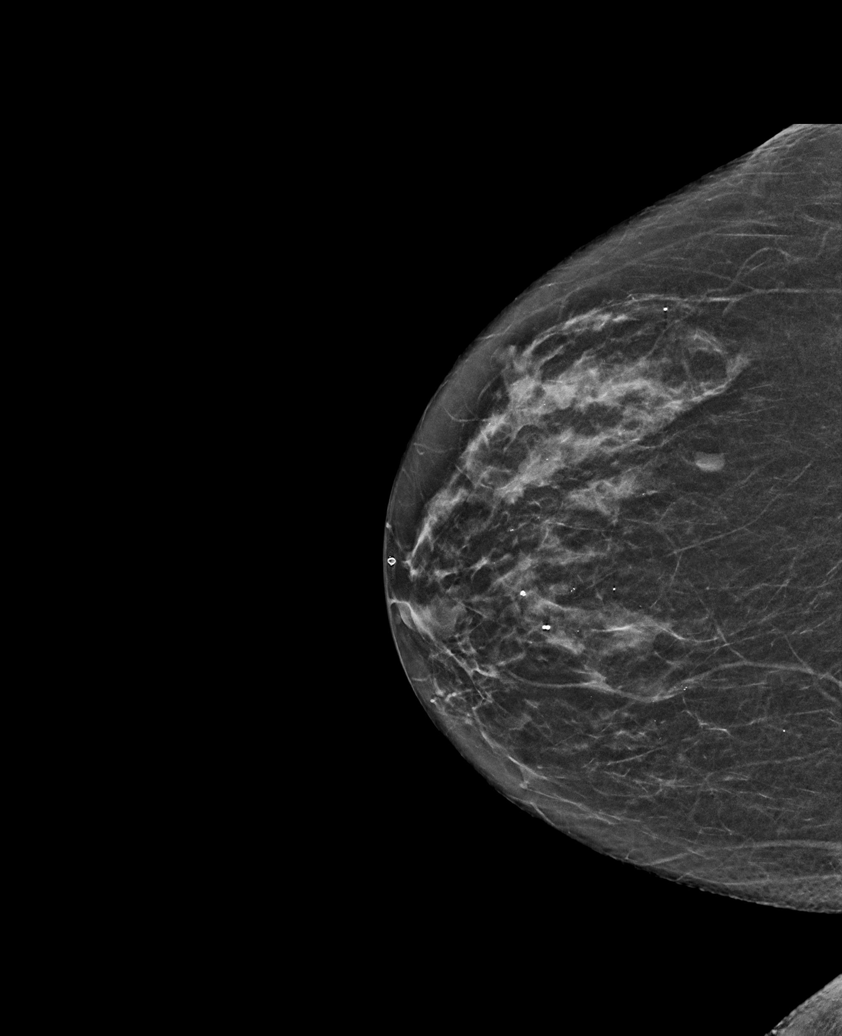

[R MLO synth-2D]
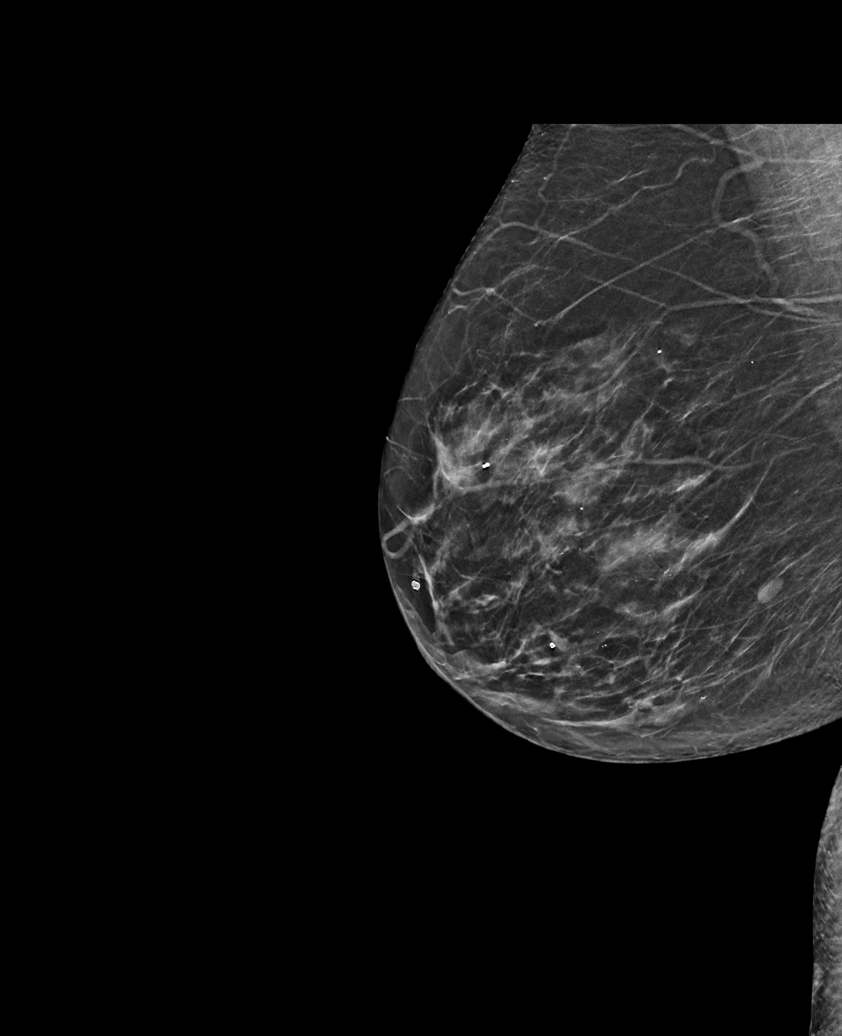

[L MLO synth-2D]
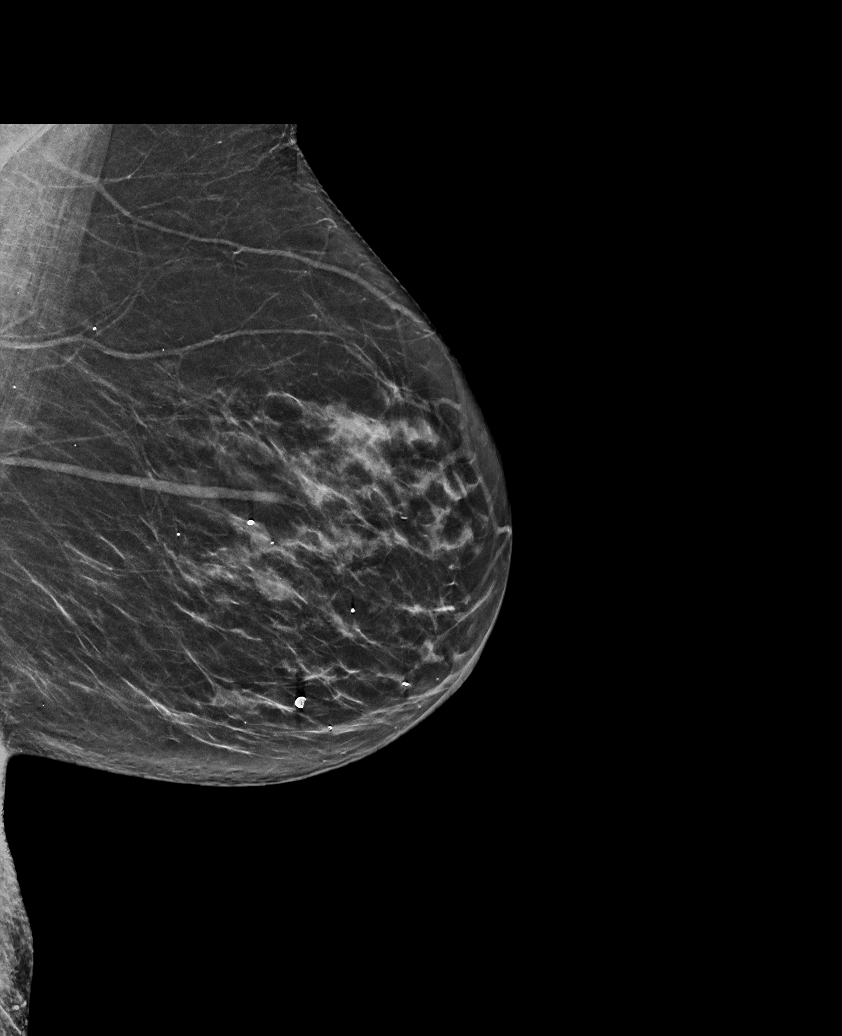

[L CC synth-2D (1 of 2)]
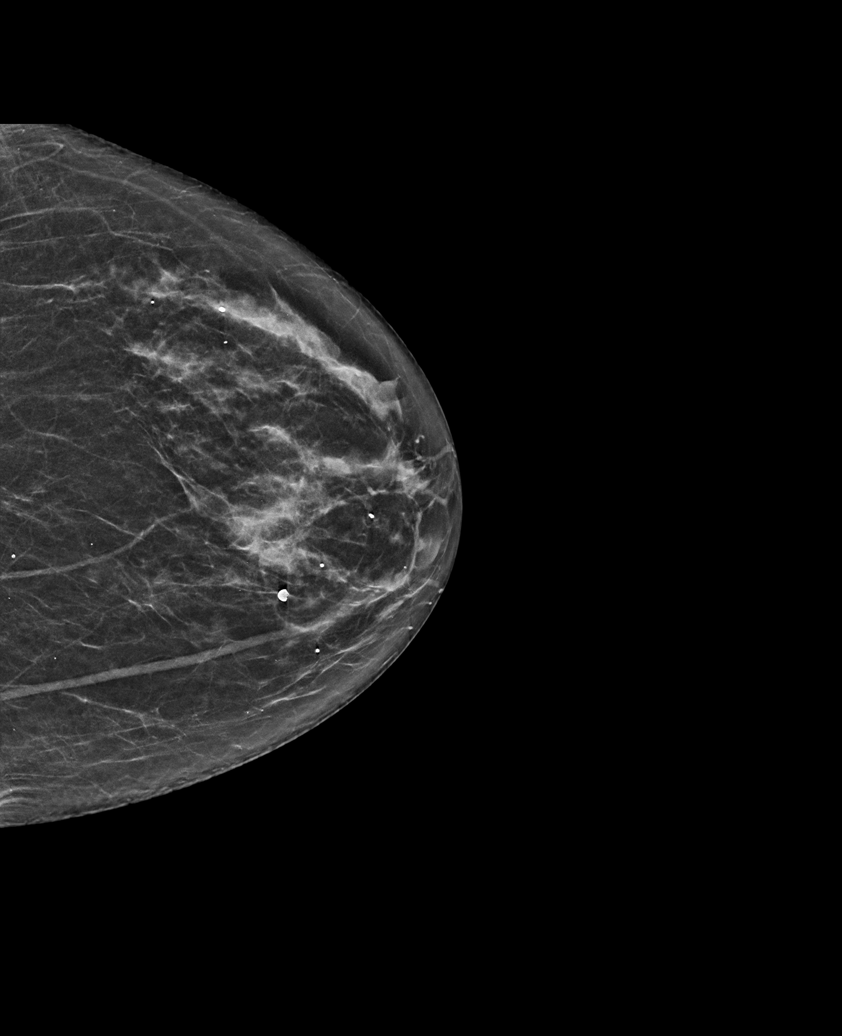

[L CC synth-2D (2 of 2)]
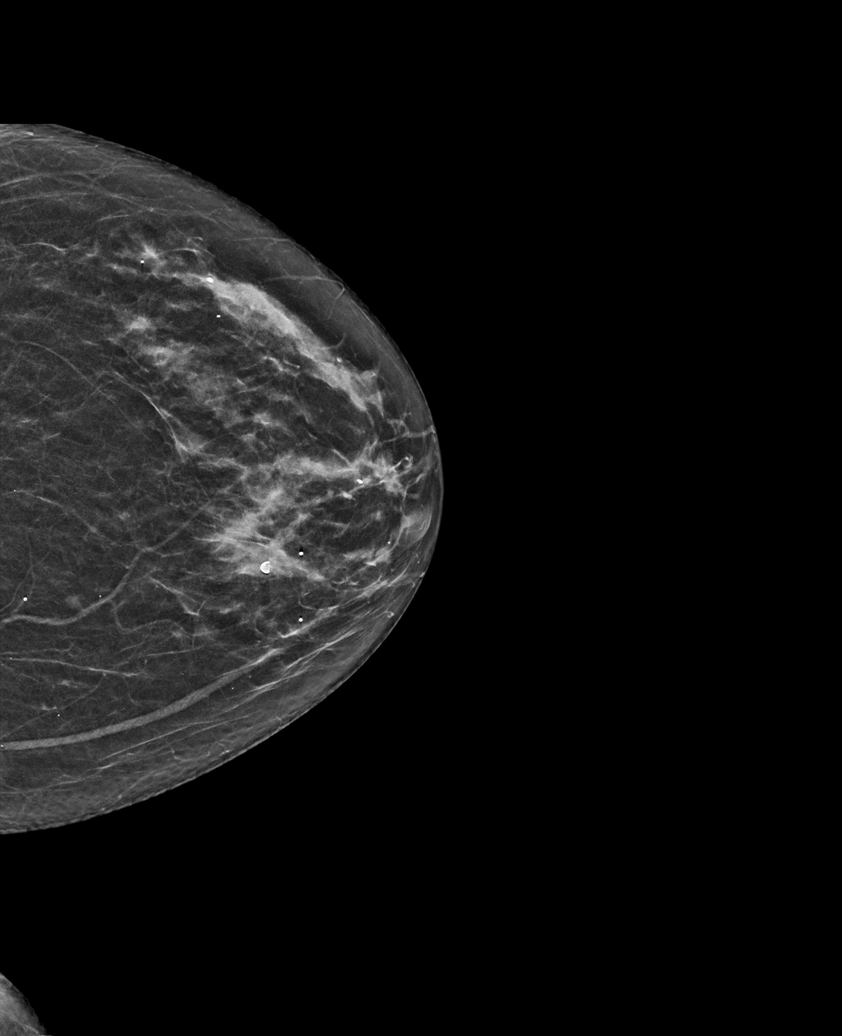

[R MLO tomo · tomo slice 29/58.0]
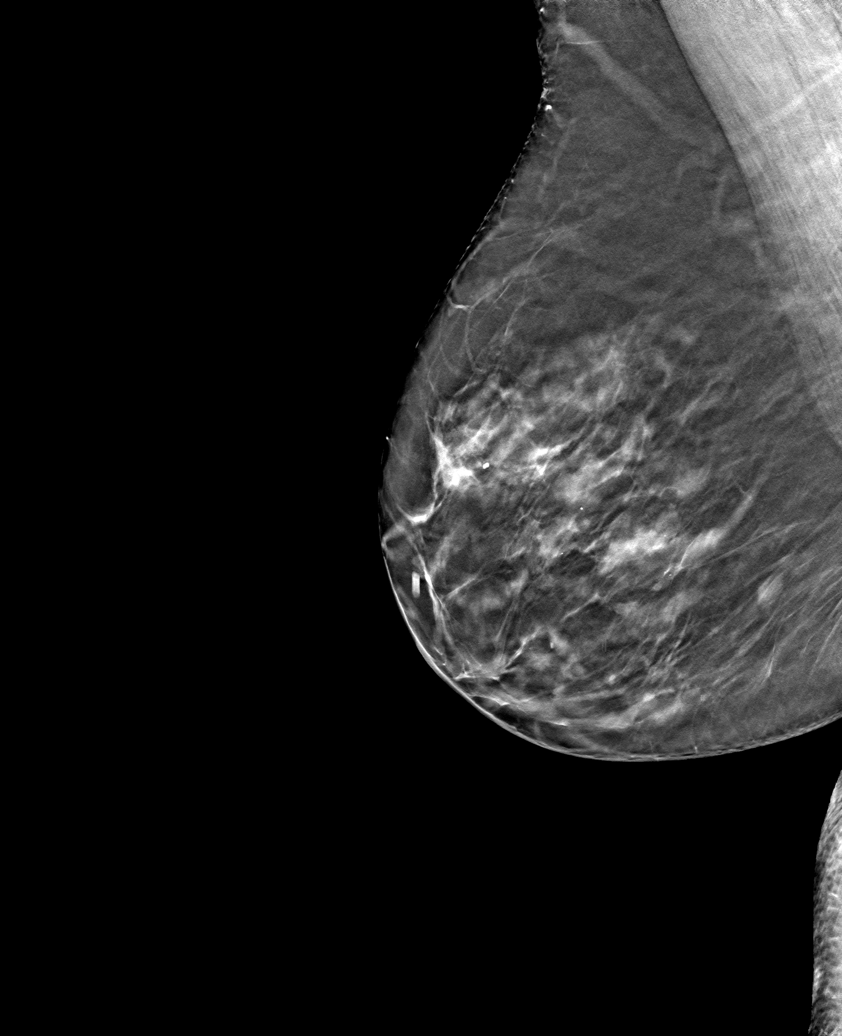

[6 of 30 positions shown; findings below may reference images not displayed]

ACR Breast Density Category c: The breast tissue is heterogeneously
dense, which may obscure small masses.
FINDINGS: There are no findings suspicious for malignancy.
IMPRESSION: No mammographic evidence of malignancy. A result letter of this
screening mammogram will be mailed directly to the patient.

RECOMMENDATION:
Screening mammogram in one year. (Code:[V2])

BI-RADS CATEGORY  1: Negative.

## 2020-12-23 ENCOUNTER — Ambulatory Visit: Payer: PPO

## 2021-01-12 DIAGNOSIS — M5416 Radiculopathy, lumbar region: Secondary | ICD-10-CM | POA: Diagnosis not present

## 2021-01-16 ENCOUNTER — Telehealth: Payer: Self-pay

## 2021-01-16 DIAGNOSIS — E782 Mixed hyperlipidemia: Secondary | ICD-10-CM

## 2021-01-16 NOTE — Telephone Encounter (Signed)
-----   Message from Rockne Menghini, Pender sent at 01/16/2021  1:48 PM EST ----- Can you call this patient and see if she is still taking the rosuvastatin.  If so we need to have her come in for labs.  (if not then we'll note it in her chart - no ASCVD or familial, so can't use the newer products)  Thank you! K ----- Message ----- From: Rockne Menghini, RPH-CPP Sent: 01/16/2021  12:00 AM EST To: Rockne Menghini, RPH-CPP  Needs lipid labs if still taking rosuvastatin biw

## 2021-01-16 NOTE — Telephone Encounter (Signed)
Called and lmomed the pt to call us back and to complete fasting labs if taking rosuvastatin

## 2021-01-20 DIAGNOSIS — E782 Mixed hyperlipidemia: Secondary | ICD-10-CM | POA: Diagnosis not present

## 2021-01-20 DIAGNOSIS — I10 Essential (primary) hypertension: Secondary | ICD-10-CM | POA: Diagnosis not present

## 2021-01-20 DIAGNOSIS — I2510 Atherosclerotic heart disease of native coronary artery without angina pectoris: Secondary | ICD-10-CM | POA: Diagnosis not present

## 2021-01-21 LAB — LIPID PANEL
Chol/HDL Ratio: 3.1 ratio (ref 0.0–4.4)
Cholesterol, Total: 208 mg/dL — ABNORMAL HIGH (ref 100–199)
HDL: 68 mg/dL (ref >39)
LDL Chol Calc (NIH): 119 mg/dL — ABNORMAL HIGH (ref 0–99)
Triglycerides: 122 mg/dL (ref 0–149)
VLDL Cholesterol Cal: 21 mg/dL (ref 5–40)

## 2021-01-21 LAB — COMPREHENSIVE METABOLIC PANEL
ALT: 15 IU/L (ref 0–32)
AST: 17 IU/L (ref 0–40)
Albumin/Globulin Ratio: 1.5 (ref 1.2–2.2)
Albumin: 4.1 g/dL (ref 3.8–4.8)
Alkaline Phosphatase: 94 IU/L (ref 44–121)
BUN/Creatinine Ratio: 13 (ref 12–28)
BUN: 15 mg/dL (ref 8–27)
Bilirubin Total: 0.2 mg/dL (ref 0.0–1.2)
CO2: 21 mmol/L (ref 20–29)
Calcium: 9.1 mg/dL (ref 8.7–10.3)
Chloride: 99 mmol/L (ref 96–106)
Creatinine, Ser: 1.14 mg/dL — ABNORMAL HIGH (ref 0.57–1.00)
Globulin, Total: 2.8 g/dL (ref 1.5–4.5)
Glucose: 82 mg/dL (ref 65–99)
Potassium: 4.5 mmol/L (ref 3.5–5.2)
Sodium: 136 mmol/L (ref 134–144)
Total Protein: 6.9 g/dL (ref 6.0–8.5)
eGFR: 52 mL/min/{1.73_m2} — ABNORMAL LOW (ref >59)

## 2021-01-24 ENCOUNTER — Encounter: Payer: Self-pay | Admitting: Gastroenterology

## 2021-02-09 DIAGNOSIS — R519 Headache, unspecified: Secondary | ICD-10-CM | POA: Diagnosis not present

## 2021-02-09 DIAGNOSIS — M5136 Other intervertebral disc degeneration, lumbar region: Secondary | ICD-10-CM | POA: Diagnosis not present

## 2021-02-09 DIAGNOSIS — M5416 Radiculopathy, lumbar region: Secondary | ICD-10-CM | POA: Diagnosis not present

## 2021-02-09 DIAGNOSIS — M542 Cervicalgia: Secondary | ICD-10-CM | POA: Diagnosis not present

## 2021-02-17 ENCOUNTER — Encounter: Payer: Self-pay | Admitting: Cardiovascular Disease

## 2021-02-17 ENCOUNTER — Ambulatory Visit: Payer: PPO | Admitting: Cardiovascular Disease

## 2021-02-17 ENCOUNTER — Other Ambulatory Visit: Payer: Self-pay

## 2021-02-17 DIAGNOSIS — Z79899 Other long term (current) drug therapy: Secondary | ICD-10-CM

## 2021-02-17 DIAGNOSIS — K838 Other specified diseases of biliary tract: Secondary | ICD-10-CM

## 2021-02-17 DIAGNOSIS — I2510 Atherosclerotic heart disease of native coronary artery without angina pectoris: Secondary | ICD-10-CM | POA: Diagnosis not present

## 2021-02-17 DIAGNOSIS — E785 Hyperlipidemia, unspecified: Secondary | ICD-10-CM

## 2021-02-17 DIAGNOSIS — M5090 Cervical disc disorder, unspecified, unspecified cervical region: Secondary | ICD-10-CM | POA: Diagnosis not present

## 2021-02-17 DIAGNOSIS — I10 Essential (primary) hypertension: Secondary | ICD-10-CM

## 2021-02-17 DIAGNOSIS — Z9049 Acquired absence of other specified parts of digestive tract: Secondary | ICD-10-CM

## 2021-02-17 MED ORDER — HYDRALAZINE HCL 25 MG PO TABS: 25.0000 mg | ORAL_TABLET | Freq: Three times a day (TID) | ORAL | 3 refills | Status: DC

## 2021-02-17 MED ORDER — SPIRONOLACTONE 25 MG PO TABS: 12.5000 mg | ORAL_TABLET | Freq: Every day | ORAL | 3 refills | Status: DC

## 2021-02-17 NOTE — Progress Notes (Signed)
Cardiology Office Note    Date:  02/19/2021   ID:  Robin Nashayla, Arellano [birth year], MRN [id number]  PCP:  Sandi Mariscal, MD  Cardiologist:  Shelva Majestic, MD   F/U evaluation initally referred by Dr.Yun Nancy Fetter at [facility] at [place]  History of Present Illness:  Robin Arellano is a 70 y.o. female who presents for 8-monthfollow-up cardiology evaluation.  I had seen remotely Robin Arellano early 1994 with atypical chest pain. She was  eval[facility] in September 22, 2018 [facility] with complaints of intermittent chest pain has been occurring off and on over the past year.  Prior to that evaluation, she was evaluated by her PCP and was told that she may have had an old heart attack based on her ECG.  During her ER evaluation she was significantly hypertensive with a blood pressure 187/100.  A chest x-ray did not show any acute abnormalities.  She had experienced some transient left facial numbness for 5 days associated with some dizziness and a CT was done which revealed atrophy with small vessel chronic ischemic changes of deep cerebral white matter without acute intracranial abnormalities.  [facility] [place] by Dr. STheressa Millardand she is felt to have prediabetes.  Lipid studies were increased with a total cholesterol of 319, triglycerides 209, LDL cholesterol 224, and she was told to initiate Crestor 10 mg which she has not yet started.  Her sedimentation rate was elevated at 64.  ANA was negative.  RF was normal.  Thyroid studies were normal.  Because of her recent symptomatology, she was referred for cardiology evaluation.  When I saw her in November 2019 she complained of some intermittent chest discomfort which occurs in the center of her chest and seems to radiate to her left shoulder. It is aggravated when she turns a certain way and may last for hours and then ultimately resolves on its own.  She was told that  this may be from "Iinside shingles."   She denied any exertional symptomatology.  Additional history is notable for chronic bilateral low back pain with bilateral sciatica.  She also  has fibromyalgia and osteoarthritis.  Has a history of anxiety and depression.  During my evaluation, I reviewed laboratory from her primary physician which suggested a high likelihood for familial hyperlipidemia with a total cholesterol at 319, LDL cholesterol at 224, triglycerides of 209, and HDL of 53.  With her recurrent chest pain which most likely had musculoskeletal features and her significant hyperlipidemia I recommended she undergo coronary CT a to evaluate potential coronary atherosclerosis and recommended an echo Doppler evaluation particularly with her hypertensive history.  The echo Doppler study was done on October 14, 2018 and showed a normal LV function and normal LV strain with an EF of 60 to 65%.  There was mild aortic valve sclerosis without stenosis, mild MR, and a mildly thickened atrial septum consistent with lipomatous hypertrophy.  She underwent coronary CTA on November 06, 2018 which showed moderate coronary plaque with moderate tubular atherosclerosis in the proximal RCA and mid RCA in the 50 to 69% range.  Left main coronary artery had mild plaque less than 25%.  Her LAD had mild proximal plaque in the 25 to 49% range, also had a moderate long segment of atherosclerotic plaque in the mid LAD just beyond the second diagonal vessel of 50 to 69%.  She was felt to have possible severe  stenosis in the distal LAD with 70 to 99% plaque.  She had a small caliber ramus intermediate vessel.  The circumflex had mild to moderate plaque of 25 to 49% proximally, and then 50 to 69% stenosis.  Subsequent FFR analysis not reveal any significant flow-limiting stenosis with the exception of the distal LAD where the FFR was 0.57.   When I saw her November 20, 2018 she denied any classic exertional chest pain.  Her chest  pain has been recurring and is described as being sharp and knifelike.  Typically it is of short duration.  She also has recently started rosuvastatin 20 mg and has begun to notice some calf discomfort attributed to initiation of statin therapy.    With her abnormal coronary CT angiogram definitive cardiac catheterization was recommended.  She underwent this catheterization by me on November 25, 2018 which revealed mild multivessel nonobstructive CAD with narrowings no greater than 25% in the LAD, circumflex marginal, and dominant RCA.  Medical therapy was recommended with aggressive lipid-lowering therapy with target LDL less than 70.  It was felt that the patient's chest pain had atypical features and most likely was nonischemic.  After her catheterization she ultimately underwent neurologic evaluation and subsequent MRI of her cervical spine which showed mild multilevel degenerative spondylolisthesis in the cervical spine with associated widespread cervical facet arthropathy.  There was superimposed cervical disc and endplate degeneration maximal at C5 and 6 but no associated cervical spinal stenosis.  There was moderate neuroforaminal stenosis at the right C4 right C5 bilateral C6 and left C7 nerve levels.  He has been seeing Dr. Maryjean Ka for pain management and had undergone epidural injection.  She also sees Dr. Vertell Limber.  She denies any exertional chest pain.  She has continued to take rosuvastatin for hyperlipidemia.  She was evaluated by me in a telemedicine encounter on February 16, 2020.  Since her prior evaluation she had undergone successful cervical disc surgery by Dr. Vertell Limber.  Prior to surgery she was having severe pain bilaterally in her arms.  This has essentially resolved.  She is now able to be more active.  Prior to her surgery she could not walk well due to significant pain.  She also tells me that she does have issues with her lower back and in August will be undergoing an MRI and may also be  undergoing an injection.  If symptoms progress she may require future low back surgery.  At her telemedicine visit, she denied any chest pain.  Apparently she had only been taking  metoprolol tartrate 25 mg once a day instead of as prescribed twice a day.  She has noticed some blood pressure elevation.  She denies any swelling.  She denies any anginal symptoms.  During her recent injection her blood pressure did significantly increase.  During that evaluation I suggested changing to metoprolol succinate initially 25 mg but her dose may need to be further titrated to 50 mg.  At that time she was on rosuvastatin 5 mg with target LDL less than 70.  She was hospitalized for 6 days in May 2021 with a diagnosis of acute pancreatitis and common bile duct dilatation.  I last saw her in July 2021 at which time she remained stable from a cardiac standpoint and was scheduled to see  Dr. Barry Dienes at [facility] surgery for possible future cholecystectomy.  During her hospitalization, she was taken off rosuvastatin and also was taken off metoprolol succinate.  She denied chest pain, PND orthopnea.  During that evaluation, her blood pressure was elevated and I recommended resumption of metoprolol tartrate 25 mg twice a day.  Also with discontinuance of rosuvastatin I recommended initiation of Zetia 10 mg daily.  She underwent successful cholecystectomy on July 26, 2020.  She tolerated this well.  She had been off Crestor since May.    When I saw  her on September 08, 2020 her blood pressure was significantly elevated at 190/78 and on repeat by me was 180/78.  She was on amlodipine 10 mg and lisinopril 5 mg and had only been taking metoprolol 12.5 mg twice a day.  At that time I recommended she discontinue lisinopril and started her on olmesartan 20 mg.  I recommended follow-up laboratory.   Laboratory in December showed a total cholesterol 262, triglycerides 197, LDL 160 and HDL 36.  Creatinine was 1.15.  TSH was  normal.  She was subsequently seen by Joslyn Hy, [facility] on November 24, 2020.  I last saw her on December 19, 2020.  She was felt not eligible for PCSK9 inhibition due to her nonobstructive CAD where she was only found to have mild multivessel disease with up to 25% narrowings.  She was rechallenged with Crestor 5 mg 3 times per week.  She dramatically changed her diet and stopped eating red meat and has been having predominantly chicken and salads.  Her blood pressure has improved but remained elevated and at home is in the 160-170 range.  During that evaluation with her continued blood pressure elevation I recommended further titration of olmesartan to 40 mg daily and increased her metoprolol to 25 mg twice a day.  She was rechallenged with low-dose rosuvastatin and was tolerating this fairly well.  Presently, she denies any chest pain or shortness of breath.  However she has had issues with pain in her lower back, no significant increased stress, and this has been associated with a blood pressure lability.  She presents for evaluation.  Past Medical History:  Diagnosis Date  . ADHD   . Anxiety   . Back pain   . Chronic female pelvic pain   . Coronary artery disease    mild, non-obstructive 11/2018  . Depression   . Fibromyalgia   . H/O leukocytosis   . Headache   . Hypertension   . MI (myocardial infarction) ([place])    Pt states she did not have a MI- EKG was normal, was GERD  . Osteoarthritis   . Ovarian cyst, right   . Post-operative nausea and vomiting   . SVD (spontaneous vaginal delivery)    x 2  . Vitamin D deficiency     Past Surgical History:  Procedure Laterality Date  . ABDOMINAL HYSTERECTOMY  1994   TAH.BSO  . ANTERIOR CERVICAL DECOMP/DISCECTOMY FUSION  2019  . APPENDECTOMY  1975  . BACK SURGERY    . BIOPSY  05/12/2020   Procedure: BIOPSY;  Surgeon: Milus Banister, MD;  Location: [facility];  Service: Endoscopy;;  . CARDIAC CATHETERIZATION  2020  .  CHOLECYSTECTOMY N/A 07/21/2020   Procedure: LAPAROSCOPIC CHOLECYSTECTOMY WITH INTRAOPERATIVE CHOLANGIOGRAM;  Surgeon: [name], MD;  Location: [facility];  Service: General;  Laterality: N/A;  . COLONOSCOPY  08/12/2017   Hx polyp/Jacobs  . ESOPHAGOGASTRODUODENOSCOPY (EGD) WITH PROPOFOL N/A 05/12/2020   Procedure: ESOPHAGOGASTRODUODENOSCOPY (EGD) WITH PROPOFOL;  Surgeon: Milus Banister, MD;  Location: [facility];  Service: Endoscopy;  Laterality: N/A;  . EUS N/A 05/12/2020   Procedure: UPPER ENDOSCOPIC ULTRASOUND (EUS) RADIAL;  Surgeon: Milus Banister, MD;  Location: [facility];  Service: Endoscopy;  Laterality: N/A;  . KNEE SURGERY Bilateral 1996   x 2 - arthroscopic  . LEFT HEART CATH AND CORONARY ANGIOGRAPHY N/A 11/25/2018   Procedure: LEFT HEART CATH AND CORONARY ANGIOGRAPHY;  Surgeon: Troy Sine, MD;  Location: [facility];  Service: Cardiovascular;  Laterality: N/A;  . PELVIC LAPAROSCOPY  1989   W LYSIS OF ADHESIONS/L SALPINGONEOSTOMY  . WISDOM TOOTH EXTRACTION      Current Medications: Outpatient Medications Prior to Visit  Medication Sig Dispense Refill  . ALPRAZolam (XANAX) 0.5 MG tablet Take 0.5 mg by mouth at bedtime as needed for anxiety or sleep.     Marland Kitchen amLODipine (NORVASC) 10 MG tablet TAKE 1 TABLET(10 MG) BY MOUTH DAILY 90 tablet 3  . Ascorbic Acid (VITAMIN C) 1000 MG tablet Take 1,000 mg by mouth daily.    . butalbital-acetaminophen-caffeine (FIORICET, ESGIC) 50-325-40 MG tablet Take 1 tablet by mouth every 6 (six) hours as needed for headache.   0  . Cholecalciferol (VITAMIN D) 50 MCG (2000 UT) tablet Take 2,000 Units by mouth daily.     Marland Kitchen ezetimibe (ZETIA) 10 MG tablet TAKE 1 TABLET(10 MG) BY MOUTH DAILY 90 tablet 3  . HYDROcodone-acetaminophen (NORCO) 10-325 MG tablet Take 1 tablet by mouth 2 (two) times daily as needed for moderate pain.     . metoprolol tartrate (LOPRESSOR) 25 MG tablet Take 1 tablet (25 mg total) by mouth in the morning AND 0.5 tablets  (12.5 mg total) every evening. 135 tablet 3  . Multiple Vitamin (MULTIVITAMIN WITH MINERALS) TABS tablet Take 1 tablet by mouth daily.    Marland Kitchen olmesartan (BENICAR) 40 MG tablet Take 1 tablet (40 mg total) by mouth daily. 90 tablet 3  . omeprazole (PRILOSEC) 40 MG capsule TAKE 1 CAPSULE(40 MG) BY MOUTH DAILY 30 capsule 1  . rosuvastatin (CRESTOR) 5 MG tablet Take 1 tablet by mouth up to 3 times per week as tolerated 30 tablet 3  . valACYclovir (VALTREX) 500 MG tablet TAKE 1 TABLET BY MOUTH TWICE DAILY FOR 3 TO 5 DAYS THEN TAKE DAILY AS NEEDED (Patient taking differently: Take 500 mg by mouth daily as needed (shingles).) 30 tablet 0  . zolpidem (AMBIEN) 10 MG tablet Take 10 mg by mouth at bedtime as needed for sleep.      No facility-administered medications prior to visit.     Allergies:   Latex   Social History   Socioeconomic History  . Marital status: Married    Spouse name: don  . Number of children: 2  . Years of education: College  . Highest education level: Not on file  Occupational History  . Occupation: Realtor  Tobacco Use  . Smoking status: Former Smoker    Packs/day: 0.15    Years: 20.00    Pack years: 3.00    Types: Cigarettes    Quit date: 11/12/1998    Years since quitting: 22.2  . Smokeless tobacco: Never Used  Vaping Use  . Vaping Use: Never used  Substance and Sexual Activity  . Alcohol use: No  . Drug use: No  . Sexual activity: Not Currently    Birth control/protection: Post-menopausal, Surgical    Comment: HYSTERECTOMY  Other Topics Concern  . Not on file  Social History Narrative   Lives at home with her husband Timmothy Sours   Right handed   Caffeine: unsweet tea, 2 glasses daily   Social Determinants of Health  Financial Resource Strain: Not on file  Food Insecurity: Not on file  Transportation Needs: Not on file  Physical Activity: Not on file  Stress: Not on file  Social Connections: Not on file    Social history is notable in that she has had  several marriages and currently has been married for 16 years.  She has 2 children, 2 grandchildren.  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  There is a higher tobacco history, she quit in 2002.  She does yoga intermittently.  Family History:  The patient's family history includes Aneurysm in her father; COPD in her brother; Cancer in her mother; Heart disease in her paternal grandfather; Other in her sister and sister; Skin cancer in her brother and brother.   Her mother died at age 35.  Her father died at age 20 and had blood clots.  A brother died at age 96 and was exposed to agent orange.  She has a brother age 47 with cancer.  She has 5 sisters.   ROS General: Negative; No fevers, chills, or night sweats;  HEENT: Negative; No changes in vision or hearing, sinus congestion, difficulty swallowing Pulmonary: Negative; No cough, wheezing, shortness of breath, hemoptysis Cardiovascular: See HPI GI: Recent pancreatitis and cholecystectomy GU: History of UTIs Musculoskeletal: Chronic low back pain, osteo-arthritis, fibromyalgia Hematologic/Oncology: Negative; no easy bruising, bleeding Endocrine: Negative; no heat/cold intolerance; no diabetes Neuro: Negative; no changes in balance, headaches Skin: Negative; No rashes or skin lesions Psychiatric: Admits to increased stress Sleep: Negative; No snoring, daytime sleepiness, hypersomnolence, bruxism, restless legs, hypnogognic hallucinations, no cataplexy Other comprehensive 14 point system review is negative.   PHYSICAL EXAM:   VS:  BP (!) 200/82 (BP Location: Right Arm, Patient Position: Sitting, Cuff Size: Normal)   Pulse (!) 56   Ht 5' 4.5" (1.638 m)   Wt 161 lb (73 kg)   LMP  (LMP Unknown)   BMI 27.21 kg/m     Repeat blood pressure by me was 190/84  Wt Readings from Last 3 Encounters:  02/17/21 161 lb (73 kg)  12/19/20 158 lb (71.7 kg)  09/08/20 159 lb (72.1 kg)    General: Alert, oriented, no distress.  Skin: normal  turgor, no rashes, warm and dry HEENT: Normocephalic, atraumatic. Pupils equal round and reactive to light; sclera anicteric; extraocular muscles intact;  Nose without nasal septal hypertrophy Mouth/Parynx benign; Mallinpatti scale 2 Neck: No JVD, no carotid bruits; normal carotid upstroke;  Lungs: clear to ausculatation and percussion; no wheezing or rales Chest wall: without tenderness to palpitation Heart: PMI not displaced, RRR, s1 s2 normal, 1/6 systolic murmur, no diastolic murmur, no rubs, gallops, thrills, or heaves Abdomen: soft, nontender; no hepatosplenomehaly, BS+; abdominal aorta nontender and not dilated by palpation. Back: no CVA tenderness Pulses 2+ Musculoskeletal: Cervical disc disease; no myalgias Extremities: no clubbing cyanosis or edema, Homan's sign negative  Neurologic: grossly nonfocal; Cranial nerves grossly wnl Psychologic: Normal mood and affect   Studies/Labs Reviewed:   ECG (independently read by me): Sinus bradycardia at 56; Mild ST changes  February 2022 ECG (independently read by me): Sinus bradycardia 53 bpm.  No ectopy.  Normal intervals     July 20, 2021ECG (independently read by me): Normal sinus rhythm at 66 bpm.  Left atrial enlargement.  Nonspecific ST changes.  QTc interval 461 Robin.  March 2020 ECG (independently read by me): Sinus bradycardia 59 bpm.  Normal intervals.  No ectopy.  November 20, 2018 EKG:  EKG is  ordered today. Sinus Bradycardia at 58;Q wave III, no STT changes  October 06, 2018 ECG (independently read by me): Normal sinus rhythm at 66 bpm.  Nondiagnostic Q waves in lead III and aVF.  Normal intervals.  No ectopy.  No ST segment changes.  Recent Labs: BMP Latest Ref Rng & Units 01/20/2021 10/13/2020 07/13/2020  Glucose 65 - 99 mg/dL 82 89 98  BUN 8 - 27 mg/dL _0 Creatinine 0.57 - 1.00 mg/dL 1.14(H) 1.15(H) 1.23(H)  BUN/Creat Ratio 12 - _1 -  Sodium 134 - 144 mmol/L 136 136 138  Potassium 3.5 - 5.2 mmol/L 4.5 4.5  4.6  Chloride 96 - 106 mmol/L 99 98 104  CO2 20 - 29 mmol/L _2 Calcium 8.7 - 10.3 mg/dL 9.1 9.8 9.2     Hepatic Function Latest Ref Rng & Units 01/20/2021 10/13/2020 07/13/2020  Total Protein 6.0 - 8.5 g/dL 6.9 7.5 6.5  Albumin 3.8 - 4.8 g/dL 4.1 4.4 3.4(L)  AST 0 - 40 IU/L _3 ALT 0 - 32 IU/L _4 Alk Phosphatase 44 - 121 IU/L 94 97 64  Total Bilirubin 0.0 - 1.2 mg/dL 0.2 0.3 0.8  Bilirubin, Direct 0.0 - 0.2 mg/dL - - -    CBC Latest Ref Rng & Units 10/13/2020 07/13/2020 04/20/2020  WBC 3.4 - 10.8 x10E3/uL 12.7(H) 11.1(H) 11.8(H)  Hemoglobin 11.1 - 15.9 g/dL 12.6 12.0 11.6(L)  Hematocrit 34.0 - 46.6 % 36.1 37.5 35.0(L)  Platelets 150 - 450 x10E3/uL 388 333 465.0(H)   Lab Results  Component Value Date   MCV 97 10/13/2020   MCV 103.9 (H) 07/13/2020   MCV 96.7 04/20/2020   Lab Results  Component Value Date   TSH 0.860 10/13/2020   No results found for: HGBA1C   BNP No results found for: BNP  ProBNP No results found for: PROBNP   Lipid Panel     Component Value Date/Time   CHOL 208 (H) 01/20/2021 1230   TRIG 122 01/20/2021 1230   HDL 68 01/20/2021 1230   CHOLHDL 3.1 01/20/2021 1230   LDLCALC 119 (H) 01/20/2021 1230     RADIOLOGY: No results found.   Additional studies/ records that were reviewed today include:  I reviewed the records from the emergency room.  I reviewed the records from [facility] by Arville Go.   ------------------------------------------------------------------- 10/14/2018 ECHO Study Conclusions  - Left ventricle: Average global longitudinal LV strain is normal   at -18.7% The cavity size was normal. Systolic function was   normal. The estimated ejection fraction was in the range of 60%   to 65%. Wall motion was normal; there were no regional wall   motion abnormalities. The study is not technically sufficient to   allow evaluation of LV diastolic function. - Aortic valve: Trileaflet;  mildly thickened, mildly calcified   leaflets. - Mitral valve: There was mild regurgitation. - Atrial septum: There was increased thickness of the septum,   consistent with lipomatous hypertrophy.   ASSESSMENT:    1. Accelerated hypertension   2. Essential hypertension   3. Medication management   4. CAD in native artery   5. Cervical disc disease   6. Hyperlipidemia with target LDL less than 70   7. History of cholecystectomy   8. Common bile duct dilation     PLAN:  Robin. Robin Arellano is a 70 year old female who had experienced intermittent episodes of somewhat atypical chest  discomfort.  Her chest pain has been occurring off and on for the past year typically is nonexertional.  Her ECG shows normal sinus rhythm.  She has small nondiagnostic inferior Q waves.  I do not believe she has had a prior myocardial infarction.  She has had issues with stage II hypertension and her blood pressure was significantly elevated at her emergency room evaluation.  When I initially saw her I recommended the addition of amlodipine to her medical regimen. An echo Doppler study demonstrated normal systolic without wall motion abnormalities and with normal global longitudinal strain.  There was evidence for mild aortic sclerosis without stenosis, mild MR, and probable lipomatous hypertrophy of her atrial septum.  She  continued to experience sharp stabbing-like chest pain and previously on physical examination today she had definite tenderness to her costochondral region pectoral region suggesting her chest pain is most likely of musculoskeletal etiology.  Her CT angiogram raised the possibility of at least moderate multivessel coronary atherosclerosis and her distal LAD lesion was felt to be FFR significant.  Definitive cardiac catheterization did not  demonstrate high-grade obstructive disease with no narrowings greater than 25% in the LAD circumflex marginal and dominant RCA.  She has subsequently been found to  have multilevel cervical degenerative spondylolisthesis as well as cervical disc disease as noted above.  She underwent successful cervical disc surgery by Dr. Vertell Limber with significant benefit in improvement in her severe bilateral arm discomfort.  She subsequently had an episode of pancreatitis with dilated bile duct and underwent successful cholecystectomy on July 26, 2020.  Over the last several months, she has had blood pressure lability.  Her medications have been adjusted and titrated.  Blood pressure today continues to be increased despite taking amlodipine 10 mg daily, metoprolol tartrate 25 mg in the morning and 12.5 mg in the evening, olmesartan 40 mg daily.  Presently, I have recommended initiation of hydralazine 25 mg every 8 hours as well as spironolactone 12.5 mg daily.  I am scheduling her for renal duplex study to evaluate for potential renal vascular etiology and make certain there is no renal artery stenosis.  She may ultimately require 24-hour urine analysis to assess for pheochromocytoma.  She is tolerating the rosuvastatin 3 days/week and I have recommended slight titration to every other day at the 5 mg dose and she continues to be on Zetia 10 mg daily.  I will recheck it be met in 2 weeks with the addition of spironolactone to her regimen.  She will continue to monitor her blood pressure.  I will see her in 6 weeks for reevaluation or sooner as needed.   Medication Adjustments/Labs and Tests Ordered: Current medicines are reviewed at length with the patient today.  Concerns regarding medicines are outlined above.  Medication changes, Labs and Tests ordered today are listed in the Patient Instructions below. Patient Instructions  Medication Instructions:  START hydralazine 25 mg every 8 hours (3 times daily START spironolactone 12.5 mg daily  *If you need a refill on your cardiac medications before your next appointment, please call your pharmacy*   Lab Work: Please return for  labs in 2 weeks ([facility])  Our in office lab hours are Monday-Friday 8:00-4:00, closed for lunch 12:45-1:45 pm.  No appointment needed.  Testing/Procedures: Your physician has requested that you have a renal artery duplex. During this test, an ultrasound is used to evaluate blood flow to the kidneys. Allow one hour for this exam. Do not eat after midnight the day before  and avoid carbonated beverages. Take your medications as you usually do.  Follow-Up: At [facility], you and your health needs are our priority.  As part of our continuing mission to provide you with exceptional heart care, we have created designated Provider Care Teams.  These Care Teams include your primary Cardiologist (physician) and Advanced Practice Providers (APPs -  Physician Assistants and Nurse Practitioners) who all work together to provide you with the care you need, when you need it.  We recommend signing up for the patient portal called "MyChart".  Sign up information is provided on this After Visit Summary.  MyChart is used to connect with patients for Virtual Visits (Telemedicine).  Patients are able to view lab/test results, encounter notes, upcoming appointments, etc.  Non-urgent messages can be sent to your provider as well.   To learn more about what you can do with MyChart, go to [email or web address].    Your next appointment:   2 week(s) with pharmacist (blood pressure) 6-8 weeks with Dr. Claiborne Billings        Signed, Shelva Majestic, MD  02/19/2021 12:52 PM    [facility] [address], [place], [place], [place]  [address] Phone: [phone]

## 2021-02-17 NOTE — Patient Instructions (Signed)
Medication Instructions:  START hydralazine 25 mg every 8 hours (3 times daily START spironolactone 12.5 mg daily  *If you need a refill on your cardiac medications before your next appointment, please call your pharmacy*   Lab Work: Please return for labs in 2 weeks (BMET)  Our in office lab hours are Monday-Friday 8:00-4:00, closed for lunch 12:45-1:45 pm.  No appointment needed.  Testing/Procedures: Your physician has requested that you have a renal artery duplex. During this test, an ultrasound is used to evaluate blood flow to the kidneys. Allow one hour for this exam. Do not eat after midnight the day before and avoid carbonated beverages. Take your medications as you usually do.  Follow-Up: At [facility], you and your health needs are our priority.  As part of our continuing mission to provide you with exceptional heart care, we have created designated Provider Care Teams.  These Care Teams include your primary Cardiologist (physician) and Advanced Practice Providers (APPs -  Physician Assistants and Nurse Practitioners) who all work together to provide you with the care you need, when you need it.  We recommend signing up for the patient portal called "MyChart".  Sign up information is provided on this After Visit Summary.  MyChart is used to connect with patients for Virtual Visits (Telemedicine).  Patients are able to view lab/test results, encounter notes, upcoming appointments, etc.  Non-urgent messages can be sent to your provider as well.   To learn more about what you can do with MyChart, go to [email or web address].    Your next appointment:   2 week(s) with pharmacist (blood pressure) 6-8 weeks with Dr. Claiborne Billings

## 2021-02-19 ENCOUNTER — Encounter: Payer: Self-pay | Admitting: Cardiovascular Disease

## 2021-02-22 ENCOUNTER — Other Ambulatory Visit: Payer: Self-pay

## 2021-02-22 ENCOUNTER — Ambulatory Visit (HOSPITAL_COMMUNITY)
Admission: RE | Admit: 2021-02-22 | Discharge: 2021-02-22 | Disposition: A | Discharge status: Home / Self Care | Payer: PPO | Source: Ambulatory Visit | Attending: Cardiology | Admitting: Cardiology

## 2021-02-22 DIAGNOSIS — I10 Essential (primary) hypertension: Secondary | ICD-10-CM | POA: Diagnosis not present

## 2021-03-13 ENCOUNTER — Other Ambulatory Visit: Payer: Self-pay

## 2021-03-13 ENCOUNTER — Encounter: Payer: Self-pay | Admitting: Gastroenterology

## 2021-03-13 ENCOUNTER — Ambulatory Visit (AMBULATORY_SURGERY_CENTER): Payer: Self-pay

## 2021-03-13 VITALS — Ht 63.0 in | Wt 164.0 lb

## 2021-03-13 DIAGNOSIS — Z86010 Personal history of colonic polyps: Secondary | ICD-10-CM

## 2021-03-13 MED ORDER — NA SULFATE-K SULFATE-MG SULF 17.5-3.13-1.6 GM/177ML PO SOLN: 1.0000 | Freq: Once | ORAL | 0 refills | Status: AC

## 2021-03-13 NOTE — Progress Notes (Signed)
No egg or soy allergy known to patient  No issues with past sedation with any surgeries or procedures Patient denies ever being told they had issues or difficulty with intubation  No FH of Malignant Hyperthermia No diet pills per patient No home 02 use per patient  No blood thinners per patient  Pt denies issues with constipation  No A fib or A flutter  EMMI video via MyChart  COVID 19 guidelines implemented in PV today with Pt and RN  Pt is fully vaccinated for Covid x 2; NO PA's for preps discussed with pt in PV today  Discussed with pt there will be an out-of-pocket cost for prep and that varies from $0 to 70 dollars  Due to the COVID-19 pandemic we are asking patients to follow certain guidelines.  Pt aware of COVID protocols and [facility] guidelines

## 2021-03-14 ENCOUNTER — Ambulatory Visit (INDEPENDENT_AMBULATORY_CARE_PROVIDER_SITE_OTHER): Payer: PPO | Admitting: Pharmacist

## 2021-03-14 VITALS — BP 170/78 | HR 49

## 2021-03-14 DIAGNOSIS — I10 Essential (primary) hypertension: Secondary | ICD-10-CM

## 2021-03-14 LAB — BASIC METABOLIC PANEL
BUN/Creatinine Ratio: 12 (ref 12–28)
BUN: 16 mg/dL (ref 8–27)
CO2: 21 mmol/L (ref 20–29)
Calcium: 9.3 mg/dL (ref 8.7–10.3)
Chloride: 100 mmol/L (ref 96–106)
Creatinine, Ser: 1.34 mg/dL — ABNORMAL HIGH (ref 0.57–1.00)
Glucose: 84 mg/dL (ref 65–99)
Potassium: 4 mmol/L (ref 3.5–5.2)
Sodium: 136 mmol/L (ref 134–144)
eGFR: 43 mL/min/{1.73_m2} — ABNORMAL LOW (ref >59)

## 2021-03-14 MED ORDER — OLMESARTAN MEDOXOMIL 40 MG PO TABS: 40.0000 mg | ORAL_TABLET | Freq: Every day | ORAL | 3 refills | Status: DC

## 2021-03-14 MED ORDER — METOPROLOL TARTRATE 25 MG PO TABS: ORAL_TABLET | ORAL | 3 refills | Status: DC

## 2021-03-14 NOTE — Progress Notes (Addendum)
Patient ID: Robin Arellano                 DOB: Sep 13, 1951                      MRN: [id number]     HPI: Robin Arellano is a 70 y.o. female referred by Dr. Claiborne Billings to HTN clinic. PMH is significant for HLD, chronic HTN, CAD, pancreatitis, back pain s/p cervical disc surgery and depression. She has had issues with blood pressure for many years. At appointment with Dr. Claiborne Billings, her blood pressure was 200/82. He added hydralazine 25mg  TID and spironolactone 12.5mg  daily. She was ordered a renal artery duplex (resulted normal) and mentioned possibly doing a 24hr urine collection for pheochromocytoma.  Patient presents today for follow up. There seems to be some discrepancy between what we thought she was taking and what she states she is taking. Patient states she is taking olmesartan 20mg  daily, not 40mg . It looks like the 20mg  tablets were the last filled at pharmacy. She also states she is taking metoprolol tartrate 25mg  daily, not BID. She states she is taking hydralaine at 11AM, 4PM and 8PM. Her biggest complaint is fatigue. She denies dizziness, lightheadedness, headache, blurred vision, SOB or swelling. States that her blood pressure at home is in the 150-160's and HR 52-62. HR in clinic today was 49, BP 170/78.  Concerned about spots on her arms that come and go. Do not hurt or itch- advised her to see a dermatologist.  Current HTN meds: amlodipine 10mg  daily, hydralazine 25mg  three times a day, metoprolol tartrate 25mg  in the AM (supposed to be taking 12.5mg  at night), olmesartan 20mg  daily (supposed to be on 40mg ) and spironolactone 12.5mg  daily.   Previously tried: lisinopril (changed to olmesartan) BP goal: <130/80  Family History: The patient's family history includes Aneurysm in her father; COPD in her brother; Cancer in her mother; Heart disease in her paternal grandfather; Other in her sister and sister; Skin cancer in her brother and brother.   Her mother died at age 69.  Her  father died at age 28 and had blood clots.  A brother died at age 83 and was exposed to agent orange.  She has a brother age 84 with cancer.  She has 5 sisters  Social History: former smoker  Diet: fish chicken, red meat once in awhile, apple, fruit, salads (grapes, olives, tomatoes, cucumbers- makes own [place] dressing) healthy choice soups  Exercise: limited due to back issues  Home BP readings: 150-160's HR 52-62 per pt report  Wt Readings from Last 3 Encounters:  03/13/21 164 lb (74.4 kg)  02/17/21 161 lb (73 kg)  12/19/20 158 lb (71.7 kg)   BP Readings from Last 3 Encounters:  02/17/21 (!) 200/82  12/19/20 (!) 184/82  09/08/20 (!) 190/78   Pulse Readings from Last 3 Encounters:  02/17/21 (!) 56  12/19/20 65  09/08/20 (!) 53    Renal function: CrCl cannot be calculated (Patient's most recent lab result is older than the maximum 21 days allowed.).  Past Medical History:  Diagnosis Date  . ADHD   . Anxiety    on meds  . Back pain   . Chronic female pelvic pain   . Coronary artery disease    mild, non-obstructive 11/2018  . Depression    on meds  . Fibromyalgia   . H/O leukocytosis   . Headache   . Hyperlipidemia    on meds  .  Hypertension    on meds  . MI (myocardial infarction) ([place])    Pt states she did not have a MI- EKG was normal, was GERD  . Osteoarthritis    on meds  . Ovarian cyst, right   . PONV (postoperative nausea and vomiting)   . Post-operative nausea and vomiting   . SVD (spontaneous vaginal delivery)    x 2  . Vitamin D deficiency     Current Outpatient Medications on File Prior to Visit  Medication Sig Dispense Refill  . ALPRAZolam (XANAX) 0.5 MG tablet Take 0.5 mg by mouth at bedtime as needed for anxiety or sleep.     Marland Kitchen amLODipine (NORVASC) 10 MG tablet TAKE 1 TABLET(10 MG) BY MOUTH DAILY 90 tablet 3  . Ascorbic Acid (VITAMIN C) 1000 MG tablet Take 1,000 mg by mouth daily.    . butalbital-acetaminophen-caffeine (FIORICET, ESGIC)  50-325-40 MG tablet Take 1 tablet by mouth every 6 (six) hours as needed for headache.   0  . Cholecalciferol (VITAMIN D) 50 MCG (2000 UT) tablet Take 2,000 Units by mouth daily.     Marland Kitchen ezetimibe (ZETIA) 10 MG tablet TAKE 1 TABLET(10 MG) BY MOUTH DAILY 90 tablet 3  . hydrALAZINE (APRESOLINE) 25 MG tablet Take 1 tablet (25 mg total) by mouth 3 (three) times daily. 270 tablet 3  . HYDROcodone-acetaminophen (NORCO) 10-325 MG tablet Take 1 tablet by mouth 2 (two) times daily as needed for moderate pain.     . metoprolol tartrate (LOPRESSOR) 25 MG tablet Take 1 tablet (25 mg total) by mouth in the morning AND 0.5 tablets (12.5 mg total) every evening. 135 tablet 3  . Multiple Vitamin (MULTIVITAMIN WITH MINERALS) TABS tablet Take 1 tablet by mouth daily.    Marland Kitchen olmesartan (BENICAR) 20 MG tablet Take 20 mg by mouth daily.    Marland Kitchen omeprazole (PRILOSEC) 40 MG capsule TAKE 1 CAPSULE(40 MG) BY MOUTH DAILY 30 capsule 1  . rosuvastatin (CRESTOR) 5 MG tablet Take 1 tablet by mouth up to 3 times per week as tolerated 30 tablet 3  . spironolactone (ALDACTONE) 25 MG tablet Take 0.5 tablets (12.5 mg total) by mouth daily. 45 tablet 3  . valACYclovir (VALTREX) 500 MG tablet TAKE 1 TABLET BY MOUTH TWICE DAILY FOR 3 TO 5 DAYS THEN TAKE DAILY AS NEEDED (Patient taking differently: Take 500 mg by mouth daily as needed (shingles).) 30 tablet 0  . zolpidem (AMBIEN) 10 MG tablet Take 10 mg by mouth at bedtime as needed for sleep.      No current facility-administered medications on file prior to visit.    Allergies  Allergen Reactions  . Cyclobenzaprine Anaphylaxis, Hives, Itching and Swelling  . Tizanidine Anaphylaxis, Hives, Itching and Swelling  . Latex Rash     Assessment/Plan:  1. Hypertension - Blood pressure above goal of <130/80. If patient's fatigue is from medications, I suspect it is either from metoprolol dose and low HR or hydralazine. Will start with decreasing metoprolol to 12.5mg  twice a day. I have asked  the patient to resume taking olmesartan 40mg  daily. I have called her pharmacy and ask them to d/c the 20mg  rx in the computer so she cannot refill by mistake. Continue amlodipine 10mg  daily, hydralazine 25mg  three times a day and spironolactone 12.5mg  daily. Will check BMP today since starting spironolactone. I will call patient tomorrow with lab results. I have asked her to space her hydralazine out a little more. 9AM, 4PM and 9PM. I have asked her  to bring her blood pressure log, cuff and medications to her next visit. She has follow up with Dr. Claiborne Billings on 5/26. We would be happy to see patient back in clinic after that if Dr. Claiborne Billings would like. Will most likely need to titrate spironolactone if labs allow.  Ramond Dial, Pharm.D, BCPS, CPP [place]  [address] [place], [place] [address]  Phone: [phone]; Fax: [phone]

## 2021-03-14 NOTE — Patient Instructions (Addendum)
It was nice to meet you!  Start taking metoprolol 12.5mg  (1/2 tablet) twice a day  Please increase your olmesartan to 40mg  daily  I will call you tomorrow with your lab results  Please bring your blood pressure cuff and log with you to your next appointment, along with all your medication bottles  Call me with any questions or concerns  Keep an eye on your HR- would like it to be >55

## 2021-03-29 ENCOUNTER — Ambulatory Visit (AMBULATORY_SURGERY_CENTER): Payer: PPO | Admitting: Gastroenterology

## 2021-03-29 ENCOUNTER — Other Ambulatory Visit: Payer: Self-pay

## 2021-03-29 ENCOUNTER — Encounter: Payer: Self-pay | Admitting: Gastroenterology

## 2021-03-29 VITALS — BP 155/62 | HR 54 | Temp 98.6°F | Resp 16 | Ht 63.0 in | Wt 164.0 lb

## 2021-03-29 DIAGNOSIS — Z86010 Personal history of colonic polyps: Secondary | ICD-10-CM

## 2021-03-29 DIAGNOSIS — Z1211 Encounter for screening for malignant neoplasm of colon: Secondary | ICD-10-CM | POA: Diagnosis not present

## 2021-03-29 MED ORDER — SODIUM CHLORIDE 0.9 % IV SOLN: 500.0000 mL | Freq: Once | INTRAVENOUS | Status: DC

## 2021-03-29 NOTE — Progress Notes (Signed)
[place] vital signs. 

## 2021-03-29 NOTE — Progress Notes (Signed)
To PACU, VsS. Report to [email or web address]

## 2021-03-29 NOTE — Patient Instructions (Signed)
Please read handouts provided. ?Continue present medications. ?Repeat colonoscopy in 5 years. ? ? ?YOU HAD AN ENDOSCOPIC PROCEDURE TODAY AT THE [facility]:   Refer to the procedure report that was given to you for any specific questions about what was found during the examination.  If the procedure report does not answer your questions, please call your gastroenterologist to clarify.  If you requested that your care partner not be given the details of your procedure findings, then the procedure report has been included in a sealed envelope for you to review at your convenience later. ? ?YOU SHOULD EXPECT: Some feelings of bloating in the abdomen. Passage of more gas than usual.  Walking can help get rid of the air that was put into your GI tract during the procedure and reduce the bloating. If you had a lower endoscopy (such as a colonoscopy or flexible sigmoidoscopy) you may notice spotting of blood in your stool or on the toilet paper. If you underwent a bowel prep for your procedure, you may not have a normal bowel movement for a few days. ? ?Please Note:  You might notice some irritation and congestion in your nose or some drainage.  This is from the oxygen used during your procedure.  There is no need for concern and it should clear up in a day or so. ? ?SYMPTOMS TO REPORT IMMEDIATELY: ? ?Following lower endoscopy (colonoscopy or flexible sigmoidoscopy): ? Excessive amounts of blood in the stool ? Significant tenderness or worsening of abdominal pains ? Swelling of the abdomen that is new, acute ? Fever of 100?F or higher ? ? ?For urgent or emergent issues, a gastroenterologist can be reached at any hour by calling [phone]. ?Do not use MyChart messaging for urgent concerns.  ? ? ?DIET:  We do recommend a small meal at first, but then you may proceed to your regular diet.  Drink plenty of fluids but you should avoid alcoholic beverages for 24 hours. ? ?ACTIVITY:  You should plan to take it  easy for the rest of today and you should NOT DRIVE or use heavy machinery until tomorrow (because of the sedation medicines used during the test).   ? ?FOLLOW UP: ?Our staff will call the number listed on your records 48-72 hours following your procedure to check on you and address any questions or concerns that you may have regarding the information given to you following your procedure. If we do not reach you, we will leave a message.  We will attempt to reach you two times.  During this call, we will ask if you have developed any symptoms of COVID 19. If you develop any symptoms (ie: fever, flu-like symptoms, shortness of breath, cough etc.) before then, please call [phone].  If you test positive for Covid 19 in the 2 weeks post procedure, please call and report this information to us.   ? ?If any biopsies were taken you will be contacted by phone or by letter within the next 1-3 weeks.  Please call us at [phone] if you have not heard about the biopsies in 3 weeks.  ? ? ?SIGNATURES/CONFIDENTIALITY: ?You and/or your care partner have signed paperwork which will be entered into your electronic medical record.  These signatures attest to the fact that that the information above on your After Visit Summary has been reviewed and is understood.  Full responsibility of the confidentiality of this discharge information lies with you and/or your care-partner.  ?

## 2021-03-29 NOTE — Progress Notes (Signed)
Pt's states no medical or surgical changes since previsit or office visit. 

## 2021-03-29 NOTE — Op Note (Signed)
[facility] Patient Name: Robin Arellano Procedure Date: 03/29/2021 1:25 PM MRN: [id number] Endoscopist: Milus Banister , MD Age: 70 Referring MD:  Date of Birth: 1951/02/14 Gender: Female Account #: [id number] Procedure:                Colonoscopy Indications:              High risk colon cancer surveillance: Personal                            history of colonic polyps; Colonoscopy 2018 56mm                            ascending TA removed piecemeal Medicines:                Monitored Anesthesia Care Procedure:                Pre-Anesthesia Assessment:                           - Prior to the procedure, a History and Physical                            was performed, and patient medications and                            allergies were reviewed. The patient's tolerance of                            previous anesthesia was also reviewed. The risks                            and benefits of the procedure and the sedation                            options and risks were discussed with the patient.                            All questions were answered, and informed consent                            was obtained. Prior Anticoagulants: The patient has                            taken no previous anticoagulant or antiplatelet                            agents. ASA Grade Assessment: II - A patient with                            mild systemic disease. After reviewing the risks                            and benefits, the patient was deemed in  satisfactory condition to undergo the procedure.                           After obtaining informed consent, the colonoscope                            was passed under direct vision. Throughout the                            procedure, the patient's blood pressure, pulse, and                            oxygen saturations were monitored continuously. The                            [id number] #[id number] was introduced  through the anus                            and advanced to the the terminal ileum, with                            identification of the appendiceal orifice and IC                            valve. The colonoscopy was performed without                            difficulty. The patient tolerated the procedure                            well. The quality of the bowel preparation was                            good. The ileocecal valve, appendiceal orifice, and                            rectum were photographed. Scope In: 1:30:56 PM Scope Out: 1:44:57 PM Scope Withdrawal Time: 0 hours 11 minutes 43 seconds  Total Procedure Duration: 0 hours 14 minutes 1 second  Findings:                 Multiple small and large-mouthed diverticula were                            found in the left colon.                           The exam was otherwise without abnormality on                            direct and retroflexion views. Complications:            No immediate complications. Estimated blood loss:  None. Estimated Blood Loss:     Estimated blood loss: none. Impression:               - Diverticulosis in the left colon.                           - The examination was otherwise normal on direct                            and retroflexion views.                           - No polyps or cancers. Recommendation:           - Patient has a contact number available for                            emergencies. The signs and symptoms of potential                            delayed complications were discussed with the                            patient. Return to normal activities tomorrow.                            Written discharge instructions were provided to the                            patient.                           - Resume previous diet.                           - Continue present medications.                           - Repeat colonoscopy in 5 years. Milus Banister, MD 03/29/2021 1:48:06 PM This report has been signed electronically.

## 2021-03-31 ENCOUNTER — Telehealth: Payer: Self-pay

## 2021-03-31 NOTE — Telephone Encounter (Signed)
LVM

## 2021-04-06 ENCOUNTER — Encounter: Payer: Self-pay | Admitting: Cardiovascular Disease

## 2021-04-06 ENCOUNTER — Other Ambulatory Visit: Payer: Self-pay

## 2021-04-06 ENCOUNTER — Ambulatory Visit: Payer: PPO | Admitting: Cardiovascular Disease

## 2021-04-06 DIAGNOSIS — I10 Essential (primary) hypertension: Secondary | ICD-10-CM

## 2021-04-06 DIAGNOSIS — E785 Hyperlipidemia, unspecified: Secondary | ICD-10-CM | POA: Diagnosis not present

## 2021-04-06 DIAGNOSIS — Z9049 Acquired absence of other specified parts of digestive tract: Secondary | ICD-10-CM | POA: Diagnosis not present

## 2021-04-06 DIAGNOSIS — I2510 Atherosclerotic heart disease of native coronary artery without angina pectoris: Secondary | ICD-10-CM | POA: Diagnosis not present

## 2021-04-06 DIAGNOSIS — Z79899 Other long term (current) drug therapy: Secondary | ICD-10-CM

## 2021-04-06 MED ORDER — SPIRONOLACTONE 25 MG PO TABS: 25.0000 mg | ORAL_TABLET | Freq: Every day | ORAL | 6 refills | Status: DC

## 2021-04-06 MED ORDER — METOPROLOL SUCCINATE ER 25 MG PO TB24: 12.5000 mg | ORAL_TABLET | Freq: Every day | ORAL | 6 refills | Status: DC

## 2021-04-06 NOTE — Progress Notes (Signed)
Cardiology Office Note    Date:  04/13/2021   ID:  Robin Arellano, Robin Arellano [birth year], MRN [id number]  PCP:  Sandi Mariscal, MD  Cardiologist:  Shelva Majestic, MD   F/U evaluation initally referred by Dr.Yun Nancy Fetter at [facility] at [place]  History of Present Illness:  Robin Arellano is a 70 y.o. female who presents for 6 week follow-up cardiology evaluation.  I had seen remotely Robin Arellano in early 1994 with atypical chest pain. She was  evaluated in September 22, 2018 by Dr. Aletta Edouard at [facility] ER with complaints of intermittent chest pain has been occurring off and on over the past year.  Prior to that evaluation, she was evaluated by her PCP and was told that she may have had an old heart attack based on her ECG.  During her ER evaluation she was significantly hypertensive with a blood pressure 187/100.  A chest x-ray did not show any acute abnormalities.  She had experienced some transient left facial numbness for 5 days associated with some dizziness and a CT was done which revealed atrophy with small vessel chronic ischemic changes of deep cerebral white matter without acute intracranial abnormalities.  She has recently been evaluated at [facility] on [address] by Dr. Theressa Millard and she is felt to have prediabetes.  Lipid studies were increased with a total cholesterol of 319, triglycerides 209, LDL cholesterol 224, and she was told to initiate Crestor 10 mg which she has not yet started.  Her sedimentation rate was elevated at 64.  ANA was negative.  RF was normal.  Thyroid studies were normal.  Because of her recent symptomatology, she was referred for cardiology evaluation.  When I saw her in November 2019 she complained of some intermittent chest discomfort which occurs in the center of her chest and seems to radiate to her left shoulder. It is aggravated when she turns a certain way and may last for hours and then ultimately resolves on its own.  She was told that  this may be from "Iinside shingles."   She denied any exertional symptomatology.  Additional history is notable for chronic bilateral low back pain with bilateral sciatica.  She also  has fibromyalgia and osteoarthritis.  Has a history of anxiety and depression.  During my evaluation, I reviewed laboratory from her primary physician which suggested a high likelihood for familial hyperlipidemia with a total cholesterol at 319, LDL cholesterol at 224, triglycerides of 209, and HDL of 53.  With her recurrent chest pain which most likely had musculoskeletal features and her significant hyperlipidemia I recommended she undergo coronary CT a to evaluate potential coronary atherosclerosis and recommended an echo Doppler evaluation particularly with her hypertensive history.  The echo Doppler study was done on October 14, 2018 and showed a normal LV function and normal LV strain with an EF of 60 to 65%.  There was mild aortic valve sclerosis without stenosis, mild MR, and a mildly thickened atrial septum consistent with lipomatous hypertrophy.  She underwent coronary CTA on November 06, 2018 which showed moderate coronary plaque with moderate tubular atherosclerosis in the proximal RCA and mid RCA in the 50 to 69% range.  Left main coronary artery had mild plaque less than 25%.  Her LAD had mild proximal plaque in the 25 to 49% range, also had a moderate long segment of atherosclerotic plaque in the mid LAD just beyond the second diagonal vessel of 50 to 69%.  She was felt to have possible  severe stenosis in the distal LAD with 70 to 99% plaque.  She had a small caliber ramus intermediate vessel.  The circumflex had mild to moderate plaque of 25 to 49% proximally, and then 50 to 69% stenosis.  Subsequent FFR analysis not reveal any significant flow-limiting stenosis with the exception of the distal LAD where the FFR was 0.57.   When I saw her November 20, 2018 she denied any classic exertional chest pain.  Her chest  pain has been recurring and is described as being sharp and knifelike.  Typically it is of short duration.  She also has recently started rosuvastatin 20 mg and has begun to notice some calf discomfort attributed to initiation of statin therapy.    With her abnormal coronary CT angiogram definitive cardiac catheterization was recommended.  She underwent this catheterization by me on November 25, 2018 which revealed mild multivessel nonobstructive CAD with narrowings no greater than 25% in the LAD, circumflex marginal, and dominant RCA.  Medical therapy was recommended with aggressive lipid-lowering therapy with target LDL less than 70.  It was felt that the patient's chest pain had atypical features and most likely was nonischemic.  After her catheterization she ultimately underwent neurologic evaluation and subsequent MRI of her cervical spine which showed mild multilevel degenerative spondylolisthesis in the cervical spine with associated widespread cervical facet arthropathy.  There was superimposed cervical disc and endplate degeneration maximal at C5 and 6 but no associated cervical spinal stenosis.  There was moderate neuroforaminal stenosis at the right C4 right C5 bilateral C6 and left C7 nerve levels.  He has been seeing Dr. Maryjean Ka for pain management and had undergone epidural injection.  She also sees Dr. Vertell Limber.  She denies any exertional chest pain.  She has continued to take rosuvastatin for hyperlipidemia.  She was evaluated by me in a telemedicine encounter on February 16, 2020.  Since her prior evaluation she had undergone successful cervical disc surgery by Dr. Vertell Limber.  Prior to surgery she was having severe pain bilaterally in her arms.  This has essentially resolved.  She is now able to be more active.  Prior to her surgery she could not walk well due to significant pain.  She also tells me that she does have issues with her lower back and in August will be undergoing an MRI and may also be  undergoing an injection.  If symptoms progress she may require future low back surgery.  At her telemedicine visit, she denied any chest pain.  Apparently she had only been taking  metoprolol tartrate 25 mg once a day instead of as prescribed twice a day.  She has noticed some blood pressure elevation.  She denies any swelling.  She denies any anginal symptoms.  During her recent injection her blood pressure did significantly increase.  During that evaluation I suggested changing to metoprolol succinate initially 25 mg but her dose may need to be further titrated to 50 mg.  At that time she was on rosuvastatin 5 mg with target LDL less than 70.  She was hospitalized for 6 days in May 2021 with a diagnosis of acute pancreatitis and common bile duct dilatation.  I last saw her in July 2021 at which time she remained stable from a cardiac standpoint and was scheduled to see  Dr. Barry Dienes at [facility] surgery for possible future cholecystectomy.  During her hospitalization, she was taken off rosuvastatin and also was taken off metoprolol succinate.  She denied chest pain, PND orthopnea.  During that evaluation, her blood pressure was elevated and I recommended resumption of metoprolol tartrate 25 mg twice a day.  Also with discontinuance of rosuvastatin I recommended initiation of Zetia 10 mg daily.  She underwent successful cholecystectomy on July 26, 2020.  She tolerated this well.  She had been off Crestor since May.    When I saw  her on September 08, 2020 her blood pressure was significantly elevated at 190/78 and on repeat by me was 180/78.  She was on amlodipine 10 mg and lisinopril 5 mg and had only been taking metoprolol 12.5 mg twice a day.  At that time I recommended she discontinue lisinopril and started her on olmesartan 20 mg.  I recommended follow-up laboratory.   Laboratory in December showed a total cholesterol 262, triglycerides 197, LDL 160 and HDL 36.  Creatinine was 1.15.  TSH was  normal.  She was subsequently seen by Joslyn Hy, [facility] on November 24, 2020. I saw her on December 19, 2020.  She was felt not eligible for PCSK9 inhibition due to her nonobstructive CAD where she was only found to have mild multivessel disease with up to 25% narrowings.  She was rechallenged with Crestor 5 mg 3 times per week.  She dramatically changed her diet and stopped eating red meat and has been having predominantly chicken and salads.  Her blood pressure has improved but remained elevated and at home is in the 160-170 range.  During that evaluation with her continued blood pressure elevation I recommended further titration of olmesartan to 40 mg daily and increased her metoprolol to 25 mg twice a day.  She was rechallenged with low-dose rosuvastatin and was tolerating this fairly well.  I last saw her on February 17, 2021.  At that time she denied any chest pain or shortness of breath.  She had issues with low back discomfort and increased stress which was associated with blood pressure lability.  Her blood pressure was increased and on repeat by me was 190/84.  At that time I recommended initiation of hydralazine 25 mg every 8 hours as well as spironolactone 12.5 mg daily.  She already was on amlodipine 10 mg, metoprolol tartrate 25 mg in the morning and 12.5 mg at night in addition to olmesartan 40 mg daily.  I scheduled her for renal duplex study to evaluate for potential renal vascular etiology.  She was tolerating rosuvastatin 3 days/week and I recommended slight titration to every other day at the 5 mg dose and she continues to be on Zetia 10 mg daily.  She was seen by Greg Cutter on Mar 14, 2021 for Pharm.D. follow-up evaluation.  She had been started on the spironolactone 12.5 mg daily.  I follow-up be met showed a BUN of 16 and creatinine 1.34, slightly increased from 1.142 months previously.  Renal duplex study did not demonstrate evidence for renal artery stenosis.  She had abnormal right  resistive index and normal cortical thickness of the right kidney.  The left kidney appeared somewhat atrophic.  There was a normal left resistive index and normal cortical thickness.  Presently, her blood pressure has improved but she states at home it may run around 150/78.  She had undergone colonoscopy last week and tolerated that well.  She admits to fatigability.  She has been taking metoprolol titrate 12.5 mg twice a day, amlodipine 10 mg, hydralazine 25 mg every 8 hours, olmesartan 40 mg, spironolactone 12.5 mg, and combination of Zetia and rosuvastatin 5 mg up  to 3 times per week.  She presents for reevaluation.  Past Medical History:  Diagnosis Date  . ADHD   . Anxiety    on meds  . Back pain   . Chronic female pelvic pain   . Coronary artery disease    mild, non-obstructive 11/2018  . Depression    on meds  . Fibromyalgia   . H/O leukocytosis   . Headache   . Hyperlipidemia    on meds  . Hypertension    on meds  . MI (myocardial infarction) ([place])    Pt states she did not have a MI- EKG was normal, was GERD  . Osteoarthritis    on meds  . Ovarian cyst, right   . PONV (postoperative nausea and vomiting)   . Post-operative nausea and vomiting   . SVD (spontaneous vaginal delivery)    x 2  . Vitamin D deficiency     Past Surgical History:  Procedure Laterality Date  . ABDOMINAL HYSTERECTOMY  1994   TAH.BSO  . ANTERIOR CERVICAL DECOMP/DISCECTOMY FUSION  2019  . APPENDECTOMY  1975  . BACK SURGERY    . BIOPSY  05/12/2020   Procedure: BIOPSY;  Surgeon: Milus Banister, MD;  Location: [facility];  Service: Endoscopy;;  . CARDIAC CATHETERIZATION  2020  . CHOLECYSTECTOMY N/A 07/21/2020   Procedure: LAPAROSCOPIC CHOLECYSTECTOMY WITH INTRAOPERATIVE CHOLANGIOGRAM;  Surgeon: [name], MD;  Location: [place];  Service: General;  Laterality: N/A;  . COLONOSCOPY  08/12/2017   Hx TA (piecemeal)Jacobs-MAC-suprep (good)  . ESOPHAGOGASTRODUODENOSCOPY (EGD) WITH PROPOFOL N/A  05/12/2020   Procedure: ESOPHAGOGASTRODUODENOSCOPY (EGD) WITH PROPOFOL;  Surgeon: Milus Banister, MD;  Location: [facility];  Service: Endoscopy;  Laterality: N/A;  . EUS N/A 05/12/2020   Procedure: UPPER ENDOSCOPIC ULTRASOUND (EUS) RADIAL;  Surgeon: Milus Banister, MD;  Location: [facility];  Service: Endoscopy;  Laterality: N/A;  . KNEE SURGERY Bilateral 1996   x 2 - arthroscopic  . LEFT HEART CATH AND CORONARY ANGIOGRAPHY N/A 11/25/2018   Procedure: LEFT HEART CATH AND CORONARY ANGIOGRAPHY;  Surgeon: Troy Sine, MD;  Location: [place] [facility];  Service: Cardiovascular;  Laterality: N/A;  . PELVIC LAPAROSCOPY  1989   W LYSIS OF ADHESIONS/L SALPINGONEOSTOMY  . TUBAL LIGATION    . WISDOM TOOTH EXTRACTION      Current Medications: Outpatient Medications Prior to Visit  Medication Sig Dispense Refill  . ALPRAZolam (XANAX) 0.5 MG tablet Take 0.5 mg by mouth at bedtime as needed for anxiety or sleep.     Marland Kitchen amLODipine (NORVASC) 10 MG tablet TAKE 1 TABLET(10 MG) BY MOUTH DAILY 90 tablet 3  . Ascorbic Acid (VITAMIN C) 1000 MG tablet Take 1,000 mg by mouth daily.    Marland Kitchen aspirin EC 81 MG tablet Take 81 mg by mouth daily. Swallow whole.    . butalbital-acetaminophen-caffeine (FIORICET, ESGIC) 50-325-40 MG tablet Take 1 tablet by mouth every 6 (six) hours as needed for headache.   0  . Cholecalciferol (VITAMIN D) 50 MCG (2000 UT) tablet Take 2,000 Units by mouth daily.     Marland Kitchen ezetimibe (ZETIA) 10 MG tablet TAKE 1 TABLET(10 MG) BY MOUTH DAILY 90 tablet 3  . hydrALAZINE (APRESOLINE) 25 MG tablet Take 1 tablet (25 mg total) by mouth 3 (three) times daily. 270 tablet 3  . HYDROcodone-acetaminophen (NORCO) 10-325 MG tablet Take 1 tablet by mouth 2 (two) times daily as needed for moderate pain.     . Multiple Vitamin (MULTIVITAMIN WITH MINERALS)  TABS tablet Take 1 tablet by mouth daily.    Marland Kitchen olmesartan (BENICAR) 40 MG tablet Take 1 tablet (40 mg total) by mouth daily. 90 tablet 3  . rosuvastatin  (CRESTOR) 5 MG tablet Take 1 tablet by mouth up to 3 times per week as tolerated 30 tablet 3  . valACYclovir (VALTREX) 500 MG tablet TAKE 1 TABLET BY MOUTH TWICE DAILY FOR 3 TO 5 DAYS THEN TAKE DAILY AS NEEDED (Patient taking differently: Take 500 mg by mouth daily as needed (shingles).) 30 tablet 0  . zolpidem (AMBIEN) 10 MG tablet Take 10 mg by mouth at bedtime as needed for sleep.     . metoprolol tartrate (LOPRESSOR) 25 MG tablet Take 0.5 tablets (12.5 mg total) by mouth in the morning AND 0.5 tablets (12.5 mg total) every evening. 90 tablet 3  . spironolactone (ALDACTONE) 25 MG tablet Take 0.5 tablets (12.5 mg total) by mouth daily. 45 tablet 3  . omeprazole (PRILOSEC) 40 MG capsule TAKE 1 CAPSULE(40 MG) BY MOUTH DAILY 30 capsule 1   No facility-administered medications prior to visit.     Allergies:   Cyclobenzaprine, Tizanidine, and Latex   Social History   Socioeconomic History  . Marital status: Married    Spouse name: don  . Number of children: 2  . Years of education: College  . Highest education level: Not on file  Occupational History  . Occupation: Realtor  Tobacco Use  . Smoking status: Former Smoker    Packs/day: 0.15    Years: 20.00    Pack years: 3.00    Types: Cigarettes    Quit date: 11/12/1998    Years since quitting: 22.4  . Smokeless tobacco: Never Used  Vaping Use  . Vaping Use: Never used  Substance and Sexual Activity  . Alcohol use: Not Currently  . Drug use: No  . Sexual activity: Not Currently    Birth control/protection: Post-menopausal, Surgical    Comment: HYSTERECTOMY  Other Topics Concern  . Not on file  Social History Narrative   Lives at home with her husband Timmothy Sours   Right handed   Caffeine: unsweet tea, 2 glasses daily   Social Determinants of Health   Financial Resource Strain: Not on file  Food Insecurity: Not on file  Transportation Needs: Not on file  Physical Activity: Not on file  Stress: Not on file  Social Connections: Not  on file    Social history is notable in that she has had several marriages and currently has been married for 16 years.  She has 2 children, 2 grandchildren.  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  There is a higher tobacco history, she quit in 2002.  She does yoga intermittently.  Family History:  The patient's family history includes Aneurysm in her father; COPD in her brother; Heart disease in her paternal grandfather; Other in her sister and sister; Skin cancer in her brother and brother; Uterine cancer (age of onset: 79) in her mother.   Her mother died at age 45.  Her father died at age 29 and had blood clots.  A brother died at age 14 and was exposed to agent orange.  She has a brother age 59 with cancer.  She has 5 sisters.   ROS General: Negative; No fevers, chills, or night sweats;  HEENT: Negative; No changes in vision or hearing, sinus congestion, difficulty swallowing Pulmonary: Negative; No cough, wheezing, shortness of breath, hemoptysis Cardiovascular: See HPI GI: Recent pancreatitis and  cholecystectomy GU: History of UTIs Musculoskeletal: Chronic low back pain, osteo-arthritis, fibromyalgia Hematologic/Oncology: Negative; no easy bruising, bleeding Endocrine: Negative; no heat/cold intolerance; no diabetes Neuro: Negative; no changes in balance, headaches Skin: Negative; No rashes or skin lesions Psychiatric: Admits to increased stress Sleep: Negative; No snoring, daytime sleepiness, hypersomnolence, bruxism, restless legs, hypnogognic hallucinations, no cataplexy Other comprehensive 14 point system review is negative.   PHYSICAL EXAM:   VS:  BP (!) 150/78 (BP Location: Left Arm, Patient Position: Sitting, Cuff Size: Normal)   Pulse (!) 54   Resp 20   Ht _0  (1.6 m)   Wt 163 lb 12.8 oz (74.3 kg)   LMP  (LMP Unknown)   SpO2 99%   BMI 29.02 kg/m     Repeat blood pressure by me was 146/84  Wt Readings from Last 3 Encounters:  04/06/21 163 lb 12.8 oz  (74.3 kg)  03/29/21 164 lb (74.4 kg)  03/13/21 164 lb (74.4 kg)    General: Alert, oriented, no distress.  Skin: normal turgor, no rashes, warm and dry HEENT: Normocephalic, atraumatic. Pupils equal round and reactive to light; sclera anicteric; extraocular muscles intact;  Nose without nasal septal hypertrophy Mouth/Parynx benign; Mallinpatti scale 3  Neck: No JVD, no carotid bruits; normal carotid upstroke Lungs: clear to ausculatation and percussion; no wheezing or rales Chest wall: without tenderness to palpitation Heart: PMI not displaced, RRR, s1 s2 normal, 1/6 systolic murmur, no diastolic murmur, no rubs, gallops, thrills, or heaves Abdomen: soft, nontender; no hepatosplenomehaly, BS+; abdominal aorta nontender and not dilated by palpation. Back: no CVA tenderness Pulses 2+ Musculoskeletal: full range of motion, normal strength, no joint deformities Extremities: no clubbing cyanosis or edema, Homan's sign negative  Neurologic: grossly nonfocal; Cranial nerves grossly wnl Psychologic: Normal mood and affect   Studies/Labs Reviewed:   ECG (independently read by me): Sinus bradycardia 54 bpm, possible left atrial enlargement.  Nonspecific ST changes.  Normal intervals.  No ectopy.  February 17, 2021 ECG (independently read by me): Sinus bradycardia at 56; Mild ST changes  February 2022 ECG (independently read by me): Sinus bradycardia 53 bpm.  No ectopy.  Normal intervals     July 20, 2021ECG (independently read by me): Normal sinus rhythm at 66 bpm.  Left atrial enlargement.  Nonspecific ST changes.  QTc interval 461 Robin.  March 2020 ECG (independently read by me): Sinus bradycardia 59 bpm.  Normal intervals.  No ectopy.  November 20, 2018 EKG:  EKG is ordered today. Sinus Bradycardia at 58;Q wave III, no STT changes  October 06, 2018 ECG (independently read by me): Normal sinus rhythm at 66 bpm.  Nondiagnostic Q waves in lead III and aVF.  Normal intervals.  No ectopy.  No ST  segment changes.  Recent Labs: BMP Latest Ref Rng & Units 03/14/2021 01/20/2021 10/13/2020  Glucose 65 - 99 mg/dL 84 82 89  BUN 8 - 27 mg/dL _1 Creatinine 0.57 - 1.00 mg/dL 1.34(H) 1.14(H) 1.15(H)  BUN/Creat Ratio 12 - _2 Sodium 134 - 144 mmol/L 136 136 136  Potassium 3.5 - 5.2 mmol/L 4.0 4.5 4.5  Chloride 96 - 106 mmol/L 100 99 98  CO2 20 - 29 mmol/L _3 Calcium 8.7 - 10.3 mg/dL 9.3 9.1 9.8     Hepatic Function Latest Ref Rng & Units 01/20/2021 10/13/2020 07/13/2020  Total Protein 6.0 - 8.5 g/dL 6.9 7.5 6.5  Albumin 3.8 - 4.8 g/dL 4.1 4.4 3.4(L)  AST 0 - 40 IU/L _0 ALT 0 - 32 IU/L _1 Alk Phosphatase 44 - 121 IU/L 94 97 64  Total Bilirubin 0.0 - 1.2 mg/dL 0.2 0.3 0.8  Bilirubin, Direct 0.0 - 0.2 mg/dL - - -    CBC Latest Ref Rng & Units 10/13/2020 07/13/2020 04/20/2020  WBC 3.4 - 10.8 x10E3/uL 12.7(H) 11.1(H) 11.8(H)  Hemoglobin 11.1 - 15.9 g/dL 12.6 12.0 11.6(L)  Hematocrit 34.0 - 46.6 % 36.1 37.5 35.0(L)  Platelets 150 - 450 x10E3/uL 388 333 465.0(H)   Lab Results  Component Value Date   MCV 97 10/13/2020   MCV 103.9 (H) 07/13/2020   MCV 96.7 04/20/2020   Lab Results  Component Value Date   TSH 0.860 10/13/2020   No results found for: HGBA1C   BNP No results found for: BNP  ProBNP No results found for: PROBNP   Lipid Panel     Component Value Date/Time   CHOL 208 (H) 01/20/2021 1230   TRIG 122 01/20/2021 1230   HDL 68 01/20/2021 1230   CHOLHDL 3.1 01/20/2021 1230   LDLCALC 119 (H) 01/20/2021 1230     RADIOLOGY: No results found.   Additional studies/ records that were reviewed today include:  I reviewed the records from the emergency room.  I reviewed the records from [place] [facility] at [facility] [employer].  Records from [facility] were reviewed  ------------------------------------------------------------------- 10/14/2018 ECHO Study Conclusions  - Left ventricle: Average global longitudinal  LV strain is normal   at -18.7% The cavity size was normal. Systolic function was   normal. The estimated ejection fraction was in the range of 60%   to 65%. Wall motion was normal; there were no regional wall   motion abnormalities. The study is not technically sufficient to   allow evaluation of LV diastolic function. - Aortic valve: Trileaflet; mildly thickened, mildly calcified   leaflets. - Mitral valve: There was mild regurgitation. - Atrial septum: There was increased thickness of the septum,   consistent with lipomatous hypertrophy.   RENAL DUPLEX: 02/22/2021 Summary:  Renal:    Right: No evidence of right renal artery stenosis. RRV flow present.     Normal size right kidney. Abnormal right Resistive Index.     Normal cortical thickness of right kidney.  Left: No evidence of left renal artery stenosis. LRV flow present.     Abnormal size for the left kidney; appears atrophic. Normal     left Resistive Index. Normal cortical thickness of the left     kidney.  Mesenteric:  Normal Celiac artery and Superior Mesenteric artery findings.     ASSESSMENT:    1. Primary hypertension   2. Coronary artery disease involving native coronary artery of native heart without angina pectoris   3. Hyperlipidemia with target LDL less than 70   4. History of cholecystectomy   5. Medication management     PLAN:  Robin. Kealie Wilmot is a 70 year old female who had experienced intermittent episodes of somewhat atypical chest discomfort.  Her chest pain has been occurring off and on for over a year and  typically is nonexertional.  Her ECG shows normal sinus rhythm.  She has small nondiagnostic inferior Q waves.  I do not believe she has had a prior myocardial infarction.  She has had issues with stage II hypertension and her blood pressure was significantly elevated at her emergency room evaluation.  When I initially saw her I recommended the  addition of amlodipine to her  medical regimen. An echo Doppler study demonstrated normal systolic without wall motion abnormalities and with normal global longitudinal strain.  There was evidence for mild aortic sclerosis without stenosis, mild MR, and probable lipomatous hypertrophy of her atrial septum.  She  continued to experience sharp stabbing-like chest pain and previously on physical examination today she had definite tenderness to her costochondral region pectoral region suggesting her chest pain is most likely of musculoskeletal etiology.  Her CT angiogram raised the possibility of at least moderate multivessel coronary atherosclerosis and her distal LAD lesion was felt to be FFR significant.  Definitive cardiac catheterization did not  demonstrate high-grade obstructive disease with no narrowings greater than 25% in the LAD circumflex marginal and dominant RCA.  She has subsequently been found to have multilevel cervical degenerative spondylolisthesis as well as cervical disc disease as noted above.  She underwent successful cervical disc surgery by Dr. Vertell Limber with significant benefit in improvement in her severe bilateral arm discomfort.  She subsequently had an episode of pancreatitis with dilated bile duct and underwent successful cholecystectomy on July 26, 2020.  Over the last several months she has had difficulty with blood pressure lability.  When I last saw her on February 17, 2021 blood pressure was significantly increased and at that time I added hydralazine 25 mg every 8 hours and initiated spironolactone 12.5 mg daily.  She also was on amlodipine 10 mg, metoprolol tartrate 25 mg in the morning and 12.5 mg in the evening as well as olmesartan.  I scheduled her for renal duplex study which did not reveal any evidence for bilateral renal artery stenosis.  Creatinine most recently was 1.34 increased from 1.14 with the addition of spironolactone.  Her blood pressure today is improved but still elevated.  I have suggested slight  titration of spironolactone to 25 mg daily.  She admits to fatigability.  Her resting pulse is 54.  I am changing her metoprolol to tartrate to metoprolol succinate 12.5 mg and have recommended she take this at bedtime.  At 1 to 2 weeks I am recommending a follow-up comprehensive metabolic panel to assess renal function as well as LFTs.  She continues to tolerate rosuvastatin 5 mg 3 times a week in addition to Zetia 10 mg for hyperlipidemia with target LDL less than 70.  She is not having any anginal symptomatology.  She continues to be on omeprazole for reflux issues.  I will see her in 3 months for reevaluation or sooner as needed.   Medication Adjustments/Labs and Tests Ordered: Current medicines are reviewed at length with the patient today.  Concerns regarding medicines are outlined above.  Medication changes, Labs and Tests ordered today are listed in the Patient Instructions below. Patient Instructions  Medtion Instructions:  STOP METOPROLOL TARTRATE   START METOPROLOL SUCC 12.5MG  INCREASE SPIRONOLACTONE 25MG IN THE AM *If you need a refill on your cardiac medications before your next appointment, please call your pharmacy*  Lab Work: CMET IN 2 WEEKS-THIS IS NOT FASTING If you have labs (blood work) drawn today and your tests are completely normal, you will receive your results only by:  [place] (if you have MyChart) OR A paper copy in the mail.  If you have any lab test that is abnormal or we need to change your treatment, we will call you to review the results. You may go to any [facility] that is convenient for you however, we do have a lab in our  office that is able to assist you. You DO NOT need an appointment for our lab. The lab is open 8:00am and closes at 4:00pm. Lunch 12:45 - 1:45pm.  Follow-Up: Your next appointment:  3 month(s) In Person with Shelva Majestic, MD   At [facility], you and your health needs are our priority.  As part of our continuing mission to provide  you with exceptional heart care, we have created designated Provider Care Teams.  These Care Teams include your primary Cardiologist (physician) and Advanced Practice Providers (APPs -  Physician Assistants and Nurse Practitioners) who all work together to provide you with the care you need, when you need it.     Signed, Shelva Majestic, MD  04/13/2021 2:01 PM    [facility] [address], [place], [place], [place]  [address] Phone: [phone]

## 2021-04-06 NOTE — Patient Instructions (Signed)
Medtion Instructions:  STOP METOPROLOL TARTRATE   START METOPROLOL SUCC 12.5MG   INCREASE SPIRONOLACTONE 25MG  IN THE AM *If you need a refill on your cardiac medications before your next appointment, please call your pharmacy*  Lab Work: CMET IN 2 WEEKS-THIS IS NOT FASTING If you have labs (blood work) drawn today and your tests are completely normal, you will receive your results only by:  [place] (if you have MyChart) OR A paper copy in the mail.  If you have any lab test that is abnormal or we need to change your treatment, we will call you to review the results. You may go to any [facility] that is convenient for you however, we do have a lab in our office that is able to assist you. You DO NOT need an appointment for our lab. The lab is open 8:00am and closes at 4:00pm. Lunch 12:45 - 1:45pm.  Follow-Up: Your next appointment:  3 month(s) In Person with Shelva Majestic, MD   At [facility], you and your health needs are our priority.  As part of our continuing mission to provide you with exceptional heart care, we have created designated Provider Care Teams.  These Care Teams include your primary Cardiologist (physician) and Advanced Practice Providers (APPs -  Physician Assistants and Nurse Practitioners) who all work together to provide you with the care you need, when you need it.

## 2021-04-13 ENCOUNTER — Encounter: Payer: Self-pay | Admitting: Cardiovascular Disease

## 2021-04-20 DIAGNOSIS — M5416 Radiculopathy, lumbar region: Secondary | ICD-10-CM | POA: Diagnosis not present

## 2021-05-22 DIAGNOSIS — M5416 Radiculopathy, lumbar region: Secondary | ICD-10-CM | POA: Diagnosis not present

## 2021-05-22 DIAGNOSIS — M5136 Other intervertebral disc degeneration, lumbar region: Secondary | ICD-10-CM | POA: Diagnosis not present

## 2021-05-22 DIAGNOSIS — R519 Headache, unspecified: Secondary | ICD-10-CM | POA: Diagnosis not present

## 2021-05-29 ENCOUNTER — Other Ambulatory Visit: Payer: Self-pay | Admitting: Neurosurgery

## 2021-05-29 DIAGNOSIS — M5416 Radiculopathy, lumbar region: Secondary | ICD-10-CM

## 2021-06-06 ENCOUNTER — Other Ambulatory Visit: Payer: Self-pay

## 2021-06-06 ENCOUNTER — Other Ambulatory Visit: Payer: PPO

## 2021-06-06 ENCOUNTER — Ambulatory Visit
Admission: RE | Admit: 2021-06-06 | Discharge: 2021-06-06 | Disposition: A | Discharge status: Home / Self Care | Payer: PPO | Source: Ambulatory Visit | Attending: Neurosurgery | Admitting: Neurosurgery

## 2021-06-06 DIAGNOSIS — M545 Low back pain, unspecified: Secondary | ICD-10-CM | POA: Diagnosis not present

## 2021-06-06 DIAGNOSIS — M5416 Radiculopathy, lumbar region: Secondary | ICD-10-CM

## 2021-06-06 IMAGING — MR MR LUMBAR SPINE W/O CM
4 of 5 series · 28 of 48 positions shown · non-contrast
Comparison: Lumbar MRI [DATE]

CLINICAL DATA: Low back pain.  Bilateral leg pain.

EXAM:
MRI LUMBAR SPINE WITHOUT CONTRAST
TECHNIQUE: Multiplanar, multisequence MR imaging of the lumbar spine was
performed. No intravenous contrast was administered.

[Series 5: T2 · sagittal · 4.0mm · 0.75mm/px · 6 of 15 slices shown (1 of 2)]
[im 1/15]
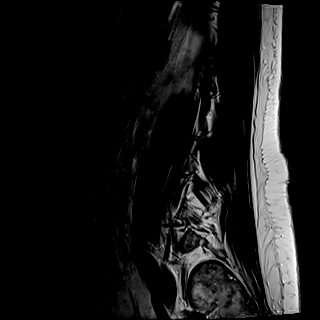
[im 3/15]
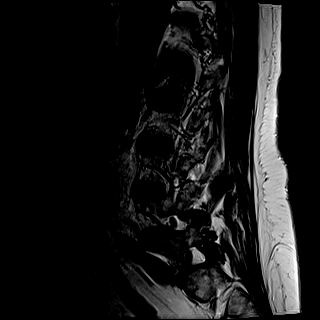
[im 6/15]
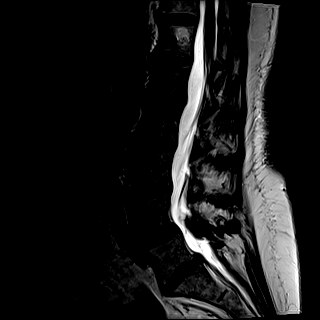
[im 9/15]
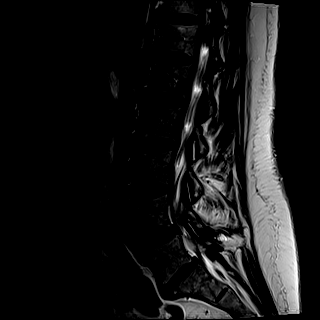
[im 12/15]
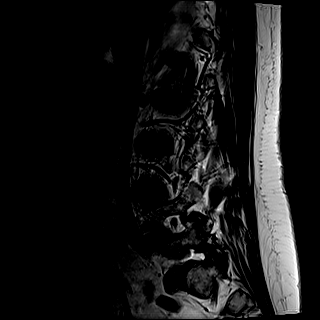
[im 15/15]
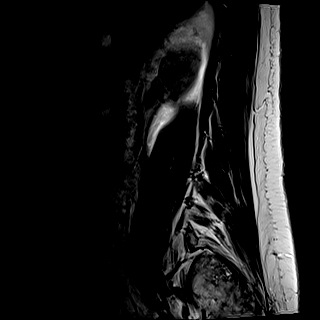

[Series 6: T1 · sagittal · 4.0mm · 0.75mm/px · 6 of 15 slices shown (1 of 2)]
[im 1/15]
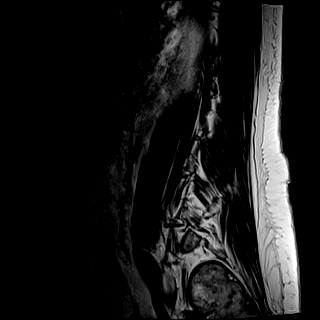
[im 3/15]
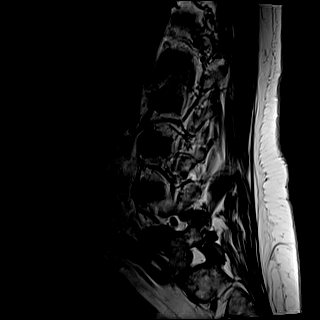
[im 6/15]
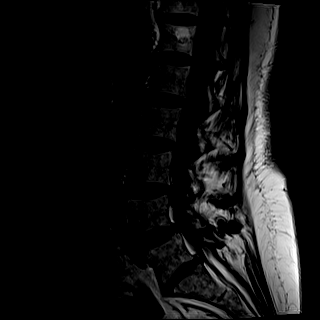
[im 9/15]
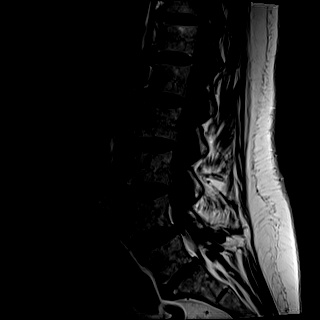
[im 12/15]
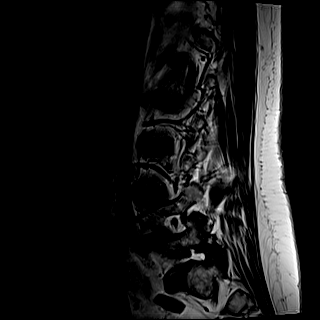
[im 15/15]
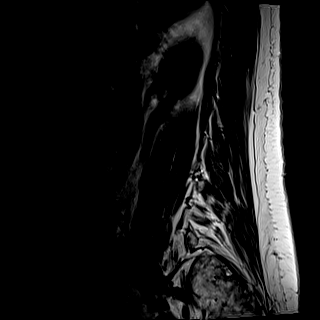

[Series 10: T1 · axial · 4.0mm · 0.28mm/px · z∈[-48,+141]mm · 7 of 40 slices shown (2 of 2)]
[im 1/40]
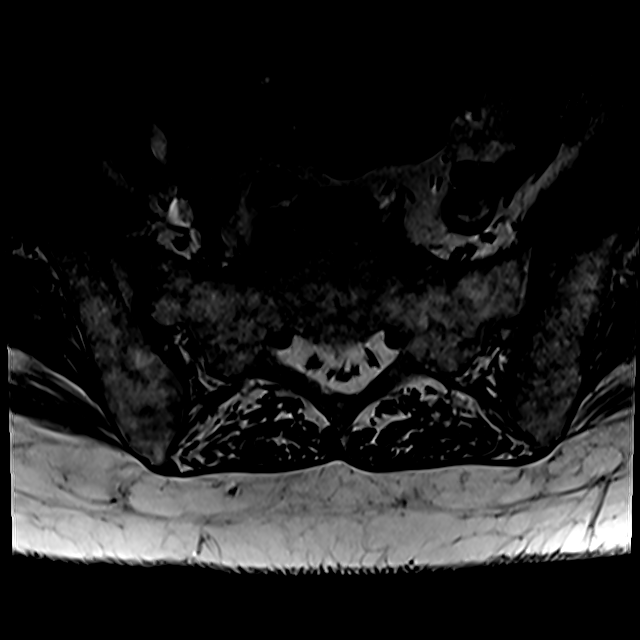
[im 6/40]
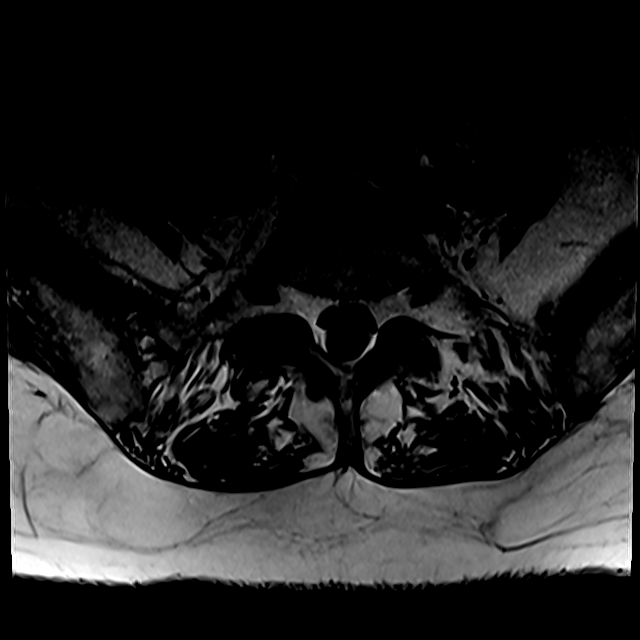
[im 12/40]
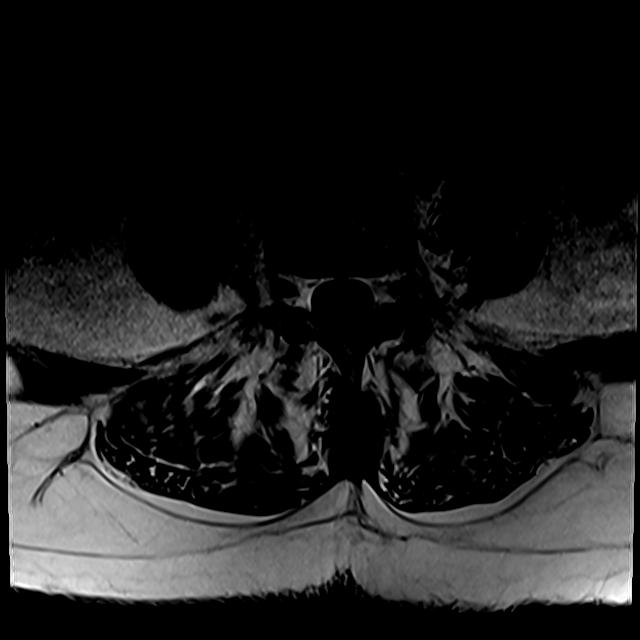
[im 17/40]
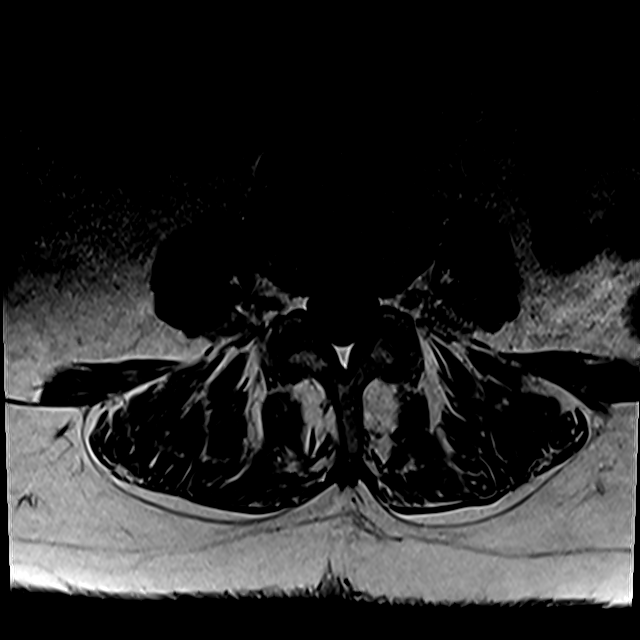
[im 20/40]
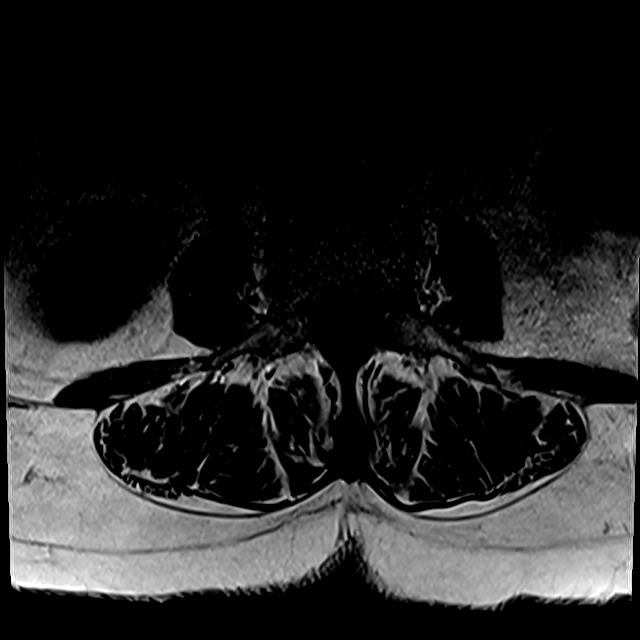
[im 23/40]
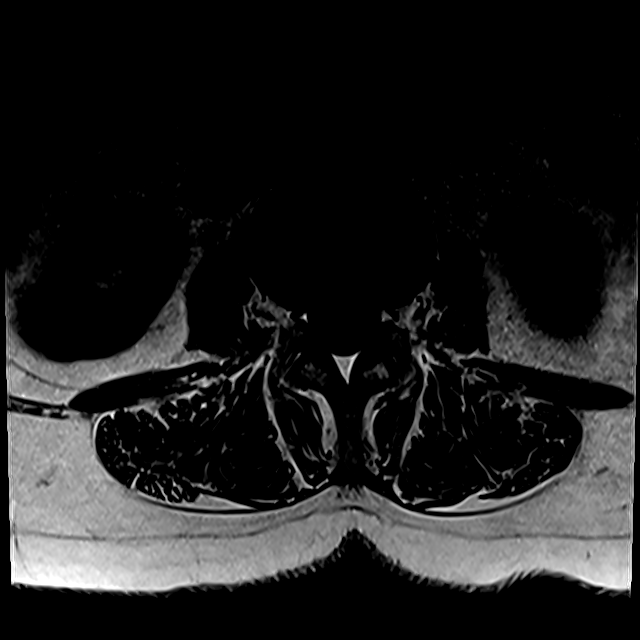
[im 34/40]
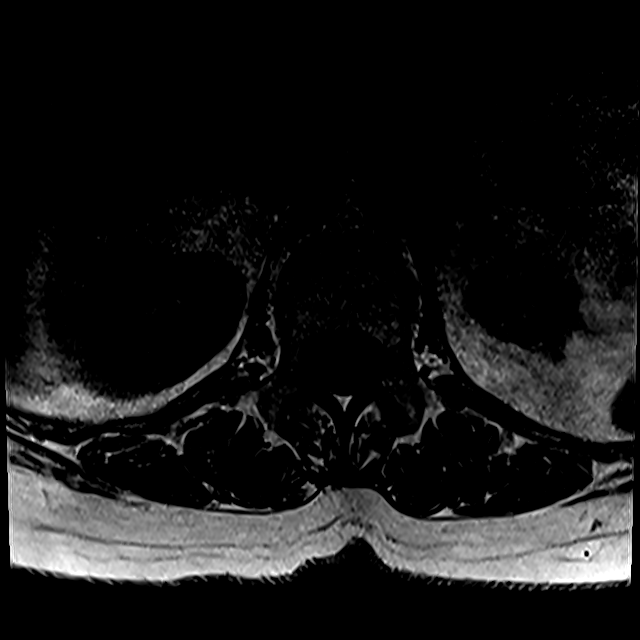

[Series 13: T2 · axial · 4.0mm · 0.56mm/px · z∈[-48,+171]mm · 9 of 40 slices shown (2 of 2)]
[im 1/40]
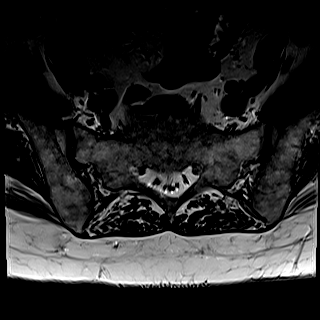
[im 6/40]
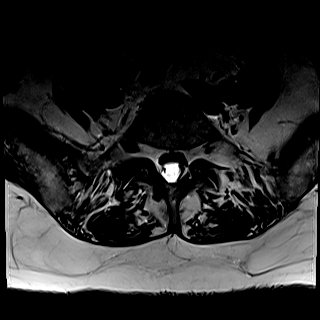
[im 12/40]
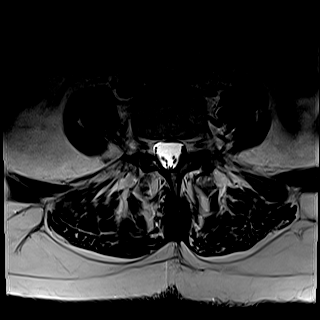
[im 17/40]
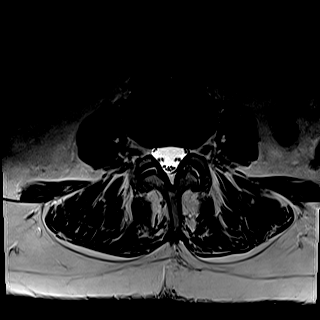
[im 20/40]
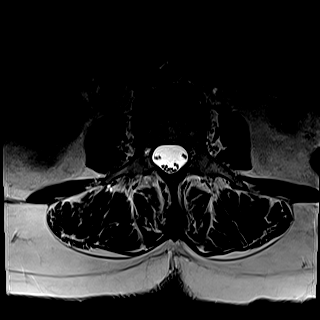
[im 23/40]
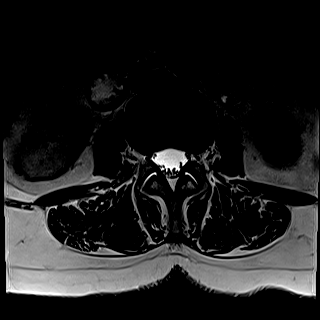
[im 28/40]
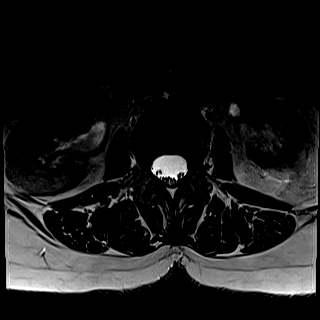
[im 34/40]
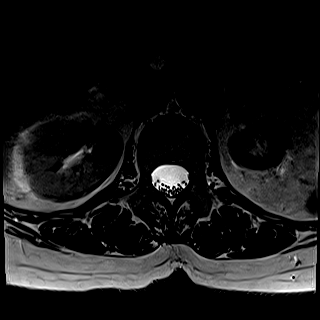
[im 40/40]
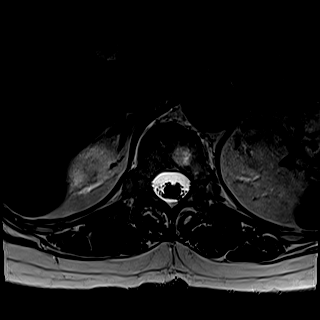

[28 of 48 positions shown; findings below may reference images not displayed]

FINDINGS: Segmentation:  Standard

Alignment: Mild retrolisthesis L2-3 and L3-4. Mild anterolisthesis
L4-5.

Vertebrae:  Normal bone marrow.  Negative for fracture or mass

Conus medullaris and cauda equina: Conus extends to the L1 level.
Conus and cauda equina appear normal.

Paraspinal and other soft tissues: Negative for paraspinous mass or
adenopathy

Disc levels:

L1-2: Negative

L2-3: Mild facet degeneration.  Negative for stenosis

L3-4: Mild disc and facet degeneration.  Negative for stenosis

L4-5: Mild anterolisthesis with advanced facet degeneration.
Negative for spinal or foraminal stenosis. No change from the prior
MRI.

L5-S1: Mild disc space narrowing. Shallow central disc protrusion
and bilateral facet degeneration appears unchanged. No significant
neural impingement.
IMPRESSION: Disc and facet degeneration L4-5 and L5-S1. No significant stenosis
or neural impingement. No significant change since [Y5] MRI.

## 2021-06-08 ENCOUNTER — Telehealth: Payer: Self-pay | Admitting: Gastroenterology

## 2021-06-08 NOTE — Telephone Encounter (Signed)
Inbound call from patient. States she is have abd pain in stomach that leads to rib cage and travels to her back. States she have had an MRI Monday 06/05/2021. L1/L2 degenerating disk.

## 2021-06-08 NOTE — Telephone Encounter (Signed)
See pt MyChart message.

## 2021-06-12 NOTE — Telephone Encounter (Signed)
I spoke with the pt and advised her that Dr Ardis Hughs is out of the office until next week.  She states she continues to have abd pain. She was advised that if her pain is severe or she can not wait for her appt she should go to the ED or urgent care.  The pt has been advised of the information and verbalized understanding.   The pt has also been added to the waitlist.

## 2021-06-12 NOTE — Telephone Encounter (Signed)
Patient called requesting to speak with Dr. Ardis Hughs for a sooner appt said she is in a lot of pain.

## 2021-06-15 DIAGNOSIS — I10 Essential (primary) hypertension: Secondary | ICD-10-CM | POA: Diagnosis not present

## 2021-06-15 DIAGNOSIS — R519 Headache, unspecified: Secondary | ICD-10-CM | POA: Diagnosis not present

## 2021-06-15 DIAGNOSIS — Z79899 Other long term (current) drug therapy: Secondary | ICD-10-CM | POA: Diagnosis not present

## 2021-06-15 DIAGNOSIS — I2510 Atherosclerotic heart disease of native coronary artery without angina pectoris: Secondary | ICD-10-CM | POA: Diagnosis not present

## 2021-06-15 DIAGNOSIS — M5136 Other intervertebral disc degeneration, lumbar region: Secondary | ICD-10-CM | POA: Diagnosis not present

## 2021-06-15 DIAGNOSIS — M5416 Radiculopathy, lumbar region: Secondary | ICD-10-CM | POA: Diagnosis not present

## 2021-06-15 DIAGNOSIS — M542 Cervicalgia: Secondary | ICD-10-CM | POA: Diagnosis not present

## 2021-06-16 LAB — COMPREHENSIVE METABOLIC PANEL WITH GFR
ALT: 7 IU/L (ref 0–32)
AST: 13 IU/L (ref 0–40)
Albumin/Globulin Ratio: 1.7 (ref 1.2–2.2)
Albumin: 4.4 g/dL (ref 3.8–4.8)
Alkaline Phosphatase: 103 IU/L (ref 44–121)
BUN/Creatinine Ratio: 10 — ABNORMAL LOW (ref 12–28)
BUN: 16 mg/dL (ref 8–27)
Bilirubin Total: <0.2 mg/dL (ref 0.0–1.2)
CO2: 21 mmol/L (ref 20–29)
Calcium: 9.8 mg/dL (ref 8.7–10.3)
Chloride: 101 mmol/L (ref 96–106)
Creatinine, Ser: 1.66 mg/dL — ABNORMAL HIGH (ref 0.57–1.00)
Globulin, Total: 2.6 g/dL (ref 1.5–4.5)
Glucose: 88 mg/dL (ref 65–99)
Potassium: 5 mmol/L (ref 3.5–5.2)
Sodium: 137 mmol/L (ref 134–144)
Total Protein: 7 g/dL (ref 6.0–8.5)
eGFR: 33 mL/min/1.73 — ABNORMAL LOW

## 2021-07-06 ENCOUNTER — Other Ambulatory Visit: Payer: Self-pay

## 2021-07-06 DIAGNOSIS — Z79899 Other long term (current) drug therapy: Secondary | ICD-10-CM

## 2021-07-24 DIAGNOSIS — M5416 Radiculopathy, lumbar region: Secondary | ICD-10-CM | POA: Diagnosis not present

## 2021-08-07 ENCOUNTER — Telehealth: Payer: Self-pay | Admitting: Cardiovascular Disease

## 2021-08-07 NOTE — Telephone Encounter (Signed)
Spoke with the pt and she reports that her BP has been elevated but she feels her BP meds are making her feel bad... she is very frustrated she wants to see Dr. Claiborne Billings but unable to make her an appt prior to this Spring.. but she agreed to an appt with A Duke PA 08/14/21... I have also asked her to call her PCP.   She has been having dizziness, headache, no chest pain but thanks her meds are too strong but her BP readings 2 hours after taking her meds...    159/70 158/75 163/76 131/88 162/83  HR 60-80  I advised the pt per her request that I will forward to Dr. Claiborne Billings for his review prior to her appt to see if he has any recommendations re: her meds.   She will continue to monitor and call if she has problems prior to her appt.

## 2021-08-07 NOTE — Telephone Encounter (Signed)
   Pt said she is having reactions to the 7 meds Dr. Claiborne Billings prescribed to her. She would like to get an appt with Dr. Claiborne Billings, offered PA and wait list but she said she is having a reactions and needs to be seen

## 2021-08-13 NOTE — Progress Notes (Signed)
Cardiology Office Note:    Date:  08/14/2021   ID:  Robin Jaala, Arellano [birth year], MRN [id number]  PCP:  Sandi Mariscal, MD  Cardiologist:  Shelva Majestic, MD   Referring MD: Sandi Mariscal, MD   Chief Complaint  Patient presents with   Follow-up    HTN    History of Present Illness:    Robin Arellano is a 70 y.o. female with a hx of hypertension/hypertensive urgency, CAD, suspected familial hyperlipidemia. She also has significant back pain, followed by Dr. Vertell Limber.   Echo 10/2018 showed normal LVEF strain, EF 60-65%, mild aortic valve sclerosis without stenosis, mild MR, mildly thickened atrial septum consistent with lipomatous hypertrophy. Coronary CTA Dec 2019 showed moderate coronary plaque with FFR not significant except in the distal LAD where the FFR was 0.57.  She continued to have chest pain and underwent left heart cath in Jan 2020. LHC showed nonobstructive CAD. Medical therapy was recommended with aggressive lipid lowering. She was last seen by Dr. Claiborne Billings 04/06/21. She dose not qualify for PCSK9i due to only mild nonobstructive disease. BP medications have been titrated and she underwent renal artery duplex that did not show significant stenosis. BP was not well controlled. HLD was controlled with zetia and 5 mg crestor three times weekly.   She presents today with complaints of many side effects of all of her medications. She is having bruising, constant UTI, cold and tingling in hands and feet, fatigue, dizziness, brain fog/confusion, and muscle aches. She  states that she has no life and wants off of all medications. She states she can't walk when she wakes up in the morning. She just had a back injection "that didn't work" and is seeing neurology this week.  Current list includes: Zetia 10 mg 12.5 mg toprol 12.5 mg spiro every other day 40 mg olmesartan 10 mg norvasc 25 mg hydralazine TID  BP running at home in the 150-160s, not controlled.   Not taking crestor or ASA.    She attributes her UTIs to starting olmesartan.   We discussed that we need a plan in place to control her BP. She is very frustrated and tells me she wants to stop everything. She brings paperwork with her in which she has written all side effects to all medications with reported side effects from the Internet. I do not doubt that she is having significant side effects, but am hesitant to attribute all of her ailments to the BP medications. Below is the plan we decided on:  D/C toprol D/C spironolactone Reduce olmesartan to 20 mg Increase hydralazine to 50 mg TID See pharmD in HTN clinic Repeat CBC, BMP, TSH in 2 weeks    Past Medical History:  Diagnosis Date   ADHD    Anxiety    on meds   Back pain    Chronic female pelvic pain    Coronary artery disease    mild, non-obstructive 11/2018   Depression    on meds   Fibromyalgia    H/O leukocytosis    Headache    Hyperlipidemia    on meds   Hypertension    on meds   MI (myocardial infarction) ([place])    Pt states she did not have a MI- EKG was normal, was GERD   Osteoarthritis    on meds   Ovarian cyst, right    PONV (postoperative nausea and vomiting)    Post-operative nausea and vomiting    SVD (spontaneous vaginal delivery)  x 2   Vitamin D deficiency     Past Surgical History:  Procedure Laterality Date   ABDOMINAL HYSTERECTOMY  1994   TAH.BSO   ANTERIOR CERVICAL DECOMP/DISCECTOMY FUSION  2019   APPENDECTOMY  1975   BACK SURGERY     BIOPSY  05/12/2020   Procedure: BIOPSY;  Surgeon: Milus Banister, MD;  Location: [facility];  Service: Endoscopy;;   CARDIAC CATHETERIZATION  2020   CHOLECYSTECTOMY N/A 07/21/2020   Procedure: LAPAROSCOPIC CHOLECYSTECTOMY WITH INTRAOPERATIVE CHOLANGIOGRAM;  Surgeon: [name], MD;  Location: [facility];  Service: General;  Laterality: N/A;   COLONOSCOPY  08/12/2017   Hx TA (piecemeal)Jacobs-MAC-suprep (good)   ESOPHAGOGASTRODUODENOSCOPY (EGD) WITH PROPOFOL N/A 05/12/2020    Procedure: ESOPHAGOGASTRODUODENOSCOPY (EGD) WITH PROPOFOL;  Surgeon: Milus Banister, MD;  Location: [facility];  Service: Endoscopy;  Laterality: N/A;   EUS N/A 05/12/2020   Procedure: UPPER ENDOSCOPIC ULTRASOUND (EUS) RADIAL;  Surgeon: Milus Banister, MD;  Location: [facility];  Service: Endoscopy;  Laterality: N/A;   KNEE SURGERY Bilateral 1996   x 2 - arthroscopic   LEFT HEART CATH AND CORONARY ANGIOGRAPHY N/A 11/25/2018   Procedure: LEFT HEART CATH AND CORONARY ANGIOGRAPHY;  Surgeon: Troy Sine, MD;  Location: [facility];  Service: Cardiovascular;  Laterality: N/A;   PELVIC LAPAROSCOPY  1989   W LYSIS OF ADHESIONS/L SALPINGONEOSTOMY   TUBAL LIGATION     WISDOM TOOTH EXTRACTION      Current Medications: Current Meds  Medication Sig   ALPRAZolam (XANAX) 0.5 MG tablet Take 0.5 mg by mouth at bedtime as needed for anxiety or sleep.    amLODipine (NORVASC) 10 MG tablet TAKE 1 TABLET(10 MG) BY MOUTH DAILY   Ascorbic Acid (VITAMIN C) 1000 MG tablet Take 1,000 mg by mouth daily.   butalbital-acetaminophen-caffeine (FIORICET, ESGIC) 50-325-40 MG tablet Take 1 tablet by mouth every 6 (six) hours as needed for headache.    Cholecalciferol (VITAMIN D) 50 MCG (2000 UT) tablet Take 2,000 Units by mouth daily.    ezetimibe (ZETIA) 10 MG tablet TAKE 1 TABLET(10 MG) BY MOUTH DAILY   HYDROcodone-acetaminophen (NORCO) 10-325 MG tablet Take 1 tablet by mouth 2 (two) times daily as needed for moderate pain.    Multiple Vitamin (MULTIVITAMIN WITH MINERALS) TABS tablet Take 1 tablet by mouth daily.   valACYclovir (VALTREX) 500 MG tablet TAKE 1 TABLET BY MOUTH TWICE DAILY FOR 3 TO 5 DAYS THEN TAKE DAILY AS NEEDED (Patient taking differently: Take 500 mg by mouth daily as needed (shingles).)   zolpidem (AMBIEN) 10 MG tablet Take 10 mg by mouth at bedtime as needed for sleep.    [DISCONTINUED] hydrALAZINE (APRESOLINE) 25 MG tablet Take 1 tablet (25 mg total) by mouth 3 (three) times daily.    [DISCONTINUED] metoprolol succinate (TOPROL XL) 25 MG 24 hr tablet Take 0.5 tablets (12.5 mg total) by mouth daily.   [DISCONTINUED] olmesartan (BENICAR) 40 MG tablet Take 1 tablet (40 mg total) by mouth daily.   [DISCONTINUED] spironolactone (ALDACTONE) 25 MG tablet Take 1 tablet (25 mg total) by mouth daily. TAKE IN THE AM     Allergies:   Cyclobenzaprine, Tizanidine, and Latex   Social History   Socioeconomic History   Marital status: Married    Spouse name: don   Number of children: 2   Years of education: College   Highest education level: Not on file  Occupational History   Occupation: Realtor  Tobacco Use   Smoking status: Former  Packs/day: 0.15    Years: 20.00    Pack years: 3.00    Types: Cigarettes    Quit date: 11/12/1998    Years since quitting: 22.7   Smokeless tobacco: Never  Vaping Use   Vaping Use: Never used  Substance and Sexual Activity   Alcohol use: Not Currently   Drug use: No   Sexual activity: Not Currently    Birth control/protection: Post-menopausal, Surgical    Comment: HYSTERECTOMY  Other Topics Concern   Not on file  Social History Narrative   Lives at home with her husband Don   Right handed   Caffeine: unsweet tea, 2 glasses daily   Social Determinants of Health   Financial Resource Strain: Not on file  Food Insecurity: Not on file  Transportation Needs: Not on file  Physical Activity: Not on file  Stress: Not on file  Social Connections: Not on file     Family History: The patient's family history includes Aneurysm in her father; COPD in her brother; Heart disease in her paternal grandfather; Other in her sister and sister; Skin cancer in her brother and brother; Uterine cancer (age of onset: 10) in her mother. There is no history of Colon cancer, Rectal cancer, Stomach cancer, Stroke, Neuropathy, Colon polyps, or Esophageal cancer.  ROS:   Please see the history of present illness.     All other systems reviewed and are  negative.  EKGs/Labs/Other Studies Reviewed:    The following studies were reviewed today:  Left heart cath 11/2018: Prox RCA lesion is 20% stenosed. Mid RCA lesion is 20% stenosed. Ost 1st Mrg lesion is 25% stenosed. Prox LAD lesion is 25% stenosed. Mid LAD lesion is 20% stenosed. Dist LAD lesion is 20% stenosed. Mid LM lesion is 5% stenosed.   Mild multi-vessel nonobstructive CAD with narrowings no greater than 25% in the LAD, circumflex marginal, and dominant  RCA.   LVEDP 10 mm Hg.   RECOMMENDATION: Medical therapy.  Aggressive lipid-lowering therapy with target LDL less than 70.  The patient's chest pain has atypical features and is most likely nonischemic.   Echo 10/2018: Study Conclusions   - Left ventricle: Average global longitudinal LV strain is normal    at -18.7% The cavity size was normal. Systolic function was    normal. The estimated ejection fraction was in the range of 60%    to 65%. Wall motion was normal; there were no regional wall    motion abnormalities. The study is not technically sufficient to    allow evaluation of LV diastolic function.  - Aortic valve: Trileaflet; mildly thickened, mildly calcified    leaflets.  - Mitral valve: There was mild regurgitation.  - Atrial septum: There was increased thickness of the septum,    consistent with lipomatous hypertrophy.    EKG:  EKG is not ordered today.   Recent Labs: 10/13/2020: Hemoglobin 12.6; Platelets 388; TSH 0.860 06/15/2021: ALT 7; BUN 16; Creatinine, Ser 1.66; Potassium 5.0; Sodium 137  Recent Lipid Panel    Component Value Date/Time   CHOL 208 (H) 01/20/2021 1230   TRIG 122 01/20/2021 1230   HDL 68 01/20/2021 1230   CHOLHDL 3.1 01/20/2021 1230   LDLCALC 119 (H) 01/20/2021 1230    Physical Exam:    VS:  BP (!) 160/82   Pulse 82   Ht 5' 3.5" (1.613 m)   Wt 162 lb 9.6 oz (73.8 kg)   LMP  (LMP Unknown)   SpO2 98%   BMI  28.35 kg/m     Wt Readings from Last 3 Encounters:  08/14/21  162 lb 9.6 oz (73.8 kg)  04/06/21 163 lb 12.8 oz (74.3 kg)  03/29/21 164 lb (74.4 kg)     GEN:  Well nourished, well developed in no acute distress HEENT: Normal NECK: No JVD; No carotid bruits LYMPHATICS: No lymphadenopathy CARDIAC: RRR, no murmurs, rubs, gallops RESPIRATORY:  Clear to auscultation without rales, wheezing or rhonchi  ABDOMEN: Soft, non-tender, non-distended MUSCULOSKELETAL:  No edema; No deformity  SKIN: Warm and dry NEUROLOGIC:  Alert and oriented x 3 PSYCHIATRIC:  Normal affect   ASSESSMENT:    1. Medication management   2. Coronary artery disease involving native coronary artery of native heart without angina pectoris   3. Fatigue, unspecified type   4. CAD in native artery   5. Hyperlipidemia with target LDL less than 70    PLAN:    In order of problems listed above:  Hypertension - she is maintained on 12.5 mg toprol, 12.5 mg spiro every other day, 40 mg olmesartan, 25 mg hydralazine TID, 10 mg amlodipine - BP at home has been running in the 150-160s - she can't walk and is having severe side effects to all of her medications  Plan: D/C toprol D/C spironolactone Reduce olmesartan to 20 mg Increase hydralazine to 50 mg TID See pharmD in HTN clinic Repeat CBC, BMP, TSH in 2 weeks   CAD  - nonobstructive by heart cath  - continue ASA   Hyperlipidemia with LDL goal < 70 01/20/2021: Cholesterol, Total 208; HDL 68; LDL Chol Calc (NIH) 119; Triglycerides 122 - 10 mg zetia with crestor 5 mg three times weekly   Follow up with labs in 2 weeks, pharmD in HTN clinic in 1 months (she did not want to come sooner.  Medication Adjustments/Labs and Tests Ordered: Current medicines are reviewed at length with the patient today.  Concerns regarding medicines are outlined above.  Orders Placed This Encounter  Procedures   Basic metabolic panel   CBC   TSH   Meds ordered this encounter  Medications   hydrALAZINE (APRESOLINE) 50 MG tablet    Sig:  Take 1 tablet (50 mg total) by mouth 3 (three) times daily.    Dispense:  270 tablet    Refill:  3   olmesartan (BENICAR) 40 MG tablet    Sig: Take 1 tablet (40 mg total) by mouth daily.    Dispense:  90 tablet    Refill:  3    Signed, Ledora Bottcher, [place]  08/14/2021 5:15 PM    [facility]

## 2021-08-14 ENCOUNTER — Ambulatory Visit (INDEPENDENT_AMBULATORY_CARE_PROVIDER_SITE_OTHER): Payer: PPO | Admitting: Physician Assistant

## 2021-08-14 ENCOUNTER — Encounter: Payer: Self-pay | Admitting: Physician Assistant

## 2021-08-14 ENCOUNTER — Other Ambulatory Visit: Payer: Self-pay

## 2021-08-14 VITALS — BP 160/82 | HR 82 | Ht 63.5 in | Wt 162.6 lb

## 2021-08-14 DIAGNOSIS — Z79899 Other long term (current) drug therapy: Secondary | ICD-10-CM | POA: Diagnosis not present

## 2021-08-14 DIAGNOSIS — I2510 Atherosclerotic heart disease of native coronary artery without angina pectoris: Secondary | ICD-10-CM

## 2021-08-14 DIAGNOSIS — E785 Hyperlipidemia, unspecified: Secondary | ICD-10-CM | POA: Diagnosis not present

## 2021-08-14 DIAGNOSIS — R5383 Other fatigue: Secondary | ICD-10-CM | POA: Diagnosis not present

## 2021-08-14 MED ORDER — HYDRALAZINE HCL 50 MG PO TABS: 50.0000 mg | ORAL_TABLET | Freq: Three times a day (TID) | ORAL | 3 refills | Status: DC

## 2021-08-14 MED ORDER — OLMESARTAN MEDOXOMIL 40 MG PO TABS: 40.0000 mg | ORAL_TABLET | Freq: Every day | ORAL | 3 refills | Status: DC

## 2021-08-14 NOTE — Patient Instructions (Addendum)
Medication Instructions:  STOP Toprol XL STOP Spironolactone  DECREASE Olmesartan to 20 mg daily INCREASE Hydralazine to 50 mg 3 times a day  *If you need a refill on your cardiac medications before your next appointment, please call your pharmacy*  Lab Work: Your physician recommends that you return for lab work in 2 weeks (08/28/21):  BMET CBC TSH  If you have labs (blood work) drawn today and your tests are completely normal, you will receive your results only by: [place] (if you have MyChart) OR A paper copy in the mail If you have any lab test that is abnormal or we need to change your treatment, we will call you to review the results.  Testing/Procedures: NONE ordered at this time of appointment   Follow-Up: At [facility], you and your health needs are our priority.  As part of our continuing mission to provide you with exceptional heart care, we have created designated Provider Care Teams.  These Care Teams include your primary Cardiologist (physician) and Advanced Practice Providers (APPs -  Physician Assistants and Nurse Practitioners) who all work together to provide you with the care you need, when you need it.   Your next appointment:   4 week(s)  The format for your next appointment:   In Person  Provider:   Pharm D  Other Instructions

## 2021-08-15 NOTE — Telephone Encounter (Signed)
Try to arrange follow-up office with me either the end of November or early December

## 2021-08-16 NOTE — Telephone Encounter (Signed)
Called patient, gave appointment for November 29th.  Patient verbalized understanding.  Thankful for call back- aware to keep appointment with PHARMD.

## 2021-08-22 ENCOUNTER — Ambulatory Visit: Payer: PPO | Admitting: Gastroenterology

## 2021-08-22 ENCOUNTER — Encounter: Payer: Self-pay | Admitting: Gastroenterology

## 2021-08-22 VITALS — BP 128/78 | HR 88 | Ht 62.0 in | Wt 161.6 lb

## 2021-08-22 DIAGNOSIS — Z8719 Personal history of other diseases of the digestive system: Secondary | ICD-10-CM

## 2021-08-22 NOTE — Progress Notes (Signed)
Review of pertinent gastrointestinal problems: 1.  History of precancerous colon polyps.  Colonoscopy 2000 1814 mm ascending tubular adenoma removed in a piecemeal fashion.  Repeat colonoscopy May 2022 showed diverticulosis in the left colon, no polyps.  She was recommended to have repeat colonoscopy at 5-year interval. 2.  Mild acute pancreatitis May 2021.  Unclear etiology initially.  Elevated liver tests on admission, no stones in the gallbladder by ultrasound and also by MRI.  CBD was slightly dilated.  Eventual endoscopic ultrasound July 2021 showed nondilated biliary tree, sludge within.  Also suggested small stones in her gallbladder.  She was recommended to have cholecystectomy which was done by Dr. Barry Dienes January 2021, IOC showed dilated extrahepatic biliary ducts with no definite filling defects.  There was flow of contrast across the ampulla.  Pathology proved that there were indeed gallstones in the gallbladder.   HPI: This is a very pleasant 70 year old woman whom I last saw the time of a colonoscopy about 5 months ago.  She that results summarized above.  Blood work August 2022 showed normal complete metabolic profile except for a creatinine of 1.66 which is gradually increasing in the past year or so.  MRI lumbar spine July 2022 showed "disc and facet degeneration of L4-5 and L5-S1.  No significant stenosis or impingement."  She was having some pains 2 months ago and they were severe.  She thought maybe her pancreas was inflamed again.  Our office advised her to go to the emergency room however she declined that.  Instead she limited her diet to liquids only, push fluids and took some pain medicines at home and after 2 or 3 days she returned to normal.  She has not had any problems since then.  Her biggest issue lately is that she has been falling a lot.  6 times in the past 2 or 3 months.  Her blood pressure meds have been adjusted several times it sounds like.  ROS: complete GI ROS  as described in HPI, all other review negative.  Constitutional:  No unintentional weight loss   Past Medical History:  Diagnosis Date   ADHD    Anxiety    on meds   Back pain    Chronic female pelvic pain    Coronary artery disease    mild, non-obstructive 11/2018   Depression    on meds   Fibromyalgia    H/O leukocytosis    Headache    Hyperlipidemia    on meds   Hypertension    on meds   MI (myocardial infarction) ([place])    Pt states she did not have a MI- EKG was normal, was GERD   Osteoarthritis    on meds   Ovarian cyst, right    PONV (postoperative nausea and vomiting)    Post-operative nausea and vomiting    SVD (spontaneous vaginal delivery)    x 2   Vitamin D deficiency     Past Surgical History:  Procedure Laterality Date   ABDOMINAL HYSTERECTOMY  1994   TAH.BSO   ANTERIOR CERVICAL DECOMP/DISCECTOMY FUSION  2019   APPENDECTOMY  1975   BACK SURGERY     BIOPSY  05/12/2020   Procedure: BIOPSY;  Surgeon: Milus Banister, MD;  Location: [facility];  Service: Endoscopy;;   CARDIAC CATHETERIZATION  2020   CHOLECYSTECTOMY N/A 07/21/2020   Procedure: LAPAROSCOPIC CHOLECYSTECTOMY WITH INTRAOPERATIVE CHOLANGIOGRAM;  Surgeon: [name], MD;  Location: [place] [facility];  Service: General;  Laterality: N/A;   COLONOSCOPY  08/12/2017   Hx TA (piecemeal)Tinea Nobile-MAC-suprep (good)   ESOPHAGOGASTRODUODENOSCOPY (EGD) WITH PROPOFOL N/A 05/12/2020   Procedure: ESOPHAGOGASTRODUODENOSCOPY (EGD) WITH PROPOFOL;  Surgeon: Milus Banister, MD;  Location: [facility];  Service: Endoscopy;  Laterality: N/A;   EUS N/A 05/12/2020   Procedure: UPPER ENDOSCOPIC ULTRASOUND (EUS) RADIAL;  Surgeon: Milus Banister, MD;  Location: [facility];  Service: Endoscopy;  Laterality: N/A;   KNEE SURGERY Bilateral 1996   x 2 - arthroscopic   LEFT HEART CATH AND CORONARY ANGIOGRAPHY N/A 11/25/2018   Procedure: LEFT HEART CATH AND CORONARY ANGIOGRAPHY;  Surgeon: Troy Sine, MD;  Location: [facility];  Service: Cardiovascular;  Laterality: N/A;   PELVIC LAPAROSCOPY  1989   W LYSIS OF ADHESIONS/L SALPINGONEOSTOMY   TUBAL LIGATION     WISDOM TOOTH EXTRACTION      Current Outpatient Medications  Medication Sig Dispense Refill   ALPRAZolam (XANAX) 0.5 MG tablet Take 0.5 mg by mouth at bedtime as needed for anxiety or sleep.      amLODipine (NORVASC) 10 MG tablet TAKE 1 TABLET(10 MG) BY MOUTH DAILY 90 tablet 3   Ascorbic Acid (VITAMIN C) 1000 MG tablet Take 1,000 mg by mouth daily.     butalbital-acetaminophen-caffeine (FIORICET, ESGIC) 50-325-40 MG tablet Take 1 tablet by mouth every 6 (six) hours as needed for headache.   0   Cholecalciferol (VITAMIN D) 50 MCG (2000 UT) tablet Take 2,000 Units by mouth daily.      ezetimibe (ZETIA) 10 MG tablet TAKE 1 TABLET(10 MG) BY MOUTH DAILY 90 tablet 3   hydrALAZINE (APRESOLINE) 50 MG tablet Take 1 tablet (50 mg total) by mouth 3 (three) times daily. 270 tablet 3   HYDROcodone-acetaminophen (NORCO) 10-325 MG tablet Take 1 tablet by mouth 2 (two) times daily as needed for moderate pain.      Multiple Vitamin (MULTIVITAMIN WITH MINERALS) TABS tablet Take 1 tablet by mouth daily.     olmesartan (BENICAR) 40 MG tablet Take 1 tablet (40 mg total) by mouth daily. 90 tablet 3   valACYclovir (VALTREX) 500 MG tablet TAKE 1 TABLET BY MOUTH TWICE DAILY FOR 3 TO 5 DAYS THEN TAKE DAILY AS NEEDED (Patient taking differently: Take 500 mg by mouth daily as needed (shingles).) 30 tablet 0   zolpidem (AMBIEN) 10 MG tablet Take 10 mg by mouth at bedtime as needed for sleep.      No current facility-administered medications for this visit.    Allergies as of 08/22/2021 - Review Complete 08/22/2021  Allergen Reaction Noted   Cyclobenzaprine Anaphylaxis, Hives, Itching, and Swelling 03/13/2021   Tizanidine Anaphylaxis, Hives, Itching, and Swelling 03/13/2021   Latex Rash 05/23/2017    Family History  Problem Relation Age of Onset   Uterine cancer Mother 70    Aneurysm Father    Heart disease Paternal Grandfather    COPD Brother    Skin cancer Brother    Other Sister        MGUS    Skin cancer Brother    Other Sister        MA   Colon cancer Neg Hx    Rectal cancer Neg Hx    Stomach cancer Neg Hx    Stroke Neg Hx    Neuropathy Neg Hx    Colon polyps Neg Hx    Esophageal cancer Neg Hx     Social History   Socioeconomic History   Marital status: Married    Spouse name: don  Number of children: 2   Years of education: College   Highest education level: Not on file  Occupational History   Occupation: Realtor  Tobacco Use   Smoking status: Former    Packs/day: 0.15    Years: 20.00    Pack years: 3.00    Types: Cigarettes    Quit date: 11/12/1998    Years since quitting: 22.7   Smokeless tobacco: Never  Vaping Use   Vaping Use: Never used  Substance and Sexual Activity   Alcohol use: Not Currently   Drug use: No   Sexual activity: Not Currently    Birth control/protection: Post-menopausal, Surgical    Comment: HYSTERECTOMY  Other Topics Concern   Not on file  Social History Narrative   Lives at home with her husband Don   Right handed   Caffeine: unsweet tea, 2 glasses daily   Social Determinants of Health   Financial Resource Strain: Not on file  Food Insecurity: Not on file  Transportation Needs: Not on file  Physical Activity: Not on file  Stress: Not on file  Social Connections: Not on file  Intimate Partner Violence: Not on file     Physical Exam: Ht _0  (1.575 m)   Wt 161 lb 9.6 oz (73.3 kg)   LMP  (LMP Unknown)   BMI 29.56 kg/m  Constitutional: generally well-appearing Psychiatric: alert and oriented x3 Abdomen: soft, nontender, nondistended, no obvious ascites, no peritoneal signs, normal bowel sounds No peripheral edema noted in lower extremities  Assessment and plan: 70 y.o. female with history of gallstone pancreatitis  It is certainly possible that she suffered another acute pancreatitis  event 2 months ago.  She has felt completely normal since then.  Unfortunately she did not come in for any testing so we cannot be really certain.  I recommended that if she has severe pain again she thinks her pancreas is irritated it certainly could be and she should call here.  At that time she would need at least blood work and possibly some imaging as well.  This would include CBC, complete metabolic profile, lipase and amylase.  She understands that we might then recommend she go to the emergency room.  If she does prove to have recurrent acute pancreatitis I would have to wonder if she has choledocholithiasis.    Please see the "Patient Instructions" section for addition details about the plan.  Owens Loffler, MD [facility] 08/22/2021, 11:03 AM   Total time on date of encounter was 30 minutes (this included time spent preparing to see the patient reviewing records; obtaining and/or reviewing separately obtained history; performing a medically appropriate exam and/or evaluation; counseling and educating the patient and family if present; ordering medications, tests or procedures if applicable; and documenting clinical information in the health record).

## 2021-08-22 NOTE — Patient Instructions (Addendum)
If you are age 70 or older, your body mass index should be between 23-30. Your Body mass index is 29.56 kg/m. If this is out of the aforementioned range listed, please consider follow up with your Primary Care Provider. _________________________________________________________  The [facility] providers would like to encourage you to use [facility] to communicate with providers for non-urgent requests or questions.  Due to long hold times on the telephone, sending your provider a message by [facility] may be a faster and more efficient way to get a response.  Please allow 48 business hours for a response.  Please remember that this is for non-urgent requests.   Please call our office if you have pain again, and you think your pancreas is irritated.  At that time we will have you come into the office for blood work (CBC, CMET, Amylase, Lipase).  Thank you for entrusting me with your care and choosing [facility].  Dr Ardis Hughs

## 2021-08-24 DIAGNOSIS — M542 Cervicalgia: Secondary | ICD-10-CM | POA: Diagnosis not present

## 2021-08-24 DIAGNOSIS — M5416 Radiculopathy, lumbar region: Secondary | ICD-10-CM | POA: Diagnosis not present

## 2021-08-24 DIAGNOSIS — R519 Headache, unspecified: Secondary | ICD-10-CM | POA: Diagnosis not present

## 2021-08-24 DIAGNOSIS — M5136 Other intervertebral disc degeneration, lumbar region: Secondary | ICD-10-CM | POA: Diagnosis not present

## 2021-08-28 DIAGNOSIS — R5383 Other fatigue: Secondary | ICD-10-CM | POA: Diagnosis not present

## 2021-08-28 DIAGNOSIS — I2510 Atherosclerotic heart disease of native coronary artery without angina pectoris: Secondary | ICD-10-CM | POA: Diagnosis not present

## 2021-08-28 DIAGNOSIS — Z79899 Other long term (current) drug therapy: Secondary | ICD-10-CM | POA: Diagnosis not present

## 2021-08-29 LAB — CBC
Hematocrit: 32.8 % — ABNORMAL LOW (ref 34.0–46.6)
Hemoglobin: 11.2 g/dL (ref 11.1–15.9)
MCH: 32.5 pg (ref 26.6–33.0)
MCHC: 34.1 g/dL (ref 31.5–35.7)
MCV: 95 fL (ref 79–97)
Platelets: 304 10*3/uL (ref 150–450)
RBC: 3.45 x10E6/uL — ABNORMAL LOW (ref 3.77–5.28)
RDW: 12.5 % (ref 11.7–15.4)
WBC: 9.7 10*3/uL (ref 3.4–10.8)

## 2021-08-29 LAB — BASIC METABOLIC PANEL
BUN/Creatinine Ratio: 13 (ref 12–28)
BUN: 16 mg/dL (ref 8–27)
CO2: 21 mmol/L (ref 20–29)
Calcium: 9.3 mg/dL (ref 8.7–10.3)
Chloride: 99 mmol/L (ref 96–106)
Creatinine, Ser: 1.22 mg/dL — ABNORMAL HIGH (ref 0.57–1.00)
Glucose: 102 mg/dL — ABNORMAL HIGH (ref 70–99)
Potassium: 3.7 mmol/L (ref 3.5–5.2)
Sodium: 136 mmol/L (ref 134–144)
eGFR: 48 mL/min/{1.73_m2} — ABNORMAL LOW (ref >59)

## 2021-08-29 LAB — TSH: TSH: 1.07 u[IU]/mL (ref 0.450–4.500)

## 2021-09-11 ENCOUNTER — Ambulatory Visit: Payer: PPO

## 2021-09-11 NOTE — Progress Notes (Deleted)
09/11/2021 Robin Arellano 07/02/1951 [id number]   HPI:  Robin Arellano is a 70 y.o. female patient of Dr Claiborne Billings, with a PMH below who presents today for hypertension clinic evaluation.  Past Medical History: CAD   hyperlipidemia               Blood Pressure Goal:  130/80  Current Medications:  Family Hx:  Social Hx:   Diet:   Exercise:   Home BP readings:   Intolerances:   Labs: 10/22:  Na 136, K 3.7, Glu 102, BUN 16, SCr 1.22   Wt Readings from Last 3 Encounters:  08/22/21 161 lb 9.6 oz (73.3 kg)  08/14/21 162 lb 9.6 oz (73.8 kg)  04/06/21 163 lb 12.8 oz (74.3 kg)   BP Readings from Last 3 Encounters:  08/22/21 128/78  08/14/21 (!) 160/82  04/06/21 (!) 150/78   Pulse Readings from Last 3 Encounters:  08/22/21 88  08/14/21 82  04/06/21 (!) 54    Current Outpatient Medications  Medication Sig Dispense Refill   ALPRAZolam (XANAX) 0.5 MG tablet Take 0.5 mg by mouth at bedtime as needed for anxiety or sleep.      amLODipine (NORVASC) 10 MG tablet TAKE 1 TABLET(10 MG) BY MOUTH DAILY 90 tablet 3   Ascorbic Acid (VITAMIN C) 1000 MG tablet Take 1,000 mg by mouth daily.     butalbital-acetaminophen-caffeine (FIORICET, ESGIC) 50-325-40 MG tablet Take 1 tablet by mouth every 6 (six) hours as needed for headache.   0   Cholecalciferol (VITAMIN D) 50 MCG (2000 UT) tablet Take 2,000 Units by mouth daily.      ezetimibe (ZETIA) 10 MG tablet TAKE 1 TABLET(10 MG) BY MOUTH DAILY 90 tablet 3   hydrALAZINE (APRESOLINE) 50 MG tablet Take 1 tablet (50 mg total) by mouth 3 (three) times daily. 270 tablet 3   HYDROcodone-acetaminophen (NORCO) 10-325 MG tablet Take 1 tablet by mouth 2 (two) times daily as needed for moderate pain.      Multiple Vitamin (MULTIVITAMIN WITH MINERALS) TABS tablet Take 1 tablet by mouth daily.     olmesartan (BENICAR) 40 MG tablet Take 1 tablet (40 mg total) by mouth daily. 90 tablet 3   valACYclovir (VALTREX) 500 MG tablet TAKE 1  TABLET BY MOUTH TWICE DAILY FOR 3 TO 5 DAYS THEN TAKE DAILY AS NEEDED (Patient taking differently: Take 500 mg by mouth daily as needed (shingles).) 30 tablet 0   zolpidem (AMBIEN) 10 MG tablet Take 10 mg by mouth at bedtime as needed for sleep.      No current facility-administered medications for this visit.    Allergies  Allergen Reactions   Cyclobenzaprine Anaphylaxis, Hives, Itching and Swelling   Tizanidine Anaphylaxis, Hives, Itching and Swelling   Latex Rash    Past Medical History:  Diagnosis Date   ADHD    Anxiety    on meds   Back pain    Chronic female pelvic pain    Coronary artery disease    mild, non-obstructive 11/2018   Depression    on meds   Fibromyalgia    H/O leukocytosis    Headache    Hyperlipidemia    on meds   Hypertension    on meds   MI (myocardial infarction) ([place])    Pt states she did not have a MI- EKG was normal, was GERD   Osteoarthritis    on meds   Ovarian cyst, right    PONV (postoperative nausea and  vomiting)    Post-operative nausea and vomiting    SVD (spontaneous vaginal delivery)    x 2   Vitamin D deficiency     There were no vitals taken for this visit.  No problem-specific Assessment & Plan notes found for this encounter.   Tommy Medal PharmD CPP [facility] [address] [place], [place] [address] [phone]

## 2021-09-29 DIAGNOSIS — N390 Urinary tract infection, site not specified: Secondary | ICD-10-CM | POA: Diagnosis not present

## 2021-10-03 ENCOUNTER — Other Ambulatory Visit: Payer: Self-pay

## 2021-10-03 ENCOUNTER — Ambulatory Visit (INDEPENDENT_AMBULATORY_CARE_PROVIDER_SITE_OTHER): Payer: PPO | Admitting: Pharmacist

## 2021-10-03 VITALS — BP 205/82 | HR 63 | Resp 14 | Ht 62.5 in | Wt 160.2 lb

## 2021-10-03 DIAGNOSIS — I10 Essential (primary) hypertension: Secondary | ICD-10-CM | POA: Diagnosis not present

## 2021-10-03 NOTE — Progress Notes (Signed)
Patient ID: Robin Arellano                 DOB: December 29, 1950                      MRN: [id number]     HPI: Robin Arellano is a 70 y.o. female referred by Fabian Sharp to HTN clinic. PMH is significant for CAD, HTN, degenerative disc disease, muscle pain, and HLD. Patient seen last month by Fabian Sharp and reported she did not want to be on any medications because they all cause side effects.  Metoprolol and spironolactone were d/c and patient remained on amlodipine, olmesartan, and hydralazine.  Patient presents today with back pain and a UTI which she believes was caused by the olmesartan.  Blood pressure very elevated in room but she reports she has white coat hypertension.  Still does not want to take any medications.  Of note, patient is on hydrocodone/APAP three times daily.  Patient also reports stress and anxiety. Reports her daughter and grandchild may be moving in with her shortly.  Takes Xanax nightly to sleep at 8pm and falls asleep quickly afterwards.     Believes her blood pressure this afternoon was 187/88 but did not bring machine or log. Report compliance with amlodipine, hydralazine, and olmesartan despite not wanting to take them.  Reports the only time she feels well is when she takes her Xanax.  Current HTN meds:   amlodpine 10mg  daily hydralazine 50mg  TID olemsartan 40mg  daily  Previously tried:   Metoprolol succinate Spironolactone  BP goal: <130/80  Wt Readings from Last 3 Encounters:  08/22/21 161 lb 9.6 oz (73.3 kg)  08/14/21 162 lb 9.6 oz (73.8 kg)  04/06/21 163 lb 12.8 oz (74.3 kg)   BP Readings from Last 3 Encounters:  08/22/21 128/78  08/14/21 (!) 160/82  04/06/21 (!) 150/78   Pulse Readings from Last 3 Encounters:  08/22/21 88  08/14/21 82  04/06/21 (!) 54    Renal function: CrCl cannot be calculated (Patient's most recent lab result is older than the maximum 21 days allowed.).  Past Medical History:  Diagnosis Date   ADHD     Anxiety    on meds   Back pain    Chronic female pelvic pain    Coronary artery disease    mild, non-obstructive 11/2018   Depression    on meds   Fibromyalgia    H/O leukocytosis    Headache    Hyperlipidemia    on meds   Hypertension    on meds   MI (myocardial infarction) ([place])    Pt states she did not have a MI- EKG was normal, was GERD   Osteoarthritis    on meds   Ovarian cyst, right    PONV (postoperative nausea and vomiting)    Post-operative nausea and vomiting    SVD (spontaneous vaginal delivery)    x 2   Vitamin D deficiency     Current Outpatient Medications on File Prior to Visit  Medication Sig Dispense Refill   ALPRAZolam (XANAX) 0.5 MG tablet Take 0.5 mg by mouth at bedtime as needed for anxiety or sleep.      amLODipine (NORVASC) 10 MG tablet TAKE 1 TABLET(10 MG) BY MOUTH DAILY 90 tablet 3   Ascorbic Acid (VITAMIN C) 1000 MG tablet Take 1,000 mg by mouth daily.     butalbital-acetaminophen-caffeine (FIORICET, ESGIC) 50-325-40 MG tablet Take 1 tablet by mouth every 6 (  six) hours as needed for headache.   0   Cholecalciferol (VITAMIN D) 50 MCG (2000 UT) tablet Take 2,000 Units by mouth daily.      ezetimibe (ZETIA) 10 MG tablet TAKE 1 TABLET(10 MG) BY MOUTH DAILY 90 tablet 3   hydrALAZINE (APRESOLINE) 50 MG tablet Take 1 tablet (50 mg total) by mouth 3 (three) times daily. 270 tablet 3   HYDROcodone-acetaminophen (NORCO) 10-325 MG tablet Take 1 tablet by mouth 2 (two) times daily as needed for moderate pain.      Multiple Vitamin (MULTIVITAMIN WITH MINERALS) TABS tablet Take 1 tablet by mouth daily.     olmesartan (BENICAR) 40 MG tablet Take 1 tablet (40 mg total) by mouth daily. 90 tablet 3   valACYclovir (VALTREX) 500 MG tablet TAKE 1 TABLET BY MOUTH TWICE DAILY FOR 3 TO 5 DAYS THEN TAKE DAILY AS NEEDED (Patient taking differently: Take 500 mg by mouth daily as needed (shingles).) 30 tablet 0   zolpidem (AMBIEN) 10 MG tablet Take 10 mg by mouth at bedtime as  needed for sleep.      No current facility-administered medications on file prior to visit.    Allergies  Allergen Reactions   Cyclobenzaprine Anaphylaxis, Hives, Itching and Swelling   Tizanidine Anaphylaxis, Hives, Itching and Swelling   Latex Rash     Assessment/Plan:  1. Hypertension -  Patient blood pressure extremely elevated today at 205/82 which is not surprising since she has stopped multiple medications and is reporting increased pain and stress.  Is resistant to starting or increasing medications. Advised it was unlikely her symptoms are caused by olmesartan.   Since patient reports she only feels well after she takes her Xanax, recommended she take her blood pressure an hour after taking it to see how big an impact stress/anxiety is having on her blood pressure. Has appointment with Dr Claiborne Billings in 1 week.  Continue: amlodpine 10mg  daily hydralazine 50mg  TID olemsartan 40mg  daily Recheck as needed  Karren Cobble, PharmD, [place], [place], [place], [place], [place], [address] Phone: [phone], Fax: [phone]

## 2021-10-03 NOTE — Patient Instructions (Addendum)
It was nice meeting you today  I am not sure why your blood pressure is elevated but maybe it is due to stress and anxiety.   Tonight when you get home, take your Xanax and take your blood pressure about an hour later  I will call you in the morning before 10 o clock to see what your readings are  Please give [place] a call with any questions  Karren Cobble, PharmD, [place], [place], [place], [place], [place], [address] Phone: [phone], Fax: [phone]

## 2021-10-04 ENCOUNTER — Telehealth: Payer: Self-pay | Admitting: Pharmacist

## 2021-10-04 DIAGNOSIS — I10 Essential (primary) hypertension: Secondary | ICD-10-CM

## 2021-10-04 MED ORDER — HYDRALAZINE HCL 100 MG PO TABS: 100.0000 mg | ORAL_TABLET | Freq: Three times a day (TID) | ORAL | 1 refills | Status: DC

## 2021-10-04 NOTE — Telephone Encounter (Signed)
Called patient to check on BP readings.  Reports one hour after her alprazolam her BP was 178/80.  One hour after her morning medications her blood pressure was 177/76.  Recommended increasing hydralazine to 100mg  TID.  Advised she can take 2 of her 50mg  tablets TID until she picks up refill.  Patient voiced understanding.

## 2021-10-10 ENCOUNTER — Encounter: Payer: Self-pay | Admitting: Cardiovascular Disease

## 2021-10-10 ENCOUNTER — Ambulatory Visit (INDEPENDENT_AMBULATORY_CARE_PROVIDER_SITE_OTHER): Payer: PPO | Admitting: Cardiovascular Disease

## 2021-10-10 ENCOUNTER — Other Ambulatory Visit: Payer: Self-pay

## 2021-10-10 DIAGNOSIS — R5383 Other fatigue: Secondary | ICD-10-CM

## 2021-10-10 DIAGNOSIS — I2510 Atherosclerotic heart disease of native coronary artery without angina pectoris: Secondary | ICD-10-CM

## 2021-10-10 DIAGNOSIS — Z79899 Other long term (current) drug therapy: Secondary | ICD-10-CM

## 2021-10-10 DIAGNOSIS — E785 Hyperlipidemia, unspecified: Secondary | ICD-10-CM

## 2021-10-10 DIAGNOSIS — E782 Mixed hyperlipidemia: Secondary | ICD-10-CM

## 2021-10-10 DIAGNOSIS — I10 Essential (primary) hypertension: Secondary | ICD-10-CM | POA: Diagnosis not present

## 2021-10-10 MED ORDER — DOXAZOSIN MESYLATE 2 MG PO TABS: 1.0000 mg | ORAL_TABLET | Freq: Every day | ORAL | 11 refills | Status: DC

## 2021-10-10 NOTE — Patient Instructions (Signed)
Medication Instructions:  START: Doxazosin 1 mg (1/2 tablet) daily   *If you need a refill on your cardiac medications before your next appointment, please call your pharmacy*   Lab Work: None ordered    Testing/Procedures: None ordered   Follow-Up: At [facility], you and your health needs are our priority.  As part of our continuing mission to provide you with exceptional heart care, we have created designated Provider Care Teams.  These Care Teams include your primary Cardiologist (physician) and Advanced Practice Providers (APPs -  Physician Assistants and Nurse Practitioners) who all work together to provide you with the care you need, when you need it.  We recommend signing up for the patient portal called "MyChart".  Sign up information is provided on this After Visit Summary.  MyChart is used to connect with patients for Virtual Visits (Telemedicine).  Patients are able to view lab/test results, encounter notes, upcoming appointments, etc.  Non-urgent messages can be sent to your provider as well.   To learn more about what you can do with MyChart, go to [email or web address].    Your next appointment:   6 -8 week(s)  The format for your next appointment:   In Person  Provider: Pharm D, then [facility] in 4 months}

## 2021-10-10 NOTE — Progress Notes (Signed)
Cardiology Office Note    Date:  10/17/2021   ID:  Robin Arellano, Robin Arellano 29-May-1951, MRN [id number]  PCP:  Sandi Mariscal, MD  Cardiologist:  Shelva Majestic, MD   F/U evaluation initally referred by Dr.Yun Nancy Fetter at [facility] [place]  History of Present Illness:  Robin Arellano is a 70 y.o. female who presents for 6 month follow-up cardiology evaluation.  I had seen remotely Robin Arellano in early 1994 with atypical chest pain. She was recently evaluated on September 22, 2018 by Dr. Aletta Edouard at [facility] with complaints of intermittent chest pain has been occurring off and on over the past year.  Prior to that evaluation, she was evaluated by her PCP and was told that she may have had an old heart attack based on her ECG.  During her ER evaluation she was significantly hypertensive with a blood pressure 187/100.  A chest x-ray did not show any acute abnormalities.  She had experienced some transient left facial numbness for 5 days associated with some dizziness and a CT was done which revealed atrophy with small vessel chronic ischemic changes of deep cerebral white matter without acute intracranial abnormalities.  She has recently been evaluated at [facility] on [place] by Dr. Theressa Millard and she is felt to have prediabetes.  Lipid studies were increased with a total cholesterol of 319, triglycerides 209, LDL cholesterol 224, and she was told to initiate Crestor 10 mg which she has not yet started.  Her sedimentation rate was elevated at 64.  ANA was negative.  RF was normal.  Thyroid studies were normal.  Because of her recent symptomatology, she was referred for cardiology evaluation.  When I saw her in November 2019 she complained of some intermittent chest discomfort which occurs in the center of her chest and seems to radiate to her left shoulder. It is aggravated when she turns a certain way and may last for hours and then ultimately resolves on its own.  She was  told that this may be from "Iinside shingles."   She denied any exertional symptomatology.  Additional history is notable for chronic bilateral low back pain with bilateral sciatica.  She also  has fibromyalgia and osteoarthritis.  Has a history of anxiety and depression.  During my evaluation, I reviewed laboratory from her primary physician which suggested a high likelihood for familial hyperlipidemia with a total cholesterol at 319, LDL cholesterol at 224, triglycerides of 209, and HDL of 53.  With her recurrent chest pain which most likely had musculoskeletal features and her significant hyperlipidemia I recommended she undergo coronary CT a to evaluate potential coronary atherosclerosis and recommended an echo Doppler evaluation particularly with her hypertensive history.  The echo Doppler study was done on October 14, 2018 and showed a normal LV function and normal LV strain with an EF of 60 to 65%.  There was mild aortic valve sclerosis without stenosis, mild MR, and a mildly thickened atrial septum consistent with lipomatous hypertrophy.  She underwent coronary CTA on November 06, 2018 which showed moderate coronary plaque with moderate tubular atherosclerosis in the proximal RCA and mid RCA in the 50 to 69% range.  Left main coronary artery had mild plaque less than 25%.  Her LAD had mild proximal plaque in the 25 to 49% range, also had a moderate long segment of atherosclerotic plaque in the mid LAD just beyond the second diagonal vessel of 50 to 69%.  She was felt to have possible  severe stenosis in the distal LAD with 70 to 99% plaque.  She had a small caliber ramus intermediate vessel.  The circumflex had mild to moderate plaque of 25 to 49% proximally, and then 50 to 69% stenosis.  Subsequent FFR analysis not reveal any significant flow-limiting stenosis with the exception of the distal LAD where the FFR was 0.57.   When I saw her November 20, 2018 she denied any classic exertional chest pain.  Her  chest pain has been recurring and is described as being sharp and knifelike.  Typically it is of short duration.  She also has recently started rosuvastatin 20 mg and has begun to notice some calf discomfort attributed to initiation of statin therapy.    With her abnormal coronary CT angiogram definitive cardiac catheterization was recommended.  She underwent this catheterization by me on November 25, 2018 which revealed mild multivessel nonobstructive CAD with narrowings no greater than 25% in the LAD, circumflex marginal, and dominant RCA.  Medical therapy was recommended with aggressive lipid-lowering therapy with target LDL less than 70.  It was felt that the patient's chest pain had atypical features and most likely was nonischemic.  After her catheterization she ultimately underwent neurologic evaluation and subsequent MRI of her cervical spine which showed mild multilevel degenerative spondylolisthesis in the cervical spine with associated widespread cervical facet arthropathy.  There was superimposed cervical disc and endplate degeneration maximal at C5 and 6 but no associated cervical spinal stenosis.  There was moderate neuroforaminal stenosis at the right C4 right C5 bilateral C6 and left C7 nerve levels.  He has been seeing Dr. Maryjean Ka for pain management and had undergone epidural injection.  She also sees Dr. Vertell Limber.  She denies any exertional chest pain.  She has continued to take rosuvastatin for hyperlipidemia.  She was evaluated by me in a telemedicine encounter on February 16, 2020.  Since her prior evaluation she had undergone successful cervical disc surgery by Dr. Vertell Limber.  Prior to surgery she was having severe pain bilaterally in her arms.  This has essentially resolved.  She is now able to be more active.  Prior to her surgery she could not walk well due to significant pain.  She also tells me that she does have issues with her lower back and in August will be undergoing an MRI and may also be  undergoing an injection.  If symptoms progress she may require future low back surgery.   At her telemedicine visit, she denied any chest pain.  Apparently she had only been taking  metoprolol tartrate 25 mg once a day instead of as prescribed twice a day.  She has noticed some blood pressure elevation.  She denies any swelling.  She denies any anginal symptoms.  During her recent injection her blood pressure did significantly increase.  During that evaluation I suggested changing to metoprolol succinate initially 25 mg but her dose may need to be further titrated to 50 mg.  At that time she was on rosuvastatin 5 mg with target LDL less than 70.  She was hospitalized for 6 days in May 2021 with a diagnosis of acute pancreatitis and common bile duct dilatation.  I last saw her in July 2021 at which time she remained stable from a cardiac standpoint and was scheduled to see  Dr. Barry Dienes at [facility] surgery for possible future cholecystectomy.  During her hospitalization, she was taken off rosuvastatin and also was taken off metoprolol succinate.  She denied chest pain, PND  orthopnea.  During that evaluation, her blood pressure was elevated and I recommended resumption of metoprolol tartrate 25 mg twice a day.  Also with discontinuance of rosuvastatin I recommended initiation of Zetia 10 mg daily.  She underwent successful cholecystectomy on July 26, 2020.  She tolerated this well.  She had been off Crestor since May.    When I saw  her on September 08, 2020 her blood pressure was significantly elevated at 190/78 and on repeat by me was 180/78.  She was on amlodipine 10 mg and lisinopril 5 mg and had only been taking metoprolol 12.5 mg twice a day.  At that time I recommended she discontinue lisinopril and started her on olmesartan 20 mg.  I recommended follow-up laboratory.   Laboratory in December showed a total cholesterol 262, triglycerides 197, LDL 160 and HDL 36.  Creatinine was 1.15.  TSH was  normal.  She was subsequently seen by Joslyn Hy, [facility] on November 24, 2020. I saw her on December 19, 2020.  She was felt not eligible for PCSK9 inhibition due to her nonobstructive CAD where she was only found to have mild multivessel disease with up to 25% narrowings.  She was rechallenged with Crestor 5 mg 3 times per week.  She dramatically changed her diet and stopped eating red meat and has been having predominantly chicken and salads.  Her blood pressure has improved but remained elevated and at home is in the 160-170 range.  During that evaluation with her continued blood pressure elevation I recommended further titration of olmesartan to 40 mg daily and increased her metoprolol to 25 mg twice a day.  She was rechallenged with low-dose rosuvastatin and was tolerating this fairly well.  I saw her on February 17, 2021.  At that time she denied any chest pain or shortness of breath.  She had issues with low back discomfort and increased stress which was associated with blood pressure lability.  Her blood pressure was increased and on repeat by me was 190/84.  At that time I recommended initiation of hydralazine 25 mg every 8 hours as well as spironolactone 12.5 mg daily.  She already was on amlodipine 10 mg, metoprolol tartrate 25 mg in the morning and 12.5 mg at night in addition to olmesartan 40 mg daily.  I scheduled her for renal duplex study to evaluate for potential renal vascular etiology.  She was tolerating rosuvastatin 3 days/week and I recommended slight titration to every other day at the 5 mg dose and she continues to be on Zetia 10 mg daily.  She was seen by Greg Cutter on Mar 14, 2021 for Pharm.D. follow-up evaluation.  She had been started on the spironolactone 12.5 mg daily.  I follow-up bmet showed a BUN of 16 and creatinine 1.34, slightly increased from 1.142 months previously.  Renal duplex study did not demonstrate evidence for renal artery stenosis.  She had abnormal right resistive  index and normal cortical thickness of the right kidney.  The left kidney appeared somewhat atrophic.  There was a normal left resistive index and normal cortical thickness.  I last saw her on Apr 06, 2021 which her blood pressure was improved but at times she states it may be elevated around 150/78.  She had undergone colonoscopy last week and tolerated that well.  She admits to fatigability.  She has been taking metoprolol titrate 12.5 mg twice a day, amlodipine 10 mg, hydralazine 25 mg every 8 hours, olmesartan 40 mg, spironolactone 12.5 mg,  and combination of Zetia and rosuvastatin 5 mg up to 3 times per week.  During that evaluation I suggested slight titration of spironolactone the 225 mg daily.  I recommended that she change her metoprolol to metoprolol succinate 12.5 mg and she take this at bedtime.  She was continuing to tolerate rosuvastatin 5 mg 3 times per week in addition to Zetia 10 mg daily.  Since I saw her, she was evaluated by Fabian Sharp, PA-C in October 2022 and Rollen Sox, [facility] on October 03, 2021 a Apparently she was not wanting to take medications.  Prior to his visit her metoprolol and spironolactone were discontinued and she remained on amlodipine 10 mg, olmesartan  40 mg and hydralazine 50 mg 3 times daily.  During his evaluation her blood pressure was extremely elevated at 205/82 and it was recommended that hydralazine be increased to 100 mg 3 times a day.  Presently, her blood pressure has remained elevated but is somewhat improved.  She admits to back pain and continues to have blood pressure lability.  She continues to be on amlodipine 10 mg, hydralazine 100 mg 3 times a day, and olmesartan 40 mg daily.  She also is on Fioricet as needed for back pain and continues to be on 10 mg.  She presents for evaluation.  Use or  Past Medical History:  Diagnosis Date   ADHD    Anxiety    on meds   Back pain    Chronic female pelvic pain    Coronary artery disease    mild,  non-obstructive 11/2018   Depression    on meds   Fibromyalgia    H/O leukocytosis    Headache    Hyperlipidemia    on meds   Hypertension    on meds   MI (myocardial infarction) ([place])    Pt states she did not have a MI- EKG was normal, was GERD   Osteoarthritis    on meds   Ovarian cyst, right    PONV (postoperative nausea and vomiting)    Post-operative nausea and vomiting    SVD (spontaneous vaginal delivery)    x 2   Vitamin D deficiency     Past Surgical History:  Procedure Laterality Date   ABDOMINAL HYSTERECTOMY  1994   TAH.BSO   ANTERIOR CERVICAL DECOMP/DISCECTOMY FUSION  2019   APPENDECTOMY  1975   BACK SURGERY     BIOPSY  05/12/2020   Procedure: BIOPSY;  Surgeon: Milus Banister, MD;  Location: [facility];  Service: Endoscopy;;   CARDIAC CATHETERIZATION  2020   CHOLECYSTECTOMY N/A 07/21/2020   Procedure: LAPAROSCOPIC CHOLECYSTECTOMY WITH INTRAOPERATIVE CHOLANGIOGRAM;  Surgeon: [name], MD;  Location: [place];  Service: General;  Laterality: N/A;   COLONOSCOPY  08/12/2017   Hx TA (piecemeal)Jacobs-MAC-suprep (good)   ESOPHAGOGASTRODUODENOSCOPY (EGD) WITH PROPOFOL N/A 05/12/2020   Procedure: ESOPHAGOGASTRODUODENOSCOPY (EGD) WITH PROPOFOL;  Surgeon: Milus Banister, MD;  Location: [facility];  Service: Endoscopy;  Laterality: N/A;   EUS N/A 05/12/2020   Procedure: UPPER ENDOSCOPIC ULTRASOUND (EUS) RADIAL;  Surgeon: Milus Banister, MD;  Location: [facility];  Service: Endoscopy;  Laterality: N/A;   KNEE SURGERY Bilateral 1996   x 2 - arthroscopic   LEFT HEART CATH AND CORONARY ANGIOGRAPHY N/A 11/25/2018   Procedure: LEFT HEART CATH AND CORONARY ANGIOGRAPHY;  Surgeon: Troy Sine, MD;  Location: [place] [facility];  Service: Cardiovascular;  Laterality: N/A;   PELVIC LAPAROSCOPY  1989   W LYSIS OF  ADHESIONS/L SALPINGONEOSTOMY   TUBAL LIGATION     WISDOM TOOTH EXTRACTION      Current Medications: Outpatient Medications Prior to Visit  Medication Sig  Dispense Refill   ALPRAZolam (XANAX) 0.5 MG tablet Take 0.5 mg by mouth at bedtime as needed for anxiety or sleep.      amLODipine (NORVASC) 10 MG tablet TAKE 1 TABLET(10 MG) BY MOUTH DAILY 90 tablet 3   Ascorbic Acid (VITAMIN C) 1000 MG tablet Take 1,000 mg by mouth daily.     butalbital-acetaminophen-caffeine (FIORICET, ESGIC) 50-325-40 MG tablet Take 1 tablet by mouth every 6 (six) hours as needed for headache.   0   Cholecalciferol (VITAMIN D) 50 MCG (2000 UT) tablet Take 2,000 Units by mouth daily.      ezetimibe (ZETIA) 10 MG tablet TAKE 1 TABLET(10 MG) BY MOUTH DAILY 90 tablet 3   hydrALAZINE (APRESOLINE) 100 MG tablet Take 1 tablet (100 mg total) by mouth 3 (three) times daily. 270 tablet 1   HYDROcodone-acetaminophen (NORCO) 10-325 MG tablet Take 1 tablet by mouth 2 (two) times daily as needed for moderate pain.      Multiple Vitamin (MULTIVITAMIN WITH MINERALS) TABS tablet Take 1 tablet by mouth daily.     olmesartan (BENICAR) 40 MG tablet Take 1 tablet (40 mg total) by mouth daily. 90 tablet 3   valACYclovir (VALTREX) 500 MG tablet TAKE 1 TABLET BY MOUTH TWICE DAILY FOR 3 TO 5 DAYS THEN TAKE DAILY AS NEEDED (Patient taking differently: Take 500 mg by mouth daily as needed (shingles).) 30 tablet 0   zolpidem (AMBIEN) 10 MG tablet Take 10 mg by mouth at bedtime as needed for sleep.      nitrofurantoin, macrocrystal-monohydrate, (MACROBID) 100 MG capsule Take 1 capsule by mouth in the morning and at bedtime. (Patient not taking: Reported on 10/10/2021)     No facility-administered medications prior to visit.     Allergies:   Cyclobenzaprine, Tizanidine, and Latex   Social History   Socioeconomic History   Marital status: Married    Spouse name: don   Number of children: 2   Years of education: College   Highest education level: Not on file  Occupational History   Occupation: Realtor  Tobacco Use   Smoking status: Former    Packs/day: 0.15    Years: 20.00    Pack years: 3.00     Types: Cigarettes    Quit date: 11/12/1998    Years since quitting: 22.9   Smokeless tobacco: Never  Vaping Use   Vaping Use: Never used  Substance and Sexual Activity   Alcohol use: Not Currently   Drug use: No   Sexual activity: Not Currently    Birth control/protection: Post-menopausal, Surgical    Comment: HYSTERECTOMY  Other Topics Concern   Not on file  Social History Narrative   Lives at home with her husband Don   Right handed   Caffeine: unsweet tea, 2 glasses daily   Social Determinants of Health   Financial Resource Strain: Not on file  Food Insecurity: Not on file  Transportation Needs: Not on file  Physical Activity: Not on file  Stress: Not on file  Social Connections: Not on file    Social history is notable in that she has had several marriages and currently has been married for 16 years.  She has 2 children, 2 grandchildren.  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  There is a higher tobacco history, she quit in 2002.  She does yoga intermittently.  Family History:  The patient's family history includes Aneurysm in her father; COPD in her brother; Heart disease in her paternal grandfather; Other in her sister and sister; Skin cancer in her brother and brother; Uterine cancer (age of onset: 49) in her mother.   Her mother died at age 10.  Her father died at age 74 and had blood clots.  A brother died at age 45 and was exposed to agent orange.  She has a brother age 71 with cancer.  She has 5 sisters.   ROS General: Negative; No fevers, chills, or night sweats;  HEENT: Negative; No changes in vision or hearing, sinus congestion, difficulty swallowing Pulmonary: Negative; No cough, wheezing, shortness of breath, hemoptysis Cardiovascular: See HPI GI: Recent pancreatitis and cholecystectomy GU: History of UTIs Musculoskeletal: Chronic low back pain, osteo-arthritis, fibromyalgia Hematologic/Oncology: Negative; no easy bruising, bleeding Endocrine:  Negative; no heat/cold intolerance; no diabetes Neuro: Negative; no changes in balance, headaches Skin: Negative; No rashes or skin lesions Psychiatric: Admits to increased stress Sleep: Negative; No snoring, daytime sleepiness, hypersomnolence, bruxism, restless legs, hypnogognic hallucinations, no cataplexy Other comprehensive 14 point system review is negative.   PHYSICAL EXAM:   VS:  BP (!) 158/80   Pulse 65   Ht _0  (1.6 m)   Wt 165 lb 12.8 oz (75.2 kg)   LMP  (LMP Unknown)   SpO2 98%   BMI 29.37 kg/m     Repeat blood pressure evaluation is 160/76.  Wt Readings from Last 3 Encounters:  10/10/21 165 lb 12.8 oz (75.2 kg)  10/03/21 160 lb 3.2 oz (72.7 kg)  08/22/21 161 lb 9.6 oz (73.3 kg)    General: Alert, oriented, no distress.  Skin: normal turgor, no rashes, warm and dry HEENT: Normocephalic, atraumatic. Pupils equal round and reactive to light; sclera anicteric; extraocular muscles intact; Nose without nasal septal hypertrophy Mouth/Parynx benign; Mallinpatti scale 3 Neck: No JVD, no carotid bruits; normal carotid upstroke Lungs: clear to ausculatation and percussion; no wheezing or rales Chest wall: without tenderness to palpitation Heart: PMI not displaced, RRR, s1 s2 normal, 1/6 systolic murmur, no diastolic murmur, no rubs, gallops, thrills, or heaves Abdomen: soft, nontender; no hepatosplenomehaly, BS+; abdominal aorta nontender and not dilated by palpation. Back: no CVA tenderness Pulses 2+ Musculoskeletal: full range of motion, normal strength, no joint deformities Extremities: no clubbing cyanosis or edema, Homan's sign negative  Neurologic: grossly nonfocal; Cranial nerves grossly wnl Psychologic: Normal mood and affect  Studies/Labs Reviewed:   October 10, 2021 ECG (independently read by me):  NSR at 65, nonspecific T abnormality  Apr 06, 2021 ECG (independently read by me): Sinus bradycardia 54 bpm, possible left atrial enlargement.  Nonspecific ST  changes.  Normal intervals.  No ectopy.  February 17, 2021 ECG (independently read by me): Sinus bradycardia at 56; Mild ST changes  February 2022 ECG (independently read by me): Sinus bradycardia 53 bpm.  No ectopy.  Normal intervals     July 20, 2021ECG (independently read by me): Normal sinus rhythm at 66 bpm.  Left atrial enlargement.  Nonspecific ST changes.  QTc interval 461 Robin.  March 2020 ECG (independently read by me): Sinus bradycardia 59 bpm.  Normal intervals.  No ectopy.  November 20, 2018 EKG:  EKG is ordered today. Sinus Bradycardia at 58;Q wave III, no STT changes  October 06, 2018 ECG (independently read by me): Normal sinus rhythm at 66 bpm.  Nondiagnostic Q waves in lead III and  aVF.  Normal intervals.  No ectopy.  No ST segment changes.  Recent Labs: BMP Latest Ref Rng & Units 08/28/2021 06/15/2021 03/14/2021  Glucose 70 - 99 mg/dL 102(H) 88 84  BUN 8 - 27 mg/dL _0 Creatinine 0.57 - 1.00 mg/dL 1.22(H) 1.66(H) 1.34(H)  BUN/Creat Ratio 12 - 28 13 10(L) 12  Sodium 134 - 144 mmol/L 136 137 136  Potassium 3.5 - 5.2 mmol/L 3.7 5.0 4.0  Chloride 96 - 106 mmol/L 99 101 100  CO2 20 - 29 mmol/L _1 Calcium 8.7 - 10.3 mg/dL 9.3 9.8 9.3     Hepatic Function Latest Ref Rng & Units 06/15/2021 01/20/2021 10/13/2020  Total Protein 6.0 - 8.5 g/dL 7.0 6.9 7.5  Albumin 3.8 - 4.8 g/dL 4.4 4.1 4.4  AST 0 - 40 IU/L _2 ALT 0 - 32 IU/L _3 Alk Phosphatase 44 - 121 IU/L 103 94 97  Total Bilirubin 0.0 - 1.2 mg/dL <0.2 0.2 0.3  Bilirubin, Direct 0.0 - 0.2 mg/dL - - -    CBC Latest Ref Rng & Units 08/28/2021 10/13/2020 07/13/2020  WBC 3.4 - 10.8 x10E3/uL 9.7 12.7(H) 11.1(H)  Hemoglobin 11.1 - 15.9 g/dL 11.2 12.6 12.0  Hematocrit 34.0 - 46.6 % 32.8(L) 36.1 37.5  Platelets 150 - 450 x10E3/uL 304 388 333   Lab Results  Component Value Date   MCV 95 08/28/2021   MCV 97 10/13/2020   MCV 103.9 (H) 07/13/2020   Lab Results  Component Value Date   TSH 1.070 08/28/2021    No results found for: HGBA1C   BNP No results found for: BNP  ProBNP No results found for: PROBNP   Lipid Panel     Component Value Date/Time   CHOL 208 (H) 01/20/2021 1230   TRIG 122 01/20/2021 1230   HDL 68 01/20/2021 1230   CHOLHDL 3.1 01/20/2021 1230   LDLCALC 119 (H) 01/20/2021 1230     RADIOLOGY: No results found.   Additional studies/ records that were reviewed today include:  I reviewed the records from the emergency room.  I reviewed the records from [place] [facility] at [facility] by [employer].  Records from [facility] were reviewed  ------------------------------------------------------------------- 10/14/2018 ECHO Study Conclusions   - Left ventricle: Average global longitudinal LV strain is normal   at -18.7% The cavity size was normal. Systolic function was   normal. The estimated ejection fraction was in the range of 60%   to 65%. Wall motion was normal; there were no regional wall   motion abnormalities. The study is not technically sufficient to   allow evaluation of LV diastolic function. - Aortic valve: Trileaflet; mildly thickened, mildly calcified   leaflets. - Mitral valve: There was mild regurgitation. - Atrial septum: There was increased thickness of the septum,   consistent with lipomatous hypertrophy.    RENAL DUPLEX: 02/22/2021 Summary:  Renal:     Right: No evidence of right renal artery stenosis. RRV flow present.         Normal size right kidney. Abnormal right Resistive Index.         Normal cortical thickness of right kidney.  Left:  No evidence of left renal artery stenosis. LRV flow present.         Abnormal size for the left kidney; appears atrophic. Normal         left Resistive Index. Normal cortical thickness of the left  kidney.  Mesenteric:  Normal Celiac artery and Superior Mesenteric artery findings.       ASSESSMENT:    1. Primary hypertension   2. Coronary artery disease involving  native coronary artery of native heart without angina pectoris   3. Hyperlipidemia with target LDL less than 70   4. Fatigue, unspecified type   5. Medication management   6. Mixed hyperlipidemia     PLAN:  Robin. Mozella Howk is a 70 year old female who had experienced intermittent episodes of atypical chest discomfort initially was sent to me due to concerns for possibly have had a prior heart attack for which she was unaware.  Her chest pain was nonexertional.  She had small non-diagnostic inferior Q waves. She has had issues with stage II hypertension and her blood pressure was significantly elevated at her emergency room evaluation.  When I initially saw her I recommended the addition of amlodipine to her medical regimen. An echo Doppler study demonstrated normal systolic without wall motion abnormalities and with normal global longitudinal strain.  There was evidence for mild aortic sclerosis without stenosis, mild MR, and probable lipomatous hypertrophy of her atrial septum.  She  continued to experience sharp stabbing-like chest pain and previously on physical examination today she had definite tenderness to her costochondral region pectoral region suggesting her chest pain is most likely of musculoskeletal etiology.  Her CT angiogram raised the possibility of at least moderate multivessel coronary atherosclerosis and her distal LAD lesion was felt to be FFR significant.  Definitive cardiac catheterization did not  demonstrate high-grade obstructive disease with no narrowings greater than 25% in the LAD circumflex marginal and dominant RCA.  She has subsequently been found to have multilevel cervical degenerative spondylolisthesis as well as cervical disc disease as noted above.  She underwent successful cervical disc surgery by Dr. Vertell Limber with significant benefit in improvement in her severe bilateral arm discomfort.  She subsequently had an episode of pancreatitis with dilated bile duct and underwent  successful cholecystectomy on July 26, 2020.  Throughout this year, she has had continued difficulty with blood pressure lability and required additional medication titration.  A renal duplex study did not reveal evidence for an renal artery stenosis.  Apparently, she is under significant stress and ultimately stopped taking several medications including spironolactone as well as metoprolol and most recently is on amlodipine 10 mg, hydralazine 100 mg 3 times a day and olmesartan 40 mg.  When seen by Karren Cobble, RPH 1 week previously she had significant blood pressure elevation at 205/82.  At that time hydralazine was further titrated.  Today she states he really does not want to take any additional medications.  After some discussion, she ultimately continued on low-dose doxazosin.  I prescribed a 2 mg pill and recommended initiating therapy with 1 mg at bedtime and depending upon blood pressure this can be titrated to 2 mg.  She believes she had developed urinary tract infections as result of olmesartan and some of her other medications contributed to brain fog and confusion.  Laboratory in October 2022 showed creatinine 1.22 which was improved from 1.66 from August 2022.  She is no longer on rosuvastatin and continues to take Zetia 10 mg for elevated lipids.  LDL cholesterol in March 2022 was 119.  I have recommended she follow-up with Karren Cobble, RPH in 6 to 8 weeks and see me thereafter for reevaluation.    Medication Adjustments/Labs and Tests Ordered: Current medicines are reviewed at length with the patient today.  Concerns regarding medicines are outlined above.  Medication changes, Labs and Tests ordered today are listed in the Patient Instructions below. Patient Instructions  Medication Instructions:  START: Doxazosin 1 mg (1/2 tablet) daily   *If you need a refill on your cardiac medications before your next appointment, please call your pharmacy*   Lab Work: None ordered     Testing/Procedures: None ordered   Follow-Up: At [facility], you and your health needs are our priority.  As part of our continuing mission to provide you with exceptional heart care, we have created designated Provider Care Teams.  These Care Teams include your primary Cardiologist (physician) and Advanced Practice Providers (APPs -  Physician Assistants and Nurse Practitioners) who all work together to provide you with the care you need, when you need it.  We recommend signing up for the patient portal called "MyChart".  Sign up information is provided on this After Visit Summary.  MyChart is used to connect with patients for Virtual Visits (Telemedicine).  Patients are able to view lab/test results, encounter notes, upcoming appointments, etc.  Non-urgent messages can be sent to your provider as well.   To learn more about what you can do with MyChart, go to [email or web address].    Your next appointment:   6 -8 week(s)  The format for your next appointment:   In Person  Provider: Pharm D, then [facility] in 4 months}      Signed, Shelva Majestic, MD  10/17/2021 5:42 PM    [facility] [address], [place], [place], [place]  [address] Phone: [phone]

## 2021-10-11 DIAGNOSIS — I10 Essential (primary) hypertension: Secondary | ICD-10-CM | POA: Diagnosis not present

## 2021-10-11 DIAGNOSIS — M5416 Radiculopathy, lumbar region: Secondary | ICD-10-CM | POA: Diagnosis not present

## 2021-10-11 DIAGNOSIS — Z6828 Body mass index (BMI) 28.0-28.9, adult: Secondary | ICD-10-CM | POA: Diagnosis not present

## 2021-10-12 ENCOUNTER — Other Ambulatory Visit (HOSPITAL_COMMUNITY): Payer: Self-pay | Admitting: Neurological Surgery

## 2021-10-12 ENCOUNTER — Other Ambulatory Visit: Payer: Self-pay | Admitting: Neurological Surgery

## 2021-10-12 DIAGNOSIS — M5416 Radiculopathy, lumbar region: Secondary | ICD-10-CM

## 2021-10-17 ENCOUNTER — Encounter: Payer: Self-pay | Admitting: Cardiovascular Disease

## 2021-10-25 ENCOUNTER — Ambulatory Visit (HOSPITAL_COMMUNITY)
Admission: RE | Admit: 2021-10-25 | Discharge: 2021-10-25 | Disposition: A | Discharge status: Home / Self Care | Payer: PPO | Source: Ambulatory Visit | Attending: Neurological Surgery | Admitting: Neurological Surgery

## 2021-10-25 ENCOUNTER — Other Ambulatory Visit: Payer: Self-pay

## 2021-10-25 DIAGNOSIS — M48061 Spinal stenosis, lumbar region without neurogenic claudication: Secondary | ICD-10-CM | POA: Insufficient documentation

## 2021-10-25 DIAGNOSIS — M4316 Spondylolisthesis, lumbar region: Secondary | ICD-10-CM | POA: Insufficient documentation

## 2021-10-25 DIAGNOSIS — M5416 Radiculopathy, lumbar region: Secondary | ICD-10-CM

## 2021-10-25 DIAGNOSIS — M4317 Spondylolisthesis, lumbosacral region: Secondary | ICD-10-CM | POA: Diagnosis not present

## 2021-10-25 IMAGING — CT CT L SPINE W/ CM
3 of 11 series · 12 of 33 positions shown, 14 images · non-contrast
Comparison: [DATE] lumbar MRI

CLINICAL DATA: Lumbar radiculopathy. Bilateral lower extremity
numbness and leg giving out.

EXAM:
LUMBAR MYELOGRAM
FLUOROSCOPY TIME:  Reference procedure note
PROCEDURE:
Lumbar puncture and intrathecal contrast administration were
performed by Dr. LEOCADIO who will separately report for the portion
of the procedure. I personally supervised acquisition of the
myelogram images.
TECHNIQUE: Contiguous axial images were obtained through the Lumbar spine after
the intrathecal infusion of infusion. Coronal and sagittal
reconstructions were obtained of the axial image sets.

[Series 5: l spine soft · axial · 0.31mm/px · z∈[+983,+1165]mm · 6 of 129 slices shown, 8 images]
[im 19/129  soft-tissue]
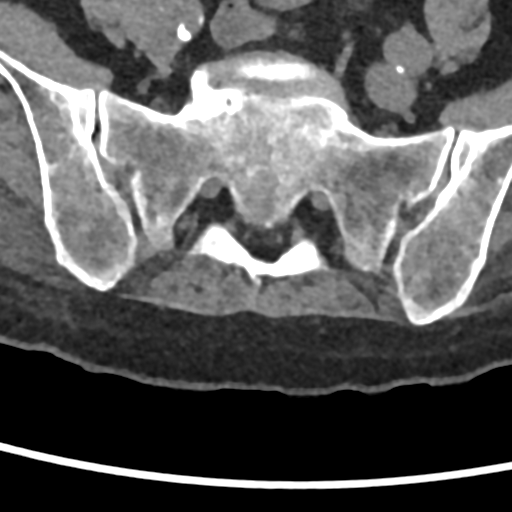
[im 19/129  bone]
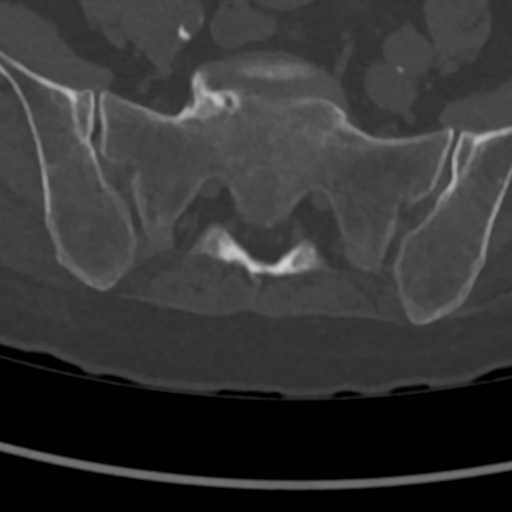
[im 37/129  bone]
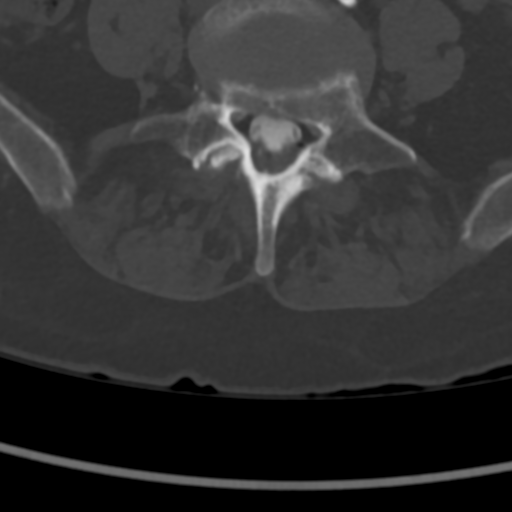
[im 55/129  bone]
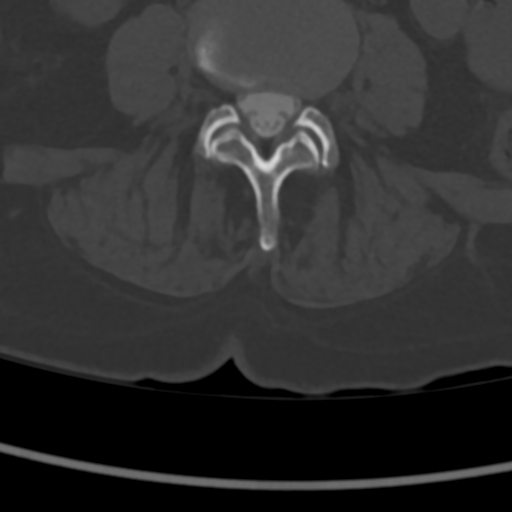
[im 74/129  bone]
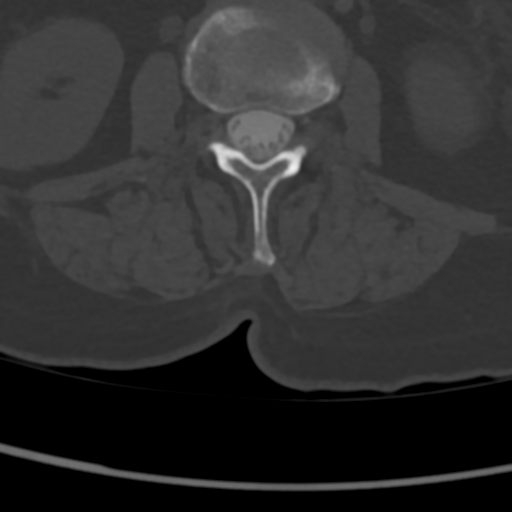
[im 92/129  soft-tissue]
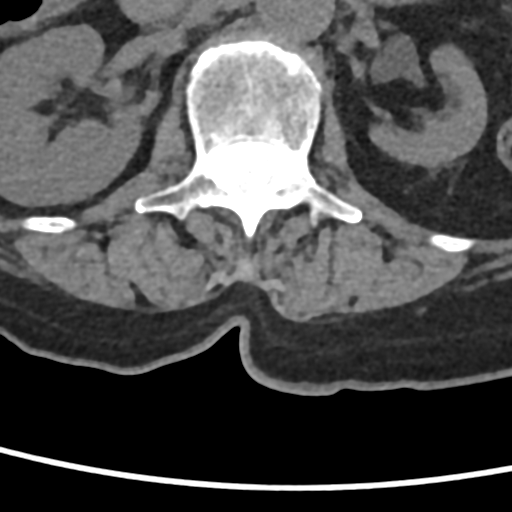
[im 92/129  bone]
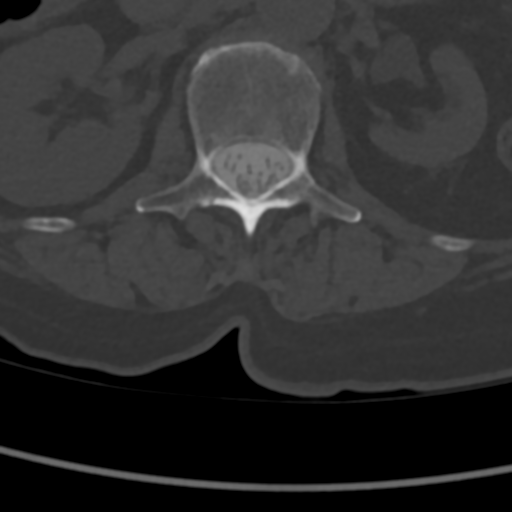
[im 110/129  bone]
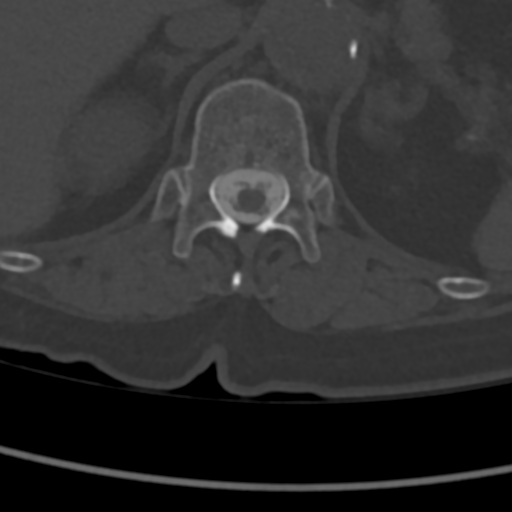

[Series 7: sag bone · sagittal · 0.38mm/px · 5 of 74 slices shown]
[im 13/74  bone]
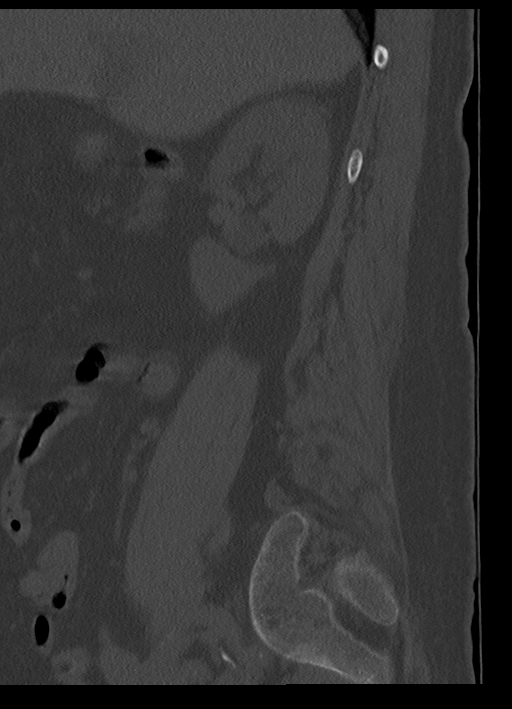
[im 25/74  bone]
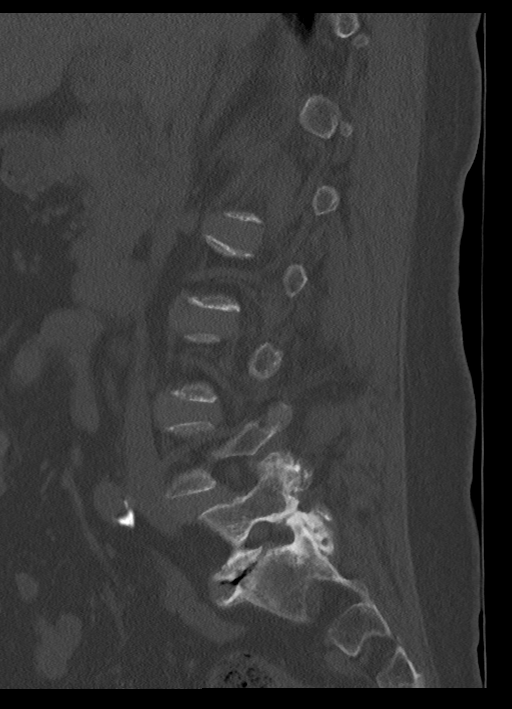
[im 37/74  bone]
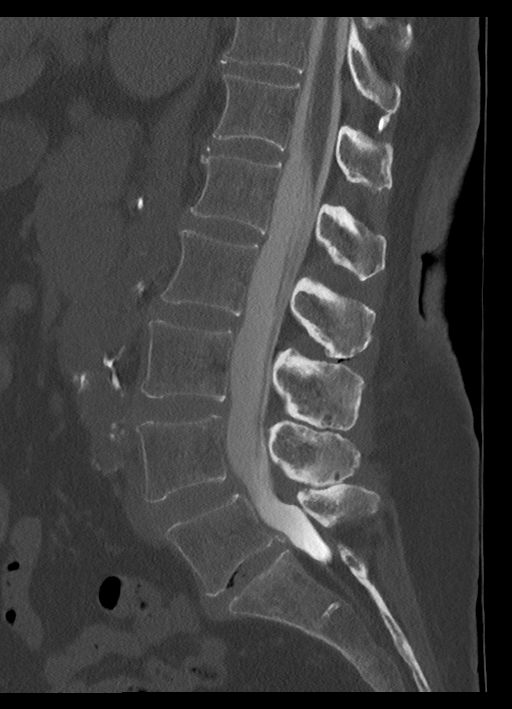
[im 49/74  bone]
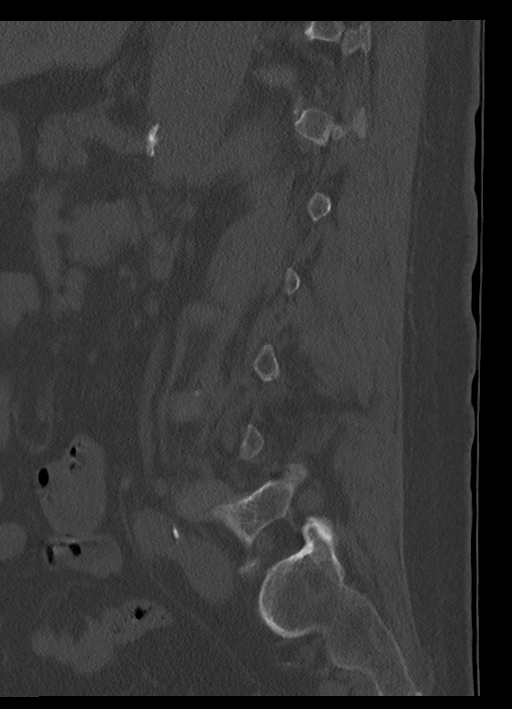
[im 61/74  bone]
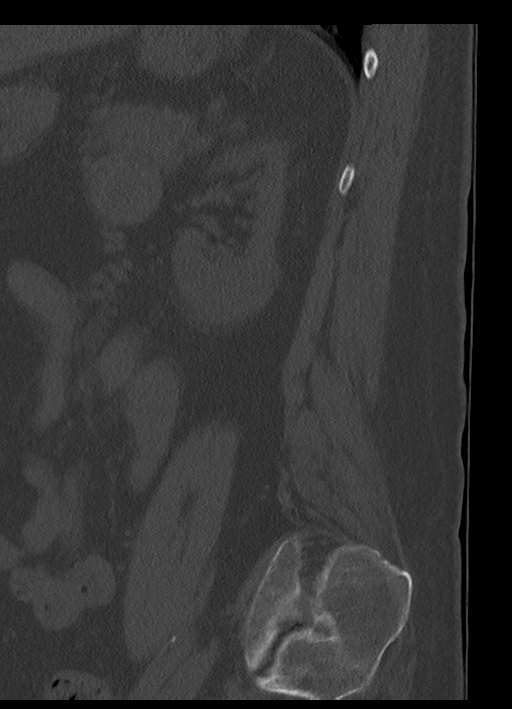

[Series 9: cor bone · coronal · 0.42mm/px · 1 of 92 slices shown]
[im 46/92  bone]
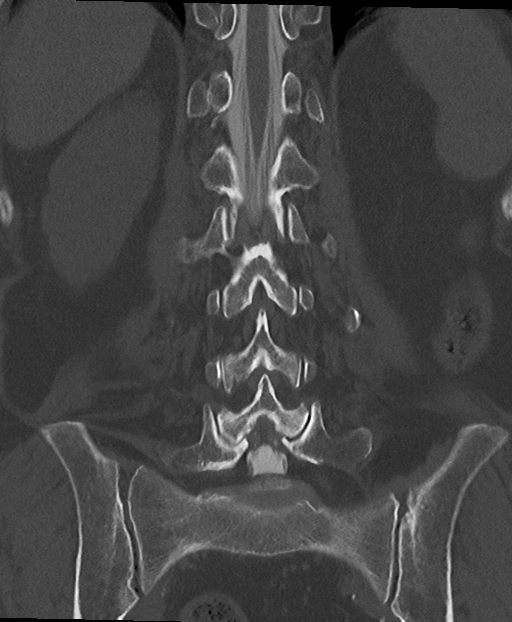

[12 of 33 positions shown; findings below may reference images not displayed]

FINDINGS: LUMBAR MYELOGRAM FINDINGS:

Free flow of intrathecal contrast. L4-5 and L5-S1 degenerative,
facet mediated anterolisthesis with greater motion at L5-S1 when
flexing and extending.

CT LUMBAR MYELOGRAM FINDINGS:

Segmentation: 5 lumbar type vertebrae

Vertebrae: No evidence of fracture or bone lesion.

Conus: Tip terminates at L1.  Unremarkable cauda equina.

Extra-spinal: Atrophy of the upper pole left kidney from remote
insult, with smooth cortex suggesting vascular cause. This finding
is stable from a [3G] abdominal CT.

Disc levels:

T12-L1 to L3-4: Mild disc height loss and annulus bulging. Inter
spinous gas at L2-3 and inter spinous degeneration at L3-4.

L4-L5: Facet osteoarthritis with spurring and anterolisthesis.
Ligamentum flavum thickening and disc bulging. Left foraminal
protrusion. The canal and foramina are patent with mild foraminal
narrowing on the left

L5-S1:Facet osteoarthritis with anterolisthesis. Disc space
narrowing and gas containing fissure with disc bulge and posterior
disc ossification. No neural compression.
IMPRESSION: 1. Spinal degeneration primarily affecting the L4-5 and L5-S1 facets
with anterolisthesis.
2. No neural compression. Up to mild foraminal stenosis on the left
at L4-5.

## 2021-10-25 IMAGING — RF DG MYELOGRAM LUMBAR
10 series · 10 of 10 positions shown · non-contrast
Comparison: [DATE] lumbar MRI

CLINICAL DATA: Lumbar radiculopathy. Bilateral lower extremity
numbness and leg giving out.

EXAM:
LUMBAR MYELOGRAM
FLUOROSCOPY TIME:  Reference procedure note
PROCEDURE:
Lumbar puncture and intrathecal contrast administration were
performed by Dr. LEOCADIO who will separately report for the portion
of the procedure. I personally supervised acquisition of the
myelogram images.
TECHNIQUE: Contiguous axial images were obtained through the Lumbar spine after
the intrathecal infusion of infusion. Coronal and sagittal
reconstructions were obtained of the axial image sets.

[Series 1: cp_standard · 0.28mm/px · 1 of 1 slices shown (1 of 2)]
[im 1/1]
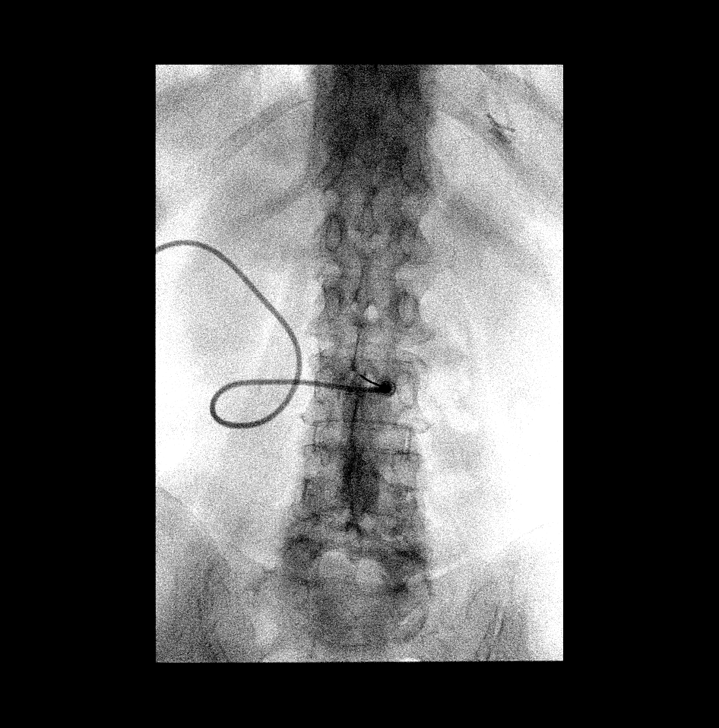

[Series 2: cp_standard · 0.26mm/px · 1 of 1 slices shown (2 of 2)]
[im 1/1]
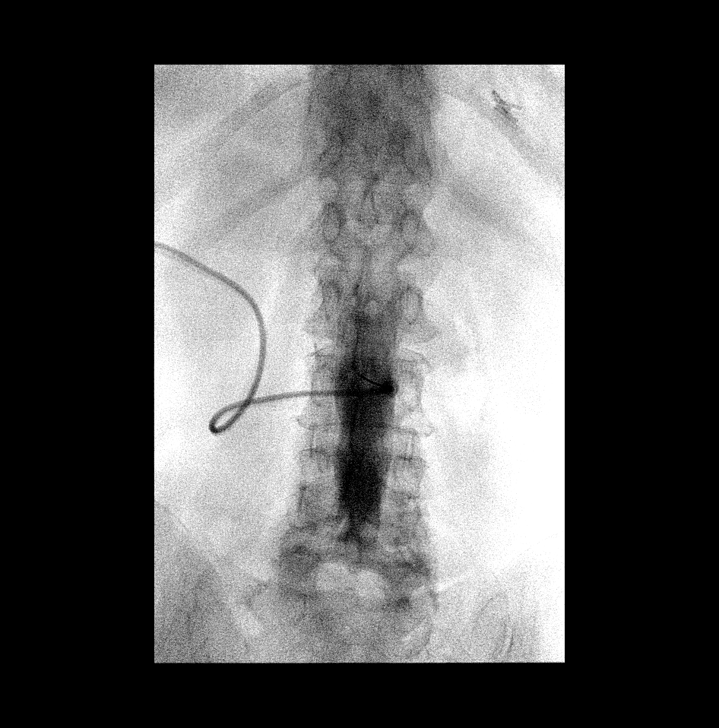

[Series 3: fluoro_myelogram_singleshot_bw · 0.17mm/px · 1 of 1 slices shown (1 of 8)]
[im 1/1]
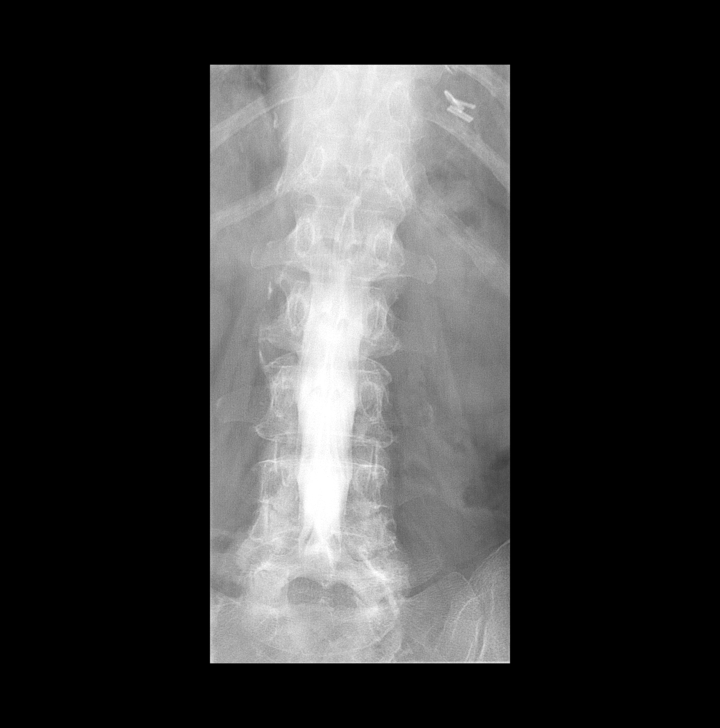

[Series 4: fluoro_myelogram_singleshot_bw · 0.17mm/px · 1 of 1 slices shown (2 of 8)]
[im 1/1]
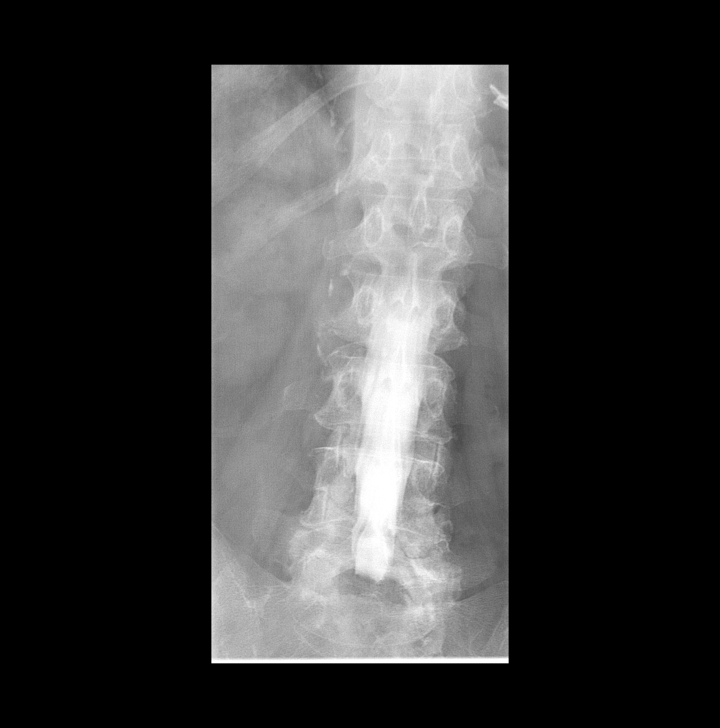

[Series 5: fluoro_myelogram_singleshot_bw · 0.19mm/px · 1 of 1 slices shown (3 of 8)]
[im 1/1]
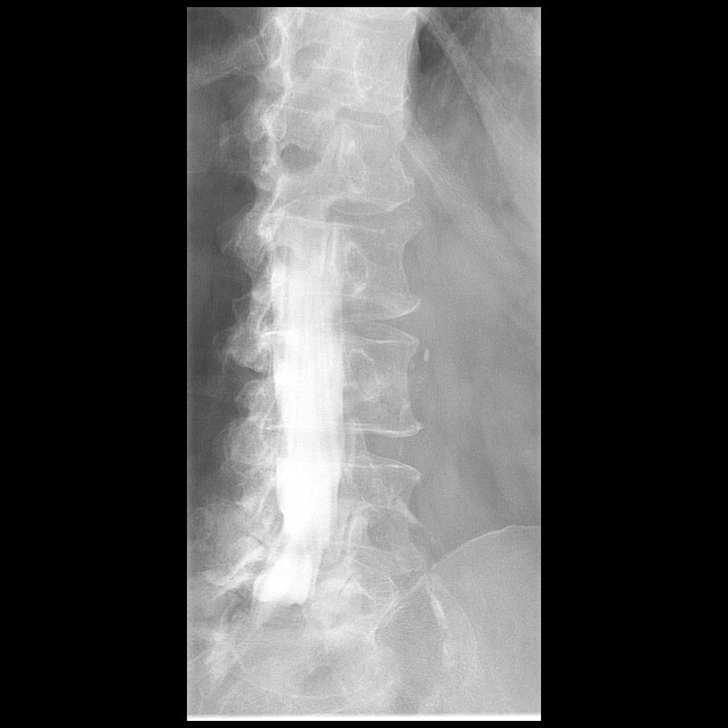

[Series 6: fluoro_myelogram_singleshot_bw · 0.17mm/px · 1 of 1 slices shown (4 of 8)]
[im 1/1]
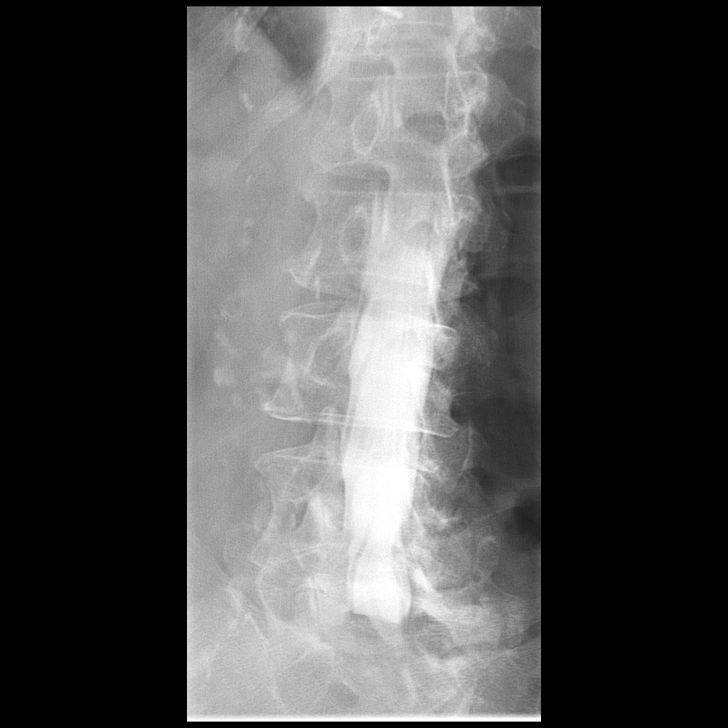

[Series 7: fluoro_myelogram_singleshot_bw · 0.17mm/px · 1 of 1 slices shown (5 of 8)]
[im 1/1]
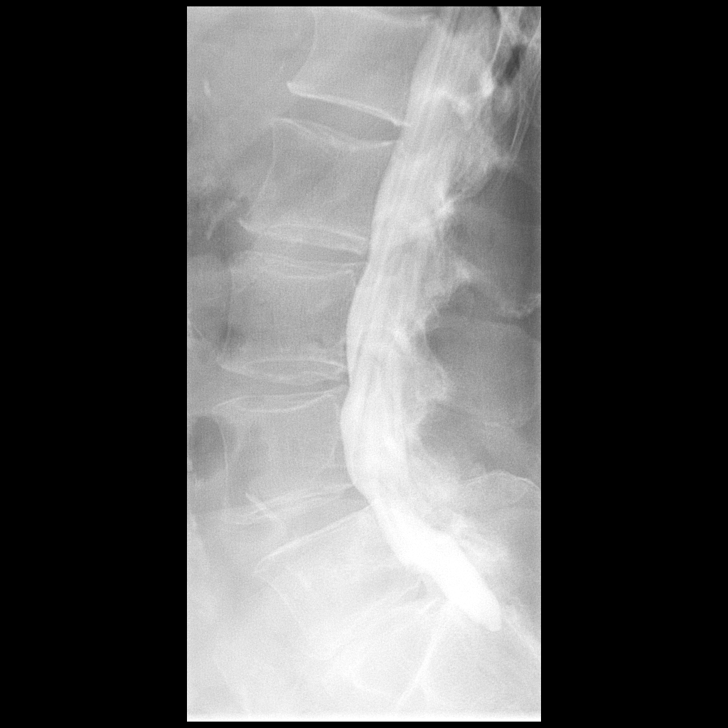

[Series 8: fluoro_myelogram_singleshot_bw · 0.17mm/px · 1 of 1 slices shown (6 of 8)]
[im 1/1]
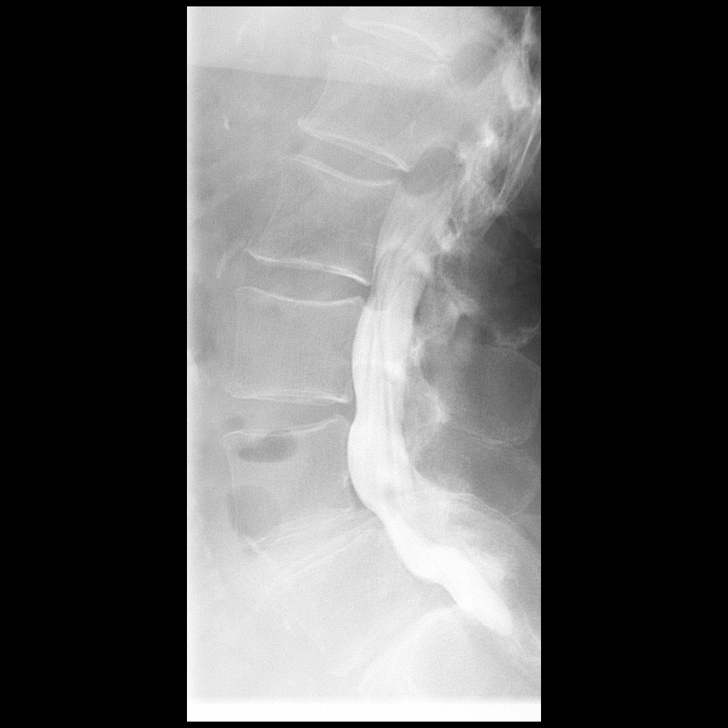

[Series 9: fluoro_myelogram_singleshot_bw · 0.17mm/px · 1 of 1 slices shown (7 of 8)]
[im 1/1]
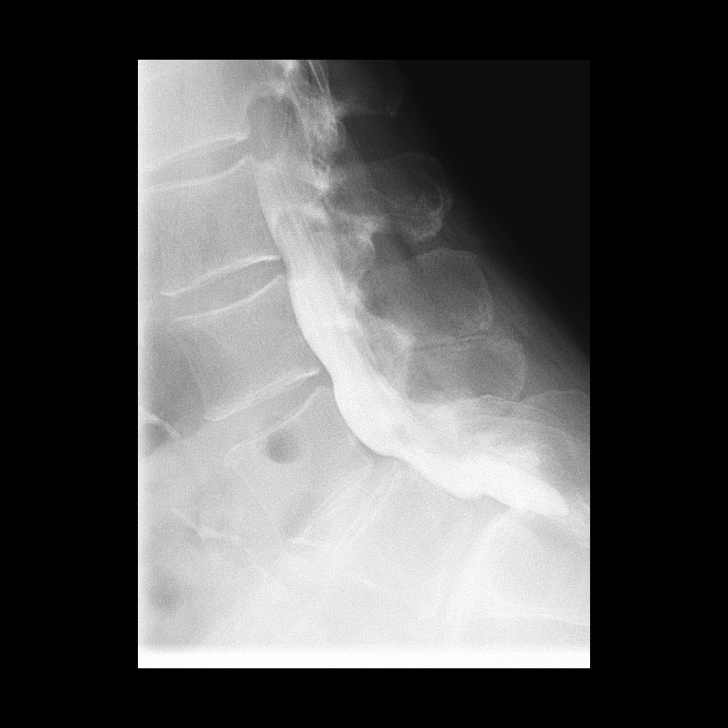

[Series 10: fluoro_myelogram_singleshot_bw · 0.17mm/px · 1 of 1 slices shown (8 of 8)]
[im 1/1]
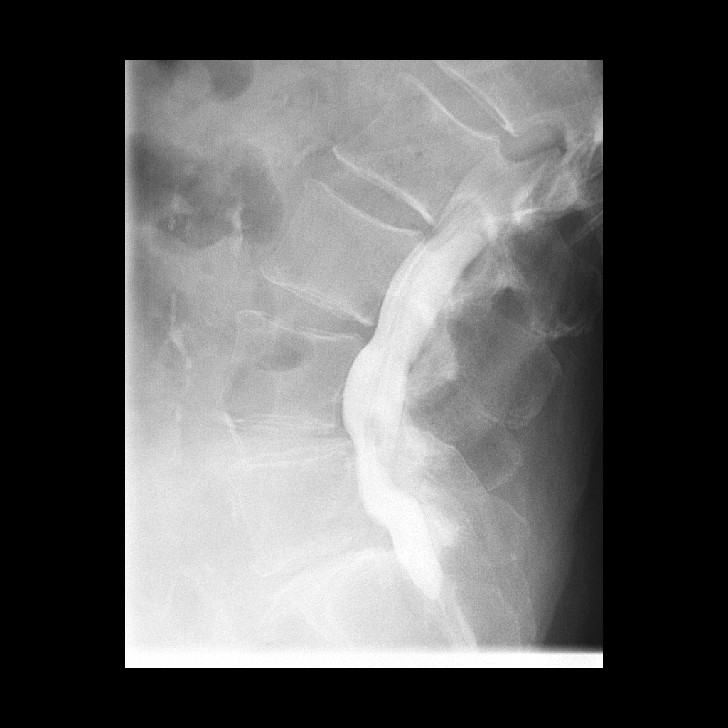

[10 of 10 positions shown; findings below may reference images not displayed]

FINDINGS: LUMBAR MYELOGRAM FINDINGS:

Free flow of intrathecal contrast. L4-5 and L5-S1 degenerative,
facet mediated anterolisthesis with greater motion at L5-S1 when
flexing and extending.

CT LUMBAR MYELOGRAM FINDINGS:

Segmentation: 5 lumbar type vertebrae

Vertebrae: No evidence of fracture or bone lesion.

Conus: Tip terminates at L1.  Unremarkable cauda equina.

Extra-spinal: Atrophy of the upper pole left kidney from remote
insult, with smooth cortex suggesting vascular cause. This finding
is stable from a [3G] abdominal CT.

Disc levels:

T12-L1 to L3-4: Mild disc height loss and annulus bulging. Inter
spinous gas at L2-3 and inter spinous degeneration at L3-4.

L4-L5: Facet osteoarthritis with spurring and anterolisthesis.
Ligamentum flavum thickening and disc bulging. Left foraminal
protrusion. The canal and foramina are patent with mild foraminal
narrowing on the left

L5-S1:Facet osteoarthritis with anterolisthesis. Disc space
narrowing and gas containing fissure with disc bulge and posterior
disc ossification. No neural compression.
IMPRESSION: 1. Spinal degeneration primarily affecting the L4-5 and L5-S1 facets
with anterolisthesis.
2. No neural compression. Up to mild foraminal stenosis on the left
at L4-5.

## 2021-10-25 MED ORDER — LIDOCAINE HCL (PF) 1 % IJ SOLN
5.0000 mL | Freq: Once | INTRAMUSCULAR | Status: AC
  Administered 2021-10-25: 09:00:00 5 mL via INTRADERMAL

## 2021-10-25 MED ORDER — ONDANSETRON HCL 4 MG/2ML IJ SOLN
4.0000 mg | Freq: Four times a day (QID) | INTRAMUSCULAR | Status: DC | PRN
  Filled 2021-10-25: qty 2

## 2021-10-25 MED ORDER — DIAZEPAM 5 MG PO TABS
ORAL_TABLET | ORAL | Status: AC
  Filled 2021-10-25: qty 2

## 2021-10-25 MED ORDER — HYDROCODONE-ACETAMINOPHEN 5-325 MG PO TABS
ORAL_TABLET | ORAL | Status: AC
  Filled 2021-10-25: qty 1

## 2021-10-25 MED ORDER — IOHEXOL 180 MG/ML  SOLN
20.0000 mL | Freq: Once | INTRAMUSCULAR | Status: AC | PRN
  Administered 2021-10-25: 09:00:00 10 mL via INTRATHECAL

## 2021-10-25 MED ORDER — DIAZEPAM 5 MG PO TABS
10.0000 mg | ORAL_TABLET | Freq: Once | ORAL | Status: AC
  Administered 2021-10-25: 08:00:00 10 mg via ORAL

## 2021-10-25 MED ORDER — HYDROCODONE-ACETAMINOPHEN 5-325 MG PO TABS
1.0000 | ORAL_TABLET | ORAL | Status: DC | PRN
  Administered 2021-10-25: 09:00:00 1 via ORAL

## 2021-10-25 MED ORDER — ONDANSETRON HCL 4 MG PO TABS
4.0000 mg | ORAL_TABLET | Freq: Once | ORAL | Status: AC | PRN
  Administered 2021-10-25: 10:00:00 4 mg via ORAL
  Filled 2021-10-25: qty 1

## 2021-10-25 NOTE — Procedures (Signed)
Robin Arellano is a 70 year old individual who has had significant lumbar spondylosis particularly with a left lumbar radiculopathy.  He has had left leg give way and he notes that it feels weak chronically.  An MRI from sometime ago demonstrated that she had some lateral recess stenosis that was not terribly severe and the spondylolisthesis at L4-5 and to a lesser extent at L5-S1 because the symptoms have been progressing and getting worse and are associated with bilateral lower extremity numbness I suggested a myelogram and postmyelogram scan to obtain some images while weightbearing and motion films to see if there is a significant change in the anatomy that would suggest some impingement syndrome a myelogram is now being performed.  Pre op Dx: Lumbar spondylolisthesis with radiculopathy Post op Dx: Same Procedure: Lumbar myelogram Surgeon: Xzaiver Vayda Puncture level: L3-4 Fluid color: Clear colorless Injection: Isovue 180, 10 mL Findings: Spondylitic stenosis in the lateral recesses at L4-5 and to a lesser extent at L5-S1 no overt canal compromise on flexion extension.  Further evaluation with CT scanning.

## 2021-10-26 ENCOUNTER — Encounter: Payer: Self-pay | Admitting: Cardiovascular Disease

## 2021-10-30 ENCOUNTER — Other Ambulatory Visit: Payer: Self-pay | Admitting: Cardiovascular Disease

## 2021-11-01 ENCOUNTER — Ambulatory Visit (HOSPITAL_COMMUNITY): Payer: PPO

## 2021-11-01 DIAGNOSIS — Z6828 Body mass index (BMI) 28.0-28.9, adult: Secondary | ICD-10-CM | POA: Diagnosis not present

## 2021-11-01 DIAGNOSIS — I10 Essential (primary) hypertension: Secondary | ICD-10-CM | POA: Diagnosis not present

## 2021-11-01 DIAGNOSIS — M5416 Radiculopathy, lumbar region: Secondary | ICD-10-CM | POA: Diagnosis not present

## 2021-11-12 HISTORY — PX: BACK SURGERY: SHX140

## 2021-11-29 ENCOUNTER — Other Ambulatory Visit: Payer: Self-pay | Admitting: Cardiovascular Disease

## 2022-01-04 ENCOUNTER — Other Ambulatory Visit: Payer: Self-pay | Admitting: Internal Medicine

## 2022-01-04 DIAGNOSIS — Z1231 Encounter for screening mammogram for malignant neoplasm of breast: Secondary | ICD-10-CM

## 2022-01-10 ENCOUNTER — Ambulatory Visit: Payer: PPO

## 2022-01-18 ENCOUNTER — Ambulatory Visit: Payer: PPO | Admitting: Podiatry

## 2022-01-22 ENCOUNTER — Other Ambulatory Visit: Payer: Self-pay

## 2022-01-22 ENCOUNTER — Ambulatory Visit (INDEPENDENT_AMBULATORY_CARE_PROVIDER_SITE_OTHER): Payer: PPO

## 2022-01-22 ENCOUNTER — Encounter: Payer: Self-pay | Admitting: Podiatry

## 2022-01-22 ENCOUNTER — Ambulatory Visit: Payer: PPO | Admitting: Podiatry

## 2022-01-22 DIAGNOSIS — M779 Enthesopathy, unspecified: Secondary | ICD-10-CM | POA: Diagnosis not present

## 2022-01-22 DIAGNOSIS — M79671 Pain in right foot: Secondary | ICD-10-CM

## 2022-01-22 DIAGNOSIS — M7752 Other enthesopathy of left foot: Secondary | ICD-10-CM

## 2022-01-22 DIAGNOSIS — M79672 Pain in left foot: Secondary | ICD-10-CM | POA: Diagnosis not present

## 2022-01-22 MED ORDER — TRIAMCINOLONE ACETONIDE 10 MG/ML IJ SUSP
10.0000 mg | Freq: Once | INTRAMUSCULAR | Status: AC
  Administered 2022-01-22: 10 mg

## 2022-01-24 NOTE — Progress Notes (Signed)
Subjective:  ? ?Patient ID: Robin Arellano, female   DOB: 71 y.o.   MRN: [id number]  ? ?HPI ?Patient presents stating that she is having a lot of pain more in her left than her right foot and states that she does get nerve pains and that she also seems to get pain when she walks.  States its been going on for a long time worsened over the last approximate 6 months and patient does have fibromyalgia and other issues which could be contributory to the overall condition.  Patient does not smoke is not significantly active ? ? ?Review of Systems  ?All other systems reviewed and are negative. ? ? ?   ?Objective:  ?Physical Exam ?Vitals and nursing note reviewed.  ?Constitutional:   ?   Appearance: She is well-developed.  ?Pulmonary:  ?   Effort: Pulmonary effort is normal.  ?Musculoskeletal:     ?   General: Normal range of motion.  ?Skin: ?   General: Skin is warm.  ?Neurological:  ?   Mental Status: She is alert.  ?  ?Neurovascular status intact muscle strength adequate range of motion within normal limits.  Patient does have diminishment sharp dull vibratory mild weakness of the extensor flexors and I did note quite a bit of pain in the third metatarsal phalangeal joint left with fluid buildup.  Patient has overall symptoms of neuropathic-like symptomatology also present but difficult to quantitate and does have good digital perfusion well oriented x3 ? ?   ?Assessment:  ?Number problems which may be due to just generalized inflammation or other pathology or even related to nerve issues with what appears to be acute inflammation of the lesser MPJ third left ? ?   ?Plan:  ?H&P reviewed all conditions and at this point continued medications that she takes for the overall neuropathic fibromyalgia condition.  I am going to focus on acute inflammation I did forefoot block left I aspirated the MPJ getting out a small amount of clear fluid and I injected 1/4 cc dexamethasone Kenalog applied thick plantar pad to reduce  pressure on the joint rigid bottom shoes and reappoint to recheck ? ?X-rays indicate there is no signs of fracture there is no signs of aggressive arthritis associated with the pathology with pain also in the right foot that I could not find any significant bony changes with ?   ? ? ?

## 2022-01-25 ENCOUNTER — Ambulatory Visit: Payer: PPO

## 2022-01-30 ENCOUNTER — Other Ambulatory Visit: Payer: Self-pay | Admitting: Podiatry

## 2022-01-30 DIAGNOSIS — M779 Enthesopathy, unspecified: Secondary | ICD-10-CM

## 2022-02-22 ENCOUNTER — Ambulatory Visit: Payer: PPO | Admitting: Podiatry

## 2022-03-01 ENCOUNTER — Encounter: Payer: Self-pay | Admitting: Physical Medicine & Rehabilitation

## 2022-03-14 ENCOUNTER — Ambulatory Visit: Payer: PPO

## 2022-04-04 ENCOUNTER — Other Ambulatory Visit: Payer: Self-pay | Admitting: Neurological Surgery

## 2022-04-16 ENCOUNTER — Telehealth: Payer: Self-pay | Admitting: Cardiovascular Disease

## 2022-04-16 NOTE — Telephone Encounter (Signed)
Spoke with patient of Dr. Claiborne Billings regarding elevated BP. She was last seen 09/2021 - started on doxazosin but since stopped.   She is taking amlodipine '10mg'$  QD, hydralazine '100mg'$  QD, olmesartan '40mg'$  QD -- all at the same time. Hydralazine was Rx'ed as TID  She said all 3 of her BP meds can cause bruising.  She said she also wants off all her BP meds and cholesterol med Explained that BP will not be controlled without meds but she said she read up that it can be  She reports having vertigo for a few weeks also - seeing PCP this   SBP was 208 when she saw Dr. Ellene Route - she said Dr. Ellene Route told her he would contact our office She has surgery on 6/15  Advised she needs an appointment for clearance/elevated BP. Scheduled for 6/7 @ 430pm. Advised she bring home BP readings to visit.   Will send to MD to review

## 2022-04-16 NOTE — Telephone Encounter (Signed)
Pt c/o BP issue: STAT if pt c/o blurred vision, one-sided weakness or slurred speech  1. What are your last 5 BP readings?  180/79 - Today after taking medication  2. Are you having any other symptoms (ex. Dizziness, headache, blurred vision, passed out)? Dizziness  3. What is your BP issue? Pt states that BP has been running high off and on since February. Pt states that she is suppose to have back surgery this month and is concerned that BP medication is not working and surgery not taking place. Please advise

## 2022-04-17 NOTE — Pre-Procedure Instructions (Signed)
Surgical Instructions    Your procedure is scheduled on Thursday 04/26/22.   Report to [facility] Main Entrance "A" at 10:10 A.M., then check in with the Admitting office.  Call this number if you have problems the morning of surgery:  [phone]   If you have any questions prior to your surgery date call [phone]: Open Monday-Friday 8am-4pm    Remember:  Do not eat after midnight the night before your surgery  You may drink clear liquids until 09:10 A.M. the morning of your surgery.   Clear liquids allowed are: Water, Non-Citrus Juices (without pulp), Carbonated Beverages, Clear Tea, Black Coffee ONLY (NO MILK, CREAM OR POWDERED CREAMER of any kind), and Gatorade    Take these medicines the morning of surgery with A SIP OF WATER:   amLODipine (NORVASC)   ezetimibe (ZETIA)  hydrALAZINE (APRESOLINE)    Take these medicines if needed:   HYDROcodone-acetaminophen (NORCO)  valACYclovir (VALTREX)   As of today, STOP taking any Aspirin (unless otherwise instructed by your surgeon) Aleve, Naproxen, Ibuprofen, Motrin, Advil, Goody's, BC's, all herbal medications, fish oil, and all vitamins.           Do not wear jewelry or makeup Do not wear lotions, powders, perfumes/colognes, or deodorant. Do not shave 48 hours prior to surgery.  Men may shave face and neck. Do not bring valuables to the hospital. Do not wear nail polish, gel polish, artificial nails, or any other type of covering on natural nails (fingers and toes) If you have artificial nails or gel coating that need to be removed by a nail salon, please have this removed prior to surgery. Artificial nails or gel coating may interfere with anesthesia's ability to adequately monitor your vital signs.  [facility] is not responsible for any belongings or valuables. .   Do NOT Smoke (Tobacco/Vaping)  24 hours prior to your procedure  If you use a CPAP at night, you may bring your mask for your overnight stay.   Contacts,  glasses, hearing aids, dentures or partials may not be worn into surgery, please bring cases for these belongings   For patients admitted to the hospital, discharge time will be determined by your treatment team.   Patients discharged the day of surgery will not be allowed to drive home, and someone needs to stay with them for 24 hours.   SURGICAL WAITING ROOM VISITATION Patients having surgery or a procedure in a hospital may have two support people. Children under the age of 1 must have an adult with them who is not the patient. They may stay in the waiting area during the procedure and may switch out with other visitors. If the patient needs to stay at the hospital during part of their recovery, the visitor guidelines for inpatient rooms apply.  Please refer to the [facility] website for the visitor guidelines for Inpatients (after your surgery is over and you are in a regular room).       Special instructions:    Oral Hygiene is also important to reduce your risk of infection.  Remember - BRUSH YOUR TEETH THE MORNING OF SURGERY WITH YOUR REGULAR TOOTHPASTE   [place]- Preparing For Surgery  Before surgery, you can play an important role. Because skin is not sterile, your skin needs to be as free of germs as possible. You can reduce the number of germs on your skin by washing with CHG (chlorahexidine gluconate) Soap before surgery.  CHG is an antiseptic cleaner which kills germs  and bonds with the skin to continue killing germs even after washing.     Please do not use if you have an allergy to CHG or antibacterial soaps. If your skin becomes reddened/irritated stop using the CHG.  Do not shave (including legs and underarms) for at least 48 hours prior to first CHG shower. It is OK to shave your face.  Please follow these instructions carefully.     Shower the NIGHT BEFORE SURGERY and the MORNING OF SURGERY with CHG Soap.   If you chose to wash your hair, wash your hair  first as usual with your normal shampoo. After you shampoo, rinse your hair and body thoroughly to remove the shampoo.  Then [employer] and genitals (private parts) with your normal soap and rinse thoroughly to remove soap.  After that Use CHG Soap as you would any other liquid soap. You can apply CHG directly to the skin and wash gently with a scrungie or a clean washcloth.   Apply the CHG Soap to your body ONLY FROM THE NECK DOWN.  Do not use on open wounds or open sores. Avoid contact with your eyes, ears, mouth and genitals (private parts). Wash Face and genitals (private parts)  with your normal soap.   Wash thoroughly, paying special attention to the area where your surgery will be performed.  Thoroughly rinse your body with warm water from the neck down.  DO NOT shower/wash with your normal soap after using and rinsing off the CHG Soap.  Pat yourself dry with a CLEAN TOWEL.  Wear CLEAN PAJAMAS to bed the night before surgery  Place CLEAN SHEETS on your bed the night before your surgery  DO NOT SLEEP WITH PETS.   Day of Surgery:  Take a shower with CHG soap. Wear Clean/Comfortable clothing the morning of surgery Do not apply any deodorants/lotions.   Remember to brush your teeth WITH YOUR REGULAR TOOTHPASTE.    If you received a COVID test during your pre-op visit, it is requested that you wear a mask when out in public, stay away from anyone that may not be feeling well, and notify your surgeon if you develop symptoms. If you have been in contact with anyone that has tested positive in the last 10 days, please notify your surgeon.    Please read over the following fact sheets that you were given.

## 2022-04-18 ENCOUNTER — Ambulatory Visit (INDEPENDENT_AMBULATORY_CARE_PROVIDER_SITE_OTHER): Payer: PPO | Admitting: Cardiovascular Disease

## 2022-04-18 ENCOUNTER — Encounter (HOSPITAL_COMMUNITY): Payer: Self-pay

## 2022-04-18 ENCOUNTER — Encounter: Payer: Self-pay | Admitting: Cardiovascular Disease

## 2022-04-18 ENCOUNTER — Other Ambulatory Visit: Payer: Self-pay

## 2022-04-18 ENCOUNTER — Encounter (HOSPITAL_COMMUNITY)
Admission: RE | Admit: 2022-04-18 | Discharge: 2022-04-18 | Disposition: A | Discharge status: Home / Self Care | Payer: PPO | Source: Ambulatory Visit | Attending: Neurological Surgery | Admitting: Neurological Surgery

## 2022-04-18 VITALS — BP 160/80 | HR 66 | Ht 63.0 in | Wt 153.6 lb

## 2022-04-18 VITALS — BP 170/87 | HR 61 | Temp 97.9°F | Resp 17 | Ht 63.0 in | Wt 152.6 lb

## 2022-04-18 DIAGNOSIS — I2510 Atherosclerotic heart disease of native coronary artery without angina pectoris: Secondary | ICD-10-CM | POA: Diagnosis not present

## 2022-04-18 DIAGNOSIS — Z01810 Encounter for preprocedural cardiovascular examination: Secondary | ICD-10-CM

## 2022-04-18 DIAGNOSIS — F419 Anxiety disorder, unspecified: Secondary | ICD-10-CM | POA: Diagnosis not present

## 2022-04-18 DIAGNOSIS — Z9049 Acquired absence of other specified parts of digestive tract: Secondary | ICD-10-CM | POA: Insufficient documentation

## 2022-04-18 DIAGNOSIS — F32A Depression, unspecified: Secondary | ICD-10-CM | POA: Diagnosis not present

## 2022-04-18 DIAGNOSIS — F909 Attention-deficit hyperactivity disorder, unspecified type: Secondary | ICD-10-CM | POA: Diagnosis not present

## 2022-04-18 DIAGNOSIS — M5442 Lumbago with sciatica, left side: Secondary | ICD-10-CM

## 2022-04-18 DIAGNOSIS — E785 Hyperlipidemia, unspecified: Secondary | ICD-10-CM

## 2022-04-18 DIAGNOSIS — G8929 Other chronic pain: Secondary | ICD-10-CM

## 2022-04-18 DIAGNOSIS — E782 Mixed hyperlipidemia: Secondary | ICD-10-CM

## 2022-04-18 DIAGNOSIS — Z01818 Encounter for other preprocedural examination: Secondary | ICD-10-CM

## 2022-04-18 DIAGNOSIS — Z87891 Personal history of nicotine dependence: Secondary | ICD-10-CM | POA: Diagnosis not present

## 2022-04-18 DIAGNOSIS — I10 Essential (primary) hypertension: Secondary | ICD-10-CM | POA: Diagnosis not present

## 2022-04-18 DIAGNOSIS — Z01812 Encounter for preprocedural laboratory examination: Secondary | ICD-10-CM | POA: Diagnosis not present

## 2022-04-18 DIAGNOSIS — M5090 Cervical disc disorder, unspecified, unspecified cervical region: Secondary | ICD-10-CM

## 2022-04-18 DIAGNOSIS — M797 Fibromyalgia: Secondary | ICD-10-CM | POA: Diagnosis not present

## 2022-04-18 HISTORY — DX: Family history of other specified conditions: Z84.89

## 2022-04-18 LAB — TYPE AND SCREEN
ABO/RH(D): O POS
Antibody Screen: NEGATIVE

## 2022-04-18 LAB — BASIC METABOLIC PANEL
Anion gap: 10 (ref 5–15)
BUN: 19 mg/dL (ref 8–23)
CO2: 20 mmol/L — ABNORMAL LOW (ref 22–32)
Calcium: 9.4 mg/dL (ref 8.9–10.3)
Chloride: 104 mmol/L (ref 98–111)
Creatinine, Ser: 1.67 mg/dL — ABNORMAL HIGH (ref 0.44–1.00)
GFR, Estimated: 33 mL/min — ABNORMAL LOW (ref >60)
Glucose, Bld: 104 mg/dL — ABNORMAL HIGH (ref 70–99)
Potassium: 3.6 mmol/L (ref 3.5–5.1)
Sodium: 134 mmol/L — ABNORMAL LOW (ref 135–145)

## 2022-04-18 LAB — CBC
HCT: 33.9 % — ABNORMAL LOW (ref 36.0–46.0)
Hemoglobin: 10.9 g/dL — ABNORMAL LOW (ref 12.0–15.0)
MCH: 31.8 pg (ref 26.0–34.0)
MCHC: 32.2 g/dL (ref 30.0–36.0)
MCV: 98.8 fL (ref 80.0–100.0)
Platelets: 297 10*3/uL (ref 150–400)
RBC: 3.43 MIL/uL — ABNORMAL LOW (ref 3.87–5.11)
RDW: 13.2 % (ref 11.5–15.5)
WBC: 9.3 10*3/uL (ref 4.0–10.5)
nRBC: 0 % (ref 0.0–0.2)

## 2022-04-18 LAB — SURGICAL PCR SCREEN
MRSA, PCR: NEGATIVE
Staphylococcus aureus: POSITIVE — AB

## 2022-04-18 NOTE — Patient Instructions (Signed)
Medication Instructions:    Increase Hydralazine  twice a day   *If you need a refill on your cardiac medications before your next appointment, please call your pharmacy*   Lab Work: Not needed    Testing/Procedures:    Follow-Up: At [facility], you and your health needs are our priority.  As part of our continuing mission to provide you with exceptional heart care, we have created designated Provider Care Teams.  These Care Teams include your primary Cardiologist (physician) and Advanced Practice Providers (APPs -  Physician Assistants and Nurse Practitioners) who all work together to provide you with the care you need, when you need it.     Your next appointment:   4 month(s)  The format for your next appointment:   In Person  Provider:   Shelva Majestic, MD    Other Instructions

## 2022-04-18 NOTE — Progress Notes (Addendum)
PCP - Dr. Rojelio Brenner Cardiologist - Dr. Shelva Majestic  PPM/ICD - n/a Device Orders - n/a  Rep Notified - n/a  Chest x-ray - n/a EKG - 10/10/21 Stress Test - Per patient, had one over 5 years ago. ECHO - 10/14/2018 Cardiac Cath - 11/25/18  Sleep Study - denies CPAP - denies  No DM  Blood Thinner Instructions: n/a Aspirin Instructions: As of today, STOP taking any Aspirin (unless otherwise instructed by your surgeon) Aleve, Naproxen, Ibuprofen, Motrin, Advil, Goody's, BC's, all herbal medications, fish oil, and all vitamins.  ERAS Protcol - Yes. No drink ordered  COVID TEST- n/a   Anesthesia review: Yes. Extensive Cardiac History and abnormal creatinine  Patient denies shortness of breath, fever, cough and chest pain at PAT appointment   All instructions explained to the patient, with a verbal understanding of the material. Patient agrees to go over the instructions while at home for a better understanding. Patient also instructed to self quarantine after being tested for COVID-19. The opportunity to ask questions was provided.

## 2022-04-18 NOTE — Progress Notes (Signed)
Cardiology Office Note    Date:  05/03/2022   ID:  Robin Arellano, Robin Arellano [birth year], MRN [id number]  PCP:  Sandi Mariscal, MD  Cardiologist:  Shelva Majestic, MD   7 month F/U evaluation initally referred by Dr.Yun Nancy Fetter at [facility] at [place]  History of Present Illness:  Robin Arellano is a 71 y.o. female who presents for 6 month follow-up cardiology evaluation.  I had seen remotely Robin Arellano in early 1994 with atypical chest pain. She was recently evaluated on September 22, 2018 by Dr. Aletta Edouard at [facility] with complaints of intermittent chest pain has been occurring off and on over the past year.  Prior to that evaluation, she was evaluated by her PCP and was told that she may have had an old heart attack based on her ECG.  During her ER evaluation she was significantly hypertensive with a blood pressure 187/100.  A chest x-ray did not show any acute abnormalities.  She had experienced some transient left facial numbness for 5 days associated with some dizziness and a CT was done which revealed atrophy with small vessel chronic ischemic changes of deep cerebral white matter without acute intracranial abnormalities.  She has recently been evaluated at [facility] on [place] by Dr. Theressa Millard and she is felt to have prediabetes.  Lipid studies were increased with a total cholesterol of 319, triglycerides 209, LDL cholesterol 224, and she was told to initiate Crestor 10 mg which she has not yet started.  Her sedimentation rate was elevated at 64.  ANA was negative.  RF was normal.  Thyroid studies were normal.  Because of her recent symptomatology, she was referred for cardiology evaluation.  When I saw her in November 2019 she complained of some intermittent chest discomfort which occurs in the center of her chest and seems to radiate to her left shoulder. It is aggravated when she turns a certain way and may last for hours and then ultimately resolves on its own.  She  was told that this may be from "Iinside shingles."   She denied any exertional symptomatology.  Additional history is notable for chronic bilateral low back pain with bilateral sciatica.  She also  has fibromyalgia and osteoarthritis.  Has a history of anxiety and depression.  During my evaluation, I reviewed laboratory from her primary physician which suggested a high likelihood for familial hyperlipidemia with a total cholesterol at 319, LDL cholesterol at 224, triglycerides of 209, and HDL of 53.  With her recurrent chest pain which most likely had musculoskeletal features and her significant hyperlipidemia I recommended she undergo coronary CT a to evaluate potential coronary atherosclerosis and recommended an echo Doppler evaluation particularly with her hypertensive history.  The echo Doppler study was done on October 14, 2018 and showed a normal LV function and normal LV strain with an EF of 60 to 65%.  There was mild aortic valve sclerosis without stenosis, mild MR, and a mildly thickened atrial septum consistent with lipomatous hypertrophy.  She underwent coronary CTA on November 06, 2018 which showed moderate coronary plaque with moderate tubular atherosclerosis in the proximal RCA and mid RCA in the 50 to 69% range.  Left main coronary artery had mild plaque less than 25%.  Her LAD had mild proximal plaque in the 25 to 49% range, also had a moderate long segment of atherosclerotic plaque in the mid LAD just beyond the second diagonal vessel of 50 to 69%.  She was felt to  have possible severe stenosis in the distal LAD with 70 to 99% plaque.  She had a small caliber ramus intermediate vessel.  The circumflex had mild to moderate plaque of 25 to 49% proximally, and then 50 to 69% stenosis.  Subsequent FFR analysis not reveal any significant flow-limiting stenosis with the exception of the distal LAD where the FFR was 0.57.   When I saw her November 20, 2018 she denied any classic exertional chest pain.   Her chest pain has been recurring and is described as being sharp and knifelike.  Typically it is of short duration.  She also has recently started rosuvastatin 20 mg and has begun to notice some calf discomfort attributed to initiation of statin therapy.    With her abnormal coronary CT angiogram definitive cardiac catheterization was recommended.  She underwent this catheterization by me on November 25, 2018 which revealed mild multivessel nonobstructive CAD with narrowings no greater than 25% in the LAD, circumflex marginal, and dominant RCA.  Medical therapy was recommended with aggressive lipid-lowering therapy with target LDL less than 70.  It was felt that the patient's chest pain had atypical features and most likely was nonischemic.  After her catheterization she ultimately underwent neurologic evaluation and subsequent MRI of her cervical spine which showed mild multilevel degenerative spondylolisthesis in the cervical spine with associated widespread cervical facet arthropathy.  There was superimposed cervical disc and endplate degeneration maximal at C5 and 6 but no associated cervical spinal stenosis.  There was moderate neuroforaminal stenosis at the right C4 right C5 bilateral C6 and left C7 nerve levels.  He has been seeing Dr. Maryjean Ka for pain management and had undergone epidural injection.  She also sees Dr. Vertell Limber.  She denies any exertional chest pain.  She has continued to take rosuvastatin for hyperlipidemia.  She was evaluated by me in a telemedicine encounter on February 16, 2020.  Since her prior evaluation she had undergone successful cervical disc surgery by Dr. Vertell Limber.  Prior to surgery she was having severe pain bilaterally in her arms.  This has essentially resolved.  She is now able to be more active.  Prior to her surgery she could not walk well due to significant pain.  She also tells me that she does have issues with her lower back and in August will be undergoing an MRI and may  also be undergoing an injection.  If symptoms progress she may require future low back surgery.   At her telemedicine visit, she denied any chest pain.  Apparently she had only been taking  metoprolol tartrate 25 mg once a day instead of as prescribed twice a day.  She has noticed some blood pressure elevation.  She denies any swelling.  She denies any anginal symptoms.  During her recent injection her blood pressure did significantly increase.  During that evaluation I suggested changing to metoprolol succinate initially 25 mg but her dose may need to be further titrated to 50 mg.  At that time she was on rosuvastatin 5 mg with target LDL less than 70.  She was hospitalized for 6 days in May 2021 with a diagnosis of acute pancreatitis and common bile duct dilatation.  I last saw her in July 2021 at which time she remained stable from a cardiac standpoint and was scheduled to see  Dr. Barry Dienes at [facility] surgery for possible future cholecystectomy.  During her hospitalization, she was taken off rosuvastatin and also was taken off metoprolol succinate.  She denied chest  pain, PND orthopnea.  During that evaluation, her blood pressure was elevated and I recommended resumption of metoprolol tartrate 25 mg twice a day.  Also with discontinuance of rosuvastatin I recommended initiation of Zetia 10 mg daily.  She underwent successful cholecystectomy on July 26, 2020.  She tolerated this well.  She had been off Crestor since May.    When I saw  her on September 08, 2020 her blood pressure was significantly elevated at 190/78 and on repeat by me was 180/78.  She was on amlodipine 10 mg and lisinopril 5 mg and had only been taking metoprolol 12.5 mg twice a day.  At that time I recommended she discontinue lisinopril and started her on olmesartan 20 mg.  I recommended follow-up laboratory.   Laboratory in December showed a total cholesterol 262, triglycerides 197, LDL 160 and HDL 36.  Creatinine was 1.15.   TSH was normal.  She was subsequently seen by Joslyn Hy, [facility] on November 24, 2020. I saw her on December 19, 2020.  She was felt not eligible for PCSK9 inhibition due to her nonobstructive CAD where she was only found to have mild multivessel disease with up to 25% narrowings.  She was rechallenged with Crestor 5 mg 3 times per week.  She dramatically changed her diet and stopped eating red meat and has been having predominantly chicken and salads.  Her blood pressure has improved but remained elevated and at home is in the 160-170 range.  During that evaluation with her continued blood pressure elevation I recommended further titration of olmesartan to 40 mg daily and increased her metoprolol to 25 mg twice a day.  She was rechallenged with low-dose rosuvastatin and was tolerating this fairly well.  I saw her on February 17, 2021.  At that time she denied any chest pain or shortness of breath.  She had issues with low back discomfort and increased stress which was associated with blood pressure lability.  Her blood pressure was increased and on repeat by me was 190/84.  At that time I recommended initiation of hydralazine 25 mg every 8 hours as well as spironolactone 12.5 mg daily.  She already was on amlodipine 10 mg, metoprolol tartrate 25 mg in the morning and 12.5 mg at night in addition to olmesartan 40 mg daily.  I scheduled her for renal duplex study to evaluate for potential renal vascular etiology.  She was tolerating rosuvastatin 3 days/week and I recommended slight titration to every other day at the 5 mg dose and she continues to be on Zetia 10 mg daily.  She was seen by Greg Cutter on Mar 14, 2021 for Pharm.D. follow-up evaluation.  She had been started on the spironolactone 12.5 mg daily.  I follow-up bmet showed a BUN of 16 and creatinine 1.34, slightly increased from 1.142 months previously.  Renal duplex study did not demonstrate evidence for renal artery stenosis.  She had abnormal right  resistive index and normal cortical thickness of the right kidney.  The left kidney appeared somewhat atrophic.  There was a normal left resistive index and normal cortical thickness.  I last saw her on Apr 06, 2021 which her blood pressure was improved but at times she states it may be elevated around 150/78.  She had undergone colonoscopy last week and tolerated that well.  She admits to fatigability.  She has been taking metoprolol titrate 12.5 mg twice a day, amlodipine 10 mg, hydralazine 25 mg every 8 hours, olmesartan 40 mg, spironolactone  12.5 mg, and combination of Zetia and rosuvastatin 5 mg up to 3 times per week.  During that evaluation I suggested slight titration of spironolactone the 225 mg daily.  I recommended that she change her metoprolol to metoprolol succinate 12.5 mg and she take this at bedtime.  She was continuing to tolerate rosuvastatin 5 mg 3 times per week in addition to Zetia 10 mg daily.  Since I saw her, she was evaluated by Fabian Sharp, PA-C in October 2022 and Rollen Sox, [facility] on October 03, 2021 a Apparently she was not wanting to take medications.  Prior to his visit her metoprolol and spironolactone were discontinued and she remained on amlodipine 10 mg, olmesartan  40 mg and hydralazine 50 mg 3 times daily.  During his evaluation her blood pressure was extremely elevated at 205/82 and it was recommended that hydralazine be increased to 100 mg 3 times a day.  I last saw her on October 10, 2021.  She denied any chest pain or significant shortness of breath.  Her blood pressure has remained elevated but is somewhat improved.  She admits to back pain and continues to have blood pressure lability.  She continued to be on amlodipine 10 mg, hydralazine 100 mg 3 times a day, and olmesartan 40 mg daily.  She also is on Fioricet as needed for back pain and continues to be on 10 mg.  For that evaluation, I recommended she initiate doxazosin for improved blood pressure control  initially at 1 mg with potential titration as blood pressure allows.  She presents to the office today planing of recent blood pressure lability and also she has experienced some vertigo.  She denies any chest pain or swelling.  She has continued to have progressive problems with her low back and is scheduled to undergo surgery by Dr. Ellene Route  L4-L5 fusion on [place] 15.  She has experienced left leg and foot numbness.  She presents for evaluation.  Past Medical History:  Diagnosis Date   Anxiety    on meds   Back pain    Chronic female pelvic pain    Coronary artery disease    mild, non-obstructive 11/2018   Depression    on meds   Family history of adverse reaction to anesthesia    sister had difficulty waking up   Fibromyalgia    H/O leukocytosis    Headache    Hyperlipidemia    on meds   Hypertension    on meds   MI (myocardial infarction) ([place])    Pt states she did not have a MI- EKG was normal, was GERD   Osteoarthritis    on meds   Ovarian cyst, right    PONV (postoperative nausea and vomiting)    Post-operative nausea and vomiting    SVD (spontaneous vaginal delivery)    x 2   Vitamin D deficiency     Past Surgical History:  Procedure Laterality Date   ABDOMINAL HYSTERECTOMY  1994   TAH.BSO   ANTERIOR CERVICAL DECOMP/DISCECTOMY FUSION  2019   APPENDECTOMY  1975   BACK SURGERY     BIOPSY  05/12/2020   Procedure: BIOPSY;  Surgeon: Milus Banister, MD;  Location: [facility];  Service: Endoscopy;;   CARDIAC CATHETERIZATION  2020   CHOLECYSTECTOMY N/A 07/21/2020   Procedure: LAPAROSCOPIC CHOLECYSTECTOMY WITH INTRAOPERATIVE CHOLANGIOGRAM;  Surgeon: [name], MD;  Location: [facility];  Service: General;  Laterality: N/A;   COLONOSCOPY  08/12/2017   Hx TA (piecemeal)Jacobs-MAC-suprep (good)  ESOPHAGOGASTRODUODENOSCOPY (EGD) WITH PROPOFOL N/A 05/12/2020   Procedure: ESOPHAGOGASTRODUODENOSCOPY (EGD) WITH PROPOFOL;  Surgeon: Milus Banister, MD;  Location: [facility];   Service: Endoscopy;  Laterality: N/A;   EUS N/A 05/12/2020   Procedure: UPPER ENDOSCOPIC ULTRASOUND (EUS) RADIAL;  Surgeon: Milus Banister, MD;  Location: [facility];  Service: Endoscopy;  Laterality: N/A;   KNEE SURGERY Bilateral 1996   x 2 - arthroscopic   LEFT HEART CATH AND CORONARY ANGIOGRAPHY N/A 11/25/2018   Procedure: LEFT HEART CATH AND CORONARY ANGIOGRAPHY;  Surgeon: Troy Sine, MD;  Location: [facility];  Service: Cardiovascular;  Laterality: N/A;   PELVIC LAPAROSCOPY  1989   W LYSIS OF ADHESIONS/L SALPINGONEOSTOMY   TUBAL LIGATION     WISDOM TOOTH EXTRACTION      Current Medications: Outpatient Medications Prior to Visit  Medication Sig Dispense Refill   ALPRAZolam (XANAX) 1 MG tablet Take 0.5 mg by mouth at bedtime as needed for sleep.     amLODipine (NORVASC) 10 MG tablet TAKE 1 TABLET(10 MG) BY MOUTH DAILY 90 tablet 3   Ascorbic Acid (VITAMIN C) 1000 MG tablet Take 1,000 mg by mouth in the morning.     butalbital-acetaminophen-caffeine (FIORICET, ESGIC) 50-325-40 MG tablet Take 1 tablet by mouth daily as needed for headache.  0   Cholecalciferol (VITAMIN D) 50 MCG (2000 UT) tablet Take 2,000 Units by mouth in the morning.     ezetimibe (ZETIA) 10 MG tablet TAKE 1 TABLET(10 MG) BY MOUTH DAILY 90 tablet 3   hydrALAZINE (APRESOLINE) 100 MG tablet Take 1 tablet (100 mg total) by mouth 3 (three) times daily. (Patient taking differently: Take 100 mg by mouth in the morning.) 270 tablet 1   HYDROcodone-acetaminophen (NORCO) 10-325 MG tablet Take 1 tablet by mouth 3 (three) times daily as needed for moderate pain.     Lidocaine (BLUE-EMU PAIN RELIEF DRY EX) Apply 1 application topically daily as needed (pain).     olmesartan (BENICAR) 40 MG tablet Take 1 tablet (40 mg total) by mouth daily. (Patient taking differently: Take 20 mg by mouth in the morning.) 90 tablet 3   valACYclovir (VALTREX) 500 MG tablet TAKE 1 TABLET BY MOUTH TWICE DAILY FOR 3 TO 5 DAYS THEN TAKE  DAILY AS NEEDED (Patient taking differently: Take 500 mg by mouth daily as needed (outbreaks).) 30 tablet 0   zolpidem (AMBIEN) 10 MG tablet Take 10 mg by mouth at bedtime as needed for sleep.      No facility-administered medications prior to visit.     Allergies:   Flexeril [cyclobenzaprine], Zanaflex [tizanidine], and Latex   Social History   Socioeconomic History   Marital status: Married    Spouse name: don   Number of children: 2   Years of education: College   Highest education level: Not on file  Occupational History   Occupation: Realtor  Tobacco Use   Smoking status: Former    Packs/day: 0.15    Years: 20.00    Total pack years: 3.00    Types: Cigarettes    Quit date: 11/12/1998    Years since quitting: 23.4   Smokeless tobacco: Never  Vaping Use   Vaping Use: Never used  Substance and Sexual Activity   Alcohol use: Not Currently   Drug use: No   Sexual activity: Not Currently    Birth control/protection: Post-menopausal, Surgical    Comment: HYSTERECTOMY  Other Topics Concern   Not on file  Social History Narrative  Lives at home with her husband Timmothy Sours   Right handed   Caffeine: unsweet tea, 2 glasses daily   Social Determinants of Health   Financial Resource Strain: Not on file  Food Insecurity: Not on file  Transportation Needs: Not on file  Physical Activity: Not on file  Stress: Not on file  Social Connections: Not on file    Social history is notable in that she has had several marriages and currently has been married for 16 years.  She has 2 children, 2 grandchildren.  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  There is a higher tobacco history, she quit in 2002.  She does yoga intermittently.  Family History:  The patient's family history includes Aneurysm in her father; COPD in her brother; Heart disease in her paternal grandfather; Other in her sister and sister; Skin cancer in her brother and brother; Uterine cancer (age of onset: 9) in her  mother.   Her mother died at age 24.  Her father died at age 42 and had blood clots.  A brother died at age 89 and was exposed to agent orange.  She has a brother age 48 with cancer.  She has 5 sisters.   ROS General: Negative; No fevers, chills, or night sweats;  HEENT: Negative; No changes in vision or hearing, sinus congestion, difficulty swallowing Pulmonary: Negative; No cough, wheezing, shortness of breath, hemoptysis Cardiovascular: See HPI GI: Recent pancreatitis and cholecystectomy GU: History of UTIs Musculoskeletal: Chronic low back pain, osteo-arthritis, fibromyalgia Hematologic/Oncology: Negative; no easy bruising, bleeding Endocrine: Negative; no heat/cold intolerance; no diabetes Neuro: Negative; no changes in balance, headaches Skin: Negative; No rashes or skin lesions Psychiatric: Admits to increased stress Sleep: Negative; No snoring, daytime sleepiness, hypersomnolence, bruxism, restless legs, hypnogognic hallucinations, no cataplexy Other comprehensive 14 point system review is negative.   PHYSICAL EXAM:   VS:  BP (!) 160/80 (BP Location: Left Arm)   Pulse 66   Ht _0  (1.6 m)   Wt 153 lb 9.6 oz (69.7 kg)   LMP  (LMP Unknown)   SpO2 98%   BMI 27.21 kg/m     Repeat blood pressure by me was 160/84 supine and 160/80 standing.  Wt Readings from Last 3 Encounters:  04/26/22 152 lb (68.9 kg)  04/18/22 152 lb 9.6 oz (69.2 kg)  04/18/22 153 lb 9.6 oz (69.7 kg)    General: Alert, oriented, no distress.  Skin: normal turgor, no rashes, warm and dry HEENT: Normocephalic, atraumatic. Pupils equal round and reactive to light; sclera anicteric; extraocular muscles intact; Nose without nasal septal hypertrophy Mouth/Parynx benign; Mallinpatti scale 3 Neck: No JVD, no carotid bruits; normal carotid upstroke Lungs: clear to ausculatation and percussion; no wheezing or rales Chest wall: without tenderness to palpitation Heart: PMI not displaced, RRR, s1 s2 normal,  1/6 systolic murmur, no diastolic murmur, no rubs, gallops, thrills, or heaves Abdomen: soft, nontender; no hepatosplenomehaly, BS+; abdominal aorta nontender and not dilated by palpation. Back: no CVA tenderness Pulses 2+ Musculoskeletal: full range of motion, normal strength, no joint deformities Extremities: no clubbing cyanosis or edema, Homan's sign negative  Neurologic: grossly nonfocal; Cranial nerves grossly wnl Psychologic: Normal mood and affect  Studies/Labs Reviewed:   Onedia 7, 2023 ECG (independently read by me):  NSR at 66; LAE, QTc 471 msec  October 10, 2021 ECG (independently read by me):  NSR at 65, nonspecific T abnormality  Apr 06, 2021 ECG (independently read by me): Sinus bradycardia 54 bpm, possible  left atrial enlargement.  Nonspecific ST changes.  Normal intervals.  No ectopy.  February 17, 2021 ECG (independently read by me): Sinus bradycardia at 56; Mild ST changes  February 2022 ECG (independently read by me): Sinus bradycardia 53 bpm.  No ectopy.  Normal intervals     July 20, 2021ECG (independently read by me): Normal sinus rhythm at 66 bpm.  Left atrial enlargement.  Nonspecific ST changes.  QTc interval 461 Robin.  March 2020 ECG (independently read by me): Sinus bradycardia 59 bpm.  Normal intervals.  No ectopy.  November 20, 2018 EKG:  EKG is ordered today. Sinus Bradycardia at 58;Q wave III, no STT changes  October 06, 2018 ECG (independently read by me): Normal sinus rhythm at 66 bpm.  Nondiagnostic Q waves in lead III and aVF.  Normal intervals.  No ectopy.  No ST segment changes.  Recent Labs:    Latest Ref Rng & Units 04/27/2022    5:13 AM 04/18/2022    2:46 PM 08/28/2021    3:39 PM  BMP  Glucose 70 - 99 mg/dL 134  104  102   BUN 8 - 23 mg/dL _0 Creatinine 0.44 - 1.00 mg/dL 1.40  1.67  1.22   BUN/Creat Ratio 12 - 28   13   Sodium 135 - 145 mmol/L 138  134  136   Potassium 3.5 - 5.1 mmol/L 4.4  3.6  3.7   Chloride 98 - 111 mmol/L 103  104   99   CO2 22 - 32 mmol/L _1 Calcium 8.9 - 10.3 mg/dL 8.9  9.4  9.3         Latest Ref Rng & Units 06/15/2021    2:26 PM 01/20/2021   12:30 PM 10/13/2020   12:12 PM  Hepatic Function  Total Protein 6.0 - 8.5 g/dL 7.0  6.9  7.5   Albumin 3.8 - 4.8 g/dL 4.4  4.1  4.4   AST 0 - 40 IU/L _2 ALT 0 - 32 IU/L _3 Alk Phosphatase 44 - 121 IU/L 103  94  97   Total Bilirubin 0.0 - 1.2 mg/dL <0.2  0.2  0.3        Latest Ref Rng & Units 04/27/2022    5:13 AM 04/18/2022    2:46 PM 08/28/2021    3:39 PM  CBC  WBC 4.0 - 10.5 K/uL 16.6  9.3  9.7   Hemoglobin 12.0 - 15.0 g/dL 10.2  10.9  11.2   Hematocrit 36.0 - 46.0 % 30.3  33.9  32.8   Platelets 150 - 400 K/uL 309  297  304    Lab Results  Component Value Date   MCV 95.3 04/27/2022   MCV 98.8 04/18/2022   MCV 95 08/28/2021   Lab Results  Component Value Date   TSH 1.070 08/28/2021   No results found for: "HGBA1C"   BNP No results found for: "BNP"  ProBNP No results found for: "PROBNP"   Lipid Panel     Component Value Date/Time   CHOL 208 (H) 01/20/2021 1230   TRIG 122 01/20/2021 1230   HDL 68 01/20/2021 1230   CHOLHDL 3.1 01/20/2021 1230   LDLCALC 119 (H) 01/20/2021 1230     RADIOLOGY: DG Lumbar Spine 2-3 Views  Result Date: 04/26/2022 CLINICAL DATA:  L4-L5 PLIF EXAM: LUMBAR SPINE - 2-3 VIEW COMPARISON:  04/26/2022 intraoperative images FINDINGS: Two fluoroscopic images are obtained during the performance of the procedure and are provided for interpretation only. Images demonstrate posterior fusion of L4 and L5, with interbody disc spacer. Fluoroscopy time: 4 seconds 2.29 mGy IMPRESSION: Intraoperative fluoroscopy of L4-L5 PLIF. Electronically Signed   By: Merilyn Baba M.D.   On: 04/26/2022 17:00   DG C-Arm 1-60 Min-No Report  Result Date: 04/26/2022 Fluoroscopy was utilized by the requesting physician.  No radiographic interpretation.   DG Lumbar Spine 1 View  Result Date:  04/26/2022 CLINICAL DATA:  Localization lumbar fusion EXAM: LUMBAR SPINE - 1 VIEW COMPARISON:  None Available. FINDINGS: Single intraoperative lateral view lumbar spine demonstrates a blunt tip probe posterior to the L4-L5 disc space. Skin spreaders at same level. IMPRESSION: Intraoperative view as above. Electronically Signed   By: Suzy Bouchard M.D.   On: 04/26/2022 16:56     Additional studies/ records that were reviewed today include:  I reviewed the records from the emergency room.  I reviewed the records from [place] center at [facility] by [employer].  Records from [facility] were reviewed  ------------------------------------------------------------------- 10/14/2018 ECHO Study Conclusions   - Left ventricle: Average global longitudinal LV strain is normal   at -18.7% The cavity size was normal. Systolic function was   normal. The estimated ejection fraction was in the range of 60%   to 65%. Wall motion was normal; there were no regional wall   motion abnormalities. The study is not technically sufficient to   allow evaluation of LV diastolic function. - Aortic valve: Trileaflet; mildly thickened, mildly calcified   leaflets. - Mitral valve: There was mild regurgitation. - Atrial septum: There was increased thickness of the septum,   consistent with lipomatous hypertrophy.    RENAL DUPLEX: 02/22/2021 Summary:  Renal:     Right: No evidence of right renal artery stenosis. RRV flow present.         Normal size right kidney. Abnormal right Resistive Index.         Normal cortical thickness of right kidney.  Left:  No evidence of left renal artery stenosis. LRV flow present.         Abnormal size for the left kidney; appears atrophic. Normal         left Resistive Index. Normal cortical thickness of the left         kidney.  Mesenteric:  Normal Celiac artery and Superior Mesenteric artery findings.       ASSESSMENT:    1. Coronary artery disease  involving native coronary artery of native heart without angina pectoris   2. Primary hypertension   3. Hyperlipidemia with target LDL less than 70   4. Preoperative cardiovascular examination   5. Chronic left-sided low back pain with left-sided sciatica   6. Mixed hyperlipidemia   7. Cervical disc disease     PLAN:  Robin Arellano is a 71 year old female who had experienced intermittent episodes of atypical chest discomfort initially was sent to me due to concerns for possibly have had a prior heart attack for which she was unaware.  Her chest pain was nonexertional.  She had small non-diagnostic inferior Q waves. She has had issues with stage II hypertension and her blood pressure was significantly elevated at her emergency room evaluation.  When I initially saw her I recommended the addition of amlodipine to her medical regimen. An echo Doppler study demonstrated normal systolic without wall motion abnormalities and with normal global  longitudinal strain.  There was evidence for mild aortic sclerosis without stenosis, mild MR, and probable lipomatous hypertrophy of her atrial septum.  She  continued to experience sharp stabbing-like chest pain and previously on physical examination today she had definite tenderness to her costochondral region pectoral region suggesting her chest pain is most likely of musculoskeletal etiology.  Her CT angiogram raised the possibility of at least moderate multivessel coronary atherosclerosis and her distal LAD lesion was felt to be FFR significant.  Definitive cardiac catheterization did not  demonstrate high-grade obstructive disease with no narrowings greater than 25% in the LAD circumflex marginal and dominant RCA.  She has subsequently been found to have multilevel cervical degenerative spondylolisthesis as well as cervical disc disease as noted above.  She underwent successful cervical disc surgery by Dr. Vertell Limber with significant benefit in improvement in her  severe bilateral arm discomfort.  She subsequently had an episode of pancreatitis with dilated bile duct and underwent successful cholecystectomy on July 26, 2020.  Throughout this year, she has had continued difficulty with blood pressure lability and required additional medication titration.  A renal duplex study did not reveal evidence for an renal artery stenosis.  When seen by me in November 2022 she was under significant increased stress and stopped taking several medications including spironolactone, metoprolol taking amlodipine 10 mg hydralazine 100 mg 3 times a day and olmesartan 40 mg.  Due to continued blood pressure elevation she was started on doxazosin.  Presently, she states she has been only taking hydralazine 100 mg 1 time a day and has continued to be on olmesartan 40 mg amlodipine 10 mg and is no longer on doxazosin.  She not have any anginal symptomatology or significant leg swelling today.  Her blood pressure continues to be elevated today and I have suggested she at least increase hydralazine to 100 mg twice a day.  She has had some vertigo and had been given a trial of meclizine.  She is scheduled to undergo surgery with Dr. Kristeen Miss with L4-L5 fusion and I have given her clearance for her tentative surgical date of Gessica 15, 2023.  I will see her in 4 months for reevaluation or sooner as needed.    Medication Adjustments/Labs and Tests Ordered: Current medicines are reviewed at length with the patient today.  Concerns regarding medicines are outlined above.  Medication changes, Labs and Tests ordered today are listed in the Patient Instructions below. Patient Instructions  Medication Instructions:    Increase Hydralazine  twice a day   *If you need a refill on your cardiac medications before your next appointment, please call your pharmacy*   Lab Work: Not needed    Testing/Procedures:    Follow-Up: At [facility], you and your health needs are our priority.   As part of our continuing mission to provide you with exceptional heart care, we have created designated Provider Care Teams.  These Care Teams include your primary Cardiologist (physician) and Advanced Practice Providers (APPs -  Physician Assistants and Nurse Practitioners) who all work together to provide you with the care you need, when you need it.     Your next appointment:   4 month(s)  The format for your next appointment:   In Person  Provider:   Shelva Majestic, MD    Other Instructions    Signed, Shelva Majestic, MD  05/03/2022 7:01 PM    [facility] [address], [place], [place], [place]  [address] Phone: [phone])  [phone]

## 2022-04-19 NOTE — Progress Notes (Signed)
Anesthesia Chart Review:  Case: [id number] Date/Time: 04/26/22 1155   Procedure: L4-5 PLIF (Back) - 3C/RM 20 (to follow)   Anesthesia type: General   Pre-op diagnosis: Spondylolisthesis   Location: [facility] OR ROOM 99 / [facility]   Surgeons: Kristeen Miss, MD       DISCUSSION: Patient is a 71 year old female scheduled for the above procedure.  History includes former smoker (3 pack years, quit 2000), post-operative N/V, CAD (mild, non-obstructive 11/2018), HTN (no renal artery stenosis by 02/22/21 [place]), anxiety, depression, ADHD, fibromyalgia, spinal surgery ([facility] 2020), cholecystectomy (07/21/20).   Last cardiology evaluation 04/18/22 by Dr. Claiborne Billings for routine follow-up and preoperative evaluation. BP 160/80. He increased her hydralazine 100 mg to BID (she was taking only daily). Also amlodipine 10 mg daily and olmesartan 20 mg daily. Note is still pended.     ADDENDUM 04/25/22 5:17 PM:  04/18/22 cardiology note is still pended. Dr. Claiborne Billings did adjust antihypertensives, but did not order any new cardiac testing. I Contacted [facility] earlier today regarding status of preoperative evaluation since surgery scheduled for 04/26/22. Cardiology APP was going to reach back out to Dr. Claiborne Billings since he was reportedly back at work after being out for a few days.      VS: BP (!) 170/87 Comment: manual  Pulse 61   Temp 36.6 C (Oral)   Resp 17   Ht _0  (1.6 m)   Wt 69.2 kg   LMP  (LMP Unknown)   SpO2 98%   BMI 27.03 kg/m  BP Readings from Last 3 Encounters:  04/18/22 (!) 170/87  04/18/22 (!) 160/80  10/25/21 (!) 167/65     PROVIDERS: Sandi Mariscal, MD is PCP ([place]) Shelva Majestic, MD is cardiologist. She has also been followed at [facility].  Owens Loffler, MD is GI   LABS: Creatinine 1.67, previously 1.22-1.66 since May 2022. H/H 10.9/33.9, previously 11.2/32.8 since 08/28/21.  (all labs ordered are listed, but only abnormal results are displayed)  Labs Reviewed   SURGICAL PCR SCREEN - Abnormal; Notable for the following components:      Result Value   Staphylococcus aureus POSITIVE (*)    All other components within normal limits  BASIC METABOLIC PANEL - Abnormal; Notable for the following components:   Sodium 134 (*)    CO2 20 (*)    Glucose, Bld 104 (*)    Creatinine, Ser 1.67 (*)    GFR, Estimated 33 (*)    All other components within normal limits  CBC - Abnormal; Notable for the following components:   RBC 3.43 (*)    Hemoglobin 10.9 (*)    HCT 33.9 (*)    All other components within normal limits  TYPE AND SCREEN     IMAGES: CT L-spine/Myelogram 10/25/21: IMPRESSION: 1. Spinal degeneration primarily affecting the L4-5 and L5-S1 facets with anterolisthesis. 2. No neural compression. Up to mild foraminal stenosis on the left at L4-5.  MRI L-spine 06/06/21: IMPRESSION: Disc and facet degeneration L4-5 and L5-S1. No significant stenosis or neural impingement. No significant change since 2017 MRI.   EKG: 04/18/22:  Normal sinus rhythm Possible left atrial enlargement Cannot rule out anterior infarct, age undetermined   CV: Cardiac cath 11/25/18: Prox RCA lesion is 20% stenosed. Mid RCA lesion is 20% stenosed. Ost 1st Mrg lesion is 25% stenosed. Prox LAD lesion is 25% stenosed. Mid LAD lesion is 20% stenosed. Dist LAD lesion is 20% stenosed. Mid LM lesion is 5% stenosed. - Mild multi-vessel  nonobstructive CAD with narrowings no greater than 25% in the LAD, circumflex marginal, and dominant  RCA. - LVEDP 10 mm Hg. RECOMMENDATION: Medical therapy.  Aggressive lipid-lowering therapy with target LDL less than 70.  The patient's chest pain has atypical features and is most likely nonischemic.     CT Coronary/FFR 11/06/18: IMPRESSION: 1. The patient's coronary artery calcium score is 4, which places the patient in the 51 percentile. This is average for what is expected compared to age and gender matched peers. 2. Normal  coronary origin with right dominance. 3. Severe CAD in the distal LAD, CADRADS = 4. Moderate CAD in the proximal RCA, mid LAD, and proximal left circumflex artery. - CT FFR: 1. Left Main:  No significant stenosis. FFR = 0.98 2. LAD: No significant stenosis. Proximal FFR = 0.94, Mid FFR = 0.83, Distal FFR = 0.57 3. LCX: No significant stenosis. Proximal FFR = 0.87, Distal FFR = 0.81, OM1 = 0.83, OM2 = 0.82 4. RCA: No significant stenosis. Proximal FFR = 0.93, Mid FFR = 0.87,  Distal FFR = 0.85 IMPRESSION: 1. CT FFR analysis showed severe stenosis in the distal LAD. No other hemodynamically significant stenoses detected.     Echo 10/14/2018: Study Conclusions  - Left ventricle: Average global longitudinal LV strain is normal    at -18.7% The cavity size was normal. Systolic function was    normal. The estimated ejection fraction was in the range of 60%    to 65%. Wall motion was normal; there were no regional wall    motion abnormalities. The study is not technically sufficient to    allow evaluation of LV diastolic function.  - Aortic valve: Trileaflet; mildly thickened, mildly calcified    leaflets. There was no evidence for stenosis. There was mild    regurgitation.   - Mitral valve: There was mild regurgitation.  - Atrial septum: There was increased thickness of the septum,    consistent with lipomatous hypertrophy.    Past Medical History:  Diagnosis Date   Anxiety    on meds   Back pain    Chronic female pelvic pain    Coronary artery disease    mild, non-obstructive 11/2018   Depression    on meds   Family history of adverse reaction to anesthesia    sister had difficulty waking up   Fibromyalgia    H/O leukocytosis    Headache    Hyperlipidemia    on meds   Hypertension    on meds   MI (myocardial infarction) ([place])    Pt states she did not have a MI- EKG was normal, was GERD   Osteoarthritis    on meds   Ovarian cyst, right    PONV (postoperative nausea and  vomiting)    Post-operative nausea and vomiting    SVD (spontaneous vaginal delivery)    x 2   Vitamin D deficiency     Past Surgical History:  Procedure Laterality Date   ABDOMINAL HYSTERECTOMY  1994   TAH.BSO   ANTERIOR CERVICAL DECOMP/DISCECTOMY FUSION  2019   APPENDECTOMY  1975   BACK SURGERY     BIOPSY  05/12/2020   Procedure: BIOPSY;  Surgeon: Milus Banister, MD;  Location: [facility];  Service: Endoscopy;;   CARDIAC CATHETERIZATION  2020   CHOLECYSTECTOMY N/A 07/21/2020   Procedure: LAPAROSCOPIC CHOLECYSTECTOMY WITH INTRAOPERATIVE CHOLANGIOGRAM;  Surgeon: [name], MD;  Location: [facility];  Service: General;  Laterality: N/A;   COLONOSCOPY  08/12/2017  Hx TA (piecemeal)Jacobs-MAC-suprep (good)   ESOPHAGOGASTRODUODENOSCOPY (EGD) WITH PROPOFOL N/A 05/12/2020   Procedure: ESOPHAGOGASTRODUODENOSCOPY (EGD) WITH PROPOFOL;  Surgeon: Milus Banister, MD;  Location: [facility];  Service: Endoscopy;  Laterality: N/A;   EUS N/A 05/12/2020   Procedure: UPPER ENDOSCOPIC ULTRASOUND (EUS) RADIAL;  Surgeon: Milus Banister, MD;  Location: [facility];  Service: Endoscopy;  Laterality: N/A;   KNEE SURGERY Bilateral 1996   x 2 - arthroscopic   LEFT HEART CATH AND CORONARY ANGIOGRAPHY N/A 11/25/2018   Procedure: LEFT HEART CATH AND CORONARY ANGIOGRAPHY;  Surgeon: Troy Sine, MD;  Location: [facility];  Service: Cardiovascular;  Laterality: N/A;   PELVIC LAPAROSCOPY  1989   W LYSIS OF ADHESIONS/L SALPINGONEOSTOMY   TUBAL LIGATION     WISDOM TOOTH EXTRACTION      MEDICATIONS:  ALPRAZolam (XANAX) 1 MG tablet   amLODipine (NORVASC) 10 MG tablet   Ascorbic Acid (VITAMIN C) 1000 MG tablet   butalbital-acetaminophen-caffeine (FIORICET, ESGIC) 50-325-40 MG tablet   Cholecalciferol (VITAMIN D) 50 MCG (2000 UT) tablet   ezetimibe (ZETIA) 10 MG tablet   hydrALAZINE (APRESOLINE) 100 MG tablet   HYDROcodone-acetaminophen (NORCO) 10-325 MG tablet   Lidocaine (BLUE-EMU PAIN RELIEF  DRY EX)   olmesartan (BENICAR) 40 MG tablet   valACYclovir (VALTREX) 500 MG tablet   zolpidem (AMBIEN) 10 MG tablet   No current facility-administered medications for this encounter.    Myra Gianotti, PA-C Surgical Short Stay/Anesthesiology [facility] Phone [phone] [facility] Phone [phone] 04/19/2022 5:24 PM

## 2022-04-23 ENCOUNTER — Encounter: Payer: Self-pay | Admitting: Cardiovascular Disease

## 2022-04-23 NOTE — Telephone Encounter (Signed)
Patient seen in office 06/07. Thanks!

## 2022-04-25 ENCOUNTER — Ambulatory Visit: Payer: PPO

## 2022-04-25 ENCOUNTER — Telehealth: Payer: Self-pay | Admitting: Cardiovascular Disease

## 2022-04-25 NOTE — Telephone Encounter (Signed)
Patient was seen by Dr. Claiborne Billings on 04/18/22, however clearance for lumbar fusion was not addressed in the office note.  We have forwarded request for clearance to Dr. Claiborne Billings to address concerns about her elevated BP. Patient would need to be reassessed if there is no response from primary cardiologist prior to surgery.   Emmaline Life, NP-C    04/25/2022, 4:50 PM [facility] [address], [address] Office [phone] Fax [phone]

## 2022-04-25 NOTE — Telephone Encounter (Signed)
   Pre-operative Risk Assessment    Patient Name: Robin Arellano  DOB: July 16, 1951 MRN: [id number]      Request for Surgical Clearance    Procedure:   L4L5 Posterior Lumbar Fusion  Date of Surgery:  Clearance 04/26/22                                 Surgeon:  Kristeen Miss Surgeon's Group or Practice Name:  [facility] Phone number:  [phone] Fax number:  [phone]   Type of Clearance Requested:   - Medical    Type of Anesthesia:  General    Additional requests/questions:    Dorthey Sawyer   04/25/2022, 1:48 PM

## 2022-04-25 NOTE — Anesthesia Preprocedure Evaluation (Addendum)
Anesthesia Evaluation  Patient identified by MRN, date of birth, ID band Patient awake    Reviewed: Allergy & Precautions, NPO status , Patient's Chart, lab work & pertinent test results  History of Anesthesia Complications (+) PONV and history of anesthetic complications  Airway Mallampati: II  TM Distance: >3 FB Neck ROM: Full    Dental  (+) Dental Advisory Given, Teeth Intact   Pulmonary former smoker,    Pulmonary exam normal        Cardiovascular hypertension, Pt. on medications + CAD  Normal cardiovascular exam   '20 Cath - Mild multi-vessel nonobstructive CAD with narrowings no greater than 25% in the LAD, circumflex marginal, and dominant  RCA.    Neuro/Psych  Headaches, PSYCHIATRIC DISORDERS Anxiety Depression  Vertigo     GI/Hepatic negative GI ROS, Neg liver ROS,   Endo/Other  negative endocrine ROS  Renal/GU Renal InsufficiencyRenal disease    Chronic pelvic pain     Musculoskeletal  (+) Arthritis , Osteoarthritis,  Fibromyalgia -  Abdominal   Peds  Hematology  (+) Blood dyscrasia, anemia ,   Anesthesia Other Findings   Reproductive/Obstetrics                           Anesthesia Physical Anesthesia Plan  ASA: 3  Anesthesia Plan: General   Post-op Pain Management: Tylenol PO (pre-op)* and Ketamine IV*   Induction: Intravenous  PONV Risk Score and Plan: 4 or greater and Treatment may vary due to age or medical condition, Ondansetron, Dexamethasone and Propofol infusion  Airway Management Planned: Oral ETT  Additional Equipment: None  Intra-op Plan:   Post-operative Plan: Extubation in OR  Informed Consent: I have reviewed the patients History and Physical, chart, labs and discussed the procedure including the risks, benefits and alternatives for the proposed anesthesia with the patient or authorized representative who has indicated his/her understanding and  acceptance.     Dental advisory given  Plan Discussed with: CRNA and Anesthesiologist  Anesthesia Plan Comments:       Anesthesia Quick Evaluation

## 2022-04-26 ENCOUNTER — Observation Stay (HOSPITAL_COMMUNITY)
Admission: RE | Admit: 2022-04-26 | Discharge: 2022-04-27 | Disposition: A | Discharge status: Home Health | Payer: PPO | Attending: Neurological Surgery | Admitting: Neurological Surgery

## 2022-04-26 ENCOUNTER — Encounter (HOSPITAL_COMMUNITY)
Admission: RE | Disposition: A | Discharge status: Home Health | Payer: Self-pay | Source: Home / Self Care | Attending: Neurological Surgery

## 2022-04-26 ENCOUNTER — Ambulatory Visit (HOSPITAL_BASED_OUTPATIENT_CLINIC_OR_DEPARTMENT_OTHER): Payer: PPO | Admitting: Anesthesiology

## 2022-04-26 ENCOUNTER — Encounter (HOSPITAL_COMMUNITY): Payer: Self-pay | Admitting: Neurological Surgery

## 2022-04-26 ENCOUNTER — Other Ambulatory Visit: Payer: Self-pay

## 2022-04-26 ENCOUNTER — Ambulatory Visit (HOSPITAL_COMMUNITY): Payer: PPO

## 2022-04-26 ENCOUNTER — Ambulatory Visit (HOSPITAL_COMMUNITY): Payer: PPO | Admitting: Vascular Surgery

## 2022-04-26 ENCOUNTER — Telehealth: Payer: Self-pay

## 2022-04-26 DIAGNOSIS — I10 Essential (primary) hypertension: Secondary | ICD-10-CM | POA: Insufficient documentation

## 2022-04-26 DIAGNOSIS — I2510 Atherosclerotic heart disease of native coronary artery without angina pectoris: Secondary | ICD-10-CM

## 2022-04-26 DIAGNOSIS — Z79899 Other long term (current) drug therapy: Secondary | ICD-10-CM | POA: Insufficient documentation

## 2022-04-26 DIAGNOSIS — Z91040 Latex allergy status: Secondary | ICD-10-CM | POA: Diagnosis not present

## 2022-04-26 DIAGNOSIS — M4316 Spondylolisthesis, lumbar region: Principal | ICD-10-CM

## 2022-04-26 DIAGNOSIS — Z87891 Personal history of nicotine dependence: Secondary | ICD-10-CM | POA: Diagnosis not present

## 2022-04-26 DIAGNOSIS — M5416 Radiculopathy, lumbar region: Secondary | ICD-10-CM | POA: Diagnosis not present

## 2022-04-26 IMAGING — RF DG LUMBAR SPINE 2-3V
1 series · 2 of 2 positions shown · non-contrast
Comparison: [DATE] intraoperative images

CLINICAL DATA: L4-L5 PLIF

EXAM:
LUMBAR SPINE - 2-3 VIEW

[Series 1: unknown protocol · 0.14mm/px · 2 of 2 slices shown]
[im 1/2]
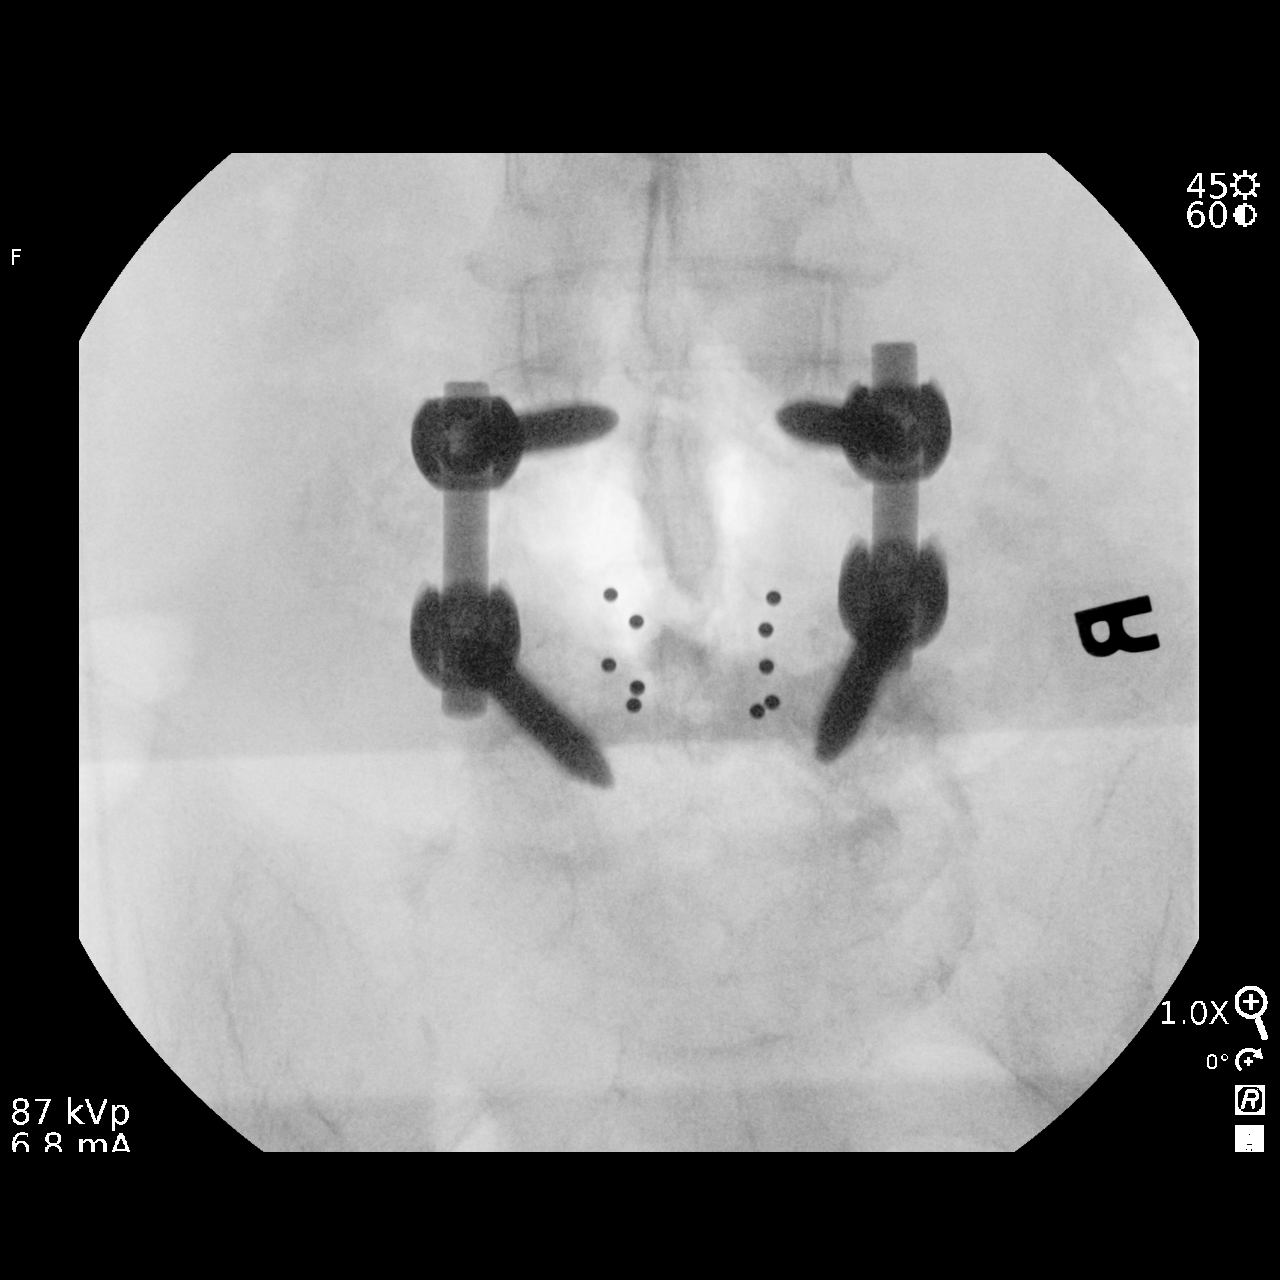
[im 2/2]
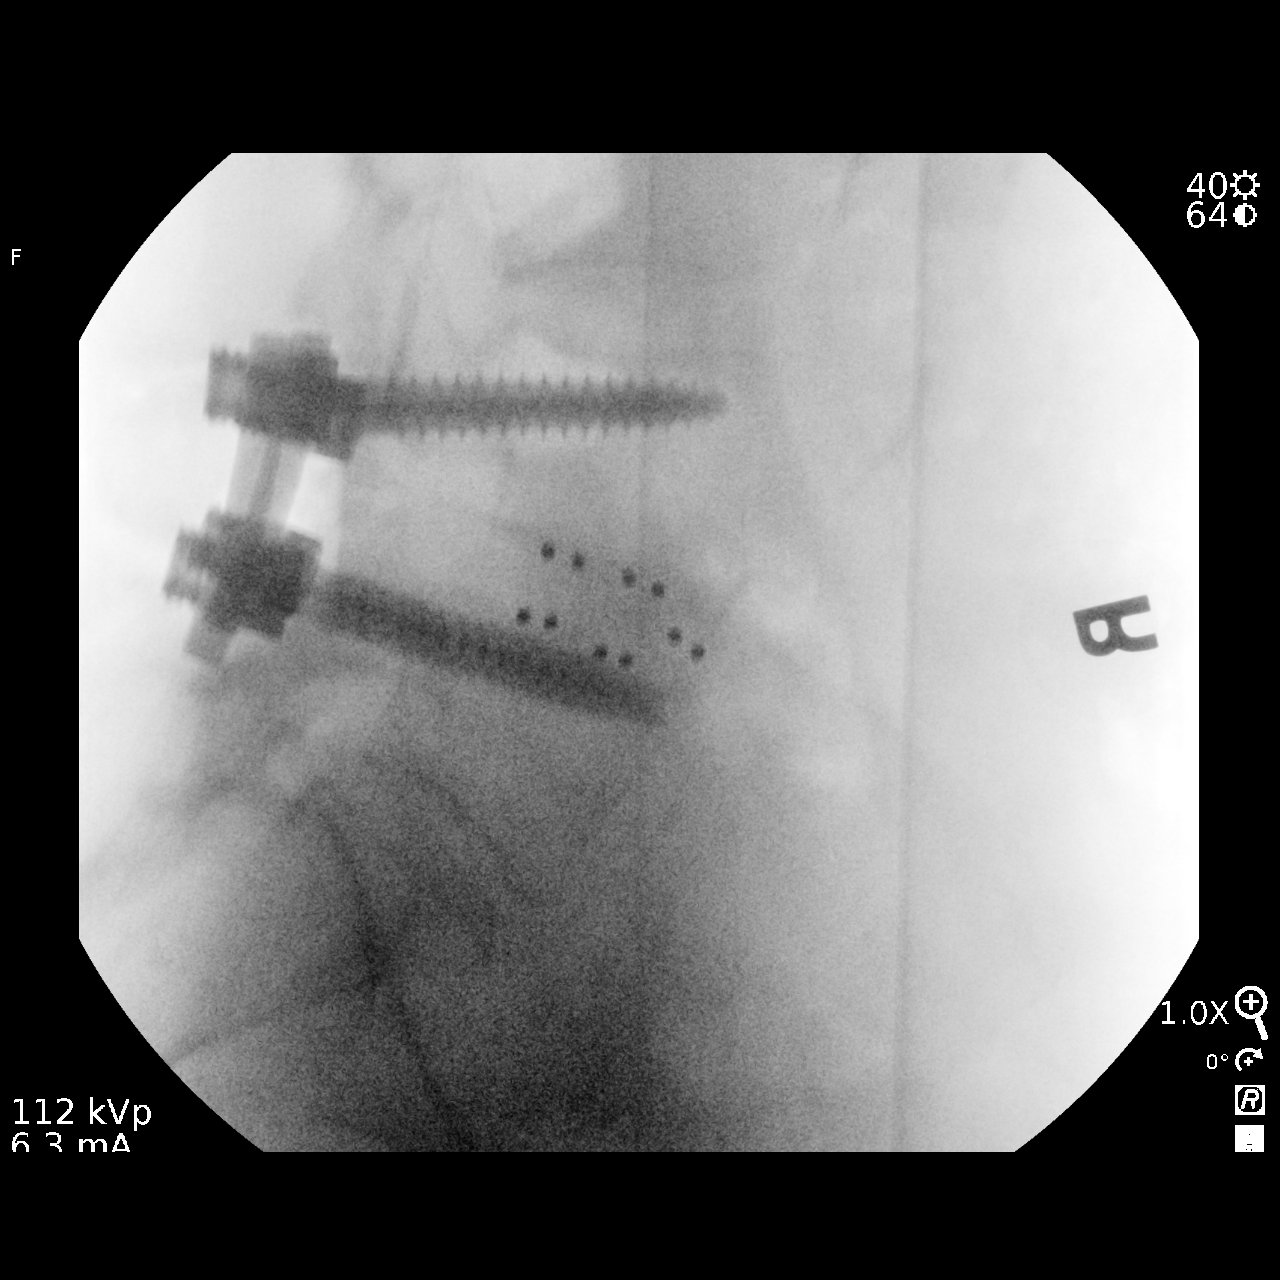

[2 of 2 positions shown; findings below may reference images not displayed]

FINDINGS: Two fluoroscopic images are obtained during the performance of the
procedure and are provided for interpretation only. Images
demonstrate posterior fusion of L4 and L5, with interbody disc
spacer.

Fluoroscopy time: 4 seconds

2.29 mGy
IMPRESSION: Intraoperative fluoroscopy of L4-L5 PLIF.

## 2022-04-26 IMAGING — CR DG LUMBAR SPINE 1V
1 series · 1 of 1 positions shown · non-contrast
Comparison: None Available.

CLINICAL DATA: Localization lumbar fusion

EXAM:
LUMBAR SPINE - 1 VIEW

[lateral]
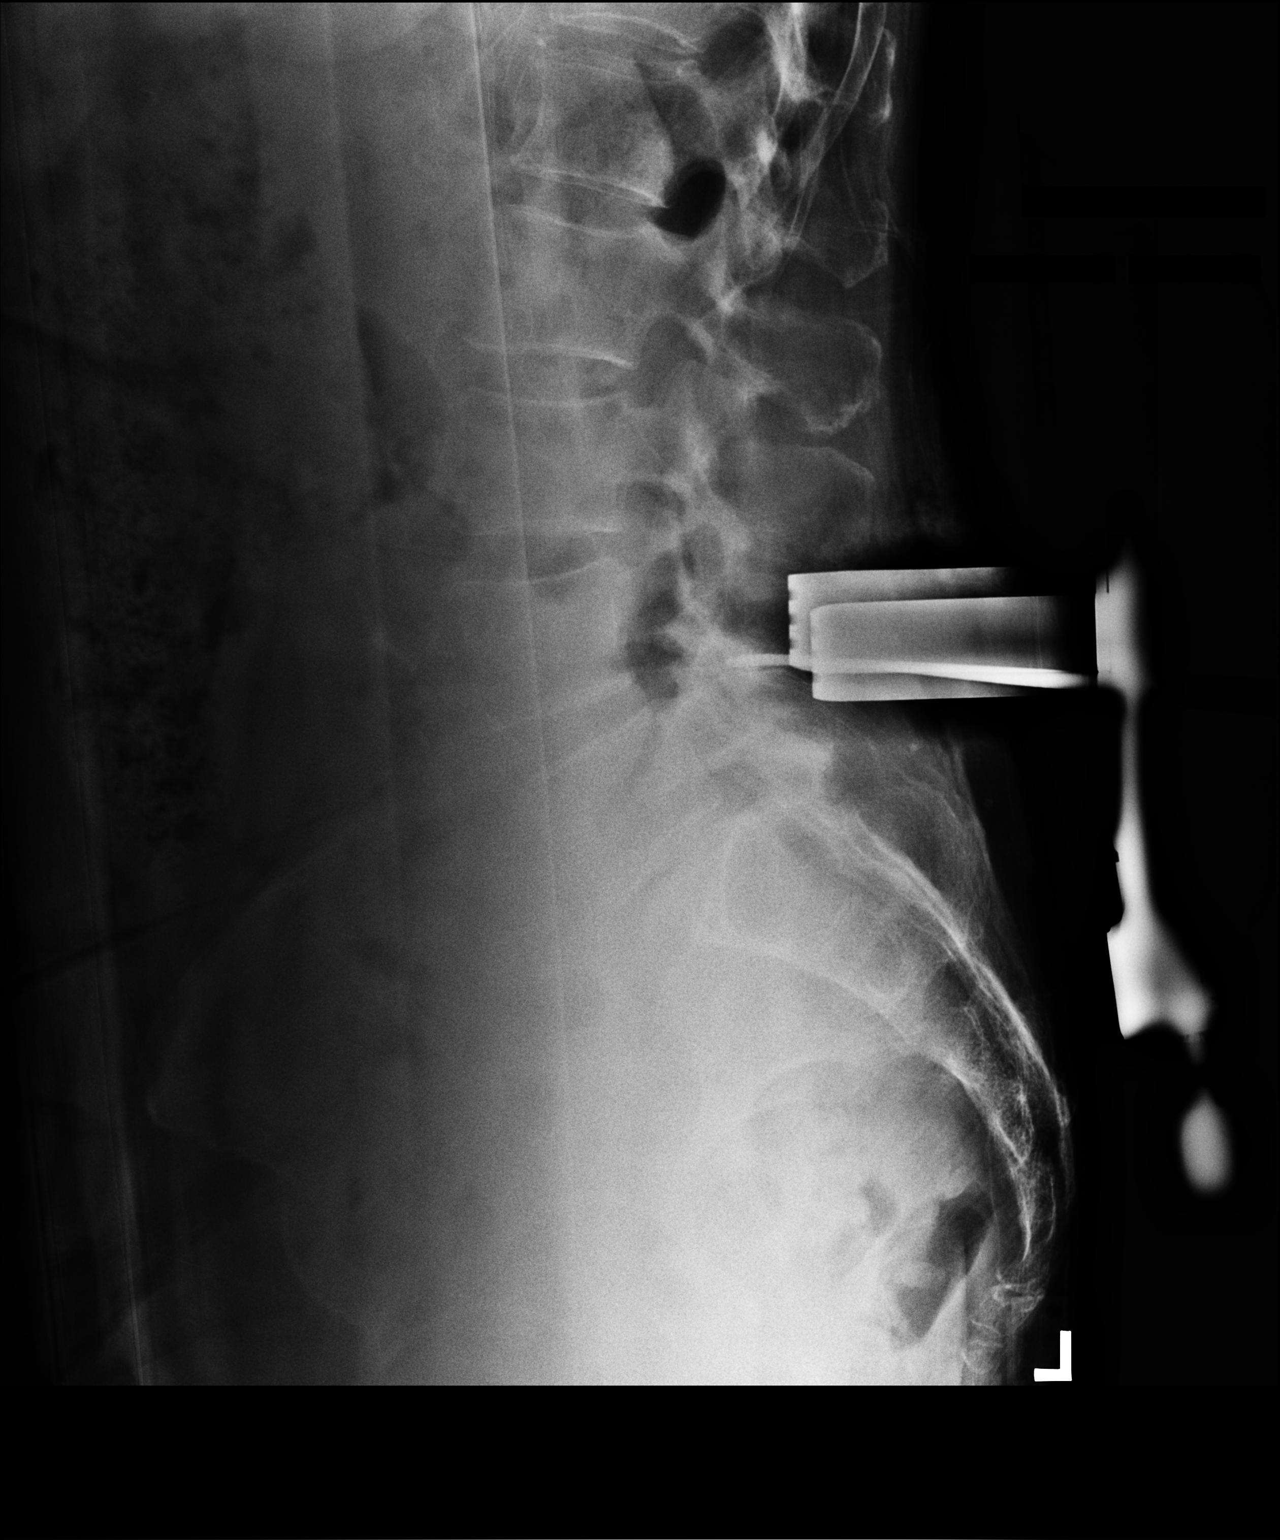

[1 of 1 positions shown; findings below may reference images not displayed]

FINDINGS: Single intraoperative lateral view lumbar spine demonstrates a blunt
tip probe posterior to the L4-L5 disc space. Skin spreaders at same
level.
IMPRESSION: Intraoperative view as above.

## 2022-04-26 SURGERY — POSTERIOR LUMBAR FUSION 1 LEVEL
Anesthesia: General | Site: Spine Lumbar

## 2022-04-26 MED ORDER — ROCURONIUM BROMIDE 10 MG/ML (PF) SYRINGE
PREFILLED_SYRINGE | INTRAVENOUS | Status: DC | PRN
  Administered 2022-04-26: 60 mg via INTRAVENOUS

## 2022-04-26 MED ORDER — CEFAZOLIN SODIUM-DEXTROSE 2-4 GM/100ML-% IV SOLN: 2.0000 g | Freq: Three times a day (TID) | INTRAVENOUS | Status: DC

## 2022-04-26 MED ORDER — ORAL CARE MOUTH RINSE: 15.0000 mL | Freq: Once | OROMUCOSAL | Status: AC

## 2022-04-26 MED ORDER — FENTANYL CITRATE (PF) 250 MCG/5ML IJ SOLN
INTRAMUSCULAR | Status: AC
  Filled 2022-04-26: qty 5

## 2022-04-26 MED ORDER — AMLODIPINE BESYLATE 5 MG PO TABS
10.0000 mg | ORAL_TABLET | Freq: Every day | ORAL | Status: DC
  Administered 2022-04-26 – 2022-04-27 (×2): 10 mg via ORAL
  Filled 2022-04-26 (×2): qty 2

## 2022-04-26 MED ORDER — FENTANYL CITRATE (PF) 100 MCG/2ML IJ SOLN
INTRAMUSCULAR | Status: AC
  Filled 2022-04-26: qty 2

## 2022-04-26 MED ORDER — OXYCODONE HCL 5 MG PO TABS: 5.0000 mg | ORAL_TABLET | Freq: Once | ORAL | Status: DC | PRN

## 2022-04-26 MED ORDER — LACTATED RINGERS IV SOLN: INTRAVENOUS | Status: DC

## 2022-04-26 MED ORDER — SODIUM CHLORIDE 0.9% FLUSH: 3.0000 mL | Freq: Two times a day (BID) | INTRAVENOUS | Status: DC

## 2022-04-26 MED ORDER — BISACODYL 10 MG RE SUPP: 10.0000 mg | Freq: Every day | RECTAL | Status: DC | PRN

## 2022-04-26 MED ORDER — ONDANSETRON HCL 4 MG/2ML IJ SOLN: 4.0000 mg | Freq: Once | INTRAMUSCULAR | Status: DC | PRN

## 2022-04-26 MED ORDER — ONDANSETRON HCL 4 MG/2ML IJ SOLN
INTRAMUSCULAR | Status: DC | PRN
  Administered 2022-04-26: 4 mg via INTRAVENOUS

## 2022-04-26 MED ORDER — BUPIVACAINE HCL (PF) 0.5 % IJ SOLN
INTRAMUSCULAR | Status: DC | PRN
  Administered 2022-04-26: 20 mL

## 2022-04-26 MED ORDER — CEFAZOLIN SODIUM-DEXTROSE 2-4 GM/100ML-% IV SOLN
2.0000 g | INTRAVENOUS | Status: AC
  Administered 2022-04-26: 2 g via INTRAVENOUS
  Filled 2022-04-26: qty 100

## 2022-04-26 MED ORDER — MIDAZOLAM HCL 5 MG/5ML IJ SOLN
INTRAMUSCULAR | Status: DC | PRN
  Administered 2022-04-26: 2 mg via INTRAVENOUS

## 2022-04-26 MED ORDER — POLYETHYLENE GLYCOL 3350 17 G PO PACK: 17.0000 g | PACK | Freq: Every day | ORAL | Status: DC | PRN

## 2022-04-26 MED ORDER — ONDANSETRON HCL 4 MG PO TABS: 4.0000 mg | ORAL_TABLET | Freq: Four times a day (QID) | ORAL | Status: DC | PRN

## 2022-04-26 MED ORDER — SODIUM CHLORIDE 0.9 % IV SOLN: 250.0000 mL | INTRAVENOUS | Status: DC

## 2022-04-26 MED ORDER — METHOCARBAMOL 500 MG PO TABS
500.0000 mg | ORAL_TABLET | Freq: Four times a day (QID) | ORAL | Status: DC | PRN
  Administered 2022-04-26 – 2022-04-27 (×3): 500 mg via ORAL
  Filled 2022-04-26 (×3): qty 1

## 2022-04-26 MED ORDER — LIDOCAINE 2% (20 MG/ML) 5 ML SYRINGE
INTRAMUSCULAR | Status: AC
  Filled 2022-04-26: qty 5

## 2022-04-26 MED ORDER — IRBESARTAN 150 MG PO TABS
300.0000 mg | ORAL_TABLET | Freq: Every day | ORAL | Status: DC
  Administered 2022-04-26 – 2022-04-27 (×2): 300 mg via ORAL
  Filled 2022-04-26 (×2): qty 2

## 2022-04-26 MED ORDER — PHENYLEPHRINE 80 MCG/ML (10ML) SYRINGE FOR IV PUSH (FOR BLOOD PRESSURE SUPPORT)
PREFILLED_SYRINGE | INTRAVENOUS | Status: DC | PRN
  Administered 2022-04-26 (×5): 80 ug via INTRAVENOUS

## 2022-04-26 MED ORDER — ONDANSETRON HCL 4 MG/2ML IJ SOLN
INTRAMUSCULAR | Status: AC
  Filled 2022-04-26: qty 2

## 2022-04-26 MED ORDER — CHLORHEXIDINE GLUCONATE CLOTH 2 % EX PADS: 6.0000 | MEDICATED_PAD | Freq: Once | CUTANEOUS | Status: DC

## 2022-04-26 MED ORDER — FLEET ENEMA 7-19 GM/118ML RE ENEM: 1.0000 | ENEMA | Freq: Once | RECTAL | Status: DC | PRN

## 2022-04-26 MED ORDER — THROMBIN 5000 UNITS EX SOLR
CUTANEOUS | Status: AC
  Filled 2022-04-26: qty 5000

## 2022-04-26 MED ORDER — SENNA 8.6 MG PO TABS
1.0000 | ORAL_TABLET | Freq: Two times a day (BID) | ORAL | Status: DC
  Administered 2022-04-26 – 2022-04-27 (×2): 8.6 mg via ORAL
  Filled 2022-04-26 (×2): qty 1

## 2022-04-26 MED ORDER — MORPHINE SULFATE (PF) 2 MG/ML IV SOLN
2.0000 mg | INTRAVENOUS | Status: DC | PRN
  Administered 2022-04-27: 2 mg via INTRAVENOUS
  Filled 2022-04-26: qty 1

## 2022-04-26 MED ORDER — PHENOL 1.4 % MT LIQD: 1.0000 | OROMUCOSAL | Status: DC | PRN

## 2022-04-26 MED ORDER — CEFAZOLIN SODIUM-DEXTROSE 2-4 GM/100ML-% IV SOLN
2.0000 g | Freq: Two times a day (BID) | INTRAVENOUS | Status: DC
  Administered 2022-04-27: 2 g via INTRAVENOUS
  Filled 2022-04-26: qty 100

## 2022-04-26 MED ORDER — PROPOFOL 1000 MG/100ML IV EMUL
INTRAVENOUS | Status: AC
  Filled 2022-04-26: qty 100

## 2022-04-26 MED ORDER — LIDOCAINE 2% (20 MG/ML) 5 ML SYRINGE
INTRAMUSCULAR | Status: DC | PRN
  Administered 2022-04-26: 60 mg via INTRAVENOUS

## 2022-04-26 MED ORDER — SODIUM CHLORIDE 0.9% FLUSH: 3.0000 mL | INTRAVENOUS | Status: DC | PRN

## 2022-04-26 MED ORDER — HYDRALAZINE HCL 50 MG PO TABS: 100.0000 mg | ORAL_TABLET | Freq: Every morning | ORAL | Status: DC

## 2022-04-26 MED ORDER — SUGAMMADEX SODIUM 200 MG/2ML IV SOLN
INTRAVENOUS | Status: DC | PRN
  Administered 2022-04-26: 175 mg via INTRAVENOUS

## 2022-04-26 MED ORDER — PROPOFOL 10 MG/ML IV BOLUS
INTRAVENOUS | Status: AC
  Filled 2022-04-26: qty 20

## 2022-04-26 MED ORDER — DEXAMETHASONE SODIUM PHOSPHATE 10 MG/ML IJ SOLN
INTRAMUSCULAR | Status: AC
Start: 2022-04-26 — End: ?
  Filled 2022-04-26: qty 1

## 2022-04-26 MED ORDER — KETAMINE HCL 50 MG/5ML IJ SOSY
PREFILLED_SYRINGE | INTRAMUSCULAR | Status: AC
  Filled 2022-04-26: qty 5

## 2022-04-26 MED ORDER — ACETAMINOPHEN 500 MG PO TABS
1000.0000 mg | ORAL_TABLET | Freq: Once | ORAL | Status: AC
  Administered 2022-04-26: 1000 mg via ORAL
  Filled 2022-04-26: qty 2

## 2022-04-26 MED ORDER — FENTANYL CITRATE (PF) 100 MCG/2ML IJ SOLN
25.0000 ug | INTRAMUSCULAR | Status: DC | PRN
  Administered 2022-04-26 (×2): 25 ug via INTRAVENOUS
  Administered 2022-04-26: 50 ug via INTRAVENOUS

## 2022-04-26 MED ORDER — BUPIVACAINE HCL (PF) 0.5 % IJ SOLN
INTRAMUSCULAR | Status: AC
  Filled 2022-04-26: qty 30

## 2022-04-26 MED ORDER — FENTANYL CITRATE (PF) 250 MCG/5ML IJ SOLN
INTRAMUSCULAR | Status: DC | PRN
  Administered 2022-04-26: 100 ug via INTRAVENOUS
  Administered 2022-04-26: 50 ug via INTRAVENOUS

## 2022-04-26 MED ORDER — ONDANSETRON HCL 4 MG/2ML IJ SOLN: 4.0000 mg | Freq: Four times a day (QID) | INTRAMUSCULAR | Status: DC | PRN

## 2022-04-26 MED ORDER — EZETIMIBE 10 MG PO TABS
10.0000 mg | ORAL_TABLET | Freq: Every day | ORAL | Status: DC
  Administered 2022-04-27: 10 mg via ORAL
  Filled 2022-04-26: qty 1

## 2022-04-26 MED ORDER — HYDROCODONE-ACETAMINOPHEN 10-325 MG PO TABS
1.0000 | ORAL_TABLET | ORAL | Status: DC | PRN
  Administered 2022-04-26 (×2): 1 via ORAL
  Filled 2022-04-26 (×2): qty 1

## 2022-04-26 MED ORDER — PROPOFOL 10 MG/ML IV BOLUS
INTRAVENOUS | Status: DC | PRN
  Administered 2022-04-26: 100 mg via INTRAVENOUS

## 2022-04-26 MED ORDER — THROMBIN 5000 UNITS EX SOLR: OROMUCOSAL | Status: DC | PRN

## 2022-04-26 MED ORDER — PROPOFOL 500 MG/50ML IV EMUL
INTRAVENOUS | Status: DC | PRN
  Administered 2022-04-26: 25 ug/kg/min via INTRAVENOUS

## 2022-04-26 MED ORDER — ALPRAZOLAM 0.5 MG PO TABS
0.5000 mg | ORAL_TABLET | Freq: Every evening | ORAL | Status: DC | PRN
Start: 2022-04-26 — End: 2022-04-27
  Administered 2022-04-26: 0.5 mg via ORAL
  Filled 2022-04-26: qty 1

## 2022-04-26 MED ORDER — LIDOCAINE-EPINEPHRINE 1 %-1:100000 IJ SOLN
INTRAMUSCULAR | Status: AC
  Filled 2022-04-26: qty 1

## 2022-04-26 MED ORDER — ACETAMINOPHEN 325 MG PO TABS: 650.0000 mg | ORAL_TABLET | ORAL | Status: DC | PRN

## 2022-04-26 MED ORDER — MENTHOL 3 MG MT LOZG
1.0000 | LOZENGE | OROMUCOSAL | Status: DC | PRN
  Filled 2022-04-26: qty 9

## 2022-04-26 MED ORDER — METHOCARBAMOL 1000 MG/10ML IJ SOLN: 500.0000 mg | Freq: Four times a day (QID) | INTRAVENOUS | Status: DC | PRN

## 2022-04-26 MED ORDER — AMISULPRIDE (ANTIEMETIC) 5 MG/2ML IV SOLN
INTRAVENOUS | Status: AC
  Filled 2022-04-26: qty 4

## 2022-04-26 MED ORDER — ZOLPIDEM TARTRATE 5 MG PO TABS: 5.0000 mg | ORAL_TABLET | Freq: Every evening | ORAL | Status: DC | PRN

## 2022-04-26 MED ORDER — ACETAMINOPHEN 650 MG RE SUPP: 650.0000 mg | RECTAL | Status: DC | PRN

## 2022-04-26 MED ORDER — DEXAMETHASONE SODIUM PHOSPHATE 10 MG/ML IJ SOLN
INTRAMUSCULAR | Status: DC | PRN
  Administered 2022-04-26: 10 mg via INTRAVENOUS

## 2022-04-26 MED ORDER — OXYCODONE HCL 5 MG/5ML PO SOLN: 5.0000 mg | Freq: Once | ORAL | Status: DC | PRN

## 2022-04-26 MED ORDER — CEFAZOLIN SODIUM-DEXTROSE 2-4 GM/100ML-% IV SOLN: 2.0000 g | Freq: Two times a day (BID) | INTRAVENOUS | Status: DC

## 2022-04-26 MED ORDER — MIDAZOLAM HCL 2 MG/2ML IJ SOLN
INTRAMUSCULAR | Status: AC
  Filled 2022-04-26: qty 2

## 2022-04-26 MED ORDER — DOCUSATE SODIUM 100 MG PO CAPS
100.0000 mg | ORAL_CAPSULE | Freq: Two times a day (BID) | ORAL | Status: DC
  Administered 2022-04-26 – 2022-04-27 (×2): 100 mg via ORAL
  Filled 2022-04-26 (×2): qty 1

## 2022-04-26 MED ORDER — LIDOCAINE-EPINEPHRINE 1 %-1:100000 IJ SOLN
INTRAMUSCULAR | Status: DC | PRN
  Administered 2022-04-26: 10 mL

## 2022-04-26 MED ORDER — KETAMINE HCL-SODIUM CHLORIDE 100-0.9 MG/10ML-% IV SOSY
PREFILLED_SYRINGE | INTRAVENOUS | Status: DC | PRN
  Administered 2022-04-26: 20 mg via INTRAVENOUS

## 2022-04-26 MED ORDER — AMISULPRIDE (ANTIEMETIC) 5 MG/2ML IV SOLN
10.0000 mg | Freq: Once | INTRAVENOUS | Status: DC
  Administered 2022-04-26: 10 mg via INTRAVENOUS

## 2022-04-26 MED ORDER — CHLORHEXIDINE GLUCONATE 0.12 % MT SOLN
15.0000 mL | Freq: Once | OROMUCOSAL | Status: AC
  Administered 2022-04-26: 15 mL via OROMUCOSAL
  Filled 2022-04-26: qty 15

## 2022-04-26 MED ORDER — MECLIZINE HCL 12.5 MG PO TABS
12.5000 mg | ORAL_TABLET | Freq: Once | ORAL | Status: AC
  Administered 2022-04-26: 12.5 mg via ORAL
  Filled 2022-04-26: qty 1

## 2022-04-26 SURGICAL SUPPLY — 72 items
ADH SKN CLS APL DERMABOND .7 (GAUZE/BANDAGES/DRESSINGS) ×1
APL SRG 60D 8 XTD TIP BNDBL (TIP)
BAG COUNTER SPONGE SURGICOUNT (BAG) ×2 IMPLANT
BAG SPNG CNTER NS LX DISP (BAG) ×1
BASKET BONE COLLECTION (BASKET) ×2 IMPLANT
BLADE BONE MILL MEDIUM (MISCELLANEOUS) ×2 IMPLANT
BLADE CLIPPER SURG (BLADE) IMPLANT
BONE CANC CHIPS 20CC PCAN1/4 (Bone Implant) ×2 IMPLANT
BUR MATCHSTICK NEURO 3.0 LAGG (BURR) ×2 IMPLANT
CAGE COROENT LG 10X9X23-12 (Cage) ×2 IMPLANT
CANISTER SUCT 3000ML PPV (MISCELLANEOUS) ×2 IMPLANT
CHIPS CANC BONE 20CC PCAN1/4 (Bone Implant) ×1 IMPLANT
CNTNR URN SCR LID CUP LEK RST (MISCELLANEOUS) ×1 IMPLANT
CONT SPEC 4OZ STRL OR WHT (MISCELLANEOUS) ×2
COVER BACK TABLE 60X90IN (DRAPES) ×2 IMPLANT
DERMABOND ADVANCED (GAUZE/BANDAGES/DRESSINGS) ×1
DERMABOND ADVANCED .7 DNX12 (GAUZE/BANDAGES/DRESSINGS) ×1 IMPLANT
DEVICE DISSECT PLASMABLAD 3.0S (MISCELLANEOUS) ×1 IMPLANT
DRAPE C-ARM 42X72 X-RAY (DRAPES) ×4 IMPLANT
DRAPE HALF SHEET 40X57 (DRAPES) ×1 IMPLANT
DRAPE LAPAROTOMY 100X72X124 (DRAPES) ×2 IMPLANT
DRSG OPSITE POSTOP 4X6 (GAUZE/BANDAGES/DRESSINGS) ×1 IMPLANT
DURAPREP 26ML APPLICATOR (WOUND CARE) ×2 IMPLANT
DURASEAL APPLICATOR TIP (TIP) IMPLANT
DURASEAL SPINE SEALANT 3ML (MISCELLANEOUS) IMPLANT
ELECT REM PT RETURN 9FT ADLT (ELECTROSURGICAL) ×2
ELECTRODE REM PT RTRN 9FT ADLT (ELECTROSURGICAL) ×1 IMPLANT
GAUZE 4X4 16PLY ~~LOC~~+RFID DBL (SPONGE) IMPLANT
GAUZE SPONGE 4X4 12PLY STRL (GAUZE/BANDAGES/DRESSINGS) ×2 IMPLANT
GLOVE BIOGEL PI IND STRL 6.5 (GLOVE) IMPLANT
GLOVE BIOGEL PI IND STRL 8 (GLOVE) IMPLANT
GLOVE BIOGEL PI IND STRL 8.5 (GLOVE) ×2 IMPLANT
GLOVE BIOGEL PI INDICATOR 6.5 (GLOVE) ×3
GLOVE BIOGEL PI INDICATOR 8 (GLOVE) ×4
GLOVE BIOGEL PI INDICATOR 8.5 (GLOVE) ×2
GLOVE ECLIPSE 8.5 STRL (GLOVE) ×2 IMPLANT
GLOVE SURG SS PI 8.0 STRL IVOR (GLOVE) ×1 IMPLANT
GOWN STRL REUS W/ TWL LRG LVL3 (GOWN DISPOSABLE) IMPLANT
GOWN STRL REUS W/ TWL XL LVL3 (GOWN DISPOSABLE) IMPLANT
GOWN STRL REUS W/TWL 2XL LVL3 (GOWN DISPOSABLE) ×3 IMPLANT
GOWN STRL REUS W/TWL LRG LVL3 (GOWN DISPOSABLE) ×2
GOWN STRL REUS W/TWL XL LVL3 (GOWN DISPOSABLE) ×2
GRAFT BNE CANC CHIPS 1-8 20CC (Bone Implant) IMPLANT
GRAFT BONE PROTEIOS LRG 5CC (Orthopedic Implant) ×1 IMPLANT
HEMOSTAT POWDER KIT SURGIFOAM (HEMOSTASIS) ×1 IMPLANT
KIT BASIN OR (CUSTOM PROCEDURE TRAY) ×2 IMPLANT
KIT GRAFTMAG DEL NEURO DISP (NEUROSURGERY SUPPLIES) IMPLANT
KIT TURNOVER KIT B (KITS) ×2 IMPLANT
MILL BONE PREP (MISCELLANEOUS) ×2 IMPLANT
NEEDLE HYPO 22GX1.5 SAFETY (NEEDLE) ×2 IMPLANT
NS IRRIG 1000ML POUR BTL (IV SOLUTION) ×2 IMPLANT
PACK LAMINECTOMY NEURO (CUSTOM PROCEDURE TRAY) ×2 IMPLANT
PAD ARMBOARD 7.5X6 YLW CONV (MISCELLANEOUS) ×9 IMPLANT
PATTIES SURGICAL .5 X1 (DISPOSABLE) ×2 IMPLANT
PLASMABLADE 3.0S (MISCELLANEOUS) ×2
ROD RELINE-O LORD 5.5X40 (Rod) ×2 IMPLANT
SCREW LOCK RELINE 5.5 TULIP (Screw) ×4 IMPLANT
SCREW RELINE-O POLY 6.5X50MM (Screw) ×4 IMPLANT
SPIKE FLUID TRANSFER (MISCELLANEOUS) ×2 IMPLANT
SPONGE SURGIFOAM ABS GEL 100 (HEMOSTASIS) ×2 IMPLANT
SPONGE T-LAP 4X18 ~~LOC~~+RFID (SPONGE) IMPLANT
SUT PROLENE 6 0 BV (SUTURE) IMPLANT
SUT VIC AB 1 CT1 18XBRD ANBCTR (SUTURE) ×1 IMPLANT
SUT VIC AB 1 CT1 8-18 (SUTURE) ×2
SUT VIC AB 2-0 CP2 18 (SUTURE) ×2 IMPLANT
SUT VIC AB 3-0 SH 8-18 (SUTURE) ×2 IMPLANT
SUT VIC AB 4-0 RB1 18 (SUTURE) ×3 IMPLANT
SYR 3ML LL SCALE MARK (SYRINGE) ×8 IMPLANT
TOWEL GREEN STERILE (TOWEL DISPOSABLE) ×2 IMPLANT
TOWEL GREEN STERILE FF (TOWEL DISPOSABLE) ×2 IMPLANT
TRAY FOLEY MTR SLVR 16FR STAT (SET/KITS/TRAYS/PACK) ×2 IMPLANT
WATER STERILE IRR 1000ML POUR (IV SOLUTION) ×2 IMPLANT

## 2022-04-26 NOTE — Progress Notes (Signed)
Orthopedic Tech Progress Note Patient Details:  Robin Arellano 12-30-1950 [id number]  Ortho Devices Type of Ortho Device: Lumbar corsett Ortho Device/Splint Interventions: Ordered     Brace dropped off with patient in PACU.  Vernona Rieger 04/26/2022, 5:02 PM

## 2022-04-26 NOTE — Anesthesia Postprocedure Evaluation (Signed)
Anesthesia Post Note  Patient: Robin Arellano  Procedure(s) Performed: Lumbar four -five Posterior Lumbar Interbody Fusion (Spine Lumbar)     Patient location during evaluation: PACU Anesthesia Type: General Level of consciousness: awake and alert Pain management: pain level controlled Vital Signs Assessment: post-procedure vital signs reviewed and stable Respiratory status: spontaneous breathing, nonlabored ventilation and respiratory function stable Cardiovascular status: stable and blood pressure returned to baseline Anesthetic complications: no   No notable events documented.  Last Vitals:  Vitals:   04/26/22 1700 04/26/22 1722  BP: (!) 164/72 (!) 183/73  Pulse: 75 79  Resp: 13 18  Temp:  36.7 C  SpO2: 95% 97%    Last Pain:  Vitals:   04/26/22 1722  TempSrc: Oral  PainSc: [place]

## 2022-04-26 NOTE — Anesthesia Procedure Notes (Signed)
Procedure Name: Intubation Date/Time: 04/26/2022 1:15 PM  Performed by: Jenne Campus, CRNAPre-anesthesia Checklist: Patient identified, Emergency Drugs available, Suction available and Patient being monitored Patient Re-evaluated:Patient Re-evaluated prior to induction Oxygen Delivery Method: Circle System Utilized Preoxygenation: Pre-oxygenation with 100% oxygen Induction Type: IV induction Ventilation: Mask ventilation without difficulty and Oral airway inserted - appropriate to patient size Laryngoscope Size: Sabra Heck and 2 Grade View: Grade I Tube type: Oral Tube size: 7.0 mm Number of attempts: 1 Airway Equipment and Method: Stylet and Oral airway Placement Confirmation: ETT inserted through vocal cords under direct vision, positive ETCO2 and breath sounds checked- equal and bilateral Secured at: 21 cm Tube secured with: Tape Dental Injury: Teeth and Oropharynx as per pre-operative assessment

## 2022-04-26 NOTE — Transfer of Care (Signed)
Immediate Anesthesia Transfer of Care Note  Patient: Robin Arellano  Procedure(s) Performed: Lumbar four -five Posterior Lumbar Interbody Fusion (Spine Lumbar)  Patient Location: PACU  Anesthesia Type:General  Level of Consciousness: oriented, drowsy and patient cooperative  Airway & Oxygen Therapy: Patient Spontanous Breathing and Patient connected to nasal cannula oxygen  Post-op Assessment: Report given to RN and Post -op Vital signs reviewed and stable  Post vital signs: Reviewed  Last Vitals:  Vitals Value Taken Time  BP 172/67 04/26/22 1616  Temp    Pulse 81 04/26/22 1617  Resp 23 04/26/22 1617  SpO2 99 % 04/26/22 1617  Vitals shown include unvalidated device data.  Last Pain:  Vitals:   04/26/22 1031  TempSrc:   PainSc: 5       Patients Stated Pain Goal: 2 (55/97/41 6384)  Complications: No notable events documented.

## 2022-04-26 NOTE — Op Note (Signed)
Date of surgery: 04/26/2022 Preoperative diagnosis: Spondylolisthesis L4-L5 with L4 radiculopathy bilaterally Postoperative diagnosis: Same Procedure: Bilateral laminectomy L4-L5 decompression of L4 and L5 nerve roots with more work than required for simple interbody technique.  Posterior lumbar interbody arthrodesis with peek spacers local autograft allograft and Proteus pedicle screw fixation L4-L5 with posterolateral arthrodesis with local autograft allograft and Proteus fluoroscopic guidance. Surgeon: Kristeen Miss First Assistant: Elwin Sleight, DO Anesthesia: General endotracheal Indications: Robin Arellano is a 71 year old individuals had back and bilateral lower extremity pain secondary to a degenerative spondylolisthesis at the L4-L5 level.  She is failed all manner of conservative treatment and after careful consideration I advised surgical decompression and stabilization at L4-L5.  She is now admitted for that procedure.  Procedure: Patient was brought to the operating room supine on the stretcher.  After the smooth induction of general endotracheal anesthesia she was carefully turned prone.  The back was prepped with alcohol DuraPrep and draped in a sterile fashion.  Midline incision was created and carried down to the lumbodorsal fascia which was opened on either side of midline to expose the first spinous processes which were noted to be that of L4 and L5.  The laminar space was then identified positively and the dissection was carried out over the interlaminar space to expose and isolate the L4-5 facet joint.  Superiorly the dissection was carried out to expose the transverse process of L4 and the pedicle entry site on the lateral aspect of the facet joint at L3-4.  With the lateral gutters being exposed and decorticated the area was packed off for later use and grafting.  Then a laminectomy was created removing the inferior margin lamina of L4 out to and including the entirety of the facet at  L4-L5.  The thickened redundant yellow ligament was then carefully taken up and this exposed the common dural tube and the takeoff of the L5 nerve root inferiorly and the takeoff of the L4 nerve root superiorly these areas were decompressed to the epidural veins which bled profusely and were cauterized and divided.  The disc space was then isolated once both sides were decompressed a discectomy was performed using a #15 blade to open the disc space and a combination of curettes and rongeurs in addition to Kerrison punches to remove a substantial quantity of severely degenerated desiccated disc material from within the disc space.  A self-retaining disc spreader was used on 1 side while the disc was opened further and completely decorticated of its contents.  The ventral aspect of the anterior longitudinal ligament was reached but this was not breached.  The endplates were curettaged to remove any endplate material.  Once the endplates were completely prepared the interspace was sized for an appropriate size spacer and it was felt that a 10 x 9 x 23 mm spacer with 12 degrees lordosis would fit best into this interval this was filled with a combination of autograft allograft and Proteus along with 16 cc of bone graft that was packed into the interspace with the 2 cages.  Then pedicle entry sites were chosen at L4 and L5 and confirmed with fluoroscopic guidance and 6.5 x 50 mm screws were placed in L4 and L5.  Short thick 40 mm precontoured rods measuring 5.5 in diameter were used to complete the construct between L4 and L5.  To this however the lateral gutters which were packed off were then exposed again by removing the packing and placing a total of 9 cc of bone graft  into each lateral gutter.  Once the bone graft was packed in and the pedicles screws were tightened into a neutral construct final radiographs were obtained in AP and lateral projection.  Care was taken to make sure that the common dural tube the L4  and L5 nerve roots were all well decompressed and hemostasis was achieved well around them then a total of 25 cc of half percent Marcaine was injected into the paraspinous fascia and the fascia and the lumbodorsal spine was closed with #1 Vicryl in interrupted fashion 2 oh Fetcroja was used in the subcutaneous tissues 3-0 Vicryl subcuticularly and Dermabond was used on the skin blood loss for the procedure was estimated 200 cc.  Patient was returned to recovery room in stable condition.

## 2022-04-26 NOTE — H&P (Signed)
Robin Arellano is an 71 y.o. female.   Chief Complaint: Back and bilateral leg pain, weakness HPI: Patient is a 70 year old individual whose had significant problems with chronic back pain.  She was noted to have a degenerative spondylolisthesis at the level of L4-L5 that was fairly mild and showed no evidence of neural compromise.  Myelogram was completed in December which demonstrated that there was some mild lateral recess stenosis at L4-L5 despite efforts at conservative treatment patient's pain continue to worsen and ultimately an EMG nerve conduction study showed that she has some active denervation because of this process it was advised that she undergo surgical decompression at the L4-L5 level more recent flexion-extension films demonstrate approximately 4 mm of motion between flexion and extension at the L4-L5 level.  There is also some degenerative changes at L5-S1 but has been advised only L4-L5 be decompressed and stabilized.  Past Medical History:  Diagnosis Date   Anxiety    on meds   Back pain    Chronic female pelvic pain    Coronary artery disease    mild, non-obstructive 11/2018   Depression    on meds   Family history of adverse reaction to anesthesia    sister had difficulty waking up   Fibromyalgia    H/O leukocytosis    Headache    Hyperlipidemia    on meds   Hypertension    on meds   MI (myocardial infarction) ([place])    Pt states she did not have a MI- EKG was normal, was GERD   Osteoarthritis    on meds   Ovarian cyst, right    PONV (postoperative nausea and vomiting)    Post-operative nausea and vomiting    SVD (spontaneous vaginal delivery)    x 2   Vitamin D deficiency     Past Surgical History:  Procedure Laterality Date   ABDOMINAL HYSTERECTOMY  1994   TAH.BSO   ANTERIOR CERVICAL DECOMP/DISCECTOMY FUSION  2019   APPENDECTOMY  1975   BACK SURGERY     BIOPSY  05/12/2020   Procedure: BIOPSY;  Surgeon: Milus Banister, MD;  Location: [facility]  END[facility]COPY;  Service: Endoscopy;;   CARDIAC CATHETERIZATION  2020   CHOLECYSTECTOMY N/A 07/21/2020   Procedure: LAPAROSCOPIC CHOLECYSTECTOMY WITH INTRAOPERATIVE CHOLANGIOGRAM;  Surgeon: [name], MD;  Location: [facility];  Service: General;  Laterality: N/A;   COLONOSCOPY  08/12/2017   Hx TA (piecemeal)Jacobs-MAC-suprep (good)   ESOPHAGOGASTRODUODENOSCOPY (EGD) WITH PROPOFOL N/A 05/12/2020   Procedure: ESOPHAGOGASTRODUODENOSCOPY (EGD) WITH PROPOFOL;  Surgeon: Milus Banister, MD;  Location: [facility];  Service: Endoscopy;  Laterality: N/A;   EUS N/A 05/12/2020   Procedure: UPPER ENDOSCOPIC ULTRASOUND (EUS) RADIAL;  Surgeon: Milus Banister, MD;  Location: [facility];  Service: Endoscopy;  Laterality: N/A;   KNEE SURGERY Bilateral 1996   x 2 - arthroscopic   LEFT HEART CATH AND CORONARY ANGIOGRAPHY N/A 11/25/2018   Procedure: LEFT HEART CATH AND CORONARY ANGIOGRAPHY;  Surgeon: Troy Sine, MD;  Location: [facility];  Service: Cardiovascular;  Laterality: N/A;   PELVIC LAPAROSCOPY  1989   W LYSIS OF ADHESIONS/L SALPINGONEOSTOMY   TUBAL LIGATION     WISDOM TOOTH EXTRACTION      Family History  Problem Relation Age of Onset   Uterine cancer Mother 3   Aneurysm Father    Heart disease Paternal Grandfather    COPD Brother    Skin cancer Brother    Other Sister  MGUS    Skin cancer Brother    Other Sister        MA   Colon cancer Neg Hx    Rectal cancer Neg Hx    Stomach cancer Neg Hx    Stroke Neg Hx    Neuropathy Neg Hx    Colon polyps Neg Hx    Esophageal cancer Neg Hx    Social History:  reports that she quit smoking about 23 years ago. Her smoking use included cigarettes. She has a 3.00 pack-year smoking history. She has never used smokeless tobacco. She reports that she does not currently use alcohol. She reports that she does not use drugs.  Allergies:  Allergies  Allergen Reactions   Flexeril [Cyclobenzaprine] Anaphylaxis, Hives, Itching and Swelling    Zanaflex [Tizanidine] Anaphylaxis, Hives, Itching and Swelling   Latex Rash    No medications prior to admission.    No results found for this or any previous visit (from the past 48 hour(s)). No results found.  Review of Systems  Constitutional:  Positive for activity change.  HENT: Negative.    Eyes: Negative.   Respiratory: Negative.    Cardiovascular: Negative.   Gastrointestinal: Negative.   Endocrine: Negative.   Genitourinary: Negative.   Musculoskeletal:  Positive for back pain and gait problem.  Skin: Negative.   Allergic/Immunologic: Negative.   Psychiatric/Behavioral: Negative.      There were no vitals taken for this visit. Physical Exam Constitutional:      Appearance: Normal appearance. She is normal weight.  HENT:     Head: Normocephalic and atraumatic.     Nose: Nose normal.     Mouth/Throat:     Mouth: Mucous membranes are moist.     Pharynx: Oropharynx is clear.  Eyes:     Extraocular Movements: Extraocular movements intact.     Conjunctiva/sclera: Conjunctivae normal.     Pupils: Pupils are equal, round, and reactive to light.  Cardiovascular:     Rate and Rhythm: Normal rate and regular rhythm.     Pulses: Normal pulses.     Heart sounds: Normal heart sounds.  Pulmonary:     Effort: Pulmonary effort is normal.     Breath sounds: Normal breath sounds.  Abdominal:     General: Abdomen is flat. Bowel sounds are normal.     Palpations: Abdomen is soft.  Musculoskeletal:        General: Normal range of motion.  Skin:    General: Skin is warm and dry.  Neurological:     Mental Status: She is alert.     Comments: Alert and oriented cranial nerve examination is normal uppers extremity strength and reflexes are normal lower extremity strength reveals some mild weakness in tibialis anterior mild weakness in the right quadriceps and absent reflexes in the patella and the Achilles.  Leg raising is positive at 40 degrees in the left leg 30 degrees in  the right leg.  Patrick's maneuver is negative bilaterally sensation is intact.  Station and gait are intact.  Psychiatric:        Mood and Affect: Mood normal.        Behavior: Behavior normal.        Thought Content: Thought content normal.        Judgment: Judgment normal.      Assessment/Plan Spondylolisthesis L4-L5 with radiculopathy.  Plan: Decompression fusion L4-L5 posterior interbody technique  Earleen Newport, MD 04/26/2022, 7:50 AM

## 2022-04-26 NOTE — Interval H&P Note (Signed)
History and Physical Interval Note:  04/26/2022 12:51 PM  Robin Arellano  has presented today for surgery, with the diagnosis of Spondylolisthesis.  The various methods of treatment have been discussed with the patient and family. After consideration of risks, benefits and other options for treatment, the patient has consented to  Procedure(s) with comments: L4-5 PLIF (N/A) - 3C/RM 20 (to follow) as a surgical intervention.  The patient's history has been reviewed, patient examined, no change in status, stable for surgery.  I have reviewed the patient's chart and labs.  Questions were answered to the patient's satisfaction.     Earleen Newport

## 2022-04-26 NOTE — Telephone Encounter (Signed)
   Pre-operative Risk Assessment    Patient Name: Robin Arellano  DOB: 01-22-1951 MRN: [id number]      Request for Surgical Clearance    Procedure:   L4-5 Posterior lumbar interbody infusion  Date of Surgery:  Clearance 04/26/22                                 Surgeon:  Dr. Kristeen Miss Surgeon's Group or Practice Name:  [facility] Phone number:  [phone] Fax number:  [phone]   Type of Clearance Requested:   - Medical    Type of Anesthesia:  General    Additional requests/questions:    SignedWonda Horner   04/26/2022, 9:40 AM

## 2022-04-27 DIAGNOSIS — M4316 Spondylolisthesis, lumbar region: Secondary | ICD-10-CM | POA: Diagnosis not present

## 2022-04-27 LAB — BASIC METABOLIC PANEL
Anion gap: 12 (ref 5–15)
BUN: 20 mg/dL (ref 8–23)
CO2: 23 mmol/L (ref 22–32)
Calcium: 8.9 mg/dL (ref 8.9–10.3)
Chloride: 103 mmol/L (ref 98–111)
Creatinine, Ser: 1.4 mg/dL — ABNORMAL HIGH (ref 0.44–1.00)
GFR, Estimated: 40 mL/min — ABNORMAL LOW (ref >60)
Glucose, Bld: 134 mg/dL — ABNORMAL HIGH (ref 70–99)
Potassium: 4.4 mmol/L (ref 3.5–5.1)
Sodium: 138 mmol/L (ref 135–145)

## 2022-04-27 LAB — CBC
HCT: 30.3 % — ABNORMAL LOW (ref 36.0–46.0)
Hemoglobin: 10.2 g/dL — ABNORMAL LOW (ref 12.0–15.0)
MCH: 32.1 pg (ref 26.0–34.0)
MCHC: 33.7 g/dL (ref 30.0–36.0)
MCV: 95.3 fL (ref 80.0–100.0)
Platelets: 309 10*3/uL (ref 150–400)
RBC: 3.18 MIL/uL — ABNORMAL LOW (ref 3.87–5.11)
RDW: 13.2 % (ref 11.5–15.5)
WBC: 16.6 10*3/uL — ABNORMAL HIGH (ref 4.0–10.5)
nRBC: 0 % (ref 0.0–0.2)

## 2022-04-27 MED ORDER — OXYCODONE-ACETAMINOPHEN 5-325 MG PO TABS: 1.0000 | ORAL_TABLET | ORAL | 0 refills | Status: DC | PRN

## 2022-04-27 MED ORDER — METHOCARBAMOL 500 MG PO TABS: 500.0000 mg | ORAL_TABLET | Freq: Four times a day (QID) | ORAL | 3 refills | Status: DC | PRN

## 2022-04-27 MED ORDER — OXYCODONE-ACETAMINOPHEN 5-325 MG PO TABS
1.0000 | ORAL_TABLET | ORAL | Status: DC | PRN
  Administered 2022-04-27 (×3): 2 via ORAL
  Filled 2022-04-27 (×3): qty 2

## 2022-04-27 NOTE — Discharge Summary (Signed)
Physician Discharge Summary  Patient ID: Robin Arellano MRN: [id number] DOB/AGE: 06-14-1951 71 y.o.  Admit date: 04/26/2022 Discharge date: 04/27/2022  Admission Diagnoses: Spondylolisthesis L4-L5 with lumbar radiculopathy  Discharge Diagnoses: Spondylolisthesis L4-L5 with lumbar radiculopathy Principal Problem:   Spondylolisthesis at L4-L5 level   Discharged Condition: good  Hospital Course: Patient was admitted to undergo surgical decompression at L4-L5 which she tolerated well she is ambulatory incision is clean and dry.  Consults: None  Significant Diagnostic Studies: None  Treatments: surgery: See op note  Discharge Exam: Blood pressure (!) 161/68, pulse 72, temperature 97.7 F (36.5 C), temperature source Oral, resp. rate 16, height '5\' 3"'$  (1.6 m), weight 68.9 kg, SpO2 98 %. Incision is clean and dry motor function is intact  Disposition: Discharge disposition: 01-Home or Self Care       Discharge Instructions     Call MD for:  redness, tenderness, or signs of infection (pain, swelling, redness, odor or green/yellow discharge around incision site)   Complete by: As directed    Call MD for:  severe uncontrolled pain   Complete by: As directed    Call MD for:  temperature >100.4   Complete by: As directed    Diet - low sodium heart healthy   Complete by: As directed    Discharge wound care:   Complete by: As directed    Okay to shower. Do not apply salves or appointments to incision. No heavy lifting with the upper extremities greater than 10 pounds. May resume driving when not requiring pain medication and patient feels comfortable with doing so.   Incentive spirometry RT   Complete by: As directed    Increase activity slowly   Complete by: As directed       Allergies as of 04/27/2022       Reactions   Flexeril [cyclobenzaprine] Anaphylaxis, Hives, Itching, Swelling   Zanaflex [tizanidine] Anaphylaxis, Hives, Itching, Swelling   Latex Rash         Medication List     TAKE these medications    ALPRAZolam 1 MG tablet Commonly known as: XANAX Take 0.5 mg by mouth at bedtime as needed for sleep.   amLODipine 10 MG tablet Commonly known as: NORVASC TAKE 1 TABLET(10 MG) BY MOUTH DAILY   BLUE-EMU PAIN RELIEF DRY EX Apply 1 application topically daily as needed (pain).   butalbital-acetaminophen-caffeine 50-325-40 MG tablet Commonly known as: FIORICET Take 1 tablet by mouth daily as needed for headache.   ezetimibe 10 MG tablet Commonly known as: ZETIA TAKE 1 TABLET(10 MG) BY MOUTH DAILY   hydrALAZINE 100 MG tablet Commonly known as: APRESOLINE Take 1 tablet (100 mg total) by mouth 3 (three) times daily. What changed: when to take this   HYDROcodone-acetaminophen 10-325 MG tablet Commonly known as: NORCO Take 1 tablet by mouth 3 (three) times daily as needed for moderate pain.   methocarbamol 500 MG tablet Commonly known as: ROBAXIN Take 1 tablet (500 mg total) by mouth every 6 (six) hours as needed for muscle spasms.   olmesartan 40 MG tablet Commonly known as: BENICAR Take 1 tablet (40 mg total) by mouth daily. What changed:  how much to take when to take this   oxyCODONE-acetaminophen 5-325 MG tablet Commonly known as: PERCOCET/ROXICET Take 1-2 tablets by mouth every 4 (four) hours as needed for moderate pain or severe pain.   valACYclovir 500 MG tablet Commonly known as: VALTREX TAKE 1 TABLET BY MOUTH TWICE DAILY FOR 3 TO 5 DAYS THEN  TAKE DAILY AS NEEDED What changed:  how much to take how to take this when to take this reasons to take this additional instructions   vitamin C 1000 MG tablet Take 1,000 mg by mouth in the morning.   Vitamin D 50 MCG (2000 UT) tablet Take 2,000 Units by mouth in the morning.   zolpidem 10 MG tablet Commonly known as: AMBIEN Take 10 mg by mouth at bedtime as needed for sleep.               Discharge Care Instructions  (From admission, onward)            Start     Ordered   04/27/22 0000  Discharge wound care:       Comments: Okay to shower. Do not apply salves or appointments to incision. No heavy lifting with the upper extremities greater than 10 pounds. May resume driving when not requiring pain medication and patient feels comfortable with doing so.   04/27/22 [place]. Follow up.   Why: The home health agency will contact you for the first home visit. Contact information: [place] [address] [phone]                 Signed: Earleen Newport 04/27/2022, 12:36 PM

## 2022-04-27 NOTE — Evaluation (Signed)
Physical Therapy Evaluation Patient Details Name: Robin Arellano MRN: [id number] DOB: 1951/07/21 Today's Date: 04/27/2022  History of Present Illness  Pt is a 71 y/o female presents on 6/15 s/p PLIF L4-5. PMH includes: anxiety, CAD, depression, fibromyalgia, HTn, MI, OA, prior ACDF.  Clinical Impression  Patient admitted following the above procedure. PTA, patient lives with husband and daughter and reports 54 falls since February. Patient presents with weakness, impaired balance, decreased activity tolerance, dizziness, and impaired sensation. Patient required overall min guard for ambulation with use of RW. Educated patient on back precautions, brace wear, and activity progression, patient verbalized understanding. Patient will benefit from skilled PT services during acute stay to address listed deficits. Recommend HHPT at discharge to maximize functional independence and home evaluation to prevent future falls.        Recommendations for follow up therapy are one component of a multi-disciplinary discharge planning process, led by the attending physician.  Recommendations may be updated based on patient status, additional functional criteria and insurance authorization.  Follow Up Recommendations Home health PT    Assistance Recommended at Discharge Frequent or constant Supervision/Assistance  Patient can return home with the following  A little help with walking and/or transfers;A little help with bathing/dressing/bathroom;Assistance with cooking/housework;Direct supervision/assist for medications management;Direct supervision/assist for financial management;Assist for transportation;Help with stairs or ramp for entrance    Equipment Recommendations Rolling Cain Fitzhenry (2 wheels)  Recommendations for Other Services       Functional Status Assessment Patient has had a recent decline in their functional status and demonstrates the ability to make significant improvements in function in a  reasonable and predictable amount of time.     Precautions / Restrictions Precautions Precautions: Fall;Back Precaution Booklet Issued: Yes (comment) Precaution Comments: reviewed with pt Required Braces or Orthoses: Spinal Brace Spinal Brace: Lumbar corset;Applied in sitting position Restrictions Weight Bearing Restrictions: No      Mobility  Bed Mobility Overal bed mobility: Needs Assistance Bed Mobility: Rolling, Sit to Sidelying Rolling: Min guard       Sit to sidelying: Min assist General bed mobility comments: assist to bring LEs back into bed    Transfers Overall transfer level: Needs assistance Equipment used: Rolling Andilyn Bettcher (2 wheels) Transfers: Sit to/from Stand Sit to Stand: Min guard           General transfer comment: cueing for hand placement and posture    Ambulation/Gait Ambulation/Gait assistance: Min guard Gait Distance (Feet): 75 Feet Assistive device: Rolling Calder Oblinger (2 wheels) Gait Pattern/deviations: Step-through pattern, Decreased stride length Gait velocity: decreaesd     General Gait Details: min guard for safety. Complaining of dizziness intermittently due to vertigo.  Stairs Stairs: Yes Stairs assistance: Min guard Stair Management: One rail Right, Step to pattern, Forwards Number of Stairs: 3 General stair comments: min guard for safety. Cues for up with stronger leg and down with weaker leg.  Wheelchair Mobility    Modified Rankin (Stroke Patients Only)       Balance Overall balance assessment: Needs assistance Sitting-balance support: No upper extremity supported, Feet supported Sitting balance-Leahy Scale: Good     Standing balance support: Bilateral upper extremity supported, During functional activity, No upper extremity supported Standing balance-Leahy Scale: Fair Standing balance comment: relies on RW dynamically, able to engage in ADLs without UE support and min guard                              Pertinent  Vitals/Pain Pain Assessment Pain Assessment: Faces Faces Pain Scale: Hurts little more Pain Location: back Pain Descriptors / Indicators: Discomfort, Operative site guarding Pain Intervention(s): Monitored during session, Limited activity within patient's tolerance, Repositioned    Home Living Family/patient expects to be discharged to:: Private residence Living Arrangements: Spouse/significant other;Children Available Help at Discharge: Family;Available 24 hours/day Type of Home: House Home Access: Stairs to enter Entrance Stairs-Rails: Psychiatric nurse of Steps: 5   Home Layout: One level Home Equipment: BSC/3in1;Shower seat;Grab bars - toilet;Grab bars - tub/shower;Toilet riser Additional Comments: pt reports daughter and spouse live at home, sister can come assist as needed    Prior Function Prior Level of Function : Needs assist;History of Falls (last six months)       Physical Assist : Mobility (physical);ADLs (physical) Mobility (physical): Transfers;Gait;Stairs ADLs (physical): Bathing;Dressing;Toileting;IADLs Mobility Comments: pt reports having assist for all mobility at home from family for safety, 18 falls since feb ADLs Comments: pt reports daughter or sister assist with ADLs as needed     Hand Dominance   Dominant Hand: Right    Extremity/Trunk Assessment   Upper Extremity Assessment Upper Extremity Assessment: Defer to OT evaluation    Lower Extremity Assessment Lower Extremity Assessment: Generalized weakness (L LE numbness and weakness)    Cervical / Trunk Assessment Cervical / Trunk Assessment: Back Surgery  Communication   Communication: No difficulties  Cognition Arousal/Alertness: Awake/alert Behavior During Therapy: WFL for tasks assessed/performed Overall Cognitive Status: No family/caregiver present to determine baseline cognitive functioning Area of Impairment: Awareness, Problem solving, Safety/judgement,  Attention                   Current Attention Level: Sustained     Safety/Judgement: Decreased awareness of safety Awareness: Emergent Problem Solving: Slow processing, Requires verbal cues          General Comments General comments (skin integrity, edema, etc.): pt reports hx of vertigo for 5 weeks    Exercises     Assessment/Plan    PT Assessment Patient needs continued PT services  PT Problem List Decreased strength;Decreased activity tolerance;Decreased balance;Decreased mobility;Decreased safety awareness;Decreased knowledge of precautions;Decreased knowledge of use of DME       PT Treatment Interventions DME instruction;Gait training;Stair training;Functional mobility training;Therapeutic activities;Therapeutic exercise;Balance training;Patient/family education;Neuromuscular re-education    PT Goals (Current goals can be found in the Care Plan section)  Acute Rehab PT Goals Patient Stated Goal: to go home PT Goal Formulation: With patient Time For Goal Achievement: 05/11/22 Potential to Achieve Goals: Good    Frequency Min 5X/week     Co-evaluation               AM-PAC PT "6 Clicks" Mobility  Outcome Measure Help needed turning from your back to your side while in a flat bed without using bedrails?: A Little Help needed moving from lying on your back to sitting on the side of a flat bed without using bedrails?: A Little Help needed moving to and from a bed to a chair (including a wheelchair)?: A Little Help needed standing up from a chair using your arms (e.g., wheelchair or bedside chair)?: A Little Help needed to walk in hospital room?: A Little Help needed climbing 3-5 steps with a railing? : A Little 6 Click Score: 18    End of Session Equipment Utilized During Treatment: Back brace;Gait belt Activity Tolerance: Patient tolerated treatment well Patient left: in bed;with call bell/phone within reach Nurse Communication: Mobility status PT  Visit Diagnosis:  Unsteadiness on feet (R26.81);Muscle weakness (generalized) (M62.81);History of falling (Z91.81);Repeated falls (R29.6);Difficulty in walking, not elsewhere classified (R26.2);Dizziness and giddiness (R42)    Time: 3419-6222 PT Time Calculation (min) (ACUTE ONLY): 30 min   Charges:   PT Evaluation $PT Eval Moderate Complexity: 1 Mod PT Treatments $Therapeutic Activity: 8-22 mins        Brayn Eckstein A. Gilford Rile PT, DPT Acute Rehabilitation Services Office [phone]   Linna Hoff 04/27/2022, 10:14 AM

## 2022-04-27 NOTE — Plan of Care (Signed)
  Problem: Education: Goal: Knowledge of General Education information will improve Description: Including pain rating scale, medication(s)/side effects and non-pharmacologic comfort measures Outcome: Completed/Met   Problem: Health Behavior/Discharge Planning: Goal: Ability to manage health-related needs will improve Outcome: Completed/Met   Problem: Clinical Measurements: Goal: Ability to maintain clinical measurements within normal limits will improve Outcome: Completed/Met Goal: Will remain free from infection Outcome: Completed/Met Goal: Diagnostic test results will improve Outcome: Completed/Met Goal: Respiratory complications will improve Outcome: Completed/Met Goal: Cardiovascular complication will be avoided Outcome: Completed/Met   Problem: Activity: Goal: Risk for activity intolerance will decrease Outcome: Completed/Met   Problem: Nutrition: Goal: Adequate nutrition will be maintained Outcome: Completed/Met   Problem: Coping: Goal: Level of anxiety will decrease Outcome: Completed/Met   Problem: Elimination: Goal: Will not experience complications related to bowel motility Outcome: Completed/Met Goal: Will not experience complications related to urinary retention Outcome: Completed/Met   Problem: Pain Managment: Goal: General experience of comfort will improve Outcome: Completed/Met   Problem: Safety: Goal: Ability to remain free from injury will improve Outcome: Completed/Met   Problem: Skin Integrity: Goal: Risk for impaired skin integrity will decrease Outcome: Completed/Met   Problem: Education: Goal: Ability to verbalize activity precautions or restrictions will improve Outcome: Completed/Met Goal: Knowledge of the prescribed therapeutic regimen will improve Outcome: Completed/Met Goal: Understanding of discharge needs will improve Outcome: Completed/Met   Problem: Activity: Goal: Ability to avoid complications of mobility impairment will  improve Outcome: Completed/Met Goal: Ability to tolerate increased activity will improve Outcome: Completed/Met Goal: Will remain free from falls Outcome: Completed/Met   Problem: Bowel/Gastric: Goal: Gastrointestinal status for postoperative course will improve Outcome: Completed/Met   Problem: Clinical Measurements: Goal: Ability to maintain clinical measurements within normal limits will improve Outcome: Completed/Met Goal: Postoperative complications will be avoided or minimized Outcome: Completed/Met Goal: Diagnostic test results will improve Outcome: Completed/Met   Problem: Pain Management: Goal: Pain level will decrease Outcome: Completed/Met   Problem: Skin Integrity: Goal: Will show signs of wound healing Outcome: Completed/Met   Problem: Health Behavior/Discharge Planning: Goal: Identification of resources available to assist in meeting health care needs will improve Outcome: Completed/Met   Problem: Bladder/Genitourinary: Goal: Urinary functional status for postoperative course will improve Outcome: Completed/Met

## 2022-04-27 NOTE — Progress Notes (Signed)
Patient transported to her vehicle via wheelchair by volunteer for discharge home; in no acute distress nor complaints of pain nor discomfort; moves all extremities well; incision on her lower back with honeycomb dressing and is clean, dry and intact; room was checked for all her belongings and were taken along by husband on the way to his vehicle; discharge instructions concerning her medications, incision care, follow up appointment and when to call the doctor as needed were all discussed with patient and her husband by RN and both expressed understanding on the instructions given.

## 2022-04-27 NOTE — TOC Transition Note (Signed)
Transition of Care [facility]) - CM/SW Discharge Note   Patient Details  Name: Robin Arellano MRN: [id number] Date of Birth: 1951/04/22  [facility] of [facility]) CM/SW Contact:  Pollie Friar, RN Phone Number: 04/27/2022, 9:46 AM   Clinical Narrative:    Patient will discharge home with home health service that were pre-arranged through [facility]. Information on the AVS. Any needed DME will be provided by bedside RN.  Pt has transportation home.    Final next level of care: Home w Home Health Services Barriers to Discharge: No Barriers Identified   Patient Goals and CMS Choice        Discharge Placement                       Discharge Plan and Services                          HH Arranged: PT, OT HH Agency: [place] Date [facility] Agency Contacted: 04/27/22      Social Determinants of Health (SDOH) Interventions     Readmission Risk Interventions     No data to display

## 2022-04-27 NOTE — Evaluation (Signed)
Occupational Therapy Evaluation Patient Details Name: Robin Arellano MRN: [id number] DOB: September 15, 1951 Today's Date: 04/27/2022   History of Present Illness Pt is a 71 y/o female presents on 6/15 s/p PLIF L4-5. PMH includes: anxiety, CAD, depression, fibromyalgia, HTn, MI, OA, prior ACDF.   Clinical Impression   PTA patient reports living at home with her spouse and daughter, requiring assist for mobility and ADLS (LB, showers) due to frequent falls (18 in the last 4 months) and pain. Admitted for above and limited by problem list below, including back precautions and pain, impaired balance, decreased activity tolerance, generalized weakness, and vertigo (for the last 5 weeks). Pt able to recall back precautions once educated, presents with slow processing and decreased problem solving.  She requires supervision to mod assist for ADLs, min guard for mobility and transfers using RW. Educated on safety, fall prevention, back precautions, ADL compensatory techniques (although reports preference to have family assist at dc), recommendations and DME.  She will benefit from continued OT services acutely and after dc at [facility] level to optimize independence, safety with ADLs.       Recommendations for follow up therapy are one component of a multi-disciplinary discharge planning process, led by the attending physician.  Recommendations may be updated based on patient status, additional functional criteria and insurance authorization.   Follow Up Recommendations  Home health OT    Assistance Recommended at Discharge Frequent or constant Supervision/Assistance  Patient can return home with the following A little help with walking and/or transfers;A lot of help with bathing/dressing/bathroom;Assistance with cooking/housework;Direct supervision/assist for medications management;Direct supervision/assist for financial management;Assist for transportation;Help with stairs or ramp for entrance    Functional  Status Assessment  Patient has had a recent decline in their functional status and demonstrates the ability to make significant improvements in function in a reasonable and predictable amount of time.  Equipment Recommendations  Other (comment) (RW)    Recommendations for Other Services PT consult     Precautions / Restrictions Precautions Precautions: Fall;Back Precaution Booklet Issued: Yes (comment) Precaution Comments: reviewed with pt Required Braces or Orthoses: Spinal Brace Spinal Brace: Lumbar corset;Applied in sitting position Restrictions Weight Bearing Restrictions: No      Mobility Bed Mobility Overal bed mobility: Needs Assistance Bed Mobility: Rolling, Sidelying to Sit Rolling: Min guard Sidelying to sit: Min guard, HOB elevated       General bed mobility comments: + rail    Transfers Overall transfer level: Needs assistance Equipment used: Rolling walker (2 wheels) Transfers: Sit to/from Stand Sit to Stand: Min guard           General transfer comment: cueing for hand placement and posture      Balance Overall balance assessment: Needs assistance Sitting-balance support: No upper extremity supported, Feet supported Sitting balance-Leahy Scale: Good     Standing balance support: Bilateral upper extremity supported, During functional activity, No upper extremity supported Standing balance-Leahy Scale: Fair Standing balance comment: relies on RW dynamically, able to engage in ADLs without UE support and min guard                           ADL either performed or assessed with clinical judgement   ADL Overall ADL's : Needs assistance/impaired     Grooming: Min guard;Wash/dry hands;Standing       Lower Body Bathing: Min guard;Sit to/from stand   Upper Body Dressing : Sitting;Supervision/safety Upper Body Dressing Details (indicate cue type and reason):  assist for brace mgmt Lower Body Dressing: Sit to/from stand;Moderate  assistance Lower Body Dressing Details (indicate cue type and reason): requires assist for socks and shoes, min guard sit to stand and able to pull pants over hips Toilet Transfer: Min guard;Ambulation;Rolling walker (2 wheels);Grab bars   Toileting- Clothing Manipulation and Hygiene: Minimal assistance;Sit to/from stand Toileting - Clothing Manipulation Details (indicate cue type and reason): clothing mgmt     Functional mobility during ADLs: Min guard;Rolling walker (2 wheels);Cueing for safety       Vision   Vision Assessment?: No apparent visual deficits     Perception     Praxis      Pertinent Vitals/Pain Pain Assessment Pain Assessment: Faces Faces Pain Scale: Hurts little more Pain Location: back Pain Descriptors / Indicators: Discomfort, Operative site guarding Pain Intervention(s): Limited activity within patient's tolerance, Monitored during session, Repositioned, Premedicated before session     Hand Dominance Right   Extremity/Trunk Assessment Upper Extremity Assessment Upper Extremity Assessment: Generalized weakness   Lower Extremity Assessment Lower Extremity Assessment: Defer to PT evaluation       Communication Communication Communication: No difficulties   Cognition Arousal/Alertness: Awake/alert Behavior During Therapy: WFL for tasks assessed/performed Overall Cognitive Status: No family/caregiver present to determine baseline cognitive functioning Area of Impairment: Awareness, Problem solving, Safety/judgement, Attention                   Current Attention Level: Sustained     Safety/Judgement: Decreased awareness of safety Awareness: Emergent Problem Solving: Slow processing, Requires verbal cues       General Comments  pt reports hx of vertigo for 5 weeks    Exercises     Shoulder Instructions      Home Living Family/patient expects to be discharged to:: Private residence Living Arrangements: Spouse/significant  other;Children Available Help at Discharge: Family;Available 24 hours/day Type of Home: House Home Access: Stairs to enter [employer] of Steps: 5 Entrance Stairs-Rails: Right;Left Home Layout: One level     Bathroom Shower/Tub: Occupational psychologist: Standard     Home Equipment: BSC/3in1;Shower seat;Grab bars - toilet;Grab bars - tub/shower;Toilet riser   Additional Comments: pt reports daughter and spouse live at home, sister can come assist as needed      Prior Functioning/Environment Prior Level of Function : Needs assist;History of Falls (last six months)       Physical Assist : Mobility (physical);ADLs (physical) Mobility (physical): Transfers;Gait;Stairs ADLs (physical): Bathing;Dressing;Toileting;IADLs Mobility Comments: pt reports having assist for all mobility at home from family for safety, 18 falls since feb ADLs Comments: pt reports daughter or sister assist with ADLs as needed        OT Problem List: Decreased strength;Decreased activity tolerance;Impaired balance (sitting and/or standing);Decreased safety awareness;Decreased knowledge of use of DME or AE;Decreased knowledge of precautions;Pain      OT Treatment/Interventions: Self-care/ADL training;Therapeutic exercise;DME and/or AE instruction;Therapeutic activities;Patient/family education;Balance training    OT Goals(Current goals can be found in the care plan section) Acute Rehab OT Goals Patient Stated Goal: home OT Goal Formulation: With patient Time For Goal Achievement: 05/11/22 Potential to Achieve Goals: Good  OT Frequency: Min 2X/week    Co-evaluation              AM-PAC OT "6 Clicks" Daily Activity     Outcome Measure Help from another person eating meals?: None Help from another person taking care of personal grooming?: A Little Help from another person toileting, which includes using toliet, bedpan,  or urinal?: A Little Help from another person bathing  (including washing, rinsing, drying)?: A Little Help from another person to put on and taking off regular upper body clothing?: A Little Help from another person to put on and taking off regular lower body clothing?: A Lot 6 Click Score: 18   End of Session Equipment Utilized During Treatment: Rolling walker (2 wheels);Gait belt;Back brace Nurse Communication: Mobility status  Activity Tolerance: Patient tolerated treatment well Patient left: with call bell/phone within reach;Other (comment) (sitting EOB with PT)  OT Visit Diagnosis: Other abnormalities of gait and mobility (R26.89);Muscle weakness (generalized) (M62.81);Pain;History of falling (Z91.81) Pain - part of body:  (back)                Time: 1410-3013 OT Time Calculation (min): 32 min Charges:  OT General Charges $OT Visit: 1 Visit OT Evaluation $OT Eval Moderate Complexity: 1 Mod OT Treatments $Self Care/Home Management : 8-22 mins  Jolaine Artist, OT Acute Rehabilitation Services Office [phone]   Robin Arellano 04/27/2022, 9:25 AM

## 2022-05-02 MED FILL — Sodium Chloride IV Soln 0.9%: INTRAVENOUS | Qty: 2000 | Status: AC

## 2022-05-02 MED FILL — Heparin Sodium (Porcine) Inj 1000 Unit/ML: INTRAMUSCULAR | Qty: 30 | Status: AC

## 2022-05-03 ENCOUNTER — Encounter: Payer: Self-pay | Admitting: Cardiovascular Disease

## 2022-05-08 ENCOUNTER — Ambulatory Visit: Payer: PPO | Admitting: Cardiovascular Disease

## 2022-05-14 ENCOUNTER — Emergency Department (HOSPITAL_COMMUNITY): Payer: PPO

## 2022-05-14 ENCOUNTER — Other Ambulatory Visit: Payer: Self-pay

## 2022-05-14 ENCOUNTER — Encounter (HOSPITAL_COMMUNITY): Payer: Self-pay

## 2022-05-14 ENCOUNTER — Inpatient Hospital Stay (HOSPITAL_COMMUNITY)
Admission: EM | Admit: 2022-05-14 | Discharge: 2022-05-22 | DRG: 299 | Disposition: A | Discharge status: Rehabilitation Facility | Payer: PPO | Attending: Internal Medicine | Admitting: Internal Medicine

## 2022-05-14 DIAGNOSIS — D638 Anemia in other chronic diseases classified elsewhere: Secondary | ICD-10-CM | POA: Diagnosis present

## 2022-05-14 DIAGNOSIS — R509 Fever, unspecified: Secondary | ICD-10-CM | POA: Diagnosis not present

## 2022-05-14 DIAGNOSIS — Z981 Arthrodesis status: Secondary | ICD-10-CM

## 2022-05-14 DIAGNOSIS — I2510 Atherosclerotic heart disease of native coronary artery without angina pectoris: Secondary | ICD-10-CM | POA: Diagnosis present

## 2022-05-14 DIAGNOSIS — D649 Anemia, unspecified: Secondary | ICD-10-CM | POA: Diagnosis not present

## 2022-05-14 DIAGNOSIS — I7122 Aneurysm of the aortic arch, without rupture: Principal | ICD-10-CM | POA: Diagnosis present

## 2022-05-14 DIAGNOSIS — M5441 Lumbago with sciatica, right side: Secondary | ICD-10-CM | POA: Diagnosis present

## 2022-05-14 DIAGNOSIS — I63432 Cerebral infarction due to embolism of left posterior cerebral artery: Secondary | ICD-10-CM | POA: Diagnosis present

## 2022-05-14 DIAGNOSIS — I639 Cerebral infarction, unspecified: Secondary | ICD-10-CM

## 2022-05-14 DIAGNOSIS — N179 Acute kidney failure, unspecified: Secondary | ICD-10-CM | POA: Diagnosis not present

## 2022-05-14 DIAGNOSIS — I252 Old myocardial infarction: Secondary | ICD-10-CM | POA: Diagnosis not present

## 2022-05-14 DIAGNOSIS — E7849 Other hyperlipidemia: Secondary | ICD-10-CM | POA: Diagnosis present

## 2022-05-14 DIAGNOSIS — M5442 Lumbago with sciatica, left side: Secondary | ICD-10-CM | POA: Diagnosis present

## 2022-05-14 DIAGNOSIS — N1832 Chronic kidney disease, stage 3b: Secondary | ICD-10-CM | POA: Diagnosis present

## 2022-05-14 DIAGNOSIS — I700 Atherosclerosis of aorta: Secondary | ICD-10-CM | POA: Diagnosis present

## 2022-05-14 DIAGNOSIS — E871 Hypo-osmolality and hyponatremia: Secondary | ICD-10-CM | POA: Diagnosis not present

## 2022-05-14 DIAGNOSIS — Z79899 Other long term (current) drug therapy: Secondary | ICD-10-CM

## 2022-05-14 DIAGNOSIS — B370 Candidal stomatitis: Secondary | ICD-10-CM | POA: Diagnosis not present

## 2022-05-14 DIAGNOSIS — G8191 Hemiplegia, unspecified affecting right dominant side: Secondary | ICD-10-CM | POA: Diagnosis present

## 2022-05-14 DIAGNOSIS — H5347 Heteronymous bilateral field defects: Secondary | ICD-10-CM | POA: Diagnosis present

## 2022-05-14 DIAGNOSIS — I729 Aneurysm of unspecified site: Secondary | ICD-10-CM | POA: Diagnosis present

## 2022-05-14 DIAGNOSIS — I10 Essential (primary) hypertension: Secondary | ICD-10-CM | POA: Diagnosis not present

## 2022-05-14 DIAGNOSIS — U071 COVID-19: Secondary | ICD-10-CM | POA: Diagnosis not present

## 2022-05-14 DIAGNOSIS — J9811 Atelectasis: Secondary | ICD-10-CM | POA: Diagnosis present

## 2022-05-14 DIAGNOSIS — G8929 Other chronic pain: Secondary | ICD-10-CM | POA: Diagnosis present

## 2022-05-14 DIAGNOSIS — W19XXXA Unspecified fall, initial encounter: Secondary | ICD-10-CM

## 2022-05-14 DIAGNOSIS — I129 Hypertensive chronic kidney disease with stage 1 through stage 4 chronic kidney disease, or unspecified chronic kidney disease: Secondary | ICD-10-CM | POA: Diagnosis present

## 2022-05-14 DIAGNOSIS — G8918 Other acute postprocedural pain: Secondary | ICD-10-CM | POA: Diagnosis not present

## 2022-05-14 DIAGNOSIS — R627 Adult failure to thrive: Secondary | ICD-10-CM | POA: Diagnosis present

## 2022-05-14 DIAGNOSIS — Z888 Allergy status to other drugs, medicaments and biological substances status: Secondary | ICD-10-CM

## 2022-05-14 DIAGNOSIS — Z9861 Coronary angioplasty status: Secondary | ICD-10-CM

## 2022-05-14 DIAGNOSIS — R739 Hyperglycemia, unspecified: Secondary | ICD-10-CM | POA: Diagnosis not present

## 2022-05-14 DIAGNOSIS — W1830XA Fall on same level, unspecified, initial encounter: Secondary | ICD-10-CM | POA: Diagnosis present

## 2022-05-14 DIAGNOSIS — I161 Hypertensive emergency: Secondary | ICD-10-CM | POA: Diagnosis present

## 2022-05-14 DIAGNOSIS — K838 Other specified diseases of biliary tract: Secondary | ICD-10-CM | POA: Diagnosis present

## 2022-05-14 DIAGNOSIS — I6389 Other cerebral infarction: Secondary | ICD-10-CM | POA: Diagnosis not present

## 2022-05-14 DIAGNOSIS — E782 Mixed hyperlipidemia: Secondary | ICD-10-CM

## 2022-05-14 DIAGNOSIS — M8448XA Pathological fracture, other site, initial encounter for fracture: Secondary | ICD-10-CM | POA: Diagnosis present

## 2022-05-14 DIAGNOSIS — Z91040 Latex allergy status: Secondary | ICD-10-CM

## 2022-05-14 DIAGNOSIS — R29704 NIHSS score 4: Secondary | ICD-10-CM | POA: Diagnosis present

## 2022-05-14 DIAGNOSIS — Z7982 Long term (current) use of aspirin: Secondary | ICD-10-CM

## 2022-05-14 DIAGNOSIS — R001 Bradycardia, unspecified: Secondary | ICD-10-CM | POA: Diagnosis not present

## 2022-05-14 DIAGNOSIS — I63532 Cerebral infarction due to unspecified occlusion or stenosis of left posterior cerebral artery: Secondary | ICD-10-CM | POA: Diagnosis not present

## 2022-05-14 DIAGNOSIS — E663 Overweight: Secondary | ICD-10-CM | POA: Diagnosis not present

## 2022-05-14 DIAGNOSIS — I712 Thoracic aortic aneurysm, without rupture, unspecified: Secondary | ICD-10-CM | POA: Diagnosis not present

## 2022-05-14 HISTORY — DX: Cerebral infarction, unspecified: I63.9

## 2022-05-14 LAB — CBG MONITORING, ED: Glucose-Capillary: 87 mg/dL (ref 70–99)

## 2022-05-14 LAB — COMPREHENSIVE METABOLIC PANEL
ALT: 17 U/L (ref 0–44)
AST: 26 U/L (ref 15–41)
Albumin: 3.1 g/dL — ABNORMAL LOW (ref 3.5–5.0)
Alkaline Phosphatase: 124 U/L (ref 38–126)
Anion gap: 9 (ref 5–15)
BUN: 19 mg/dL (ref 8–23)
CO2: 21 mmol/L — ABNORMAL LOW (ref 22–32)
Calcium: 8.6 mg/dL — ABNORMAL LOW (ref 8.9–10.3)
Chloride: 108 mmol/L (ref 98–111)
Creatinine, Ser: 1.38 mg/dL — ABNORMAL HIGH (ref 0.44–1.00)
GFR, Estimated: 41 mL/min — ABNORMAL LOW (ref >60)
Glucose, Bld: 87 mg/dL (ref 70–99)
Potassium: 4.5 mmol/L (ref 3.5–5.1)
Sodium: 138 mmol/L (ref 135–145)
Total Bilirubin: 0.9 mg/dL (ref 0.3–1.2)
Total Protein: 6.2 g/dL — ABNORMAL LOW (ref 6.5–8.1)

## 2022-05-14 LAB — CBC
HCT: 28.9 % — ABNORMAL LOW (ref 36.0–46.0)
Hemoglobin: 9.4 g/dL — ABNORMAL LOW (ref 12.0–15.0)
MCH: 32.8 pg (ref 26.0–34.0)
MCHC: 32.5 g/dL (ref 30.0–36.0)
MCV: 100.7 fL — ABNORMAL HIGH (ref 80.0–100.0)
Platelets: 411 10*3/uL — ABNORMAL HIGH (ref 150–400)
RBC: 2.87 MIL/uL — ABNORMAL LOW (ref 3.87–5.11)
RDW: 14.5 % (ref 11.5–15.5)
WBC: 11.9 10*3/uL — ABNORMAL HIGH (ref 4.0–10.5)
nRBC: 0 % (ref 0.0–0.2)

## 2022-05-14 LAB — I-STAT CHEM 8, ED
BUN: 24 mg/dL — ABNORMAL HIGH (ref 8–23)
Calcium, Ion: 1.06 mmol/L — ABNORMAL LOW (ref 1.15–1.40)
Chloride: 105 mmol/L (ref 98–111)
Creatinine, Ser: 1.4 mg/dL — ABNORMAL HIGH (ref 0.44–1.00)
Glucose, Bld: 85 mg/dL (ref 70–99)
HCT: 27 % — ABNORMAL LOW (ref 36.0–46.0)
Hemoglobin: 9.2 g/dL — ABNORMAL LOW (ref 12.0–15.0)
Potassium: 4.5 mmol/L (ref 3.5–5.1)
Sodium: 138 mmol/L (ref 135–145)
TCO2: 24 mmol/L (ref 22–32)

## 2022-05-14 LAB — TYPE AND SCREEN
ABO/RH(D): O POS
Antibody Screen: NEGATIVE

## 2022-05-14 LAB — MRSA NEXT GEN BY PCR, NASAL: MRSA by PCR Next Gen: NOT DETECTED

## 2022-05-14 LAB — GLUCOSE, CAPILLARY: Glucose-Capillary: 85 mg/dL (ref 70–99)

## 2022-05-14 MED ORDER — IOHEXOL 350 MG/ML SOLN
75.0000 mL | Freq: Once | INTRAVENOUS | Status: AC | PRN
  Administered 2022-05-14: 75 mL via INTRAVENOUS

## 2022-05-14 MED ORDER — METHOCARBAMOL 500 MG PO TABS
500.0000 mg | ORAL_TABLET | Freq: Four times a day (QID) | ORAL | Status: DC | PRN
  Administered 2022-05-14 – 2022-05-21 (×12): 500 mg via ORAL
  Filled 2022-05-14 (×12): qty 1

## 2022-05-14 MED ORDER — OXYCODONE-ACETAMINOPHEN 5-325 MG PO TABS
1.0000 | ORAL_TABLET | Freq: Four times a day (QID) | ORAL | Status: DC | PRN
  Administered 2022-05-14: 2 via ORAL
  Filled 2022-05-14: qty 2

## 2022-05-14 MED ORDER — ALPRAZOLAM 0.5 MG PO TABS
0.5000 mg | ORAL_TABLET | Freq: Every day | ORAL | Status: DC | PRN
  Administered 2022-05-14 – 2022-05-21 (×5): 0.5 mg via ORAL
  Filled 2022-05-14 (×6): qty 1

## 2022-05-14 MED ORDER — HYDRALAZINE HCL 50 MG PO TABS
100.0000 mg | ORAL_TABLET | Freq: Three times a day (TID) | ORAL | Status: DC
  Filled 2022-05-14 (×2): qty 2

## 2022-05-14 MED ORDER — AMLODIPINE BESYLATE 10 MG PO TABS
10.0000 mg | ORAL_TABLET | Freq: Every day | ORAL | Status: DC
  Administered 2022-05-14 – 2022-05-22 (×9): 10 mg via ORAL
  Filled 2022-05-14 (×2): qty 1
  Filled 2022-05-14: qty 2
  Filled 2022-05-14 (×6): qty 1

## 2022-05-14 MED ORDER — FENTANYL CITRATE PF 50 MCG/ML IJ SOSY
25.0000 ug | PREFILLED_SYRINGE | Freq: Once | INTRAMUSCULAR | Status: AC
  Administered 2022-05-14: 25 ug via INTRAVENOUS
  Filled 2022-05-14: qty 1

## 2022-05-14 MED ORDER — CLONIDINE HCL 0.1 MG PO TABS
0.3000 mg | ORAL_TABLET | Freq: Two times a day (BID) | ORAL | Status: DC
  Administered 2022-05-14 – 2022-05-16 (×4): 0.3 mg via ORAL
  Filled 2022-05-14 (×4): qty 3

## 2022-05-14 MED ORDER — IRBESARTAN 150 MG PO TABS: 150.0000 mg | ORAL_TABLET | Freq: Every day | ORAL | Status: DC

## 2022-05-14 MED ORDER — OXYCODONE HCL 5 MG PO TABS: 5.0000 mg | ORAL_TABLET | ORAL | Status: DC | PRN

## 2022-05-14 MED ORDER — POLYETHYLENE GLYCOL 3350 17 G PO PACK
17.0000 g | PACK | Freq: Every day | ORAL | Status: DC
  Administered 2022-05-15 – 2022-05-20 (×6): 17 g via ORAL
  Filled 2022-05-14 (×9): qty 1

## 2022-05-14 MED ORDER — INSULIN ASPART 100 UNIT/ML IJ SOLN
1.0000 [IU] | INTRAMUSCULAR | Status: DC
  Administered 2022-05-16: 1 [IU] via SUBCUTANEOUS
  Administered 2022-05-16: 2 [IU] via SUBCUTANEOUS
  Administered 2022-05-17 (×2): 1 [IU] via SUBCUTANEOUS

## 2022-05-14 MED ORDER — OXYCODONE-ACETAMINOPHEN 5-325 MG PO TABS
1.0000 | ORAL_TABLET | ORAL | Status: DC | PRN
  Administered 2022-05-14 – 2022-05-18 (×17): 2 via ORAL
  Administered 2022-05-18: 1 via ORAL
  Administered 2022-05-18: 2 via ORAL
  Filled 2022-05-14 (×20): qty 2

## 2022-05-14 MED ORDER — HYDRALAZINE HCL 50 MG PO TABS
100.0000 mg | ORAL_TABLET | Freq: Three times a day (TID) | ORAL | Status: DC
Start: 2022-05-14 — End: 2022-05-22
  Administered 2022-05-14 – 2022-05-22 (×24): 100 mg via ORAL
  Filled 2022-05-14 (×25): qty 2

## 2022-05-14 MED ORDER — EZETIMIBE 10 MG PO TABS
10.0000 mg | ORAL_TABLET | Freq: Every day | ORAL | Status: DC
  Administered 2022-05-15 – 2022-05-22 (×8): 10 mg via ORAL
  Filled 2022-05-14 (×8): qty 1

## 2022-05-14 MED ORDER — SODIUM CHLORIDE 0.9% IV SOLUTION: Freq: Once | INTRAVENOUS | Status: DC

## 2022-05-14 MED ORDER — IRBESARTAN 300 MG PO TABS
300.0000 mg | ORAL_TABLET | Freq: Every day | ORAL | Status: DC
  Administered 2022-05-14 – 2022-05-16 (×3): 300 mg via ORAL
  Filled 2022-05-14 (×4): qty 1

## 2022-05-14 MED ORDER — NICARDIPINE HCL IN NACL 20-0.86 MG/200ML-% IV SOLN: 3.0000 mg/h | INTRAVENOUS | Status: DC

## 2022-05-14 MED ORDER — HYDROCODONE-ACETAMINOPHEN 10-325 MG PO TABS: 1.0000 | ORAL_TABLET | Freq: Three times a day (TID) | ORAL | Status: DC | PRN

## 2022-05-14 MED ORDER — OXYCODONE-ACETAMINOPHEN 5-325 MG PO TABS
1.0000 | ORAL_TABLET | ORAL | Status: DC | PRN
Start: 2022-05-14 — End: 2022-05-14

## 2022-05-14 MED ORDER — SODIUM CHLORIDE 0.9 % IV BOLUS
1000.0000 mL | Freq: Once | INTRAVENOUS | Status: AC
  Administered 2022-05-14: 1000 mL via INTRAVENOUS

## 2022-05-14 MED ORDER — DOCUSATE SODIUM 100 MG PO CAPS
100.0000 mg | ORAL_CAPSULE | Freq: Every day | ORAL | Status: DC
  Administered 2022-05-14 – 2022-05-21 (×8): 100 mg via ORAL
  Filled 2022-05-14 (×9): qty 1

## 2022-05-14 MED ORDER — ESMOLOL HCL-SODIUM CHLORIDE 2000 MG/100ML IV SOLN
25.0000 ug/kg/min | INTRAVENOUS | Status: DC
  Administered 2022-05-14: 200 ug/kg/min via INTRAVENOUS
  Administered 2022-05-14 – 2022-05-15 (×2): 25 ug/kg/min via INTRAVENOUS
  Administered 2022-05-15 (×2): 200 ug/kg/min via INTRAVENOUS
  Administered 2022-05-16 (×2): 250 ug/kg/min via INTRAVENOUS
  Administered 2022-05-16: 200 ug/kg/min via INTRAVENOUS
  Administered 2022-05-16 – 2022-05-17 (×2): 50 ug/kg/min via INTRAVENOUS
  Administered 2022-05-17: 125 ug/kg/min via INTRAVENOUS
  Filled 2022-05-14 (×11): qty 100

## 2022-05-14 MED ORDER — SENNA 8.6 MG PO TABS: 1.0000 | ORAL_TABLET | Freq: Every evening | ORAL | Status: DC | PRN

## 2022-05-14 NOTE — ED Notes (Addendum)
NP Moshe Cipro and MD James Ivanoff notified that pt HR was <60 on 126mg of esmolol so pt was titrated down to 75 mcg. Pt BP still >120. Per NP SMoshe Cipropt does not need blood transfusion at this time - just a type and screen

## 2022-05-14 NOTE — Progress Notes (Signed)
[place] Progress Note Patient Name: Robin Arellano DOB: 03-01-1951 MRN: [id number]   Date of Service  05/14/2022  HPI/Events of Note  Received query from bedside RN regarding parameters for titrating esmolol.   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  Goal BP set at <120, HR < 60.  eICU Interventions  Advised RN to give oral anti hypertensive now including hydralazine and amlodipine.  Esmolol parameters added to include HR 50-60, goal SBP <120.     Intervention Category Intermediate Interventions: Hypertension - evaluation and management  Elsie Lincoln 05/14/2022, 8:07 PM

## 2022-05-14 NOTE — ED Notes (Signed)
Placed a external cath and a brief patient is resting with call bell in reach

## 2022-05-14 NOTE — ED Provider Notes (Signed)
[facility] EMERGENCY DEPARTMENT Provider Note   CSN: [id number] Arrival date & time: 05/14/22  1038     History  Chief Complaint  Patient presents with   Fall   Back Pain    Robin Arellano is a 71 y.o. female with chief complaint of multiple bodily pains after sustaining a fall around 2 AM this morning.  Patient woke up at 1 AM, which is not unusual for her, and took a Robaxin.  Then noticed the moon outside, went to go look at it and sat in a chair.  She then went to stand up and walk around, said that her left leg became painful and she fell backwards.  Had her phone on her, called her husband, who came and helped her up.  In pain since the fall.  Initially denies hitting head, but later thinks she might have due some head pain/neck pain.  Denies LOC or feeling lightheaded/dizzy before the fall.  Known hx of bilateral sciatica.  Treated by pain management.  Not on anticoagulation.  Recent posterior lumbar fusion 2 weeks ago on 04/26/2022 - Dr. Ellene Route.  The history is provided by the patient and medical records.  Fall  Back Pain      Home Medications Prior to Admission medications   Medication Sig Start Date End Date Taking? Authorizing Provider  ALPRAZolam Duanne Moron) 1 MG tablet Take 0.5 mg by mouth daily as needed for sleep or anxiety. 03/09/22  Yes [provider]  amLODipine (NORVASC) 10 MG tablet TAKE 1 TABLET(10 MG) BY MOUTH DAILY Patient taking differently: Take 10 mg by mouth daily. 10/30/21  Yes Troy Sine, MD  Ascorbic Acid (VITAMIN C) 1000 MG tablet Take 1,000 mg by mouth in the morning.   Yes [provider]  aspirin EC 81 MG tablet Take 81 mg by mouth as needed (chest pain). Swallow whole.   Yes [provider]  Calcium Carbonate Antacid (TUMS PO) Take 2 tablets by mouth daily as needed (stomach pain).   Yes [provider]  Cholecalciferol (VITAMIN D) 50 MCG (2000 UT) tablet Take 2,000 Units by mouth in  the morning.   Yes [provider]  ezetimibe (ZETIA) 10 MG tablet TAKE 1 TABLET(10 MG) BY MOUTH DAILY Patient taking differently: Take 10 mg by mouth daily. 11/29/21  Yes Troy Sine, MD  hydrALAZINE (APRESOLINE) 100 MG tablet Take 1 tablet (100 mg total) by mouth 3 (three) times daily. Patient taking differently: Take 100 mg by mouth in the morning. 10/04/21  Yes Troy Sine, MD  Lidocaine (BLUE-EMU PAIN RELIEF DRY EX) Apply 1 application  topically as needed (pain).   Yes [provider]  methocarbamol (ROBAXIN) 500 MG tablet Take 1 tablet (500 mg total) by mouth every 6 (six) hours as needed for muscle spasms. 04/27/22  Yes Kristeen Miss, MD  methylPREDNISolone (MEDROL DOSEPAK) 4 MG TBPK tablet Take 4 mg by mouth in the morning, at noon, in the evening, and at bedtime. 05/08/22  Yes [provider]  olmesartan (BENICAR) 40 MG tablet Take 1 tablet (40 mg total) by mouth daily. Patient taking differently: Take 20 mg by mouth in the morning. 08/14/21 08/09/22 Yes Duke, Tami Lin, PA  oxyCODONE-acetaminophen (PERCOCET/ROXICET) 5-325 MG tablet Take 1-2 tablets by mouth every 4 (four) hours as needed for moderate pain or severe pain. Patient taking differently: Take 1 tablet by mouth every 6 (six) hours as needed for moderate pain or severe pain. 04/27/22  Yes  Kristeen Miss, MD  valACYclovir (VALTREX) 500 MG tablet TAKE 1 TABLET BY MOUTH TWICE DAILY FOR 3 TO 5 DAYS THEN TAKE DAILY AS NEEDED Patient taking differently: Take 500 mg by mouth 2 (two) times daily as needed (shingles flair up). 12/10/19  Yes Huel Cote, NP  butalbital-acetaminophen-caffeine (FIORICET, ESGIC) 50-325-40 MG tablet Take 1 tablet by mouth daily as needed for headache. Patient not taking: Reported on 05/14/2022 05/09/17   [provider]  HYDROcodone-acetaminophen (NORCO) 10-325 MG tablet Take 1 tablet by mouth 3 (three) times daily as needed for moderate pain. Patient not taking:  Reported on 05/14/2022 04/18/20   [provider]  zolpidem (AMBIEN) 10 MG tablet Take 10 mg by mouth at bedtime as needed for sleep.  Patient not taking: Reported on 05/14/2022 01/09/14   [provider]      Allergies    Flexeril [cyclobenzaprine], Zanaflex [tizanidine], and Latex    Review of Systems   Review of Systems  Musculoskeletal:  Positive for back pain.    Physical Exam Updated Vital Signs BP (!) 193/89   Pulse 70   Temp (!) 97.5 F (36.4 C) (Oral)   Resp (!) 24   Ht '5\' 3"'$  (1.6 m)   Wt 60.3 kg   LMP  (LMP Unknown)   SpO2 99%   BMI 23.56 kg/m  Physical Exam Vitals and nursing note reviewed.  Constitutional:      General: She is not in acute distress.    Appearance: She is well-developed. She is not ill-appearing, toxic-appearing or diaphoretic.  HENT:     Head: Normocephalic and atraumatic.     Comments: No hematoma, open wound, ecchymosis, laceration, abrasion appreciated on the head.  Tenderness of the midline occipital aspect of the skull, without obvious evidence of trauma.    Mouth/Throat:     Mouth: Mucous membranes are dry.     Pharynx: Oropharynx is clear.  Eyes:     General: Lids are normal. Gaze aligned appropriately. No scleral icterus.    Conjunctiva/sclera: Conjunctivae normal.  Cardiovascular:     Rate and Rhythm: Normal rate and regular rhythm.     Pulses: Normal pulses.          Radial pulses are 2+ on the right side and 2+ on the left side.     Heart sounds: Murmur heard.     Comments: Without unilateral calf tenderness, swelling, erythema, warmth. Pulmonary:     Effort: Pulmonary effort is normal. No respiratory distress.     Breath sounds: Normal breath sounds.  Chest:     Chest wall: No tenderness.     Comments: CTAB, able to communicate without difficulty, without increased respiratory effort. Abdominal:     Palpations: Abdomen is soft.     Tenderness: There is no abdominal tenderness.  Musculoskeletal:        General:  No swelling.     Cervical back: Neck supple. Tenderness (Midline) present.     Right lower leg: No edema.     Left lower leg: No edema.     Comments: Multiple areas of midline tenderness in the spine throughout the cervical, thoracic, and lumbar regions.  Gross paraspinal muscle tenderness as well.  Recent incisions near the L4-L5 appreciated, without signs of surrounding/superficial infection.  Skin:    General: Skin is warm and dry.     Capillary Refill: Capillary refill takes less than 2 seconds.     Coloration: Skin is not jaundiced or pale.  Findings: No bruising or erythema.  Neurological:     Mental Status: She is alert and oriented to person, place, and time.     GCS: GCS eye subscore is 4. GCS verbal subscore is 5. GCS motor subscore is 6.     Cranial Nerves: No dysarthria or facial asymmetry.  Psychiatric:        Mood and Affect: Mood normal.     ED Results / Procedures / Treatments   Labs (all labs ordered are listed, but only abnormal results are displayed) Labs Reviewed  COMPREHENSIVE METABOLIC PANEL - Abnormal; Notable for the following components:      Result Value   CO2 21 (*)    Creatinine, Ser 1.38 (*)    Calcium 8.6 (*)    Total Protein 6.2 (*)    Albumin 3.1 (*)    GFR, Estimated 41 (*)    All other components within normal limits  CBC - Abnormal; Notable for the following components:   WBC 11.9 (*)    RBC 2.87 (*)    Hemoglobin 9.4 (*)    HCT 28.9 (*)    MCV 100.7 (*)    Platelets 411 (*)    All other components within normal limits  I-STAT CHEM 8, ED - Abnormal; Notable for the following components:   BUN 24 (*)    Creatinine, Ser 1.40 (*)    Calcium, Ion 1.06 (*)    Hemoglobin 9.2 (*)    HCT 27.0 (*)    All other components within normal limits  CBG MONITORING, ED    EKG None  Radiology CT CHEST ABDOMEN PELVIS W CONTRAST  Addendum Date: 05/14/2022   ADDENDUM REPORT: 05/14/2022 14:45 ADDENDUM: Based on the location of the pseudoaneurysm  is near the ligamentum arteriosum the possibility of acute injury to a pre-existing pseudoaneurysm is considered and acute aortic injury is possible. For this reason thoracic or vascular surgery evaluation is suggested as outlined in the initial report. These results were called by telephone at the time of interpretation on 05/14/2022 at 2:45 pm to provider Dr. Roderic Palau, Who verbally acknowledged these results. Dr. Roderic Palau also to the initial call for this patient as outlined in the initial report. Electronically Signed   By: Zetta Bills M.D.   On: 05/14/2022 14:45   Result Date: 05/14/2022 CLINICAL DATA:  A 71 year old female presents for evaluation of trauma post fall. EXAM: CT CHEST, ABDOMEN, AND PELVIS WITH CONTRAST TECHNIQUE: Multidetector CT imaging of the chest, abdomen and pelvis was performed following the standard protocol during bolus administration of intravenous contrast. RADIATION DOSE REDUCTION: This exam was performed according to the departmental dose-optimization program which includes automated exposure control, adjustment of the mA and/or kV according to patient size and/or use of iterative reconstruction technique. CONTRAST:  36m OMNIPAQUE IOHEXOL 350 MG/ML SOLN COMPARISON:  Previous imaging of the abdomen and pelvis. No dedicated imaging of the chest is available for comparison aside from previous cardiac imaging which was performed in December of 2019. FINDINGS: CT CHEST FINDINGS Cardiovascular: Calcified and noncalcified atheromatous plaque in the thoracic aorta. Focal outpouching from the aortic arch with subtle surrounding stranding, extending beyond confines of the thoracic aorta measuring 1.3 x 2.0 cm and without prior for comparison. Abundant calcified and noncalcified plaque with areas of marked plaque irregularity grossly similar to previous imaging with respect to imaged portions of the aorta. Second area of suspected penetrating atherosclerotic ulcer or developing pseudoaneurysm  noted on image 36/3 but without surrounding stranding. Also seen  on a image 59 of series 3 along the superior aspect of the thoracic aorta is a similar focal outpouching also without surrounding stranding. No signs of aortic dissection or additional suspected or potential acute process. Central pulmonary vessels are normal caliber. Mediastinum/Nodes: Mild stranding about the suspected pseudoaneurysm/penetrating ulcer that extends from the LEFT lateral aspect of the thoracic aorta. No thoracic inlet lymphadenopathy. No axillary lymphadenopathy. No mediastinal lymphadenopathy. No hilar lymphadenopathy. Esophagus is grossly normal. Lungs/Pleura: No consolidation. No pleural effusion. No pneumothorax. PICC PICC basilar atelectasis. Airways are patent. Musculoskeletal: No contusion over the body wall. See dedicated musculoskeletal section below for further detail. CT ABDOMEN PELVIS FINDINGS Hepatobiliary: Increasing biliary duct distension since previous imaging from 2021 both intra and extrahepatic biliary duct distension. Transition is at the level of the ampulla. No discrete abnormality is noted to explain these findings. Common bile duct is 14 mm greatest axial dimension which is similar though intrahepatic biliary duct distension is moderate and increased since previous imaging. Pancreas: Pancreatic atrophy without inflammation or visible lesion. No ductal dilation. Spleen: Normal. Adrenals/Urinary Tract: Adrenal glands are normal. Normal RIGHT kidney. Atrophy of the upper pole the LEFT kidney. No hydronephrosis or suspicious renal lesion. Smooth contour the urinary bladder. Stomach/Bowel: Query thickening of the ascending colon. No adjacent stranding. Sigmoid diverticulosis and diverticular changes without adjacent stranding. Appendix not visualized, no secondary signs to suggest acute appendicitis. No acute gastric or small bowel process. Vascular/Lymphatic: Aortic atherosclerosis both calcified and noncalcified  in the abdominal aorta. No surrounding stranding. No aneurysmal dilation of the abdominal aorta. Reproductive: Post hysterectomy.  No adnexal masses. Other: No ascites. Musculoskeletal: Sclerosis along the RIGHT hemi sacrum. This shows a sub chondral bandlike distribution and was not present on previous imaging. The sacroiliac joints are intact. Symphysis pubis is intact. No sign of displaced fracture. No sign of displaced rib fracture.  Sternum is intact. Signs of L4-5 spinal fusion with grade 1 anterolisthesis of L4 on L5. Anterolisthesis new from imaging in 2021 and perhaps mildly increased compared to imaging from December of 2022, not changed compared to recent postoperative imaging and associated with interbody fusion. Small amount of fluid in the subcutaneous fat overlying the surgical site (image 84/3) 3 x 2.2 cm. The IMPRESSION: 1. 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 Based on appearance thoracic or vascular surgery consultation is suggested particularly if there is chest pain to exclude acute aortic syndrome relating to pseudoaneurysm. Etiology of this finding may relate to underlying penetrating atherosclerotic ulcer based on morphology and presence of similar smaller areas that are seen in the aorta without surrounding stranding. 2. 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 3. Irregular and abundant soft plaque in the thoracic and abdominal aorta could be a risk for distal embolization. 4. Thickening of the ascending and transverse colon may reflect findings of colitis other previous or mild to moderate current colitis. There is no adjacent stranding or free fluid in the pelvis. Correlate with severity of abdominal trauma and consider repeat imaging if there is worsening of symptoms. Would also  correlate with any symptoms of colitis or recent colitis. 5. Findings of RIGHT sacral insufficiency fracture. 6. Recent postoperative changes related to lumbar spinal fusion. Small nonspecific fluid in the subcutaneous fat. This potentially represents seroma should be correlated with any worsening symptoms in this location or signs  infection. 7. Increasing biliary duct distension now with moderate intrahepatic biliary duct distension in similar common bile duct distension without clear cause. Correlate with any symptoms of biliary obstruction or RIGHT upper quadrant pain with further imaging on follow-up as warranted. These results were called by telephone at the time of interpretation on 05/14/2022 at 2:26 pm to provider [facility] , who verbally acknowledged these results. Electronically Signed: By: Zetta Bills M.D. On: 05/14/2022 14:26   CT T-SPINE NO CHARGE  Result Date: 05/14/2022 CLINICAL DATA:  Left leg pain after fall. Recent lower back surgery 2 weeks ago. EXAM: CT THORACIC AND LUMBAR SPINE WITHOUT CONTRAST TECHNIQUE: Multidetector CT imaging of the thoracic and lumbar spine was performed without intravenous contrast. Multiplanar CT image reconstructions were also generated. RADIATION DOSE REDUCTION: This exam was performed according to the departmental dose-optimization program which includes automated exposure control, adjustment of the mA and/or kV according to patient size and/or use of iterative reconstruction technique. COMPARISON:  Lumbar spine x-rays dated [place] 30, 2023. CT lumbar myelogram dated October 25, 2021. FINDINGS: CT THORACIC SPINE FINDINGS Alignment: Slight dextrocurvature.  No significant listhesis. Vertebrae: No acute fracture or focal pathologic process. Paraspinal and other soft tissues: Please see separate CT chest report from same day. Disc levels: Disc heights are preserved. Scattered facet arthropathy. No significant spinal canal or neuroforaminal stenosis. CT LUMBAR  SPINE FINDINGS Segmentation: 5 lumbar type vertebrae. Alignment: No traumatic malalignment. Unchanged 8 mm anterolisthesis at L4-L5. Vertebrae: L4-L5 PLIF. No evidence of hardware failure or loosening. No acute fracture or focal pathologic process. Paraspinal and other soft tissues: Subacute appearing fracture of the right sacral ala with surrounding sclerosis, new since December. Disc levels: Unchanged mild disc bulging at L3-L4 and moderate disc bulging at L5-S1. L4-L5 PLIF with bilateral laminectomies. Unchanged moderate to severe right and mild left facet arthropathy at L5-S1. No significant spinal canal or neuroforaminal stenosis at any level. IMPRESSION: 1. No acute osseous abnormality of the thoracic or lumbar spine. 2. Subacute appearing fracture of the right sacral ala with surrounding sclerosis, new since December. 3. L4-L5 PLIF without evidence of hardware complication. Electronically Signed   By: Titus Dubin M.D.   On: 05/14/2022 14:06   CT L-SPINE NO CHARGE  Result Date: 05/14/2022 CLINICAL DATA:  Left leg pain after fall. Recent lower back surgery 2 weeks ago. EXAM: CT THORACIC AND LUMBAR SPINE WITHOUT CONTRAST TECHNIQUE: Multidetector CT imaging of the thoracic and lumbar spine was performed without intravenous contrast. Multiplanar CT image reconstructions were also generated. RADIATION DOSE REDUCTION: This exam was performed according to the departmental dose-optimization program which includes automated exposure control, adjustment of the mA and/or kV according to patient size and/or use of iterative reconstruction technique. COMPARISON:  Lumbar spine x-rays dated [place] 30, 2023. CT lumbar myelogram dated October 25, 2021. FINDINGS: CT THORACIC SPINE FINDINGS Alignment: Slight dextrocurvature.  No significant listhesis. Vertebrae: No acute fracture or focal pathologic process. Paraspinal and other soft tissues: Please see separate CT chest report from same day. Disc levels: Disc heights are  preserved. Scattered facet arthropathy. No significant spinal canal or neuroforaminal stenosis. CT LUMBAR SPINE FINDINGS Segmentation: 5 lumbar type vertebrae. Alignment: No traumatic malalignment. Unchanged 8 mm anterolisthesis at L4-L5. Vertebrae: L4-L5 PLIF. No evidence of hardware failure or loosening. No acute fracture or focal pathologic process. Paraspinal and other soft tissues: Subacute appearing fracture of the right sacral ala with surrounding sclerosis, new since December. Disc levels: Unchanged mild disc bulging at L3-L4 and moderate disc  bulging at L5-S1. L4-L5 PLIF with bilateral laminectomies. Unchanged moderate to severe right and mild left facet arthropathy at L5-S1. No significant spinal canal or neuroforaminal stenosis at any level. IMPRESSION: 1. No acute osseous abnormality of the thoracic or lumbar spine. 2. Subacute appearing fracture of the right sacral ala with surrounding sclerosis, new since December. 3. L4-L5 PLIF without evidence of hardware complication. Electronically Signed   By: Titus Dubin M.D.   On: 05/14/2022 14:06   CT CERVICAL SPINE WO CONTRAST  Result Date: 05/14/2022 CLINICAL DATA:  Poly trauma, blunt.  Fell on the porch last night. EXAM: CT CERVICAL SPINE WITHOUT CONTRAST TECHNIQUE: Multidetector CT imaging of the cervical spine was performed without intravenous contrast. Multiplanar CT image reconstructions were also generated. RADIATION DOSE REDUCTION: This exam was performed according to the departmental dose-optimization program which includes automated exposure control, adjustment of the mA and/or kV according to patient size and/or use of iterative reconstruction technique. COMPARISON:  None FINDINGS: Alignment: Straightening of the normal cervical lordosis. Degenerative type anterolisthesis at C4-5 of 2 mm. Skull base and vertebrae: Distant ACDF C5-C7. No evidence of regional fracture. Soft tissues and spinal canal: No traumatic soft tissue finding. Disc  levels: Ordinary osteoarthritis at the C1-2 articulation. C2-3 facet osteoarthritis without significant stenosis. C3-4 facet osteoarthritis and small uncovertebral osteophytes. Foraminal narrowing on the right that could possibly be symptomatic. C4-5 facet arthropathy with 2 mm of degenerative anterolisthesis. Bony foraminal narrowing on the right that could possibly be symptomatic. Solid union from C5 through C7 with wide patency of the canal and foramina. Some chronic bony foraminal narrowing at C5-6, not likely compressive. No significant C7-T1 finding. Upper chest: Negative Other: None IMPRESSION: No acute or traumatic finding. Chronic ACDF C5 through C7 with solid union and sufficient patency of the canal and foramina at those levels. C4-5 facet arthropathy with 2 mm of degenerative anterolisthesis. Bony foraminal narrowing on the right at C3-4 and C4-5. Chronic bony foraminal narrowing at C5-6. Electronically Signed   By: Nelson Chimes M.D.   On: 05/14/2022 13:53   CT HEAD WO CONTRAST  Result Date: 05/14/2022 CLINICAL DATA:  Head trauma, moderate-severe. Fell on the porch last night EXAM: CT HEAD WITHOUT CONTRAST TECHNIQUE: Contiguous axial images were obtained from the base of the skull through the vertex without intravenous contrast. RADIATION DOSE REDUCTION: This exam was performed according to the departmental dose-optimization program which includes automated exposure control, adjustment of the mA and/or kV according to patient size and/or use of iterative reconstruction technique. COMPARISON:  09/12/2018 FINDINGS: Brain: No focal abnormality seen affecting the brainstem or cerebellum. Cerebral hemispheres show chronic small-vessel ischemic changes throughout the white matter, progressive since 2019. Old appearing small vessel infarctions of the right putamen and left caudate. Question acute/subacute infarction of the medial left parietooccipital junction. No hemorrhage, hydrocephalus or extra-axial  collection. Vascular: There is atherosclerotic calcification of the major vessels at the base of the brain. Skull: Negative Sinuses/Orbits: Clear/normal Other: None IMPRESSION: No acute traumatic finding. Chronic small-vessel ischemic changes of the cerebral hemispheric white matter and basal ganglia, progressive since 2019. Suspicion of small acute cortical infarction in the medial left parietooccipital junction region without evidence of mass effect or hemorrhage. Consider confirmation with MRI. Electronically Signed   By: Nelson Chimes M.D.   On: 05/14/2022 13:48   DG Hip Unilat W or Wo Pelvis 2-3 Views Right  Result Date: 05/14/2022 CLINICAL DATA:  Fall, left lower extremity pain EXAM: DG HIP (WITH OR WITHOUT PELVIS) 2-3V  RIGHT COMPARISON:  None Available. FINDINGS: No right hip fracture or dislocation. Small superior right greater trochanter enthesophytes. No significant right hip arthropathy. No suspicious focal osseous lesions. No radiopaque foreign bodies. IMPRESSION: No right hip fracture or dislocation. Electronically Signed   By: Ilona Sorrel M.D.   On: 05/14/2022 12:02   DG Hip Unilat W or Wo Pelvis 2-3 Views Left  Result Date: 05/14/2022 CLINICAL DATA:  Fall, left lower extremity pain EXAM: DG HIP (WITH OR WITHOUT PELVIS) 2-3V LEFT COMPARISON:  None Available. FINDINGS: No pelvic fracture or diastasis. No left hip fracture or dislocation. No suspicious focal osseous lesions. Bilateral posterior spinal fusion hardware at L4-5. No significant left hip arthropathy. IMPRESSION: No left hip fracture or malalignment. Electronically Signed   By: Ilona Sorrel M.D.   On: 05/14/2022 12:01    Procedures Procedures    Medications Ordered in ED Medications  esmolol (BREVIBLOC) 2000 mg / 100 mL (20 mg/mL) infusion (has no administration in time range)  sodium chloride 0.9 % bolus 1,000 mL (1,000 mLs Intravenous New Bag/Given 05/14/22 1234)  fentaNYL (SUBLIMAZE) injection 25 mcg (25 mcg Intravenous Given  05/14/22 1234)  iohexol (OMNIPAQUE) 350 MG/ML injection 75 mL (75 mLs Intravenous Contrast Given 05/14/22 1344)  fentaNYL (SUBLIMAZE) injection 25 mcg (25 mcg Intravenous Given 05/14/22 1552)    ED Course/ Medical Decision Making/ A&P                          Medical Decision Making Amount and/or Complexity of Data Reviewed Labs: ordered. Radiology: ordered. Decision-making details documented in ED Course.  Risk Prescription drug management. Decision regarding hospitalization.   71 y.o. female presents to the ED for concern of Fall and Back Pain   This involves an extensive number of treatment options, and is a complaint that carries with it a high risk of complications and morbidity.  The emergent differential diagnosis prior to evaluation includes, but is not limited to: Fracture, contusion, dislocation, ICH, sciatica  This is not an exhaustive differential.   Past Medical History / Co-morbidities / Social History: Hx of fibromyalgia, headaches, acute pancreatitis, renal insufficiency, HTN, hyperlipidemia, CAD, anxiety.  Recent lower back surgery 2 weeks ago of posterior fusion.  Additional History:  Internal and external records from outside source obtained and reviewed including surgical reports, ED visits, cardiology  Lab Tests: I ordered, and personally interpreted labs.  The pertinent results include:   I-stat chem 8: Creatinine 1.40, which is consistent with baseline.  Hgb 9.2, HCT 27.0, which is a slight decline since labs 3 weeks ago. CBC: WBC 11.9, mildly improved from 2 weeks ago; PLT 411 increased from 309 2 weeks ago CMP/BMP: GFR 41, consistent with baseline  Imaging Studies: I ordered imaging studies including x-ray hips and pelvis both right and left, CT head, CT cervical spine, CT thoracic spine, CT lumbar spine.   I independently visualized and interpreted imaging which showed  XR hip/pelvis right: Negative for fracture or dislocation XR hip/pelvis left: Negative  for fracture or dislocation CT head: Chronic small-vessel ischemic changes of the cerebral hemispheric white matter and basal ganglia, progressive since 2019.  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 Stranding about a small pseudoaneurysm that arises from  the undersurface of the lateral distal aortic arch at the level of the origin of the subclavian artery on coronal images. Based on appearance thoracic or vascular surgery consultation is suggested particularly if there is chest pain to exclude acute aortic syndrome relating to pseudoaneurysm. Etiology of this finding may relate to underlying penetrating atherosclerotic ulcer based on morphology and presence of similar smaller areas that are seen in the aorta without surrounding stranding.  2. 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  3. Irregular and abundant soft plaque in the thoracic and abdominal aorta could be a risk for distal embolization.  4. Thickening of the ascending and transverse colon may reflect findings of colitis other previous or mild to moderate current colitis. There is no adjacent stranding or free fluid in the pelvis. Correlate with severity of abdominal trauma and consider repeat imaging if there is worsening of symptoms. Would also correlate with any symptoms of colitis or recent colitis.  5. Findings of RIGHT sacral insufficiency fracture.  6. Recent postoperative changes related to lumbar spinal fusion. Small nonspecific fluid in the subcutaneous fat. This potentially represents seroma should be correlated with any worsening symptoms in this location or signs infection.  7.  Increasing biliary duct distension now with moderate intrahepatic biliary duct distension in similar common bile duct distension without clear cause. Correlate with any symptoms of biliary obstruction or RIGHT upper quadrant pain with further imaging on follow-up as warranted I agree with the radiologist interpretation.  ED Course: Pt well-appearing on exam.  Appears uncomfortable but nontoxic, nonseptic, in NAD.  Presenting with multiple bodily complaints after falling about 9 hours ago.  Not on anticoagulation.  Recent lumbar posterior fusion 2 weeks ago.  Complaining of multiple areas of midline tenderness along cervical, thoracic, lumbar spine.  Also complaining of increasing leg pain especially bilateral hip joints, known hx of sciatica.  Patient states this is worse than the sciatica.  Initially denies hitting head, but with head and C-spine tenderness on exam.  Patient reconsiders and believes she may have hit her head, but denies LOC.  Chronic pain managed by pain clinic.  Has not taken any of her normal pain medications today.  Without dizziness, lightheadedness, headache, vision changes, chest pain, shortness of breath.  Plan for imaging for further evaluation. 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  Radiology contacted my attending Dr. Roderic Palau twice regarding this, due to significant concern.  Strongly recommended discussion/consultation with thoracic surgery.  Consultation for thoracic surgery placed. Followed up with secretary regarding consultation status.  Thoracic surgery will come evaluate the patient. Spoke with member from thoracic surgery team after they visit of the patient.  Do not believe the patient is a surgical candidate at this time.  Discussed plans to follow patient with recommended hospital admission and aggressive blood pressure management due to the pseudoaneurysm.  Per patient, her normal systolic blood pressure has averaged 180-190, which considering the  patient's no known history puts the patient at high risk for aortic dissection.  Consultation for critical care placed and esmolol for blood pressure management ordered. Critical care spoke with my attending Dr. Vanita Panda, who informed him that they would accept the patient.  Disposition: After reviewing the patient's encounter today, I feel that the patient would benefit from hospital admission for further treatment and evaluation.    I discussed this case with my attending physician Dr. Vanita Panda, who agreed with the proposed treatment course and cosigned this note  including patient's presenting symptoms, physical exam, and planned diagnostics and interventions.  Attending physician stated agreement with plan or made changes to plan which were implemented.     This chart was dictated using voice recognition software.  Despite best efforts to proofread, errors can occur which can change the documentation meaning.         Final Clinical Impression(s) / ED Diagnoses Final diagnoses:  Pseudoaneurysm ([place])  Fall, initial encounter  Hypertensive emergency    Rx / DC Orders ED Discharge Orders     None         Candace Cruise 09/32/35 1735    Carmin Muskrat, MD 05/14/22 2152

## 2022-05-14 NOTE — Consult Note (Addendum)
[address]       [place] [address]             [phone]        Robin Arellano [facility] Record #[id number] Date of Birth: 01-30-1951  Referring: ED  Primary Care: Sandi Mariscal, MD Primary Cardiologist:Thomas Claiborne Billings, MD  Chief Complaint:    Chief Complaint  Patient presents with   Fall   Back Pain    History of Present Illness:      Robin Arellano is a 71 yo female with history of chronic back pain S/P  L4-L5 Posterior Lumbar Interbody Fusion by Dr. Ellene Route on 6/15,  uncontrolled HTN, and HLD.  The patient presented to the ED after suffering a fall.  She went out to her front porch in the middle of the night and suffered a fall.  She has been having pain in her left leg since that time.  Workup in the ED consisted of hip imaging which was negative for fracture.  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  There was also extensive atherosclerosis present. Due to this finding Cardiothoracic surgery consultation was requested.  She denies chest pain.  She does have pain in her back and is unable to control her left leg.  She remains in significant pain and states the Fentanyl which she received is not helping her.  Her blood pressure is poorly controlled and she states despite medications it consistently runs in the 180s.  She did not take her blood pressure medication this morning. She does have a family history of aneurysm as her father died from an aortic dissection.  She is a former smoker having quit 23 years ago.  She is intolerant of statins stating they have caused her myalgias.    Current Activity/ Functional Status: Patient was independent with mobility/ambulation, transfers, ADL's, IADL's.   Zubrod Score: At the time of surgery this patient's most appropriate activity status/level should be described as: _0     0    Normal activity, no  symptoms _1     1    Restricted in physical strenuous activity but ambulatory, able to do out light work _2     2    Ambulatory and capable of self care, unable to do work activities, up and about                 more than 50%  Of the time                            _3     3    Only limited self care, in bed greater than 50% of waking hours _4     4    Completely disabled, no self care, confined to bed or chair _5     5    Moribund  Past Medical History:  Diagnosis Date   Anxiety    on meds   Back pain    Chronic female pelvic pain    Coronary artery disease    mild, non-obstructive 11/2018   Depression    on meds   Family history of adverse reaction to anesthesia    sister had difficulty waking up   Fibromyalgia    H/O leukocytosis    Headache    Hyperlipidemia    on meds   Hypertension  on meds   MI (myocardial infarction) ([facility])    Pt states she did not have a MI- EKG was normal, was GERD   Osteoarthritis    on meds   Ovarian cyst, right    PONV (postoperative nausea and vomiting)    Post-operative nausea and vomiting    SVD (spontaneous vaginal delivery)    x 2   Vitamin D deficiency     Past Surgical History:  Procedure Laterality Date   ABDOMINAL HYSTERECTOMY  1994   TAH.BSO   ANTERIOR CERVICAL DECOMP/DISCECTOMY FUSION  2019   APPENDECTOMY  1975   BACK SURGERY  2023   BIOPSY  05/12/2020   Procedure: BIOPSY;  Surgeon: Milus Banister, MD;  Location: [facility];  Service: Endoscopy;;   CARDIAC CATHETERIZATION  2020   CHOLECYSTECTOMY N/A 07/21/2020   Procedure: LAPAROSCOPIC CHOLECYSTECTOMY WITH INTRAOPERATIVE CHOLANGIOGRAM;  Surgeon: [name], MD;  Location: [facility];  Service: General;  Laterality: N/A;   COLONOSCOPY  08/12/2017   Hx TA (piecemeal)Jacobs-MAC-suprep (good)   ESOPHAGOGASTRODUODENOSCOPY (EGD) WITH PROPOFOL N/A 05/12/2020   Procedure: ESOPHAGOGASTRODUODENOSCOPY (EGD) WITH PROPOFOL;  Surgeon: Milus Banister, MD;  Location: [facility];  Service:  Endoscopy;  Laterality: N/A;   EUS N/A 05/12/2020   Procedure: UPPER ENDOSCOPIC ULTRASOUND (EUS) RADIAL;  Surgeon: Milus Banister, MD;  Location: [facility];  Service: Endoscopy;  Laterality: N/A;   KNEE SURGERY Bilateral 1996   x 2 - arthroscopic   LEFT HEART CATH AND CORONARY ANGIOGRAPHY N/A 11/25/2018   Procedure: LEFT HEART CATH AND CORONARY ANGIOGRAPHY;  Surgeon: Troy Sine, MD;  Location: [facility];  Service: Cardiovascular;  Laterality: N/A;   PELVIC LAPAROSCOPY  1989   W LYSIS OF ADHESIONS/L SALPINGONEOSTOMY   TUBAL LIGATION     WISDOM TOOTH EXTRACTION      Social History   Tobacco Use  Smoking Status Former   Packs/day: 0.15   Years: 20.00   Total pack years: 3.00   Types: Cigarettes   Quit date: 11/12/1998   Years since quitting: 23.5  Smokeless Tobacco Never    Social History   Substance and Sexual Activity  Alcohol Use Not Currently     Allergies  Allergen Reactions   Flexeril [Cyclobenzaprine] Anaphylaxis, Hives, Itching and Swelling   Zanaflex [Tizanidine] Anaphylaxis, Hives, Itching and Swelling   Latex Rash    No current facility-administered medications for this encounter.   Current Outpatient Medications  Medication Sig Dispense Refill   ALPRAZolam (XANAX) 1 MG tablet Take 0.5 mg by mouth daily as needed for sleep or anxiety.     amLODipine (NORVASC) 10 MG tablet TAKE 1 TABLET(10 MG) BY MOUTH DAILY (Patient taking differently: Take 10 mg by mouth daily.) 90 tablet 3   Ascorbic Acid (VITAMIN C) 1000 MG tablet Take 1,000 mg by mouth in the morning.     aspirin EC 81 MG tablet Take 81 mg by mouth as needed (chest pain). Swallow whole.     Calcium Carbonate Antacid (TUMS PO) Take 2 tablets by mouth daily as needed (stomach pain).     Cholecalciferol (VITAMIN D) 50 MCG (2000 UT) tablet Take 2,000 Units by mouth in the morning.     ezetimibe (ZETIA) 10 MG tablet TAKE 1 TABLET(10 MG) BY MOUTH DAILY (Patient taking differently: Take 10 mg by  mouth daily.) 90 tablet 3   hydrALAZINE (APRESOLINE) 100 MG tablet Take 1 tablet (100 mg total) by mouth 3 (three) times daily. (Patient taking differently: Take 100 mg  by mouth in the morning.) 270 tablet 1   Lidocaine (BLUE-EMU PAIN RELIEF DRY EX) Apply 1 application  topically as needed (pain).     methocarbamol (ROBAXIN) 500 MG tablet Take 1 tablet (500 mg total) by mouth every 6 (six) hours as needed for muscle spasms. 30 tablet 3   methylPREDNISolone (MEDROL DOSEPAK) 4 MG TBPK tablet Take 4 mg by mouth in the morning, at noon, in the evening, and at bedtime.     olmesartan (BENICAR) 40 MG tablet Take 1 tablet (40 mg total) by mouth daily. (Patient taking differently: Take 20 mg by mouth in the morning.) 90 tablet 3   oxyCODONE-acetaminophen (PERCOCET/ROXICET) 5-325 MG tablet Take 1-2 tablets by mouth every 4 (four) hours as needed for moderate pain or severe pain. (Patient taking differently: Take 1 tablet by mouth every 6 (six) hours as needed for moderate pain or severe pain.) 60 tablet 0   valACYclovir (VALTREX) 500 MG tablet TAKE 1 TABLET BY MOUTH TWICE DAILY FOR 3 TO 5 DAYS THEN TAKE DAILY AS NEEDED (Patient taking differently: Take 500 mg by mouth 2 (two) times daily as needed (shingles flair up).) 30 tablet 0   butalbital-acetaminophen-caffeine (FIORICET, ESGIC) 50-325-40 MG tablet Take 1 tablet by mouth daily as needed for headache. (Patient not taking: Reported on 05/14/2022)  0   HYDROcodone-acetaminophen (NORCO) 10-325 MG tablet Take 1 tablet by mouth 3 (three) times daily as needed for moderate pain. (Patient not taking: Reported on 05/14/2022)     zolpidem (AMBIEN) 10 MG tablet Take 10 mg by mouth at bedtime as needed for sleep.  (Patient not taking: Reported on 05/14/2022)      (Not in a hospital admission)   Family History  Problem Relation Age of Onset   Uterine cancer Mother 104   Aneurysm Father    Heart disease Paternal Grandfather    COPD Brother    Skin cancer Brother     Other Sister        MGUS    Skin cancer Brother    Other Sister        MA   Colon cancer Neg Hx    Rectal cancer Neg Hx    Stomach cancer Neg Hx    Stroke Neg Hx    Neuropathy Neg Hx    Colon polyps Neg Hx    Esophageal cancer Neg Hx      Review of Systems:       Cardiac Review of Systems: Y or  [    ]= no  Chest Pain [  N  ]  Resting SOB Aqua.Slicker   ] Exertional SOB  [  ]  Orthopnea [  ]   Pedal Edema [ N  ]    Palpitations [ N ] Syncope  [  ]   Presyncope [   ]  General Review of Systems: [Y] = yes [  ]=no Constitional: recent weight change [  ]; anorexia [  ]; fatigue [  ]; nausea [  ]; night sweats [  ]; fever [  ]; or chills [  ]                                                               Dental: Last Dentist visit:  Eye : blurred vision [  ]; diplopia [   ]; vision changes [  ];  Amaurosis fugax[  ]; Resp: cough [  ];  wheezing[  ];  hemoptysis[N  ]; shortness of breath[  ]; paroxysmal nocturnal dyspnea[  ]; dyspnea on exertion[  ]; or orthopnea[  ];  GI:  gallstones[  ], vomiting[N  ];  dysphagia[  ]; melena[  ];  hematochezia [  ]; heartburn[  ];   Hx of  Colonoscopy[  ]; GU: kidney stones [  ]; hematuria[  ];   dysuria [  ];  nocturia[  ];  history of     obstruction [  ]; urinary frequency [  ]             Skin: rash, swelling[  ];, hair loss[  ];  peripheral edema[ N ];  or itching[  ]; Musculosketetal: myalgias[  ];  joint swelling[  ];  joint erythema[  ];  joint pain[N  ];  back pain[ N ];  Heme/Lymph: bruising[  ];  bleeding[  ];  anemia[  ];  Neuro: TIA[  ];  headaches[ Y ];  stroke[N  ];  vertigo[  ];  seizures[  ];   paresthesias[  ];  difficulty walking[ Y ];  Psych:depression[  ]; anxiety[  ];  Endocrine: diabetes[  ];  thyroid dysfunction[  ]; Physical Exam: BP (!) 193/89   Pulse 70   Temp (!) 97.5 F (36.4 C) (Oral)   Resp (!) 24   Ht _0  (1.6 m)   Wt 60.3 kg   LMP  (LMP Unknown)   SpO2 99%   BMI 23.56 kg/m    General appearance: alert,  cooperative, mild distress, and patient visibly uncomfortable in the bed Head: Normocephalic, without obvious abnormality, atraumatic Resp: clear to auscultation bilaterally Cardio: regular rate and rhythm GI: soft, non-tender; bowel sounds normal; no masses,  no organomegaly Extremities: extremities normal, atraumatic, no cyanosis or edema Neurologic: Grossly normal  Diagnostic Studies & Laboratory data:     Recent Radiology Findings:   CT CHEST ABDOMEN PELVIS W CONTRAST  Addendum Date: 05/14/2022   ADDENDUM REPORT: 05/14/2022 14:45 ADDENDUM: Based on the location of the pseudoaneurysm is near the ligamentum arteriosum the possibility of acute injury to a pre-existing pseudoaneurysm is considered and acute aortic injury is possible. For this reason thoracic or vascular surgery evaluation is suggested as outlined in the initial report. These results were called by telephone at the time of interpretation on 05/14/2022 at 2:45 pm to provider Dr. Roderic Palau, Who verbally acknowledged these results. Dr. Roderic Palau also to the initial call for this patient as outlined in the initial report. Electronically Signed   By: Zetta Bills M.D.   On: 05/14/2022 14:45   Result Date: 05/14/2022 CLINICAL DATA:  A 71 year old female presents for evaluation of trauma post fall. EXAM: CT CHEST, ABDOMEN, AND PELVIS WITH CONTRAST TECHNIQUE: Multidetector CT imaging of the chest, abdomen and pelvis was performed following the standard protocol during bolus administration of intravenous contrast. RADIATION DOSE REDUCTION: This exam was performed according to the departmental dose-optimization program which includes automated exposure control, adjustment of the mA and/or kV according to patient size and/or use of iterative reconstruction technique. CONTRAST:  76m OMNIPAQUE IOHEXOL 350 MG/ML SOLN COMPARISON:  Previous imaging of the abdomen and pelvis. No dedicated imaging of the chest is available for comparison aside from previous  cardiac imaging which was performed in December of 2019. FINDINGS: CT CHEST FINDINGS  Cardiovascular: Calcified and noncalcified atheromatous plaque in the thoracic aorta. Focal outpouching from the aortic arch with subtle surrounding stranding, extending beyond confines of the thoracic aorta measuring 1.3 x 2.0 cm and without prior for comparison. Abundant calcified and noncalcified plaque with areas of marked plaque irregularity grossly similar to previous imaging with respect to imaged portions of the aorta. Second area of suspected penetrating atherosclerotic ulcer or developing pseudoaneurysm noted on image 36/3 but without surrounding stranding. Also seen on a image 59 of series 3 along the superior aspect of the thoracic aorta is a similar focal outpouching also without surrounding stranding. No signs of aortic dissection or additional suspected or potential acute process. Central pulmonary vessels are normal caliber. Mediastinum/Nodes: Mild stranding about the suspected pseudoaneurysm/penetrating ulcer that extends from the LEFT lateral aspect of the thoracic aorta. No thoracic inlet lymphadenopathy. No axillary lymphadenopathy. No mediastinal lymphadenopathy. No hilar lymphadenopathy. Esophagus is grossly normal. Lungs/Pleura: No consolidation. No pleural effusion. No pneumothorax. PICC PICC basilar atelectasis. Airways are patent. Musculoskeletal: No contusion over the body wall. See dedicated musculoskeletal section below for further detail. CT ABDOMEN PELVIS FINDINGS Hepatobiliary: Increasing biliary duct distension since previous imaging from 2021 both intra and extrahepatic biliary duct distension. Transition is at the level of the ampulla. No discrete abnormality is noted to explain these findings. Common bile duct is 14 mm greatest axial dimension which is similar though intrahepatic biliary duct distension is moderate and increased since previous imaging. Pancreas: Pancreatic atrophy without  inflammation or visible lesion. No ductal dilation. Spleen: Normal. Adrenals/Urinary Tract: Adrenal glands are normal. Normal RIGHT kidney. Atrophy of the upper pole the LEFT kidney. No hydronephrosis or suspicious renal lesion. Smooth contour the urinary bladder. Stomach/Bowel: Query thickening of the ascending colon. No adjacent stranding. Sigmoid diverticulosis and diverticular changes without adjacent stranding. Appendix not visualized, no secondary signs to suggest acute appendicitis. No acute gastric or small bowel process. Vascular/Lymphatic: Aortic atherosclerosis both calcified and noncalcified in the abdominal aorta. No surrounding stranding. No aneurysmal dilation of the abdominal aorta. Reproductive: Post hysterectomy.  No adnexal masses. Other: No ascites. Musculoskeletal: Sclerosis along the RIGHT hemi sacrum. This shows a sub chondral bandlike distribution and was not present on previous imaging. The sacroiliac joints are intact. Symphysis pubis is intact. No sign of displaced fracture. No sign of displaced rib fracture.  Sternum is intact. Signs of L4-5 spinal fusion with grade 1 anterolisthesis of L4 on L5. Anterolisthesis new from imaging in 2021 and perhaps mildly increased compared to imaging from December of 2022, not changed compared to recent postoperative imaging and associated with interbody fusion. Small amount of fluid in the subcutaneous fat overlying the surgical site (image 84/3) 3 x 2.2 cm. The IMPRESSION: 1. 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 Based on appearance thoracic or vascular surgery consultation is suggested particularly if there is chest pain to exclude acute aortic syndrome relating to pseudoaneurysm. Etiology of this finding may relate to underlying penetrating atherosclerotic ulcer based on morphology and presence of similar smaller  areas that are seen in the aorta without surrounding stranding. 2. 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 3. Irregular and abundant soft plaque in the thoracic and abdominal aorta could be a risk for distal embolization. 4. Thickening of the ascending and transverse colon may reflect findings of colitis other previous or mild  to moderate current colitis. There is no adjacent stranding or free fluid in the pelvis. Correlate with severity of abdominal trauma and consider repeat imaging if there is worsening of symptoms. Would also correlate with any symptoms of colitis or recent colitis. 5. Findings of RIGHT sacral insufficiency fracture. 6. Recent postoperative changes related to lumbar spinal fusion. Small nonspecific fluid in the subcutaneous fat. This potentially represents seroma should be correlated with any worsening symptoms in this location or signs infection. 7. Increasing biliary duct distension now with moderate intrahepatic biliary duct distension in similar common bile duct distension without clear cause. Correlate with any symptoms of biliary obstruction or RIGHT upper quadrant pain with further imaging on follow-up as warranted. These results were called by telephone at the time of interpretation on 05/14/2022 at 2:26 pm to provider [facility] , who verbally acknowledged these results. Electronically Signed: By: Zetta Bills M.D. On: 05/14/2022 14:26   CT T-SPINE NO CHARGE  Result Date: 05/14/2022 CLINICAL DATA:  Left leg pain after fall. Recent lower back surgery 2 weeks ago. EXAM: CT THORACIC AND LUMBAR SPINE WITHOUT CONTRAST TECHNIQUE: Multidetector CT imaging of the thoracic and lumbar spine was performed without intravenous contrast. Multiplanar CT image reconstructions were also generated. RADIATION DOSE REDUCTION: This exam was performed according to the departmental dose-optimization program which includes automated  exposure control, adjustment of the mA and/or kV according to patient size and/or use of iterative reconstruction technique. COMPARISON:  Lumbar spine x-rays dated Matricia 30, 2023. CT lumbar myelogram dated October 25, 2021. FINDINGS: CT THORACIC SPINE FINDINGS Alignment: Slight dextrocurvature.  No significant listhesis. Vertebrae: No acute fracture or focal pathologic process. Paraspinal and other soft tissues: Please see separate CT chest report from same day. Disc levels: Disc heights are preserved. Scattered facet arthropathy. No significant spinal canal or neuroforaminal stenosis. CT LUMBAR SPINE FINDINGS Segmentation: 5 lumbar type vertebrae. Alignment: No traumatic malalignment. Unchanged 8 mm anterolisthesis at L4-L5. Vertebrae: L4-L5 PLIF. No evidence of hardware failure or loosening. No acute fracture or focal pathologic process. Paraspinal and other soft tissues: Subacute appearing fracture of the right sacral ala with surrounding sclerosis, new since December. Disc levels: Unchanged mild disc bulging at L3-L4 and moderate disc bulging at L5-S1. L4-L5 PLIF with bilateral laminectomies. Unchanged moderate to severe right and mild left facet arthropathy at L5-S1. No significant spinal canal or neuroforaminal stenosis at any level. IMPRESSION: 1. No acute osseous abnormality of the thoracic or lumbar spine. 2. Subacute appearing fracture of the right sacral ala with surrounding sclerosis, new since December. 3. L4-L5 PLIF without evidence of hardware complication. Electronically Signed   By: Titus Dubin M.D.   On: 05/14/2022 14:06   CT L-SPINE NO CHARGE  Result Date: 05/14/2022 CLINICAL DATA:  Left leg pain after fall. Recent lower back surgery 2 weeks ago. EXAM: CT THORACIC AND LUMBAR SPINE WITHOUT CONTRAST TECHNIQUE: Multidetector CT imaging of the thoracic and lumbar spine was performed without intravenous contrast. Multiplanar CT image reconstructions were also generated. RADIATION DOSE REDUCTION:  This exam was performed according to the departmental dose-optimization program which includes automated exposure control, adjustment of the mA and/or kV according to patient size and/or use of iterative reconstruction technique. COMPARISON:  Lumbar spine x-rays dated Jenan 30, 2023. CT lumbar myelogram dated October 25, 2021. FINDINGS: CT THORACIC SPINE FINDINGS Alignment: Slight dextrocurvature.  No significant listhesis. Vertebrae: No acute fracture or focal pathologic process. Paraspinal and other soft tissues: Please see separate CT chest report from same day. Disc  levels: Disc heights are preserved. Scattered facet arthropathy. No significant spinal canal or neuroforaminal stenosis. CT LUMBAR SPINE FINDINGS Segmentation: 5 lumbar type vertebrae. Alignment: No traumatic malalignment. Unchanged 8 mm anterolisthesis at L4-L5. Vertebrae: L4-L5 PLIF. No evidence of hardware failure or loosening. No acute fracture or focal pathologic process. Paraspinal and other soft tissues: Subacute appearing fracture of the right sacral ala with surrounding sclerosis, new since December. Disc levels: Unchanged mild disc bulging at L3-L4 and moderate disc bulging at L5-S1. L4-L5 PLIF with bilateral laminectomies. Unchanged moderate to severe right and mild left facet arthropathy at L5-S1. No significant spinal canal or neuroforaminal stenosis at any level. IMPRESSION: 1. No acute osseous abnormality of the thoracic or lumbar spine. 2. Subacute appearing fracture of the right sacral ala with surrounding sclerosis, new since December. 3. L4-L5 PLIF without evidence of hardware complication. Electronically Signed   By: Titus Dubin M.D.   On: 05/14/2022 14:06   CT CERVICAL SPINE WO CONTRAST  Result Date: 05/14/2022 CLINICAL DATA:  Poly trauma, blunt.  Fell on the porch last night. EXAM: CT CERVICAL SPINE WITHOUT CONTRAST TECHNIQUE: Multidetector CT imaging of the cervical spine was performed without intravenous contrast.  Multiplanar CT image reconstructions were also generated. RADIATION DOSE REDUCTION: This exam was performed according to the departmental dose-optimization program which includes automated exposure control, adjustment of the mA and/or kV according to patient size and/or use of iterative reconstruction technique. COMPARISON:  None FINDINGS: Alignment: Straightening of the normal cervical lordosis. Degenerative type anterolisthesis at C4-5 of 2 mm. Skull base and vertebrae: Distant ACDF C5-C7. No evidence of regional fracture. Soft tissues and spinal canal: No traumatic soft tissue finding. Disc levels: Ordinary osteoarthritis at the C1-2 articulation. C2-3 facet osteoarthritis without significant stenosis. C3-4 facet osteoarthritis and small uncovertebral osteophytes. Foraminal narrowing on the right that could possibly be symptomatic. C4-5 facet arthropathy with 2 mm of degenerative anterolisthesis. Bony foraminal narrowing on the right that could possibly be symptomatic. Solid union from C5 through C7 with wide patency of the canal and foramina. Some chronic bony foraminal narrowing at C5-6, not likely compressive. No significant C7-T1 finding. Upper chest: Negative Other: None IMPRESSION: No acute or traumatic finding. Chronic ACDF C5 through C7 with solid union and sufficient patency of the canal and foramina at those levels. C4-5 facet arthropathy with 2 mm of degenerative anterolisthesis. Bony foraminal narrowing on the right at C3-4 and C4-5. Chronic bony foraminal narrowing at C5-6. Electronically Signed   By: Nelson Chimes M.D.   On: 05/14/2022 13:53   CT HEAD WO CONTRAST  Result Date: 05/14/2022 CLINICAL DATA:  Head trauma, moderate-severe. Fell on the porch last night EXAM: CT HEAD WITHOUT CONTRAST TECHNIQUE: Contiguous axial images were obtained from the base of the skull through the vertex without intravenous contrast. RADIATION DOSE REDUCTION: This exam was performed according to the departmental  dose-optimization program which includes automated exposure control, adjustment of the mA and/or kV according to patient size and/or use of iterative reconstruction technique. COMPARISON:  09/12/2018 FINDINGS: Brain: No focal abnormality seen affecting the brainstem or cerebellum. Cerebral hemispheres show chronic small-vessel ischemic changes throughout the white matter, progressive since 2019. Old appearing small vessel infarctions of the right putamen and left caudate. Question acute/subacute infarction of the medial left parietooccipital junction. No hemorrhage, hydrocephalus or extra-axial collection. Vascular: There is atherosclerotic calcification of the major vessels at the base of the brain. Skull: Negative Sinuses/Orbits: Clear/normal Other: None IMPRESSION: No acute traumatic finding. Chronic small-vessel ischemic changes of  the cerebral hemispheric white matter and basal ganglia, progressive since 2019. Suspicion of small acute cortical infarction in the medial left parietooccipital junction region without evidence of mass effect or hemorrhage. Consider confirmation with MRI. Electronically Signed   By: Nelson Chimes M.D.   On: 05/14/2022 13:48   DG Hip Unilat W or Wo Pelvis 2-3 Views Right  Result Date: 05/14/2022 CLINICAL DATA:  Fall, left lower extremity pain EXAM: DG HIP (WITH OR WITHOUT PELVIS) 2-3V RIGHT COMPARISON:  None Available. FINDINGS: No right hip fracture or dislocation. Small superior right greater trochanter enthesophytes. No significant right hip arthropathy. No suspicious focal osseous lesions. No radiopaque foreign bodies. IMPRESSION: No right hip fracture or dislocation. Electronically Signed   By: Ilona Sorrel M.D.   On: 05/14/2022 12:02   DG Hip Unilat W or Wo Pelvis 2-3 Views Left  Result Date: 05/14/2022 CLINICAL DATA:  Fall, left lower extremity pain EXAM: DG HIP (WITH OR WITHOUT PELVIS) 2-3V LEFT COMPARISON:  None Available. FINDINGS: No pelvic fracture or diastasis. No  left hip fracture or dislocation. No suspicious focal osseous lesions. Bilateral posterior spinal fusion hardware at L4-5. No significant left hip arthropathy. IMPRESSION: No left hip fracture or malalignment. Electronically Signed   By: Ilona Sorrel M.D.   On: 05/14/2022 12:01     I have independently reviewed the above radiologic studies and discussed with the patient   Recent Lab Findings: Lab Results  Component Value Date   WBC 11.9 (H) 05/14/2022   HGB 9.2 (L) 05/14/2022   HCT 27.0 (L) 05/14/2022   PLT 411 (H) 05/14/2022   GLUCOSE 85 05/14/2022   CHOL 208 (H) 01/20/2021   TRIG 122 01/20/2021   HDL 68 01/20/2021   LDLCALC 119 (H) 01/20/2021   ALT 17 05/14/2022   AST 26 05/14/2022   NA 138 05/14/2022   K 4.5 05/14/2022   CL 105 05/14/2022   CREATININE 1.40 (H) 05/14/2022   BUN 24 (H) 05/14/2022   CO2 21 (L) 05/14/2022   TSH 1.070 08/28/2021      Assessment / Plan:      Aortic Pseudoaneurysm, 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 Recommend admission to medicine for BP management... Dr. Prescott Gum to follow up with further recommendations  I  spent 55 minutes counseling the patient face to face.   Ellwood Handler, PA-C 05/14/2022 4:42 PM  Patient examined, images of today's CTA of the thoracic aorta personally reviewed and counseled with patient and husband.  Also previous images of echocardiogram showing normal LV function with normal trileaflet aortic valve and normal  coronary angiogram performed 2019 personally reviewed as well as renal artery ultrasound imaging earlier in 2022 showing no significant  renal artery pathology.  The patient has severe hypertension and hyperlipidemia and has been followed closely by Dr. Claiborne Billings and [facility] for several years.  Blood pressure control has been extremely challenging.  She is felt to have familial hyperlipidemia which has also been very difficult to control.  On Ifeoma 15 she underwent lumbar decompressive laminectomy for severe arthritic spinal degeneration. After falling while trying to turn around on her porch with a rolling walker she was brought to the ED complaining of  low back pain and inability to walk.  Total body CT scan has demonstrated incidental finding of severe atherosclerotic disease of her descending thoracic aorta with extreme mural thickening, calcification, and a small outpouching of the lumen into the mural thickening distal to the left subclavian artery.  The ascending aorta measures 3.8 cm diameter, the descending thoracic aorta measures 2.8 cm diameter.  She currently denies any mid or upper back pain, chest pain but has severe low back pain.  Her blood pressure is 836-725 mm systolic and an esmolol drip is being prepared.  Patient has been tried on statin therapy multiple times but without success.  She is also had difficulty with side effects of her antihypertensive medication.  She stopped smoking 20 years ago.  With severe diffuse atherosclerotic disease of the descending thoracic aorta with mural thickening, small diameter, and asymptomatic ulceration I would not currently recommend aortic surgical invention.  Best approach now is blood pressure control and would ask for review by Dr. Claiborne Billings who knows her very well.    We will also request input from [facility] for further follow-up and evaluation of her descending thoracic aortic disease.  I discussed these issues with the patient and her husband and they understand the plan of care.  Dahlia Byes MD

## 2022-05-14 NOTE — ED Notes (Signed)
Put patient on the bedpan patient has call bell in reach

## 2022-05-14 NOTE — ED Triage Notes (Addendum)
Pt went to front porch at 0200 and fell, husband got patient up but pt has been having pain in L leg since, pain 6/10 currently. Pt had lower back surgery 2 weeks ago. Sees pain management, took muscle relaxer (Robaxin) at 0200, has had nothing since

## 2022-05-14 NOTE — ED Notes (Signed)
ICU RN to call this RN back in about 10 min for report

## 2022-05-14 NOTE — Progress Notes (Signed)
[facility] Progress Note Patient Name: Robin Arellano DOB: 09-01-1951 MRN: [id number]   Date of Service  05/14/2022  HPI/Events of Note  71/F with hypertension, dyslipidemia, fibromyalgia, chronic back pain, recent L4-L5 posterior lumbar fusion who presented after falling backwards on her porch while using her walker overnight last night.  She has severe low back pain.  Due to pan scan for trauma, she was found to have an aortic pseudoaneurysm without evidence of dissection.   Pt started on esmolol gtt for BP control with goal to keep SBP<140, HR <60.   On camera assessment, pt is awake and alert.  BP 157/131, HR 61, RR 21, O2 sats 93% on RA.   eICU Interventions  Hypertensive emergency  Descending aortic pseudoaneurysm Familial hyperlipidemia Chronic pain Leukocytosis  Continue esmolol gtt, hydralazine and amlodipine. Give irbesartan '300mg'$  and clonidine PO starting now for BP control.  Increase frequency of percocet to q4hrs PRN.  Monitor off antibiotics.  SCDs for DVT prophylaxis.        Intervention Category Evaluation Type: New Patient Evaluation  Elsie Lincoln 05/14/2022, 9:46 PM

## 2022-05-14 NOTE — ED Notes (Signed)
Got patient off the bedpan patient is resting with family at bedside and call bell in reach

## 2022-05-14 NOTE — H&P (Signed)
NAME:  Robin Arellano, MRN:  [id number], DOB:  1951/01/15, LOS: 0 ADMISSION DATE:  05/14/2022, CONSULTATION DATE:  05/14/22 REFERRING MD:  Dr. Vanita Panda, CHIEF COMPLAINT:  pseudoaneurysm/ HTN   History of Present Illness:   71 year old female with prior hx as below presenting to ER after sustaining a fall this morning around 2am.  Had recent L4-L5 posterior lumbar fusion with Dr. Ellene Route on 6/15.  Has chronic back pain/ sciatica, which started prior to her surgery.  She got up, states her left leg gave out and she fell backwards.  Since, having her chronic low back pain.  Reports she is compliant taking her 4 or 5 blood pressure pills every day, never misses but did not take them this morning.  Denies any SOB, chest pain, upper back pain, N/V/abd pain, altered sensation other than her normal sciatica.  During workup in ER, patient found to have poorly controlled hypertension with SBP's > 170s and imaging revealed a pseudoaneurysm arising from lateral distal aortic arch.  Cardiothoracic was consulted with recommendations for medical management and esmolol gtt for blood pressure control.  PCCM consulted for ICU admit.   Pertinent  Medical History  Former smoker, CAD, HTN, HLD (reportedly statin intolerant, suspected familial), anxiety, depression, fibromyalgia, chronic back pain/ sciatica, med induced pancreatitis per patient, CKD3b  Of note, patient's father passed away from aortic aneurysm  Significant Hospital Events: Including procedures, antibiotic start and stop dates in addition to other pertinent events   7/3 admitted> esmolol, TCTS consulting   Interim History / Subjective:  C/o her acute on chronic lower back pain and that she has to void (having difficulty with the purwick)  Objective   Blood pressure (!) 193/89, pulse 70, temperature (!) 97.5 F (36.4 C), temperature source Oral, resp. rate (!) 24, height _0  (1.6 m), weight 60.3 kg, SpO2 99 %.       No intake or output data in  the 24 hours ending 05/14/22 1721 Filed Weights   05/14/22 1049  Weight: 60.3 kg   Examination: General:  Older appearing female lying in bed in NAD HEENT: MM pink/moist Neuro:  Alert, oriented x 3, MAE> decreased sensation in LE (unchanged from baseline) CV: rr, NSR in the 70s, equal radial and dp pulses PULM:  non labored, CTA  GI: soft, bs+, tender suprapubic-> has to void Extremities: warm/dry, no LE edema  Skin: no rashes   Resolved Hospital Problem list    Assessment & Plan:   Aortic pseudoaneurysm with severe atherosclerotic disease  - [facility] following, and consulting [facility] - medical management with strict BP control, esmolol for SBP < 120, HR< 60 - further imaging per [facility]/ [facility]  HTN emergency  - tele monitoring  - esmolol as above for SBP< 120, HR< 60  - will add back her home norvasc, zetia, hydralazine, and irbesartan in place of olmesartan - TCTS to get her primary cardiologist, Dr. Claiborne Billings who knows her well to weigh in    CKD3b - stable renal function, monitor closely - serial renal indices, strict I/Os   Chronic lower back pain/ sciatica  - resume home norco prn and oxy IR prn for percocet prn  - robaxin prn  - scheduled bowel regimen  - subacute fracture of sacral new since December    Anemia - Hgb stable from prior admit, trend    Abnormal CTH - showing suspicion of small acute cortical infraction of medial left parietoccipital junction region, w/o evidence of hemorrhage/ mass effect.  Will order MRI brain.  Currently neuro at baseline.  Monitor closely for change in neuro exams with lower blood pressure goals.     HLD - suspected familial, previously intolerant to statins 2/2 myalgias   Best Practice (right click and "Reselect all SmartList Selections" daily)   Diet/type: clear liquids DVT prophylaxis: SCD GI prophylaxis: N/A Lines: N/A Foley:  N/A Code Status:  full code Last date of multidisciplinary goals of care discussion [7/3]  Patient  and husband updated on plan of care.   Labs   CBC: Recent Labs  Lab 05/14/22 1236 05/14/22 1250  WBC 11.9*  --   HGB 9.4* 9.2*  HCT 28.9* 27.0*  MCV 100.7*  --   PLT 411*  --     Basic Metabolic Panel: Recent Labs  Lab 05/14/22 1236 05/14/22 1250  NA 138 138  K 4.5 4.5  CL 108 105  CO2 21*  --   GLUCOSE 87 85  BUN 19 24*  CREATININE 1.38* 1.40*  CALCIUM 8.6*  --    GFR: Estimated Creatinine Clearance: 30.5 mL/min (A) (by C-G formula based on SCr of 1.4 mg/dL (H)). Recent Labs  Lab 05/14/22 1236  WBC 11.9*    Liver Function Tests: Recent Labs  Lab 05/14/22 1236  AST 26  ALT 17  ALKPHOS 124  BILITOT 0.9  PROT 6.2*  ALBUMIN 3.1*   No results for input(s): "LIPASE", "AMYLASE" in the last 168 hours. No results for input(s): "AMMONIA" in the last 168 hours.  ABG    Component Value Date/Time   TCO2 24 05/14/2022 1250     Coagulation Profile: No results for input(s): "INR", "PROTIME" in the last 168 hours.  Cardiac Enzymes: No results for input(s): "CKTOTAL", "CKMB", "CKMBINDEX", "TROPONINI" in the last 168 hours.  HbA1C: No results found for: "HGBA1C"  CBG: No results for input(s): "GLUCAP" in the last 168 hours.  Review of Systems:   Review of Systems  Constitutional:  Negative for chills and fever.  Respiratory:  Negative for shortness of breath.   Cardiovascular:  Positive for chest pain. Negative for leg swelling.  Gastrointestinal:  Negative for abdominal pain, nausea and vomiting.  Musculoskeletal:  Positive for back pain and falls.       Acute on chronic low back pain  Neurological:  Negative for sensory change and focal weakness.    Past Medical History:  She,  has a past medical history of Anxiety, Back pain, Chronic female pelvic pain, Coronary artery disease, Depression, Family history of adverse reaction to anesthesia, Fibromyalgia, H/O leukocytosis, Headache, Hyperlipidemia, Hypertension, MI (myocardial infarction) ([place]),  Osteoarthritis, Ovarian cyst, right, PONV (postoperative nausea and vomiting), Post-operative nausea and vomiting, SVD (spontaneous vaginal delivery), and Vitamin D deficiency.   Surgical History:   Past Surgical History:  Procedure Laterality Date   ABDOMINAL HYSTERECTOMY  1994   TAH.BSO   ANTERIOR CERVICAL DECOMP/DISCECTOMY FUSION  2019   APPENDECTOMY  1975   BACK SURGERY  2023   BIOPSY  05/12/2020   Procedure: BIOPSY;  Surgeon: Milus Banister, MD;  Location: [facility];  Service: Endoscopy;;   CARDIAC CATHETERIZATION  2020   CHOLECYSTECTOMY N/A 07/21/2020   Procedure: LAPAROSCOPIC CHOLECYSTECTOMY WITH INTRAOPERATIVE CHOLANGIOGRAM;  Surgeon: [name], MD;  Location: [facility];  Service: General;  Laterality: N/A;   COLONOSCOPY  08/12/2017   Hx TA (piecemeal)Jacobs-MAC-suprep (good)   ESOPHAGOGASTRODUODENOSCOPY (EGD) WITH PROPOFOL N/A 05/12/2020   Procedure: ESOPHAGOGASTRODUODENOSCOPY (EGD) WITH PROPOFOL;  Surgeon: Milus Banister, MD;  Location: [facility];  Service: Endoscopy;  Laterality: N/A;   EUS N/A 05/12/2020   Procedure: UPPER ENDOSCOPIC ULTRASOUND (EUS) RADIAL;  Surgeon: Milus Banister, MD;  Location: [facility];  Service: Endoscopy;  Laterality: N/A;   KNEE SURGERY Bilateral 1996   x 2 - arthroscopic   LEFT HEART CATH AND CORONARY ANGIOGRAPHY N/A 11/25/2018   Procedure: LEFT HEART CATH AND CORONARY ANGIOGRAPHY;  Surgeon: Troy Sine, MD;  Location: [facility];  Service: Cardiovascular;  Laterality: N/A;   PELVIC LAPAROSCOPY  1989   W LYSIS OF ADHESIONS/L SALPINGONEOSTOMY   TUBAL LIGATION     WISDOM TOOTH EXTRACTION       Social History:   reports that she quit smoking about 23 years ago. Her smoking use included cigarettes. She has a 3.00 pack-year smoking history. She has never used smokeless tobacco. She reports that she does not currently use alcohol. She reports that she does not use drugs.   Family History:  Her family history includes  Aneurysm in her father; COPD in her brother; Heart disease in her paternal grandfather; Other in her sister and sister; Skin cancer in her brother and brother; Uterine cancer (age of onset: 44) in her mother. There is no history of Colon cancer, Rectal cancer, Stomach cancer, Stroke, Neuropathy, Colon polyps, or Esophageal cancer.   Allergies Allergies  Allergen Reactions   Flexeril [Cyclobenzaprine] Anaphylaxis, Hives, Itching and Swelling   Zanaflex [Tizanidine] Anaphylaxis, Hives, Itching and Swelling   Latex Rash     Home Medications  Prior to Admission medications   Medication Sig Start Date End Date Taking? Authorizing Provider  ALPRAZolam Duanne Moron) 1 MG tablet Take 0.5 mg by mouth daily as needed for sleep or anxiety. 03/09/22  Yes [provider]  amLODipine (NORVASC) 10 MG tablet TAKE 1 TABLET(10 MG) BY MOUTH DAILY Patient taking differently: Take 10 mg by mouth daily. 10/30/21  Yes Troy Sine, MD  Ascorbic Acid (VITAMIN C) 1000 MG tablet Take 1,000 mg by mouth in the morning.   Yes [provider]  aspirin EC 81 MG tablet Take 81 mg by mouth as needed (chest pain). Swallow whole.   Yes [provider]  Calcium Carbonate Antacid (TUMS PO) Take 2 tablets by mouth daily as needed (stomach pain).   Yes [provider]  Cholecalciferol (VITAMIN D) 50 MCG (2000 UT) tablet Take 2,000 Units by mouth in the morning.   Yes [provider]  ezetimibe (ZETIA) 10 MG tablet TAKE 1 TABLET(10 MG) BY MOUTH DAILY Patient taking differently: Take 10 mg by mouth daily. 11/29/21  Yes Troy Sine, MD  hydrALAZINE (APRESOLINE) 100 MG tablet Take 1 tablet (100 mg total) by mouth 3 (three) times daily. Patient taking differently: Take 100 mg by mouth in the morning. 10/04/21  Yes Troy Sine, MD  Lidocaine (BLUE-EMU PAIN RELIEF DRY EX) Apply 1 application  topically as needed (pain).   Yes [provider]  methocarbamol (ROBAXIN) 500 MG tablet  Take 1 tablet (500 mg total) by mouth every 6 (six) hours as needed for muscle spasms. 04/27/22  Yes Kristeen Miss, MD  methylPREDNISolone (MEDROL DOSEPAK) 4 MG TBPK tablet Take 4 mg by mouth in the morning, at noon, in the evening, and at bedtime. 05/08/22  Yes [provider]  olmesartan (BENICAR) 40 MG tablet Take 1 tablet (40 mg total) by mouth daily. Patient taking differently: Take 20 mg by mouth in the morning. 08/14/21 08/09/22 Yes Duke, Tami Lin, PA  oxyCODONE-acetaminophen (PERCOCET/ROXICET) 5-325 MG tablet Take 1-2 tablets by mouth every 4 (four) hours as needed for moderate pain or severe pain. Patient taking differently: Take 1 tablet by mouth every 6 (six) hours as needed for moderate pain or severe pain. 04/27/22  Yes Kristeen Miss, MD  valACYclovir (VALTREX) 500 MG tablet TAKE 1 TABLET BY MOUTH TWICE DAILY FOR 3 TO 5 DAYS THEN TAKE DAILY AS NEEDED Patient taking differently: Take 500 mg by mouth 2 (two) times daily as needed (shingles flair up). 12/10/19  Yes Huel Cote, NP  butalbital-acetaminophen-caffeine (FIORICET, ESGIC) 50-325-40 MG tablet Take 1 tablet by mouth daily as needed for headache. Patient not taking: Reported on 05/14/2022 05/09/17   [provider]  HYDROcodone-acetaminophen (NORCO) 10-325 MG tablet Take 1 tablet by mouth 3 (three) times daily as needed for moderate pain. Patient not taking: Reported on 05/14/2022 04/18/20   [provider]  zolpidem (AMBIEN) 10 MG tablet Take 10 mg by mouth at bedtime as needed for sleep.  Patient not taking: Reported on 05/14/2022 01/09/14   [provider]     Critical care time: 40 mins     Kennieth Rad, ACNP [facility] 05/14/2022, 6:34 PM  See Amion for pager If no response to pager, please call PCCM consult pager After 7:00 pm call Elink

## 2022-05-14 NOTE — ED Notes (Signed)
Put patient on bedpan patient has call bell in reach

## 2022-05-14 NOTE — ED Notes (Signed)
Admitting provider at bedside. Pt states medication did not help for pain. Informed pt this nurse is waiting on BP meds from CT

## 2022-05-14 NOTE — ED Notes (Signed)
Pt titrated back too 189mg of Esmolol, MD changed order parameters for HR to be 50-60. PO antihypertensives to be given. MD aware.

## 2022-05-15 ENCOUNTER — Inpatient Hospital Stay (HOSPITAL_COMMUNITY): Payer: PPO

## 2022-05-15 DIAGNOSIS — D649 Anemia, unspecified: Secondary | ICD-10-CM | POA: Diagnosis not present

## 2022-05-15 DIAGNOSIS — I10 Essential (primary) hypertension: Secondary | ICD-10-CM

## 2022-05-15 DIAGNOSIS — I161 Hypertensive emergency: Secondary | ICD-10-CM | POA: Diagnosis not present

## 2022-05-15 DIAGNOSIS — I729 Aneurysm of unspecified site: Secondary | ICD-10-CM | POA: Diagnosis not present

## 2022-05-15 DIAGNOSIS — I712 Thoracic aortic aneurysm, without rupture, unspecified: Secondary | ICD-10-CM

## 2022-05-15 LAB — BASIC METABOLIC PANEL
Anion gap: 9 (ref 5–15)
BUN: 18 mg/dL (ref 8–23)
CO2: 23 mmol/L (ref 22–32)
Calcium: 8.5 mg/dL — ABNORMAL LOW (ref 8.9–10.3)
Chloride: 106 mmol/L (ref 98–111)
Creatinine, Ser: 1.47 mg/dL — ABNORMAL HIGH (ref 0.44–1.00)
GFR, Estimated: 38 mL/min — ABNORMAL LOW (ref >60)
Glucose, Bld: 92 mg/dL (ref 70–99)
Potassium: 4.2 mmol/L (ref 3.5–5.1)
Sodium: 138 mmol/L (ref 135–145)

## 2022-05-15 LAB — GLUCOSE, CAPILLARY
Glucose-Capillary: 85 mg/dL (ref 70–99)
Glucose-Capillary: 82 mg/dL (ref 70–99)
Glucose-Capillary: 78 mg/dL (ref 70–99)
Glucose-Capillary: 88 mg/dL (ref 70–99)
Glucose-Capillary: 101 mg/dL — ABNORMAL HIGH (ref 70–99)
Glucose-Capillary: 100 mg/dL — ABNORMAL HIGH (ref 70–99)

## 2022-05-15 LAB — CBC
HCT: 26.9 % — ABNORMAL LOW (ref 36.0–46.0)
Hemoglobin: 8.7 g/dL — ABNORMAL LOW (ref 12.0–15.0)
MCH: 32.2 pg (ref 26.0–34.0)
MCHC: 32.3 g/dL (ref 30.0–36.0)
MCV: 99.6 fL (ref 80.0–100.0)
Platelets: 375 10*3/uL (ref 150–400)
RBC: 2.7 MIL/uL — ABNORMAL LOW (ref 3.87–5.11)
RDW: 14.6 % (ref 11.5–15.5)
WBC: 9.2 10*3/uL (ref 4.0–10.5)
nRBC: 0 % (ref 0.0–0.2)

## 2022-05-15 MED ORDER — HEPARIN SODIUM (PORCINE) 5000 UNIT/ML IJ SOLN
5000.0000 [IU] | Freq: Three times a day (TID) | INTRAMUSCULAR | Status: DC
  Administered 2022-05-15 – 2022-05-22 (×21): 5000 [IU] via SUBCUTANEOUS
  Filled 2022-05-15 (×21): qty 1

## 2022-05-15 MED ORDER — METHYLPREDNISOLONE 4 MG PO TABS
4.0000 mg | ORAL_TABLET | Freq: Every day | ORAL | Status: AC
  Administered 2022-05-16: 4 mg via ORAL
  Filled 2022-05-15: qty 1

## 2022-05-15 MED ORDER — CHLORHEXIDINE GLUCONATE CLOTH 2 % EX PADS
6.0000 | MEDICATED_PAD | Freq: Every day | CUTANEOUS | Status: DC
  Administered 2022-05-15 – 2022-05-18 (×4): 6 via TOPICAL

## 2022-05-15 MED ORDER — METHYLPREDNISOLONE 4 MG PO TABS
4.0000 mg | ORAL_TABLET | Freq: Two times a day (BID) | ORAL | Status: AC
  Administered 2022-05-15 (×2): 4 mg via ORAL
  Filled 2022-05-15 (×2): qty 1

## 2022-05-15 MED ORDER — LIP MEDEX EX OINT: TOPICAL_OINTMENT | CUTANEOUS | Status: DC | PRN

## 2022-05-15 MED ORDER — ATORVASTATIN CALCIUM 40 MG PO TABS
40.0000 mg | ORAL_TABLET | Freq: Every day | ORAL | Status: DC
Start: 2022-05-15 — End: 2022-05-22
  Administered 2022-05-15 – 2022-05-21 (×7): 40 mg via ORAL
  Filled 2022-05-15 (×7): qty 1

## 2022-05-15 MED ORDER — IOHEXOL 350 MG/ML SOLN
100.0000 mL | Freq: Once | INTRAVENOUS | Status: AC | PRN
  Administered 2022-05-15: 40 mL via INTRAVENOUS

## 2022-05-15 MED ORDER — FENTANYL CITRATE PF 50 MCG/ML IJ SOSY
50.0000 ug | PREFILLED_SYRINGE | Freq: Once | INTRAMUSCULAR | Status: DC
  Filled 2022-05-15: qty 1

## 2022-05-15 MED ORDER — ASPIRIN 81 MG PO CHEW
81.0000 mg | CHEWABLE_TABLET | Freq: Every day | ORAL | Status: DC
  Administered 2022-05-16 – 2022-05-22 (×7): 81 mg via ORAL
  Filled 2022-05-15 (×7): qty 1

## 2022-05-15 MED ORDER — ORAL CARE MOUTH RINSE: 15.0000 mL | OROMUCOSAL | Status: DC | PRN

## 2022-05-15 MED ORDER — STROKE: EARLY STAGES OF RECOVERY BOOK
Freq: Once | Status: AC
Start: 2022-05-16 — End: 2022-05-16
  Filled 2022-05-15: qty 1

## 2022-05-15 MED ORDER — ASPIRIN 81 MG PO CHEW
324.0000 mg | CHEWABLE_TABLET | Freq: Once | ORAL | Status: AC
  Administered 2022-05-15: 324 mg via ORAL
  Filled 2022-05-15: qty 4

## 2022-05-15 NOTE — Consult Note (Signed)
[facility] [place]  ASSESSMENT / PLAN: 71 y.o. 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  These appear to be asymptomatic at the moment.  Would recommend impulse control with systolic blood pressure less than 120 mmHg, and heart rate less than 80 bpm.  Plan to rescan with CT angiogram of chest, abdomen, and pelvis in 48 hours.  Would only consider intervention for rapidly enlarging ulcers, or symptomatic lesions.  CHIEF COMPLAINT: Fall  HISTORY OF PRESENT ILLNESS: Robin Arellano is a 71 y.o. female admitted to the critical care service for hypertensive emergency.  The patient underwent spinal surgery 2 weeks ago.  She has been recovering from this well.  She developed sudden weakness in her left leg causing a mechanical fall.  She presented to the ER for further evaluation.  She has very difficult to control hypertension, requiring up to 7 oral agents.  She may not have perfect compliance with her blood pressure regimen.  She does not presently have any chest or mid scapular back pain.  VASCULAR SURGICAL HISTORY: None  VASCULAR RISK FACTORS: Negative history of stroke / transient ischemic attack. Positive history of coronary artery disease. + history of PCI without stenting (2020). No history of CABG.  Negative history of diabetes mellitus.  Positive history of smoking. Not actively smoking. Positive history of hypertension. 7+ drug regimen with poor control. Negative history of chronic kidney disease.  Negative history of chronic obstructive pulmonary disease.  FUNCTIONAL STATUS: ECOG performance status: (1) Restricted in physically strenuous activity, ambulatory and able to do work of light nature Ambulatory status: Ambulatory within the community with limits  Past Medical History:  Diagnosis Date   Anxiety    on meds   Back pain    Chronic female pelvic pain    Coronary artery disease    mild, non-obstructive  11/2018   Depression    on meds   Family history of adverse reaction to anesthesia    sister had difficulty waking up   Fibromyalgia    H/O leukocytosis    Headache    Hyperlipidemia    on meds   Hypertension    on meds   MI (myocardial infarction) ([place])    Pt states she did not have a MI- EKG was normal, was GERD   Osteoarthritis    on meds   Ovarian cyst, right    PONV (postoperative nausea and vomiting)    Post-operative nausea and vomiting    SVD (spontaneous vaginal delivery)    x 2   Vitamin D deficiency     Past Surgical History:  Procedure Laterality Date   ABDOMINAL HYSTERECTOMY  1994   TAH.BSO   ANTERIOR CERVICAL DECOMP/DISCECTOMY FUSION  2019   APPENDECTOMY  1975   BACK SURGERY  2023   BIOPSY  05/12/2020   Procedure: BIOPSY;  Surgeon: Milus Banister, MD;  [facility]: [facility];  Service: Endoscopy;;   CARDIAC CATHETERIZATION  2020   CHOLECYSTECTOMY N/A 07/21/2020   Procedure: LAPAROSCOPIC CHOLECYSTECTOMY WITH INTRAOPERATIVE CHOLANGIOGRAM;  Surgeon: [name], MD;  Location: [facility];  Service: General;  Laterality: N/A;   COLONOSCOPY  08/12/2017   Hx TA (piecemeal)Jacobs-MAC-suprep (good)   ESOPHAGOGASTRODUODENOSCOPY (EGD) WITH PROPOFOL N/A 05/12/2020   Procedure: ESOPHAGOGASTRODUODENOSCOPY (EGD) WITH PROPOFOL;  Surgeon: Milus Banister, MD;  [facility];  Service: Endoscopy;  Laterality: N/A;   EUS N/A 05/12/2020   Procedure: UPPER ENDOSCOPIC ULTRASOUND (EUS) RADIAL;  Surgeon: Milus Banister, MD;  [facility];  Service: Endoscopy;  Laterality: N/A;   KNEE SURGERY Bilateral 1996   x 2 - arthroscopic   LEFT HEART CATH AND CORONARY ANGIOGRAPHY N/A 11/25/2018   Procedure: LEFT HEART CATH AND CORONARY ANGIOGRAPHY;  Surgeon: Troy Sine, MD;  Location: [facility];  Service: Cardiovascular;  Laterality: N/A;   PELVIC LAPAROSCOPY  1989   W LYSIS OF ADHESIONS/L SALPINGONEOSTOMY   TUBAL LIGATION     WISDOM TOOTH EXTRACTION       Family History  Problem Relation Age of Onset   Uterine cancer Mother 51   Aneurysm Father    Heart disease Paternal Grandfather    COPD Brother    Skin cancer Brother    Other Sister        MGUS    Skin cancer Brother    Other Sister        MA   Colon cancer Neg Hx    Rectal cancer Neg Hx    Stomach cancer Neg Hx    Stroke Neg Hx    Neuropathy Neg Hx    Colon polyps Neg Hx    Esophageal cancer Neg Hx     Social History   Socioeconomic History   Marital status: Married    Spouse name: don   Number of children: 2   Years of education: College   Highest education level: Not on file  Occupational History   Occupation: Realtor  Tobacco Use   Smoking status: Former    Packs/day: 0.15    Years: 20.00    Total pack years: 3.00    Types: Cigarettes    Quit date: 11/12/1998    Years since quitting: 23.5   Smokeless tobacco: Never  Vaping Use   Vaping Use: Never used  Substance and Sexual Activity   Alcohol use: Not Currently   Drug use: No   Sexual activity: Not Currently    Birth control/protection: Post-menopausal, Surgical    Comment: HYSTERECTOMY  Other Topics Concern   Not on file  Social History Narrative   Lives at home with her husband Don   Right handed   Caffeine: unsweet tea, 2 glasses daily   Social Determinants of Health   Financial Resource Strain: Not on file  Food Insecurity: Not on file  Transportation Needs: Not on file  Physical Activity: Not on file  Stress: Not on file  Social Connections: Not on file  Intimate Partner Violence: Not on file    Allergies  Allergen Reactions   Flexeril [Cyclobenzaprine] Anaphylaxis, Hives, Itching and Swelling   Zanaflex [Tizanidine] Anaphylaxis, Hives, Itching and Swelling   Latex Rash    Current Facility-Administered Medications  Medication Dose Route Frequency Provider Last Rate Last Admin   0.9 %  sodium chloride infusion (Manually program via Guardrails IV Fluids)   Intravenous Once Julian Hy, DO       ALPRAZolam Duanne Moron) tablet 0.5 mg  0.5 mg Oral Daily PRN Jennelle Human B, NP   0.5 mg at 05/14/22 2335   amLODipine (NORVASC) tablet 10 mg  10 mg Oral Daily Jennelle Human B, NP   10 mg at 05/15/22 7673   Chlorhexidine Gluconate Cloth 2 % PADS 6 each  6 each Topical Daily Noemi Chapel P, DO       cloNIDine (CATAPRES) tablet 0.3 mg  0.3 mg Oral BID Elsie Lincoln, MD   0.3 mg at 05/15/22 0907   docusate sodium (COLACE) capsule 100 mg  100 mg  Oral Daily Julian Hy, DO   100 mg at 05/15/22 1740   esmolol (BREVIBLOC) 2000 mg / 100 mL (20 mg/mL) infusion  25-300 mcg/kg/min Intravenous Continuous Elsie Lincoln, MD   Stopped at 05/14/22 2359   ezetimibe (ZETIA) tablet 10 mg  10 mg Oral Daily Jennelle Human B, NP   10 mg at 05/15/22 8144   hydrALAZINE (APRESOLINE) tablet 100 mg  100 mg Oral TID Elsie Lincoln, MD   100 mg at 05/15/22 0503   insulin aspart (novoLOG) injection 1-3 Units  1-3 Units Subcutaneous Q4H Noemi Chapel P, DO       irbesartan (AVAPRO) tablet 300 mg  300 mg Oral Daily Elsie Lincoln, MD   300 mg at 05/15/22 8185   lip balm (CARMEX) ointment   Topical PRN Noemi Chapel P, DO       methocarbamol (ROBAXIN) tablet 500 mg  500 mg Oral Q6H PRN Jennelle Human B, NP   500 mg at 05/15/22 0411   Oral care mouth rinse  15 mL Mouth Rinse PRN Noemi Chapel P, DO       oxyCODONE-acetaminophen (PERCOCET/ROXICET) 5-325 MG per tablet 1-2 tablet  1-2 tablet Oral Q4H PRN Elsie Lincoln, MD   2 tablet at 05/15/22 0515   polyethylene glycol (MIRALAX / GLYCOLAX) packet 17 g  17 g Oral Daily Noemi Chapel P, DO   17 g at 05/15/22 6314   senna (SENOKOT) tablet 8.6 mg  1 tablet Oral QHS PRN Julian Hy, DO        PHYSICAL EXAM Vitals:   05/15/22 0700 05/15/22 0730 05/15/22 0800 05/15/22 0900  BP: 123/61 (!) 118/55 (!) 134/57 (!) 144/57  Pulse: (!) 56 (!) 49 (!) 49 (!) 53  Resp: _0 Temp:  97.8 F (36.6 C)    TempSrc:  Oral    SpO2: 92% 92% 90% 94%  Weight:      Height:         Constitutional: well appearing. no distress.  Cardiac: regular rate and rhythm.  Respiratory:  unlabored. Abdominal:  soft, non-tender, non-distended.  Peripheral vascular: 2+ radial pulses  2+ femoral pulses  No palpable pedal pulses  PERTINENT LABORATORY AND RADIOLOGIC DATA  Most recent CBC    Latest Ref Rng & Units 05/15/2022   12:45 AM 05/14/2022   12:50 PM 05/14/2022   12:36 PM  CBC  WBC 4.0 - 10.5 K/uL 9.2   11.9   Hemoglobin 12.0 - 15.0 g/dL 8.7  9.2  9.4   Hematocrit 36.0 - 46.0 % 26.9  27.0  28.9   Platelets 150 - 400 K/uL 375   411      Most recent CMP    Latest Ref Rng & Units 05/15/2022   12:45 AM 05/14/2022   12:50 PM 05/14/2022   12:36 PM  CMP  Glucose 70 - 99 mg/dL 92  85  87   BUN 8 - 23 mg/dL _1 Creatinine 0.44 - 1.00 mg/dL 1.47  1.40  1.38   Sodium 135 - 145 mmol/L 138  138  138   Potassium 3.5 - 5.1 mmol/L 4.2  4.5  4.5   Chloride 98 - 111 mmol/L 106  105  108   CO2 22 - 32 mmol/L 23   21   Calcium 8.9 - 10.3 mg/dL 8.5   8.6   Total Protein 6.5 - 8.1 g/dL   6.2   Total Bilirubin 0.3 - 1.2 mg/dL  0.9   Alkaline Phos 38 - 126 U/L   124   AST 15 - 41 U/L   26   ALT 0 - 44 U/L   17     Renal function Estimated Creatinine Clearance: 33 mL/min (A) (by C-G formula based on SCr of 1.47 mg/dL (H)).  No results found for: "HGBA1C"  LDL Chol Calc (NIH)  Date Value Ref Range Status  01/20/2021 119 (H) 0 - 99 mg/dL Final     Vascular Imaging: CT scan of the chest, abdomen, and pelvis with venous phase contrast personally reviewed.  There are 2 small penetrating atherosclerotic ulcers in the descending thoracic aorta.  1 is immediately distal to the left subclavian artery, and measures 2 cm in largest diameter.  More distally in the mid descending thoracic aorta there is a smaller outpouching consistent with PAU which measures less than a centimeter.  [name]. Stanford Breed, MD [facility] Phone Number: [phone] 05/15/2022 9:44 AM  Total time spent on preparing this encounter including chart review, data review, collecting history, examining the patient, coordinating care for this new patient, 60 minutes.  Portions of this report may have been transcribed using voice recognition software.  Every effort has been made to ensure accuracy; however, inadvertent computerized transcription errors may still be present.

## 2022-05-15 NOTE — Evaluation (Signed)
Speech Language Pathology Evaluation Patient Details Name: Robin Arellano MRN: [id number] DOB: [birth year] Today's Date: 05/15/2022 Time: 1410-1420 SLP Time Calculation (min) (ACUTE ONLY): 10 min  Problem List:  Patient Active Problem List   Diagnosis Date Noted   Pseudoaneurysm ([place]) 05/14/2022   Spondylolisthesis at L4-L5 level 04/26/2022   Hyperlipidemia 11/25/2020   Hypertension 07/11/2020   Common bile duct dilation    Acute pancreatitis 04/01/2020   Anxiety    Hypokalemia    Mild renal insufficiency    Elevated troponin    Hypertensive urgency    Elevated liver enzymes    Abnormal magnetic resonance imaging of abdomen    Coronary artery calcification of native artery    Chest pain    Herpes zoster without complication 40/98/1191   Abdominal pain 02/07/2018   Abdominal pain, epigastric 02/07/2018   Low back pain 07/09/2013   Neck pain 07/09/2013   Headache(784.0) 07/09/2013   Fibromyalgia 07/09/2013   Past Medical History:  Past Medical History:  Diagnosis Date   Anxiety    on meds   Back pain    Chronic female pelvic pain    Coronary artery disease    mild, non-obstructive 11/2018   Depression    on meds   Family history of adverse reaction to anesthesia    sister had difficulty waking up   Fibromyalgia    H/O leukocytosis    Headache    Hyperlipidemia    on meds   Hypertension    on meds   MI (myocardial infarction) ([place])    Pt states she did not have a MI- EKG was normal, was GERD   Osteoarthritis    on meds   Ovarian cyst, right    PONV (postoperative nausea and vomiting)    Post-operative nausea and vomiting    SVD (spontaneous vaginal delivery)    x 2   Vitamin D deficiency    Past Surgical History:  Past Surgical History:  Procedure Laterality Date   ABDOMINAL HYSTERECTOMY  1994   TAH.BSO   ANTERIOR CERVICAL DECOMP/DISCECTOMY FUSION  2019   APPENDECTOMY  1975   BACK SURGERY  2023   BIOPSY  05/12/2020   Procedure: BIOPSY;  Surgeon:  Milus Banister, MD;  [facility];  Service: Endoscopy;;   CARDIAC CATHETERIZATION  2020   CHOLECYSTECTOMY N/A 07/21/2020   Procedure: LAPAROSCOPIC CHOLECYSTECTOMY WITH INTRAOPERATIVE CHOLANGIOGRAM;  Surgeon: [name], MD;  Location: [facility];  Service: General;  Laterality: N/A;   COLONOSCOPY  08/12/2017   Hx TA (piecemeal)Jacobs-MAC-suprep (good)   ESOPHAGOGASTRODUODENOSCOPY (EGD) WITH PROPOFOL N/A 05/12/2020   Procedure: ESOPHAGOGASTRODUODENOSCOPY (EGD) WITH PROPOFOL;  Surgeon: Milus Banister, MD;  [facility];  Service: Endoscopy;  Laterality: N/A;   EUS N/A 05/12/2020   Procedure: UPPER ENDOSCOPIC ULTRASOUND (EUS) RADIAL;  Surgeon: Milus Banister, MD;  [facility];  Service: Endoscopy;  Laterality: N/A;   KNEE SURGERY Bilateral 1996   x 2 - arthroscopic   LEFT HEART CATH AND CORONARY ANGIOGRAPHY N/A 11/25/2018   Procedure: LEFT HEART CATH AND CORONARY ANGIOGRAPHY;  Surgeon: Troy Sine, MD;  Location: [place] [facility];  Service: Cardiovascular;  Laterality: N/A;   PELVIC LAPAROSCOPY  1989   W LYSIS OF ADHESIONS/L SALPINGONEOSTOMY   TUBAL LIGATION     WISDOM TOOTH EXTRACTION     HPI:  Patient is a 71 y.o. female with PMH: HTN, CAD, HLD, CKD, former smoker, depression, fibromyalgia, chronic back pain, recent L4-5 posterior lumbar fusion who  presented to the hospital on 05/14/22 after falling backwards on her porch while using her walker. CT head revealed suspicious acute left infarct and MRI confirmed acute/subacute left PCA infarct.   Assessment / Plan / Recommendation Clinical Impression  Patient presents with what appears to be mild cognitive impairment as per this evaluation, however plan on continuing evaluation of her cognition during future treatment sessions when patient is less fatigued. SLP did not observe anything significant during oral motor examination with no assymetry or weakness of oral motor musculature. Speech was fully  intelligible with no dysarthria. Expressive and receptive language function were both WNL. SLP administered portion of the SLUMS but patient became very sleepy and unable to complete. She was oriented x4 but with delays in responses at times such as saying "May 16, 1999....Marland Kitchen" and requiring SLP to cue her '2000 and what?' She recalled 3 of 5 words after 2 minute delay. SLP plans to complete cognitive assessment during next treatment session to determine if further intervention will be needed.    SLP Assessment  SLP Recommendation/Assessment: Patient needs continued Speech [place] [facility] SLP Visit Diagnosis: Cognitive communication deficit (R41.841)    Recommendations for follow up therapy are one component of a multi-disciplinary discharge planning process, led by the attending physician.  Recommendations may be updated based on patient status, additional functional criteria and insurance authorization.    Follow Up Recommendations  No SLP follow up    Assistance Recommended at Discharge  Frequent or constant Supervision/Assistance  Functional Status Assessment Patient has had a recent decline in their functional status and demonstrates the ability to make significant improvements in function in a reasonable and predictable amount of time.  Frequency and Duration min 1 x/week  1 week      SLP Evaluation Cognition  Overall Cognitive Status: Impaired/Different from baseline Arousal/Alertness: Awake/alert Orientation Level: Oriented X4 Year: 2023 Month: July Day of Week: Correct Memory: Impaired Memory Impairment: Retrieval deficit;Storage deficit Awareness: Impaired Awareness Impairment: Anticipatory impairment Problem Solving: Impaired Problem Solving Impairment: Verbal complex       Comprehension  Auditory Comprehension Overall Auditory Comprehension: Appears within functional limits for tasks assessed    Expression Expression Primary Mode of Expression:  Verbal Verbal Expression Overall Verbal Expression: Appears within functional limits for tasks assessed   Oral / Motor  Oral Motor/Sensory Function Overall Oral Motor/Sensory Function: Within functional limits Motor Speech Overall Motor Speech: Appears within functional limits for tasks assessed Respiration: Within functional limits Resonance: Within functional limits Articulation: Within functional limitis Intelligibility: [place], MA, CCC-SLP Speech Therapy

## 2022-05-15 NOTE — Progress Notes (Signed)
NAME:  Robin Arellano, MRN:  [id number], DOB:  [birth year], LOS: 1 ADMISSION DATE:  05/14/2022, CONSULTATION DATE:  05/14/22 REFERRING MD:  Dr. Vanita Panda, CHIEF COMPLAINT:  pseudoaneurysm/ HTN   History of Present Illness:   71 year old female with prior hx as below presenting to ER after sustaining a fall this morning around 2am.  Had recent L4-L5 posterior lumbar fusion with Dr. Ellene Route on 6/15.  Has chronic back pain/ sciatica, which started prior to her surgery.  She got up, states her left leg gave out and she fell backwards.  Since, having her chronic low back pain.  Reports she is compliant taking her 4 or 5 blood pressure pills every day, never misses but did not take them this morning.  Denies any SOB, chest pain, upper back pain, N/V/abd pain, altered sensation other than her normal sciatica.  During workup in ER, patient found to have poorly controlled hypertension with SBP's > 170s and imaging revealed a pseudoaneurysm arising from lateral distal aortic arch.  Cardiothoracic was consulted with recommendations for medical management and esmolol gtt for blood pressure control.  PCCM consulted for ICU admit.   Pertinent  Medical History  Former smoker, CAD, HTN, HLD (reportedly statin intolerant, suspected familial), anxiety, depression, fibromyalgia, chronic back pain/ sciatica, med induced pancreatitis per patient, CKD3b  Of note, patient's father passed away from aortic aneurysm.  Significant Hospital Events: Including procedures, antibiotic start and stop dates in addition to other pertinent events   7/3 admitted> esmolol, TCTS consulting  7/4  brain MRI- subacute CVA  Interim History / Subjective:  Still having significant back pain. Today she admits she has been having R eye vision loss and left foot weakness since her back surgery about 2 weeks ago. She denies new complaints.   Objective   Blood pressure (!) 134/57, pulse (!) 49, temperature 97.8 F (36.6 C), temperature  source Oral, resp. rate 14, height '5\' 3"'$  (1.6 m), weight 70.1 kg, SpO2 90 %.        Intake/Output Summary (Last 24 hours) at 05/15/2022 0845 Last data filed at 05/15/2022 0737 Gross per 24 hour  Intake 370.23 ml  Output --  Net 370.23 ml   Filed Weights   05/14/22 1049 05/15/22 0357  Weight: 60.3 kg 70.1 kg   Examination: General:  chronically ill appearing woman sitting up in the recliner HEENT: [place]/AT, eyes anicteric Neuro:  Awake, alert, moving all extremities. Needs significant assistance with getting from recliner back to bed. Moving all extremities. Answering questions appropriately, normal speech.  CV: S1S2, RRR PULM:  breathing comfortably on [place], no wheezing. CTAB. GI: soft, NT Extremities: warm/dry, no LE edema  Skin: warm, dry, no rashes, pallor  BUN 18 Cr 1.47 H/H 8.7/26.9  MRI brain: subacute CVA  Resolved Hospital Problem list    Assessment & Plan:   Aortic pseudoaneurysm with severe atherosclerotic disease  - Vascular surgery consult today- planning for follow up imaging in 2 days and con't BP control -esmolol for goal SBP <120, HR <80 -con't ICU monitoring -control pain to help moderate BPs  HTN emergency  -resume PTA amlodipine, hydralazine, sub in irbesartan for olmesartan -con't clonidine BID -esmolol to maintain SBP <120 and HR<60  HLD, likely familial HLD -con't PTA zetia -atorvastatin '40mg'$  daily; previously has intolerance 2/2 myalgias so may again not tolerate  Subacute CVA L occipital> explains her R sided vision loss 2 weeks ago -aspirin, statin -Neuro consult -check A1c, repeat lipid panel, but has long-standing history  of hyperlipidemia -CTA H&N  -tele -PT, OT, SLP. Bedside swallow screen.   ZOX0R -strict I/O -renally dose meds, avoid nephrotoxic meds -monitor  Chronic lower back pain/ sciatica; recent L4-5 fusion Subacute fracture of sacral ala, new since December Chronic opiate use -con't PTA norco and oxycodone resume home  norco prn and oxy IR prn for percocet prn  - con't robaxin PRN -can resume PTA steroid taper from NS - scheduled bowel regimen   Hyperglycemia -SSI PRN -check A1c -goal BG 140-180 if requiring insulin  Chronic anemia -transfuse for hb <7 or hemodynamically significant bleeding   Best Practice (right click and "Reselect all SmartList Selections" daily)   Diet/type: Regular consistency (see orders) DVT prophylaxis: prophylactic heparin  GI prophylaxis: N/A Lines: N/A Foley:  N/A Code Status:  full code Last date of multidisciplinary goals of care discussion [7/3]   Labs   CBC: Recent Labs  Lab 05/14/22 1236 05/14/22 1250 05/15/22 0045  WBC 11.9*  --  9.2  HGB 9.4* 9.2* 8.7*  HCT 28.9* 27.0* 26.9*  MCV 100.7*  --  99.6  PLT 411*  --  375     Basic Metabolic Panel: Recent Labs  Lab 05/14/22 1236 05/14/22 1250 05/15/22 0045  NA 138 138 138  K 4.5 4.5 4.2  CL 108 105 106  CO2 21*  --  23  GLUCOSE 87 85 92  BUN 19 24* 18  CREATININE 1.38* 1.40* 1.47*  CALCIUM 8.6*  --  8.5*    GFR: Estimated Creatinine Clearance: 33 mL/min (A) (by C-G formula based on SCr of 1.47 mg/dL (H)). Recent Labs  Lab 05/14/22 1236 05/15/22 0045  WBC 11.9* 9.2    This patient is critically ill with multiple organ system failure which requires frequent high complexity decision making, assessment, support, evaluation, and titration of therapies. This was completed through the application of advanced monitoring technologies and extensive interpretation of multiple databases. During this encounter critical care time was devoted to patient care services described in this note for 40 minutes.   Julian Hy, DO 05/15/22 4:10 PM [place] [facility]

## 2022-05-15 NOTE — Consult Note (Addendum)
Neurology Consultation  Reason for Consult: Stroke on MRI Referring Physician: Dr. Carlis Abbott  CC: fall  History is obtained from:patient and medical record  HPI: Robin Arellano is a 71 y.o. female with past medical history of fibromyalgia, chronic back pain, s/p recent L4-L5 posterior lumbar fusion, CAD, HTN, HLD with statin intolerance, anxiety, depression, CKD and former smoker who presents 7/3 after sustaining a fall in which her left leg gave out causing her to fall backwards. CT head revealed suspicious acute left infarct. MRI brain confirmed acute/subacute left PCA infarct. Neurology consulted for stroke workup    LKW: PTA IV thrombolysis given?: no, outside window  Premorbid modified Rankin scale (mRS):  0-Completely asymptomatic and back to baseline post-stroke   ROS: Full ROS was performed and is negative except as noted in the HPI.    Past Medical History:  Diagnosis Date   Anxiety    on meds   Back pain    Chronic female pelvic pain    Coronary artery disease    mild, non-obstructive 11/2018   Depression    on meds   Family history of adverse reaction to anesthesia    sister had difficulty waking up   Fibromyalgia    H/O leukocytosis    Headache    Hyperlipidemia    on meds   Hypertension    on meds   MI (myocardial infarction) ([place])    Pt states she did not have a MI- EKG was normal, was GERD   Osteoarthritis    on meds   Ovarian cyst, right    PONV (postoperative nausea and vomiting)    Post-operative nausea and vomiting    SVD (spontaneous vaginal delivery)    x 2   Vitamin D deficiency      Family History  Problem Relation Age of Onset   Uterine cancer Mother 54   Aneurysm Father    Heart disease Paternal Grandfather    COPD Brother    Skin cancer Brother    Other Sister        MGUS    Skin cancer Brother    Other Sister        MA   Colon cancer Neg Hx    Rectal cancer Neg Hx    Stomach cancer Neg Hx    Stroke Neg Hx    Neuropathy  Neg Hx    Colon polyps Neg Hx    Esophageal cancer Neg Hx      Social History:   reports that she quit smoking about 23 years ago. Her smoking use included cigarettes. She has a 3.00 pack-year smoking history. She has never used smokeless tobacco. She reports that she does not currently use alcohol. She reports that she does not use drugs.  Medications  Current Facility-Administered Medications:    0.9 %  sodium chloride infusion (Manually program via Guardrails IV Fluids), , Intravenous, Once, Julian Hy, DO   ALPRAZolam Duanne Moron) tablet 0.5 mg, 0.5 mg, Oral, Daily PRN, Jennelle Human B, NP, 0.5 mg at 05/14/22 2335   amLODipine (NORVASC) tablet 10 mg, 10 mg, Oral, Daily, Jennelle Human B, NP, 10 mg at 05/15/22 7494   Chlorhexidine Gluconate Cloth 2 % PADS 6 each, 6 each, Topical, Daily, Julian Hy, DO, 6 each at 05/15/22 1007   cloNIDine (CATAPRES) tablet 0.3 mg, 0.3 mg, Oral, BID, Elsie Lincoln, MD, 0.3 mg at 05/15/22 0907   docusate sodium (COLACE) capsule 100 mg, 100 mg, Oral, Daily, Julian Hy,  DO, 100 mg at 05/15/22 0907   esmolol (BREVIBLOC) 2000 mg / 100 mL (20 mg/mL) infusion, 25-300 mcg/kg/min, Intravenous, Continuous, Elsie Lincoln, MD, Last Rate: 4.52 mL/hr at 05/15/22 1007, 25 mcg/kg/min at 05/15/22 1007   ezetimibe (ZETIA) tablet 10 mg, 10 mg, Oral, Daily, Jennelle Human B, NP, 10 mg at 05/15/22 5284   fentaNYL (SUBLIMAZE) injection 50 mcg, 50 mcg, Intravenous, Once, Carlis Abbott, Laura P, DO   hydrALAZINE (APRESOLINE) tablet 100 mg, 100 mg, Oral, TID, Elsie Lincoln, MD, 100 mg at 05/15/22 0503   insulin aspart (novoLOG) injection 1-3 Units, 1-3 Units, Subcutaneous, Q4H, Clark, Laura P, DO   irbesartan (AVAPRO) tablet 300 mg, 300 mg, Oral, Daily, Elsie Lincoln, MD, 300 mg at 05/15/22 1324   lip balm (CARMEX) ointment, , Topical, PRN, Julian Hy, DO   methocarbamol (ROBAXIN) tablet 500 mg, 500 mg, Oral, Q6H PRN, Jennelle Human B, NP, 500 mg at 05/15/22 1020    methylPREDNISolone (MEDROL) tablet 4 mg, 4 mg, Oral, BID, Julian Hy, DO   [START ON 05/16/2022] methylPREDNISolone (MEDROL) tablet 4 mg, 4 mg, Oral, Daily, Julian Hy, DO   Oral care mouth rinse, 15 mL, Mouth Rinse, PRN, Julian Hy, DO   oxyCODONE-acetaminophen (PERCOCET/ROXICET) 5-325 MG per tablet 1-2 tablet, 1-2 tablet, Oral, Q4H PRN, Elsie Lincoln, MD, 2 tablet at 05/15/22 0515   polyethylene glycol (MIRALAX / GLYCOLAX) packet 17 g, 17 g, Oral, Daily, Julian Hy, DO, 17 g at 05/15/22 4010   senna (SENOKOT) tablet 8.6 mg, 1 tablet, Oral, QHS PRN, Julian Hy, DO   Exam: Current vital signs: BP (!) 148/60   Pulse (!) 55   Temp 97.8 F (36.6 C) (Oral)   Resp 15   Ht '5\' 3"'$  (1.6 m)   Wt 70.1 kg   LMP  (LMP Unknown)   SpO2 95%   BMI 27.38 kg/m  Vital signs in last 24 hours: Temp:  [97.8 F (36.6 C)-98.5 F (36.9 C)] 97.8 F (36.6 C) (07/04 0730) Pulse Rate:  [49-70] 55 (07/04 1015) Resp:  [9-27] 15 (07/04 1015) BP: (98-208)/(46-158) 148/60 (07/04 1015) SpO2:  [90 %-99 %] 95 % (07/04 1015) Weight:  [70.1 kg] 70.1 kg (07/04 0357)  GENERAL: drowsy in NAD HEENT: - Normocephalic and atraumatic, dry mm LUNGS - Clear to auscultation bilaterally with no wheezes CV - S1S2 RRR, no m/r/g, equal pulses bilaterally. ABDOMEN - Soft, nontender, nondistended with normoactive BS Ext: warm, well perfused, intact peripheral pulses, no edema  NEURO:  Mental Status: AA&Ox3  Language: speech is clear, slow.  Naming, repetition, fluency, and comprehension intact. Cranial Nerves: PERRL, EOMI, visual fields left hemianopia, no facial asymmetry, facial sensation intact, hearing intact, tongue/uvula/soft palate midline, normal sternocleidomastoid and trapezius muscle strength. No evidence of tongue atrophy or fibrillations Motor: generalized weakness, bilateral uppers 4/5, bilateral lowers 4/5 Tone: is normal and bulk is normal Sensation- Intact to light touch  bilaterally Coordination: FTN intact bilaterally, no ataxia in BLE. Gait- deferred  NIHSS 1a Level of Conscious.: 1 1b LOC Questions: 0 1c LOC Commands: 0 2 Best Gaze: 0 3 Visual: 1 4 Facial Palsy: 0 5a Motor Arm - left: 0 5b Motor Arm - Right: 0 6a Motor Leg - Left: 1 6b Motor Leg - Right: 1 7 Limb Ataxia: 0 8 Sensory: 0 9 Best Language: 0 10 Dysarthria: 0 11 Extinct. and Inatten.: 0 TOTAL: 4   Imaging I have reviewed the images obtained:  CT-head - No acute traumatic finding. -  Chronic small-vessel ischemic changes of the cerebral hemispheric white matter and basal ganglia, progressive since 2019. - Suspicion of small acute cortical infarction in the medial left parietooccipital junction region without evidence of mass effect or hemorrhage. Consider confirmation with MRI  MRI examination of the brain 1. Confirmed acute to subacute Left PCA territory infarct in the left occipital lobe. No associated hemorrhage or mass effect. 2. Underlying Advanced chronic small vessel disease. No other acute intracranial abnormality.  Assessment:  Robin Arellano is a 71 y.o. female with past medical history of fibromyalgia, chronic back pain, s/p recent L4-L5 posterior lumbar fusion, CAD, HTN, HLD with statin intolerance, anxiety, depression, CKD and former smoker who presents 7/3 after sustaining a fall in which her left leg gave out causing her to fall backwards. She reports R vision is darker for the last week or so and found to have a L occipital stroke in PCA territory.  Acute/Subacute Left PCA infarct  Recommendations: - HgbA1c, fasting lipid panel - Frequent neuro checks - bedside swallow screen  - Echocardiogram - CTA head and neck - Prophylactic therapy-Antiplatelet med: Aspirin - dose '81mg'$   - Risk factor modification - Telemetry monitoring - PT consult, OT consult, Speech consult - Stroke team to follow   Beulah Gandy DNP, ACNPC-AG    NEUROHOSPITALIST  ADDENDUM Performed a face to face diagnostic evaluation.   I have reviewed the contents of history and physical exam as documented by PA/ARNP/Resident and agree with above documentation.  I have discussed and formulated the above plan as documented. Edits to the note have been made as needed.  Impression/Key exam findings/Plan: admitted to the ICU for medical management of descending aorta pseudoaneurysm. Reports R vision is darker than left for the last couple of months. Had flashing lights there prior to that. Exam with R hemianopsia. Imaging with L PCA territory stroke. I do not think that the atherosclerotic ulcer of the descending aorta would contribute to this stroke. The vasculature supplying the brain comes off the arch prior to the descending aorta. Recommend full stroke workup as above, no need for permissive hypertension given symptoms about a week or so old.  Donnetta Simpers, MD [facility] [phone]   If 7pm to 7am, please call on call as listed on AMION.

## 2022-05-15 NOTE — Progress Notes (Signed)
PT Cancellation Note  Patient Details Name: Alisyn Yunique Dearcos MRN: [id number] DOB: 08-18-1951   Cancelled Treatment:    Reason Eval/Treat Not Completed: Patient at procedure or test/unavailable (CT)  Wyona Almas, PT, DPT Acute Rehabilitation Services Office [place] 05/15/2022, 3:23 PM

## 2022-05-15 NOTE — Evaluation (Signed)
Clinical/Bedside Swallow Evaluation Patient Details  Name: Robin Arellano MRN: [id number] Date of Birth: [birth year]  Today's Date: 05/15/2022 Time: SLP Start Time (ACUTE ONLY): 37 SLP Stop Time (ACUTE ONLY): 1410 SLP Time Calculation (min) (ACUTE ONLY): 15 min  Past Medical History:  Past Medical History:  Diagnosis Date   Anxiety    on meds   Back pain    Chronic female pelvic pain    Coronary artery disease    mild, non-obstructive 11/2018   Depression    on meds   Family history of adverse reaction to anesthesia    sister had difficulty waking up   Fibromyalgia    H/O leukocytosis    Headache    Hyperlipidemia    on meds   Hypertension    on meds   MI (myocardial infarction) ([place])    Pt states she did not have a MI- EKG was normal, was GERD   Osteoarthritis    on meds   Ovarian cyst, right    PONV (postoperative nausea and vomiting)    Post-operative nausea and vomiting    SVD (spontaneous vaginal delivery)    x 2   Vitamin D deficiency    Past Surgical History:  Past Surgical History:  Procedure Laterality Date   ABDOMINAL HYSTERECTOMY  1994   TAH.BSO   ANTERIOR CERVICAL DECOMP/DISCECTOMY FUSION  2019   APPENDECTOMY  1975   BACK SURGERY  2023   BIOPSY  05/12/2020   Procedure: BIOPSY;  Surgeon: Milus Banister, MD;  Location: [facility];  Service: Endoscopy;;   CARDIAC CATHETERIZATION  2020   CHOLECYSTECTOMY N/A 07/21/2020   Procedure: LAPAROSCOPIC CHOLECYSTECTOMY WITH INTRAOPERATIVE CHOLANGIOGRAM;  Surgeon: [name], MD;  Location: [facility];  Service: General;  Laterality: N/A;   COLONOSCOPY  08/12/2017   Hx TA (piecemeal)Jacobs-MAC-suprep (good)   ESOPHAGOGASTRODUODENOSCOPY (EGD) WITH PROPOFOL N/A 05/12/2020   Procedure: ESOPHAGOGASTRODUODENOSCOPY (EGD) WITH PROPOFOL;  Surgeon: Milus Banister, MD;  Location: [facility];  Service: Endoscopy;  Laterality: N/A;   EUS N/A 05/12/2020   Procedure: UPPER ENDOSCOPIC ULTRASOUND (EUS) RADIAL;   Surgeon: Milus Banister, MD;  Location: [facility];  Service: Endoscopy;  Laterality: N/A;   KNEE SURGERY Bilateral 1996   x 2 - arthroscopic   LEFT HEART CATH AND CORONARY ANGIOGRAPHY N/A 11/25/2018   Procedure: LEFT HEART CATH AND CORONARY ANGIOGRAPHY;  Surgeon: Troy Sine, MD;  Location: [facility];  Service: Cardiovascular;  Laterality: N/A;   PELVIC LAPAROSCOPY  1989   W LYSIS OF ADHESIONS/L SALPINGONEOSTOMY   TUBAL LIGATION     WISDOM TOOTH EXTRACTION     HPI:  Patient is a 71 y.o. female with PMH: HTN, CAD, HLD, CKD, former smoker, depression, fibromyalgia, chronic back pain, recent L4-5 posterior lumbar fusion who presented to the hospital on 05/14/22 after falling backwards on her porch while using her walker. CT head revealed suspicious acute left infarct and MRI confirmed acute/subacute left PCA infarct.    Assessment / Plan / Recommendation  Clinical Impression  Patient currently presenting with what appears with mild oral phase swallowing difficulty secondary to lethargy and dry oral mucosa but not appearing with stroke-related dysphagia. Patient told SLP that she still had food in her mouth from eating some of a sandwich and so SLP set her up with toothbrush suction as well as water to swish and spit to clear overall minimal amount of PO residuals in oral cavity. When observed with sips of water, Swallow initiation was timely  with no overt s/s aspiration or penetration. Patient did not want any solids at this time. SLP is recommending to continue with current diet and will f/u up at least one time to ensure diet toleration. SLP Visit Diagnosis: Dysphagia, unspecified (R13.10)    Aspiration Risk  No limitations    Diet Recommendation Regular;Thin liquid   Liquid Administration via: Cup;Straw Medication Administration: Whole meds with liquid Supervision: Patient able to self feed;Intermittent supervision to cue for compensatory strategies Compensations: Slow  rate;Small sips/bites Postural Changes: Seated upright at 90 degrees    Other  Recommendations Oral Care Recommendations: Patient independent with oral care;Oral care QID    Recommendations for follow up therapy are one component of a multi-disciplinary discharge planning process, led by the attending physician.  Recommendations may be updated based on patient status, additional functional criteria and insurance authorization.  Follow up Recommendations No SLP follow up      Assistance Recommended at Discharge Frequent or constant Supervision/Assistance  Functional Status Assessment Patient has had a recent decline in their functional status and demonstrates the ability to make significant improvements in function in a reasonable and predictable amount of time.  Frequency and Duration min 1 x/week  1 week       Prognosis Prognosis for Safe Diet Advancement: Good      Swallow Study   General Date of Onset: 05/15/22 HPI: Patient is a 71 y.o. female with PMH: HTN, CAD, HLD, CKD, former smoker, depression, fibromyalgia, chronic back pain, recent L4-5 posterior lumbar fusion who presented to the hospital on 05/14/22 after falling backwards on her porch while using her walker. CT head revealed suspicious acute left infarct and MRI confirmed acute/subacute left PCA infarct. Type of Study: Bedside Swallow Evaluation Previous Swallow Assessment: none found Diet Prior to this Study: Regular;Thin liquids Temperature Spikes Noted: No Respiratory Status: Room air History of Recent Intubation: No Behavior/Cognition: Alert;Cooperative;Pleasant mood;Lethargic/Drowsy Oral Cavity Assessment: Dry;Other (comment) (mild amount of PO residuals) Oral Care Completed by SLP: Yes Oral Cavity - Dentition: Adequate natural dentition Vision: Functional for self-feeding Self-Feeding Abilities: Able to feed self Patient Positioning: Upright in bed Baseline Vocal Quality: Normal Volitional Cough:  Weak Volitional Swallow: Able to elicit    Oral/Motor/Sensory Function Overall Oral Motor/Sensory Function: Within functional limits   Ice Chips     Thin Liquid Thin Liquid: Within functional limits Presentation: Straw;Self Fed    Nectar Thick     Honey Thick     Puree Puree: Not tested   Solid     Solid: Not tested      Sonia Baller, MA, CCC-SLP Speech Therapy

## 2022-05-15 NOTE — Plan of Care (Signed)

## 2022-05-16 ENCOUNTER — Inpatient Hospital Stay (HOSPITAL_COMMUNITY): Payer: PPO

## 2022-05-16 DIAGNOSIS — M5442 Lumbago with sciatica, left side: Secondary | ICD-10-CM

## 2022-05-16 DIAGNOSIS — I6389 Other cerebral infarction: Secondary | ICD-10-CM

## 2022-05-16 DIAGNOSIS — G8929 Other chronic pain: Secondary | ICD-10-CM

## 2022-05-16 DIAGNOSIS — I712 Thoracic aortic aneurysm, without rupture, unspecified: Secondary | ICD-10-CM | POA: Diagnosis not present

## 2022-05-16 DIAGNOSIS — I161 Hypertensive emergency: Secondary | ICD-10-CM | POA: Diagnosis not present

## 2022-05-16 DIAGNOSIS — M5441 Lumbago with sciatica, right side: Secondary | ICD-10-CM

## 2022-05-16 DIAGNOSIS — I10 Essential (primary) hypertension: Secondary | ICD-10-CM | POA: Diagnosis not present

## 2022-05-16 DIAGNOSIS — R739 Hyperglycemia, unspecified: Secondary | ICD-10-CM | POA: Diagnosis not present

## 2022-05-16 DIAGNOSIS — N179 Acute kidney failure, unspecified: Secondary | ICD-10-CM

## 2022-05-16 DIAGNOSIS — I729 Aneurysm of unspecified site: Secondary | ICD-10-CM | POA: Diagnosis not present

## 2022-05-16 LAB — CBC
HCT: 28.2 % — ABNORMAL LOW (ref 36.0–46.0)
Hemoglobin: 9.3 g/dL — ABNORMAL LOW (ref 12.0–15.0)
MCH: 32.2 pg (ref 26.0–34.0)
MCHC: 33 g/dL (ref 30.0–36.0)
MCV: 97.6 fL (ref 80.0–100.0)
Platelets: 376 10*3/uL (ref 150–400)
RBC: 2.89 MIL/uL — ABNORMAL LOW (ref 3.87–5.11)
RDW: 14.1 % (ref 11.5–15.5)
WBC: 8.6 10*3/uL (ref 4.0–10.5)
nRBC: 0 % (ref 0.0–0.2)

## 2022-05-16 LAB — BASIC METABOLIC PANEL
Anion gap: 12 (ref 5–15)
BUN: 24 mg/dL — ABNORMAL HIGH (ref 8–23)
CO2: 18 mmol/L — ABNORMAL LOW (ref 22–32)
Calcium: 8.7 mg/dL — ABNORMAL LOW (ref 8.9–10.3)
Chloride: 105 mmol/L (ref 98–111)
Creatinine, Ser: 1.61 mg/dL — ABNORMAL HIGH (ref 0.44–1.00)
GFR, Estimated: 34 mL/min — ABNORMAL LOW (ref >60)
Glucose, Bld: 95 mg/dL (ref 70–99)
Potassium: 5 mmol/L (ref 3.5–5.1)
Sodium: 135 mmol/L (ref 135–145)

## 2022-05-16 LAB — GLUCOSE, CAPILLARY
Glucose-Capillary: 92 mg/dL (ref 70–99)
Glucose-Capillary: 120 mg/dL — ABNORMAL HIGH (ref 70–99)
Glucose-Capillary: 109 mg/dL — ABNORMAL HIGH (ref 70–99)
Glucose-Capillary: 136 mg/dL — ABNORMAL HIGH (ref 70–99)
Glucose-Capillary: 163 mg/dL — ABNORMAL HIGH (ref 70–99)
Glucose-Capillary: 112 mg/dL — ABNORMAL HIGH (ref 70–99)

## 2022-05-16 LAB — HEMOGLOBIN A1C
Hgb A1c MFr Bld: 5 % (ref 4.8–5.6)
Mean Plasma Glucose: 96.8 mg/dL

## 2022-05-16 LAB — ECHOCARDIOGRAM COMPLETE
Area-P 1/2: 2.77 cm2
Calc EF: 63.7 %
Height: 63 in
S' Lateral: 3.1 cm
Single Plane A2C EF: 64.3 %
Single Plane A4C EF: 64.2 %
Weight: 2472.68 oz

## 2022-05-16 LAB — LIPID PANEL
Cholesterol: 170 mg/dL (ref 0–200)
HDL: 41 mg/dL (ref >40)
LDL Cholesterol: 101 mg/dL — ABNORMAL HIGH (ref 0–99)
Total CHOL/HDL Ratio: 4.1 RATIO
Triglycerides: 140 mg/dL (ref <150)
VLDL: 28 mg/dL (ref 0–40)

## 2022-05-16 MED ORDER — CARVEDILOL 6.25 MG PO TABS
6.2500 mg | ORAL_TABLET | Freq: Two times a day (BID) | ORAL | Status: DC
Start: 2022-05-16 — End: 2022-05-17
  Administered 2022-05-16 – 2022-05-17 (×2): 6.25 mg via ORAL
  Filled 2022-05-16 (×2): qty 1

## 2022-05-16 MED ORDER — HYDRALAZINE HCL 20 MG/ML IJ SOLN
10.0000 mg | INTRAMUSCULAR | Status: DC | PRN
  Administered 2022-05-16 – 2022-05-22 (×7): 10 mg via INTRAVENOUS
  Filled 2022-05-16 (×7): qty 1

## 2022-05-16 MED ORDER — TOPIRAMATE 25 MG PO TABS
25.0000 mg | ORAL_TABLET | Freq: Two times a day (BID) | ORAL | Status: DC
  Administered 2022-05-16 – 2022-05-22 (×13): 25 mg via ORAL
  Filled 2022-05-16 (×14): qty 1

## 2022-05-16 MED ORDER — CHLORTHALIDONE 50 MG PO TABS
50.0000 mg | ORAL_TABLET | Freq: Every day | ORAL | Status: DC
  Administered 2022-05-17: 50 mg via ORAL
  Filled 2022-05-16 (×2): qty 1

## 2022-05-16 MED ORDER — LIDOCAINE 5 % EX PTCH
1.0000 | MEDICATED_PATCH | CUTANEOUS | Status: DC
  Filled 2022-05-16 (×3): qty 1

## 2022-05-16 MED ORDER — SODIUM ZIRCONIUM CYCLOSILICATE 10 G PO PACK
10.0000 g | PACK | Freq: Once | ORAL | Status: AC
  Administered 2022-05-16: 10 g via ORAL
  Filled 2022-05-16: qty 1

## 2022-05-16 MED ORDER — CLONIDINE HCL 0.1 MG PO TABS
0.3000 mg | ORAL_TABLET | Freq: Three times a day (TID) | ORAL | Status: DC
  Administered 2022-05-16 – 2022-05-17 (×4): 0.3 mg via ORAL
  Filled 2022-05-16 (×4): qty 3

## 2022-05-16 MED ORDER — CLOPIDOGREL BISULFATE 75 MG PO TABS
75.0000 mg | ORAL_TABLET | Freq: Every day | ORAL | Status: DC
Start: 2022-05-16 — End: 2022-05-22
  Administered 2022-05-16 – 2022-05-22 (×7): 75 mg via ORAL
  Filled 2022-05-16 (×7): qty 1

## 2022-05-16 NOTE — Progress Notes (Signed)
  Subjective: Working with PT this am- able to stand No chest or upper back pain Appreciate eval and management of ASVD of descending thoracic aorta by Dr Jerry Caras Brain MRI shows subacute  left PCA stoke- seen by Neurology BP better still on Esmolol drip  Objective: Vital signs in last 24 hours: Temp:  [97.8 F (36.6 C)-98.4 F (36.9 C)] 98.2 F (36.8 C) (07/05 0745) Pulse Rate:  [43-59] 53 (07/05 0745) Cardiac Rhythm: Sinus bradycardia (07/04 2333) Resp:  [11-24] 18 (07/05 0745) BP: (85-167)/(46-95) 114/50 (07/05 0745) SpO2:  [92 %-100 %] 97 % (07/05 0745)  Hemodynamic parameters for last 24 hours:  nsr  Intake/Output from previous day: 07/04 0701 - 07/05 0700 In: 967.2 [P.O.:480; I.V.:487.2] Out: 850 [Urine:850] Intake/Output this shift: No intake/output data recorded.  NSR No murmur Pulses present both arms Lab Results: Recent Labs    05/15/22 0045 05/16/22 0224  WBC 9.2 8.6  HGB 8.7* 9.3*  HCT 26.9* 28.2*  PLT 375 376   BMET:  Recent Labs    05/15/22 0045 05/16/22 0224  NA 138 135  K 4.2 5.0  CL 106 105  CO2 23 18*  GLUCOSE 92 95  BUN 18 24*  CREATININE 1.47* 1.61*  CALCIUM 8.5* 8.7*    PT/INR: No results for input(s): "LABPROT", "INR" in the last 72 hours. ABG    Component Value Date/Time   TCO2 24 05/14/2022 1250   CBG (last 3)  Recent Labs    05/15/22 2313 05/16/22 0331 05/16/22 0743  GLUCAP 100* 92 120*    Assessment/Plan: Atherosclerotic disease with mural thickening and ulceration of descending thoracic aorta- medical management and followup planned. Post stroke therapies started.  Will be available as needed.     LOS: 2 days    Dahlia Byes 05/16/2022

## 2022-05-16 NOTE — Progress Notes (Signed)
[place] [place] Progress Note Patient Name: Robin Arellano DOB: Jul 24, 1951 MRN: [id number]   Date of Service  05/16/2022  HPI/Events of Note  Hypertension - SBP goal < 120. Can't restart Esmolol d/t HR = 42.  eICU Interventions  Plan: Hydralazine 10 mg IV Q 4 hours PRN SBP > 120.     Intervention Category Major Interventions: Hypertension - evaluation and management  Betsi Crespi [place] 05/16/2022, 11:01 PM

## 2022-05-16 NOTE — Plan of Care (Signed)

## 2022-05-16 NOTE — Plan of Care (Signed)

## 2022-05-16 NOTE — Progress Notes (Deleted)
.  pvt

## 2022-05-16 NOTE — Progress Notes (Signed)
  Echocardiogram 2D Echocardiogram has been performed.  Fidel Levy 05/16/2022, 11:34 AM

## 2022-05-16 NOTE — Progress Notes (Signed)
NAME:  Robin Arellano, MRN:  [id number], DOB:  07-21-1951, LOS: 2 ADMISSION DATE:  05/14/2022, CONSULTATION DATE:  05/14/22 REFERRING MD:  Dr. Vanita Panda, CHIEF COMPLAINT:  pseudoaneurysm/ HTN   History of Present Illness:   71 year old female with prior hx as below presenting to ER after sustaining a fall this morning around 2am.  Had recent L4-L5 posterior lumbar fusion with Dr. Ellene Route on 6/15.  Has chronic back pain/ sciatica, which started prior to her surgery.  She got up, states her left leg gave out and she fell backwards.  Since, having her chronic low back pain.  Reports she is compliant taking her 4 or 5 blood pressure pills every day, never misses but did not take them this morning.  Denies any SOB, chest pain, upper back pain, N/V/abd pain, altered sensation other than her normal sciatica.  During workup in ER, patient found to have poorly controlled hypertension with SBP's > 170s and imaging revealed a pseudoaneurysm arising from lateral distal aortic arch.  Cardiothoracic was consulted with recommendations for medical management and esmolol gtt for blood pressure control.  PCCM consulted for ICU admit.   Pertinent  Medical History  Former smoker, CAD, HTN, HLD (reportedly statin intolerant, suspected familial), anxiety, depression, fibromyalgia, chronic back pain/ sciatica, med induced pancreatitis per patient, CKD3b  Of note, patient's father passed away from aortic aneurysm.  Significant Hospital Events: Including procedures, antibiotic start and stop dates in addition to other pertinent events   7/3 admitted> esmolol, TCTS consulting  7/4  brain MRI- subacute CVA  Interim History / Subjective:  Tmax 98.4  50 esmolol  No acute overnight events  850 of urine output, 2 unmeasured urines  Subjective: Denies shortness of breath, pain improved  Objective   Blood pressure (!) 114/51, pulse (!) 53, temperature 98.2 F (36.8 C), temperature source Oral, resp. rate 20, height  '5\' 3"'$  (1.6 m), weight 70.1 kg, SpO2 96 %.        Intake/Output Summary (Last 24 hours) at 05/16/2022 0935 Last data filed at 05/16/2022 0800 Gross per 24 hour  Intake 795.51 ml  Output 850 ml  Net -54.49 ml   Filed Weights   05/14/22 1049 05/15/22 0357  Weight: 60.3 kg 70.1 kg   Examination: General: In bed, NAD, appears comfortable HEENT: MM pink/moist, anicteric, atraumatic Neuro: RASS 0, PERRL 71m, GCS 15 CV: S1S2, SB, no m/r/g appreciated PULM:  clear in the upper lobes, clear in the lower lobes, trachea midline, chest expansion symmetric GI: soft, bsx4 active, non-tender   Extremities: warm/dry, no pretibial edema, capillary refill less than 3 seconds  Skin:  no rashes or lesions noted   Labs: Hemoglobin 8.7 > 9.3 K5.0 CO2 23 > 18 Creatinine 1.47 > 1.61 LDL 101 Echo pending Blood cultures pending  Resolved Hospital Problem list    Assessment & Plan:   Aortic pseudoaneurysm with severe atherosclerotic disease -Appreciate vascular surgery assistance, planning for follow-up imaging on 7/5. -SBP goal less than 120, heart rate goal less than 80.  On esmolol drip titrate to goal. -Continue ICU care -Pain control as below  HTN emergency  -SBP goal less than 120, heart rate goal less than 80.  On esmolol drip titrate to goal. -Starting Coreg 6.25 twice daily, start chlorthalidone 50 mg daily on 7/6. continue amlodipine, continue hydralazine.  Increasing clonidine 0.3 mg to 3 times daily.  Stopping ARB due to kidney function  HLD, likely familial HLD -Continue Zetia -Continue atorvastatin 40 mg daily;  previously has intolerance 2/2 myalgias so may again not tolerate  Subacute CVA L occipital> explains her R sided vision loss 2 weeks ago MRI confirming subacute left PCA territory infarct in the left occipital lobe.  CTA head with evolving infarct in left occipital lobe without evidence of hemorrhagic transformation. No acute intracranial pathology.  1 mm and 3 mm left ICA  aneurysm. -Appreciate neuro assistance.  Appreciate input regarding CTA head and neck findings. -Continue aspirin, statin, Plavix -Follow-up echo -Frequent neurochecks -Continue to monitor monitoring -PT OT when able  CKD3b .-Ensure renal perfusion. Goal MAP 65 or greater. -Avoid neprotoxic drugs as possible. -Strict I&O's -Follow up AM creatinine  Chronic lower back pain/ sciatica; recent L4-5 fusion Subacute fracture of sacral ala, new since December Chronic opiate use -Continue PTA norco and oxycodone resume home norco prn and oxy IR prn for percocet prn  -Continue robaxin PRN -Scheduled bowel regimen -As needed Xanax  Hyperglycemia -Blood Glucose goal 140-180. -SSI PRN  Chronic anemia -Transfuse PRBC if HBG less than 7 -Obtain AM CBC to trend H&H -Monitor for signs of bleeding  Best Practice (right click and "Reselect all SmartList Selections" daily)   Diet/type: Regular consistency (see orders) DVT prophylaxis: prophylactic heparin  GI prophylaxis: N/A Lines: N/A Foley:  N/A Code Status:  full code Last date of multidisciplinary goals of care discussion [7/3]  Critical care time: 38 minutes   [employer]., MSN, APRN, AGACNP-BC  Pulmonary & Critical Care  05/16/2022 , 9:35 AM  Please see [email or web address] for pager details  If no response, please call [phone] After hours, please call Elink at [phone]

## 2022-05-16 NOTE — Evaluation (Addendum)
Physical Therapy Evaluation Patient Details Name: Robin Arellano MRN: [id number] DOB: 07-22-1951 Today's Date: 05/16/2022  History of Present Illness  Pt is a 71 y.o. female who presented to the hospital on 05/14/22 after falling backwards on her porch while using her cane. 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 MRI revealed acute/subacute left occipital infarct.  PMH: HTN, CAD, HLD, CKD, former smoker, depression, fibromyalgia, chronic back pain, recent L4-5 posterior lumbar fusion (04/26/22)   Clinical Impression  Pt admitted with above diagnosis. PTA pt lived at home with her husband and daughter. She underwent PLIF 6/15 and had progressed to using her cane for ambulation. Pt currently with functional limitations due to the deficits listed below (see PT Problem List). On eval, pt required min assist bed mobility, min assist sit to stand, and min assist ambulation 10' x 2 with RW. Ambulation limited due to pt not having lumbar corset. Husband plans to bring brace today. She presents will shuffle gait. Deficits noted in strength and balance. Pt with c/o visual deficits R eye and headache. Pt will benefit from skilled PT to increase their independence and safety with mobility to allow discharge to the venue listed below.    HR  46-50 BP supine 114/40, seated after amb 105/55 SpO2 93% on RA        Recommendations for follow up therapy are one component of a multi-disciplinary discharge planning process, led by the attending physician.  Recommendations may be updated based on patient status, additional functional criteria and insurance authorization.  Follow Up Recommendations Home health PT      Assistance Recommended at Discharge Frequent or constant Supervision/Assistance  Patient can return home with the following  A little help with walking and/or transfers;A little help with bathing/dressing/bathroom;Assistance with cooking/housework;Direct supervision/assist  for medications management;Direct supervision/assist for financial management;Assist for transportation;Help with stairs or ramp for entrance    Equipment Recommendations None recommended by PT  Recommendations for Other Services       Functional Status Assessment Patient has had a recent decline in their functional status and demonstrates the ability to make significant improvements in function in a reasonable and predictable amount of time.     Precautions / Restrictions Precautions Precautions: Fall;Back Precaution Comments: reviewed 3/3 back precautions Required Braces or Orthoses: Spinal Brace Spinal Brace: Lumbar corset (to be applied in sitting) Restrictions Other Position/Activity Restrictions: Brace not in room at time of eval. Pt called husband to bring brace today.      Mobility  Bed Mobility Overal bed mobility: Needs Assistance Bed Mobility: Rolling, Sit to Sidelying, Sidelying to Sit Rolling: Min guard Sidelying to sit: Min assist, HOB elevated     Sit to sidelying: Min assist General bed mobility comments: +rail, increased time, cues for sequencing/logroll, assist to elevate trunk for OOB and assist with BLE back to bed    Transfers Overall transfer level: Needs assistance Equipment used: Rolling walker (2 wheels) Transfers: Sit to/from Stand Sit to Stand: Min assist           General transfer comment: cues for hand placement and sequencing    Ambulation/Gait Ambulation/Gait assistance: Min assist Gait Distance (Feet): 10 Feet (x 2) Assistive device: Rolling walker (2 wheels) Gait Pattern/deviations: Decreased stride length, Shuffle Gait velocity: decreaesd Gait velocity interpretation: <1.8 ft/sec, indicate of risk for recurrent falls   General Gait Details: short, shuffle steps. Limited ambulation to/from recliner due to not having lumbar corset.  Stairs  Wheelchair Mobility    Modified Rankin (Stroke Patients Only) Modified  Rankin (Stroke Patients Only) Pre-Morbid Rankin Score: Moderate disability Modified Rankin: Moderately severe disability     Balance Overall balance assessment: Needs assistance Sitting-balance support: No upper extremity supported, Feet supported Sitting balance-Leahy Scale: Fair     Standing balance support: Bilateral upper extremity supported, During functional activity, Reliant on assistive device for balance Standing balance-Leahy Scale: Poor                               Pertinent Vitals/Pain Pain Assessment Pain Assessment: Faces Faces Pain Scale: Hurts even more Pain Location: headache Pain Descriptors / Indicators: Throbbing, Headache Pain Intervention(s): Monitored during session, Limited activity within patient's tolerance, Repositioned    Home Living Family/patient expects to be discharged to:: Private residence Living Arrangements: Spouse/significant other;Children Available Help at Discharge: Family;Available 24 hours/day Type of Home: House Home Access: Stairs to enter Entrance Stairs-Rails: Psychiatric nurse of Steps: 5   Home Layout: One level Home Equipment: BSC/3in1;Shower seat;Grab bars - toilet;Grab bars - tub/shower;Toilet riser;Rolling Walker (2 wheels);Cane - single point Additional Comments: pt reports daughter and spouse live at home, sister can come assist as needed    Prior Function Prior Level of Function : Needs assist;History of Falls (last six months)       Physical Assist : Mobility (physical);ADLs (physical) Mobility (physical): Transfers;Gait;Stairs   Mobility Comments: pt reports having assist for all mobility at home from family for safety, 18 falls since feb ADLs Comments: pt reports daughter or sister assist with ADLs as needed     Hand Dominance   Dominant Hand: Right    Extremity/Trunk Assessment   Upper Extremity Assessment Upper Extremity Assessment: Defer to OT evaluation    Lower  Extremity Assessment Lower Extremity Assessment: Generalized weakness (Pt reports numbness bilat feet.)    Cervical / Trunk Assessment Cervical / Trunk Assessment: Back Surgery  Communication   Communication: No difficulties  Cognition Arousal/Alertness: Awake/alert Behavior During Therapy: WFL for tasks assessed/performed Overall Cognitive Status: Impaired/Different from baseline Area of Impairment: Awareness, Problem solving, Safety/judgement, Attention, Memory                   Current Attention Level: Sustained Memory: Decreased short-term memory   Safety/Judgement: Decreased awareness of safety Awareness: Emergent Problem Solving: Slow processing, Requires verbal cues, Difficulty sequencing          General Comments General comments (skin integrity, edema, etc.): Visual deficits R eye. Pt reports clearer vision when R eye covered. Otherwise, pt stating "I see 3 of you." Pt also with c/o dizziness (h/o vertigo). BP stable.    Exercises     Assessment/Plan    PT Assessment Patient needs continued PT services  PT Problem List Decreased strength;Decreased activity tolerance;Decreased balance;Decreased mobility;Decreased safety awareness;Decreased knowledge of precautions;Decreased knowledge of use of DME       PT Treatment Interventions DME instruction;Gait training;Stair training;Functional mobility training;Therapeutic activities;Therapeutic exercise;Balance training;Patient/family education;Neuromuscular re-education    PT Goals (Current goals can be found in the Care Plan section)  Acute Rehab PT Goals Patient Stated Goal: to go home PT Goal Formulation: With patient Time For Goal Achievement: 05/30/22 Potential to Achieve Goals: Good    Frequency Min 4X/week     Co-evaluation               AM-PAC PT "6 Clicks" Mobility  Outcome Measure Help needed turning from your back  to your side while in a flat bed without using bedrails?: A Little Help  needed moving from lying on your back to sitting on the side of a flat bed without using bedrails?: A Little Help needed moving to and from a bed to a chair (including a wheelchair)?: A Little Help needed standing up from a chair using your arms (e.g., wheelchair or bedside chair)?: A Little Help needed to walk in hospital room?: A Little Help needed climbing 3-5 steps with a railing? : A Little 6 Click Score: 18    End of Session Equipment Utilized During Treatment: Gait belt Activity Tolerance: Patient tolerated treatment well Patient left: in bed;with call bell/phone within reach Nurse Communication: Mobility status PT Visit Diagnosis: Unsteadiness on feet (R26.81);Muscle weakness (generalized) (M62.81);History of falling (Z91.81);Repeated falls (R29.6);Difficulty in walking, not elsewhere classified (R26.2);Dizziness and giddiness (R42)    Time: 6579-0383 PT Time Calculation (min) (ACUTE ONLY): 32 min   Charges:   PT Evaluation $PT Eval Moderate Complexity: 1 Mod PT Treatments $Gait Training: 8-22 mins        Lorrin Goodell, PT  Office # [phone] Pager [phone]   Lorriane Shire 05/16/2022, 8:46 AM

## 2022-05-16 NOTE — Evaluation (Signed)
Occupational Therapy Evaluation Patient Details Name: Robin Arellano MRN: [id number] DOB: [birth year] Today's Date: 05/16/2022   History of Present Illness Pt is a 71 y.o. female who presented to the hospital on 05/14/22 after falling backwards on her porch while using her cane. 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 MRI revealed acute/subacute left occipital infarct.  PMH: HTN, CAD, HLD, CKD, former smoker, depression, fibromyalgia, chronic back pain, recent L4-5 posterior lumbar fusion (04/26/22)   Clinical Impression   Patient admitted for the diagnosis above.   Patient recently had PLIF, and presents today very fatigued.  Patient with difficulty keeping her eyes open, so OT to continue out of bed assessment and more in depth visual assessment to assist with post acute recommendations.  Patient should be able to transition home with [facility] versus Neuro Ophthalmology appointment.       Recommendations for follow up therapy are one component of a multi-disciplinary discharge planning process, led by the attending physician.  Recommendations may be updated based on patient status, additional functional criteria and insurance authorization.   Follow Up Recommendations  Other (comment) (Consider outpatient vision exam.)    Assistance Recommended at Discharge Frequent or constant Supervision/Assistance  Patient can return home with the following A little help with walking and/or transfers;A lot of help with bathing/dressing/bathroom;Assistance with cooking/housework;Direct supervision/assist for medications management;Direct supervision/assist for financial management;Assist for transportation;Help with stairs or ramp for entrance    Functional Status Assessment  Patient has had a recent decline in their functional status and demonstrates the ability to make significant improvements in function in a reasonable and predictable amount of time.  Equipment Recommendations   None recommended by OT    Recommendations for Other Services       Precautions / Restrictions Precautions Precautions: Fall;Back Precaution Booklet Issued: Yes (comment) Required Braces or Orthoses: Spinal Brace Spinal Brace: Lumbar corset Restrictions Weight Bearing Restrictions: No Other Position/Activity Restrictions: Brace brought in by spouse      Mobility Bed Mobility Overal bed mobility: Needs Assistance Bed Mobility: Rolling, Sit to Sidelying, Sidelying to Sit   Sidelying to sit: Min assist     Sit to sidelying: Min assist      Transfers                   General transfer comment: deferred, patient has had a long day, difficulty keeping her eyes open.      Balance   Sitting-balance support: No upper extremity supported, Feet supported Sitting balance-Leahy Scale: Fair                                     ADL either performed or assessed with clinical judgement   ADL       Grooming: Min guard;Wash/dry hands;Bed level               Lower Body Dressing: Moderate assistance;Bed level                       Vision Patient Visual Report: Peripheral vision impairment;Blurring of vision Additional Comments: Fairly significant impairment to R visual field.     Perception Perception Perception: Within Functional Limits   Praxis Praxis Praxis: Intact    Pertinent Vitals/Pain Pain Assessment Pain Assessment: Faces Faces Pain Scale: Hurts a little bit Pain Location: generalized Pain Descriptors / Indicators: Tightness, Other (Comment) (stiffness) Pain Intervention(s): Monitored during  session, RN gave pain meds during session     Hand Dominance Right   Extremity/Trunk Assessment Upper Extremity Assessment Upper Extremity Assessment: Generalized weakness   Lower Extremity Assessment Lower Extremity Assessment: Defer to PT evaluation   Cervical / Trunk Assessment Cervical / Trunk Assessment: Back Surgery    Communication Communication Communication: No difficulties   Cognition Arousal/Alertness: Awake/alert Behavior During Therapy: WFL for tasks assessed/performed                             Safety/Judgement: Decreased awareness of deficits Awareness: Emergent Problem Solving: Slow processing, Requires verbal cues, Difficulty sequencing       General Comments   VSS on RA    Exercises     Shoulder Instructions      Home Living Family/patient expects to be discharged to:: Private residence Living Arrangements: Spouse/significant other;Children Available Help at Discharge: Family;Available 24 hours/day Type of Home: House Home Access: Stairs to enter [employer] of Steps: 5 Entrance Stairs-Rails: Right;Left Home Layout: One level     Bathroom Shower/Tub: Occupational psychologist: Standard Bathroom Accessibility: Yes How Accessible: Accessible via walker Home Equipment: BSC/3in1;Shower seat;Grab bars - toilet;Grab bars - tub/shower;Toilet riser;Rolling Walker (2 wheels);Kasandra Knudsen - single point      Lives With: Spouse;Daughter    Prior Functioning/Environment             ADLs (physical): Bathing;Dressing;Toileting;IADLs Mobility Comments: uses RW at all times, no longer uses cane due to falls ADLs Comments: pt reports daughter or sister assist with ADLs as needed        OT Problem List: Decreased strength;Decreased activity tolerance;Impaired balance (sitting and/or standing);Decreased safety awareness;Decreased knowledge of use of DME or AE;Decreased knowledge of precautions;Pain;Impaired vision/perception      OT Treatment/Interventions: Self-care/ADL training;Therapeutic exercise;DME and/or AE instruction;Therapeutic activities;Patient/family education;Balance training;Visual/perceptual remediation/compensation    OT Goals(Current goals can be found in the care plan section) Acute Rehab OT Goals Patient Stated Goal: Return home OT  Goal Formulation: With patient Time For Goal Achievement: 05/30/22 Potential to Achieve Goals: Good ADL Goals Pt Will Perform Grooming: with supervision;standing Pt Will Perform Lower Body Dressing: with supervision;sit to/from stand Pt Will Transfer to Toilet: with supervision;ambulating;regular height toilet Additional ADL Goal #1: Patient will scan her Right visual field with min VC's for increased independence with ADL completion.  OT Frequency: Min 2X/week    Co-evaluation              AM-PAC OT "6 Clicks" Daily Activity     Outcome Measure Help from another person eating meals?: A Little Help from another person taking care of personal grooming?: A Little Help from another person toileting, which includes using toliet, bedpan, or urinal?: A Lot Help from another person bathing (including washing, rinsing, drying)?: A Lot Help from another person to put on and taking off regular upper body clothing?: A Little Help from another person to put on and taking off regular lower body clothing?: A Lot 6 Click Score: 15   End of Session Nurse Communication: Mobility status  Activity Tolerance: Patient limited by fatigue Patient left: in bed;with call bell/phone within reach;with nursing/sitter in room  OT Visit Diagnosis: Other abnormalities of gait and mobility (R26.89);Muscle weakness (generalized) (M62.81);Pain;History of falling (Z91.81);Low vision, both eyes (H54.2)                Time: 1414-1430 OT Time Calculation (min): 16 min Charges:  OT General Charges $  OT Visit: 1 Visit OT Evaluation $OT Eval Moderate Complexity: 1 Mod  05/16/2022  RP, OTR/L  Acute Rehabilitation Services  Office:  [phone]   Robin Arellano 05/16/2022, 3:23 PM

## 2022-05-16 NOTE — Progress Notes (Addendum)
  Progress Note    05/16/2022 6:55 AM Hospital Day 2  Subjective:  denies any pain-wants to know if she can go home  Afebrile HR 50's 884'Z-660'Y systolic 30% RA  Gtts:  esmolol  Vitals:   05/16/22 0615 05/16/22 0630  BP: (!) 105/55 (!) 120/51  Pulse: (!) 50 (!) 51  Resp: 12 16  Temp:    SpO2: 96% 94%    Physical Exam: General:  no distress Lungs:  non labored Extremities:  bilateral radial pulses are palpable; pedal pulses are not palpable but motor and sensory are in tact and bilateral feet are warm.  CBC    Component Value Date/Time   WBC 8.6 05/16/2022 0224   RBC 2.89 (L) 05/16/2022 0224   HGB 9.3 (L) 05/16/2022 0224   HGB 11.2 08/28/2021 1539   HCT 28.2 (L) 05/16/2022 0224   HCT 32.8 (L) 08/28/2021 1539   PLT 376 05/16/2022 0224   PLT 304 08/28/2021 1539   MCV 97.6 05/16/2022 0224   MCV 95 08/28/2021 1539   MCH 32.2 05/16/2022 0224   MCHC 33.0 05/16/2022 0224   RDW 14.1 05/16/2022 0224   RDW 12.5 08/28/2021 1539   LYMPHSABS 2.7 07/13/2020 1328   MONOABS 1.0 07/13/2020 1328   EOSABS 0.0 07/13/2020 1328   BASOSABS 0.1 07/13/2020 1328    BMET    Component Value Date/Time   NA 135 05/16/2022 0224   NA 136 08/28/2021 1539   K 5.0 05/16/2022 0224   CL 105 05/16/2022 0224   CO2 18 (L) 05/16/2022 0224   GLUCOSE 95 05/16/2022 0224   BUN 24 (H) 05/16/2022 0224   BUN 16 08/28/2021 1539   CREATININE 1.61 (H) 05/16/2022 0224   CALCIUM 8.7 (L) 05/16/2022 0224   GFRNONAA 34 (L) 05/16/2022 0224   GFRAA 56 (L) 10/13/2020 1212       Intake/Output Summary (Last 24 hours) at 05/16/2022 0655 Last data filed at 05/16/2022 0600 Gross per 24 hour  Intake 967.15 ml  Output 850 ml  Net 117.15 ml     Assessment/Plan:  71 y.o. 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  Her BP is better controlled this am.  She has  palpable radial pulses and bilateral feet are warm and well perfused with motor and sensory in tact.   -Plan to repeat CT scan tomorrow, however, her creatinine has bumped this am to 1.6.   Dr. Stanford Breed to see pt this am and make further recommendations.     Robin Locket, PA-C [facility] [phone] 05/16/2022 6:55 AM   VASCULAR STAFF ADDENDUM: I have independently interviewed and examined the patient. I agree with the above.  Monitor renal function. Plan CT angiogram if renal function improved tomorrow. Continue impulse control - SBP < 120. HR < 80.   Robin Arellano. Stanford Breed, MD [facility] Phone Number: [phone] 05/16/2022 8:48 AM

## 2022-05-16 NOTE — Progress Notes (Signed)
Speech Language Pathology Treatment: Dysphagia;Cognitive-Linquistic  Patient Details Name: Robin Arellano MRN: [id number] DOB: Feb 19, 1951 Today's Date: 05/16/2022 Time: 4174-0814 SLP Time Calculation (min) (ACUTE ONLY): 25 min  Assessment / Plan / Recommendation Clinical Impression       Pt seen for cognitive re-assessment with portions of [place] Mental Status Examination (SLUMS) with a total score not obtained, but pt demonstrating difficulty with attention tasks such as repetition of digits backwards, memory recall for objects after a time delay, and overall decreased processing of information.  Pt was oriented x4 this date and was able to answer personal information questions and give more accurate information overall from previous session with ST.  Speech/swallowing min impacted by xerostomia, but improved with moistening oral mucosa.      Dysphagia tx with intake of regular/thin liquids via straw with adequate oral preparation (after moistening oral mucosa), timely swallow, good oral/pharyngeal clearance/awareness of oral residue with independent clearance and no overt s/s of aspiration present throughout trial.  Pt denies any dysphagic symptoms prior to hospitalization and is aware of xerostomia and how this affects her overall PO intake.  Continue current diet of Regular/thin liquids with general swallowing precautions in place during meals/snacks.  Cognition impacted swallowing in prior session d/t inattention and lethargy, but this has improved overall.     HPI HPI: Patient is a 71 y.o. female with PMH: HTN, CAD, HLD, CKD, former smoker, depression, fibromyalgia, chronic back pain, recent L4-5 posterior lumbar fusion who presented to the hospital on 05/14/22 after falling backwards on her porch while using her walker. CT head revealed suspicious acute left infarct and MRI confirmed acute/subacute left PCA infarct; ST f/u for diet tolerance/ongoing cognitive assessment.       SLP Plan  Continue with current plan of care      Recommendations for follow up therapy are one component of a multi-disciplinary discharge planning process, led by the attending physician.  Recommendations may be updated based on patient status, additional functional criteria and insurance authorization.    Recommendations  Diet recommendations: Regular;Thin liquid Liquids provided via: Cup;Straw Medication Administration: Whole meds with liquid Supervision: Intermittent supervision to cue for compensatory strategies;Patient able to self feed Compensations: Slow rate;Small sips/bites;Other (Comment) (moisten oral mucosa prior to PO intake)                Oral Care Recommendations: Patient independent with oral care Follow Up Recommendations: Follow physician's recommendations for discharge plan and follow up therapies Assistance recommended at discharge: Frequent or constant Supervision/Assistance SLP Visit Diagnosis: Dysphagia, unspecified (R13.10);Attention and concentration deficit;Cognitive communication deficit (R41.841) Attention and concentration deficit following: Cerebral infarction Plan: Continue with current plan of care           Elvina Sidle, M.S., CCC-SLP  05/16/2022, 3:05 PM

## 2022-05-16 NOTE — Progress Notes (Addendum)
STROKE TEAM PROGRESS NOTE   INTERVAL HISTORY No family at bedside. States she had some wavey vision out of her right eye, vertigo (was placed on dramamine), and headaches for about 7 weeks prior to this fall.  imaging revealed a pseudoaneurysm arising from lateral distal aortic arch.  She is currently on esmolol infusion for BP control. ASA and plavix ordered. CT surgery recommends no surgery and conservative follow-up with BP less than 120 and HR less than 80. MRI brain shows subacute left PCA branch infarct and CT angiogram showed only mild atherosclerotic changes.  3 mm left supraclinoid ICA aneurysms noted incidentally. Vitals:   05/16/22 0545 05/16/22 0615 05/16/22 0630 05/16/22 0745  BP: (!) 123/58 (!) 105/55 (!) 120/51   Pulse: (!) 54 (!) 50 (!) 51   Resp: '15 12 16   '$ Temp:    98.2 F (36.8 C)  TempSrc:    Oral  SpO2: 94% 96% 94%   Weight:      Height:       CBC:  Recent Labs  Lab 05/15/22 0045 05/16/22 0224  WBC 9.2 8.6  HGB 8.7* 9.3*  HCT 26.9* 28.2*  MCV 99.6 97.6  PLT 375 245   Basic Metabolic Panel:  Recent Labs  Lab 05/15/22 0045 05/16/22 0224  NA 138 135  K 4.2 5.0  CL 106 105  CO2 23 18*  GLUCOSE 92 95  BUN 18 24*  CREATININE 1.47* 1.61*  CALCIUM 8.5* 8.7*   Lipid Panel:  Recent Labs  Lab 05/16/22 0224  CHOL 170  TRIG 140  HDL 41  CHOLHDL 4.1  VLDL 28  LDLCALC 101*   HgbA1c:  Recent Labs  Lab 05/16/22 0224  HGBA1C 5.0   Urine Drug Screen: No results for input(s): "LABOPIA", "COCAINSCRNUR", "LABBENZ", "AMPHETMU", "THCU", "LABBARB" in the last 168 hours.  Alcohol Level No results for input(s): "ETH" in the last 168 hours.  IMAGING past 24 hours CT ANGIO HEAD W OR WO CONTRAST  Result Date: 05/15/2022 CLINICAL DATA:  Stroke follow-up EXAM: CT ANGIOGRAPHY HEAD TECHNIQUE: Multidetector CT imaging of the head was performed using the standard protocol during bolus administration of intravenous contrast. Multiplanar CT image reconstructions and  MIPs were obtained to evaluate the vascular anatomy. RADIATION DOSE REDUCTION: This exam was performed according to the departmental dose-optimization program which includes automated exposure control, adjustment of the mA and/or kV according to patient size and/or use of iterative reconstruction technique. CONTRAST:  35m OMNIPAQUE IOHEXOL 350 MG/ML SOLN COMPARISON:  Brain MRI obtained earlier the same day FINDINGS: CT HEAD Brain: Again seen is evolving acute to subacute infarct in the left occipital lobe. There is no evidence of hemorrhagic transformation. There is no evidence of new acute infarct. There is no acute intracranial hemorrhage or extra-axial fluid collection. Remote lacunar infarcts in the bilateral basal ganglia and thalami and background advanced chronic white matter microangiopathy are unchanged. The ventricles are stable in size. There is no mass lesion.  There is no mass effect or midline shift. Vascular: See below. Skull: Normal. Negative for fracture or focal lesion. Sinuses: Paranasal sinuses are clear. Other: Globes and orbits are unremarkable. CTA HEAD Anterior circulation: The imaged high cervical internal carotid arteries are patent. Intracranial internal carotid arteries are patent with scattered calcified plaque but no hemodynamically significant stenosis or occlusion. The bilateral MCAs are patent with mild atherosclerotic irregularity distally but no proximal high-grade stenosis or occlusion. The bilateral ACAs are patent without proximal high-grade stenosis or occlusion. The anterior communicating  artery is normal. There is a 3 mm x 2 mm superiorly projecting aneurysm arising from the left paraclinoid ICA (12-74). There is an additional 1 mm inferiorly projecting outpouching arising from the communicating segment of the ICA which could reflect an additional tiny aneurysm (12-83). There is a 3 mm posterior inferiorly projecting outpouching arising from the right supraclinoid ICA  suspicious for small aneurysm (13-84, 12-86). Posterior circulation: The bilateral V4 segments are patent. The left PICA origin is identified. The right PICA origin is not seen. The basilar artery is patent. The bilateral PCAs are patent with atherosclerotic irregularity distally but no proximal high-grade stenosis or occlusion. There is no aneurysm or AVM. Venous sinuses: Not well evaluated due to bolus timing. Anatomic variants: None. Review of the MIP images confirms the above findings. IMPRESSION: 1. Evolving infarct in the left occipital lobe without evidence of hemorrhagic transformation. No new acute intracranial pathology. 2. Patent intracranial vasculature with mild atherosclerotic irregularity of the distal branches but no proximal high-grade stenosis or occlusion. 3. 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 Electronically Signed   By: Valetta Mole M.D.   On: 05/15/2022 16:42   CT US GUIDE VASC ACCESS RT NO REPORT  Result Date: 05/15/2022 There is no Radiologist interpretation  for this exam.  MR BRAIN WO CONTRAST  Result Date: 05/15/2022 CLINICAL DATA:  71 year old female status post fall, head trauma. Possible small acute cortical infarct on CT. EXAM: MRI HEAD WITHOUT CONTRAST TECHNIQUE: Multiplanar, multiecho pulse sequences of the brain and surrounding structures were obtained without intravenous contrast. COMPARISON:  Head and cervical spine CT yesterday. FINDINGS: Brain: Restricted diffusion in the left superior occipital lobe along the parieto-occipital sulcus corresponding to the CT finding yesterday. Only mild associated T2 and FLAIR hyperintense cytotoxic edema. No hemorrhage or mass effect. No other No restricted diffusion or evidence of acute infarction. There is a chronic microhemorrhage in the right occipital pole on SWI  (series 14, image 25). Subtle chronic linear infarct in the left cerebellum series 10, image 7. And numerous chronic lacunar infarcts scattered in the bilateral deep gray nuclei. No other chronic cerebral blood products. Additional bilateral cerebral white matter T2 and FLAIR hyperintensity. No midline shift, mass effect, evidence of mass lesion, ventriculomegaly, extra-axial collection or acute intracranial hemorrhage. Cervicomedullary junction and pituitary are within normal limits. Patchy and scattered Vascular: Major intracranial vascular flow voids are preserved, with some generalized intracranial artery dolichoectasia. Skull and upper cervical spine: Partially visible cervical ACDF hardware. Otherwise negative. Visualized bone marrow signal is within normal limits. Sinuses/Orbits: Negative orbits. Paranasal sinuses and mastoids are stable and well aerated. Other: Visible internal auditory structures appear normal. Negative visible scalp and face. IMPRESSION: 1. Confirmed acute to subacute Left PCA territory infarct in the left occipital lobe. No associated hemorrhage or mass effect. 2. Underlying Advanced chronic small vessel disease. No other acute intracranial abnormality. Electronically Signed   By: Genevie Ann M.D.   On: 05/15/2022 11:51    PHYSICAL EXAM  Physical Exam  Constitutional: Appears well-developed and well-nourished.   Cardiovascular: Normal rate and regular rhythm. HR 58 Respiratory: Effort normal, non-labored breathing  Neuro: Mental Status: Patient is awake, alert, oriented to person, place, month, year, and situation. Patient is able to give a clear and coherent history. No signs of aphasia or neglect Cranial Nerves: II: Visual  Fields are full. Pupils are equal, round, and reactive to light.   III,IV, VI: EOMI without ptosis or diploplia.  V: Facial sensation is symmetric to temperature VII: Facial movement is symmetric resting and smiling VIII: Hearing is intact to voice X:  Palate elevates symmetrically XI: Shoulder shrug is symmetric. XII: Tongue protrudes midline without atrophy or fasciculations.  Motor: Tone is normal. Bulk is normal. 5/5 strength was present in all four extremities.  No drift noted in any extremities, some back pain with lower extremity movement Sensory: Sensation is symmetric to light touch and temperature in the arms and legs. No extinction to DSS present.  Cerebellar: FNF and HKS are intact bilaterally    ASSESSMENT/PLAN [name] is a 71 y.o. female with history of fibromyalgia, chronic back pain, s/p recent L4-L5 posterior lumbar fusion, CAD, HTN, HLD with statin intolerance, anxiety, depression, CKD and former smoker who presents 7/3 after sustaining a fall in which her left leg gave out causing her to fall backwards. CT head revealed suspicious acute left infarct. MRI brain confirmed acute/subacute left PCA infarct.  Stroke:  Left PCA subacute branch infarct Etiology:  likely embolic from cryptogenic source Code Stroke CT head Suspicion of small acute cortical infarction in the medial left parietooccipital junction region without evidence of mass effect or hemorrhage CTA head & neck Evolving infarct in the left occipital lobe without evidence of hemorrhagic transformation. Patent intracranial vasculature with mild atherosclerotic irregularity of the distal branches but no proximal high-grade stenosis or occlusion. 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 MRI  acute to subacute Left PCA territory infarct in the left occipital lobe. 2D Echo EF 60-65% LDL 101 HgbA1c 5.0 VTE prophylaxis - SCDs    Diet   Diet Heart Room service appropriate? Yes; Fluid consistency: Thin   No antithrombotic prior to admission, now on aspirin 81 mg daily and clopidogrel 75  mg daily.  For 3 weeks and then aspirin alone ASA d/c'd prior to surgery, she states she had not resumed aspirin post op. Therapy recommendations:  Home health PT Disposition:  pending  Hypertension Home meds:  Norvasc, olmesartan Stable Cardiothoracic and vascular team recommends BP less than 120 and HR less than 80 Currently on Esmolol Long-term BP goal normotensive  Hyperlipidemia Home meds:  Zetia, resumed in hospital LDL 101, goal < 70 Add Atorvastatin  Continue statin at discharge  Other Stroke Risk Factors Advanced Age >/= 39  Obesity, Body mass index is 27.38 kg/m., BMI >/= 30 associated with increased stroke risk, recommend weight loss, diet and exercise as appropriate  Coronary artery disease  Other Active Problems Aortic pseudoaneurysm with severe atherosclerotic disease Esmolol BP less than 120, HR 80 CKD Stage III Chronic anemia Chronic lower back pain/ sciatica with recent L4-5 fusion Using norco, oxycodone, and xanax  Hospital day # 2  Patient seen and examined by NP/APP with MD. MD to update note as needed.   Janine Ores, DNP, FNP-BC [facility] Pager: [phone]  STROKE MD NOTE :  I have personally obtained history,examined this patient, reviewed notes, independently viewed imaging studies, participated in medical decision making and plan of care.ROS completed by me personally and pertinent positives fully documented  I have made any additions or clarifications directly to the above note. Agree with note above.  Patient has been having  subacute increase in headaches as well as some visual disturbances on the right side and MRI shows a small left PCA branch subacute infarct.  Etiology is likely embolic from cryptogenic source.  Recommend ongoing stroke evaluation and aggressive risk factor modification.  She may need prolonged cardiac monitoring for paroxysmal A-fib.  Check  30-day heart monitor at discharge Aspirin Plavix for 3 weeks  followed by aspirin alone.  No family available at bedside for discussion.  Discussed with RN.  Greater than 50% time during this 50-minute visit was spent in counseling and coordination of care and discussion patient care team and answering questions.  Stroke team will sign off.  Kindly call for questions Antony Contras, MD Medical Director [place] Pager: [phone] 05/16/2022 2:29 PM   To contact Stroke Continuity provider, please refer to [email or web address] After hours, contact General Neurology

## 2022-05-17 DIAGNOSIS — I729 Aneurysm of unspecified site: Secondary | ICD-10-CM | POA: Diagnosis not present

## 2022-05-17 DIAGNOSIS — D649 Anemia, unspecified: Secondary | ICD-10-CM | POA: Diagnosis not present

## 2022-05-17 DIAGNOSIS — I161 Hypertensive emergency: Secondary | ICD-10-CM | POA: Diagnosis not present

## 2022-05-17 LAB — BASIC METABOLIC PANEL
Anion gap: 12 (ref 5–15)
BUN: 27 mg/dL — ABNORMAL HIGH (ref 8–23)
CO2: 19 mmol/L — ABNORMAL LOW (ref 22–32)
Calcium: 8.5 mg/dL — ABNORMAL LOW (ref 8.9–10.3)
Chloride: 102 mmol/L (ref 98–111)
Creatinine, Ser: 1.98 mg/dL — ABNORMAL HIGH (ref 0.44–1.00)
GFR, Estimated: 27 mL/min — ABNORMAL LOW (ref >60)
Glucose, Bld: 110 mg/dL — ABNORMAL HIGH (ref 70–99)
Potassium: 4.4 mmol/L (ref 3.5–5.1)
Sodium: 133 mmol/L — ABNORMAL LOW (ref 135–145)

## 2022-05-17 LAB — GLUCOSE, CAPILLARY
Glucose-Capillary: 117 mg/dL — ABNORMAL HIGH (ref 70–99)
Glucose-Capillary: 134 mg/dL — ABNORMAL HIGH (ref 70–99)
Glucose-Capillary: 111 mg/dL — ABNORMAL HIGH (ref 70–99)
Glucose-Capillary: 117 mg/dL — ABNORMAL HIGH (ref 70–99)
Glucose-Capillary: 124 mg/dL — ABNORMAL HIGH (ref 70–99)

## 2022-05-17 LAB — CBC
HCT: 26 % — ABNORMAL LOW (ref 36.0–46.0)
Hemoglobin: 8.6 g/dL — ABNORMAL LOW (ref 12.0–15.0)
MCH: 32.3 pg (ref 26.0–34.0)
MCHC: 33.1 g/dL (ref 30.0–36.0)
MCV: 97.7 fL (ref 80.0–100.0)
Platelets: 350 10*3/uL (ref 150–400)
RBC: 2.66 MIL/uL — ABNORMAL LOW (ref 3.87–5.11)
RDW: 14 % (ref 11.5–15.5)
WBC: 6.5 10*3/uL (ref 4.0–10.5)
nRBC: 0 % (ref 0.0–0.2)

## 2022-05-17 LAB — MAGNESIUM: Magnesium: 2.3 mg/dL (ref 1.7–2.4)

## 2022-05-17 MED ORDER — BUTALBITAL-APAP-CAFFEINE 50-325-40 MG PO TABS
1.0000 | ORAL_TABLET | Freq: Four times a day (QID) | ORAL | Status: DC | PRN
Start: 2022-05-17 — End: 2022-05-22
  Administered 2022-05-17 – 2022-05-22 (×5): 1 via ORAL
  Filled 2022-05-17 (×5): qty 1

## 2022-05-17 MED ORDER — CARVEDILOL 6.25 MG PO TABS: 6.2500 mg | ORAL_TABLET | Freq: Two times a day (BID) | ORAL | Status: DC

## 2022-05-17 MED ORDER — CARVEDILOL 3.125 MG PO TABS: 3.1250 mg | ORAL_TABLET | Freq: Two times a day (BID) | ORAL | Status: DC

## 2022-05-17 MED ORDER — CLONIDINE HCL 0.1 MG PO TABS
0.1000 mg | ORAL_TABLET | Freq: Three times a day (TID) | ORAL | Status: DC
Start: 2022-05-17 — End: 2022-05-18
  Administered 2022-05-17 – 2022-05-18 (×2): 0.1 mg via ORAL
  Filled 2022-05-17 (×3): qty 1

## 2022-05-17 NOTE — Progress Notes (Addendum)
  Progress Note    05/17/2022 6:46 AM Hospital Day 3  Subjective:  says her pain is about the same.  Wants to go home.  Says her feet are numb but this is not new.   Afebrile HR 70's-50's 025'K-270'W systolic 23% 7SE8BT  Gtts: None currently  Vitals:   05/17/22 0545 05/17/22 0600  BP: (!) 138/51 (!) 131/50  Pulse: (!) 45 (!) 45  Resp: 11 16  Temp:    SpO2: 94% 98%    Physical Exam: Cardiac:  bradycardic Lungs:  non labored Extremities:  palpable bilateral radial and right DP; unable to palpate left pedal pulses but bilateral feet are equally warm with sensory and motor in tact.   CBC    Component Value Date/Time   WBC 6.5 05/17/2022 0138   RBC 2.66 (L) 05/17/2022 0138   HGB 8.6 (L) 05/17/2022 0138   HGB 11.2 08/28/2021 1539   HCT 26.0 (L) 05/17/2022 0138   HCT 32.8 (L) 08/28/2021 1539   PLT 350 05/17/2022 0138   PLT 304 08/28/2021 1539   MCV 97.7 05/17/2022 0138   MCV 95 08/28/2021 1539   MCH 32.3 05/17/2022 0138   MCHC 33.1 05/17/2022 0138   RDW 14.0 05/17/2022 0138   RDW 12.5 08/28/2021 1539   LYMPHSABS 2.7 07/13/2020 1328   MONOABS 1.0 07/13/2020 1328   EOSABS 0.0 07/13/2020 1328   BASOSABS 0.1 07/13/2020 1328    BMET    Component Value Date/Time   NA 133 (L) 05/17/2022 0138   NA 136 08/28/2021 1539   K 4.4 05/17/2022 0138   CL 102 05/17/2022 0138   CO2 19 (L) 05/17/2022 0138   GLUCOSE 110 (H) 05/17/2022 0138   BUN 27 (H) 05/17/2022 0138   BUN 16 08/28/2021 1539   CREATININE 1.98 (H) 05/17/2022 0138   CALCIUM 8.5 (L) 05/17/2022 0138   GFRNONAA 27 (L) 05/17/2022 0138   GFRAA 56 (L) 10/13/2020 1212    INR No results found for: "INR"   Intake/Output Summary (Last 24 hours) at 05/17/2022 0646 Last data filed at 05/17/2022 0400 Gross per 24 hour  Intake 550.95 ml  Output 1000 ml  Net -449.05 ml     Assessment/Plan:  71 y.o. female with PAU Hospital Day 3  -pain unchanged and not worse; palpable bilateral radial pulses and right DP pulse.   Unable to palpate left pedal pulses however, bilateral feet are equally warm with motor and sensory in tact. She did have c/o numbness bilateral feet this morning but tells me this is not new for her.  -esmolol gtt discontinued due to bradycardia-now receiving hydralazine '10mg'$  IV D1V prn for systolic > 616. -AKI with increasing Cr to 1.98 today.  ARB discontinued yesterday and po medication dosing.  K+ 4.4 this morning.   UOP of 1.2L recorded yesterday (no output recorded for first shift). -due to AKI, CTA not performed.  Will get CTA when renal function improved.     Leontine Locket, PA-C [facility] [phone] 05/17/2022 6:46 AM  VASCULAR STAFF ADDENDUM: I have independently interviewed and examined the patient. I agree with the above.   [name]. Stanford Breed, MD [facility] Phone Number: [phone] 05/17/2022 5:08 PM

## 2022-05-17 NOTE — Progress Notes (Signed)
[place] Progress Note Patient Name: Robin Arellano DOB: 03-25-1951 MRN: [id number]   Date of Service  05/17/2022  HPI/Events of Note  Hypertension  - SBP = 130 - 140. Goal SBP < 120. Esmolol IV infusion restarted. HR remains = 52 which is acceptable.   eICU Interventions  Plan: Continue present management. Watch HR response to Esmolol closely.      Intervention Category Major Interventions: Hypertension - evaluation and management  [place] Deupree Eugene 05/17/2022, 2:14 AM

## 2022-05-17 NOTE — Progress Notes (Signed)
NAME:  Robin Arellano, MRN:  [id number], DOB:  [birth year], LOS: 3 ADMISSION DATE:  05/14/2022, CONSULTATION DATE:  05/14/22 REFERRING MD:  Dr. Vanita Panda, CHIEF COMPLAINT:  pseudoaneurysm/ HTN   History of Present Illness:   71 year old female with prior hx as below presenting to ER after sustaining a fall this morning around 2am.  Had recent L4-L5 posterior lumbar fusion with Dr. Ellene Route on 6/15.  Has chronic back pain/ sciatica, which started prior to her surgery.  She got up, states her left leg gave out and she fell backwards.  Since, having her chronic low back pain.  Reports she is compliant taking her 4 or 5 blood pressure pills every day, never misses but did not take them this morning.  Denies any SOB, chest pain, upper back pain, N/V/abd pain, altered sensation other than her normal sciatica.  During workup in ER, patient found to have poorly controlled hypertension with SBP's > 170s and imaging revealed a pseudoaneurysm arising from lateral distal aortic arch.  Cardiothoracic was consulted with recommendations for medical management and esmolol gtt for blood pressure control.  PCCM consulted for ICU admit.   Pertinent  Medical History  Former smoker, CAD, HTN, HLD (reportedly statin intolerant, suspected familial), anxiety, depression, fibromyalgia, chronic back pain/ sciatica, med induced pancreatitis per patient, CKD3b  Of note, patient's father passed away from aortic aneurysm.  Significant Hospital Events: Including procedures, antibiotic start and stop dates in addition to other pertinent events   7/3 admitted> esmolol, TCTS consulting  7/4  brain MRI- subacute CVA 7/5 off esmolol  Interim History / Subjective:  Overnight events-esmolol discontinued for bradycardia, hydralazine 10 mg IV started.  Tmax 97.5  -400 mL admission, 1.2 L urine output, -600 past 24 hours  Remains off esmolol drip  Some numbness lower extremities, this is not abnormal for  patient.  Subjective: Endorses back pain while sitting in chair, denies chest pain, denies shortness of breath   Objective   Blood pressure (!) 111/52, pulse (!) 38, temperature (!) 97.5 F (36.4 C), resp. rate (!) 8, height '5\' 3"'$  (1.6 m), weight 72.4 kg, SpO2 98 %.        Intake/Output Summary (Last 24 hours) at 05/17/2022 0934 Last data filed at 05/17/2022 0900 Gross per 24 hour  Intake 664.57 ml  Output 1900 ml  Net -1235.43 ml   Filed Weights   05/14/22 1049 05/15/22 0357 05/17/22 0645  Weight: 60.3 kg 70.1 kg 72.4 kg   Examination: General: In chair, NAD, appears comfortable HEENT: MM pink/moist, anicteric, atraumatic Neuro: RASS 0, PERRL 39m, GCS 15 CV: S1S2, SB, no m/r/g appreciated PULM: Air movement in all lobes, trachea midline, chest expansion symmetric GI: soft, bsx4 active, non-tender   Extremities: warm/dry, no pretibial edema, capillary refill less than 3 seconds  Skin:  no rashes or lesions noted  Labs: Hemoglobin 8.7 >9.3 >8.6 Creatinine 1.47 > 1.67 > 1.90 BUN 24 > 27 CO2 18 > 19 Sodium 135 > 133 ECHO: LVEF 60 to 65%, no wall motion abnormalities, RV SF normal  Resolved Hospital Problem list    Assessment & Plan:   Aortic pseudoaneurysm with severe atherosclerotic disease HTN emergency-improving -Appreciate vascular surgery assistance.  Holding on CTA due to impaired renal function. -Systolic blood pressure goal less than 120, heart rate goal less than 80s.  Patient remains on esmolol infusion. -Continue Coreg 6.25 mg twice daily, amlodipine 10 mg daily, hydralazine 100 mg 3 times daily, clonidine 3 times daily.  ARB stopped on 7/5 due to decreased renal function -We will discuss remaining in ICU versus transfer to progressive with degree of patient's bradycardia.  Bradycardia secondary to beta-blockade.  HLD, likely familial HLD -Continue Zetia -Continue atorvastatin 40 mg daily; previously has intolerance 2/2 myalgias so may again not  tolerate  Subacute CVA L occipital> explains her R sided vision loss 2 weeks ago MRI confirming subacute left PCA territory infarct in the left occipital lobe. Neuro believes likely embolic from cryptogenic source.   CTA head with evolving infarct in left occipital lobe without evidence of hemorrhagic transformation. No acute intracranial pathology.  1 mm and 3 mm left ICA aneurysm. -Appreciate neurology assistance.  Neurology signed off 7/5 -Continue aspirin Plavix for 3 weeks, then followed by aspirin alone -neurochecks per unit protocol -We will need 30-day heart monitor at discharge to assess for paroxysmal A-fib -Continue PT OT  AKI on CKD3b Creatinine 1.47 > 1.67 > 1.90. 1.2L UOP -Ensure renal perfusion. Goal MAP 65 or greater. -Avoid neprotoxic drugs as possible. -Strict I&O's -Follow up AM creatinine  Chronic lower back pain/ sciatica; recent L4-5 fusion Subacute fracture of sacral ala, new since December Chronic opiate use -Continue PTA norco and oxycodone resume home norco prn and oxy IR prn for percocet prn  -Continue robaxin PRN -Lidocaine patch -As needed Xanax -Bowel regimen scheduled  Hyperglycemia -Blood Glucose goal 140-180. -SSI  Chronic anemia -Transfuse PRBC if HBG less than 7 -Obtain AM CBC to trend H&H -Monitor for signs of bleeding  Best Practice (right click and "Reselect all SmartList Selections" daily)   Diet/type: Regular consistency (see orders) DVT prophylaxis: prophylactic heparin  GI prophylaxis: N/A Lines: N/A Foley:  N/A Code Status:  full code Last date of multidisciplinary goals of care discussion [7/3]  Critical care time: n/a  [employer] MSN, APRN, AGACNP-BC [facility]  05/17/2022 , 9:34 AM  Please see [email or web address] for pager details  If no response, please call [phone] After hours, please call Elink at [phone]

## 2022-05-17 NOTE — Consult Note (Addendum)
Cardiology Consultation:   Patient ID: Robin Arellano MRN: [id number]; DOB: 10-10-1951  Admit date: 05/14/2022 Date of Consult: 05/17/2022  PCP:  Sandi Mariscal, MD   [facility] Providers Cardiologist:  Shelva Majestic, MD 04/18/2022  Patient Profile:   Robin Arellano is a 71 y.o. female with a hx of CAD w/ RCA and LAD dz 2019 CT cardiac >> cath 11/2018 w/ no flow-limiting dz, HTN on mult meds, HLD (familial) intol Crestor (on Zetia), Cspine surgery 2021, Low back pain, who is being seen 05/17/2022 for the evaluation of BP control in the setting of aortic aneurysm, at the request of Dr Carlis Abbott.  History of Present Illness:   Robin Arellano was seen by Dr Claiborne Billings 06/07, BP had been difficult to control. She was taking hydralazine 100 mg qd (not TID), was on olmesartan 40 mg qd (cut in half) and amlodipine 10 mg qd. She had been started on doxazosin, but was not taking it, it made her dizzy. Hydralazine was increased to 2 x day. She was to have back surgery by Dr Ellene Route 06/15 and was cleared for this.   She had the surgery, a posterior fusion, on 06/15, d/c the next day, tolerated well.   She was admitted 07/03 after a 2am fall, associated w/ L leg pain. CT performed showing small pseudoaneurysm arising from the undersurface of the lateral distal aortic arch at the level of the subclavian artery origin. TCTS consult recommended.   PVT saw her, SBP noted to be consistently in the 180s. He did not not recommend aortic surgical invention due to severe diffuse atherosclerotic disease of the descending thoracic aorta with mural thickening, small diameter, and asymptomatic ulceration. VVS consult recommended.  Seen by Dr Stanford Breed, there are 2 small penetrating atherosclerotic ulcers in the descending thoracic aorta.  1 is immediately distal to the left subclavian artery, and measures 2 cm in largest diameter.  More distally in the mid descending thoracic aorta there is a smaller outpouching consistent  with PAU which measures less than a centimeter.   At this time, he recs SBP < 120 w/ HR < 80. Rescan in 48 hr, intervention only for rapidly enlarging ulcers or symptomatic lesions.   She had some sx concerning for Neuro origin, CT head suspicious for acute L infarct, MRI w/ acute/subacute L PCA infarct. Seen by Neuro for CVA. Atherosclerotic ulcer of the descending aorta would not contribute to this stroke. Because sx started a week or so ago, no need for permissive hypertension.  Esmolol gtt helped w/ BP control. ARB d/c 'd 2nd AKI, Coreg added, on clonidine, chlorthalidone added.   Cards consulted 07/06 because we will follow her for HTN as outpt. In-pt rehab consulted as well.   Robin Arellano has had problems tolerating multiple medications. She c/o dizziness and being light-headed. Says she fell 14 times since March, feels BP meds contributed to this, not pain meds or muscle relaxers.  Did not tolerate hydralazine tid, was doing ok w/ 100 mg bid. Hydral increased back to tid, pt doing ok, but has not been OOB much.   Did not tolerate doxazosin w/ dizziness, fingers swelling.   She was started on Coreg 6.25 mg bid, but has had bradycardia, HR in the 30s at times, sustaining in the 40s.   Avapro 300 mg qd d/c'd after 07/05 dose due to AKI. Cr 1.98, BUN 27 today. On 07/05, K+ 5.0, got Lokelma >> 4.4.   She is on steroids for her back pain.  Past Medical History:  Diagnosis Date   Anxiety    on meds   Back pain    Chronic female pelvic pain    Coronary artery disease    mild, non-obstructive 11/2018   Depression    on meds   Family history of adverse reaction to anesthesia    sister had difficulty waking up   Fibromyalgia    H/O leukocytosis    Headache    Hyperlipidemia    on meds   Hypertension    on meds   MI (myocardial infarction) ([place])    Pt states she did not have a MI- EKG was normal, was GERD   Osteoarthritis    on meds   Ovarian cyst, right    PONV  (postoperative nausea and vomiting)    Post-operative nausea and vomiting    SVD (spontaneous vaginal delivery)    x 2   Vitamin D deficiency     Past Surgical History:  Procedure Laterality Date   ABDOMINAL HYSTERECTOMY  1994   TAH.BSO   ANTERIOR CERVICAL DECOMP/DISCECTOMY FUSION  2019   APPENDECTOMY  1975   BACK SURGERY  2023   BIOPSY  05/12/2020   Procedure: BIOPSY;  Surgeon: Milus Banister, MD;  Location: [facility];  Service: Endoscopy;;   CARDIAC CATHETERIZATION  2020   CHOLECYSTECTOMY N/A 07/21/2020   Procedure: LAPAROSCOPIC CHOLECYSTECTOMY WITH INTRAOPERATIVE CHOLANGIOGRAM;  Surgeon: [name], MD;  Location: [place];  Service: General;  Laterality: N/A;   COLONOSCOPY  08/12/2017   Hx TA (piecemeal)Jacobs-MAC-suprep (good)   ESOPHAGOGASTRODUODENOSCOPY (EGD) WITH PROPOFOL N/A 05/12/2020   Procedure: ESOPHAGOGASTRODUODENOSCOPY (EGD) WITH PROPOFOL;  Surgeon: Milus Banister, MD;  Location: [facility];  Service: Endoscopy;  Laterality: N/A;   EUS N/A 05/12/2020   Procedure: UPPER ENDOSCOPIC ULTRASOUND (EUS) RADIAL;  Surgeon: Milus Banister, MD;  Location: [facility];  Service: Endoscopy;  Laterality: N/A;   KNEE SURGERY Bilateral 1996   x 2 - arthroscopic   LEFT HEART CATH AND CORONARY ANGIOGRAPHY N/A 11/25/2018   Procedure: LEFT HEART CATH AND CORONARY ANGIOGRAPHY;  Surgeon: Troy Sine, MD;  Location: [place] [facility];  Service: Cardiovascular;  Laterality: N/A;   PELVIC LAPAROSCOPY  1989   W LYSIS OF ADHESIONS/L SALPINGONEOSTOMY   TUBAL LIGATION     WISDOM TOOTH EXTRACTION       Home Medications:  Prior to Admission medications   Medication Sig Start Date End Date Taking? Authorizing Provider  ALPRAZolam Duanne Moron) 1 MG tablet Take 0.5 mg by mouth daily as needed for sleep or anxiety. 03/09/22  Yes [provider]  amLODipine (NORVASC) 10 MG tablet TAKE 1 TABLET(10 MG) BY MOUTH DAILY Patient taking differently: Take 10 mg by mouth daily. 10/30/21   Yes Troy Sine, MD  Ascorbic Acid (VITAMIN C) 1000 MG tablet Take 1,000 mg by mouth in the morning.   Yes [provider]  aspirin EC 81 MG tablet Take 81 mg by mouth as needed (chest pain). Swallow whole.   Yes [provider]  Calcium Carbonate Antacid (TUMS PO) Take 2 tablets by mouth daily as needed (stomach pain).   Yes [provider]  Cholecalciferol (VITAMIN D) 50 MCG (2000 UT) tablet Take 2,000 Units by mouth in the morning.   Yes [provider]  ezetimibe (ZETIA) 10 MG tablet TAKE 1 TABLET(10 MG) BY MOUTH DAILY Patient taking differently: Take 10 mg by mouth daily. 11/29/21  Yes Troy Sine, MD  hydrALAZINE (APRESOLINE) 100 MG  tablet Take 1 tablet (100 mg total) by mouth 3 (three) times daily. Patient taking differently: Take 100 mg by mouth in the morning. 10/04/21  Yes Troy Sine, MD  Lidocaine (BLUE-EMU PAIN RELIEF DRY EX) Apply 1 application  topically as needed (pain).   Yes [provider]  methocarbamol (ROBAXIN) 500 MG tablet Take 1 tablet (500 mg total) by mouth every 6 (six) hours as needed for muscle spasms. 04/27/22  Yes Kristeen Miss, MD  methylPREDNISolone (MEDROL DOSEPAK) 4 MG TBPK tablet Take 4 mg by mouth in the morning, at noon, in the evening, and at bedtime. 05/08/22  Yes [provider]  olmesartan (BENICAR) 40 MG tablet Take 1 tablet (40 mg total) by mouth daily. Patient taking differently: Take 20 mg by mouth in the morning. 08/14/21 08/09/22 Yes Duke, Tami Lin, PA  oxyCODONE-acetaminophen (PERCOCET/ROXICET) 5-325 MG tablet Take 1-2 tablets by mouth every 4 (four) hours as needed for moderate pain or severe pain. Patient taking differently: Take 1 tablet by mouth every 6 (six) hours as needed for moderate pain or severe pain. 04/27/22  Yes Kristeen Miss, MD  valACYclovir (VALTREX) 500 MG tablet TAKE 1 TABLET BY MOUTH TWICE DAILY FOR 3 TO 5 DAYS THEN TAKE DAILY AS NEEDED Patient taking differently:  Take 500 mg by mouth 2 (two) times daily as needed (shingles flair up). 12/10/19  Yes Huel Cote, NP  HYDROcodone-acetaminophen (NORCO) 10-325 MG tablet Take 1 tablet by mouth 3 (three) times daily as needed for moderate pain. Patient not taking: Reported on 05/14/2022 04/18/20   [provider]  zolpidem (AMBIEN) 10 MG tablet Take 10 mg by mouth at bedtime as needed for sleep.  Patient not taking: Reported on 05/14/2022 01/09/14   [provider]    Inpatient Medications: Scheduled Meds:  sodium chloride   Intravenous Once   amLODipine  10 mg Oral Daily   aspirin  81 mg Oral Daily   atorvastatin  40 mg Oral Daily   carvedilol  6.25 mg Oral BID WC   Chlorhexidine Gluconate Cloth  6 each Topical Daily   chlorthalidone  50 mg Oral Daily   cloNIDine  0.3 mg Oral TID   clopidogrel  75 mg Oral Daily   docusate sodium  100 mg Oral Daily   ezetimibe  10 mg Oral Daily   heparin injection (subcutaneous)  5,000 Units Subcutaneous Q8H   hydrALAZINE  100 mg Oral TID   insulin aspart  1-3 Units Subcutaneous Q4H   lidocaine  1 patch Transdermal Q24H   polyethylene glycol  17 g Oral Daily   topiramate  25 mg Oral BID   Continuous Infusions:  esmolol Stopped (05/17/22 0349)   PRN Meds: ALPRAZolam, butalbital-acetaminophen-caffeine, hydrALAZINE, lip balm, methocarbamol, mouth rinse, oxyCODONE-acetaminophen, senna  Allergies:    Allergies  Allergen Reactions   Flexeril [Cyclobenzaprine] Anaphylaxis, Hives, Itching and Swelling   Zanaflex [Tizanidine] Anaphylaxis, Hives, Itching and Swelling   Latex Rash    Social History:   Social History   Socioeconomic History   Marital status: Married    Spouse name: don   Number of children: 2   Years of education: College   Highest education level: Not on file  Occupational History   Occupation: Realtor  Tobacco Use   Smoking status: Former    Packs/day: 0.15    Years: 20.00    Total pack years: 3.00    Types: Cigarettes     Quit date: 11/12/1998    Years since  quitting: 23.5   Smokeless tobacco: Never  Vaping Use   Vaping Use: Never used  Substance and Sexual Activity   Alcohol use: Not Currently   Drug use: No   Sexual activity: Not Currently    Birth control/protection: Post-menopausal, Surgical    Comment: HYSTERECTOMY  Other Topics Concern   Not on file  Social History Narrative   Lives at home with her husband Don   Right handed   Caffeine: unsweet tea, 2 glasses daily   Social Determinants of Health   Financial Resource Strain: Not on file  Food Insecurity: Not on file  Transportation Needs: Not on file  Physical Activity: Not on file  Stress: Not on file  Social Connections: Not on file  Intimate Partner Violence: Not on file    Family History:   Family History  Problem Relation Age of Onset   Uterine cancer Mother 67   Aneurysm Father    Heart disease Paternal Grandfather    COPD Brother    Skin cancer Brother    Other Sister        MGUS    Skin cancer Brother    Other Sister        MA   Colon cancer Neg Hx    Rectal cancer Neg Hx    Stomach cancer Neg Hx    Stroke Neg Hx    Neuropathy Neg Hx    Colon polyps Neg Hx    Esophageal cancer Neg Hx      ROS:  Please see the history of present illness.  All other ROS reviewed and negative.     Physical Exam/Data:   Vitals:   05/17/22 1116 05/17/22 1200 05/17/22 1300 05/17/22 1400  BP:  (!) 137/58 138/62 (!) 143/64  Pulse:  (!) 44 (!) 47 (!) 51  Resp:  _0 Temp: (!) 97.5 F (36.4 C)     TempSrc:      SpO2:  96% 98% 97%  Weight:      Height:        Intake/Output Summary (Last 24 hours) at 05/17/2022 1548 Last data filed at 05/17/2022 0900 Gross per 24 hour  Intake 646.49 ml  Output 1900 ml  Net -1253.51 ml      05/17/2022    6:45 AM 05/15/2022    3:57 AM 05/14/2022   10:49 AM  Last 3 Weights  Weight (lbs) 159 lb 9.8 oz 154 lb 8.7 oz 133 lb  Weight (kg) 72.4 kg 70.1 kg 60.328 kg     Body mass index is 28.27  kg/m.  General:  Well nourished, well developed, in no acute distress HEENT: normal for age except for hemianopsia Neck: no JVD Vascular: bilat carotid bruits; Distal pulses 2+ bilaterally Cardiac:  normal S1, S2; RRR; soft murmur  Lungs:  clear to auscultation bilaterally, no wheezing, rhonchi or rales  Abd: soft, nontender, no hepatomegaly  Ext: no edema Musculoskeletal:  No deformities, BUE and BLE strength weak but equal Skin: warm and dry  Neuro:  CNs 2-12 intact, no focal abnormalities noted Psych:  Normal affect   EKG:  The EKG was personally reviewed and demonstrates:  sinus rhythm, HR 65, no acute ischemic changes  Telemetry:  Telemetry was personally reviewed and demonstrates:  sinus bradycardia to the 30s.  Relevant CV Studies:  CARDIAC CATH: 11/25/2018 Prox RCA lesion is 20% stenosed. Mid RCA lesion is 20% stenosed. Ost 1st Mrg lesion is 25% stenosed. Prox LAD lesion is 25% stenosed.  Mid LAD lesion is 20% stenosed. Dist LAD lesion is 20% stenosed. Mid LM lesion is 5% stenosed.   Mild multi-vessel nonobstructive CAD with narrowings no greater than 25% in the LAD, circumflex marginal, and dominant  RCA.  LVEDP 10 mm Hg.  RECOMMENDATION: Medical therapy.  Aggressive lipid-lowering therapy with target LDL less than 70.  The patient's chest pain has atypical features and is most likely nonischemic.   ECHO: 10/14/2018  Study Conclusions - Left ventricle: Average global longitudinal LV strain is normal   at -18.7% The cavity size was normal. Systolic function was   normal. The estimated ejection fraction was in the range of 60%   to 65%. Wall motion was normal; there were no regional wall   motion abnormalities. The study is not technically sufficient to   allow evaluation of LV diastolic function. - Aortic valve: Trileaflet; mildly thickened, mildly calcified   leaflets. - Mitral valve: There was mild regurgitation. - Atrial septum: There was increased thickness of  the septum,   consistent with lipomatous hypertrophy.  Laboratory Data:  High Sensitivity Troponin:  No results for input(s): "TROPONINIHS" in the last 720 hours.   Chemistry Recent Labs  Lab 05/15/22 0045 05/16/22 0224 05/17/22 0138  NA 138 135 133*  K 4.2 5.0 4.4  CL 106 105 102  CO2 23 18* 19*  GLUCOSE 92 95 110*  BUN 18 24* 27*  CREATININE 1.47* 1.61* 1.98*  CALCIUM 8.5* 8.7* 8.5*  MG  --   --  2.3  GFRNONAA 38* 34* 27*  ANIONGAP _0 Recent Labs  Lab 05/14/22 1236  PROT 6.2*  ALBUMIN 3.1*  AST 26  ALT 17  ALKPHOS 124  BILITOT 0.9   Lipids  Recent Labs  Lab 05/16/22 0224  CHOL 170  TRIG 140  HDL 41  LDLCALC 101*  CHOLHDL 4.1    Hematology Recent Labs  Lab 05/15/22 0045 05/16/22 0224 05/17/22 0138  WBC 9.2 8.6 6.5  RBC 2.70* 2.89* 2.66*  HGB 8.7* 9.3* 8.6*  HCT 26.9* 28.2* 26.0*  MCV 99.6 97.6 97.7  MCH 32.2 32.2 32.3  MCHC 32.3 33.0 33.1  RDW 14.6 14.1 14.0  PLT 375 376 350   Thyroid No results for input(s): "TSH", "FREET4" in the last 168 hours.  BNPNo results for input(s): "BNP", "PROBNP" in the last 168 hours.  DDimer No results for input(s): "DDIMER" in the last 168 hours.   Radiology/Studies:  DG Chest Port 1 View  Result Date: 05/16/2022 CLINICAL DATA:  Dyspnea. EXAM: PORTABLE CHEST 1 VIEW COMPARISON:  September 22, 2018 FINDINGS: The heart size and mediastinal contours are within normal limits. There is marked severity calcification of the thoracic aorta. Both lungs are clear. A radiopaque fusion plate and screws are seen overlying the lower cervical spine. The visualized skeletal structures are unremarkable. IMPRESSION: No active cardiopulmonary disease. Electronically Signed   By: Virgina Norfolk M.D.   On: 05/16/2022 19:28   ECHOCARDIOGRAM COMPLETE  Result Date: 05/16/2022    ECHOCARDIOGRAM REPORT   Patient Name:   Robin Arellano Date of Exam: 05/16/2022 Medical Rec #:  [id number]            Height:       63.0 in Accession  #:    [id number]           Weight:       154.5 lb Date of Birth:  11/15/1950  BSA:          1.733 m Patient Age:    89 years             BP:           108/47 mmHg Patient Gender: F                    HR:           51 bpm. Exam Location:  Inpatient Procedure: 2D Echo, Cardiac Doppler and Color Doppler Indications:    Stroke I63.9  History:        Patient has prior history of Echocardiogram examinations, most                 recent 10/14/2018. Previous Myocardial Infarction and CAD; Risk                 Factors:Hypertension and Dyslipidemia.  Sonographer:    Bernadene Person RDCS Referring Phys: [place]  1. Left ventricular ejection fraction, by estimation, is 60 to 65%. The left ventricle has normal function. The left ventricle has no regional wall motion abnormalities. There is mild concentric left ventricular hypertrophy. Left ventricular diastolic parameters are indeterminate.  2. Right ventricular systolic function is normal. The right ventricular size is normal. There is normal pulmonary artery systolic pressure.  3. Right atrial size was mildly dilated.  4. The mitral valve is normal in structure. Trivial mitral valve regurgitation. No evidence of mitral stenosis.  5. The aortic valve is normal in structure. Aortic valve regurgitation is not visualized. Aortic valve sclerosis is present, with no evidence of aortic valve stenosis.  6. The inferior vena cava is normal in size with greater than 50% respiratory variability, suggesting right atrial pressure of 3 mmHg. FINDINGS  Left Ventricle: Left ventricular ejection fraction, by estimation, is 60 to 65%. The left ventricle has normal function. The left ventricle has no regional wall motion abnormalities. The left ventricular internal cavity size was normal in size. There is  mild concentric left ventricular hypertrophy. Left ventricular diastolic parameters are indeterminate. Right Ventricle: The right ventricular size is normal.  No increase in right ventricular wall thickness. Right ventricular systolic function is normal. There is normal pulmonary artery systolic pressure. The tricuspid regurgitant velocity is 2.36 m/s, and  with an assumed right atrial pressure of 3 mmHg, the estimated right ventricular systolic pressure is 09.4 mmHg. Left Atrium: Left atrial size was normal in size. Right Atrium: Right atrial size was mildly dilated. Pericardium: There is no evidence of pericardial effusion. Presence of epicardial fat layer. Mitral Valve: The mitral valve is normal in structure. Mild mitral annular calcification. Trivial mitral valve regurgitation. No evidence of mitral valve stenosis. Tricuspid Valve: The tricuspid valve is normal in structure. Tricuspid valve regurgitation is not demonstrated. No evidence of tricuspid stenosis. Aortic Valve: The aortic valve is normal in structure. Aortic valve regurgitation is not visualized. Aortic valve sclerosis is present, with no evidence of aortic valve stenosis. Pulmonic Valve: The pulmonic valve was normal in structure. Pulmonic valve regurgitation is not visualized. No evidence of pulmonic stenosis. Aorta: The aortic root is normal in size and structure. Venous: The inferior vena cava is normal in size with greater than 50% respiratory variability, suggesting right atrial pressure of 3 mmHg. IAS/Shunts: The interatrial septum appears to be lipomatous. No atrial level shunt detected by color flow Doppler.  LEFT VENTRICLE PLAX 2D LVIDd:  5.00 cm      Diastology LVIDs:         3.10 cm      LV e' medial:    6.83 cm/s LV PW:         1.00 cm      LV E/e' medial:  11.5 LV IVS:        1.20 cm      LV e' lateral:   7.73 cm/s LVOT diam:     2.00 cm      LV E/e' lateral: 10.2 LV SV:         89 LV SV Index:   51 LVOT Area:     3.14 cm  LV Volumes (MOD) LV vol d, MOD A2C: 115.0 ml LV vol d, MOD A4C: 108.0 ml LV vol s, MOD A2C: 41.1 ml LV vol s, MOD A4C: 38.7 ml LV SV MOD A2C:     73.9 ml LV SV MOD  A4C:     108.0 ml LV SV MOD BP:      72.7 ml RIGHT VENTRICLE RV S prime:     17.30 cm/s TAPSE (M-mode): 2.4 cm LEFT ATRIUM             Index        RIGHT ATRIUM           Index LA diam:        4.00 cm 2.31 cm/m   RA Area:     20.20 cm LA Vol (A2C):   49.1 ml 28.33 ml/m  RA Volume:   53.70 ml  30.99 ml/m LA Vol (A4C):   46.5 ml 26.83 ml/m LA Biplane Vol: 52.1 ml 30.06 ml/m  AORTIC VALVE LVOT Vmax:   110.00 cm/s LVOT Vmean:  73.900 cm/s LVOT VTI:    0.282 m  AORTA Ao Root diam: 3.20 cm Ao Asc diam:  3.60 cm MITRAL VALVE                TRICUSPID VALVE MV Area (PHT): 2.77 cm     TR Peak grad:   22.3 mmHg MV Decel Time: 274 msec     TR Vmax:        236.00 cm/s MV E velocity: 78.60 cm/s MV A velocity: 101.00 cm/s  SHUNTS MV E/A ratio:  0.78         Systemic VTI:  0.28 m                             Systemic Diam: 2.00 cm Kardie Tobb DO Electronically signed by Berniece Salines DO Signature Date/Time: 05/16/2022/12:25:38 PM    Final    CT ANGIO HEAD W OR WO CONTRAST  Result Date: 05/15/2022 CLINICAL DATA:  Stroke follow-up EXAM: CT ANGIOGRAPHY HEAD TECHNIQUE: Multidetector CT imaging of the head was performed using the standard protocol during bolus administration of intravenous contrast. Multiplanar CT image reconstructions and MIPs were obtained to evaluate the vascular anatomy. RADIATION DOSE REDUCTION: This exam was performed according to the departmental dose-optimization program which includes automated exposure control, adjustment of the mA and/or kV according to patient size and/or use of iterative reconstruction technique. CONTRAST:  37m OMNIPAQUE IOHEXOL 350 MG/ML SOLN COMPARISON:  Brain MRI obtained earlier the same day FINDINGS: CT HEAD Brain: Again seen is evolving acute to subacute infarct in the left occipital lobe. There is no evidence of hemorrhagic transformation. There is no evidence of new acute infarct. There is no  acute intracranial hemorrhage or extra-axial fluid collection. Remote lacunar  infarcts in the bilateral basal ganglia and thalami and background advanced chronic white matter microangiopathy are unchanged. The ventricles are stable in size. There is no mass lesion.  There is no mass effect or midline shift. Vascular: See below. Skull: Normal. Negative for fracture or focal lesion. Sinuses: Paranasal sinuses are clear. Other: Globes and orbits are unremarkable. CTA HEAD Anterior circulation: The imaged high cervical internal carotid arteries are patent. Intracranial internal carotid arteries are patent with scattered calcified plaque but no hemodynamically significant stenosis or occlusion. The bilateral MCAs are patent with mild atherosclerotic irregularity distally but no proximal high-grade stenosis or occlusion. The bilateral ACAs are patent without proximal high-grade stenosis or occlusion. The anterior communicating artery is normal. There is a 3 mm x 2 mm superiorly projecting aneurysm arising from the left paraclinoid ICA (12-74). There is an additional 1 mm inferiorly projecting outpouching arising from the communicating segment of the ICA which could reflect an additional tiny aneurysm (12-83). There is a 3 mm posterior inferiorly projecting outpouching arising from the right supraclinoid ICA suspicious for small aneurysm (13-84, 12-86). Posterior circulation: The bilateral V4 segments are patent. The left PICA origin is identified. The right PICA origin is not seen. The basilar artery is patent. The bilateral PCAs are patent with atherosclerotic irregularity distally but no proximal high-grade stenosis or occlusion. There is no aneurysm or AVM. Venous sinuses: Not well evaluated due to bolus timing. Anatomic variants: None. Review of the MIP images confirms the above findings. IMPRESSION: 1. Evolving infarct in the left occipital lobe without evidence of hemorrhagic transformation. No new acute intracranial pathology. 2. Patent intracranial vasculature with mild atherosclerotic  irregularity of the distal branches but no proximal high-grade stenosis or occlusion. 3. 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 Electronically Signed   By: Valetta Mole M.D.   On: 05/15/2022 16:42   CT US GUIDE VASC ACCESS RT NO REPORT  Result Date: 05/15/2022 There is no Radiologist interpretation  for this exam.  MR BRAIN WO CONTRAST  Result Date: 05/15/2022 CLINICAL DATA:  71 year old female status post fall, head trauma. Possible small acute cortical infarct on CT. EXAM: MRI HEAD WITHOUT CONTRAST TECHNIQUE: Multiplanar, multiecho pulse sequences of the brain and surrounding structures were obtained without intravenous contrast. COMPARISON:  Head and cervical spine CT yesterday. FINDINGS: Brain: Restricted diffusion in the left superior occipital lobe along the parieto-occipital sulcus corresponding to the CT finding yesterday. Only mild associated T2 and FLAIR hyperintense cytotoxic edema. No hemorrhage or mass effect. No other No restricted diffusion or evidence of acute infarction. There is a chronic microhemorrhage in the right occipital pole on SWI (series 14, image 25). Subtle chronic linear infarct in the left cerebellum series 10, image 7. And numerous chronic lacunar infarcts scattered in the bilateral deep gray nuclei. No other chronic cerebral blood products. Additional bilateral cerebral white matter T2 and FLAIR hyperintensity. No midline shift, mass effect, evidence of mass lesion, ventriculomegaly, extra-axial collection or acute intracranial hemorrhage. Cervicomedullary junction and pituitary are within normal limits. Patchy and scattered Vascular: Major intracranial vascular flow voids are preserved, with some generalized intracranial artery dolichoectasia. Skull and upper cervical spine: Partially visible cervical ACDF  hardware. Otherwise negative. Visualized bone marrow signal is within normal limits. Sinuses/Orbits: Negative orbits. Paranasal sinuses and  mastoids are stable and well aerated. Other: Visible internal auditory structures appear normal. Negative visible scalp and face. IMPRESSION: 1. Confirmed acute to subacute Left PCA territory infarct in the left occipital lobe. No associated hemorrhage or mass effect. 2. Underlying Advanced chronic small vessel disease. No other acute intracranial abnormality. Electronically Signed   By: Genevie Ann M.D.   On: 05/15/2022 11:51   CT CHEST ABDOMEN PELVIS W CONTRAST  Addendum Date: 05/14/2022   ADDENDUM REPORT: 05/14/2022 14:45 ADDENDUM: Based on the location of the pseudoaneurysm is near the ligamentum arteriosum the possibility of acute injury to a pre-existing pseudoaneurysm is considered and acute aortic injury is possible. For this reason thoracic or vascular surgery evaluation is suggested as outlined in the initial report. These results were called by telephone at the time of interpretation on 05/14/2022 at 2:45 pm to provider Dr. Roderic Palau, Who verbally acknowledged these results. Dr. Roderic Palau also to the initial call for this patient as outlined in the initial report. Electronically Signed   By: Zetta Bills M.D.   On: 05/14/2022 14:45   Result Date: 05/14/2022 CLINICAL DATA:  A 71 year old female presents for evaluation of trauma post fall. EXAM: CT CHEST, ABDOMEN, AND PELVIS WITH CONTRAST TECHNIQUE: Multidetector CT imaging of the chest, abdomen and pelvis was performed following the standard protocol during bolus administration of intravenous contrast. RADIATION DOSE REDUCTION: This exam was performed according to the departmental dose-optimization program which includes automated exposure control, adjustment of the mA and/or kV according to patient size and/or use of iterative reconstruction technique. CONTRAST:  77m OMNIPAQUE IOHEXOL 350 MG/ML SOLN COMPARISON:  Previous  imaging of the abdomen and pelvis. No dedicated imaging of the chest is available for comparison aside from previous cardiac imaging which was performed in December of 2019. FINDINGS: CT CHEST FINDINGS Cardiovascular: Calcified and noncalcified atheromatous plaque in the thoracic aorta. Focal outpouching from the aortic arch with subtle surrounding stranding, extending beyond confines of the thoracic aorta measuring 1.3 x 2.0 cm and without prior for comparison. Abundant calcified and noncalcified plaque with areas of marked plaque irregularity grossly similar to previous imaging with respect to imaged portions of the aorta. Second area of suspected penetrating atherosclerotic ulcer or developing pseudoaneurysm noted on image 36/3 but without surrounding stranding. Also seen on a image 59 of series 3 along the superior aspect of the thoracic aorta is a similar focal outpouching also without surrounding stranding. No signs of aortic dissection or additional suspected or potential acute process. Central pulmonary vessels are normal caliber. Mediastinum/Nodes: Mild stranding about the suspected pseudoaneurysm/penetrating ulcer that extends from the LEFT lateral aspect of the thoracic aorta. No thoracic inlet lymphadenopathy. No axillary lymphadenopathy. No mediastinal lymphadenopathy. No hilar lymphadenopathy. Esophagus is grossly normal. Lungs/Pleura: No consolidation. No pleural effusion. No pneumothorax. PICC PICC basilar atelectasis. Airways are patent. Musculoskeletal: No contusion over the body wall. See dedicated musculoskeletal section below for further detail. CT ABDOMEN PELVIS FINDINGS Hepatobiliary: Increasing biliary duct distension since previous imaging from 2021 both intra and extrahepatic biliary duct distension. Transition is at the level of the ampulla. No discrete abnormality is noted to explain these findings. Common bile duct is 14 mm greatest axial dimension which is similar though intrahepatic  biliary duct distension is moderate and increased since previous imaging. Pancreas: Pancreatic atrophy without inflammation or visible lesion. No ductal dilation. Spleen: Normal. Adrenals/Urinary Tract: Adrenal glands are normal. Normal RIGHT kidney. Atrophy of the upper pole the LEFT kidney. No hydronephrosis or suspicious renal lesion. Smooth contour  the urinary bladder. Stomach/Bowel: Query thickening of the ascending colon. No adjacent stranding. Sigmoid diverticulosis and diverticular changes without adjacent stranding. Appendix not visualized, no secondary signs to suggest acute appendicitis. No acute gastric or small bowel process. Vascular/Lymphatic: Aortic atherosclerosis both calcified and noncalcified in the abdominal aorta. No surrounding stranding. No aneurysmal dilation of the abdominal aorta. Reproductive: Post hysterectomy.  No adnexal masses. Other: No ascites. Musculoskeletal: Sclerosis along the RIGHT hemi sacrum. This shows a sub chondral bandlike distribution and was not present on previous imaging. The sacroiliac joints are intact. Symphysis pubis is intact. No sign of displaced fracture. No sign of displaced rib fracture.  Sternum is intact. Signs of L4-5 spinal fusion with grade 1 anterolisthesis of L4 on L5. Anterolisthesis new from imaging in 2021 and perhaps mildly increased compared to imaging from December of 2022, not changed compared to recent postoperative imaging and associated with interbody fusion. Small amount of fluid in the subcutaneous fat overlying the surgical site (image 84/3) 3 x 2.2 cm. The IMPRESSION: 1. 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 Based on appearance thoracic or vascular surgery consultation is suggested particularly if there is chest pain to exclude acute aortic syndrome relating to pseudoaneurysm. Etiology of this finding  may relate to underlying penetrating atherosclerotic ulcer based on morphology and presence of similar smaller areas that are seen in the aorta without surrounding stranding. 2. 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 3. Irregular and abundant soft plaque in the thoracic and abdominal aorta could be a risk for distal embolization. 4. Thickening of the ascending and transverse colon may reflect findings of colitis other previous or mild to moderate current colitis. There is no adjacent stranding or free fluid in the pelvis. Correlate with severity of abdominal trauma and consider repeat imaging if there is worsening of symptoms. Would also correlate with any symptoms of colitis or recent colitis. 5. Findings of RIGHT sacral insufficiency fracture. 6. Recent postoperative changes related to lumbar spinal fusion. Small nonspecific fluid in the subcutaneous fat. This potentially represents seroma should be correlated with any worsening symptoms in this location or signs infection. 7. Increasing biliary duct distension now with moderate intrahepatic biliary duct distension in similar common bile duct distension without clear cause. Correlate with any symptoms of biliary obstruction or RIGHT upper quadrant pain with further imaging on follow-up as warranted. These results were called by telephone at the time of interpretation on 05/14/2022 at 2:26 pm to provider [facility] , who verbally acknowledged these results. Electronically Signed: By: Zetta Bills M.D. On: 05/14/2022 14:26   CT T-SPINE NO CHARGE  Result Date: 05/14/2022 CLINICAL DATA:  Left leg pain after fall. Recent lower back surgery 2 weeks ago. EXAM: CT THORACIC AND LUMBAR SPINE WITHOUT CONTRAST TECHNIQUE: Multidetector CT imaging of the thoracic and lumbar spine was performed without intravenous contrast. Multiplanar CT image reconstructions were also generated. RADIATION DOSE  REDUCTION: This exam was performed according to the departmental dose-optimization program which includes automated exposure control, adjustment of the mA and/or kV according to patient size and/or use of iterative reconstruction technique. COMPARISON:  Lumbar spine x-rays dated [place] 30, 2023. CT lumbar myelogram dated October 25, 2021. FINDINGS: CT THORACIC SPINE FINDINGS Alignment: Slight dextrocurvature.  No significant listhesis. Vertebrae: No acute fracture or focal pathologic process. Paraspinal and other soft tissues: Please  see separate CT chest report from same day. Disc levels: Disc heights are preserved. Scattered facet arthropathy. No significant spinal canal or neuroforaminal stenosis. CT LUMBAR SPINE FINDINGS Segmentation: 5 lumbar type vertebrae. Alignment: No traumatic malalignment. Unchanged 8 mm anterolisthesis at L4-L5. Vertebrae: L4-L5 PLIF. No evidence of hardware failure or loosening. No acute fracture or focal pathologic process. Paraspinal and other soft tissues: Subacute appearing fracture of the right sacral ala with surrounding sclerosis, new since December. Disc levels: Unchanged mild disc bulging at L3-L4 and moderate disc bulging at L5-S1. L4-L5 PLIF with bilateral laminectomies. Unchanged moderate to severe right and mild left facet arthropathy at L5-S1. No significant spinal canal or neuroforaminal stenosis at any level. IMPRESSION: 1. No acute osseous abnormality of the thoracic or lumbar spine. 2. Subacute appearing fracture of the right sacral ala with surrounding sclerosis, new since December. 3. L4-L5 PLIF without evidence of hardware complication. Electronically Signed   By: Titus Dubin M.D.   On: 05/14/2022 14:06   CT L-SPINE NO CHARGE  Result Date: 05/14/2022 CLINICAL DATA:  Left leg pain after fall. Recent lower back surgery 2 weeks ago. EXAM: CT THORACIC AND LUMBAR SPINE WITHOUT CONTRAST TECHNIQUE: Multidetector CT imaging of the thoracic and lumbar spine was  performed without intravenous contrast. Multiplanar CT image reconstructions were also generated. RADIATION DOSE REDUCTION: This exam was performed according to the departmental dose-optimization program which includes automated exposure control, adjustment of the mA and/or kV according to patient size and/or use of iterative reconstruction technique. COMPARISON:  Lumbar spine x-rays dated [place] 30, 2023. CT lumbar myelogram dated October 25, 2021. FINDINGS: CT THORACIC SPINE FINDINGS Alignment: Slight dextrocurvature.  No significant listhesis. Vertebrae: No acute fracture or focal pathologic process. Paraspinal and other soft tissues: Please see separate CT chest report from same day. Disc levels: Disc heights are preserved. Scattered facet arthropathy. No significant spinal canal or neuroforaminal stenosis. CT LUMBAR SPINE FINDINGS Segmentation: 5 lumbar type vertebrae. Alignment: No traumatic malalignment. Unchanged 8 mm anterolisthesis at L4-L5. Vertebrae: L4-L5 PLIF. No evidence of hardware failure or loosening. No acute fracture or focal pathologic process. Paraspinal and other soft tissues: Subacute appearing fracture of the right sacral ala with surrounding sclerosis, new since December. Disc levels: Unchanged mild disc bulging at L3-L4 and moderate disc bulging at L5-S1. L4-L5 PLIF with bilateral laminectomies. Unchanged moderate to severe right and mild left facet arthropathy at L5-S1. No significant spinal canal or neuroforaminal stenosis at any level. IMPRESSION: 1. No acute osseous abnormality of the thoracic or lumbar spine. 2. Subacute appearing fracture of the right sacral ala with surrounding sclerosis, new since December. 3. L4-L5 PLIF without evidence of hardware complication. Electronically Signed   By: Titus Dubin M.D.   On: 05/14/2022 14:06   CT CERVICAL SPINE WO CONTRAST  Result Date: 05/14/2022 CLINICAL DATA:  Poly trauma, blunt.  Fell on the porch last night. EXAM: CT CERVICAL SPINE  WITHOUT CONTRAST TECHNIQUE: Multidetector CT imaging of the cervical spine was performed without intravenous contrast. Multiplanar CT image reconstructions were also generated. RADIATION DOSE REDUCTION: This exam was performed according to the departmental dose-optimization program which includes automated exposure control, adjustment of the mA and/or kV according to patient size and/or use of iterative reconstruction technique. COMPARISON:  None FINDINGS: Alignment: Straightening of the normal cervical lordosis. Degenerative type anterolisthesis at C4-5 of 2 mm. Skull base and vertebrae: Distant ACDF C5-C7. No evidence of regional fracture. Soft tissues and spinal canal: No traumatic soft tissue finding. Disc levels: Ordinary osteoarthritis  at the C1-2 articulation. C2-3 facet osteoarthritis without significant stenosis. C3-4 facet osteoarthritis and small uncovertebral osteophytes. Foraminal narrowing on the right that could possibly be symptomatic. C4-5 facet arthropathy with 2 mm of degenerative anterolisthesis. Bony foraminal narrowing on the right that could possibly be symptomatic. Solid union from C5 through C7 with wide patency of the canal and foramina. Some chronic bony foraminal narrowing at C5-6, not likely compressive. No significant C7-T1 finding. Upper chest: Negative Other: None IMPRESSION: No acute or traumatic finding. Chronic ACDF C5 through C7 with solid union and sufficient patency of the canal and foramina at those levels. C4-5 facet arthropathy with 2 mm of degenerative anterolisthesis. Bony foraminal narrowing on the right at C3-4 and C4-5. Chronic bony foraminal narrowing at C5-6. Electronically Signed   By: Nelson Chimes M.D.   On: 05/14/2022 13:53   CT HEAD WO CONTRAST  Result Date: 05/14/2022 CLINICAL DATA:  Head trauma, moderate-severe. Fell on the porch last night EXAM: CT HEAD WITHOUT CONTRAST TECHNIQUE: Contiguous axial images were obtained from the base of the skull through the  vertex without intravenous contrast. RADIATION DOSE REDUCTION: This exam was performed according to the departmental dose-optimization program which includes automated exposure control, adjustment of the mA and/or kV according to patient size and/or use of iterative reconstruction technique. COMPARISON:  09/12/2018 FINDINGS: Brain: No focal abnormality seen affecting the brainstem or cerebellum. Cerebral hemispheres show chronic small-vessel ischemic changes throughout the white matter, progressive since 2019. Old appearing small vessel infarctions of the right putamen and left caudate. Question acute/subacute infarction of the medial left parietooccipital junction. No hemorrhage, hydrocephalus or extra-axial collection. Vascular: There is atherosclerotic calcification of the major vessels at the base of the brain. Skull: Negative Sinuses/Orbits: Clear/normal Other: None IMPRESSION: No acute traumatic finding. Chronic small-vessel ischemic changes of the cerebral hemispheric white matter and basal ganglia, progressive since 2019. Suspicion of small acute cortical infarction in the medial left parietooccipital junction region without evidence of mass effect or hemorrhage. Consider confirmation with MRI. Electronically Signed   By: Nelson Chimes M.D.   On: 05/14/2022 13:48   DG Hip Unilat W or Wo Pelvis 2-3 Views Right  Result Date: 05/14/2022 CLINICAL DATA:  Fall, left lower extremity pain EXAM: DG HIP (WITH OR WITHOUT PELVIS) 2-3V RIGHT COMPARISON:  None Available. FINDINGS: No right hip fracture or dislocation. Small superior right greater trochanter enthesophytes. No significant right hip arthropathy. No suspicious focal osseous lesions. No radiopaque foreign bodies. IMPRESSION: No right hip fracture or dislocation. Electronically Signed   By: Ilona Sorrel M.D.   On: 05/14/2022 12:02   DG Hip Unilat W or Wo Pelvis 2-3 Views Left  Result Date: 05/14/2022 CLINICAL DATA:  Fall, left lower extremity pain EXAM: DG  HIP (WITH OR WITHOUT PELVIS) 2-3V LEFT COMPARISON:  None Available. FINDINGS: No pelvic fracture or diastasis. No left hip fracture or dislocation. No suspicious focal osseous lesions. Bilateral posterior spinal fusion hardware at L4-5. No significant left hip arthropathy. IMPRESSION: No left hip fracture or malalignment. Electronically Signed   By: Ilona Sorrel M.D.   On: 05/14/2022 12:01     Assessment and Plan:   PAD  Pt with penetrating ulcerations of descending thoracic aorta   - TCTS and VVS have seen, for repeat CT in am - BP control emphasized, SBP < 120, HR < 80  2. CVA  CT of head suspicious for acute L infaerc.  MRI with acute/subacute L PCA infarct     Sx started about  a week ago.  Neuro is OK for above parameters      3. HTN - Long hx of  BP that is difficult to controll  Pt intolerant to meds ALso have medication SE    REcomm:    Cut back on clonidine to 0.1 tid.    This is contributing to slow HR    Continue amlodipine  Continue carvedilol at current dose for now   Continue chlorthalidone   Need to watch K and renal function ARB was stopped  with bump in Cr    Follow   Could consider very low dose ARB with close f/u of renal function if BP goes up  4  CAD   No symptoms of angina   5  HL  Keep on statin and Zetia     6  REanl  CLose follow up of renal function.    Risk Assessment/Risk Scores:       For questions or updates, please contact [place] Please consult [email or web address] for contact info under    Signed, Rosaria Ferries, PA-C  05/17/2022 3:48 PM   Patient seen and examined   I agree with findings documented by R Barrett   I have amended plan to reflect my findings   Pt complains of HA sitting in bed Neck   JVP is normal   NObruits Lungs are relatively CTA Cardiac exam   RRR  No S3  No murmurs Abd is supple  Ext are without edema  2+ DP pulses    HTN   See above changes   to meds   Cut back on clonidine   May need to taper b blocker     Watch  electrolytes and kidney function     Will continue to follow closesly  Dorris Carnes MD

## 2022-05-17 NOTE — Progress Notes (Signed)
? ?  Inpatient Rehab Admissions Coordinator : ? ?Per therapy recommendations, patient was screened for CIR candidacy by Evanell Redlich RN MSN.  At this time patient appears to be a potential candidate for CIR. I will place a rehab consult per protocol for full assessment. Please call me with any questions. ? ?Mirranda Monrroy RN MSN ?Admissions Coordinator ?[phone] ?  ?

## 2022-05-17 NOTE — Progress Notes (Signed)
Physical Therapy Treatment Patient Details Name: Robin Arellano MRN: [id number] DOB: 11-May-1951 Today's Date: 05/17/2022   History of Present Illness Pt is a 71 y.o. female admitted 05/14/22 after falling backwards on her porch while using her cane. 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 MRI revealed acute/subacute left occipital infarct.  PMH: HTN, CAD, HLD, CKD, former smoker, depression, fibromyalgia, chronic back pain, recent L4-5 posterior lumbar fusion (04/26/22)    PT Comments    Pt with limited mobility progression this session with noted right hemianopsia, inattention, bradycardia, HA and fatigue all limiting function. Pt with significant change in deficits and would greatly benefit from AIR prior to return home given functional impact of her deficits in vision and awareness. Pt educated for brace wear with brace adjusted as spouse had taken it apart and improperly fitted it as well as back precautions which she could not recall. Will continue to follow.    Recommendations for follow up therapy are one component of a multi-disciplinary discharge planning process, led by the attending physician.  Recommendations may be updated based on patient status, additional functional criteria and insurance authorization.  Follow Up Recommendations  Acute inpatient rehab (3hours/day)     Assistance Recommended at Discharge Frequent or constant Supervision/Assistance  Patient can return home with the following A little help with walking and/or transfers;Assistance with cooking/housework;Direct supervision/assist for medications management;Direct supervision/assist for financial management;Assist for transportation;Help with stairs or ramp for entrance;A lot of help with bathing/dressing/bathroom   Equipment Recommendations  None recommended by PT    Recommendations for Other Services       Precautions / Restrictions Precautions Precautions: Fall;Back Precaution  Comments: reviewed 3/3 back precautions and brace wear Required Braces or Orthoses: Spinal Brace Spinal Brace: Lumbar corset;Applied in sitting position     Mobility  Bed Mobility Overal bed mobility: Needs Assistance Bed Mobility: Rolling, Sidelying to Sit Rolling: Min assist Sidelying to sit: Min assist       General bed mobility comments: cues, increased time and assst to roll left and rise    Transfers Overall transfer level: Needs assistance   Transfers: Sit to/from Stand Sit to Stand: Min assist           General transfer comment: assist to rise from bed, BSC and chair with cues for hand placement and posture. Stand pivot bed to bSC with RW due to urinary incontinence with assist for pericare and sock change    Ambulation/Gait Ambulation/Gait assistance: Min assist Gait Distance (Feet): 20 Feet Assistive device: Rolling walker (2 wheels) Gait Pattern/deviations: Shuffle, Trunk flexed, Narrow base of support   Gait velocity interpretation: <1.31 ft/sec, indicative of household ambulator   General Gait Details: pt with shuffling steps, assist to direct RW, lack of attention/awareness/vision to right side of visual field with max cues and assist to direct pt. Pt walked 10' reported feeling lightheaded with seated rest then 20' back to chair. pt maintains bradycardia with HR 38 with activity   Stairs             Wheelchair Mobility    Modified Rankin (Stroke Patients Only) Modified Rankin (Stroke Patients Only) Pre-Morbid Rankin Score: Moderate disability Modified Rankin: Moderately severe disability     Balance Overall balance assessment: Needs assistance   Sitting balance-Leahy Scale: Fair Sitting balance - Comments: static sitting with gradual right lean at times   Standing balance support: Bilateral upper extremity supported, During functional activity, Reliant on assistive device for balance Standing balance-Leahy Scale:  Poor Standing balance  comment: relies on RW and physical assist                            Cognition Arousal/Alertness: Awake/alert Behavior During Therapy: WFL for tasks assessed/performed Overall Cognitive Status: Impaired/Different from baseline Area of Impairment: Awareness, Problem solving, Safety/judgement, Attention, Memory                   Current Attention Level: Sustained Memory: Decreased short-term memory   Safety/Judgement: Decreased awareness of deficits, Decreased awareness of safety   Problem Solving: Slow processing, Requires verbal cues, Difficulty sequencing General Comments: pt with right hemianopsia and inattention with inability to recognize deficits or follow commands to turn head to view objects on right        Exercises      General Comments        Pertinent Vitals/Pain Pain Assessment Pain Score: 6  Pain Location: HA Pain Descriptors / Indicators: Aching, Guarding Pain Intervention(s): Limited activity within patient's tolerance, Monitored during session, Repositioned, Premedicated before session, Patient requesting pain meds-RN notified    Home Living                          Prior Function            PT Goals (current goals can now be found in the care plan section) Progress towards PT goals: Progressing toward goals    Frequency    Min 4X/week      PT Plan Discharge plan needs to be updated    Co-evaluation              AM-PAC PT "6 Clicks" Mobility   Outcome Measure  Help needed turning from your back to your side while in a flat bed without using bedrails?: A Little Help needed moving from lying on your back to sitting on the side of a flat bed without using bedrails?: A Little Help needed moving to and from a bed to a chair (including a wheelchair)?: A Lot Help needed standing up from a chair using your arms (e.g., wheelchair or bedside chair)?: A Little Help needed to walk in hospital room?: A Lot Help  needed climbing 3-5 steps with a railing? : Total 6 Click Score: 14    End of Session Equipment Utilized During Treatment: Back brace Activity Tolerance: Patient tolerated treatment well Patient left: in chair;with call bell/phone within reach;with nursing/sitter in room;with chair alarm set Nurse Communication: Mobility status;Precautions PT Visit Diagnosis: Unsteadiness on feet (R26.81);Muscle weakness (generalized) (M62.81);History of falling (Z91.81);Repeated falls (R29.6);Difficulty in walking, not elsewhere classified (R26.2);Dizziness and giddiness (R42)     Time: 1027-2536 PT Time Calculation (min) (ACUTE ONLY): 36 min  Charges:  $Gait Training: 8-22 mins $Therapeutic Activity: 8-22 mins                     Bayard Males, PT Acute Rehabilitation Services Office: [place] 05/17/2022, 12:19 PM

## 2022-05-18 ENCOUNTER — Inpatient Hospital Stay (HOSPITAL_COMMUNITY): Payer: PPO

## 2022-05-18 DIAGNOSIS — I729 Aneurysm of unspecified site: Secondary | ICD-10-CM | POA: Diagnosis not present

## 2022-05-18 LAB — GLUCOSE, CAPILLARY
Glucose-Capillary: 101 mg/dL — ABNORMAL HIGH (ref 70–99)
Glucose-Capillary: 112 mg/dL — ABNORMAL HIGH (ref 70–99)
Glucose-Capillary: 113 mg/dL — ABNORMAL HIGH (ref 70–99)
Glucose-Capillary: 95 mg/dL (ref 70–99)
Glucose-Capillary: 96 mg/dL (ref 70–99)
Glucose-Capillary: 99 mg/dL (ref 70–99)

## 2022-05-18 LAB — BLOOD GAS, ARTERIAL
Acid-base deficit: 3.8 mmol/L — ABNORMAL HIGH (ref 0.0–2.0)
Bicarbonate: 20.7 mmol/L (ref 20.0–28.0)
Drawn by: 24610
O2 Saturation: 99.5 %
Patient temperature: 37
pCO2 arterial: 35 mmHg (ref 32–48)
pH, Arterial: 7.38 (ref 7.35–7.45)
pO2, Arterial: 119 mmHg — ABNORMAL HIGH (ref 83–108)

## 2022-05-18 MED ORDER — CHLORTHALIDONE 25 MG PO TABS
25.0000 mg | ORAL_TABLET | Freq: Every day | ORAL | Status: DC
  Administered 2022-05-18 – 2022-05-22 (×5): 25 mg via ORAL
  Filled 2022-05-18 (×5): qty 1

## 2022-05-18 MED ORDER — OXYCODONE-ACETAMINOPHEN 5-325 MG PO TABS
1.0000 | ORAL_TABLET | Freq: Four times a day (QID) | ORAL | Status: DC | PRN
  Administered 2022-05-19 – 2022-05-22 (×11): 1 via ORAL
  Filled 2022-05-18 (×11): qty 1

## 2022-05-18 MED ORDER — CARVEDILOL 3.125 MG PO TABS
3.1250 mg | ORAL_TABLET | Freq: Two times a day (BID) | ORAL | Status: DC
  Administered 2022-05-18: 3.125 mg via ORAL
  Filled 2022-05-18 (×2): qty 1

## 2022-05-18 MED ORDER — CLONIDINE HCL 0.1 MG PO TABS
0.1000 mg | ORAL_TABLET | Freq: Every day | ORAL | Status: DC
  Administered 2022-05-19 – 2022-05-20 (×2): 0.1 mg via ORAL
  Filled 2022-05-18 (×2): qty 1

## 2022-05-18 NOTE — Progress Notes (Signed)
Progress Note  Patient Name: Ivanka Lowry Bowl Date of Encounter: 05/18/2022  [facility] Cardiologist: Shelva Majestic, MD   Subjective   Patient complains of a back ache   HA is mild    No CP   no SOB   Inpatient Medications    Scheduled Meds:  sodium chloride   Intravenous Once   amLODipine  10 mg Oral Daily   aspirin  81 mg Oral Daily   atorvastatin  40 mg Oral Daily   carvedilol  6.25 mg Oral BID WC   Chlorhexidine Gluconate Cloth  6 each Topical Daily   chlorthalidone  50 mg Oral Daily   cloNIDine  0.1 mg Oral TID   clopidogrel  75 mg Oral Daily   docusate sodium  100 mg Oral Daily   ezetimibe  10 mg Oral Daily   heparin injection (subcutaneous)  5,000 Units Subcutaneous Q8H   hydrALAZINE  100 mg Oral TID   insulin aspart  1-3 Units Subcutaneous Q4H   lidocaine  1 patch Transdermal Q24H   polyethylene glycol  17 g Oral Daily   topiramate  25 mg Oral BID   Continuous Infusions:  esmolol Stopped (05/17/22 0349)   PRN Meds: ALPRAZolam, butalbital-acetaminophen-caffeine, hydrALAZINE, lip balm, methocarbamol, mouth rinse, oxyCODONE-acetaminophen, senna   Vital Signs    Vitals:   05/18/22 0445 05/18/22 0500 05/18/22 0515 05/18/22 0530  BP: (!) 129/55 120/70 131/60 (!) 130/59  Pulse: (!) 46 (!) 45 (!) 43 (!) 43  Resp: 15 13    Temp:      TempSrc:      SpO2: 97% 96% 98% 99%  Weight:      Height:        Intake/Output Summary (Last 24 hours) at 05/18/2022 0602 Last data filed at 05/18/2022 0400 Gross per 24 hour  Intake 810 ml  Output 2150 ml  Net -1340 ml      05/17/2022    6:45 AM 05/15/2022    3:57 AM 05/14/2022   10:49 AM  Last 3 Weights  Weight (lbs) 159 lb 9.8 oz 154 lb 8.7 oz 133 lb  Weight (kg) 72.4 kg 70.1 kg 60.328 kg      Telemetry    SB 40s   - Personally Reviewed  ECG    No new  - Personally Reviewed  Physical Exam   GEN: No acute distress.   Neck: No JVD Cardiac: RRR, no murmurs Respiratory: Clear to auscultation bilaterally. GI:  Soft, nontender, non-distended  MS: No edema; No deformity. Neuro:  Nonfocal  Psych: Normal affect   Labs    High Sensitivity Troponin:  No results for input(s): "TROPONINIHS" in the last 720 hours.   Chemistry Recent Labs  Lab 05/14/22 1236 05/14/22 1250 05/15/22 0045 05/16/22 0224 05/17/22 0138  NA 138   < > 138 135 133*  K 4.5   < > 4.2 5.0 4.4  CL 108   < > 106 105 102  CO2 21*  --  23 18* 19*  GLUCOSE 87   < > 92 95 110*  BUN 19   < > 18 24* 27*  CREATININE 1.38*   < > 1.47* 1.61* 1.98*  CALCIUM 8.6*  --  8.5* 8.7* 8.5*  MG  --   --   --   --  2.3  PROT 6.2*  --   --   --   --   ALBUMIN 3.1*  --   --   --   --  AST 26  --   --   --   --   ALT 17  --   --   --   --   ALKPHOS 124  --   --   --   --   BILITOT 0.9  --   --   --   --   GFRNONAA 41*  --  38* 34* 27*  ANIONGAP 9  --  '9 12 12   '$ < > = values in this interval not displayed.    Lipids  Recent Labs  Lab 05/16/22 0224  CHOL 170  TRIG 140  HDL 41  LDLCALC 101*  CHOLHDL 4.1    Hematology Recent Labs  Lab 05/15/22 0045 05/16/22 0224 05/17/22 0138  WBC 9.2 8.6 6.5  RBC 2.70* 2.89* 2.66*  HGB 8.7* 9.3* 8.6*  HCT 26.9* 28.2* 26.0*  MCV 99.6 97.6 97.7  MCH 32.2 32.2 32.3  MCHC 32.3 33.0 33.1  RDW 14.6 14.1 14.0  PLT 375 376 350   Thyroid No results for input(s): "TSH", "FREET4" in the last 168 hours.  BNPNo results for input(s): "BNP", "PROBNP" in the last 168 hours.  DDimer No results for input(s): "DDIMER" in the last 168 hours.   Radiology    DG Chest Port 1 View  Result Date: 05/16/2022 CLINICAL DATA:  Dyspnea. EXAM: PORTABLE CHEST 1 VIEW COMPARISON:  September 22, 2018 FINDINGS: The heart size and mediastinal contours are within normal limits. There is marked severity calcification of the thoracic aorta. Both lungs are clear. A radiopaque fusion plate and screws are seen overlying the lower cervical spine. The visualized skeletal structures are unremarkable. IMPRESSION: No active  cardiopulmonary disease. Electronically Signed   By: Virgina Norfolk M.D.   On: 05/16/2022 19:28   ECHOCARDIOGRAM COMPLETE  Result Date: 05/16/2022    ECHOCARDIOGRAM REPORT   Patient Name:   SUANN KLIER Date of Exam: 05/16/2022 Medical Rec #:  [id number]            Height:       63.0 in Accession #:    [id number]           Weight:       154.5 lb Date of Birth:  Jan 07, 1951            BSA:          1.733 m Patient Age:    72 years             BP:           108/47 mmHg Patient Gender: F                    HR:           51 bpm. Exam Location:  Inpatient Procedure: 2D Echo, Cardiac Doppler and Color Doppler Indications:    Stroke I63.9  History:        Patient has prior history of Echocardiogram examinations, most                 recent 10/14/2018. Previous Myocardial Infarction and CAD; Risk                 Factors:Hypertension and Dyslipidemia.  Sonographer:    Bernadene Person RDCS Referring Phys: [place]  1. Left ventricular ejection fraction, by estimation, is 60 to 65%. The left ventricle has normal function. The left ventricle has no regional wall motion abnormalities. There is mild concentric left ventricular  hypertrophy. Left ventricular diastolic parameters are indeterminate.  2. Right ventricular systolic function is normal. The right ventricular size is normal. There is normal pulmonary artery systolic pressure.  3. Right atrial size was mildly dilated.  4. The mitral valve is normal in structure. Trivial mitral valve regurgitation. No evidence of mitral stenosis.  5. The aortic valve is normal in structure. Aortic valve regurgitation is not visualized. Aortic valve sclerosis is present, with no evidence of aortic valve stenosis.  6. The inferior vena cava is normal in size with greater than 50% respiratory variability, suggesting right atrial pressure of 3 mmHg. FINDINGS  Left Ventricle: Left ventricular ejection fraction, by estimation, is 60 to 65%. The left ventricle has  normal function. The left ventricle has no regional wall motion abnormalities. The left ventricular internal cavity size was normal in size. There is  mild concentric left ventricular hypertrophy. Left ventricular diastolic parameters are indeterminate. Right Ventricle: The right ventricular size is normal. No increase in right ventricular wall thickness. Right ventricular systolic function is normal. There is normal pulmonary artery systolic pressure. The tricuspid regurgitant velocity is 2.36 m/s, and  with an assumed right atrial pressure of 3 mmHg, the estimated right ventricular systolic pressure is 64.6 mmHg. Left Atrium: Left atrial size was normal in size. Right Atrium: Right atrial size was mildly dilated. Pericardium: There is no evidence of pericardial effusion. Presence of epicardial fat layer. Mitral Valve: The mitral valve is normal in structure. Mild mitral annular calcification. Trivial mitral valve regurgitation. No evidence of mitral valve stenosis. Tricuspid Valve: The tricuspid valve is normal in structure. Tricuspid valve regurgitation is not demonstrated. No evidence of tricuspid stenosis. Aortic Valve: The aortic valve is normal in structure. Aortic valve regurgitation is not visualized. Aortic valve sclerosis is present, with no evidence of aortic valve stenosis. Pulmonic Valve: The pulmonic valve was normal in structure. Pulmonic valve regurgitation is not visualized. No evidence of pulmonic stenosis. Aorta: The aortic root is normal in size and structure. Venous: The inferior vena cava is normal in size with greater than 50% respiratory variability, suggesting right atrial pressure of 3 mmHg. IAS/Shunts: The interatrial septum appears to be lipomatous. No atrial level shunt detected by color flow Doppler.  LEFT VENTRICLE PLAX 2D LVIDd:         5.00 cm      Diastology LVIDs:         3.10 cm      LV e' medial:    6.83 cm/s LV PW:         1.00 cm      LV E/e' medial:  11.5 LV IVS:        1.20  cm      LV e' lateral:   7.73 cm/s LVOT diam:     2.00 cm      LV E/e' lateral: 10.2 LV SV:         89 LV SV Index:   51 LVOT Area:     3.14 cm  LV Volumes (MOD) LV vol d, MOD A2C: 115.0 ml LV vol d, MOD A4C: 108.0 ml LV vol s, MOD A2C: 41.1 ml LV vol s, MOD A4C: 38.7 ml LV SV MOD A2C:     73.9 ml LV SV MOD A4C:     108.0 ml LV SV MOD BP:      72.7 ml RIGHT VENTRICLE RV S prime:     17.30 cm/s TAPSE (M-mode): 2.4 cm LEFT ATRIUM  Index        RIGHT ATRIUM           Index LA diam:        4.00 cm 2.31 cm/m   RA Area:     20.20 cm LA Vol (A2C):   49.1 ml 28.33 ml/m  RA Volume:   53.70 ml  30.99 ml/m LA Vol (A4C):   46.5 ml 26.83 ml/m LA Biplane Vol: 52.1 ml 30.06 ml/m  AORTIC VALVE LVOT Vmax:   110.00 cm/s LVOT Vmean:  73.900 cm/s LVOT VTI:    0.282 m  AORTA Ao Root diam: 3.20 cm Ao Asc diam:  3.60 cm MITRAL VALVE                TRICUSPID VALVE MV Area (PHT): 2.77 cm     TR Peak grad:   22.3 mmHg MV Decel Time: 274 msec     TR Vmax:        236.00 cm/s MV E velocity: 78.60 cm/s MV A velocity: 101.00 cm/s  SHUNTS MV E/A ratio:  0.78         Systemic VTI:  0.28 m                             Systemic Diam: 2.00 cm Kardie Tobb DO Electronically signed by Berniece Salines DO Signature Date/Time: 05/16/2022/12:25:38 PM    Final     Cardiac Studies   Echo   7.3.23    1. Left ventricular ejection fraction, by estimation, is 60 to 65%. The  left ventricle has normal function. The left ventricle has no regional  wall motion abnormalities. There is mild concentric left ventricular  hypertrophy. Left ventricular diastolic  parameters are indeterminate.   2. Right ventricular systolic function is normal. The right ventricular  size is normal. There is normal pulmonary artery systolic pressure.   3. Right atrial size was mildly dilated.   4. The mitral valve is normal in structure. Trivial mitral valve  regurgitation. No evidence of mitral stenosis.   5. The aortic valve is normal in structure. Aortic  valve regurgitation is  not visualized. Aortic valve sclerosis is present, with no evidence of  aortic valve stenosis.   6. The inferior vena cava is normal in size with greater than 50%  respiratory variability, suggesting right atrial pressure of 3 mmHg.   Patient Profile     Gabrille Kessa Fairbairn is a 71 y.o. female with a hx of CAD w/ RCA and LAD dz 2019 CT cardiac >> cath 11/2018 w/ no flow-limiting dz, HTN on mult meds, HLD (familial) intol Crestor (on Zetia), Cspine surgery 2021, Low back pain, who is being seen 05/17/2022 for the evaluation of BP control in the setting of aortic ulcerations at the request of Dr Carlis Abbott.  Assessment & Plan    1  HTN   Patient with long hx of resistent HTN    Had medicine intoelrances and side effects     Was on ARB as outpt    Held due to bump in renal function  Remains on amlodipine 10 mg , chlorthalidone 50 daily (new in hospital), hydralazine 100 bid  (tid makes her dizzy).  Clonidine dose was decreased yesterday from 0.3 tid to 0.1 tid (this is new med for her) Carvedilol (new this admit) was a 6.25    Impression/Recomm: SBP overall pretty good    Will back down on carvedilol to 3.125 given  bradycardia    Also cut back on chlorthalidone to 25    Cr has bumped up      2  PAD   Pt with ulcerations in descending aorta    Plan for CT today   Hydrate  3   CVA   s/p acute/subacutes infarcts    Neuro has seen   OK with current BPs  4  CAD  Pt denies CP   5  HL  Keep on statin and Zetia    6  REnal  BUN/Cr 27.1.98    For questions or updates, please contact [place] Please consult [email or web address] for contact info under        Signed, Dorris Carnes, MD  05/18/2022, 6:02 AM

## 2022-05-18 NOTE — Progress Notes (Addendum)
  Progress Note    05/18/2022 6:49 AM Hospital Day 4  Subjective:  wants to know if she can go home today.  Says she is feeling better.   Afebrile 748'O-707'E systolic  Vitals:   67/54/49 0615 05/18/22 0630  BP: (!) 140/52 (!) 120/58  Pulse: (!) 41 (!) 41  Resp: 13 15  Temp:    SpO2: 97% 96%    Physical Exam: General:  sitting up in bed eating breakfast in no distress Lungs:  non labored Extremities:  faintly palpable left AT and 2+ right DP and palpable radial pulses bilaterally  CBC    Component Value Date/Time   WBC 6.5 05/17/2022 0138   RBC 2.66 (L) 05/17/2022 0138   HGB 8.6 (L) 05/17/2022 0138   HGB 11.2 08/28/2021 1539   HCT 26.0 (L) 05/17/2022 0138   HCT 32.8 (L) 08/28/2021 1539   PLT 350 05/17/2022 0138   PLT 304 08/28/2021 1539   MCV 97.7 05/17/2022 0138   MCV 95 08/28/2021 1539   MCH 32.3 05/17/2022 0138   MCHC 33.1 05/17/2022 0138   RDW 14.0 05/17/2022 0138   RDW 12.5 08/28/2021 1539   LYMPHSABS 2.7 07/13/2020 1328   MONOABS 1.0 07/13/2020 1328   EOSABS 0.0 07/13/2020 1328   BASOSABS 0.1 07/13/2020 1328    BMET    Component Value Date/Time   NA 133 (L) 05/17/2022 0138   NA 136 08/28/2021 1539   K 4.4 05/17/2022 0138   CL 102 05/17/2022 0138   CO2 19 (L) 05/17/2022 0138   GLUCOSE 110 (H) 05/17/2022 0138   BUN 27 (H) 05/17/2022 0138   BUN 16 08/28/2021 1539   CREATININE 1.98 (H) 05/17/2022 0138   CALCIUM 8.5 (L) 05/17/2022 0138   GFRNONAA 27 (L) 05/17/2022 0138   GFRAA 56 (L) 10/13/2020 1212    INR No results found for: "INR"   Intake/Output Summary (Last 24 hours) at 05/18/2022 0649 Last data filed at 05/18/2022 0400 Gross per 24 hour  Intake 810 ml  Output 2150 ml  Net -1340 ml     Assessment/Plan:  71 y.o. female with PAU Hospital Day 4  -pt's pain is stable and BP controlled.   -no labs this am - will need CTA once renal function improves.  She did have 2.1L UOP past 24 hours.    Leontine Locket, PA-C [facility] [phone] 05/18/2022 6:49 AM  VASCULAR STAFF ADDENDUM: I agree with the above.  Repeat CT angiogram when Scr improved.  [name]. Stanford Breed, MD [facility] Phone Number: [phone] 05/18/2022 2:23 PM

## 2022-05-18 NOTE — PMR Pre-admission (Signed)
PMR Admission Coordinator Pre-Admission Assessment  Patient: Robin Arellano is an 71 y.o., female MRN: [id number] DOB: 03-Mar-1951 Height: 5\' 3"  (160 cm) Weight: 70 kg  Insurance Information HMO:     PPO: yes      PCP:      IPA:      80/20:      OTHER:  PRIMARY: [employer]      Policy#: [id number]      Subscriber: Patient CM Name:       Phone#:      Fax#:  Pre-Cert#: [id number]      Employer:  [employer] with H[employer] caled and stated Pt. Approved 7-11 for 7 days  Benefits:  Phone #: [phone]     Name:  Irene Shipper Date: 11/12/2021 - still active Deductible: no deductible OOP Max: $3,200 ($515 met) CIR: $295/day co-pay for days 1-6, $0/day days 7-90 SNF:  $0/day co-pay for days 1-20, $184/day co-pay for days 21-100; limited to 100 days/benefit period Outpatient: $15/visit co-pay; limited by medical necessity Home Health:  100% coverage DME: 80% coverage; 20% co-insurance Providers: In network   SECONDARY:       Policy#:      Phone#:   Development worker, community:       Phone#:   The Engineer, petroleum" for patients in Inpatient Rehabilitation Facilities with attached "Privacy Act [place] Records" was provided and verbally reviewed with: Patient  Emergency Contact Information Contact Information     Name Relation Home Work Mobile   Jackson Spouse [phone]  [phone]   Rachel Bo   [phone]       Current Medical History  Patient Admitting Diagnosis: aortic aneurism, CVA History of Present Illness: Robin Arellano is a 71 year old female who presented to the emergency department on 05/14/2022 after sustaining a fall at approximately 2 AM.  She had recently undergone back surgery and had taken a Robaxin for left leg pain.  She had stopped aspirin therapy prior to surgery and had not restarted it.  She fell backwards and denied loss of consciousness, headache or dizziness.  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  Cardiothoracic surgery consulted.  Admitted to ICU and started on esmolol for hypertensive emergency.  Vascular surgery was also consulted to evaluate her descending thoracic aortic disease.  She was seen by Dr. Stanford Breed on 7/4 with plans to repeat CT angiogram of chest abdomen and pelvis in 48 hours.  This was delayed due to acute kidney injury.  Arrangements made to perform this as outpatient.  No surgical intervention advised during hospitalization.  CT of the head revealed suspicious acute left infarct and MRI of the brain confirmed an acute/subacute left PCA infarct and neurology was consulted for stroke work-up.  Plavix and aspirin started on 7/5 for 3 weeks then aspirin alone.  She is tolerating heart healthy diet. The patient requires inpatient physical medicine and rehabilitation evaluations and treatment secondary to dysfunction due to all deficits secondary to acute left PCA infarct and recent back surgery. Complete NIHSS TOTAL: 1  Patient's medical record from [facility] has been reviewed by the rehabilitation admission coordinator and physician.  Past Medical History  Past Medical History:  Diagnosis Date   Anxiety    on meds   Back pain    Chronic female pelvic pain    Coronary artery disease    mild, non-obstructive 11/2018  Depression    on meds   Family history of adverse reaction to anesthesia    sister had difficulty waking up   Fibromyalgia    H/O leukocytosis    Headache    Hyperlipidemia    on meds   Hypertension    on meds   MI (myocardial infarction) ([place])    Pt states she did not have a MI- EKG was normal, was GERD   Osteoarthritis    on meds   Ovarian cyst, right    PONV (postoperative nausea and vomiting)    Post-operative nausea and vomiting    SVD (spontaneous vaginal delivery)    x 2   Vitamin D deficiency     Has the patient had major surgery  during 100 days prior to admission? No  Family History   family history includes Aneurysm in her father; COPD in her brother; Heart disease in her paternal grandfather; Other in her sister and sister; Skin cancer in her brother and brother; Uterine cancer (age of onset: 54) in her mother.  Current Medications  Current Facility-Administered Medications:    0.9 %  sodium chloride infusion (Manually program via Guardrails IV Fluids), , Intravenous, Once, Julian Hy, DO   ALPRAZolam Duanne Moron) tablet 0.5 mg, 0.5 mg, Oral, Daily PRN, Jennelle Human B, NP, 0.5 mg at 05/17/22 2150   amLODipine (NORVASC) tablet 10 mg, 10 mg, Oral, Daily, Jennelle Human B, NP, 10 mg at 05/18/22 6606   aspirin chewable tablet 81 mg, 81 mg, Oral, Daily, Beulah Gandy A, NP, 81 mg at 05/18/22 0959   atorvastatin (LIPITOR) tablet 40 mg, 40 mg, Oral, Daily, Julian Hy, DO, 40 mg at 05/17/22 1756   butalbital-acetaminophen-caffeine (FIORICET) 50-325-40 MG per tablet 1 tablet, 1 tablet, Oral, Q6H PRN, Julian Hy, DO, 1 tablet at 05/18/22 1002   carvedilol (COREG) tablet 3.125 mg, 3.125 mg, Oral, BID WC, Fay Records, MD, 3.125 mg at 05/18/22 3016   Chlorhexidine Gluconate Cloth 2 % PADS 6 each, 6 each, Topical, Daily, Julian Hy, DO, 6 each at 05/18/22 0959   chlorthalidone (HYGROTON) tablet 25 mg, 25 mg, Oral, Daily, Dorris Carnes V, MD, 25 mg at 05/18/22 0109   cloNIDine (CATAPRES) tablet 0.1 mg, 0.1 mg, Oral, TID, Barrett, Rhonda G, PA-C, 0.1 mg at 05/18/22 0959   clopidogrel (PLAVIX) tablet 75 mg, 75 mg, Oral, Daily, Shafer, Devon, NP, 75 mg at 05/18/22 0959   docusate sodium (COLACE) capsule 100 mg, 100 mg, Oral, Daily, Noemi Chapel P, DO, 100 mg at 05/18/22 0959   esmolol (BREVIBLOC) 2000 mg / 100 mL (20 mg/mL) infusion, 25-300 mcg/kg/min, Intravenous, Continuous, Elsie Lincoln, MD, Stopped at 05/17/22 0349   ezetimibe (ZETIA) tablet 10 mg, 10 mg, Oral, Daily, Jennelle Human B, NP, 10 mg at 05/18/22 0959    heparin injection 5,000 Units, 5,000 Units, Subcutaneous, Q8H, Julian Hy, DO, 5,000 Units at 05/18/22 1331   hydrALAZINE (APRESOLINE) injection 10 mg, 10 mg, Intravenous, Q4H PRN, Anders Simmonds, MD, 10 mg at 05/18/22 1046   hydrALAZINE (APRESOLINE) tablet 100 mg, 100 mg, Oral, TID, Elsie Lincoln, MD, 100 mg at 05/18/22 1330   insulin aspart (novoLOG) injection 1-3 Units, 1-3 Units, Subcutaneous, Q4H, Julian Hy, DO, 1 Units at 05/17/22 2044   lidocaine (LIDODERM) 5 % 1 patch, 1 patch, Transdermal, Q24H, Clark, Laura P, DO   lip balm (CARMEX) ointment, , Topical, PRN, Noemi Chapel P, DO   methocarbamol (ROBAXIN) tablet 500 mg, 500  mg, Oral, Q6H PRN, Jennelle Human B, NP, 500 mg at 05/18/22 0107   Oral care mouth rinse, 15 mL, Mouth Rinse, PRN, Noemi Chapel P, DO   oxyCODONE-acetaminophen (PERCOCET/ROXICET) 5-325 MG per tablet 1-2 tablet, 1-2 tablet, Oral, Q4H PRN, Elsie Lincoln, MD, 2 tablet at 05/18/22 1141   polyethylene glycol (MIRALAX / GLYCOLAX) packet 17 g, 17 g, Oral, Daily, Julian Hy, DO, 17 g at 05/18/22 4492   senna (SENOKOT) tablet 8.6 mg, 1 tablet, Oral, QHS PRN, Julian Hy, DO   topiramate (TOPAMAX) tablet 25 mg, 25 mg, Oral, BID, Shafer, Devon, NP, 25 mg at 05/18/22 0100  Patients Current Diet:  Diet Order             Diet Heart Room service appropriate? Yes; Fluid consistency: Thin  Diet effective now                   Precautions / Restrictions Precautions Precautions: Fall, Back Precaution Booklet Issued: Yes (comment) Precaution Comments: reviewed 3/3 back precautions and brace wear Spinal Brace: Lumbar corset, Applied in sitting position Restrictions Weight Bearing Restrictions: No Other Position/Activity Restrictions: Brace brought in by spouse   Has the patient had 2 or more falls or a fall with injury in the past year? Yes  Prior Activity Level Community (5-7x/wk): left the home daily per her report  Prior Functional Level Self Care:  Did the patient need help bathing, dressing, using the toilet or eating? Needed some help  Indoor Mobility: Did the patient need assistance with walking from room to room (with or without device)? Needed some help  Stairs: Did the patient need assistance with internal or external stairs (with or without device)? Needed some help  Functional Cognition: Did the patient need help planning regular tasks such as shopping or remembering to take medications? Needed some help  Patient Information Are you of Hispanic, Latino/a,or Spanish origin?: A. No, not of Hispanic, Latino/a, or Spanish origin What is your race?: A. White Do you need or want an interpreter to communicate with a doctor or health care staff?: 0. No  Patient's Response To:  Health Literacy and Transportation Is the patient able to respond to health literacy and transportation needs?: Yes Health Literacy - How often do you need to have someone help you when you read instructions, pamphlets, or other written material from your doctor or pharmacy?: Never In the past 12 months, has lack of transportation kept you from medical appointments or from getting medications?: No In the past 12 months, has lack of transportation kept you from meetings, work, or from getting things needed for daily living?: No  Development worker, international aid / Equipment Home Equipment: BSC/3in1, Civil engineer, contracting, Grab bars - toilet, Grab bars - tub/shower, Toilet riser, Conservation officer, nature (2 wheels), [employer] - single point  Prior Device Use: Indicate devices/aids used by the patient prior to current illness, exacerbation or injury? Walker  Current Functional Level Cognition  Arousal/Alertness: Awake/alert Overall Cognitive Status: Impaired/Different from baseline Current Attention Level: Sustained Orientation Level: Oriented X4 Safety/Judgement: Decreased awareness of deficits, Decreased awareness of safety General Comments: pt with right hemianopsia and inattention with  inability to recognize deficits or follow commands to turn head to view objects on right without max cues and repeated trials Memory: Impaired Memory Impairment: Retrieval deficit, Storage deficit Awareness: Impaired Awareness Impairment: Anticipatory impairment Problem Solving: Impaired Problem Solving Impairment: Verbal complex    Extremity Assessment (includes Sensation/Coordination)  Upper Extremity Assessment: Generalized weakness  Lower Extremity  Assessment: Defer to PT evaluation    ADLs  Grooming: Min guard, Wash/dry hands, Bed level Lower Body Dressing: Moderate assistance, Bed level    Mobility  Overal bed mobility: Needs Assistance Bed Mobility: Rolling, Sidelying to Sit Rolling: Min guard Sidelying to sit: Min assist Sit to sidelying: Min assist General bed mobility comments: cues, increased time and assist to roll left and rise with bed flat    Transfers  Overall transfer level: Needs assistance Equipment used: Rolling walker (2 wheels) Transfers: Sit to/from Stand Sit to Stand: Min assist General transfer comment: assist to rise from bed and BSC with cues for hand placement and posture. Stand pivot bed to [facility] with single UE support with urgent urination, pt able to perform pericare this session    Ambulation / Gait / Stairs / Wheelchair Mobility  Ambulation/Gait Ambulation/Gait assistance: Herbalist (Feet): 80 Feet Assistive device: Rolling walker (2 wheels) Gait Pattern/deviations: Shuffle, Trunk flexed, Narrow base of support General Gait Details: pt with maintained short shuffling steps with tendency to keep RW anterior right to body with max cues and min physical assist for proximity to RW. Pt running into obstacles x 6 in limited gait despite cues for scanning environment and direction. HR 45 during gait Gait velocity: decreaesd Gait velocity interpretation: <1.8 ft/sec, indicate of risk for recurrent falls    Posture / Balance Dynamic  Sitting Balance Sitting balance - Comments: static sitting without support Balance Overall balance assessment: Needs assistance Sitting-balance support: No upper extremity supported, Feet supported Sitting balance-Leahy Scale: Fair Sitting balance - Comments: static sitting without support Standing balance support: Bilateral upper extremity supported, During functional activity, Reliant on assistive device for balance Standing balance-Leahy Scale: Poor Standing balance comment: relies on RW and physical assist    Special needs/care consideration Skin intact   Previous Home Environment (from acute therapy documentation) Living Arrangements: Spouse/significant other, Children  Lives With: Spouse, Daughter Available Help at Discharge: Family, Available 24 hours/day Type of Home: House Home Layout: One level Home Access: Stairs to enter Entrance Stairs-Rails: Right, Left Entrance Stairs-Number of Steps: 5 Bathroom Shower/Tub: Multimedia programmer: Standard Bathroom Accessibility: Yes How Accessible: Accessible via walker Additional Comments: pt reports daughter and spouse live at home, sister can come assist as needed  Discharge Living Setting Plans for Discharge Living Setting: Patient's home Type of Home at Discharge: House Discharge Home Layout: One level Discharge Home Access: Stairs to enter Entrance Stairs-Rails: Right, Left Entrance Stairs-Number of Steps: 5 Discharge Bathroom Shower/Tub: Tub/shower unit Discharge Bathroom Toilet: Standard Discharge Bathroom Accessibility: Yes How Accessible: Accessible via walker Does the patient have any problems obtaining your medications?: No  Social/Family/Support Systems Patient Roles: Spouse, Parent Anticipated Caregiver: spouse, donald Anticipated Caregiver's Contact Information: [phone] Caregiver Availability: 24/7 Discharge Plan Discussed with Primary Caregiver: Yes Is Caregiver In Agreement with Plan?:  Yes Does Caregiver/Family have Issues with Lodging/Transportation while Pt is in Rehab?: No  Goals Patient/Family Goal for Rehab: supervision level Expected length of stay: 7-10 days Additional Information: patient required supervision prior to admission for safety Pt/Family Agrees to Admission and willing to participate: Yes Program Orientation Provided & Reviewed with Pt/Caregiver Including Roles  & Responsibilities: Yes  Decrease burden of Care through IP rehab admission: N/A  Possible need for SNF placement upon discharge: not anticipated  Patient Condition: I have reviewed medical records from [facility] , spoken with CM, and patient. I met with patient at the bedside for inpatient rehabilitation assessment.  Patient will benefit from ongoing PT and OT, can actively participate in 3 hours of therapy a day 5 days of the week, and can make measurable gains during the admission.  Patient will also benefit from the coordinated team approach during an Inpatient Acute Rehabilitation admission.  The patient will receive intensive therapy as well as Rehabilitation physician, nursing, social worker, and care management interventions.  Due to safety, skin/wound care, disease management, medication administration, pain management, and patient education the patient requires 24 hour a day rehabilitation nursing.  The patient is currently min A  with mobility and basic ADLs.  Discharge setting and therapy post discharge at home with home health is anticipated.  Patient has agreed to participate in the Acute Inpatient Rehabilitation Program and will admit today.  Preadmission Screen Completed By:  Amanda Cockayne, 05/18/2022 3:33 PM ______________________________________________________________________   Discussed status with Dr. Dagoberto Ligas on 05/22/22 at 11 and received approval for admission today.  Admission Coordinator:  Clemens Catholic, [place], [place] [facility] Admissions Coordinator, time  1400/Date 05/22/22   Assessment/Plan: Diagnosis: Does the need for close, 24 hr/day Medical supervision in concert with the patient's rehab needs make it unreasonable for this patient to be served in a less intensive setting? Yes Co-Morbidities requiring supervision/potential complications: L PCA infarct, R hemiparesis; HTN emergency; R hemianopsia Due to bladder management, bowel management, safety, skin/wound care, disease management, medication administration, pain management, and patient education, does the patient require 24 hr/day rehab nursing? Yes Does the patient require coordinated care of a physician, rehab nurse, PT, OT, and SLP to address physical and functional deficits in the context of the above medical diagnosis(es)? Yes Addressing deficits in the following areas: balance, endurance, locomotion, strength, transferring, bowel/bladder control, bathing, dressing, feeding, grooming, toileting, and cognition Can the patient actively participate in an intensive therapy program of at least 3 hrs of therapy 5 days a week? Yes The potential for patient to make measurable gains while on inpatient rehab is good Anticipated functional outcomes upon discharge from inpatient rehab: supervision PT, supervision OT, supervision SLP Estimated rehab length of stay to reach the above functional goals is: 7-10 days Anticipated discharge destination: Home 10. Overall Rehab/Functional Prognosis: good   MD Signature:

## 2022-05-18 NOTE — Progress Notes (Signed)
Inpatient Rehab Admissions Coordinator:    Met with pt. To discuss potential CIR admit. She states interest and indicates that family can provide 24/7 support at home.  I will follow and pursue for admission pending medical readiness and insurance auth.  Clemens Catholic, [place], [place] Admissions Coordinator  [phone] ([place]) [phone] (office)

## 2022-05-18 NOTE — Progress Notes (Signed)
Occupational Therapy Treatment Patient Details Name: Robin Arellano MRN: [id number] DOB: 1951-09-25 Today's Date: 05/18/2022   History of present illness Pt is a 71 y.o. female admitted 05/14/22 after falling backwards on her porch while using her cane. 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 MRI revealed acute/subacute left occipital infarct.  PMH: HTN, CAD, HLD, CKD, former smoker, depression, fibromyalgia, chronic back pain, recent L4-5 posterior lumbar fusion (04/26/22)   OT comments  Patient much more alert this date, but continues to be tired.  As a result, the severity of her visual impairments, and there impact on functional independence are acutely evident.  She does not pick up any items on her right visual field until it cross midline.  Her balance, mobility and coordination are all impacted.  AIR is highly recommended given her prior level of function, ability to tolerate 3+ hour of therapy, and motivation.  OT will continue to follow in the acute setting.     Recommendations for follow up therapy are one component of a multi-disciplinary discharge planning process, led by the attending physician.  Recommendations may be updated based on patient status, additional functional criteria and insurance authorization.    Follow Up Recommendations  Acute inpatient rehab (3hours/day)    Assistance Recommended at Discharge Frequent or constant Supervision/Assistance  Patient can return home with the following  A little help with walking and/or transfers;A lot of help with bathing/dressing/bathroom;Assistance with cooking/housework;Direct supervision/assist for medications management;Direct supervision/assist for financial management;Assist for transportation;Help with stairs or ramp for entrance   Equipment Recommendations  None recommended by OT    Recommendations for Other Services      Precautions / Restrictions Precautions Precautions:  Fall;Back Precaution Booklet Issued: Yes (comment) Precaution Comments: reviewed 3/3 back precautions and brace wear Required Braces or Orthoses: Spinal Brace Spinal Brace: Lumbar corset;Applied in sitting position Restrictions Weight Bearing Restrictions: No       Mobility Bed Mobility Overal bed mobility: Needs Assistance Bed Mobility: Sidelying to Sit, Sit to Sidelying   Sidelying to sit: Min assist     Sit to sidelying: Min assist      Transfers Overall transfer level: Needs assistance Equipment used: Rolling walker (2 wheels) Transfers: Sit to/from Stand Sit to Stand: Min assist                 Balance Overall balance assessment: Needs assistance Sitting-balance support: No upper extremity supported, Feet supported       Standing balance support: Reliant on assistive device for balance Standing balance-Leahy Scale: Poor                             ADL either performed or assessed with clinical judgement   ADL       Grooming: Wash/dry hands;Wash/dry face;Minimal assistance;Standing           Upper Body Dressing : Minimal assistance;Cueing for sequencing;Sitting   Lower Body Dressing: Moderate assistance;Sitting/lateral leans;Cueing for compensatory techniques   Toilet Transfer: Minimal assistance;Rolling walker (2 wheels);Ambulation                  Extremity/Trunk Assessment              Vision Patient Visual Report: Peripheral vision impairment;Blurring of vision Vision Assessment?: Yes Visual Fields: Right homonymous hemianopsia Depth Perception: Overshoots;Undershoots   Perception Perception Perception: Impaired Inattention/Neglect: Does not attend to right visual field   Praxis Praxis Praxis Impairment Details: Initiation  Cognition Arousal/Alertness: Awake/alert Behavior During Therapy: WFL for tasks assessed/performed                       Current Attention Level: Sustained, Selective        Awareness: Emergent Problem Solving: Requires verbal cues, Requires tactile cues                       General Comments  VSS on RA    Pertinent Vitals/ Pain       Pain Assessment Pain Assessment: Faces Faces Pain Scale: Hurts even more Pain Location: eye pain and back pain Pain Descriptors / Indicators: Aching, Guarding Pain Intervention(s): Monitored during session, Patient requesting pain meds-RN notified                                                          Frequency  Min 2X/week        Progress Toward Goals  OT Goals(current goals can now be found in the care plan section)  Progress towards OT goals: Progressing toward goals  Acute Rehab OT Goals OT Goal Formulation: With patient Time For Goal Achievement: 05/30/22 Potential to Achieve Goals: Good  Plan Discharge plan needs to be updated    Co-evaluation                 AM-PAC OT "6 Clicks" Daily Activity     Outcome Measure   Help from another person eating meals?: A Little Help from another person taking care of personal grooming?: A Little Help from another person toileting, which includes using toliet, bedpan, or urinal?: A Lot Help from another person bathing (including washing, rinsing, drying)?: A Lot Help from another person to put on and taking off regular upper body clothing?: A Lot Help from another person to put on and taking off regular lower body clothing?: A Lot 6 Click Score: 14    End of Session Equipment Utilized During Treatment: Rolling walker (2 wheels);Gait belt;Back brace  OT Visit Diagnosis: Other abnormalities of gait and mobility (R26.89);Muscle weakness (generalized) (M62.81);Pain;History of falling (Z91.81);Low vision, both eyes (H54.2);Other symptoms and signs involving cognitive function   Activity Tolerance Patient limited by pain   Patient Left in bed;with call bell/phone within reach;with family/visitor present   Nurse  Communication Mobility status        Time: 7741-2878 OT Time Calculation (min): 24 min  Charges: OT General Charges $OT Visit: 1 Visit OT Treatments $Self Care/Home Management : 23-37 mins  05/18/2022  RP, OTR/L  Acute Rehabilitation Services  Office:  [phone]   Metta Clines 05/18/2022, 4:00 PM

## 2022-05-18 NOTE — Progress Notes (Signed)
Physical Therapy Treatment Patient Details Name: Robin Arellano MRN: [id number] DOB: [birth year] Today's Date: 05/18/2022   History of Present Illness Pt is a 71 y.o. female admitted 05/14/22 after falling backwards on her porch while using her cane. 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 MRI revealed acute/subacute left occipital infarct.  PMH: HTN, CAD, HLD, CKD, former smoker, depression, fibromyalgia, chronic back pain, recent L4-5 posterior lumbar fusion (04/26/22)    PT Comments    PT with improved attention and gait from prior session. Pt able to state 2/3 back precautions with education for all as well as assist for donning brace. Pt able to state she has a visual deficit today but cannot recall and maintain compensatory techniques for functional use with mobility, gait, eating or reading signs in room. Pt required 12 attempts to attend to right side of sign in room to read with multimodal cues. Spouse present for session and in agreement to AIR.  HR 44-47 97% on RA BP 128/57 (77) supine Sitting 100/43 (58)  104/42 (61) post gait    Recommendations for follow up therapy are one component of a multi-disciplinary discharge planning process, led by the attending physician.  Recommendations may be updated based on patient status, additional functional criteria and insurance authorization.  Follow Up Recommendations  Acute inpatient rehab (3hours/day)     Assistance Recommended at Discharge Frequent or constant Supervision/Assistance  Patient can return home with the following A little help with walking and/or transfers;Assistance with cooking/housework;Direct supervision/assist for medications management;Direct supervision/assist for financial management;Assist for transportation;Help with stairs or ramp for entrance;A lot of help with bathing/dressing/bathroom   Equipment Recommendations  None recommended by PT    Recommendations for Other Services        Precautions / Restrictions Precautions Precautions: Fall;Back Precaution Booklet Issued: Yes (comment) Precaution Comments: reviewed 3/3 back precautions and brace wear Spinal Brace: Lumbar corset;Applied in sitting position     Mobility  Bed Mobility Overal bed mobility: Needs Assistance Bed Mobility: Rolling, Sidelying to Sit Rolling: Min guard Sidelying to sit: Min assist       General bed mobility comments: cues, increased time and assist to roll left and rise with bed flat    Transfers Overall transfer level: Needs assistance   Transfers: Sit to/from Stand Sit to Stand: Min assist           General transfer comment: assist to rise from bed and BSC with cues for hand placement and posture. Stand pivot bed to [facility] with single UE support with urgent urination, pt able to perform pericare this session    Ambulation/Gait Ambulation/Gait assistance: Min assist Gait Distance (Feet): 80 Feet Assistive device: Rolling walker (2 wheels) Gait Pattern/deviations: Shuffle, Trunk flexed, Narrow base of support   Gait velocity interpretation: <1.8 ft/sec, indicate of risk for recurrent falls   General Gait Details: pt with maintained short shuffling steps with tendency to keep RW anterior right to body with max cues and min physical assist for proximity to RW. Pt running into obstacles x 6 in limited gait despite cues for scanning environment and direction. HR 45 during gait   Stairs             Wheelchair Mobility    Modified Rankin (Stroke Patients Only) Modified Rankin (Stroke Patients Only) Pre-Morbid Rankin Score: Moderate disability Modified Rankin: Moderately severe disability     Balance Overall balance assessment: Needs assistance Sitting-balance support: No upper extremity supported, Feet supported Sitting balance-Leahy Scale: Fair  Sitting balance - Comments: static sitting without support   Standing balance support: Bilateral upper extremity  supported, During functional activity, Reliant on assistive device for balance Standing balance-Leahy Scale: Poor Standing balance comment: relies on RW and physical assist                            Cognition Arousal/Alertness: Awake/alert Behavior During Therapy: WFL for tasks assessed/performed Overall Cognitive Status: Impaired/Different from baseline Area of Impairment: Awareness, Problem solving, Safety/judgement, Attention, Memory                   Current Attention Level: Sustained Memory: Decreased short-term memory   Safety/Judgement: Decreased awareness of deficits, Decreased awareness of safety Awareness: Emergent Problem Solving: Slow processing, Requires verbal cues, Difficulty sequencing General Comments: pt with right hemianopsia and inattention with inability to recognize deficits or follow commands to turn head to view objects on right without max cues and repeated trials        Exercises      General Comments        Pertinent Vitals/Pain Pain Assessment Pain Score: 5  Pain Location: HA Pain Descriptors / Indicators: Aching, Guarding Pain Intervention(s): Limited activity within patient's tolerance, Monitored during session, Premedicated before session, Repositioned    Home Living                          Prior Function            PT Goals (current goals can now be found in the care plan section) Progress towards PT goals: Progressing toward goals    Frequency    Min 4X/week      PT Plan Current plan remains appropriate    Co-evaluation              AM-PAC PT "6 Clicks" Mobility   Outcome Measure  Help needed turning from your back to your side while in a flat bed without using bedrails?: A Little Help needed moving from lying on your back to sitting on the side of a flat bed without using bedrails?: A Little Help needed moving to and from a bed to a chair (including a wheelchair)?: A Lot Help needed  standing up from a chair using your arms (e.g., wheelchair or bedside chair)?: A Little Help needed to walk in hospital room?: A Lot Help needed climbing 3-5 steps with a railing? : Total 6 Click Score: 14    End of Session Equipment Utilized During Treatment: Back brace Activity Tolerance: Patient tolerated treatment well Patient left: in chair;with call bell/phone within reach;with nursing/sitter in room;with chair alarm set;with family/visitor present Nurse Communication: Mobility status;Precautions PT Visit Diagnosis: Unsteadiness on feet (R26.81);Muscle weakness (generalized) (M62.81);History of falling (Z91.81);Repeated falls (R29.6);Difficulty in walking, not elsewhere classified (R26.2);Dizziness and giddiness (R42)     Time: 8182-9937 PT Time Calculation (min) (ACUTE ONLY): 34 min  Charges:  $Gait Training: 8-22 mins $Therapeutic Activity: 8-22 mins                     Bayard Males, PT Acute Rehabilitation Services Office: [place] 05/18/2022, 1:36 PM

## 2022-05-18 NOTE — Progress Notes (Signed)
PROGRESS NOTE    Robin Arellano  YTK:[id number] DOB: 03/20/1951 DOA: 05/14/2022 PCP: Sandi Mariscal, MD     Brief Narrative:   H/o  fibromyalgia, chronic back pain, s/p recent L4-L5 posterior lumbar fusion onn 6/15 by Dr Ellene Route, h/o  CAD, HTN, HLD with statin intolerance,  CKDIII and former smoker who presents 7/3 after sustaining a fall in which her left leg gave out causing her to fall backwards.  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 MRI brain confirmed acute/subacute left PCA infarct She is started on esmolol drip and Admitted to icu due to hypertension emergency Seen by thoracic surgery, vascular surgery, neurology, cardiology Improving transfer to hospitalist service on 7/7  Subjective:  She is pleasant but slightly confused, she thinks this is afternoon Reports some back pain Husband at bedside   Assessment & Plan:  Principal Problem:   Pseudoaneurysm ([place]) Active Problems:   Hypertensive emergency   Hypertension emergency Off aspirin drip BP improving Appreciate cardiology input  pseudoaneurysm arising from lateral distal aortic arch without evidence of dissection, seen by thoracic surgery, bp control no indication for surgery penetrating atherosclerotic ulcer of the descending thoracic aorta.   Seen by vascular surgery "These appear to be asymptomatic at the moment.  Would recommend impulse control with systolic blood pressure less than 120 mmHg, and heart rate less than 80 bpm.  Plan to rescan with CT angiogram of chest, abdomen, and pelvis in 48 hours.  Would only consider intervention for rapidly enlarging ulcers, or symptomatic lesions."  subacute left PCA branch infarct   with left hemianopia Seen by neurology Etiology is likely embolic from cryptogenic source.  Recommend ongoing stroke evaluation and aggressive risk factor modification.  She  may need prolonged cardiac monitoring for paroxysmal A-fib.  Check  30-day heart monitor at discharge Aspirin Plavix for 3 weeks followed by aspirin alone.  AKI on CKD IIIb Hemodynamically related? Also received iv contrast initially for pan ct scan 2.1 liter urine output last 24hrs Will check ua, renal US  Anemia of chronic disease Does not appear to have overt bleeding Monitor Hgb    Falls/FTT: Seen by PT recommend CIR once medically stable  Acute versus subacute sacral ala fracture Lumbar fusion, L4-L5 Chronic pain, chronic opiate use - Resume PTA opiates with bowel regimen  Other incidental findings on total body CT scan: RIGHT sacral insufficiency fracture Thickening of the ascending and transverse colon may reflect findings of colitis   Increasing biliary duct distension now with moderate intrahepatic biliary duct distension in similar common bile duct distension without clear cause. Correlate with any symptoms of biliary obstruction or RIGHT upper quadrant pain with further imaging on follow-up as warranted.  Recent postoperative changes related to lumbar spinal fusion. Small nonspecific fluid in the subcutaneous fat. This potentially represents seroma should be correlated with any worsening symptoms in this location or signs infection.  I have Reviewed nursing notes, Vitals, pain scores, I/o's, Lab results and  imaging results since pt's last encounter, details please see discussion above  I ordered the following labs:  Unresulted Labs (From admission, onward)     Start     Ordered   05/18/22 0806  Urinalysis, Routine w reflex microscopic  Once,   R        05/18/22 0805             DVT prophylaxis: heparin injection 5,000  Units Start: 05/15/22 2200 SCDs Start: 05/14/22 1751   Code Status:   Code Status: Full Code  Family Communication:  Disposition:   Dispo: The patient is from: home              Anticipated d/c is to: CIR               Anticipated d/c date is: need renal function to improve, need CTA, needs cards and vascular surgery clearance   Antimicrobials:    Anti-infectives (From admission, onward)    None          Objective: Vitals:   05/18/22 0630 05/18/22 0700 05/18/22 0715 05/18/22 0745  BP: (!) 120/58  (!) 114/51 (!) 108/96  Pulse: (!) 41  (!) 40 (!) 40  Resp: '15  12 13  '$ Temp:  97.6 F (36.4 C)    TempSrc:  Oral    SpO2: 96%  95%   Weight:      Height:        Intake/Output Summary (Last 24 hours) at 05/18/2022 0806 Last data filed at 05/18/2022 0400 Gross per 24 hour  Intake 810 ml  Output 2150 ml  Net -1340 ml   Filed Weights   05/15/22 0357 05/17/22 0645 05/18/22 0500  Weight: 70.1 kg 72.4 kg 70 kg    Examination:  General exam: alert, pleasant, but appear slightly confused Respiratory system: Clear to auscultation. Respiratory effort normal. Cardiovascular system:  RRR.  Gastrointestinal system: Abdomen is nondistended, soft and nontender.  Normal bowel sounds heard. Central nervous system: Alert and oriented. No focal neurological deficits. Extremities:  no edema Skin: No rashes, lesions or ulcers Psychiatry: Judgement and insight appear normal. Mood & affect appropriate.     Data Reviewed: I have personally reviewed  labs and visualized  imaging studies since the last encounter and formulate the plan        Scheduled Meds:  sodium chloride   Intravenous Once   amLODipine  10 mg Oral Daily   aspirin  81 mg Oral Daily   atorvastatin  40 mg Oral Daily   carvedilol  3.125 mg Oral BID WC   Chlorhexidine Gluconate Cloth  6 each Topical Daily   chlorthalidone  50 mg Oral Daily   cloNIDine  0.1 mg Oral TID   clopidogrel  75 mg Oral Daily   docusate sodium  100 mg Oral Daily   ezetimibe  10 mg Oral Daily   heparin injection (subcutaneous)  5,000 Units Subcutaneous Q8H   hydrALAZINE  100 mg Oral TID   insulin aspart  1-3 Units Subcutaneous Q4H   lidocaine  1 patch  Transdermal Q24H   polyethylene glycol  17 g Oral Daily   topiramate  25 mg Oral BID   Continuous Infusions:  esmolol Stopped (05/17/22 0349)     LOS: 4 days      Florencia Reasons, MD PhD FACP [facility]  Available via Epic secure chat 7am-7pm for nonurgent issues Please page for urgent issues To page the attending provider between 7A-7P or the covering provider during after hours 7P-7A, please log into the web site [email or web address] and access using universal [place] password for that web site. If you do not have the password, please call the hospital operator.    05/18/2022, 8:06 AM

## 2022-05-18 NOTE — H&P (Incomplete)
Physical Medicine and Rehabilitation Admission H&P    Chief Complaint  Patient presents with   Fall   Back Pain  : HPI: ***  ROS Past Medical History:  Diagnosis Date   Anxiety    on meds   Back pain    Chronic female pelvic pain    Coronary artery disease    mild, non-obstructive 11/2018   Depression    on meds   Family history of adverse reaction to anesthesia    sister had difficulty waking up   Fibromyalgia    H/O leukocytosis    Headache    Hyperlipidemia    on meds   Hypertension    on meds   MI (myocardial infarction) ([place])    Pt states she did not have a MI- EKG was normal, was GERD   Osteoarthritis    on meds   Ovarian cyst, right    PONV (postoperative nausea and vomiting)    Post-operative nausea and vomiting    SVD (spontaneous vaginal delivery)    x 2   Vitamin D deficiency    Past Surgical History:  Procedure Laterality Date   ABDOMINAL HYSTERECTOMY  1994   TAH.BSO   ANTERIOR CERVICAL DECOMP/DISCECTOMY FUSION  2019   APPENDECTOMY  1975   BACK SURGERY  2023   BIOPSY  05/12/2020   Procedure: BIOPSY;  Surgeon: Milus Banister, MD;  Location: [facility];  Service: Endoscopy;;   CARDIAC CATHETERIZATION  2020   CHOLECYSTECTOMY N/A 07/21/2020   Procedure: LAPAROSCOPIC CHOLECYSTECTOMY WITH INTRAOPERATIVE CHOLANGIOGRAM;  Surgeon: [name], MD;  Location: [place];  Service: General;  Laterality: N/A;   COLONOSCOPY  08/12/2017   Hx TA (piecemeal)Jacobs-MAC-suprep (good)   ESOPHAGOGASTRODUODENOSCOPY (EGD) WITH PROPOFOL N/A 05/12/2020   Procedure: ESOPHAGOGASTRODUODENOSCOPY (EGD) WITH PROPOFOL;  Surgeon: Milus Banister, MD;  [facility];  Service: Endoscopy;  Laterality: N/A;   EUS N/A 05/12/2020   Procedure: UPPER ENDOSCOPIC ULTRASOUND (EUS) RADIAL;  Surgeon: Milus Banister, MD;  [facility];  Service: Endoscopy;  Laterality: N/A;   KNEE SURGERY Bilateral 1996   x 2 - arthroscopic   LEFT HEART CATH AND CORONARY  ANGIOGRAPHY N/A 11/25/2018   Procedure: LEFT HEART CATH AND CORONARY ANGIOGRAPHY;  Surgeon: Troy Sine, MD;  Location: [facility];  Service: Cardiovascular;  Laterality: N/A;   PELVIC LAPAROSCOPY  1989   W LYSIS OF ADHESIONS/L SALPINGONEOSTOMY   TUBAL LIGATION     WISDOM TOOTH EXTRACTION     Family History  Problem Relation Age of Onset   Uterine cancer Mother 53   Aneurysm Father    Heart disease Paternal Grandfather    COPD Brother    Skin cancer Brother    Other Sister        MGUS    Skin cancer Brother    Other Sister        MA   Colon cancer Neg Hx    Rectal cancer Neg Hx    Stomach cancer Neg Hx    Stroke Neg Hx    Neuropathy Neg Hx    Colon polyps Neg Hx    Esophageal cancer Neg Hx    Social History:  reports that she quit smoking about 23 years ago. Her smoking use included cigarettes. She has a 3.00 pack-year smoking history. She has never used smokeless tobacco. She reports that she does not currently use alcohol. She reports that she does not use drugs. Allergies:  Allergies  Allergen  Reactions   Flexeril [Cyclobenzaprine] Anaphylaxis, Hives, Itching and Swelling   Zanaflex [Tizanidine] Anaphylaxis, Hives, Itching and Swelling   Latex Rash   Medications Prior to Admission  Medication Sig Dispense Refill   ALPRAZolam (XANAX) 1 MG tablet Take 0.5 mg by mouth daily as needed for sleep or anxiety.     amLODipine (NORVASC) 10 MG tablet TAKE 1 TABLET(10 MG) BY MOUTH DAILY (Patient taking differently: Take 10 mg by mouth daily.) 90 tablet 3   Ascorbic Acid (VITAMIN C) 1000 MG tablet Take 1,000 mg by mouth in the morning.     aspirin EC 81 MG tablet Take 81 mg by mouth as needed (chest pain). Swallow whole.     Calcium Carbonate Antacid (TUMS PO) Take 2 tablets by mouth daily as needed (stomach pain).     Cholecalciferol (VITAMIN D) 50 MCG (2000 UT) tablet Take 2,000 Units by mouth in the morning.     ezetimibe (ZETIA) 10 MG tablet TAKE 1 TABLET(10 MG) BY  MOUTH DAILY (Patient taking differently: Take 10 mg by mouth daily.) 90 tablet 3   hydrALAZINE (APRESOLINE) 100 MG tablet Take 1 tablet (100 mg total) by mouth 3 (three) times daily. (Patient taking differently: Take 100 mg by mouth in the morning.) 270 tablet 1   Lidocaine (BLUE-EMU PAIN RELIEF DRY EX) Apply 1 application  topically as needed (pain).     methocarbamol (ROBAXIN) 500 MG tablet Take 1 tablet (500 mg total) by mouth every 6 (six) hours as needed for muscle spasms. 30 tablet 3   methylPREDNISolone (MEDROL DOSEPAK) 4 MG TBPK tablet Take 4 mg by mouth in the morning, at noon, in the evening, and at bedtime.     olmesartan (BENICAR) 40 MG tablet Take 1 tablet (40 mg total) by mouth daily. (Patient taking differently: Take 20 mg by mouth in the morning.) 90 tablet 3   oxyCODONE-acetaminophen (PERCOCET/ROXICET) 5-325 MG tablet Take 1-2 tablets by mouth every 4 (four) hours as needed for moderate pain or severe pain. (Patient taking differently: Take 1 tablet by mouth every 6 (six) hours as needed for moderate pain or severe pain.) 60 tablet 0   valACYclovir (VALTREX) 500 MG tablet TAKE 1 TABLET BY MOUTH TWICE DAILY FOR 3 TO 5 DAYS THEN TAKE DAILY AS NEEDED (Patient taking differently: Take 500 mg by mouth 2 (two) times daily as needed (shingles flair up).) 30 tablet 0   HYDROcodone-acetaminophen (NORCO) 10-325 MG tablet Take 1 tablet by mouth 3 (three) times daily as needed for moderate pain. (Patient not taking: Reported on 05/14/2022)     zolpidem (AMBIEN) 10 MG tablet Take 10 mg by mouth at bedtime as needed for sleep.  (Patient not taking: Reported on 05/14/2022)        Home: Home Living Family/patient expects to be discharged to:: Private residence Living Arrangements: Spouse/significant other, Children Available Help at Discharge: Family, Available 24 hours/day Type of Home: House Home Access: Stairs to enter [employer] of Steps: 5 Entrance Stairs-Rails: Right, Left Home  Layout: One level Bathroom Shower/Tub: Multimedia programmer: Standard Bathroom Accessibility: Yes Home Equipment: BSC/3in1, Shower seat, Grab bars - toilet, Grab bars - tub/shower, Toilet riser, Conservation officer, nature (2 wheels), [employer] - single point Additional Comments: pt reports daughter and spouse live at home, sister can come assist as needed  Lives With: Spouse, Daughter   Functional History: Prior Function Prior Level of Function : Needs assist, History of Falls (last six months) Physical Assist : Mobility (physical), ADLs (physical)  Mobility (physical): Transfers, Gait, Stairs ADLs (physical): Bathing, Dressing, Toileting, IADLs Mobility Comments: uses RW at all times, no longer uses cane due to falls ADLs Comments: pt reports daughter or sister assist with ADLs as needed  Functional Status:  Mobility: Bed Mobility Overal bed mobility: Needs Assistance Bed Mobility: Rolling, Sidelying to Sit Rolling: Min assist Sidelying to sit: Min assist Sit to sidelying: Min assist General bed mobility comments: cues, increased time and assst to roll left and rise Transfers Overall transfer level: Needs assistance Equipment used: Rolling walker (2 wheels) Transfers: Sit to/from Stand Sit to Stand: Min assist General transfer comment: assist to rise from bed, BSC and chair with cues for hand placement and posture. Stand pivot bed to bSC with RW due to urinary incontinence with assist for pericare and sock change Ambulation/Gait Ambulation/Gait assistance: Min assist Gait Distance (Feet): 20 Feet Assistive device: Rolling walker (2 wheels) Gait Pattern/deviations: Shuffle, Trunk flexed, Narrow base of support General Gait Details: pt with shuffling steps, assist to direct RW, lack of attention/awareness/vision to right side of visual field with max cues and assist to direct pt. Pt walked 10' reported feeling lightheaded with seated rest then 20' back to chair. pt maintains bradycardia  with HR 38 with activity Gait velocity: decreaesd Gait velocity interpretation: <1.31 ft/sec, indicative of household ambulator    ADL: ADL Grooming: Min guard, Wash/dry hands, Bed level Lower Body Dressing: Moderate assistance, Bed level  Cognition: Cognition Overall Cognitive Status: Impaired/Different from baseline Arousal/Alertness: Awake/alert Orientation Level: Oriented X4 Year: 2023 Month: July Day of Week: Correct Memory: Impaired Memory Impairment: Retrieval deficit, Storage deficit Awareness: Impaired Awareness Impairment: Anticipatory impairment Problem Solving: Impaired Problem Solving Impairment: Verbal complex Cognition Arousal/Alertness: Awake/alert Behavior During Therapy: WFL for tasks assessed/performed Overall Cognitive Status: Impaired/Different from baseline Area of Impairment: Awareness, Problem solving, Safety/judgement, Attention, Memory Current Attention Level: Sustained Memory: Decreased short-term memory Safety/Judgement: Decreased awareness of deficits, Decreased awareness of safety Awareness: Emergent Problem Solving: Slow processing, Requires verbal cues, Difficulty sequencing General Comments: pt with right hemianopsia and inattention with inability to recognize deficits or follow commands to turn head to view objects on right  Physical Exam: Blood pressure (!) 137/58, pulse (!) 47, temperature 97.6 F (36.4 C), temperature source Oral, resp. rate 15, height _0  (1.6 m), weight 70 kg, SpO2 96 %. Physical Exam  Results for orders placed or performed during the hospital encounter of 05/14/22 (from the past 48 hour(s))  Glucose, capillary     Status: Abnormal   Collection Time: 05/16/22  4:11 PM  Result Value Ref Range   Glucose-Capillary 136 (H) 70 - 99 mg/dL    Comment: Glucose reference range applies only to samples taken after fasting for at least 8 hours.  Glucose, capillary     Status: Abnormal   Collection Time: 05/16/22  7:46 PM   Result Value Ref Range   Glucose-Capillary 163 (H) 70 - 99 mg/dL    Comment: Glucose reference range applies only to samples taken after fasting for at least 8 hours.  Glucose, capillary     Status: Abnormal   Collection Time: 05/16/22 11:12 PM  Result Value Ref Range   Glucose-Capillary 112 (H) 70 - 99 mg/dL    Comment: Glucose reference range applies only to samples taken after fasting for at least 8 hours.  CBC     Status: Abnormal   Collection Time: 05/17/22  1:38 AM  Result Value Ref Range   WBC 6.5 4.0 - 10.5 K/uL  RBC 2.66 (L) 3.87 - 5.11 MIL/uL   Hemoglobin 8.6 (L) 12.0 - 15.0 g/dL   HCT 26.0 (L) 36.0 - 46.0 %   MCV 97.7 80.0 - 100.0 fL   MCH 32.3 26.0 - 34.0 pg   MCHC 33.1 30.0 - 36.0 g/dL   RDW 14.0 11.5 - 15.5 %   Platelets 350 150 - 400 K/uL   nRBC 0.0 0.0 - 0.2 %    Comment: Performed at [facility] [address], [place], [place] [address]  Basic metabolic panel     Status: Abnormal   Collection Time: 05/17/22  1:38 AM  Result Value Ref Range   Sodium 133 (L) 135 - 145 mmol/L   Potassium 4.4 3.5 - 5.1 mmol/L   Chloride 102 98 - 111 mmol/L   CO2 19 (L) 22 - 32 mmol/L   Glucose, Bld 110 (H) 70 - 99 mg/dL    Comment: Glucose reference range applies only to samples taken after fasting for at least 8 hours.   BUN 27 (H) 8 - 23 mg/dL   Creatinine, Ser 1.98 (H) 0.44 - 1.00 mg/dL   Calcium 8.5 (L) 8.9 - 10.3 mg/dL   GFR, Estimated 27 (L) >60 mL/min    Comment: (NOTE) Calculated using the CKD-EPI Creatinine Equation (2021)    Anion gap 12 5 - 15    Comment: Performed at [facility] [address], [place], [place] [address]  Magnesium     Status: None   Collection Time: 05/17/22  1:38 AM  Result Value Ref Range   Magnesium 2.3 1.7 - 2.4 mg/dL    Comment: Performed at [facility] [address], [place], [place] [address]  Glucose, capillary     Status: Abnormal   Collection Time: 05/17/22  3:31 AM  Result Value Ref Range    Glucose-Capillary 117 (H) 70 - 99 mg/dL    Comment: Glucose reference range applies only to samples taken after fasting for at least 8 hours.  Glucose, capillary     Status: Abnormal   Collection Time: 05/17/22  7:48 AM  Result Value Ref Range   Glucose-Capillary 134 (H) 70 - 99 mg/dL    Comment: Glucose reference range applies only to samples taken after fasting for at least 8 hours.  Glucose, capillary     Status: Abnormal   Collection Time: 05/17/22 11:14 AM  Result Value Ref Range   Glucose-Capillary 111 (H) 70 - 99 mg/dL    Comment: Glucose reference range applies only to samples taken after fasting for at least 8 hours.  Glucose, capillary     Status: Abnormal   Collection Time: 05/17/22  3:59 PM  Result Value Ref Range   Glucose-Capillary 117 (H) 70 - 99 mg/dL    Comment: Glucose reference range applies only to samples taken after fasting for at least 8 hours.  Glucose, capillary     Status: Abnormal   Collection Time: 05/17/22  8:09 PM  Result Value Ref Range   Glucose-Capillary 124 (H) 70 - 99 mg/dL    Comment: Glucose reference range applies only to samples taken after fasting for at least 8 hours.  Glucose, capillary     Status: None   Collection Time: 05/18/22 12:28 AM  Result Value Ref Range   Glucose-Capillary 99 70 - 99 mg/dL    Comment: Glucose reference range applies only to samples taken after fasting for at least 8 hours.  Glucose, capillary  Status: Abnormal   Collection Time: 05/18/22  4:44 AM  Result Value Ref Range   Glucose-Capillary 101 (H) 70 - 99 mg/dL    Comment: Glucose reference range applies only to samples taken after fasting for at least 8 hours.  Glucose, capillary     Status: None   Collection Time: 05/18/22  6:57 AM  Result Value Ref Range   Glucose-Capillary 96 70 - 99 mg/dL    Comment: Glucose reference range applies only to samples taken after fasting for at least 8 hours.  Glucose, capillary     Status: None   Collection Time: 05/18/22  11:17 AM  Result Value Ref Range   Glucose-Capillary 95 70 - 99 mg/dL    Comment: Glucose reference range applies only to samples taken after fasting for at least 8 hours.   US RENAL  Result Date: 05/18/2022 CLINICAL DATA:  [id number] increasing creatinine EXAM: RENAL / URINARY TRACT ULTRASOUND COMPLETE COMPARISON:  Ultrasound abdomen complete dated May 23, 2017 FINDINGS: Right Kidney: Renal measurements: 9.7 x 4.6 x 4.6 cm = volume: 110 mL. Echogenicity within normal limits. Mild thinning of the renal cortex. No mass or hydronephrosis visualized. Left Kidney: Renal measurements: 8.1 x 3.4 x 4.1 cm = volume: 59 mL. There is some thinning of the renal cortex seen. No mass or hydronephrosis visualized. Bladder: Appears normal for degree of bladder distention. Other: None. IMPRESSION: Size of the kidneys is within normal limits for the patient's age. There is some thinning of the renal cortices seen greater on the left. No mass or hydronephrosis seen. Electronically Signed   By: Frazier Richards M.D.   On: 05/18/2022 10:13   DG Chest Port 1 View  Result Date: 05/16/2022 CLINICAL DATA:  Dyspnea. EXAM: PORTABLE CHEST 1 VIEW COMPARISON:  September 22, 2018 FINDINGS: The heart size and mediastinal contours are within normal limits. There is marked severity calcification of the thoracic aorta. Both lungs are clear. A radiopaque fusion plate and screws are seen overlying the lower cervical spine. The visualized skeletal structures are unremarkable. IMPRESSION: No active cardiopulmonary disease. Electronically Signed   By: Virgina Norfolk M.D.   On: 05/16/2022 19:28      Blood pressure (!) 137/58, pulse (!) 47, temperature 97.6 F (36.4 C), temperature source Oral, resp. rate 15, height _0  (1.6 m), weight 70 kg, SpO2 96 %.  Medical Problem List and Plan: 1. Functional deficits secondary to ***  -patient may *** shower  -ELOS/Goals: *** 2.  Antithrombotics: -DVT/anticoagulation:  {VTE  PROPHYLAXIS/ANTICOAGULATION - EXBM:84132}  -antiplatelet therapy: *** 3. Pain Management: *** 4. Mood/Sleep: ***  -antipsychotic agents: *** 5. Neuropsych/cognition: This patient *** capable of making decisions on *** own behalf. 6. Skin/Wound Care: *** 7. Fluids/Electrolytes/Nutrition: ***     ***  Bary Leriche, PA-C 05/18/2022

## 2022-05-18 NOTE — Progress Notes (Signed)
   I was called with concerns about persistent bradycardia with heart rates in the high 30s. BP stable. Patient has been lethargic but this is not new for her. Discussed with Dr. Harrington Challenger who saw patient earlier today - will stop Coreg and decrease Clonidine to 0.'1mg'$  once daily.   Darreld Mclean, PA-C 05/18/2022 4:34 PM

## 2022-05-19 DIAGNOSIS — I729 Aneurysm of unspecified site: Secondary | ICD-10-CM | POA: Diagnosis not present

## 2022-05-19 DIAGNOSIS — R001 Bradycardia, unspecified: Secondary | ICD-10-CM | POA: Diagnosis not present

## 2022-05-19 DIAGNOSIS — I161 Hypertensive emergency: Secondary | ICD-10-CM | POA: Diagnosis not present

## 2022-05-19 LAB — COMPREHENSIVE METABOLIC PANEL
ALT: 15 U/L (ref 0–44)
AST: 25 U/L (ref 15–41)
Albumin: 3 g/dL — ABNORMAL LOW (ref 3.5–5.0)
Alkaline Phosphatase: 106 U/L (ref 38–126)
Anion gap: 7 (ref 5–15)
BUN: 25 mg/dL — ABNORMAL HIGH (ref 8–23)
CO2: 21 mmol/L — ABNORMAL LOW (ref 22–32)
Calcium: 8.7 mg/dL — ABNORMAL LOW (ref 8.9–10.3)
Chloride: 108 mmol/L (ref 98–111)
Creatinine, Ser: 1.95 mg/dL — ABNORMAL HIGH (ref 0.44–1.00)
GFR, Estimated: 27 mL/min — ABNORMAL LOW (ref >60)
Glucose, Bld: 97 mg/dL (ref 70–99)
Potassium: 3.9 mmol/L (ref 3.5–5.1)
Sodium: 136 mmol/L (ref 135–145)
Total Bilirubin: 0.5 mg/dL (ref 0.3–1.2)
Total Protein: 6.2 g/dL — ABNORMAL LOW (ref 6.5–8.1)

## 2022-05-19 LAB — CBC WITH DIFFERENTIAL/PLATELET
Abs Immature Granulocytes: 0.02 10*3/uL (ref 0.00–0.07)
Basophils Absolute: 0.1 10*3/uL (ref 0.0–0.1)
Basophils Relative: 1 %
Eosinophils Absolute: 0.1 10*3/uL (ref 0.0–0.5)
Eosinophils Relative: 2 %
HCT: 27.4 % — ABNORMAL LOW (ref 36.0–46.0)
Hemoglobin: 9 g/dL — ABNORMAL LOW (ref 12.0–15.0)
Immature Granulocytes: 0 %
Lymphocytes Relative: 30 %
Lymphs Abs: 2.1 10*3/uL (ref 0.7–4.0)
MCH: 32.5 pg (ref 26.0–34.0)
MCHC: 32.8 g/dL (ref 30.0–36.0)
MCV: 98.9 fL (ref 80.0–100.0)
Monocytes Absolute: 0.8 10*3/uL (ref 0.1–1.0)
Monocytes Relative: 11 %
Neutro Abs: 3.9 10*3/uL (ref 1.7–7.7)
Neutrophils Relative %: 56 %
Platelets: 311 10*3/uL (ref 150–400)
RBC: 2.77 MIL/uL — ABNORMAL LOW (ref 3.87–5.11)
RDW: 14.5 % (ref 11.5–15.5)
WBC: 6.9 10*3/uL (ref 4.0–10.5)
nRBC: 0 % (ref 0.0–0.2)

## 2022-05-19 LAB — URINALYSIS, ROUTINE W REFLEX MICROSCOPIC
Bilirubin Urine: NEGATIVE
Glucose, UA: NEGATIVE mg/dL
Hgb urine dipstick: NEGATIVE
Ketones, ur: NEGATIVE mg/dL
Leukocytes,Ua: NEGATIVE
Nitrite: NEGATIVE
Protein, ur: NEGATIVE mg/dL
Specific Gravity, Urine: 1.006 (ref 1.005–1.030)
pH: 5 (ref 5.0–8.0)

## 2022-05-19 LAB — GLUCOSE, CAPILLARY
Glucose-Capillary: 107 mg/dL — ABNORMAL HIGH (ref 70–99)
Glucose-Capillary: 99 mg/dL (ref 70–99)
Glucose-Capillary: 119 mg/dL — ABNORMAL HIGH (ref 70–99)
Glucose-Capillary: 97 mg/dL (ref 70–99)
Glucose-Capillary: 102 mg/dL — ABNORMAL HIGH (ref 70–99)

## 2022-05-19 MED ORDER — NYSTATIN 100000 UNIT/ML MT SUSP
5.0000 mL | Freq: Four times a day (QID) | OROMUCOSAL | Status: DC
  Administered 2022-05-19 – 2022-05-22 (×12): 500000 [IU] via ORAL
  Filled 2022-05-19 (×12): qty 5

## 2022-05-19 MED ORDER — SENNA 8.6 MG PO TABS
1.0000 | ORAL_TABLET | Freq: Every day | ORAL | Status: DC
  Administered 2022-05-19 – 2022-05-20 (×2): 8.6 mg via ORAL
  Filled 2022-05-19 (×3): qty 1

## 2022-05-19 MED ORDER — ONDANSETRON HCL 4 MG/2ML IJ SOLN
4.0000 mg | Freq: Four times a day (QID) | INTRAMUSCULAR | Status: DC | PRN
  Administered 2022-05-19 (×2): 4 mg via INTRAVENOUS
  Filled 2022-05-19 (×2): qty 2

## 2022-05-19 NOTE — Progress Notes (Signed)
Progress Note  Patient Name: Robin Arellano Bowl Date of Encounter: 05/19/2022  [facility] Cardiologist: Shelva Majestic, MD   Subjective   Remains bradycardic- BB stopped and clonidine has been decreased. BP soft.  Inpatient Medications    Scheduled Meds:  sodium chloride   Intravenous Once   amLODipine  10 mg Oral Daily   aspirin  81 mg Oral Daily   atorvastatin  40 mg Oral Daily   chlorthalidone  25 mg Oral Daily   cloNIDine  0.1 mg Oral Daily   clopidogrel  75 mg Oral Daily   docusate sodium  100 mg Oral Daily   ezetimibe  10 mg Oral Daily   heparin injection (subcutaneous)  5,000 Units Subcutaneous Q8H   hydrALAZINE  100 mg Oral TID   insulin aspart  1-3 Units Subcutaneous Q4H   lidocaine  1 patch Transdermal Q24H   polyethylene glycol  17 g Oral Daily   topiramate  25 mg Oral BID   Continuous Infusions:  esmolol Stopped (05/17/22 0349)   PRN Meds: ALPRAZolam, butalbital-acetaminophen-caffeine, hydrALAZINE, lip balm, methocarbamol, mouth rinse, oxyCODONE-acetaminophen, senna   Vital Signs    Vitals:   05/18/22 2013 05/18/22 2359 05/19/22 0433 05/19/22 0802  BP: (!) 114/49 (!) 99/51 (!) 126/48 (!) 105/53  Pulse: (!) 43 (!) 43 (!) 42 (!) 44  Resp: '15 17 13 18  '$ Temp: 97.6 F (36.4 C) 98 F (36.7 C) 98.3 F (36.8 C) 97.9 F (36.6 C)  TempSrc: Oral Oral Oral Oral  SpO2: 99% 98% 99% 100%  Weight:      Height:        Intake/Output Summary (Last 24 hours) at 05/19/2022 1112 Last data filed at 05/19/2022 0553 Gross per 24 hour  Intake 220 ml  Output 1750 ml  Net -1530 ml      05/18/2022    5:00 AM 05/17/2022    6:45 AM 05/15/2022    3:57 AM  Last 3 Weights  Weight (lbs) 154 lb 5.2 oz 159 lb 9.8 oz 154 lb 8.7 oz  Weight (kg) 70 kg 72.4 kg 70.1 kg      Telemetry    SB 40s   - Personally Reviewed  ECG    No new  - Personally Reviewed  Physical Exam   GEN: No acute distress.   Neck: No JVD Cardiac: RRR, no murmurs Respiratory: Clear to  auscultation bilaterally. GI: Soft, nontender, non-distended  MS: No edema; No deformity. Neuro:  Nonfocal  Psych: Normal affect   Labs    High Sensitivity Troponin:  No results for input(s): "TROPONINIHS" in the last 720 hours.   Chemistry Recent Labs  Lab 05/14/22 1236 05/14/22 1250 05/16/22 0224 05/17/22 0138 05/19/22 0050  NA 138   < > 135 133* 136  K 4.5   < > 5.0 4.4 3.9  CL 108   < > 105 102 108  CO2 21*   < > 18* 19* 21*  GLUCOSE 87   < > 95 110* 97  BUN 19   < > 24* 27* 25*  CREATININE 1.38*   < > 1.61* 1.98* 1.95*  CALCIUM 8.6*   < > 8.7* 8.5* 8.7*  MG  --   --   --  2.3  --   PROT 6.2*  --   --   --  6.2*  ALBUMIN 3.1*  --   --   --  3.0*  AST 26  --   --   --  25  ALT 17  --   --   --  15  ALKPHOS 124  --   --   --  106  BILITOT 0.9  --   --   --  0.5  GFRNONAA 41*   < > 34* 27* 27*  ANIONGAP 9   < > '12 12 7   '$ < > = values in this interval not displayed.    Lipids  Recent Labs  Lab 05/16/22 0224  CHOL 170  TRIG 140  HDL 41  LDLCALC 101*  CHOLHDL 4.1    Hematology Recent Labs  Lab 05/16/22 0224 05/17/22 0138 05/19/22 0050  WBC 8.6 6.5 6.9  RBC 2.89* 2.66* 2.77*  HGB 9.3* 8.6* 9.0*  HCT 28.2* 26.0* 27.4*  MCV 97.6 97.7 98.9  MCH 32.2 32.3 32.5  MCHC 33.0 33.1 32.8  RDW 14.1 14.0 14.5  PLT 376 350 311   Thyroid No results for input(s): "TSH", "FREET4" in the last 168 hours.  BNPNo results for input(s): "BNP", "PROBNP" in the last 168 hours.  DDimer No results for input(s): "DDIMER" in the last 168 hours.   Radiology    US RENAL  Result Date: 05/18/2022 CLINICAL DATA:  [id number] increasing creatinine EXAM: RENAL / URINARY TRACT ULTRASOUND COMPLETE COMPARISON:  Ultrasound abdomen complete dated May 23, 2017 FINDINGS: Right Kidney: Renal measurements: 9.7 x 4.6 x 4.6 cm = volume: 110 mL. Echogenicity within normal limits. Mild thinning of the renal cortex. No mass or hydronephrosis visualized. Left Kidney: Renal measurements: 8.1 x 3.4 x 4.1 cm  = volume: 59 mL. There is some thinning of the renal cortex seen. No mass or hydronephrosis visualized. Bladder: Appears normal for degree of bladder distention. Other: None. IMPRESSION: Size of the kidneys is within normal limits for the patient's age. There is some thinning of the renal cortices seen greater on the left. No mass or hydronephrosis seen. Electronically Signed   By: Frazier Richards M.D.   On: 05/18/2022 10:13    Cardiac Studies   Echo   7.3.23    1. Left ventricular ejection fraction, by estimation, is 60 to 65%. The  left ventricle has normal function. The left ventricle has no regional  wall motion abnormalities. There is mild concentric left ventricular  hypertrophy. Left ventricular diastolic  parameters are indeterminate.   2. Right ventricular systolic function is normal. The right ventricular  size is normal. There is normal pulmonary artery systolic pressure.   3. Right atrial size was mildly dilated.   4. The mitral valve is normal in structure. Trivial mitral valve  regurgitation. No evidence of mitral stenosis.   5. The aortic valve is normal in structure. Aortic valve regurgitation is  not visualized. Aortic valve sclerosis is present, with no evidence of  aortic valve stenosis.   6. The inferior vena cava is normal in size with greater than 50%  respiratory variability, suggesting right atrial pressure of 3 mmHg.   Patient Profile     Robin Arellano is a 71 y.o. female with a hx of CAD w/ RCA and LAD dz 2019 CT cardiac >> cath 11/2018 w/ no flow-limiting dz, HTN on mult meds, HLD (familial) intol Crestor (on Zetia), Cspine surgery 2021, Low back pain, who is being seen 05/17/2022 for the evaluation of BP control in the setting of aortic ulcerations at the request of Dr Carlis Abbott.  Assessment & Plan    1  HTN   Patient with long hx of resistent HTN  Had medicine intoelrances and side effects     Was on ARB as outpt    Held due to bump in renal function   Remains on amlodipine 10 mg , chlorthalidone 50 daily (new in hospital), hydralazine 100 bid  (tid makes her dizzy).  Clonidine dose was decreased yesterday from 0.3 tid to 0.1 tid (this is new med for her) Carvedilol stopped d/t bradycardia -repeat EKG today.   2  PAD   Pt with ulcerations in descending aorta    Plan for CT today   Hydrate  3   CVA   s/p acute/subacutes infarcts    Neuro has seen   OK with current BPs  4  CAD  Pt denies CP   5  HL  Keep on statin and Zetia    6  REnal  BUN/Cr 27.1.98    For questions or updates, please contact [place] Please consult [email or web address] for contact info under   Pixie Casino, MD, FACC, [place] Director of the [facility] Diplomate of the American Board of Clinical Lipidology Attending Cardiologist  Direct Dial: [phone]  Fax: [phone]  Website:  [email or web address]  Pixie Casino, MD  05/19/2022, 11:12 AM

## 2022-05-19 NOTE — Progress Notes (Addendum)
[facility]  Subjective  - Lumbar pain s/p surgery, no abdominal pain, SOB or CP.   Objective (!) 105/53 (!) 44 97.9 F (36.6 C) (Oral) 18 100%  Intake/Output Summary (Last 24 hours) at 05/19/2022 0838 Last data filed at 05/19/2022 0553 Gross per 24 hour  Intake 370 ml  Output 1750 ml  Net -1380 ml   Doppler signals AT/DP B LE Lungs non labored breathing, abdomin soft General no acute distress   Assessment/Planning: 71 y.o. female with PAU Hospital Day 5  Pending Cr function improvements we will order another CTA Cr 05/19/22 1.98, good urine OP >1,7700 last 24 hours.  Roxy Horseman 05/19/2022 8:38 AM --  VASCULAR STAFF ADDENDUM: I have independently interviewed and examined the patient. I agree with the above.  Rescan CTA chest when creatinine improves.   Cassandria Santee, MD [facility] Phone Number: [phone] 05/19/2022 10:42 AM    Laboratory Lab Results: Recent Labs    05/17/22 0138 05/19/22 0050  WBC 6.5 6.9  HGB 8.6* 9.0*  HCT 26.0* 27.4*  PLT 350 311   BMET Recent Labs    05/17/22 0138 05/19/22 0050  NA 133* 136  K 4.4 3.9  CL 102 108  CO2 19* 21*  GLUCOSE 110* 97  BUN 27* 25*  CREATININE 1.98* 1.95*  CALCIUM 8.5* 8.7*    COAG No results found for: "INR", "PROTIME" No results found for: "PTT"

## 2022-05-19 NOTE — Progress Notes (Signed)
PROGRESS NOTE    Robin Arellano  KWI:[id number] DOB: 10-06-1951 DOA: 05/14/2022 PCP: Sandi Mariscal, MD     Brief Narrative:   H/o  fibromyalgia, chronic back pain, s/p recent L4-L5 posterior lumbar fusion onn 6/15 by Dr Ellene Route, h/o  CAD, HTN, HLD with statin intolerance,  CKDIII and former smoker who presents 7/3 after sustaining a fall in which her left leg gave out causing her to fall backwards.  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 MRI brain confirmed acute/subacute left PCA infarct She is started on esmolol drip and Admitted to icu due to hypertension emergency Seen by thoracic surgery, vascular surgery, neurology, cardiology Improving transfer to hospitalist service on 7/7  Subjective:  Bradycardia in the 24Q, systolic blood pressure 683/41 Creatinine 1.95, urine output 1.75 L last 24 hours documented She is pleasant but slightly confused, but can be reoriented,  Reports feeling better, able to see better, able to more right side better Sisters at bedside    Assessment & Plan:  Principal Problem:   Pseudoaneurysm ([place]) Active Problems:   Hypertensive emergency   Hypertension emergency Off esmolol drip BP improving, goals of sbp <120 Appreciate cardiology input  pseudoaneurysm arising from lateral distal aortic arch without evidence of dissection, seen by thoracic surgery, bp control, no indication for surgery penetrating atherosclerotic ulcer of the descending thoracic aorta.   Seen by vascular surgery "These appear to be asymptomatic at the moment.  Would recommend impulse control with systolic blood pressure less than 120 mmHg, and heart rate less than 80 bpm.  Plan to rescan with CT angiogram of chest, abdomen, and pelvis in 48 hours.  Would only consider intervention for rapidly enlarging ulcers, or symptomatic lesions."  subacute left PCA branch  infarct   with left hemianopia Seen by neurology Etiology is likely embolic from cryptogenic source.  Recommend ongoing stroke evaluation and aggressive risk factor modification.  She may need prolonged cardiac monitoring for paroxysmal A-fib.  Check  30-day heart monitor at discharge Aspirin Plavix for 3 weeks followed by aspirin alone. Vision impairment and right-sided weakness has improved  AKI on CKD IIIb Hemodynamically related? Also received iv contrast initially for pan ct scan Good urine output Renal ultrasound no obstructive nephropathy Ua/urine culture pending collection   Anemia of chronic disease Does not appear to have overt bleeding Hgb stable around 9 Monitor Hgb    Falls/FTT: Seen by PT recommend CIR once medically stable  Acute versus subacute sacral ala fracture Lumbar fusion, L4-L5 Chronic pain, chronic opiate use - Resume PTA opiates with bowel regimen  Other incidental findings on total body CT scan: RIGHT sacral insufficiency fracture Thickening of the ascending and transverse colon may reflect findings of colitis   Increasing biliary duct distension now with moderate intrahepatic biliary duct distension in similar common bile duct distension without clear cause. Correlate with any symptoms of biliary obstruction or RIGHT upper quadrant pain with further imaging on follow-up as warranted.  Recent postoperative changes related to lumbar spinal fusion. Small nonspecific fluid in the subcutaneous fat. This potentially represents seroma should be correlated with any worsening symptoms in this location or signs infection.  I have Reviewed nursing notes, Vitals, pain scores, I/o's, Lab results and  imaging results since pt's last encounter, details please see discussion above  I ordered the following labs:  Unresulted Labs (From admission, onward)     Start  Ordered   05/20/22 0867  Basic metabolic panel  Daily,   R     Question:  Specimen  collection method  Answer:  Lab=Lab collect   05/19/22 1826   05/19/22 1827  Urinalysis, Routine w reflex microscopic  Once,   R        05/19/22 1826   05/19/22 1827  Urine Culture  (Urine Culture)  Once,   R       Question:  Indication  Answer:  Altered mental status (if no other cause identified)   05/19/22 1826   05/18/22 0806  Urinalysis, Routine w reflex microscopic  Once,   R        05/18/22 0805             DVT prophylaxis: heparin injection 5,000 Units Start: 05/15/22 2200 SCDs Start: 05/14/22 1751   Code Status:   Code Status: Full Code  Family Communication: sister at bedside  Disposition:   Dispo: The patient is from: home              Anticipated d/c is to: CIR              Anticipated d/c date is: need renal function to improve, need CTA, needs cards and vascular surgery clearance   Antimicrobials:    Anti-infectives (From admission, onward)    None          Objective: Vitals:   05/19/22 0802 05/19/22 1110 05/19/22 1239 05/19/22 1515  BP: (!) 105/53 (!) 103/48 (!) 128/52 (!) 122/47  Pulse: (!) 44 (!) 48  (!) 48  Resp: '18 18 16 20  '$ Temp: 97.9 F (36.6 C)   97.7 F (36.5 C)  TempSrc: Oral   Oral  SpO2: 100% 99%  98%  Weight:      Height:        Intake/Output Summary (Last 24 hours) at 05/19/2022 1834 Last data filed at 05/19/2022 1700 Gross per 24 hour  Intake 360 ml  Output 1050 ml  Net -690 ml   Filed Weights   05/15/22 0357 05/17/22 0645 05/18/22 0500  Weight: 70.1 kg 72.4 kg 70 kg    Examination:  General exam: alert, pleasant, but appear slightly confused Respiratory system: Clear to auscultation. Respiratory effort normal. Cardiovascular system:  RRR.  Gastrointestinal system: Abdomen is nondistended, soft and nontender.  Normal bowel sounds heard. Central nervous system: Slightly confused, but able to be reoriented, + impaired visual field, right side weakness, both are improving Extremities:  no edema Skin: No rashes, lesions  or ulcers Psychiatry: Calm and cooperative    Data Reviewed: I have personally reviewed  labs and visualized  imaging studies since the last encounter and formulate the plan        Scheduled Meds:  sodium chloride   Intravenous Once   amLODipine  10 mg Oral Daily   aspirin  81 mg Oral Daily   atorvastatin  40 mg Oral Daily   chlorthalidone  25 mg Oral Daily   cloNIDine  0.1 mg Oral Daily   clopidogrel  75 mg Oral Daily   docusate sodium  100 mg Oral Daily   ezetimibe  10 mg Oral Daily   heparin injection (subcutaneous)  5,000 Units Subcutaneous Q8H   hydrALAZINE  100 mg Oral TID   insulin aspart  1-3 Units Subcutaneous Q4H   lidocaine  1 patch Transdermal Q24H   nystatin  5 mL Oral QID   polyethylene glycol  17 g Oral Daily  senna  1 tablet Oral QHS   topiramate  25 mg Oral BID   Continuous Infusions:     LOS: 5 days      Florencia Reasons, MD PhD FACP [facility]  Available via Epic secure chat 7am-7pm for nonurgent issues Please page for urgent issues To page the attending provider between 7A-7P or the covering provider during after hours 7P-7A, please log into the web site [email or web address] and access using universal [place] password for that web site. If you do not have the password, please call the hospital operator.    05/19/2022, 6:34 PM

## 2022-05-20 DIAGNOSIS — I161 Hypertensive emergency: Secondary | ICD-10-CM | POA: Diagnosis not present

## 2022-05-20 DIAGNOSIS — R001 Bradycardia, unspecified: Secondary | ICD-10-CM | POA: Diagnosis not present

## 2022-05-20 DIAGNOSIS — I729 Aneurysm of unspecified site: Secondary | ICD-10-CM | POA: Diagnosis not present

## 2022-05-20 LAB — CULTURE, BLOOD (ROUTINE X 2)
Culture: NO GROWTH
Culture: NO GROWTH
Special Requests: ADEQUATE
Special Requests: ADEQUATE

## 2022-05-20 LAB — BASIC METABOLIC PANEL
Anion gap: 11 (ref 5–15)
BUN: 28 mg/dL — ABNORMAL HIGH (ref 8–23)
CO2: 20 mmol/L — ABNORMAL LOW (ref 22–32)
Calcium: 8.7 mg/dL — ABNORMAL LOW (ref 8.9–10.3)
Chloride: 102 mmol/L (ref 98–111)
Creatinine, Ser: 2 mg/dL — ABNORMAL HIGH (ref 0.44–1.00)
GFR, Estimated: 26 mL/min — ABNORMAL LOW (ref >60)
Glucose, Bld: 95 mg/dL (ref 70–99)
Potassium: 4 mmol/L (ref 3.5–5.1)
Sodium: 133 mmol/L — ABNORMAL LOW (ref 135–145)

## 2022-05-20 MED ORDER — FLEET ENEMA 7-19 GM/118ML RE ENEM: 1.0000 | ENEMA | Freq: Every day | RECTAL | Status: DC | PRN

## 2022-05-20 MED ORDER — BISACODYL 10 MG RE SUPP
10.0000 mg | Freq: Every day | RECTAL | Status: AC
  Filled 2022-05-20: qty 1

## 2022-05-20 MED ORDER — CLONIDINE HCL 0.1 MG PO TABS
0.1000 mg | ORAL_TABLET | Freq: Every day | ORAL | Status: DC
Start: 2022-05-21 — End: 2022-05-22
  Administered 2022-05-21: 0.1 mg via ORAL
  Filled 2022-05-20: qty 1

## 2022-05-20 MED ORDER — LACTATED RINGERS IV SOLN: INTRAVENOUS | Status: DC

## 2022-05-20 NOTE — Progress Notes (Addendum)
Progress Note  Patient Name: Robin Arellano Date of Encounter: 05/20/2022  [facility] Cardiologist: Shelva Majestic, MD   Subjective   HR remains in the 40's.  BP stable. EKG shows sinus bradycardia / artifact, no ischemic changes. BP normal.   Inpatient Medications    Scheduled Meds:  sodium chloride   Intravenous Once   amLODipine  10 mg Oral Daily   aspirin  81 mg Oral Daily   atorvastatin  40 mg Oral Daily   chlorthalidone  25 mg Oral Daily   cloNIDine  0.1 mg Oral Daily   clopidogrel  75 mg Oral Daily   docusate sodium  100 mg Oral Daily   ezetimibe  10 mg Oral Daily   heparin injection (subcutaneous)  5,000 Units Subcutaneous Q8H   hydrALAZINE  100 mg Oral TID   lidocaine  1 patch Transdermal Q24H   nystatin  5 mL Oral QID   polyethylene glycol  17 g Oral Daily   senna  1 tablet Oral QHS   topiramate  25 mg Oral BID   Continuous Infusions:   PRN Meds: ALPRAZolam, butalbital-acetaminophen-caffeine, hydrALAZINE, lip balm, methocarbamol, ondansetron (ZOFRAN) IV, mouth rinse, oxyCODONE-acetaminophen   Vital Signs    Vitals:   05/19/22 2347 05/20/22 0355 05/20/22 0900 05/20/22 1156  BP: (!) 110/49 (!) 141/53 (!) 122/46 (!) 121/46  Pulse: (!) 52 (!) 56 (!) 46 (!) 46  Resp: '14 13 15 16  '$ Temp: 97.9 F (36.6 C) 98.1 F (36.7 C) 98.1 F (36.7 C) 97.9 F (36.6 C)  TempSrc: Oral Oral Oral Oral  SpO2: 99% 96% 95% 96%  Weight:      Height:        Intake/Output Summary (Last 24 hours) at 05/20/2022 1317 Last data filed at 05/20/2022 0100 Gross per 24 hour  Intake 360 ml  Output 550 ml  Net -190 ml      05/18/2022    5:00 AM 05/17/2022    6:45 AM 05/15/2022    3:57 AM  Last 3 Weights  Weight (lbs) 154 lb 5.2 oz 159 lb 9.8 oz 154 lb 8.7 oz  Weight (kg) 70 kg 72.4 kg 70.1 kg      Telemetry    SB 40s   - Personally Reviewed  ECG    Sinus bradycardia with artifact - Personally Reviewed  Physical Exam   GEN: No acute distress.   Neck: No  JVD Cardiac: RRR, no murmurs Respiratory: Clear to auscultation bilaterally. GI: Soft, nontender, non-distended  MS: No edema; No deformity. Neuro:  Nonfocal  Psych: Normal affect   Labs    High Sensitivity Troponin:  No results for input(s): "TROPONINIHS" in the last 720 hours.   Chemistry Recent Labs  Lab 05/14/22 1236 05/14/22 1250 05/17/22 0138 05/19/22 0050 05/20/22 0053  NA 138   < > 133* 136 133*  K 4.5   < > 4.4 3.9 4.0  CL 108   < > 102 108 102  CO2 21*   < > 19* 21* 20*  GLUCOSE 87   < > 110* 97 95  BUN 19   < > 27* 25* 28*  CREATININE 1.38*   < > 1.98* 1.95* 2.00*  CALCIUM 8.6*   < > 8.5* 8.7* 8.7*  MG  --   --  2.3  --   --   PROT 6.2*  --   --  6.2*  --   ALBUMIN 3.1*  --   --  3.0*  --  AST 26  --   --  25  --   ALT 17  --   --  15  --   ALKPHOS 124  --   --  106  --   BILITOT 0.9  --   --  0.5  --   GFRNONAA 41*   < > 27* 27* 26*  ANIONGAP 9   < > '12 7 11   '$ < > = values in this interval not displayed.    Lipids  Recent Labs  Lab 05/16/22 0224  CHOL 170  TRIG 140  HDL 41  LDLCALC 101*  CHOLHDL 4.1    Hematology Recent Labs  Lab 05/16/22 0224 05/17/22 0138 05/19/22 0050  WBC 8.6 6.5 6.9  RBC 2.89* 2.66* 2.77*  HGB 9.3* 8.6* 9.0*  HCT 28.2* 26.0* 27.4*  MCV 97.6 97.7 98.9  MCH 32.2 32.3 32.5  MCHC 33.0 33.1 32.8  RDW 14.1 14.0 14.5  PLT 376 350 311   Thyroid No results for input(s): "TSH", "FREET4" in the last 168 hours.  BNPNo results for input(s): "BNP", "PROBNP" in the last 168 hours.  DDimer No results for input(s): "DDIMER" in the last 168 hours.   Radiology    No results found.  Cardiac Studies   Echo   7.3.23    1. Left ventricular ejection fraction, by estimation, is 60 to 65%. The  left ventricle has normal function. The left ventricle has no regional  wall motion abnormalities. There is mild concentric left ventricular  hypertrophy. Left ventricular diastolic  parameters are indeterminate.   2. Right ventricular  systolic function is normal. The right ventricular  size is normal. There is normal pulmonary artery systolic pressure.   3. Right atrial size was mildly dilated.   4. The mitral valve is normal in structure. Trivial mitral valve  regurgitation. No evidence of mitral stenosis.   5. The aortic valve is normal in structure. Aortic valve regurgitation is  not visualized. Aortic valve sclerosis is present, with no evidence of  aortic valve stenosis.   6. The inferior vena cava is normal in size with greater than 50%  respiratory variability, suggesting right atrial pressure of 3 mmHg.   Patient Profile     Robin Arellano is a 71 y.o. female with a hx of CAD w/ RCA and LAD dz 2019 CT cardiac >> cath 11/2018 w/ no flow-limiting dz, HTN on mult meds, HLD (familial) intol Crestor (on Zetia), Cspine surgery 2021, Low back pain, who is being seen 05/17/2022 for the evaluation of BP control in the setting of aortic ulcerations at the request of Dr Carlis Abbott.  Assessment & Plan    1  HTN   Patient with long hx of resistent HTN    Had medicine intoelrances and side effects     Was on ARB as outpt    Held due to bump in renal function - echo on 7/5 showed normal systolic function.   Bradycardia  Remains on amlodipine 10 mg , chlorthalidone 25 daily (new in hospital), hydralazine 100 TID.  Clonidine dose was decreased 7/8 from 0.3 tid to 0.1 tid (this is new med for her) Carvedilol stopped d/t bradycardia Continue to monitor asymptomatic brady Says she sleeps a lot all day, ?if this is related to narcotics - however, she is on a low dose.   2  PAD   Pt with ulcerations in descending aorta  - vascular surgery following  3   CVA   s/p  acute/subacutes infarcts    Neuro has seen   OK with current BPs  4  CAD  Pt denies CP   5  HLD  Keep on statin and Zetia  - LDL 101, would consider PCSK9i as an outpatient.  6  REnal  Creatinine stable around 2.0  She keeps asking about d/c plans- I don't think  she needs to be hospitalized for bradycardia or hypertension at this point.  [facility] will sign off.   Medication Recommendations:  continue current meds Consider PCSK9i as an outpatient Follow up as an outpatient:  Dr. Claiborne Billings or APP at [facility], MD, [facility] [place], [place] Director of the [facility]  [facility] Diplomate of the American Board of Clinical Lipidology Attending Cardiologist  Direct Dial: [phone]  Fax: [phone]  Website:  [email or web address]  Pixie Casino, MD  05/20/2022, 1:17 PM

## 2022-05-20 NOTE — Progress Notes (Signed)
PROGRESS NOTE    Robin Arellano  MIW:[id number] DOB: Apr 09, 1951 DOA: 05/14/2022 PCP: Sandi Mariscal, MD     Brief Narrative:   H/o  fibromyalgia, chronic back pain, s/p recent L4-L5 posterior lumbar fusion onn 6/15 by Dr Ellene Route, h/o  CAD, HTN, HLD with statin intolerance,  CKDIII and former smoker who presents 7/3 after sustaining a fall in which her left leg gave out causing her to fall backwards.  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 MRI brain confirmed acute/subacute left PCA infarct She is started on esmolol drip and Admitted to icu due to hypertension emergency Seen by thoracic surgery, vascular surgery, neurology, cardiology Improving transfer to hospitalist service on 7/7  Subjective:  Cr slowly uptrend, urine output does not appear correctly documented, poor appetite,   Bradycardia in the 25-00B, systolic blood pressure mostly less than 120 She is pleasant but slightly confused,  can be reoriented,  Reports feeling better, able to see better, able to more right side better No bm for 9 days Sisters at bedside    Assessment & Plan:  Principal Problem:   Pseudoaneurysm ([place]) Active Problems:   Hypertensive emergency   Hypertension emergency  Initially admitted to ICU on esmolol drip BP improving, goals of sbp <120 Appreciate cardiology input  pseudoaneurysm arising from lateral distal aortic arch without evidence of dissection, seen by thoracic surgery, bp control, no indication for surgery penetrating atherosclerotic ulcer of the descending thoracic aorta.   Seen by vascular surgery "These appear to be asymptomatic at the moment.  Would recommend impulse control with systolic blood pressure less than 120 mmHg, and heart rate less than 80 bpm.  Plan to rescan with CT angiogram of chest, abdomen, and pelvis in 48 hours.  Would only consider intervention  for rapidly enlarging ulcers, or symptomatic lesions."  subacute left PCA branch infarct   with left hemianopia Seen by neurology Etiology is likely embolic from cryptogenic source.  Recommend ongoing stroke evaluation and aggressive risk factor modification.  She may need prolonged cardiac monitoring for paroxysmal A-fib.  Check  30-day heart monitor at discharge Aspirin Plavix for 3 weeks followed by aspirin alone. Vision impairment and right-sided weakness has improved  AKI on CKD IIIb Hemodynamically related? Also received iv contrast initially for pan ct scan Renal ultrasound no obstructive nephropathy Ua unremarkable Awaiting cr improvement before repeating CT per vascular surgery  She has poor oral intake, will try hydration, repeat bmp in am  Anemia of chronic disease Does not appear to have overt bleeding Hgb stable around 9 Monitor Hgb  Oral thrush Nystatin solution ordered   Falls/FTT: Seen by PT recommend CIR once medically stable  Acute versus subacute sacral ala fracture Lumbar fusion, L4-L5 Chronic pain, chronic opiate use - Resume PTA opiates with bowel regimen  Other incidental findings on total body CT scan: RIGHT sacral insufficiency fracture Thickening of the ascending and transverse colon may reflect findings of colitis   Increasing biliary duct distension now with moderate intrahepatic biliary duct distension in similar common bile duct distension without clear cause. Correlate with any symptoms of biliary obstruction or RIGHT upper quadrant pain with further imaging on follow-up as warranted.  Recent postoperative changes related to lumbar spinal fusion. Small nonspecific fluid in the subcutaneous fat. This potentially represents seroma should be correlated with any worsening symptoms in this location or signs infection.  I have Reviewed nursing  notes, Vitals, pain scores, I/o's, Lab results and  imaging results since pt's last encounter, details  please see discussion above  I ordered the following labs:  Unresulted Labs (From admission, onward)     Start     Ordered   05/20/22 2836  Basic metabolic panel  Daily,   R     Question:  Specimen collection method  Answer:  Lab=Lab collect   05/19/22 1826   05/18/22 0806  Urinalysis, Routine w reflex microscopic Urine, Clean Catch  Once,   R        05/18/22 0805             DVT prophylaxis: heparin injection 5,000 Units Start: 05/15/22 2200 SCDs Start: 05/14/22 1751   Code Status:   Code Status: Full Code  Family Communication: sister at bedside  Disposition:   Dispo: The patient is from: home              Anticipated d/c is to: CIR              Anticipated d/c date is: need renal function to improve, need CTA, needs cards and vascular surgery clearance   Antimicrobials:    Anti-infectives (From admission, onward)    None          Objective: Vitals:   05/20/22 0355 05/20/22 0900 05/20/22 1156 05/20/22 2055  BP: (!) 141/53 (!) 122/46 (!) 121/46 (!) 138/53  Pulse: (!) 56 (!) 46 (!) 46 (!) 56  Resp: '13 15 16 15  '$ Temp: 98.1 F (36.7 C) 98.1 F (36.7 C) 97.9 F (36.6 C) 97.9 F (36.6 C)  TempSrc: Oral Oral Oral Oral  SpO2: 96% 95% 96% 96%  Weight:      Height:        Intake/Output Summary (Last 24 hours) at 05/20/2022 2331 Last data filed at 05/20/2022 1753 Gross per 24 hour  Intake --  Output 350 ml  Net -350 ml   Filed Weights   05/15/22 0357 05/17/22 0645 05/18/22 0500  Weight: 70.1 kg 72.4 kg 70 kg    Examination:  General exam: alert, pleasant, but appear slightly confused Respiratory system: Clear to auscultation. Respiratory effort normal. Cardiovascular system:  RRR.  Gastrointestinal system: Abdomen is nondistended, soft and nontender.  Normal bowel sounds heard. Central nervous system: Slightly confused, but able to be reoriented, + impaired visual field, right side weakness, both are improving Extremities:  no edema Skin: No rashes,  lesions or ulcers Psychiatry: Calm and cooperative    Data Reviewed: I have personally reviewed  labs and visualized  imaging studies since the last encounter and formulate the plan        Scheduled Meds:  sodium chloride   Intravenous Once   amLODipine  10 mg Oral Daily   aspirin  81 mg Oral Daily   atorvastatin  40 mg Oral Daily   bisacodyl  10 mg Rectal Daily   chlorthalidone  25 mg Oral Daily   [START ON 05/21/2022] cloNIDine  0.1 mg Oral QHS   clopidogrel  75 mg Oral Daily   docusate sodium  100 mg Oral Daily   ezetimibe  10 mg Oral Daily   heparin injection (subcutaneous)  5,000 Units Subcutaneous Q8H   hydrALAZINE  100 mg Oral TID   lidocaine  1 patch Transdermal Q24H   nystatin  5 mL Oral QID   polyethylene glycol  17 g Oral Daily   senna  1 tablet Oral QHS   topiramate  25 mg Oral BID   Continuous Infusions:  lactated ringers 75 mL/hr at 05/20/22 1846      LOS: 6 days      Florencia Reasons, MD PhD FACP [facility]  Available via Epic secure chat 7am-7pm for nonurgent issues Please page for urgent issues To page the attending provider between 7A-7P or the covering provider during after hours 7P-7A, please log into the web site [email or web address] and access using universal [place] password for that web site. If you do not have the password, please call the hospital operator.    05/20/2022, 11:31 PM

## 2022-05-20 NOTE — Progress Notes (Addendum)
    CR elevated no 2.0.  Once Cr is stable we  plan to rescan with CT angiogram of chest, abdomen, and pelvis.  We would only consider intervention for rapidly enlarging ulcers, or symptomatic lesions per Dr. Roselie Awkward instructions.  She denies SOB or CP and no abdominal or lumbar pain above her post surgical lumbar pain.  She is s/p lumbar 4-5 interbody fusion.     Feet warm and well perfused.   General no acute distress  Roxy Horseman PA-C  VASCULAR STAFF ADDENDUM: I have independently interviewed and examined the patient. I agree with the above.  No chest pain. Cr remains elevated.  Pt concerned about home status, CIR coordinator evaled Friday.   CTA when Cr improves. This can be done from inpt rehab if the pt is accepted.   Cassandria Santee, MD [facility] Phone Number: [phone] 05/20/2022 10:47 AM

## 2022-05-21 ENCOUNTER — Other Ambulatory Visit: Payer: Self-pay | Admitting: Cardiology

## 2022-05-21 DIAGNOSIS — I4891 Unspecified atrial fibrillation: Secondary | ICD-10-CM

## 2022-05-21 DIAGNOSIS — N179 Acute kidney failure, unspecified: Secondary | ICD-10-CM | POA: Diagnosis not present

## 2022-05-21 DIAGNOSIS — I639 Cerebral infarction, unspecified: Secondary | ICD-10-CM

## 2022-05-21 DIAGNOSIS — I161 Hypertensive emergency: Secondary | ICD-10-CM | POA: Diagnosis not present

## 2022-05-21 DIAGNOSIS — D649 Anemia, unspecified: Secondary | ICD-10-CM | POA: Diagnosis not present

## 2022-05-21 LAB — BASIC METABOLIC PANEL
Anion gap: 11 (ref 5–15)
BUN: 27 mg/dL — ABNORMAL HIGH (ref 8–23)
CO2: 20 mmol/L — ABNORMAL LOW (ref 22–32)
Calcium: 9 mg/dL (ref 8.9–10.3)
Chloride: 102 mmol/L (ref 98–111)
Creatinine, Ser: 2.06 mg/dL — ABNORMAL HIGH (ref 0.44–1.00)
GFR, Estimated: 25 mL/min — ABNORMAL LOW (ref >60)
Glucose, Bld: 103 mg/dL — ABNORMAL HIGH (ref 70–99)
Potassium: 3.8 mmol/L (ref 3.5–5.1)
Sodium: 133 mmol/L — ABNORMAL LOW (ref 135–145)

## 2022-05-21 LAB — URINE CULTURE: Culture: 50000 — AB

## 2022-05-21 MED ORDER — CLONIDINE HCL 0.1 MG PO TABS
0.1000 mg | ORAL_TABLET | Freq: Every day | ORAL | 2 refills | Status: DC
  Filled 2022-05-22: qty 30, 30d supply, fill #0

## 2022-05-21 MED ORDER — ATORVASTATIN CALCIUM 40 MG PO TABS
40.0000 mg | ORAL_TABLET | Freq: Every day | ORAL | 2 refills | Status: DC
  Filled 2022-05-22: qty 30, 30d supply, fill #0

## 2022-05-21 MED ORDER — OXYCODONE-ACETAMINOPHEN 5-325 MG PO TABS: 1.0000 | ORAL_TABLET | Freq: Four times a day (QID) | ORAL | 0 refills | Status: DC | PRN

## 2022-05-21 MED ORDER — NYSTATIN 100000 UNIT/ML MT SUSP
5.0000 mL | Freq: Four times a day (QID) | OROMUCOSAL | 0 refills | Status: DC
  Filled 2022-05-22: qty 60, 3d supply, fill #0

## 2022-05-21 MED ORDER — CLOPIDOGREL BISULFATE 75 MG PO TABS
75.0000 mg | ORAL_TABLET | Freq: Every day | ORAL | 0 refills | Status: DC
Start: 2022-05-22 — End: 2022-06-15
  Filled 2022-05-22: qty 16, 16d supply, fill #0

## 2022-05-21 MED ORDER — TOPIRAMATE 25 MG PO TABS
25.0000 mg | ORAL_TABLET | Freq: Two times a day (BID) | ORAL | 1 refills | Status: DC
  Filled 2022-05-22: qty 60, 30d supply, fill #0

## 2022-05-21 MED ORDER — SODIUM CHLORIDE 0.45 % IV SOLN
INTRAVENOUS | Status: DC
Start: 2022-05-21 — End: 2022-05-22

## 2022-05-21 MED ORDER — POLYETHYLENE GLYCOL 3350 17 GM/SCOOP PO POWD
17.0000 g | Freq: Every day | ORAL | 0 refills | Status: DC | PRN
  Filled 2022-05-22: qty 238, 14d supply, fill #0

## 2022-05-21 NOTE — Progress Notes (Signed)
   05/21/22 0319  Vitals  Temp 97.7 F (36.5 C)  Temp Source Oral  BP (!) 148/58 (Took the blood pressure again and corrected it)  MAP (mmHg) 84 (Took BP again and corrected it)  BP Location Left Arm  BP Method Automatic  Patient Position (if appropriate) Lying  Pulse Rate (!) 53  Pulse Rate Source Monitor  ECG Heart Rate (!) 53  Resp 20   10 mg hydralazine given per md order

## 2022-05-21 NOTE — Progress Notes (Addendum)
   Cr increase 2.06 this am Pending improvement for repeat CTA She continues to deny CP, Abdominal or chest pain   Roxy Horseman PA-C  VASCULAR STAFF ADDENDUM: I have independently interviewed and examined the patient. I agree with the above.  Continues to be asymptomatic from thoracic PAU standpoint. Scr prohibitive for scanning. Plan follow up CT angiogram as outpatient. Please call for questions.  [name]. Stanford Breed, MD [facility] Phone Number: [phone] 05/21/2022 11:11 AM

## 2022-05-21 NOTE — Progress Notes (Signed)
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 She demonstrated no further concerns for dysphagia. Demonstrated thorough mastication and good oral attention, the appearance of a brisk swallow response, and no s/s of aspiration. Recommend continuing regular diet/thin liquids; no f/u for swallowing is needed. Cognition is improved per her sister. She was oriented to place/floor, needed cues to look to calendar for date; was oriented to time of day.  Noted with improved sustained and selective attention during functional tasks.    Recommend ongoing SLP f/u for cognition; no f/u for swallowing.   HPI HPI: Patient is a 71 y.o. female with PMH: HTN, CAD, HLD, CKD, former smoker, depression, fibromyalgia, chronic back pain, recent L4-5 posterior lumbar fusion who presented to the hospital on 05/14/22 after falling backwards on her porch while using her walker. CT head revealed suspicious acute left infarct and MRI confirmed acute/subacute left PCA infarct; ST f/u for diet tolerance/ongoing cognitive assessment.      SLP Plan  Continue with current plan of care      Recommendations for follow up therapy are one component of a multi-disciplinary discharge planning process, led by the attending physician.  Recommendations may be updated based on patient status, additional functional criteria and insurance authorization.    Recommendations  Diet recommendations: Regular;Thin liquid Medication Administration: Whole meds with liquid Supervision: Patient able to self feed Compensations: Minimize environmental distractions                Oral Care Recommendations: Patient independent  with oral care Assistance recommended at discharge: Frequent or constant Supervision/Assistance SLP Visit Diagnosis: Dysphagia, unspecified (R13.10) Plan: Continue with current plan of care         Robin Arellano L. Tivis Ringer, MA CCC/SLP Clinical Specialist - Acute Care SLP Acute Rehabilitation Services Office number [phone]   Robin Arellano  05/21/2022, 3:36 PM

## 2022-05-21 NOTE — Progress Notes (Signed)
Physical Therapy Treatment Patient Details Name: Robin Arellano MRN: [id number] DOB: 07/20/1951 Today's Date: 05/21/2022   History of Present Illness Pt is a 71 y.o. female admitted 05/14/22 after falling backwards on her porch while using her cane. 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 MRI revealed acute/subacute left occipital infarct.  PMH: HTN, CAD, HLD, CKD, former smoker, depression, fibromyalgia, chronic back pain, recent L4-5 posterior lumbar fusion (04/26/22)    PT Comments    Pt continues to require assist with all mobility and continues to demonstrate unsafe mobility frequently running into objects on her rt side. Continue to recommend acute inpatient rehab (AIR) for post-acute therapy needs.    Recommendations for follow up therapy are one component of a multi-disciplinary discharge planning process, led by the attending physician.  Recommendations may be updated based on patient status, additional functional criteria and insurance authorization.  Follow Up Recommendations  Acute inpatient rehab (3hours/day)     Assistance Recommended at Discharge Frequent or constant Supervision/Assistance  Patient can return home with the following A little help with walking and/or transfers;Assistance with cooking/housework;Direct supervision/assist for medications management;Direct supervision/assist for financial management;Assist for transportation;Help with stairs or ramp for entrance;A lot of help with bathing/dressing/bathroom   Equipment Recommendations  None recommended by PT    Recommendations for Other Services       Precautions / Restrictions Precautions Precautions: Fall;Back Precaution Booklet Issued: Yes (comment) Precaution Comments: Pt needs verbal cues to follow precautions with mobility Required Braces or Orthoses: Spinal Brace Spinal Brace: Lumbar corset;Applied in sitting position Restrictions Weight Bearing Restrictions: No      Mobility  Bed Mobility Overal bed mobility: Needs Assistance Bed Mobility: Sidelying to Sit, Sit to Supine Rolling: Min guard Sidelying to sit: Min assist   Sit to supine: Min guard   General bed mobility comments: Verbal cues to perform rolling and sidelying to sit for back precautions. Assist to elevate trunk into sitting. Returning to supine pt does not follow back precautions and is in supine before I was able to cue her to go to sidelying    Transfers Overall transfer level: Needs assistance Equipment used: Rolling walker (2 wheels), None Transfers: Sit to/from Stand, Bed to chair/wheelchair/BSC Sit to Stand: Min assist   Step pivot transfers: Min assist       General transfer comment: Assist to power up and for balance. Bed to bsc to bed with assist for balance.    Ambulation/Gait Ambulation/Gait assistance: Min assist Gait Distance (Feet): 140 Feet Assistive device: Rolling walker (2 wheels) Gait Pattern/deviations: Step-to pattern, Decreased step length - right, Decreased step length - left, Shuffle, Trunk flexed, Narrow base of support Gait velocity: decr Gait velocity interpretation: <1.8 ft/sec, indicate of risk for recurrent falls   General Gait Details: Assist for balance and for inattention to rt. Pt ran walker into objects x 5 on rt. Verbal cues to incr step length with pt responding for 2-3 steps and then back to very short step length. Worked on taking 2-3 steps forward and backward without walker and with hand held to work on increasing step length.   Stairs             Wheelchair Mobility    Modified Rankin (Stroke Patients Only)       Balance Overall balance assessment: Needs assistance Sitting-balance support: No upper extremity supported, Feet supported Sitting balance-Leahy Scale: Fair     Standing balance support: Single extremity supported Standing balance-Leahy Scale: Poor Standing balance  comment: UE support or min assist for  static standing.                            Cognition Arousal/Alertness: Awake/alert Behavior During Therapy: WFL for tasks assessed/performed Overall Cognitive Status: Impaired/Different from baseline Area of Impairment: Awareness, Problem solving, Safety/judgement, Attention, Memory                   Current Attention Level: Sustained, Selective Memory: Decreased short-term memory   Safety/Judgement: Decreased awareness of deficits, Decreased awareness of safety Awareness: Emergent Problem Solving: Requires verbal cues, Requires tactile cues          Exercises      General Comments        Pertinent Vitals/Pain Pain Assessment Pain Assessment: Faces Faces Pain Scale: Hurts a little bit Pain Location: back Pain Descriptors / Indicators: Guarding Pain Intervention(s): Limited activity within patient's tolerance    Home Living                          Prior Function            PT Goals (current goals can now be found in the care plan section) Progress towards PT goals: Progressing toward goals    Frequency    Min 4X/week      PT Plan Current plan remains appropriate    Co-evaluation              AM-PAC PT "6 Clicks" Mobility   Outcome Measure  Help needed turning from your back to your side while in a flat bed without using bedrails?: A Little Help needed moving from lying on your back to sitting on the side of a flat bed without using bedrails?: A Little Help needed moving to and from a bed to a chair (including a wheelchair)?: A Little Help needed standing up from a chair using your arms (e.g., wheelchair or bedside chair)?: A Little Help needed to walk in hospital room?: A Little Help needed climbing 3-5 steps with a railing? : Total 6 Click Score: 16    End of Session Equipment Utilized During Treatment: Back brace Activity Tolerance: Patient tolerated treatment well Patient left: in chair;with call bell/phone  within reach;with bed alarm set;with family/visitor present   PT Visit Diagnosis: Unsteadiness on feet (R26.81);Muscle weakness (generalized) (M62.81);History of falling (Z91.81);Repeated falls (R29.6);Difficulty in walking, not elsewhere classified (R26.2);Dizziness and giddiness (R42)     Time: 2505-3976 PT Time Calculation (min) (ACUTE ONLY): 33 min  Charges:  $Gait Training: 8-22 mins $Therapeutic Activity: 8-22 mins                     [place] [facility] [place] 05/21/2022, 4:24 PM

## 2022-05-21 NOTE — Progress Notes (Signed)
Inpatient Rehab Admissions Coordinator:    I spoke with Pt. Regarding insurance denial. I will resubmit case.  Of note, pt.'s husband states he can't care for pt. At home at her current level though pt.'s desire is to go home with [facility].  Clemens Catholic, [place], [place] Admissions Coordinator  [phone] ([place]) [phone] (office)

## 2022-05-21 NOTE — Progress Notes (Addendum)
[facility] PROGRESS NOTE   Kioni Tonnya Garbett YTK:[id number] DOB: 05-Oct-1951 DOA: 05/14/2022  PCP: Sandi Mariscal, MD  Brief History/Interval Summary: H/o fibromyalgia, chronic back pain, s/p recent L4-L5 posterior lumbar fusion onn 6/15 by Dr Ellene Route, h/o  CAD, HTN, HLD with statin intolerance,  CKDIII and former smoker who presents 7/3 after sustaining a fall in which her left leg gave out causing her to fall backwards.  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 MRI brain confirmed acute/subacute left PCA infarct She is started on esmolol drip and Admitted to icu due to hypertension emergency Seen by thoracic surgery, vascular surgery, neurology, cardiology Improving transfer to hospitalist service on 7/7   Consultants: Seen by critical care medicine.  Vascular surgery.  Cardiology.  Procedures: None yet    Subjective/Interval History: Patient mentioned that she is feeling better compared to how she was a few days ago.  Denies any chest pain shortness of breath.  No nausea vomiting.    Assessment/Plan:  Hypertensive emergency  Initially admitted to ICU on esmolol drip.  Blood pressure has improved.  Patient was seen by cardiology.  Currently noted to be on amlodipine, clonidine, hydralazine.  Clonidine dose was decreased due to bradycardia.  Cardiology has signed off as of yesterday.   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   Seen by vascular surgery. Patient appears to be asymptomatic.  Plan is to repeat CT angiogram chest abdomen pelvis.  However due to elevated creatinine this has not been done yet.  Patient remains stable.  We will increase the rate of her IV fluids and recheck  renal function tomorrow.  If she is indeed going to CIR this can be pursued there as well.    Subacute left PCA branch infarct with left hemianopia Seen by neurology Etiology is likely embolic from cryptogenic source.  Recommend ongoing stroke evaluation and aggressive risk factor modification.  She may need prolonged cardiac monitoring for paroxysmal A-fib.  Will need 30-day heart monitor at discharge from inpatient rehabilitation. Aspirin Plavix for 3 weeks followed by aspirin alone. Vision impairment and right-sided weakness has improved   AKI on CKD IIIb Baseline creatinine seems to be around 1.4-1.6.  Creatinine has been increasing here in the hospital.  Likely multifactorial including hemodynamically mediated along with the fact that she received IV contrast during earlier part of this admission.  We will increase the rate of her IV fluids today.  Recheck labs tomorrow.  Once renal function is back to baseline she will need a CT angiogram of chest abdomen pelvis as discussed above.   Renal ultrasound no obstructive nephropathy UA unremarkable.  For some reason urine culture was also sent which is growing 50,000 colonies of E. coli.  UA was unremarkable.  This could be just colonization.  Will not be treated. Monitor urine output.   Anemia of chronic disease No overt bleeding.  Hemoglobin has been stable.  We will recheck CBC tomorrow.   Oral thrush Nystatin solution   Falls/FTT: Seen by PT recommend CIR once medically stable   Back pain with recent back surgery Underwent L4-L5 laminectomy and decompression of L4 and L5 nerve roots.  This was done on Taler 15.  Done by Dr. Ellene Route.  Seems to be stable.  Occasional back pain is present.    Right sacral insufficiency fracture Incidentally noted on CT scan.  Defer to neurosurgery in the outpatient setting.  Biliary ductal dilatation Noted incidentally on CT scan.  Alkaline phosphatase AST ALT and bilirubin levels were normal on July 8.   Outpatient follow-up.   DVT Prophylaxis: Subcutaneous heparin Code Status: Full code Family Communication: Discussed with patient.  No family at bedside Disposition Plan: Plan is for inpatient rehab when bed is available  Status is: Inpatient Remains inpatient appropriate because: Hypertensive emergency, acute kidney injury      Medications: Scheduled:  sodium chloride   Intravenous Once   amLODipine  10 mg Oral Daily   aspirin  81 mg Oral Daily   atorvastatin  40 mg Oral Daily   bisacodyl  10 mg Rectal Daily   chlorthalidone  25 mg Oral Daily   cloNIDine  0.1 mg Oral QHS   clopidogrel  75 mg Oral Daily   docusate sodium  100 mg Oral Daily   ezetimibe  10 mg Oral Daily   heparin injection (subcutaneous)  5,000 Units Subcutaneous Q8H   hydrALAZINE  100 mg Oral TID   lidocaine  1 patch Transdermal Q24H   nystatin  5 mL Oral QID   polyethylene glycol  17 g Oral Daily   senna  1 tablet Oral QHS   topiramate  25 mg Oral BID   Continuous:  sodium chloride 100 mL/hr at 05/21/22 0750   UMP:[id number], butalbital-acetaminophen-caffeine, hydrALAZINE, lip balm, methocarbamol, ondansetron (ZOFRAN) IV, mouth rinse, oxyCODONE-acetaminophen, sodium phosphate  Antibiotics: Anti-infectives (From admission, onward)    None       Objective:  Vital Signs  Vitals:   05/21/22 0134 05/21/22 0319 05/21/22 0405 05/21/22 0756  BP:  (!) 148/58 (!) 151/56 (!) 156/51  Pulse:  (!) 53  (!) 59  Resp:  '20 16 20  '$ Temp:  97.7 F (36.5 C)  98.4 F (36.9 C)  TempSrc:  Oral  Oral  SpO2:  98%  99%  Weight: 69.3 kg     Height:        Intake/Output Summary (Last 24 hours) at 05/21/2022 0958 Last data filed at 05/21/2022 0600 Gross per 24 hour  Intake 831.1 ml  Output 650 ml  Net 181.1 ml   Filed Weights   05/18/22 0500 05/21/22 0039 05/21/22 0134  Weight: 70 kg 69.3 kg 69.3 kg    General appearance: Awake alert.  In no distress Resp: Normal effort at rest.  Diminished air entry at  the bases.  No wheezing or rhonchi. Cardio: S1-S2 is normal regular.  No S3-S4.  No rubs murmurs or bruit GI: Abdomen is soft.  Nontender nondistended.  Bowel sounds are present normal.  No masses organomegaly Extremities: No edema.  able to move all of her extremities Neurologic:  No focal neurological deficits.    Lab Results:  Data Reviewed: I have personally reviewed following labs and reports of the imaging studies  CBC: Recent Labs  Lab 05/14/22 1236 05/14/22 1250 05/15/22 0045 05/16/22 0224 05/17/22 0138 05/19/22 0050  WBC 11.9*  --  9.2 8.6 6.5 6.9  NEUTROABS  --   --   --   --   --  3.9  HGB 9.4* 9.2* 8.7* 9.3* 8.6* 9.0*  HCT 28.9* 27.0* 26.9* 28.2* 26.0* 27.4*  MCV 100.7*  --  99.6 97.6 97.7 98.9  PLT 411*  --  375 376 350 008    Basic Metabolic Panel: Recent Labs  Lab 05/16/22 0224 05/17/22 0138 05/19/22 0050 05/20/22 0053 05/21/22 0036  NA 135 133* 136 133* 133*  K 5.0 4.4 3.9 4.0 3.8  CL 105 102 108 102 102  CO2 18* 19* 21* 20* 20*  GLUCOSE 95 110* 97 95 103*  BUN 24* 27* 25* 28* 27*  CREATININE 1.61* 1.98* 1.95* 2.00* 2.06*  CALCIUM 8.7* 8.5* 8.7* 8.7* 9.0  MG  --  2.3  --   --   --     GFR: Estimated Creatinine Clearance: 23.4 mL/min (A) (by C-G formula based on SCr of 2.06 mg/dL (H)).  Liver Function Tests: Recent Labs  Lab 05/14/22 1236 05/19/22 0050  AST 26 25  ALT 17 15  ALKPHOS 124 106  BILITOT 0.9 0.5  PROT 6.2* 6.2*  ALBUMIN 3.1* 3.0*     CBG: Recent Labs  Lab 05/19/22 0014 05/19/22 0438 05/19/22 0808 05/19/22 1141 05/19/22 2052  GLUCAP 107* 99 119* 97 102*     Recent Results (from the past 240 hour(s))  MRSA Next Gen by PCR, Nasal     Status: None   Collection Time: 05/14/22 10:00 PM   Specimen: Nasal Mucosa; Nasal Swab  Result Value Ref Range Status   MRSA by PCR Next Gen NOT DETECTED NOT DETECTED Final    Comment: (NOTE) The GeneXpert MRSA Assay (FDA approved for NASAL specimens only), is one component of a  comprehensive MRSA colonization surveillance program. It is not intended to diagnose MRSA infection nor to guide or monitor treatment for MRSA infections. Test performance is not FDA approved in patients less than 82 years old. Performed at [facility], [place] [address] [place], [place] [address]   Culture, blood (Routine X 2) w Reflex to ID Panel     Status: None   Collection Time: 05/15/22  9:15 AM   Specimen: BLOOD LEFT HAND  Result Value Ref Range Status   Specimen Description BLOOD LEFT HAND  Final   Special Requests   Final    BOTTLES DRAWN AEROBIC AND ANAEROBIC Blood Culture adequate volume   Culture   Final    NO GROWTH 5 DAYS Performed at [facility], [place] [address], [place],  [address]    Report Status 05/20/2022 FINAL  Final  Culture, blood (Routine X 2) w Reflex to ID Panel     Status: None   Collection Time: 05/15/22  9:23 AM   Specimen: BLOOD  Result Value Ref Range Status   Specimen Description BLOOD RIGHT ANTECUBITAL  Final   Special Requests   Final    BOTTLES DRAWN AEROBIC AND ANAEROBIC Blood Culture adequate volume   Culture   Final    NO GROWTH 5 DAYS Performed at [facility], [place] [address], [place],  [address]    Report Status 05/20/2022 FINAL  Final  Urine Culture     Status: Abnormal   Collection Time: 05/19/22  6:27 PM   Specimen: Urine, Clean Catch  Result Value Ref Range Status   Specimen Description URINE, CLEAN CATCH  Final   Special Requests   Final    NONE Performed at [facility], [place] [address] [place], [place] [address]    Culture 50,000 COLONIES/mL ESCHERICHIA COLI (A)  Final   Report Status 05/21/2022 FINAL  Final   Organism ID, Bacteria ESCHERICHIA COLI (A)  Final      Susceptibility   Escherichia coli - MIC*    AMPICILLIN <=2 SENSITIVE Sensitive  CEFAZOLIN <=4 SENSITIVE Sensitive     CEFEPIME <=0.12 SENSITIVE Sensitive     CEFTRIAXONE <=0.25 SENSITIVE Sensitive     CIPROFLOXACIN <=0.25  SENSITIVE Sensitive     GENTAMICIN <=1 SENSITIVE Sensitive     IMIPENEM <=0.25 SENSITIVE Sensitive     NITROFURANTOIN <=16 SENSITIVE Sensitive     TRIMETH/SULFA <=20 SENSITIVE Sensitive     AMPICILLIN/SULBACTAM <=2 SENSITIVE Sensitive     PIP/TAZO <=4 SENSITIVE Sensitive     * 50,000 COLONIES/mL ESCHERICHIA COLI      Radiology Studies: No results found.     LOS: 7 days   Brie Eppard [employer] on [email or web address]  05/21/2022, 9:58 AM

## 2022-05-21 NOTE — Progress Notes (Signed)
Occupational Therapy Treatment Patient Details Name: Robin Arellano MRN: [id number] DOB: 1951-01-25 Today's Date: 05/21/2022   History of present illness Pt is a 71 y.o. female admitted 05/14/22 after falling backwards on her porch while using her cane. 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 MRI revealed acute/subacute left occipital infarct.  PMH: HTN, CAD, HLD, CKD, former smoker, depression, fibromyalgia, chronic back pain, recent L4-5 posterior lumbar fusion (04/26/22)   OT comments  Patient received in supine and agreeable to OT treatment. Patient asked to use BSC and required cues for log rolling technique and safety. Patient was mod assist to donn back brace and min assist to transfer to [facility]. Patient able to perform toilet hygiene seated. Patient ambulated to recliner with RW and cues for safety and attention to right. Addressed right visual scanning with patient unable to locate items on right without turning head. Acute OT to continue to follow.    Recommendations for follow up therapy are one component of a multi-disciplinary discharge planning process, led by the attending physician.  Recommendations may be updated based on patient status, additional functional criteria and insurance authorization.    Follow Up Recommendations  Acute inpatient rehab (3hours/day)    Assistance Recommended at Discharge Frequent or constant Supervision/Assistance  Patient can return home with the following  A little help with walking and/or transfers;A lot of help with bathing/dressing/bathroom;Assistance with cooking/housework;Direct supervision/assist for medications management;Direct supervision/assist for financial management;Assist for transportation;Help with stairs or ramp for entrance   Equipment Recommendations  None recommended by OT    Recommendations for Other Services      Precautions / Restrictions Precautions Precautions: Fall;Back Precaution Booklet  Issued: Yes (comment) Precaution Comments: reviewed 3/3 back precautions and brace wear Required Braces or Orthoses: Spinal Brace Spinal Brace: Lumbar corset;Applied in sitting position Restrictions Weight Bearing Restrictions: No       Mobility Bed Mobility Overal bed mobility: Needs Assistance Bed Mobility: Sidelying to Sit Rolling: Min guard Sidelying to sit: Min assist       General bed mobility comments: verbal cues for log rolling and back precautions    Transfers Overall transfer level: Needs assistance Equipment used: Rolling walker (2 wheels) Transfers: Sit to/from Stand Sit to Stand: Min assist           General transfer comment: min assist to stand and to guide walker     Balance Overall balance assessment: Needs assistance Sitting-balance support: No upper extremity supported, Feet supported Sitting balance-Leahy Scale: Fair Sitting balance - Comments: static sitting without support   Standing balance support: Reliant on assistive device for balance Standing balance-Leahy Scale: Poor Standing balance comment: reliant on BUE support when standing                           ADL either performed or assessed with clinical judgement   ADL Overall ADL's : Needs assistance/impaired     Grooming: Wash/dry hands;Wash/dry face;Supervision/safety;Sitting Grooming Details (indicate cue type and reason): in recliner             Lower Body Dressing: Moderate assistance;Sitting/lateral leans;Cueing for compensatory techniques Lower Body Dressing Details (indicate cue type and reason): assistance to donn socks with difficutly crossing legs to don Toilet Transfer: Minimal assistance;Rolling walker (2 wheels);Ambulation Toilet Transfer Details (indicate cue type and reason): transfer to [facility] from EOB with RW and min assist Toileting- Clothing Manipulation and Hygiene: Supervision/safety;Sitting/lateral lean Toileting - Clothing Manipulation Details  (indicate  cue type and reason): performed toilet hygiene seated       General ADL Comments: Patient required cues for safety and attending to right    Extremity/Trunk Assessment              Vision       Perception     Praxis      Cognition Arousal/Alertness: Awake/alert Behavior During Therapy: WFL for tasks assessed/performed Overall Cognitive Status: Impaired/Different from baseline Area of Impairment: Awareness, Problem solving, Safety/judgement, Attention, Memory                   Current Attention Level: Sustained, Selective Memory: Decreased short-term memory   Safety/Judgement: Decreased awareness of deficits, Decreased awareness of safety Awareness: Emergent Problem Solving: Requires verbal cues, Requires tactile cues General Comments: aware of visual deficits,turns head to locate items on right        Exercises      Shoulder Instructions       General Comments addressed visual scanning with items placed on right    Pertinent Vitals/ Pain       Pain Assessment Pain Assessment: Faces Faces Pain Scale: Hurts little more Pain Location: back Pain Descriptors / Indicators: Aching, Guarding Pain Intervention(s): Limited activity within patient's tolerance, Monitored during session, Repositioned  Home Living                                          Prior Functioning/Environment              Frequency  Min 2X/week        Progress Toward Goals  OT Goals(current goals can now be found in the care plan section)  Progress towards OT goals: Progressing toward goals  Acute Rehab OT Goals Patient Stated Goal: go home OT Goal Formulation: With patient Time For Goal Achievement: 05/30/22 Potential to Achieve Goals: Good ADL Goals Pt Will Perform Grooming: with supervision;standing Pt Will Perform Lower Body Dressing: with supervision;sit to/from stand Pt Will Transfer to Toilet: with supervision;ambulating;regular  height toilet Pt Will Perform Toileting - Clothing Manipulation and hygiene: with supervision;sit to/from stand Pt Will Perform Tub/Shower Transfer: Shower transfer;with supervision;shower seat;ambulating;rolling walker;grab bars Additional ADL Goal #1: Patient will scan her Right visual field with min VC's for increased independence with ADL completion.  Plan Discharge plan needs to be updated    Co-evaluation                 AM-PAC OT "6 Clicks" Daily Activity     Outcome Measure   Help from another person eating meals?: A Little Help from another person taking care of personal grooming?: A Little Help from another person toileting, which includes using toliet, bedpan, or urinal?: A Little Help from another person bathing (including washing, rinsing, drying)?: A Lot Help from another person to put on and taking off regular upper body clothing?: A Lot Help from another person to put on and taking off regular lower body clothing?: A Lot 6 Click Score: 15    End of Session Equipment Utilized During Treatment: Rolling walker (2 wheels);Gait belt;Back brace  OT Visit Diagnosis: Other abnormalities of gait and mobility (R26.89);Muscle weakness (generalized) (M62.81);Pain;History of falling (Z91.81);Low vision, both eyes (H54.2);Other symptoms and signs involving cognitive function   Activity Tolerance Patient tolerated treatment well   Patient Left in chair;with call bell/phone within reach;with chair alarm set  Nurse Communication Mobility status        Time: 4975-3005 OT Time Calculation (min): 29 min  Charges: OT General Charges $OT Visit: 1 Visit OT Treatments $Self Care/Home Management : 23-37 mins  Lodema Hong, [place]  Office [place] 05/21/2022, 11:15 AM

## 2022-05-21 NOTE — TOC Progression Note (Addendum)
Transition of Care [facility]) - Progression Note    Patient Details  Name: Jessamine Tasheika Kitzmiller MRN: [id number] Date of Birth: December 12, 1950  [facility] CM/SW Contact  Zenon Mayo, RN Phone Number: 05/21/2022, 11:53 AM  Clinical Narrative:    CIR has been denied by insurance, family may want to appeal the denial or take patient home with [facility]. Will await to see what family decides. NCM went to speak with patient and spouse, asked if they wanted to appeal the denial for CIR or go home with [facility].  Husband states she will fall again if she goes home.   This NCM informed him he can appeal the denial ,and also they may be able to look into a SNF if the denial is upheld. He states he wants to speak to her physicians.  NCM informed MD, staff RN and CIR coordnator that he would like to appeal the denial.        Expected Discharge Plan and Services           Expected Discharge Date: 05/21/22                                     Social Determinants of Health (SDOH) Interventions    Readmission Risk Interventions     No data to display

## 2022-05-21 NOTE — Progress Notes (Signed)
Inpatient Rehab Admissions Coordinator:    I received a denial from insurance for CIR. I notified patient. She states she prefers to go home and do home health. Husband and daughter are going to come in and discuss and give a final decision on whether they want to appeal CIR denial or just go home. I will follow up   with them this afternoon.  Clemens Catholic, [place], [place] Admissions Coordinator  [phone] ([place]) [phone] (office)

## 2022-05-22 ENCOUNTER — Inpatient Hospital Stay (HOSPITAL_COMMUNITY)
Admission: RE | Admit: 2022-05-22 | Discharge: 2022-05-26 | DRG: 056 | Disposition: A | Discharge status: Other Acute Inpatient Hospital | Payer: PPO | Source: Intra-hospital | Attending: Physical Medicine and Rehabilitation | Admitting: Physical Medicine and Rehabilitation

## 2022-05-22 ENCOUNTER — Other Ambulatory Visit: Payer: Self-pay

## 2022-05-22 ENCOUNTER — Ambulatory Visit: Payer: PPO | Admitting: Physical Medicine & Rehabilitation

## 2022-05-22 ENCOUNTER — Other Ambulatory Visit (HOSPITAL_COMMUNITY): Payer: Self-pay

## 2022-05-22 DIAGNOSIS — I639 Cerebral infarction, unspecified: Secondary | ICD-10-CM | POA: Diagnosis not present

## 2022-05-22 DIAGNOSIS — F419 Anxiety disorder, unspecified: Secondary | ICD-10-CM | POA: Diagnosis present

## 2022-05-22 DIAGNOSIS — Z8049 Family history of malignant neoplasm of other genital organs: Secondary | ICD-10-CM

## 2022-05-22 DIAGNOSIS — G8918 Other acute postprocedural pain: Secondary | ICD-10-CM | POA: Diagnosis not present

## 2022-05-22 DIAGNOSIS — R109 Unspecified abdominal pain: Secondary | ICD-10-CM | POA: Diagnosis present

## 2022-05-22 DIAGNOSIS — F32A Depression, unspecified: Secondary | ICD-10-CM | POA: Diagnosis present

## 2022-05-22 DIAGNOSIS — D631 Anemia in chronic kidney disease: Secondary | ICD-10-CM | POA: Diagnosis present

## 2022-05-22 DIAGNOSIS — I63532 Cerebral infarction due to unspecified occlusion or stenosis of left posterior cerebral artery: Secondary | ICD-10-CM

## 2022-05-22 DIAGNOSIS — Z6827 Body mass index (BMI) 27.0-27.9, adult: Secondary | ICD-10-CM

## 2022-05-22 DIAGNOSIS — E663 Overweight: Secondary | ICD-10-CM | POA: Diagnosis not present

## 2022-05-22 DIAGNOSIS — K859 Acute pancreatitis without necrosis or infection, unspecified: Secondary | ICD-10-CM | POA: Diagnosis not present

## 2022-05-22 DIAGNOSIS — R509 Fever, unspecified: Secondary | ICD-10-CM | POA: Diagnosis not present

## 2022-05-22 DIAGNOSIS — H53461 Homonymous bilateral field defects, right side: Secondary | ICD-10-CM | POA: Diagnosis present

## 2022-05-22 DIAGNOSIS — Z91040 Latex allergy status: Secondary | ICD-10-CM

## 2022-05-22 DIAGNOSIS — G9341 Metabolic encephalopathy: Secondary | ICD-10-CM | POA: Diagnosis not present

## 2022-05-22 DIAGNOSIS — E785 Hyperlipidemia, unspecified: Secondary | ICD-10-CM | POA: Diagnosis present

## 2022-05-22 DIAGNOSIS — B370 Candidal stomatitis: Secondary | ICD-10-CM | POA: Diagnosis present

## 2022-05-22 DIAGNOSIS — G8929 Other chronic pain: Secondary | ICD-10-CM | POA: Diagnosis present

## 2022-05-22 DIAGNOSIS — I129 Hypertensive chronic kidney disease with stage 1 through stage 4 chronic kidney disease, or unspecified chronic kidney disease: Secondary | ICD-10-CM | POA: Diagnosis present

## 2022-05-22 DIAGNOSIS — I2510 Atherosclerotic heart disease of native coronary artery without angina pectoris: Secondary | ICD-10-CM | POA: Diagnosis present

## 2022-05-22 DIAGNOSIS — Z808 Family history of malignant neoplasm of other organs or systems: Secondary | ICD-10-CM

## 2022-05-22 DIAGNOSIS — G629 Polyneuropathy, unspecified: Secondary | ICD-10-CM | POA: Diagnosis present

## 2022-05-22 DIAGNOSIS — M797 Fibromyalgia: Secondary | ICD-10-CM | POA: Diagnosis present

## 2022-05-22 DIAGNOSIS — Z888 Allergy status to other drugs, medicaments and biological substances status: Secondary | ICD-10-CM

## 2022-05-22 DIAGNOSIS — Z87891 Personal history of nicotine dependence: Secondary | ICD-10-CM | POA: Diagnosis not present

## 2022-05-22 DIAGNOSIS — Z8249 Family history of ischemic heart disease and other diseases of the circulatory system: Secondary | ICD-10-CM | POA: Diagnosis not present

## 2022-05-22 DIAGNOSIS — Z9049 Acquired absence of other specified parts of digestive tract: Secondary | ICD-10-CM | POA: Diagnosis not present

## 2022-05-22 DIAGNOSIS — U071 COVID-19: Secondary | ICD-10-CM | POA: Diagnosis not present

## 2022-05-22 DIAGNOSIS — F22 Delusional disorders: Secondary | ICD-10-CM | POA: Diagnosis not present

## 2022-05-22 DIAGNOSIS — N179 Acute kidney failure, unspecified: Secondary | ICD-10-CM | POA: Diagnosis present

## 2022-05-22 DIAGNOSIS — M1990 Unspecified osteoarthritis, unspecified site: Secondary | ICD-10-CM | POA: Diagnosis present

## 2022-05-22 DIAGNOSIS — Z825 Family history of asthma and other chronic lower respiratory diseases: Secondary | ICD-10-CM

## 2022-05-22 DIAGNOSIS — R7989 Other specified abnormal findings of blood chemistry: Secondary | ICD-10-CM | POA: Diagnosis not present

## 2022-05-22 DIAGNOSIS — Z7982 Long term (current) use of aspirin: Secondary | ICD-10-CM

## 2022-05-22 DIAGNOSIS — N1832 Chronic kidney disease, stage 3b: Secondary | ICD-10-CM | POA: Diagnosis present

## 2022-05-22 DIAGNOSIS — Z90710 Acquired absence of both cervix and uterus: Secondary | ICD-10-CM | POA: Diagnosis not present

## 2022-05-22 DIAGNOSIS — Z981 Arthrodesis status: Secondary | ICD-10-CM | POA: Diagnosis not present

## 2022-05-22 DIAGNOSIS — M5442 Lumbago with sciatica, left side: Secondary | ICD-10-CM | POA: Diagnosis not present

## 2022-05-22 DIAGNOSIS — E871 Hypo-osmolality and hyponatremia: Secondary | ICD-10-CM | POA: Diagnosis present

## 2022-05-22 DIAGNOSIS — Z79899 Other long term (current) drug therapy: Secondary | ICD-10-CM

## 2022-05-22 DIAGNOSIS — I69398 Other sequelae of cerebral infarction: Principal | ICD-10-CM

## 2022-05-22 DIAGNOSIS — Z741 Need for assistance with personal care: Secondary | ICD-10-CM | POA: Diagnosis present

## 2022-05-22 DIAGNOSIS — R112 Nausea with vomiting, unspecified: Secondary | ICD-10-CM | POA: Diagnosis not present

## 2022-05-22 DIAGNOSIS — R001 Bradycardia, unspecified: Secondary | ICD-10-CM | POA: Diagnosis present

## 2022-05-22 DIAGNOSIS — M545 Low back pain, unspecified: Secondary | ICD-10-CM | POA: Diagnosis present

## 2022-05-22 DIAGNOSIS — I10 Essential (primary) hypertension: Secondary | ICD-10-CM | POA: Diagnosis not present

## 2022-05-22 LAB — COMPREHENSIVE METABOLIC PANEL
ALT: 18 U/L (ref 0–44)
AST: 22 U/L (ref 15–41)
Albumin: 3.1 g/dL — ABNORMAL LOW (ref 3.5–5.0)
Alkaline Phosphatase: 106 U/L (ref 38–126)
Anion gap: 6 (ref 5–15)
BUN: 19 mg/dL (ref 8–23)
CO2: 21 mmol/L — ABNORMAL LOW (ref 22–32)
Calcium: 8.8 mg/dL — ABNORMAL LOW (ref 8.9–10.3)
Chloride: 107 mmol/L (ref 98–111)
Creatinine, Ser: 1.72 mg/dL — ABNORMAL HIGH (ref 0.44–1.00)
GFR, Estimated: 31 mL/min — ABNORMAL LOW (ref >60)
Glucose, Bld: 94 mg/dL (ref 70–99)
Potassium: 3.7 mmol/L (ref 3.5–5.1)
Sodium: 134 mmol/L — ABNORMAL LOW (ref 135–145)
Total Bilirubin: 0.4 mg/dL (ref 0.3–1.2)
Total Protein: 6.5 g/dL (ref 6.5–8.1)

## 2022-05-22 LAB — CBC
HCT: 26.6 % — ABNORMAL LOW (ref 36.0–46.0)
Hemoglobin: 8.8 g/dL — ABNORMAL LOW (ref 12.0–15.0)
MCH: 32 pg (ref 26.0–34.0)
MCHC: 33.1 g/dL (ref 30.0–36.0)
MCV: 96.7 fL (ref 80.0–100.0)
Platelets: 271 10*3/uL (ref 150–400)
RBC: 2.75 MIL/uL — ABNORMAL LOW (ref 3.87–5.11)
RDW: 14.3 % (ref 11.5–15.5)
WBC: 8.8 10*3/uL (ref 4.0–10.5)
nRBC: 0 % (ref 0.0–0.2)

## 2022-05-22 MED ORDER — ENSURE ENLIVE PO LIQD: 237.0000 mL | Freq: Two times a day (BID) | ORAL | Status: DC

## 2022-05-22 MED ORDER — HEPARIN SODIUM (PORCINE) 5000 UNIT/ML IJ SOLN: 5000.0000 [IU] | Freq: Three times a day (TID) | INTRAMUSCULAR | Status: DC

## 2022-05-22 MED ORDER — CLONIDINE HCL 0.1 MG PO TABS
0.1000 mg | ORAL_TABLET | Freq: Every day | ORAL | Status: DC
  Administered 2022-05-22 – 2022-05-25 (×4): 0.1 mg via ORAL
  Filled 2022-05-22 (×4): qty 1

## 2022-05-22 MED ORDER — PROCHLORPERAZINE EDISYLATE 10 MG/2ML IJ SOLN: 5.0000 mg | Freq: Four times a day (QID) | INTRAMUSCULAR | Status: DC | PRN

## 2022-05-22 MED ORDER — PROCHLORPERAZINE MALEATE 5 MG PO TABS
5.0000 mg | ORAL_TABLET | Freq: Four times a day (QID) | ORAL | Status: DC | PRN
  Administered 2022-05-26 (×2): 10 mg via ORAL
  Filled 2022-05-22 (×2): qty 2

## 2022-05-22 MED ORDER — ALPRAZOLAM 0.5 MG PO TABS
0.5000 mg | ORAL_TABLET | Freq: Every day | ORAL | Status: DC | PRN
  Administered 2022-05-22 – 2022-05-25 (×4): 0.5 mg via ORAL
  Filled 2022-05-22 (×4): qty 1

## 2022-05-22 MED ORDER — CHLORTHALIDONE 25 MG PO TABS
25.0000 mg | ORAL_TABLET | Freq: Every day | ORAL | Status: DC
  Administered 2022-05-23 – 2022-05-26 (×4): 25 mg via ORAL
  Filled 2022-05-22 (×5): qty 1

## 2022-05-22 MED ORDER — TRAZODONE HCL 50 MG PO TABS
25.0000 mg | ORAL_TABLET | Freq: Every evening | ORAL | Status: DC | PRN
  Administered 2022-05-25: 50 mg via ORAL
  Filled 2022-05-22: qty 1

## 2022-05-22 MED ORDER — BUTALBITAL-APAP-CAFFEINE 50-325-40 MG PO TABS
1.0000 | ORAL_TABLET | Freq: Four times a day (QID) | ORAL | Status: DC | PRN
  Administered 2022-05-23 – 2022-05-25 (×2): 1 via ORAL
  Filled 2022-05-22 (×2): qty 1

## 2022-05-22 MED ORDER — METHOCARBAMOL 500 MG PO TABS
500.0000 mg | ORAL_TABLET | Freq: Four times a day (QID) | ORAL | Status: DC | PRN
  Administered 2022-05-22 – 2022-05-26 (×2): 500 mg via ORAL
  Filled 2022-05-22 (×2): qty 1

## 2022-05-22 MED ORDER — ENOXAPARIN SODIUM 40 MG/0.4ML IJ SOSY
40.0000 mg | PREFILLED_SYRINGE | INTRAMUSCULAR | Status: DC
  Filled 2022-05-22 (×2): qty 0.4

## 2022-05-22 MED ORDER — POLYETHYLENE GLYCOL 3350 17 G PO PACK
17.0000 g | PACK | Freq: Every day | ORAL | Status: DC
  Filled 2022-05-22 (×2): qty 1

## 2022-05-22 MED ORDER — HYDRALAZINE HCL 50 MG PO TABS
100.0000 mg | ORAL_TABLET | Freq: Three times a day (TID) | ORAL | Status: DC
  Administered 2022-05-22 – 2022-05-26 (×10): 100 mg via ORAL
  Filled 2022-05-22 (×12): qty 2

## 2022-05-22 MED ORDER — MAGNESIUM HYDROXIDE 400 MG/5ML PO SUSP: 30.0000 mL | Freq: Every day | ORAL | Status: DC | PRN

## 2022-05-22 MED ORDER — ALUM & MAG HYDROXIDE-SIMETH 200-200-20 MG/5ML PO SUSP
30.0000 mL | ORAL | Status: DC | PRN
  Filled 2022-05-22: qty 30

## 2022-05-22 MED ORDER — OXYCODONE-ACETAMINOPHEN 5-325 MG PO TABS
1.0000 | ORAL_TABLET | Freq: Four times a day (QID) | ORAL | Status: DC | PRN
  Administered 2022-05-22 – 2022-05-24 (×5): 1 via ORAL
  Filled 2022-05-22 (×5): qty 1

## 2022-05-22 MED ORDER — ATORVASTATIN CALCIUM 40 MG PO TABS
40.0000 mg | ORAL_TABLET | Freq: Every day | ORAL | Status: DC
  Administered 2022-05-22 – 2022-05-26 (×5): 40 mg via ORAL
  Filled 2022-05-22 (×5): qty 1

## 2022-05-22 MED ORDER — FLEET ENEMA 7-19 GM/118ML RE ENEM: 1.0000 | ENEMA | Freq: Once | RECTAL | Status: DC | PRN

## 2022-05-22 MED ORDER — ASPIRIN 81 MG PO CHEW
81.0000 mg | CHEWABLE_TABLET | Freq: Every day | ORAL | Status: DC
  Administered 2022-05-23 – 2022-05-26 (×4): 81 mg via ORAL
  Filled 2022-05-22 (×4): qty 1

## 2022-05-22 MED ORDER — GUAIFENESIN-DM 100-10 MG/5ML PO SYRP: 5.0000 mL | ORAL_SOLUTION | Freq: Four times a day (QID) | ORAL | Status: DC | PRN

## 2022-05-22 MED ORDER — AMLODIPINE BESYLATE 10 MG PO TABS
10.0000 mg | ORAL_TABLET | Freq: Every day | ORAL | Status: DC
  Administered 2022-05-23 – 2022-05-26 (×4): 10 mg via ORAL
  Filled 2022-05-22 (×4): qty 1

## 2022-05-22 MED ORDER — ACETAMINOPHEN 325 MG PO TABS
325.0000 mg | ORAL_TABLET | ORAL | Status: DC | PRN
  Administered 2022-05-23 – 2022-05-25 (×4): 650 mg via ORAL
  Filled 2022-05-22: qty 2
  Filled 2022-05-22: qty 1
  Filled 2022-05-22 (×3): qty 2

## 2022-05-22 MED ORDER — PROCHLORPERAZINE 25 MG RE SUPP
12.5000 mg | Freq: Four times a day (QID) | RECTAL | Status: DC | PRN
Start: 2022-05-22 — End: 2022-05-26

## 2022-05-22 MED ORDER — NYSTATIN 100000 UNIT/ML MT SUSP
5.0000 mL | Freq: Four times a day (QID) | OROMUCOSAL | Status: DC
  Administered 2022-05-22 – 2022-05-24 (×7): 500000 [IU] via ORAL
  Filled 2022-05-22 (×7): qty 5

## 2022-05-22 MED ORDER — SENNA 8.6 MG PO TABS
1.0000 | ORAL_TABLET | Freq: Every day | ORAL | Status: DC
  Administered 2022-05-22 – 2022-05-24 (×3): 8.6 mg via ORAL
  Filled 2022-05-22 (×4): qty 1

## 2022-05-22 MED ORDER — BISACODYL 5 MG PO TBEC: 5.0000 mg | DELAYED_RELEASE_TABLET | Freq: Every day | ORAL | Status: DC | PRN

## 2022-05-22 MED ORDER — TOPIRAMATE 25 MG PO TABS
25.0000 mg | ORAL_TABLET | Freq: Two times a day (BID) | ORAL | Status: DC
  Administered 2022-05-22 – 2022-05-26 (×8): 25 mg via ORAL
  Filled 2022-05-22 (×8): qty 1

## 2022-05-22 MED ORDER — CLOPIDOGREL BISULFATE 75 MG PO TABS
75.0000 mg | ORAL_TABLET | Freq: Every day | ORAL | Status: DC
  Administered 2022-05-23 – 2022-05-26 (×4): 75 mg via ORAL
  Filled 2022-05-22 (×4): qty 1

## 2022-05-22 MED ORDER — EZETIMIBE 10 MG PO TABS
10.0000 mg | ORAL_TABLET | Freq: Every day | ORAL | Status: DC
  Administered 2022-05-23 – 2022-05-26 (×4): 10 mg via ORAL
  Filled 2022-05-22 (×4): qty 1

## 2022-05-22 NOTE — Progress Notes (Signed)
PMR Admission Coordinator Pre-Admission Assessment   Patient: Robin Arellano is an 71 y.o., female MRN: [id number] DOB: Mar 21, 1951 Height: $RemoveBefo'5\' 3"'[id number]$  (160 cm) Weight: 70 kg   Insurance Information HMO:     PPO: yes      PCP:      IPA:      80/20:      OTHER:  PRIMARY: [employer]      Policy#: [id number]      Subscriber: Patient CM Name:       Phone#:      Fax#:  Pre-Cert#: [id number]      Employer:  [employer] with [employer] caled and stated Pt. Approved 7-11 for 7 days  Benefits:  Phone #: [phone]     Name:  Robin Arellano Date: 11/12/2021 - still active Deductible: no deductible OOP Max: $3,200 ($515 met) CIR: $295/day co-pay for days 1-6, $0/day days 7-90 SNF:  $0/day co-pay for days 1-20, $184/day co-pay for days 21-100; limited to 100 days/benefit period Outpatient: $15/visit co-pay; limited by medical necessity Home Health:  100% coverage DME: 80% coverage; 20% co-insurance Providers: In network   SECONDARY:       Policy#:      Phone#:    Development worker, community:       Phone#:    The Engineer, petroleum" for patients in Inpatient Rehabilitation Facilities with attached "Privacy Act [place] Records" was provided and verbally reviewed with: Patient   Emergency Contact Information Contact Information       Name Relation Home Work Mobile    [place] Spouse [phone]   [phone]    Rachel Bo     [phone]           Current Medical History  Patient Admitting Diagnosis: aortic aneurism, CVA History of Present Illness: Robin Arellano is a 71 year old female who presented to the emergency department on 05/14/2022 after sustaining a fall at approximately 2 AM.  She had recently undergone back surgery and had taken a Robaxin for left leg pain.  She had stopped aspirin therapy prior to surgery and had not restarted it.  She fell backwards and denied loss of consciousness, headache or dizziness.  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  Cardiothoracic surgery consulted.  Admitted to ICU and started on esmolol for hypertensive emergency.  Vascular surgery was also consulted to evaluate her descending thoracic aortic disease.  She was seen by Dr. Stanford Breed on 7/4 with plans to repeat CT angiogram of chest abdomen and pelvis in 48 hours.  This was delayed due to acute kidney injury.  Arrangements made to perform this as outpatient.  No surgical intervention advised during hospitalization.  CT of the head revealed suspicious acute left infarct and MRI of the brain confirmed an acute/subacute left PCA infarct and neurology was consulted for stroke work-up.  Plavix and aspirin started on 7/5 for 3 weeks then aspirin alone.  She is tolerating heart healthy diet. The patient requires inpatient physical medicine and rehabilitation evaluations and treatment secondary to dysfunction due to all deficits secondary to acute left PCA infarct and recent back surgery. Complete NIHSS TOTAL: 1   Patient's medical record from [facility] has been reviewed by the rehabilitation admission coordinator and physician.   Past Medical History      Past Medical History:  Diagnosis Date   Anxiety      on  meds   Back pain     Chronic female pelvic pain     Coronary artery disease      mild, non-obstructive 11/2018   Depression      on meds   Family history of adverse reaction to anesthesia      sister had difficulty waking up   Fibromyalgia     H/O leukocytosis     Headache     Hyperlipidemia      on meds   Hypertension      on meds   MI (myocardial infarction) (HCC)      Pt states she did not have a MI- EKG was normal, was GERD   Osteoarthritis      on meds   Ovarian cyst, right     PONV (postoperative nausea and vomiting)     Post-operative nausea and vomiting     SVD (spontaneous vaginal delivery)      x 2   Vitamin D  deficiency        Has the patient had major surgery during 100 days prior to admission? No   Family History   family history includes Aneurysm in her father; COPD in her brother; Heart disease in her paternal grandfather; Other in her sister and sister; Skin cancer in her brother and brother; Uterine cancer (age of onset: 83) in her mother.   Current Medications   Current Facility-Administered Medications:    0.9 %  sodium chloride infusion (Manually program via Guardrails IV Fluids), , Intravenous, Once, Chestine Spore, Saahir Prude P, DO   ALPRAZolam Prudy Feeler) tablet 0.5 mg, 0.5 mg, Oral, Daily PRN, Selmer Dominion B, NP, 0.5 mg at 05/17/22 2150   amLODipine (NORVASC) tablet 10 mg, 10 mg, Oral, Daily, Selmer Dominion B, NP, 10 mg at 05/18/22 7039   aspirin chewable tablet 81 mg, 81 mg, Oral, Daily, Gevena Mart A, NP, 81 mg at 05/18/22 0959   atorvastatin (LIPITOR) tablet 40 mg, 40 mg, Oral, Daily, Steffanie Dunn, DO, 40 mg at 05/17/22 1756   butalbital-acetaminophen-caffeine (FIORICET) 50-325-40 MG per tablet 1 tablet, 1 tablet, Oral, Q6H PRN, Steffanie Dunn, DO, 1 tablet at 05/18/22 1002   carvedilol (COREG) tablet 3.125 mg, 3.125 mg, Oral, BID WC, Pricilla Riffle, MD, 3.125 mg at 05/18/22 3384   Chlorhexidine Gluconate Cloth 2 % PADS 6 each, 6 each, Topical, Daily, Steffanie Dunn, DO, 6 each at 05/18/22 0959   chlorthalidone (HYGROTON) tablet 25 mg, 25 mg, Oral, Daily, Dietrich Pates V, MD, 25 mg at 05/18/22 4488   cloNIDine (CATAPRES) tablet 0.1 mg, 0.1 mg, Oral, TID, Barrett, Rhonda G, PA-C, 0.1 mg at 05/18/22 0959   clopidogrel (PLAVIX) tablet 75 mg, 75 mg, Oral, Daily, Shafer, Devon, NP, 75 mg at 05/18/22 0959   docusate sodium (COLACE) capsule 100 mg, 100 mg, Oral, Daily, Karie Fetch P, DO, 100 mg at 05/18/22 0959   esmolol (BREVIBLOC) 2000 mg / 100 mL (20 mg/mL) infusion, 25-300 mcg/kg/min, Intravenous, Continuous, Larinda Buttery, MD, Stopped at 05/17/22 0349   ezetimibe (ZETIA) tablet 10 mg, 10 mg, Oral,  Daily, Selmer Dominion B, NP, 10 mg at 05/18/22 0959   heparin injection 5,000 Units, 5,000 Units, Subcutaneous, Q8H, Steffanie Dunn, DO, 5,000 Units at 05/18/22 1331   hydrALAZINE (APRESOLINE) injection 10 mg, 10 mg, Intravenous, Q4H PRN, Karl Ito, MD, 10 mg at 05/18/22 1046   hydrALAZINE (APRESOLINE) tablet 100 mg, 100 mg, Oral, TID, Larinda Buttery, MD, 100 mg at 05/18/22 1330  insulin aspart (novoLOG) injection 1-3 Units, 1-3 Units, Subcutaneous, Q4H, Julian Hy, DO, 1 Units at 05/17/22 2044   lidocaine (LIDODERM) 5 % 1 patch, 1 patch, Transdermal, Q24H, Clark, Maalik Pinn P, DO   lip balm (CARMEX) ointment, , Topical, PRN, Julian Hy, DO   methocarbamol (ROBAXIN) tablet 500 mg, 500 mg, Oral, Q6H PRN, Jennelle Human B, NP, 500 mg at 05/18/22 0107   Oral care mouth rinse, 15 mL, Mouth Rinse, PRN, Noemi Chapel P, DO   oxyCODONE-acetaminophen (PERCOCET/ROXICET) 5-325 MG per tablet 1-2 tablet, 1-2 tablet, Oral, Q4H PRN, Elsie Lincoln, MD, 2 tablet at 05/18/22 1141   polyethylene glycol (MIRALAX / GLYCOLAX) packet 17 g, 17 g, Oral, Daily, Julian Hy, DO, 17 g at 05/18/22 3557   senna (SENOKOT) tablet 8.6 mg, 1 tablet, Oral, QHS PRN, Julian Hy, DO   topiramate (TOPAMAX) tablet 25 mg, 25 mg, Oral, BID, Shafer, Devon, NP, 25 mg at 05/18/22 3220   Patients Current Diet:  Diet Order                  Diet Heart Room service appropriate? Yes; Fluid consistency: Thin  Diet effective now                         Precautions / Restrictions Precautions Precautions: Fall, Back Precaution Booklet Issued: Yes (comment) Precaution Comments: reviewed 3/3 back precautions and brace wear Spinal Brace: Lumbar corset, Applied in sitting position Restrictions Weight Bearing Restrictions: No Other Position/Activity Restrictions: Brace brought in by spouse    Has the patient had 2 or more falls or a fall with injury in the past year? Yes   Prior Activity Level Community (5-7x/wk): left  the home daily per her report   Prior Functional Level Self Care: Did the patient need help bathing, dressing, using the toilet or eating? Needed some help   Indoor Mobility: Did the patient need assistance with walking from room to room (with or without device)? Needed some help   Stairs: Did the patient need assistance with internal or external stairs (with or without device)? Needed some help   Functional Cognition: Did the patient need help planning regular tasks such as shopping or remembering to take medications? Needed some help   Patient Information Are you of Hispanic, Latino/a,or Spanish origin?: A. No, not of Hispanic, Latino/a, or Spanish origin What is your race?: A. White Do you need or want an interpreter to communicate with a doctor or health care staff?: 0. No   Patient's Response To:  Health Literacy and Transportation Is the patient able to respond to health literacy and transportation needs?: Yes Health Literacy - How often do you need to have someone help you when you read instructions, pamphlets, or other written material from your doctor or pharmacy?: Never In the past 12 months, has lack of transportation kept you from medical appointments or from getting medications?: No In the past 12 months, has lack of transportation kept you from meetings, work, or from getting things needed for daily living?: No   Development worker, international aid / Equipment Home Equipment: BSC/3in1, Civil engineer, contracting, Grab bars - toilet, Grab bars - tub/shower, Toilet riser, Conservation officer, nature (2 wheels), [employer] - single point   Prior Device Use: Indicate devices/aids used by the patient prior to current illness, exacerbation or injury? Walker   Current Functional Level Cognition   Arousal/Alertness: Awake/alert Overall Cognitive Status: Impaired/Different from baseline Current Attention Level: Sustained Orientation Level:  Oriented X4 Safety/Judgement: Decreased awareness of deficits, Decreased awareness of  safety General Comments: pt with right hemianopsia and inattention with inability to recognize deficits or follow commands to turn head to view objects on right without max cues and repeated trials Memory: Impaired Memory Impairment: Retrieval deficit, Storage deficit Awareness: Impaired Awareness Impairment: Anticipatory impairment Problem Solving: Impaired Problem Solving Impairment: Verbal complex    Extremity Assessment (includes Sensation/Coordination)   Upper Extremity Assessment: Generalized weakness  Lower Extremity Assessment: Defer to PT evaluation     ADLs   Grooming: Min guard, Wash/dry hands, Bed level Lower Body Dressing: Moderate assistance, Bed level     Mobility   Overal bed mobility: Needs Assistance Bed Mobility: Rolling, Sidelying to Sit Rolling: Min guard Sidelying to sit: Min assist Sit to sidelying: Min assist General bed mobility comments: cues, increased time and assist to roll left and rise with bed flat     Transfers   Overall transfer level: Needs assistance Equipment used: Rolling walker (2 wheels) Transfers: Sit to/from Stand Sit to Stand: Min assist General transfer comment: assist to rise from bed and BSC with cues for hand placement and posture. Stand pivot bed to [facility] with single UE support with urgent urination, pt able to perform pericare this session     Ambulation / Gait / Stairs / Wheelchair Mobility   Ambulation/Gait Ambulation/Gait assistance: Herbalist (Feet): 80 Feet Assistive device: Rolling walker (2 wheels) Gait Pattern/deviations: Shuffle, Trunk flexed, Narrow base of support General Gait Details: pt with maintained short shuffling steps with tendency to keep RW anterior right to body with max cues and min physical assist for proximity to RW. Pt running into obstacles x 6 in limited gait despite cues for scanning environment and direction. HR 45 during gait Gait velocity: decreaesd Gait velocity interpretation: <1.8  ft/sec, indicate of risk for recurrent falls     Posture / Balance Dynamic Sitting Balance Sitting balance - Comments: static sitting without support Balance Overall balance assessment: Needs assistance Sitting-balance support: No upper extremity supported, Feet supported Sitting balance-Leahy Scale: Fair Sitting balance - Comments: static sitting without support Standing balance support: Bilateral upper extremity supported, During functional activity, Reliant on assistive device for balance Standing balance-Leahy Scale: Poor Standing balance comment: relies on RW and physical assist     Special needs/care consideration Skin intact    Previous Home Environment (from acute therapy documentation) Living Arrangements: Spouse/significant other, Children  Lives With: Spouse, Daughter Available Help at Discharge: Family, Available 24 hours/day Type of Home: House Home Layout: One level Home Access: Stairs to enter Entrance Stairs-Rails: Right, Left Entrance Stairs-Number of Steps: 5 Bathroom Shower/Tub: Multimedia programmer: Standard Bathroom Accessibility: Yes How Accessible: Accessible via walker Additional Comments: pt reports daughter and spouse live at home, sister can come assist as needed   Discharge Living Setting Plans for Discharge Living Setting: Patient's home Type of Home at Discharge: House Discharge Home Layout: One level Discharge Home Access: Stairs to enter Entrance Stairs-Rails: Right, Left Entrance Stairs-Number of Steps: 5 Discharge Bathroom Shower/Tub: Tub/shower unit Discharge Bathroom Toilet: Standard Discharge Bathroom Accessibility: Yes How Accessible: Accessible via walker Does the patient have any problems obtaining your medications?: No   Social/Family/Support Systems Patient Roles: Spouse, Parent Anticipated Caregiver: spouse, donald Anticipated Caregiver's Contact Information: [phone] Caregiver Availability: 24/7 Discharge Plan  Discussed with Primary Caregiver: Yes Is Caregiver In Agreement with Plan?: Yes Does Caregiver/Family have Issues with Lodging/Transportation while Pt is in Rehab?: No   Goals  Patient/Family Goal for Rehab: supervision level Expected length of stay: 7-10 days Additional Information: patient required supervision prior to admission for safety Pt/Family Agrees to Admission and willing to participate: Yes Program Orientation Provided & Reviewed with Pt/Caregiver Including Roles  & Responsibilities: Yes   Decrease burden of Care through IP rehab admission: N/A   Possible need for SNF placement upon discharge: not anticipated   Patient Condition: I have reviewed medical records from [facility] , spoken with CM, and patient. I met with patient at the bedside for inpatient rehabilitation assessment.  Patient will benefit from ongoing PT and OT, can actively participate in 3 hours of therapy a day 5 days of the week, and can make measurable gains during the admission.  Patient will also benefit from the coordinated team approach during an Inpatient Acute Rehabilitation admission.  The patient will receive intensive therapy as well as Rehabilitation physician, nursing, social worker, and care management interventions.  Due to safety, skin/wound care, disease management, medication administration, pain management, and patient education the patient requires 24 hour a day rehabilitation nursing.  The patient is currently min A  with mobility and basic ADLs.  Discharge setting and therapy post discharge at home with home health is anticipated.  Patient has agreed to participate in the Acute Inpatient Rehabilitation Program and will admit today.   Preadmission Screen Completed By:  Amanda Cockayne, 05/18/2022 3:33 PM ______________________________________________________________________   Discussed status with Dr. Dagoberto Ligas on 05/22/22 at 70 and received approval for admission today.   Admission  Coordinator:  Clemens Catholic, [place], [place] Rehab Admissions Coordinator, time 1400/Date 05/22/22    Assessment/Plan: Diagnosis: Does the need for close, 24 hr/day Medical supervision in concert with the patient's rehab needs make it unreasonable for this patient to be served in a less intensive setting? Yes Co-Morbidities requiring supervision/potential complications: L PCA infarct, R hemiparesis; HTN emergency; R hemianopsia Due to bladder management, bowel management, safety, skin/wound care, disease management, medication administration, pain management, and patient education, does the patient require 24 hr/day rehab nursing? Yes Does the patient require coordinated care of a physician, rehab nurse, PT, OT, and SLP to address physical and functional deficits in the context of the above medical diagnosis(es)? Yes Addressing deficits in the following areas: balance, endurance, locomotion, strength, transferring, bowel/bladder control, bathing, dressing, feeding, grooming, toileting, and cognition Can the patient actively participate in an intensive therapy program of at least 3 hrs of therapy 5 days a week? Yes The potential for patient to make measurable gains while on inpatient rehab is good Anticipated functional outcomes upon discharge from inpatient rehab: supervision PT, supervision OT, supervision SLP Estimated rehab length of stay to reach the above functional goals is: 7-10 days Anticipated discharge destination: Home 10. Overall Rehab/Functional Prognosis: good     MD Signature:

## 2022-05-22 NOTE — Progress Notes (Signed)
Inpatient Rehab Admissions Coordinator:   I do not yet have insurance auth for CIR. I can admit once I receive authorization. Updated Pt. And daughter.  Clemens Catholic, [place], [place] Admissions Coordinator  [phone] ([place]) [phone] (office)

## 2022-05-22 NOTE — Progress Notes (Signed)
Patient alert and oriented and admitted to floor this shift. Patient oriented to unit, safety and made aware of plan.

## 2022-05-22 NOTE — H&P (Signed)
Physical Medicine and Rehabilitation Admission H&P     CC: Functional deficits secondary to acute left PCA infarct 7/3   HPI: Robin Arellano is a 71 year old female who presented to the emergency department on 05/14/2022 after sustaining a fall at approximately 2 AM.  She had recently undergone back surgery and had taken a Robaxin for left leg pain.  She had stopped aspirin therapy prior to surgery and had not restarted it.  She fell backwards and denied loss of consciousness, headache or dizziness.  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  Cardiothoracic surgery consulted.  Admitted to ICU and started on esmolol for hypertensive emergency.  Vascular surgery was also consulted to evaluate her descending thoracic aortic disease.  She was seen by Dr. Stanford Breed on 7/4 with plans to repeat CT angiogram of chest abdomen and pelvis in 48 hours.  This was delayed due to acute kidney injury.  Arrangements made to perform this as outpatient.  No surgical intervention advised during hospitalization.  CT of the head revealed suspicious acute left infarct and MRI of the brain confirmed an acute/subacute left PCA infarct and neurology was consulted for stroke work-up.  Plavix and aspirin started on 7/5 for 3 weeks then aspirin alone.  She is tolerating heart healthy diet. The patient requires inpatient physical medicine and rehabilitation evaluations and treatment secondary to dysfunction due to all deficits secondary to acute left PCA infarct and recent back surgery.   Pt c/o abd pain- thinks from heparin shots vs referred from back.  Oral thrush has resolved- Comes and goes.  CT angiogram will be outpt.  Voiding OK.  LBM this AM- no constipation.    The patient is status post bilateral laminectomy L4-L5 decompression of L4 and L5 nerve roots secondary to spondylolisthesis of L4-L5  with L4 radiculopathy bilaterally on 04/26/2022 by Dr. Ellene Route.   Review of Systems  Constitutional:  Negative for chills and fever.  HENT:  Negative for hearing loss and sore throat.   Eyes:  Negative for blurred vision and double vision.  Respiratory:  Negative for cough and shortness of breath.   Cardiovascular:  Negative for chest pain and palpitations.  Gastrointestinal:  Positive for abdominal pain. Negative for constipation, nausea and vomiting.       Says her abdomen is painful today and attributes it to heparin injections  Genitourinary:  Negative for dysuria and urgency.  Musculoskeletal:        Both feet are uncomfortable: numb and heavy  Skin:        Complains of multiple bruises  Neurological:  Negative for dizziness and headaches.  All other systems reviewed and are negative.       Past Medical History:  Diagnosis Date   Anxiety      on meds   Back pain     Chronic female pelvic pain     Coronary artery disease      mild, non-obstructive 11/2018   Depression      on meds   Family history of adverse reaction to anesthesia      sister had difficulty waking up   Fibromyalgia     H/O leukocytosis     Headache     Hyperlipidemia      on meds   Hypertension      on meds   MI (myocardial infarction) ([place])  Pt states she did not have a MI- EKG was normal, was GERD   Osteoarthritis      on meds   Ovarian cyst, right     PONV (postoperative nausea and vomiting)     Post-operative nausea and vomiting     SVD (spontaneous vaginal delivery)      x 2   Vitamin D deficiency           Past Surgical History:  Procedure Laterality Date   ABDOMINAL HYSTERECTOMY   1994    TAH.BSO   ANTERIOR CERVICAL DECOMP/DISCECTOMY FUSION   2019   APPENDECTOMY   1975   BACK SURGERY   2023   BIOPSY   05/12/2020    Procedure: BIOPSY;  Surgeon: Milus Banister, MD;  Location: [facility];  Service: Endoscopy;;   CARDIAC CATHETERIZATION   2020   CHOLECYSTECTOMY N/A 07/21/2020     Procedure: LAPAROSCOPIC CHOLECYSTECTOMY WITH INTRAOPERATIVE CHOLANGIOGRAM;  Surgeon: [name], MD;  Location: [facility];  Service: General;  Laterality: N/A;   COLONOSCOPY   08/12/2017    Hx TA (piecemeal)Jacobs-MAC-suprep (good)   ESOPHAGOGASTRODUODENOSCOPY (EGD) WITH PROPOFOL N/A 05/12/2020    Procedure: ESOPHAGOGASTRODUODENOSCOPY (EGD) WITH PROPOFOL;  Surgeon: Milus Banister, MD;  Location: [facility];  Service: Endoscopy;  Laterality: N/A;   EUS N/A 05/12/2020    Procedure: UPPER ENDOSCOPIC ULTRASOUND (EUS) RADIAL;  Surgeon: Milus Banister, MD;  Location: [facility];  Service: Endoscopy;  Laterality: N/A;   KNEE SURGERY Bilateral 1996    x 2 - arthroscopic   LEFT HEART CATH AND CORONARY ANGIOGRAPHY N/A 11/25/2018    Procedure: LEFT HEART CATH AND CORONARY ANGIOGRAPHY;  Surgeon: Troy Sine, MD;  Location: [facility];  Service: Cardiovascular;  Laterality: N/A;   PELVIC LAPAROSCOPY   1989    W LYSIS OF ADHESIONS/L SALPINGONEOSTOMY   TUBAL LIGATION       WISDOM TOOTH EXTRACTION             Family History  Problem Relation Age of Onset   Uterine cancer Mother 63   Aneurysm Father     Heart disease Paternal Grandfather     COPD Brother     Skin cancer Brother     Other Sister          MGUS    Skin cancer Brother     Other Sister          MA   Colon cancer Neg Hx     Rectal cancer Neg Hx     Stomach cancer Neg Hx     Stroke Neg Hx     Neuropathy Neg Hx     Colon polyps Neg Hx     Esophageal cancer Neg Hx      Social History:  reports that she quit smoking about 23 years ago. Her smoking use included cigarettes. She has a 3.00 pack-year smoking history. She has never used smokeless tobacco. She reports that she does not currently use alcohol. She reports that she does not use drugs. Allergies:      Allergies  Allergen Reactions   Flexeril [Cyclobenzaprine] Anaphylaxis, Hives, Itching and Swelling   Lidocaine Hives   Zanaflex [Tizanidine] Anaphylaxis,  Hives, Itching and Swelling   Latex Rash          Medications Prior to Admission  Medication Sig Dispense Refill   ALPRAZolam (XANAX) 1 MG tablet Take 0.5 mg by mouth daily as needed for sleep or anxiety.  amLODipine (NORVASC) 10 MG tablet TAKE 1 TABLET(10 MG) BY MOUTH DAILY (Patient taking differently: Take 10 mg by mouth daily.) 90 tablet 3   Ascorbic Acid (VITAMIN C) 1000 MG tablet Take 1,000 mg by mouth in the morning.       aspirin EC 81 MG tablet Take 81 mg by mouth as needed (chest pain). Swallow whole.       Calcium Carbonate Antacid (TUMS PO) Take 2 tablets by mouth daily as needed (stomach pain).       Cholecalciferol (VITAMIN D) 50 MCG (2000 UT) tablet Take 2,000 Units by mouth in the morning.       ezetimibe (ZETIA) 10 MG tablet TAKE 1 TABLET(10 MG) BY MOUTH DAILY (Patient taking differently: Take 10 mg by mouth daily.) 90 tablet 3   hydrALAZINE (APRESOLINE) 100 MG tablet Take 1 tablet (100 mg total) by mouth 3 (three) times daily. (Patient taking differently: Take 100 mg by mouth in the morning.) 270 tablet 1   methocarbamol (ROBAXIN) 500 MG tablet Take 1 tablet (500 mg total) by mouth every 6 (six) hours as needed for muscle spasms. 30 tablet 3   methylPREDNISolone (MEDROL DOSEPAK) 4 MG TBPK tablet Take 4 mg by mouth in the morning, at noon, in the evening, and at bedtime.       olmesartan (BENICAR) 40 MG tablet Take 1 tablet (40 mg total) by mouth daily. (Patient taking differently: Take 20 mg by mouth in the morning.) 90 tablet 3   valACYclovir (VALTREX) 500 MG tablet TAKE 1 TABLET BY MOUTH TWICE DAILY FOR 3 TO 5 DAYS THEN TAKE DAILY AS NEEDED (Patient taking differently: Take 500 mg by mouth 2 (two) times daily as needed (shingles flair up).) 30 tablet 0   [DISCONTINUED] oxyCODONE-acetaminophen (PERCOCET/ROXICET) 5-325 MG tablet Take 1-2 tablets by mouth every 4 (four) hours as needed for moderate pain or severe pain. (Patient taking differently: Take 1 tablet by mouth every 6  (six) hours as needed for moderate pain or severe pain.) 60 tablet 0   HYDROcodone-acetaminophen (NORCO) 10-325 MG tablet Take 1 tablet by mouth 3 (three) times daily as needed for moderate pain. (Patient not taking: Reported on 05/14/2022)       zolpidem (AMBIEN) 10 MG tablet Take 10 mg by mouth at bedtime as needed for sleep.  (Patient not taking: Reported on 05/14/2022)              Home: Home Living Family/patient expects to be discharged to:: Private residence Living Arrangements: Spouse/significant other, Children Available Help at Discharge: Family, Available 24 hours/day Type of Home: House Home Access: Stairs to enter [employer] of Steps: 5 Entrance Stairs-Rails: Right, Left Home Layout: One level Bathroom Shower/Tub: Multimedia programmer: Standard Bathroom Accessibility: Yes Home Equipment: BSC/3in1, Shower seat, Grab bars - toilet, Grab bars - tub/shower, Toilet riser, Conservation officer, nature (2 wheels), [employer] - single point Additional Comments: pt reports daughter and spouse live at home, sister can come assist as needed  Lives With: Spouse, Daughter   Functional History: Prior Function Prior Level of Function : Needs assist, History of Falls (last six months) Physical Assist : Mobility (physical), ADLs (physical) Mobility (physical): Transfers, Gait, Stairs ADLs (physical): Bathing, Dressing, Toileting, IADLs Mobility Comments: uses RW at all times, no longer uses cane due to falls ADLs Comments: pt reports daughter or sister assist with ADLs as needed   Functional Status:  Mobility: Bed Mobility Overal bed mobility: Needs Assistance Bed Mobility: Sidelying to Sit, Sit to  Supine Rolling: Min guard Sidelying to sit: Min assist Sit to supine: Min guard Sit to sidelying: Min assist General bed mobility comments: Verbal cues to perform rolling and sidelying to sit for back precautions. Assist to elevate trunk into sitting. Returning to supine pt does not  follow back precautions and is in supine before I was able to cue her to go to sidelying Transfers Overall transfer level: Needs assistance Equipment used: Rolling walker (2 wheels), None Transfers: Sit to/from Stand, Bed to chair/wheelchair/BSC Sit to Stand: Min assist Bed to/from chair/wheelchair/BSC transfer type:: Step pivot Step pivot transfers: Min assist General transfer comment: Assist to power up and for balance. Bed to bsc to bed with assist for balance. Ambulation/Gait Ambulation/Gait assistance: Min assist Gait Distance (Feet): 140 Feet Assistive device: Rolling walker (2 wheels) Gait Pattern/deviations: Step-to pattern, Decreased step length - right, Decreased step length - left, Shuffle, Trunk flexed, Narrow base of support General Gait Details: Assist for balance and for inattention to rt. Pt ran walker into objects x 5 on rt. Verbal cues to incr step length with pt responding for 2-3 steps and then back to very short step length. Worked on taking 2-3 steps forward and backward without walker and with hand held to work on increasing step length. Gait velocity: decr Gait velocity interpretation: <1.8 ft/sec, indicate of risk for recurrent falls   ADL: ADL Overall ADL's : Needs assistance/impaired Grooming: Wash/dry hands, Wash/dry face, Supervision/safety, Sitting Grooming Details (indicate cue type and reason): in recliner Upper Body Dressing : Minimal assistance, Cueing for sequencing, Sitting Lower Body Dressing: Moderate assistance, Sitting/lateral leans, Cueing for compensatory techniques Lower Body Dressing Details (indicate cue type and reason): assistance to donn socks with difficutly crossing legs to don Toilet Transfer: Minimal assistance, Rolling walker (2 wheels), Ambulation Toilet Transfer Details (indicate cue type and reason): transfer to [facility] from EOB with RW and min assist Toileting- Clothing Manipulation and Hygiene: Supervision/safety, Sitting/lateral  lean Toileting - Clothing Manipulation Details (indicate cue type and reason): performed toilet hygiene seated General ADL Comments: Patient required cues for safety and attending to right   Cognition: Cognition Overall Cognitive Status: Impaired/Different from baseline Arousal/Alertness: Awake/alert Orientation Level: Oriented X4 Year: 2023 Month: July Day of Week: Correct Memory: Impaired Memory Impairment: Retrieval deficit, Storage deficit Awareness: Impaired Awareness Impairment: Anticipatory impairment Problem Solving: Impaired Problem Solving Impairment: Verbal complex Cognition Arousal/Alertness: Awake/alert Behavior During Therapy: WFL for tasks assessed/performed Overall Cognitive Status: Impaired/Different from baseline Area of Impairment: Awareness, Problem solving, Safety/judgement, Attention, Memory Current Attention Level: Sustained, Selective Memory: Decreased short-term memory Safety/Judgement: Decreased awareness of deficits, Decreased awareness of safety Awareness: Emergent Problem Solving: Requires verbal cues, Requires tactile cues General Comments: aware of visual deficits,turns head to locate items on right   Physical Exam: Blood pressure (!) 139/48, pulse (!) 57, temperature 98.2 F (36.8 C), temperature source Oral, resp. rate 15, height _0  (1.6 m), weight 71 kg, SpO2 96 %. Physical Exam Vitals and nursing note reviewed. Exam conducted with a chaperone present.  Constitutional:      Appearance: Normal appearance. She is normal weight. She is not ill-appearing.     Comments: Sitting up in bed slightly- husband at bedside; appears younger than stated age, NAD  HENT:     Head: Normocephalic and atraumatic.     Comments: No facial droop Tongue midline    Right Ear: External ear normal.     Left Ear: External ear normal.     Nose: Nose normal. No congestion.  Mouth/Throat:     Mouth: Mucous membranes are moist.     Pharynx: Oropharynx is clear.  No oropharyngeal exudate.  Eyes:     General:        Right eye: No discharge.        Left eye: No discharge.     Extraocular Movements: Extraocular movements intact.     Pupils: Pupils are equal, round, and reactive to light.     Comments: R hemianopsia noted  Cardiovascular:     Rate and Rhythm: Normal rate and regular rhythm.     Heart sounds: Normal heart sounds. No murmur heard.    No gallop.  Pulmonary:     Effort: Pulmonary effort is normal. No respiratory distress.     Breath sounds: Normal breath sounds. No stridor. No wheezing or rales.  Abdominal:     General: Abdomen is flat. Bowel sounds are normal. There is no distension.     Palpations: Abdomen is soft.     Tenderness: There is abdominal tenderness.     Comments: Multpile injection bruises All over the abdomen  Musculoskeletal:     Cervical back: Neck supple. No tenderness.     Right lower leg: No edema.     Left lower leg: No edema.     Comments: RUE- 4+/5 throughout LUE 5/5 RLE_ 4-/5 throughout LLE- 5/5   Skin:    General: Skin is warm and dry.     Comments: Back incision continues to heal nicely Abd bruising as detailed above  Neurological:     Mental Status: She is alert and oriented to person, place, and time.     Comments: Intact to light touch in all 4 extremties  Psychiatric:     Comments: Slightly decreased memory        Lab Results Last 48 Hours        Results for orders placed or performed during the hospital encounter of 05/14/22 (from the past 48 hour(s))  Basic metabolic panel     Status: Abnormal    Collection Time: 05/21/22 12:36 AM  Result Value Ref Range    Sodium 133 (L) 135 - 145 mmol/L    Potassium 3.8 3.5 - 5.1 mmol/L    Chloride 102 98 - 111 mmol/L    CO2 20 (L) 22 - 32 mmol/L    Glucose, Bld 103 (H) 70 - 99 mg/dL      Comment: Glucose reference range applies only to samples taken after fasting for at least 8 hours.    BUN 27 (H) 8 - 23 mg/dL    Creatinine, Ser 2.06 (H) 0.44  - 1.00 mg/dL    Calcium 9.0 8.9 - 10.3 mg/dL    GFR, Estimated 25 (L) >60 mL/min      Comment: (NOTE) Calculated using the CKD-EPI Creatinine Equation (2021)      Anion gap 11 5 - 15      Comment: Performed at [facility] [address] [place], [place] [address]  CBC     Status: Abnormal    Collection Time: 05/22/22  3:58 AM  Result Value Ref Range    WBC 8.8 4.0 - 10.5 K/uL    RBC 2.75 (L) 3.87 - 5.11 MIL/uL    Hemoglobin 8.8 (L) 12.0 - 15.0 g/dL    HCT 26.6 (L) 36.0 - 46.0 %    MCV 96.7 80.0 - 100.0 fL    MCH 32.0 26.0 - 34.0 pg    MCHC 33.1 30.0 -  36.0 g/dL    RDW 14.3 11.5 - 15.5 %    Platelets 271 150 - 400 K/uL    nRBC 0.0 0.0 - 0.2 %      Comment: Performed at [facility], [place] [address] [place], [place] [address]  Comprehensive metabolic panel     Status: Abnormal    Collection Time: 05/22/22  3:58 AM  Result Value Ref Range    Sodium 134 (L) 135 - 145 mmol/L    Potassium 3.7 3.5 - 5.1 mmol/L    Chloride 107 98 - 111 mmol/L    CO2 21 (L) 22 - 32 mmol/L    Glucose, Bld 94 70 - 99 mg/dL      Comment: Glucose reference range applies only to samples taken after fasting for at least 8 hours.    BUN 19 8 - 23 mg/dL    Creatinine, Ser 1.72 (H) 0.44 - 1.00 mg/dL    Calcium 8.8 (L) 8.9 - 10.3 mg/dL    Total Protein 6.5 6.5 - 8.1 g/dL    Albumin 3.1 (L) 3.5 - 5.0 g/dL    AST 22 15 - 41 U/L    ALT 18 0 - 44 U/L    Alkaline Phosphatase 106 38 - 126 U/L    Total Bilirubin 0.4 0.3 - 1.2 mg/dL    GFR, Estimated 31 (L) >60 mL/min      Comment: (NOTE) Calculated using the CKD-EPI Creatinine Equation (2021)      Anion gap 6 5 - 15      Comment: Performed at [facility], [place] [address], [place], [place] [address]      Imaging Results (Last 48 hours)  No results found.         Blood pressure (!) 139/48, pulse (!) 57, temperature 98.2 F (36.8 C), temperature source Oral, resp. rate 15, height _0  (1.6 m), weight 71 kg, SpO2 96 %.   Medical  Problem List and Plan: 1. Functional deficits secondary to acute left PCA infarct, recent L4-L5 lumbar fusion             -patient may  shower             -ELOS/Goals: 7-10 days supervision 2.  Antithrombotics: -DVT/anticoagulation:  Pharmaceutical: Lovenox (changed from heparin)             -antiplatelet therapy: Aspirin and Plavix started 7/5 for 3 weeks then aspirin alone 3. Pain Management: Topamax 25 mg twice daily             -Fioricet 1 every 6 hours as needed             -Robaxin 500 mg every 6 hours as needed             -Percocet 5/325 6 hours as needed 4. Mood/Behavior/Sleep: CSW to evaluate and provide emotional support             -Xanax 0.5 mg q HS as needed             -antipsychotic agents: n/a 5. Neuropsych/cognition: This patient is capable of making decisions on her own behalf. 6. Skin/Wound Care: Routine skin care checks 7. Fluids/Electrolytes/Nutrition: Routine Is and Os and follow-up chemistries 8.  Hypertension: Continue amlodipine 10 mg daily             - continue chlorthalidone 25 mg daily             -continue clonidine 0.1 mg  nightly             -continue hydralazine 100 mg 3 times daily 9.  Hyperlipidemia: Continue atorvastatin 40 mg daily 10: Oral thrush: Continue nystatin suspension 4 times daily 11: Acute kidney injury atop chronic kidney disease stage IIIb: Improved ; follow-up BMP.  Baseline serum creatinine 1.4-1.6 12: Mild hyponatremia: Follow-up BMP 13: Anemia of chronic disease: Asymptomatic, follow-up CBC 14: Chronic back pain status post lumbar surgery 6/15             -Follow-up with Dr. Ellene Route 15: Aortic arch and descending thoracic aortic disease:             -Follow-up CTA as outpatient             -Follow-up CT surgery, vascular surgery   I have personally performed a face to face diagnostic evaluation of this patient and formulated the key components of the plan.  Additionally, I have personally reviewed laboratory data, imaging studies,  as well as relevant notes and concur with the physician assistant's documentation above.   The patient's status has not changed from the original H&P.  Any changes in documentation from the acute care chart have been noted above.       Barbie Banner, PA-C 05/22/2022

## 2022-05-22 NOTE — Progress Notes (Signed)
Inpatient Rehabilitation Admission Medication Review by a Pharmacist  A complete drug regimen review was completed for this patient to identify any potential clinically significant medication issues.  High Risk Drug Classes Is patient taking? Indication by Medication  Antipsychotic Yes Copmazine- N/V  Anticoagulant Yes Heparin- VTE prophylaxis  Antibiotic Yes Nystatin susp- oral thrush  Opioid No   Antiplatelet Yes Aspirin, plavix- CVA prophylaxis   Hypoglycemics/insulin No   Vasoactive Medication Yes Norvasc, hygroton, catapres, apresoline- hypertension  Chemotherapy No   Other Yes Trazodone- sleep Xanax- anxiety Fioricet- HA Zetia, lipitor- HLD Topamax- HA prophylaxis     Type of Medication Issue Identified Description of Issue Recommendation(s)  Drug Interaction(s) (clinically significant)     Duplicate Therapy     Allergy     No Medication Administration End Date     Incorrect Dose     Additional Drug Therapy Needed     Significant med changes from prior encounter (inform family/care partners about these prior to discharge).    Other  PTA meds: Benicar 20 mg qday Restart PTA meds when and if necessary during CIR admission or at time of discharge, if warranted    Clinically significant medication issues were identified that warrant physician communication and completion of prescribed/recommended actions by midnight of the next day:  No  Time spent performing this drug regimen review (minutes):  30   Rivkah Wolz BS, PharmD, BCPS Clinical Pharmacist 05/22/2022 3:11 PM  Contact: [phone] after 3 PM  "Be curious, not judgmental..." -Jamal Maes

## 2022-05-22 NOTE — Progress Notes (Signed)
[facility] PROGRESS NOTE   Robin Arellano GBT:[id number] DOB: 1950-12-03 DOA: 05/14/2022  PCP: Sandi Mariscal, MD  Brief History/Interval Summary: H/o fibromyalgia, chronic back pain, s/p recent L4-L5 posterior lumbar fusion onn 6/15 by Dr Ellene Route, h/o  CAD, HTN, HLD with statin intolerance,  CKDIII and former smoker who presents 7/3 after sustaining a fall in which her left leg gave out causing her to fall backwards.  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 MRI brain confirmed acute/subacute left PCA infarct She is started on esmolol drip and Admitted to icu due to hypertension emergency Seen by thoracic surgery, vascular surgery, neurology, cardiology Improving transfer to hospitalist service on 7/7   Consultants: Seen by critical care medicine.  Vascular surgery.  Cardiology.  Procedures: None yet    Subjective/Interval History: Patient mentioned that she is feeling well.  Denies any chest pain shortness of breath.  No nausea vomiting.  Back pain persists but stable.     Assessment/Plan:  Hypertensive emergency  Initially admitted to ICU on esmolol drip.  Blood pressure has improved.  Patient was seen by cardiology.  Currently noted to be on amlodipine, clonidine, hydralazine.  Clonidine dose was decreased due to bradycardia.  Cardiology has signed off. Blood pressure is reasonably well controlled.   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   Seen by vascular surgery. Patient appears to be asymptomatic.   Due to elevated creatinine CT angiogram could not be repeated.  She was hydrated.  Seen by vascular surgery again yesterday and they now feel that this can be pursued in the  outpatient setting.     Subacute left PCA branch infarct with left hemianopia Seen by neurology Etiology is likely embolic from cryptogenic source.  Recommend ongoing stroke evaluation and aggressive risk factor modification.  She may need prolonged cardiac monitoring for paroxysmal A-fib.  Will need 30-day heart monitor at discharge from inpatient rehabilitation.  Message was sent to cardiology to arrange.  She is followed by Dr. Claiborne Billings in the outpatient setting. Aspirin Plavix for 3 weeks followed by aspirin alone. Vision impairment and right-sided weakness has improved   AKI on CKD IIIb Baseline creatinine seems to be around 1.4-1.6.  Creatinine has been increasing here in the hospital.  Likely multifactorial including hemodynamically mediated along with the fact that she received IV contrast during earlier part of this admission.   IV fluid rate was increased yesterday.  Pain has improved to 1.72 this morning.  We will stop IV fluids since now CT angiogram to be pursued only in the outpatient setting.  We will recheck labs tomorrow.  Renal ultrasound no obstructive nephropathy UA unremarkable.  For some reason urine culture was also sent which is growing 50,000 colonies of E. coli.  UA was unremarkable.  This could be just colonization.  Will not be treated. Good urine output noted.   Anemia of chronic disease No overt bleeding.  Hemoglobin has been stable.     Oral thrush Nystatin solution   Falls/FTT: Seen by PT recommend CIR once medically stable   Back pain with recent back surgery Underwent L4-L5 laminectomy and decompression of L4 and L5 nerve roots.  This was done on Sophronia 15 by Dr. Ellene Route.  Seems to be stable.  Occasional  back pain is present.    Right sacral insufficiency fracture Incidentally noted on CT scan.  Defer to neurosurgery in the outpatient setting.  Neurological status is stable.  Biliary ductal dilatation Noted incidentally on CT scan.  LFTs unremarkable.   Outpatient follow-up   DVT Prophylaxis: Subcutaneous heparin Code Status: Full code Family Communication: Discussed with patient.  No family at bedside Disposition Plan: Inpatient rehabilitation was denied by insurance company.  Appeal in progress.  Status is: Inpatient Remains inpatient appropriate because: Hypertensive emergency, acute kidney injury      Medications: Scheduled:  sodium chloride   Intravenous Once   amLODipine  10 mg Oral Daily   aspirin  81 mg Oral Daily   atorvastatin  40 mg Oral Daily   bisacodyl  10 mg Rectal Daily   chlorthalidone  25 mg Oral Daily   cloNIDine  0.1 mg Oral QHS   clopidogrel  75 mg Oral Daily   docusate sodium  100 mg Oral Daily   ezetimibe  10 mg Oral Daily   heparin injection (subcutaneous)  5,000 Units Subcutaneous Q8H   hydrALAZINE  100 mg Oral TID   nystatin  5 mL Oral QID   polyethylene glycol  17 g Oral Daily   senna  1 tablet Oral QHS   topiramate  25 mg Oral BID   Continuous:   EPP:IRJJOACZYS, butalbital-acetaminophen-caffeine, hydrALAZINE, lip balm, methocarbamol, ondansetron (ZOFRAN) IV, mouth rinse, oxyCODONE-acetaminophen, sodium phosphate  Antibiotics: Anti-infectives (From admission, onward)    None       Objective:  Vital Signs  Vitals:   05/22/22 0338 05/22/22 0412 05/22/22 0524 05/22/22 0810  BP: (!) 164/77  (!) 157/57 (!) 153/59  Pulse: 60   (!) 56  Resp: '15  14 16  '$ Temp: 98.4 F (36.9 C)   98 F (36.7 C)  TempSrc: Oral   Oral  SpO2: 96%   98%  Weight:  71 kg    Height:        Intake/Output Summary (Last 24 hours) at 05/22/2022 0936 Last data filed at 05/22/2022 0600 Gross per 24 hour  Intake 2429.99 ml  Output 1500 ml  Net 929.99 ml    Filed Weights   05/21/22 0039 05/21/22 0134 05/22/22 0412  Weight: 69.3 kg 69.3 kg 71 kg    General appearance: Awake alert.  In no distress Resp: Clear to auscultation bilaterally.  Normal effort Cardio: S1-S2 is normal regular.  No S3-S4.  No rubs  murmurs or bruit GI: Abdomen is soft.  Nontender nondistended.  Bowel sounds are present normal.  No masses organomegaly Extremities: No edema.  Neurologic: Alert and oriented x3.  No focal neurological deficits.     Lab Results:  Data Reviewed: I have personally reviewed following labs and reports of the imaging studies  CBC: Recent Labs  Lab 05/16/22 0224 05/17/22 0138 05/19/22 0050 05/22/22 0358  WBC 8.6 6.5 6.9 8.8  NEUTROABS  --   --  3.9  --   HGB 9.3* 8.6* 9.0* 8.8*  HCT 28.2* 26.0* 27.4* 26.6*  MCV 97.6 97.7 98.9 96.7  PLT 376 350 311 271     Basic Metabolic Panel: Recent Labs  Lab 05/17/22 0138 05/19/22 0050 05/20/22 0053 05/21/22 0036 05/22/22 0358  NA 133* 136 133* 133* 134*  K 4.4 3.9 4.0 3.8 3.7  CL 102 108 102 102 107  CO2 19* 21* 20* 20* 21*  GLUCOSE 110* 97 95 103* 94  BUN 27* 25* 28* 27* 19  CREATININE  1.98* 1.95* 2.00* 2.06* 1.72*  CALCIUM 8.5* 8.7* 8.7* 9.0 8.8*  MG 2.3  --   --   --   --      GFR: Estimated Creatinine Clearance: 28.3 mL/min (A) (by C-G formula based on SCr of 1.72 mg/dL (H)).  Liver Function Tests: Recent Labs  Lab 05/19/22 0050 05/22/22 0358  AST 25 22  ALT 15 18  ALKPHOS 106 106  BILITOT 0.5 0.4  PROT 6.2* 6.5  ALBUMIN 3.0* 3.1*      CBG: Recent Labs  Lab 05/19/22 0014 05/19/22 0438 05/19/22 0808 05/19/22 1141 05/19/22 2052  GLUCAP 107* 99 119* 97 102*      Recent Results (from the past 240 hour(s))  MRSA Next Gen by PCR, Nasal     Status: None   Collection Time: 05/14/22 10:00 PM   Specimen: Nasal Mucosa; Nasal Swab  Result Value Ref Range Status   MRSA by PCR Next Gen NOT DETECTED NOT DETECTED Final    Comment: (NOTE) The GeneXpert MRSA Assay (FDA approved for NASAL specimens only), is one component of a comprehensive MRSA colonization surveillance program. It is not intended to diagnose MRSA infection nor to guide or monitor treatment for MRSA infections. Test performance is not FDA  approved in patients less than 23 years old. Performed at [facility], [place] [address] [place], [place] [address]   Culture, blood (Routine X 2) w Reflex to ID Panel     Status: None   Collection Time: 05/15/22  9:15 AM   Specimen: BLOOD LEFT HAND  Result Value Ref Range Status   Specimen Description BLOOD LEFT HAND  Final   Special Requests   Final    BOTTLES DRAWN AEROBIC AND ANAEROBIC Blood Culture adequate volume   Culture   Final    NO GROWTH 5 DAYS Performed at [facility], [place] [address], [place], [place] [address]    Report Status 05/20/2022 FINAL  Final  Culture, blood (Routine X 2) w Reflex to ID Panel     Status: None   Collection Time: 05/15/22  9:23 AM   Specimen: BLOOD  Result Value Ref Range Status   Specimen Description BLOOD RIGHT ANTECUBITAL  Final   Special Requests   Final    BOTTLES DRAWN AEROBIC AND ANAEROBIC Blood Culture adequate volume   Culture   Final    NO GROWTH 5 DAYS Performed at [facility], [place] [address], [place], [place] [address]    Report Status 05/20/2022 FINAL  Final  Urine Culture     Status: Abnormal   Collection Time: 05/19/22  6:27 PM   Specimen: Urine, Clean Catch  Result Value Ref Range Status   Specimen Description URINE, CLEAN CATCH  Final   Special Requests   Final    NONE Performed at [facility], [place] [address], [place], [place] [address]    Culture 50,000 COLONIES/mL ESCHERICHIA COLI (A)  Final   Report Status 05/21/2022 FINAL  Final   Organism ID, Bacteria ESCHERICHIA COLI (A)  Final      Susceptibility   Escherichia coli - MIC*    AMPICILLIN <=2 SENSITIVE Sensitive     CEFAZOLIN <=4 SENSITIVE Sensitive     CEFEPIME <=0.12 SENSITIVE Sensitive     CEFTRIAXONE <=0.25 SENSITIVE Sensitive     CIPROFLOXACIN <=0.25 SENSITIVE Sensitive     GENTAMICIN <=1 SENSITIVE Sensitive     IMIPENEM <=0.25 SENSITIVE Sensitive     NITROFURANTOIN <=16  SENSITIVE Sensitive     TRIMETH/SULFA <=20 SENSITIVE  Sensitive     AMPICILLIN/SULBACTAM <=2 SENSITIVE Sensitive     PIP/TAZO <=4 SENSITIVE Sensitive     * 50,000 COLONIES/mL ESCHERICHIA COLI      Radiology Studies: No results found.     LOS: 8 days   Shonice Arellano [employer] on [email or web address]  05/22/2022, 9:36 AM

## 2022-05-22 NOTE — Progress Notes (Signed)
Inpatient Rehab Admissions Coordinator:    I have a CIR bed for this pt. Today. RN may call report to [phone].  Jameon Deller, MS, CCC-SLP Rehab Admissions Coordinator  [phone] (celll) [phone] (office)  

## 2022-05-22 NOTE — Discharge Summary (Signed)
[facility]  Physician Discharge Summary   Patient ID: Robin Arellano MRN: [id number] DOB/AGE: [birth year] 71 y.o.  Admit date: 05/14/2022 Discharge date:   05/22/2022   PCP: Sandi Mariscal, MD  DISCHARGE DIAGNOSES:  Principal Problem:   Pseudoaneurysm ([place]) Active Problems:   Hypertensive emergency AKI Acute CVA   PATIENT IS BEING DISCHARGED TO [facility]  RECOMMENDATIONS FOR OUTPATIENT FOLLOW UP: Patient will need outpatient follow-up with vascular surgery for repeat CT angiogram in the next few weeks Dr. Evette Georges office was contacted for 30-day heart monitor.  This may have to be rescheduled till after she is discharged from inpatient rehabilitation. Aspirin AND Plavix for 3 weeks followed by aspirin alone. Check basic metabolic panel in 2 to 3 days.   Home Health: Going to CIR Equipment/Devices: None  CODE STATUS: Full code  DISCHARGE CONDITION: fair  Diet recommendation: Heart healthy  INITIAL HISTORY: Brief History/Interval Summary: H/o fibromyalgia, chronic back pain, s/p recent L4-L5 posterior lumbar fusion onn 6/15 by Dr Ellene Route, h/o  CAD, HTN, HLD with statin intolerance,  CKDIII and former smoker who presents 7/3 after sustaining a fall in which her left leg gave out causing her to fall backwards.  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 MRI brain confirmed acute/subacute left PCA infarct She is started on esmolol drip and Admitted to icu due to hypertension emergency Seen by thoracic surgery, vascular surgery, neurology, cardiology Improving transfer to hospitalist service on 7/7     Consultants: Seen by critical care medicine.  Vascular surgery.  Cardiology.   Procedures: None    HOSPITAL COURSE:   Hypertensive emergency  Initially admitted to ICU on esmolol drip.  Blood pressure has improved.  Patient  was seen by cardiology.  Currently noted to be on amlodipine, clonidine, hydralazine.  Clonidine dose was decreased due to bradycardia.  Cardiology has signed off. Blood pressure is reasonably well controlled.   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   Seen by vascular surgery. Patient appears to be asymptomatic.   Due to elevated creatinine CT angiogram could not be repeated.  She was hydrated.  Seen by vascular surgery again yesterday and they now feel that this can be pursued in the outpatient setting.     Subacute left PCA branch infarct with left hemianopia Seen by neurology Etiology is likely embolic from cryptogenic source.  Recommend ongoing stroke evaluation and aggressive risk factor modification.  She may need prolonged cardiac monitoring for paroxysmal A-fib.  Will need 30-day heart monitor at discharge from inpatient rehabilitation.  Message was sent to cardiology to arrange.  She is followed by Dr. Claiborne Billings in the outpatient setting. Aspirin AND Plavix for 3 weeks followed by aspirin alone. Vision impairment and right-sided weakness has improved   AKI on CKD IIIb Baseline creatinine seems to be around 1.4-1.6.  Creatinine has been increasing here in the hospital.  Likely multifactorial including hemodynamically mediated along with the fact that she received IV contrast during earlier part of this admission.   IV fluid rate was increased yesterday.  Pain has improved to 1.72 this morning.  We will stop IV fluids since now CT angiogram to be pursued only in the outpatient setting.  We will recheck labs tomorrow.  Renal ultrasound  no obstructive nephropathy UA unremarkable.  For some reason urine culture was also sent which is growing 50,000 colonies of E. coli.  UA was unremarkable.  This could be just  colonization.  Will not be treated. Good urine output noted.   Anemia of chronic disease No overt bleeding.  Hemoglobin has been stable.     Oral thrush Nystatin solution   Falls/FTT: Seen by PT recommend CIR once medically stable   Back pain with recent back surgery Underwent L4-L5 laminectomy and decompression of L4 and L5 nerve roots.  This was done on [place] 15 by Dr. Ellene Route.  Seems to be stable.  Occasional back pain is present.     Right sacral insufficiency fracture Incidentally noted on CT scan.  Defer to neurosurgery in the outpatient setting.  Neurological status is stable.  Biliary ductal dilatation Noted incidentally on CT scan.  LFTs unremarkable.  Outpatient follow-up   Patient is stable.  Okay for discharge to CIR.   PERTINENT LABS:  The results of significant diagnostics from this hospitalization (including imaging, microbiology, ancillary and laboratory) are listed below for reference.    Microbiology: Recent Results (from the past 240 hour(s))  MRSA Next Gen by PCR, Nasal     Status: None   Collection Time: 05/14/22 10:00 PM   Specimen: Nasal Mucosa; Nasal Swab  Result Value Ref Range Status   MRSA by PCR Next Gen NOT DETECTED NOT DETECTED Final    Comment: (NOTE) The GeneXpert MRSA Assay (FDA approved for NASAL specimens only), is one component of a comprehensive MRSA colonization surveillance program. It is not intended to diagnose MRSA infection nor to guide or monitor treatment for MRSA infections. Test performance is not FDA approved in patients less than 34 years old. Performed at [facility], [place] [address] [place], [place] [address]   Culture, blood (Routine X 2) w Reflex to ID Panel     Status: None   Collection Time: 05/15/22  9:15 AM   Specimen: BLOOD LEFT HAND  Result Value Ref Range Status   Specimen Description BLOOD LEFT HAND  Final   Special Requests   Final    BOTTLES DRAWN AEROBIC AND ANAEROBIC Blood Culture adequate volume    Culture   Final    NO GROWTH 5 DAYS Performed at [facility], [place] [address], [place], [place] [address]    Report Status 05/20/2022 FINAL  Final  Culture, blood (Routine X 2) w Reflex to ID Panel     Status: None   Collection Time: 05/15/22  9:23 AM   Specimen: BLOOD  Result Value Ref Range Status   Specimen Description BLOOD RIGHT ANTECUBITAL  Final   Special Requests   Final    BOTTLES DRAWN AEROBIC AND ANAEROBIC Blood Culture adequate volume   Culture   Final    NO GROWTH 5 DAYS Performed at [facility], [place] [address], [place], [place] [address]    Report Status 05/20/2022 FINAL  Final  Urine Culture     Status: Abnormal   Collection Time: 05/19/22  6:27 PM   Specimen: Urine, Clean Catch  Result Value Ref Range Status   Specimen Description URINE, CLEAN CATCH  Final   Special Requests   Final    NONE Performed at [facility], [place] [address] [place], [place] [address]    Culture 50,000 COLONIES/mL ESCHERICHIA COLI (A)  Final   Report Status 05/21/2022 FINAL  Final   Organism ID,  Bacteria ESCHERICHIA COLI (A)  Final      Susceptibility   Escherichia coli - MIC*    AMPICILLIN <=2 SENSITIVE Sensitive     CEFAZOLIN <=4 SENSITIVE Sensitive     CEFEPIME <=0.12 SENSITIVE Sensitive     CEFTRIAXONE <=0.25 SENSITIVE Sensitive     CIPROFLOXACIN <=0.25 SENSITIVE Sensitive     GENTAMICIN <=1 SENSITIVE Sensitive     IMIPENEM <=0.25 SENSITIVE Sensitive     NITROFURANTOIN <=16 SENSITIVE Sensitive     TRIMETH/SULFA <=20 SENSITIVE Sensitive     AMPICILLIN/SULBACTAM <=2 SENSITIVE Sensitive     PIP/TAZO <=4 SENSITIVE Sensitive     * 50,000 COLONIES/mL ESCHERICHIA COLI     Labs:   Basic Metabolic Panel: Recent Labs  Lab 05/17/22 0138 05/19/22 0050 05/20/22 0053 05/21/22 0036 05/22/22 0358  NA 133* 136 133* 133* 134*  K 4.4 3.9 4.0 3.8 3.7  CL 102 108 102 102 107  CO2 19* 21* 20* 20* 21*  GLUCOSE 110* 97 95 103* 94  BUN 27* 25* 28* 27* 19   CREATININE 1.98* 1.95* 2.00* 2.06* 1.72*  CALCIUM 8.5* 8.7* 8.7* 9.0 8.8*  MG 2.3  --   --   --   --    Liver Function Tests: Recent Labs  Lab 05/19/22 0050 05/22/22 0358  AST 25 22  ALT 15 18  ALKPHOS 106 106  BILITOT 0.5 0.4  PROT 6.2* 6.5  ALBUMIN 3.0* 3.1*    CBC: Recent Labs  Lab 05/16/22 0224 05/17/22 0138 05/19/22 0050 05/22/22 0358  WBC 8.6 6.5 6.9 8.8  NEUTROABS  --   --  3.9  --   HGB 9.3* 8.6* 9.0* 8.8*  HCT 28.2* 26.0* 27.4* 26.6*  MCV 97.6 97.7 98.9 96.7  PLT 376 350 311 271    CBG: Recent Labs  Lab 05/19/22 0014 05/19/22 0438 05/19/22 0808 05/19/22 1141 05/19/22 2052  GLUCAP 107* 99 119* 97 102*     IMAGING STUDIES US RENAL  Result Date: 05/18/2022 CLINICAL DATA:  [id number] increasing creatinine EXAM: RENAL / URINARY TRACT ULTRASOUND COMPLETE COMPARISON:  Ultrasound abdomen complete dated May 23, 2017 FINDINGS: Right Kidney: Renal measurements: 9.7 x 4.6 x 4.6 cm = volume: 110 mL. Echogenicity within normal limits. Mild thinning of the renal cortex. No mass or hydronephrosis visualized. Left Kidney: Renal measurements: 8.1 x 3.4 x 4.1 cm = volume: 59 mL. There is some thinning of the renal cortex seen. No mass or hydronephrosis visualized. Bladder: Appears normal for degree of bladder distention. Other: None. IMPRESSION: Size of the kidneys is within normal limits for the patient's age. There is some thinning of the renal cortices seen greater on the left. No mass or hydronephrosis seen. Electronically Signed   By: Frazier Richards M.D.   On: 05/18/2022 10:13   DG Chest Port 1 View  Result Date: 05/16/2022 CLINICAL DATA:  Dyspnea. EXAM: PORTABLE CHEST 1 VIEW COMPARISON:  September 22, 2018 FINDINGS: The heart size and mediastinal contours are within normal limits. There is marked severity calcification of the thoracic aorta. Both lungs are clear. A radiopaque fusion plate and screws are seen overlying the lower cervical spine. The visualized skeletal  structures are unremarkable. IMPRESSION: No active cardiopulmonary disease. Electronically Signed   By: Virgina Norfolk M.D.   On: 05/16/2022 19:28   ECHOCARDIOGRAM COMPLETE  Result Date: 05/16/2022    ECHOCARDIOGRAM REPORT   Patient Name:   JAYLENN BAIZA Date of Exam: 05/16/2022 Medical Rec #:  [id number]  Height:       63.0 in Accession #:    [id number]           Weight:       154.5 lb Date of Birth:  Jan 31, 1951            BSA:          1.733 m Patient Age:    15 years             BP:           108/47 mmHg Patient Gender: F                    HR:           51 bpm. Exam Location:  Inpatient Procedure: 2D Echo, Cardiac Doppler and Color Doppler Indications:    Stroke I63.9  History:        Patient has prior history of Echocardiogram examinations, most                 recent 10/14/2018. Previous Myocardial Infarction and CAD; Risk                 Factors:Hypertension and Dyslipidemia.  Sonographer:    Bernadene Person RDCS Referring Phys: [place]  1. Left ventricular ejection fraction, by estimation, is 60 to 65%. The left ventricle has normal function. The left ventricle has no regional wall motion abnormalities. There is mild concentric left ventricular hypertrophy. Left ventricular diastolic parameters are indeterminate.  2. Right ventricular systolic function is normal. The right ventricular size is normal. There is normal pulmonary artery systolic pressure.  3. Right atrial size was mildly dilated.  4. The mitral valve is normal in structure. Trivial mitral valve regurgitation. No evidence of mitral stenosis.  5. The aortic valve is normal in structure. Aortic valve regurgitation is not visualized. Aortic valve sclerosis is present, with no evidence of aortic valve stenosis.  6. The inferior vena cava is normal in size with greater than 50% respiratory variability, suggesting right atrial pressure of 3 mmHg. FINDINGS  Left Ventricle: Left ventricular ejection fraction, by  estimation, is 60 to 65%. The left ventricle has normal function. The left ventricle has no regional wall motion abnormalities. The left ventricular internal cavity size was normal in size. There is  mild concentric left ventricular hypertrophy. Left ventricular diastolic parameters are indeterminate. Right Ventricle: The right ventricular size is normal. No increase in right ventricular wall thickness. Right ventricular systolic function is normal. There is normal pulmonary artery systolic pressure. The tricuspid regurgitant velocity is 2.36 m/s, and  with an assumed right atrial pressure of 3 mmHg, the estimated right ventricular systolic pressure is 54.6 mmHg. Left Atrium: Left atrial size was normal in size. Right [place]: Right atrial size was mildly dilated. Pericardium: There is no evidence of pericardial effusion. Presence of epicardial fat layer. Mitral Valve: The mitral valve is normal in structure. Mild mitral annular calcification. Trivial mitral valve regurgitation. No evidence of mitral valve stenosis. Tricuspid Valve: The tricuspid valve is normal in structure. Tricuspid valve regurgitation is not demonstrated. No evidence of tricuspid stenosis. Aortic Valve: The aortic valve is normal in structure. Aortic valve regurgitation is not visualized. Aortic valve sclerosis is present, with no evidence of aortic valve stenosis. Pulmonic Valve: The pulmonic valve was normal in structure. Pulmonic valve regurgitation is not visualized. No evidence of pulmonic stenosis. Aorta: The aortic root is normal in size and structure. Venous: The  inferior vena cava is normal in size with greater than 50% respiratory variability, suggesting right atrial pressure of 3 mmHg. IAS/Shunts: The interatrial septum appears to be lipomatous. No atrial level shunt detected by color flow Doppler.  LEFT VENTRICLE PLAX 2D LVIDd:         5.00 cm      Diastology LVIDs:         3.10 cm      LV e' medial:    6.83 cm/s LV PW:         1.00  cm      LV E/e' medial:  11.5 LV IVS:        1.20 cm      LV e' lateral:   7.73 cm/s LVOT diam:     2.00 cm      LV E/e' lateral: 10.2 LV SV:         89 LV SV Index:   51 LVOT Area:     3.14 cm  LV Volumes (MOD) LV vol d, MOD A2C: 115.0 ml LV vol d, MOD A4C: 108.0 ml LV vol s, MOD A2C: 41.1 ml LV vol s, MOD A4C: 38.7 ml LV SV MOD A2C:     73.9 ml LV SV MOD A4C:     108.0 ml LV SV MOD BP:      72.7 ml RIGHT VENTRICLE RV S prime:     17.30 cm/s TAPSE (M-mode): 2.4 cm LEFT ATRIUM             Index        RIGHT ATRIUM           Index LA diam:        4.00 cm 2.31 cm/m   RA Area:     20.20 cm LA Vol (A2C):   49.1 ml 28.33 ml/m  RA Volume:   53.70 ml  30.99 ml/m LA Vol (A4C):   46.5 ml 26.83 ml/m LA Biplane Vol: 52.1 ml 30.06 ml/m  AORTIC VALVE LVOT Vmax:   110.00 cm/s LVOT Vmean:  73.900 cm/s LVOT VTI:    0.282 m  AORTA Ao Root diam: 3.20 cm Ao Asc diam:  3.60 cm MITRAL VALVE                TRICUSPID VALVE MV Area (PHT): 2.77 cm     TR Peak grad:   22.3 mmHg MV Decel Time: 274 msec     TR Vmax:        236.00 cm/s MV E velocity: 78.60 cm/s MV A velocity: 101.00 cm/s  SHUNTS MV E/A ratio:  0.78         Systemic VTI:  0.28 m                             Systemic Diam: 2.00 cm Kardie Tobb DO Electronically signed by Berniece Salines DO Signature Date/Time: 05/16/2022/12:25:38 PM    Final    CT ANGIO HEAD W OR WO CONTRAST  Result Date: 05/15/2022 CLINICAL DATA:  Stroke follow-up EXAM: CT ANGIOGRAPHY HEAD TECHNIQUE: Multidetector CT imaging of the head was performed using the standard protocol during bolus administration of intravenous contrast. Multiplanar CT image reconstructions and MIPs were obtained to evaluate the vascular anatomy. RADIATION DOSE REDUCTION: This exam was performed according to the departmental dose-optimization program which includes automated exposure control, adjustment of the mA and/or kV according to patient size and/or use of iterative reconstruction technique. CONTRAST:  75m OMNIPAQUE IOHEXOL  350 MG/ML SOLN COMPARISON:  Brain MRI obtained earlier the same day FINDINGS: CT HEAD Brain: Again seen is evolving acute to subacute infarct in the left occipital lobe. There is no evidence of hemorrhagic transformation. There is no evidence of new acute infarct. There is no acute intracranial hemorrhage or extra-axial fluid collection. Remote lacunar infarcts in the bilateral basal ganglia and thalami and background advanced chronic white matter microangiopathy are unchanged. The ventricles are stable in size. There is no mass lesion.  There is no mass effect or midline shift. Vascular: See below. Skull: Normal. Negative for fracture or focal lesion. Sinuses: Paranasal sinuses are clear. Other: Globes and orbits are unremarkable. CTA HEAD Anterior circulation: The imaged high cervical internal carotid arteries are patent. Intracranial internal carotid arteries are patent with scattered calcified plaque but no hemodynamically significant stenosis or occlusion. The bilateral MCAs are patent with mild atherosclerotic irregularity distally but no proximal high-grade stenosis or occlusion. The bilateral ACAs are patent without proximal high-grade stenosis or occlusion. The anterior communicating artery is normal. There is a 3 mm x 2 mm superiorly projecting aneurysm arising from the left paraclinoid ICA (12-74). There is an additional 1 mm inferiorly projecting outpouching arising from the communicating segment of the ICA which could reflect an additional tiny aneurysm (12-83). There is a 3 mm posterior inferiorly projecting outpouching arising from the right supraclinoid ICA suspicious for small aneurysm (13-84, 12-86). Posterior circulation: The bilateral V4 segments are patent. The left PICA origin is identified. The right PICA origin is not seen. The basilar artery is patent. The bilateral PCAs are patent with atherosclerotic irregularity distally but no proximal high-grade stenosis or occlusion. There is no  aneurysm or AVM. Venous sinuses: Not well evaluated due to bolus timing. Anatomic variants: None. Review of the MIP images confirms the above findings. IMPRESSION: 1. Evolving infarct in the left occipital lobe without evidence of hemorrhagic transformation. No new acute intracranial pathology. 2. Patent intracranial vasculature with mild atherosclerotic irregularity of the distal branches but no proximal high-grade stenosis or occlusion. 3. 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 Electronically Signed   By: PValetta MoleM.D.   On: 05/15/2022 16:42   CT [place] VASC ACCESS RT NO REPORT  Result Date: 05/15/2022 There is no Radiologist interpretation  for this exam.  MR BRAIN WO CONTRAST  Result Date: 05/15/2022 CLINICAL DATA:  71year old female status post fall, head trauma. Possible small acute cortical infarct on CT. EXAM: MRI HEAD WITHOUT CONTRAST TECHNIQUE: Multiplanar, multiecho pulse sequences of the brain and surrounding structures were obtained without intravenous contrast. COMPARISON:  Head and cervical spine CT yesterday. FINDINGS: Brain: Restricted diffusion in the left superior occipital lobe along the parieto-occipital sulcus corresponding to the CT finding yesterday. Only mild associated T2 and FLAIR hyperintense cytotoxic edema. No hemorrhage or mass effect. No other No restricted diffusion or evidence of acute infarction. There is a chronic microhemorrhage in the right occipital pole on SWI (series 14, image 25). Subtle chronic linear infarct in the left cerebellum series 10, image 7. And numerous chronic lacunar infarcts scattered in the bilateral deep gray nuclei. No other chronic cerebral blood products. Additional bilateral cerebral white matter T2 and FLAIR hyperintensity. No midline shift, mass effect, evidence of mass  lesion, ventriculomegaly, extra-axial collection or acute intracranial hemorrhage. Cervicomedullary  junction and pituitary are within normal limits. Patchy and scattered Vascular: Major intracranial vascular flow voids are preserved, with some generalized intracranial artery dolichoectasia. Skull and upper cervical spine: Partially visible cervical ACDF hardware. Otherwise negative. Visualized bone marrow signal is within normal limits. Sinuses/Orbits: Negative orbits. Paranasal sinuses and mastoids are stable and well aerated. Other: Visible internal auditory structures appear normal. Negative visible scalp and face. IMPRESSION: 1. Confirmed acute to subacute Left PCA territory infarct in the left occipital lobe. No associated hemorrhage or mass effect. 2. Underlying Advanced chronic small vessel disease. No other acute intracranial abnormality. Electronically Signed   By: Genevie Ann M.D.   On: 05/15/2022 11:51   CT CHEST ABDOMEN PELVIS W CONTRAST  Addendum Date: 05/14/2022   ADDENDUM REPORT: 05/14/2022 14:45 ADDENDUM: Based on the location of the pseudoaneurysm is near the ligamentum arteriosum the possibility of acute injury to a pre-existing pseudoaneurysm is considered and acute aortic injury is possible. For this reason thoracic or vascular surgery evaluation is suggested as outlined in the initial report. These results were called by telephone at the time of interpretation on 05/14/2022 at 2:45 pm to provider Dr. Roderic Palau, Who verbally acknowledged these results. Dr. Roderic Palau also to the initial call for this patient as outlined in the initial report. Electronically Signed   By: Zetta Bills M.D.   On: 05/14/2022 14:45   Result Date: 05/14/2022 CLINICAL DATA:  A 71 year old female presents for evaluation of trauma post fall. EXAM: CT CHEST, ABDOMEN, AND PELVIS WITH CONTRAST TECHNIQUE: Multidetector CT imaging of the chest, abdomen and pelvis was performed following the standard protocol during bolus  administration of intravenous contrast. RADIATION DOSE REDUCTION: This exam was performed according to the departmental dose-optimization program which includes automated exposure control, adjustment of the mA and/or kV according to patient size and/or use of iterative reconstruction technique. CONTRAST:  35m OMNIPAQUE IOHEXOL 350 MG/ML SOLN COMPARISON:  Previous imaging of the abdomen and pelvis. No dedicated imaging of the chest is available for comparison aside from previous cardiac imaging which was performed in December of 2019. FINDINGS: CT CHEST FINDINGS Cardiovascular: Calcified and noncalcified atheromatous plaque in the thoracic aorta. Focal outpouching from the aortic arch with subtle surrounding stranding, extending beyond confines of the thoracic aorta measuring 1.3 x 2.0 cm and without prior for comparison. Abundant calcified and noncalcified plaque with areas of marked plaque irregularity grossly similar to previous imaging with respect to imaged portions of the aorta. Second area of suspected penetrating atherosclerotic ulcer or developing pseudoaneurysm noted on image 36/3 but without surrounding stranding. Also seen on a image 59 of series 3 along the superior aspect of the thoracic aorta is a similar focal outpouching also without surrounding stranding. No signs of aortic dissection or additional suspected or potential acute process. Central pulmonary vessels are normal caliber. Mediastinum/Nodes: Mild stranding about the suspected pseudoaneurysm/penetrating ulcer that extends from the LEFT lateral aspect of the thoracic aorta. No thoracic inlet lymphadenopathy. No axillary lymphadenopathy. No mediastinal lymphadenopathy. No hilar lymphadenopathy. Esophagus is grossly normal. Lungs/Pleura: No consolidation. No pleural effusion. No pneumothorax. PICC PICC basilar atelectasis. Airways are patent. Musculoskeletal: No contusion over the body wall. See dedicated musculoskeletal section below for  further detail. CT ABDOMEN PELVIS FINDINGS Hepatobiliary: Increasing biliary duct distension since previous imaging from 2021 both intra and extrahepatic biliary duct distension. Transition is at the level of the ampulla. No discrete abnormality is noted to explain these findings. Common bile duct is 14 mm greatest axial dimension which is similar  though intrahepatic biliary duct distension is moderate and increased since previous imaging. Pancreas: Pancreatic atrophy without inflammation or visible lesion. No ductal dilation. Spleen: Normal. Adrenals/Urinary Tract: Adrenal glands are normal. Normal RIGHT kidney. Atrophy of the upper pole the LEFT kidney. No hydronephrosis or suspicious renal lesion. Smooth contour the urinary bladder. Stomach/Bowel: Query thickening of the ascending colon. No adjacent stranding. Sigmoid diverticulosis and diverticular changes without adjacent stranding. Appendix not visualized, no secondary signs to suggest acute appendicitis. No acute gastric or small bowel process. Vascular/Lymphatic: Aortic atherosclerosis both calcified and noncalcified in the abdominal aorta. No surrounding stranding. No aneurysmal dilation of the abdominal aorta. Reproductive: Post hysterectomy.  No adnexal masses. Other: No ascites. Musculoskeletal: Sclerosis along the RIGHT hemi sacrum. This shows a sub chondral bandlike distribution and was not present on previous imaging. The sacroiliac joints are intact. Symphysis pubis is intact. No sign of displaced fracture. No sign of displaced rib fracture.  Sternum is intact. Signs of L4-5 spinal fusion with grade 1 anterolisthesis of L4 on L5. Anterolisthesis new from imaging in 2021 and perhaps mildly increased compared to imaging from December of 2022, not changed compared to recent postoperative imaging and associated with interbody fusion. Small amount of fluid in the subcutaneous fat overlying the surgical site (image 84/3) 3 x 2.2 cm. The IMPRESSION: 1.  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 Based on appearance thoracic or vascular surgery consultation is suggested particularly if there is chest pain to exclude acute aortic syndrome relating to pseudoaneurysm. Etiology of this finding may relate to underlying penetrating atherosclerotic ulcer based on morphology and presence of similar smaller areas that are seen in the aorta without surrounding stranding. 2. 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 3. Irregular and abundant soft plaque in the thoracic and abdominal aorta could be a risk for distal embolization. 4. Thickening of the ascending and transverse colon may reflect findings of colitis other previous or mild to moderate current colitis. There is no adjacent stranding or free fluid in the pelvis. Correlate with severity of abdominal trauma and consider repeat imaging if there is worsening of symptoms. Would also correlate with any symptoms of colitis or recent colitis. 5. Findings of RIGHT sacral insufficiency fracture. 6. Recent postoperative changes related to lumbar spinal fusion. Small nonspecific fluid in the subcutaneous fat. This potentially represents seroma should be correlated with any worsening symptoms in this location or signs infection. 7. Increasing biliary duct distension now with moderate intrahepatic biliary duct distension in similar common bile duct distension without clear cause. Correlate with any symptoms of biliary obstruction or RIGHT upper quadrant pain with further imaging on follow-up as warranted. These results were called by telephone at the time of interpretation on 05/14/2022 at 2:26 pm to provider [facility] , who verbally acknowledged these results. Electronically Signed: By: Zetta Bills M.D. On: 05/14/2022 14:26    CT T-SPINE NO CHARGE  Result Date: 05/14/2022 CLINICAL DATA:  Left leg pain after fall. Recent lower back surgery 2 weeks ago. EXAM: CT THORACIC AND LUMBAR SPINE WITHOUT CONTRAST TECHNIQUE: Multidetector CT imaging of the thoracic and lumbar spine was performed without intravenous contrast. Multiplanar CT image reconstructions were also generated. RADIATION DOSE REDUCTION: This exam was performed according to the departmental dose-optimization program which includes automated exposure control, adjustment of the mA and/or kV according to  patient size and/or use of iterative reconstruction technique. COMPARISON:  Lumbar spine x-rays dated [place] 30, 2023. CT lumbar myelogram dated October 25, 2021. FINDINGS: CT THORACIC SPINE FINDINGS Alignment: Slight dextrocurvature.  No significant listhesis. Vertebrae: No acute fracture or focal pathologic process. Paraspinal and other soft tissues: Please see separate CT chest report from same day. Disc levels: Disc heights are preserved. Scattered facet arthropathy. No significant spinal canal or neuroforaminal stenosis. CT LUMBAR SPINE FINDINGS Segmentation: 5 lumbar type vertebrae. Alignment: No traumatic malalignment. Unchanged 8 mm anterolisthesis at L4-L5. Vertebrae: L4-L5 PLIF. No evidence of hardware failure or loosening. No acute fracture or focal pathologic process. Paraspinal and other soft tissues: Subacute appearing fracture of the right sacral ala with surrounding sclerosis, new since December. Disc levels: Unchanged mild disc bulging at L3-L4 and moderate disc bulging at L5-S1. L4-L5 PLIF with bilateral laminectomies. Unchanged moderate to severe right and mild left facet arthropathy at L5-S1. No significant spinal canal or neuroforaminal stenosis at any level. IMPRESSION: 1. No acute osseous abnormality of the thoracic or lumbar spine. 2. Subacute appearing fracture of the right sacral ala with surrounding sclerosis, new since December. 3. L4-L5 PLIF without  evidence of hardware complication. Electronically Signed   By: Titus Dubin M.D.   On: 05/14/2022 14:06   CT L-SPINE NO CHARGE  Result Date: 05/14/2022 CLINICAL DATA:  Left leg pain after fall. Recent lower back surgery 2 weeks ago. EXAM: CT THORACIC AND LUMBAR SPINE WITHOUT CONTRAST TECHNIQUE: Multidetector CT imaging of the thoracic and lumbar spine was performed without intravenous contrast. Multiplanar CT image reconstructions were also generated. RADIATION DOSE REDUCTION: This exam was performed according to the departmental dose-optimization program which includes automated exposure control, adjustment of the mA and/or kV according to patient size and/or use of iterative reconstruction technique. COMPARISON:  Lumbar spine x-rays dated [place] 30, 2023. CT lumbar myelogram dated October 25, 2021. FINDINGS: CT THORACIC SPINE FINDINGS Alignment: Slight dextrocurvature.  No significant listhesis. Vertebrae: No acute fracture or focal pathologic process. Paraspinal and other soft tissues: Please see separate CT chest report from same day. Disc levels: Disc heights are preserved. Scattered facet arthropathy. No significant spinal canal or neuroforaminal stenosis. CT LUMBAR SPINE FINDINGS Segmentation: 5 lumbar type vertebrae. Alignment: No traumatic malalignment. Unchanged 8 mm anterolisthesis at L4-L5. Vertebrae: L4-L5 PLIF. No evidence of hardware failure or loosening. No acute fracture or focal pathologic process. Paraspinal and other soft tissues: Subacute appearing fracture of the right sacral ala with surrounding sclerosis, new since December. Disc levels: Unchanged mild disc bulging at L3-L4 and moderate disc bulging at L5-S1. L4-L5 PLIF with bilateral laminectomies. Unchanged moderate to severe right and mild left facet arthropathy at L5-S1. No significant spinal canal or neuroforaminal stenosis at any level. IMPRESSION: 1. No acute osseous abnormality of the thoracic or lumbar spine. 2. Subacute  appearing fracture of the right sacral ala with surrounding sclerosis, new since December. 3. L4-L5 PLIF without evidence of hardware complication. Electronically Signed   By: Titus Dubin M.D.   On: 05/14/2022 14:06   CT CERVICAL SPINE WO CONTRAST  Result Date: 05/14/2022 CLINICAL DATA:  Poly trauma, blunt.  Fell on the porch last night. EXAM: CT CERVICAL SPINE WITHOUT CONTRAST TECHNIQUE: Multidetector CT imaging of the cervical spine was performed without intravenous contrast. Multiplanar CT image reconstructions were also generated. RADIATION DOSE REDUCTION: This exam was performed according to the departmental dose-optimization program which includes automated exposure control, adjustment of the mA and/or kV according to patient size and/or  use of iterative reconstruction technique. COMPARISON:  None FINDINGS: Alignment: Straightening of the normal cervical lordosis. Degenerative type anterolisthesis at C4-5 of 2 mm. Skull base and vertebrae: Distant ACDF C5-C7. No evidence of regional fracture. Soft tissues and spinal canal: No traumatic soft tissue finding. Disc levels: Ordinary osteoarthritis at the C1-2 articulation. C2-3 facet osteoarthritis without significant stenosis. C3-4 facet osteoarthritis and small uncovertebral osteophytes. Foraminal narrowing on the right that could possibly be symptomatic. C4-5 facet arthropathy with 2 mm of degenerative anterolisthesis. Bony foraminal narrowing on the right that could possibly be symptomatic. Solid union from C5 through C7 with wide patency of the canal and foramina. Some chronic bony foraminal narrowing at C5-6, not likely compressive. No significant C7-T1 finding. Upper chest: Negative Other: None IMPRESSION: No acute or traumatic finding. Chronic ACDF C5 through C7 with solid union and sufficient patency of the canal and foramina at those levels. C4-5 facet arthropathy with 2 mm of degenerative anterolisthesis. Bony foraminal narrowing on the right at  C3-4 and C4-5. Chronic bony foraminal narrowing at C5-6. Electronically Signed   By: Nelson Chimes M.D.   On: 05/14/2022 13:53   CT HEAD WO CONTRAST  Result Date: 05/14/2022 CLINICAL DATA:  Head trauma, moderate-severe. Fell on the porch last night EXAM: CT HEAD WITHOUT CONTRAST TECHNIQUE: Contiguous axial images were obtained from the base of the skull through the vertex without intravenous contrast. RADIATION DOSE REDUCTION: This exam was performed according to the departmental dose-optimization program which includes automated exposure control, adjustment of the mA and/or kV according to patient size and/or use of iterative reconstruction technique. COMPARISON:  09/12/2018 FINDINGS: Brain: No focal abnormality seen affecting the brainstem or cerebellum. Cerebral hemispheres show chronic small-vessel ischemic changes throughout the white matter, progressive since 2019. Old appearing small vessel infarctions of the right putamen and left caudate. Question acute/subacute infarction of the medial left parietooccipital junction. No hemorrhage, hydrocephalus or extra-axial collection. Vascular: There is atherosclerotic calcification of the major vessels at the base of the brain. Skull: Negative Sinuses/Orbits: Clear/normal Other: None IMPRESSION: No acute traumatic finding. Chronic small-vessel ischemic changes of the cerebral hemispheric white matter and basal ganglia, progressive since 2019. Suspicion of small acute cortical infarction in the medial left parietooccipital junction region without evidence of mass effect or hemorrhage. Consider confirmation with MRI. Electronically Signed   By: Nelson Chimes M.D.   On: 05/14/2022 13:48   DG Hip Unilat W or Wo Pelvis 2-3 Views Right  Result Date: 05/14/2022 CLINICAL DATA:  Fall, left lower extremity pain EXAM: DG HIP (WITH OR WITHOUT PELVIS) 2-3V RIGHT COMPARISON:  None Available. FINDINGS: No right hip fracture or dislocation. Small superior right greater trochanter  enthesophytes. No significant right hip arthropathy. No suspicious focal osseous lesions. No radiopaque foreign bodies. IMPRESSION: No right hip fracture or dislocation. Electronically Signed   By: Ilona Sorrel M.D.   On: 05/14/2022 12:02   DG Hip Unilat W or Wo Pelvis 2-3 Views Left  Result Date: 05/14/2022 CLINICAL DATA:  Fall, left lower extremity pain EXAM: DG HIP (WITH OR WITHOUT PELVIS) 2-3V LEFT COMPARISON:  None Available. FINDINGS: No pelvic fracture or diastasis. No left hip fracture or dislocation. No suspicious focal osseous lesions. Bilateral posterior spinal fusion hardware at L4-5. No significant left hip arthropathy. IMPRESSION: No left hip fracture or malalignment. Electronically Signed   By: Ilona Sorrel M.D.   On: 05/14/2022 12:01     DISCHARGE EXAMINATION: Vitals:   05/22/22 5784 05/22/22 0524 05/22/22 0810 05/22/22 1137  BP:  (!) 157/57 (!) 153/59 (!) 139/48  Pulse:   (!) 56 (!) 57  Resp:  '14 16 15  '$ Temp:   98 F (36.7 C) 98.2 F (36.8 C)  TempSrc:   Oral Oral  SpO2:   98% 96%  Weight: 71 kg     Height:       See progress note from earlier today.   DISPOSITION: CIR  Discharge Instructions     AMB Referral to Advanced Lipid Disorders Clinic   Complete by: As directed    Internal Lipid Clinic Referral Scheduling  Internal lipid clinic referrals are providers within [facility], who wish to refer established patients for routine management (help in starting PCSK9 inhibitor therapy) or advanced therapies.  Internal MD referral criteria:              1. All patients with LDL>190 mg/dL  2. All patients with Triglycerides >500 mg/dL  3. Patients with suspected or confirmed heterozygous familial hyperlipidemia (HeFH) or homozygous familial hyperlipidemia (HoFH)  4. Patients with family history of suspicious for genetic dyslipidemia desiring genetic testing  5. Patients refractory to standard guideline based therapy  6. Patients with statin intolerance (failed 2 statins,  one of which must be a high potency statin)  7. Patients who the provider desires to be seen by MD   Internal PharmD referral criteria:   1. Follow-up patients for medication management  2. Follow-up for compliance monitoring  3. Patients for drug education  4. Patients with statin intolerance  5. PCSK9 inhibitor education and prior authorization approvals  6. Patients with triglycerides <500 mg/dL  External Lipid Clinic Referral  External lipid clinic referrals are for providers outside of [place], considered new clinic patients - automatically routed to MD schedule   Ambulatory referral to Neurology   Complete by: As directed    An appointment is requested in approximately: 4 weeks for CVA   Call MD for:  difficulty breathing, headache or visual disturbances   Complete by: As directed    Call MD for:  extreme fatigue   Complete by: As directed    Call MD for:  persistant dizziness or light-headedness   Complete by: As directed    Call MD for:  persistant nausea and vomiting   Complete by: As directed    Call MD for:  severe uncontrolled pain   Complete by: As directed    Call MD for:  temperature >100.4   Complete by: As directed    Diet - low sodium heart healthy   Complete by: As directed    Discharge instructions   Complete by: As directed    Please take your medications as prescribed.  Please be sure to follow-up with Dr. Ellene Route.  A message has been sent to your cardiologist to arrange a 30-day heart monitor.  We will send a referral for you to see the neurologist as well.  Vascular surgery will arrange outpatient follow-up for you.  You were cared for by a hospitalist during your hospital stay. If you have any questions about your discharge medications or the care you received while you were in the hospital after you are discharged, you can call the unit and asked to speak with the hospitalist on call if the hospitalist that took care of you is not available. Once  you are discharged, your primary care physician will handle any further medical issues. Please note that NO REFILLS for any discharge medications will be authorized once you are discharged,  as it is imperative that you return to your primary care physician (or establish a relationship with a primary care physician if you do not have one) for your aftercare needs so that they can reassess your need for medications and monitor your lab values. If you do not have a primary care physician, you can call [phone] for a physician referral.   Increase activity slowly   Complete by: As directed    No wound care   Complete by: As directed         Current Inpatient Medications: Scheduled:  sodium chloride   Intravenous Once   amLODipine  10 mg Oral Daily   aspirin  81 mg Oral Daily   atorvastatin  40 mg Oral Daily   chlorthalidone  25 mg Oral Daily   cloNIDine  0.1 mg Oral QHS   clopidogrel  75 mg Oral Daily   docusate sodium  100 mg Oral Daily   ezetimibe  10 mg Oral Daily   heparin injection (subcutaneous)  5,000 Units Subcutaneous Q8H   hydrALAZINE  100 mg Oral TID   nystatin  5 mL Oral QID   polyethylene glycol  17 g Oral Daily   senna  1 tablet Oral QHS   topiramate  25 mg Oral BID   Continuous: [id number], butalbital-acetaminophen-caffeine, hydrALAZINE, lip balm, methocarbamol, ondansetron (ZOFRAN) IV, mouth rinse, oxyCODONE-acetaminophen, sodium phosphate       Follow-up Information     Sandi Mariscal, MD Follow up in 1 week(s).   Specialty: Internal Medicine Contact information: [place] [address] [phone]         Kristeen Miss, MD Follow up in 3 week(s).   Specialty: Neurosurgery Contact information: [address] [place] [address] [place] [address] [phone]                 TOTAL DISCHARGE TIME: 35 mins  [employer] operational officer on [email or web address]  05/22/2022, 2:03 PM

## 2022-05-22 NOTE — H&P (Signed)
Physical Medicine and Rehabilitation Admission H&P    CC: Functional deficits secondary to acute left PCA infarct 7/3  HPI: Robin Arellano is a 71 year old female who presented to the emergency department on 05/14/2022 after sustaining a fall at approximately 2 AM.  She had recently undergone back surgery and had taken a Robaxin for left leg pain.  She had stopped aspirin therapy prior to surgery and had not restarted it.  She fell backwards and denied loss of consciousness, headache or dizziness.  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  Cardiothoracic surgery consulted.  Admitted to ICU and started on esmolol for hypertensive emergency.  Vascular surgery was also consulted to evaluate her descending thoracic aortic disease.  She was seen by Dr. Stanford Breed on 7/4 with plans to repeat CT angiogram of chest abdomen and pelvis in 48 hours.  This was delayed due to acute kidney injury.  Arrangements made to perform this as outpatient.  No surgical intervention advised during hospitalization.  CT of the head revealed suspicious acute left infarct and MRI of the brain confirmed an acute/subacute left PCA infarct and neurology was consulted for stroke work-up.  Plavix and aspirin started on 7/5 for 3 weeks then aspirin alone.  She is tolerating heart healthy diet. The patient requires inpatient physical medicine and rehabilitation evaluations and treatment secondary to dysfunction due to all deficits secondary to acute left PCA infarct and recent back surgery.  Pt c/o abd pain- thinks from heparin shots vs referred from back.  Oral thrush has resolved- Comes and goes.  CT angiogram will be outpt.  Voiding OK.  LBM this AM- no constipation.   The patient is status post bilateral laminectomy L4-L5 decompression of L4 and L5 nerve roots secondary to spondylolisthesis of L4-L5 with L4  radiculopathy bilaterally on 04/26/2022 by Dr. Ellene Route.  Review of Systems  Constitutional:  Negative for chills and fever.  HENT:  Negative for hearing loss and sore throat.   Eyes:  Negative for blurred vision and double vision.  Respiratory:  Negative for cough and shortness of breath.   Cardiovascular:  Negative for chest pain and palpitations.  Gastrointestinal:  Positive for abdominal pain. Negative for constipation, nausea and vomiting.       Says her abdomen is painful today and attributes it to heparin injections  Genitourinary:  Negative for dysuria and urgency.  Musculoskeletal:        Both feet are uncomfortable: numb and heavy  Skin:        Complains of multiple bruises  Neurological:  Negative for dizziness and headaches.  All other systems reviewed and are negative.  Past Medical History:  Diagnosis Date   Anxiety    on meds   Back pain    Chronic female pelvic pain    Coronary artery disease    mild, non-obstructive 11/2018   Depression    on meds   Family history of adverse reaction to anesthesia    sister had difficulty waking up   Fibromyalgia    H/O leukocytosis    Headache    Hyperlipidemia    on meds   Hypertension    on meds   MI (myocardial infarction) ([place])    Pt states she did not have a MI- EKG was normal, was GERD   Osteoarthritis    on meds   Ovarian cyst, right  PONV (postoperative nausea and vomiting)    Post-operative nausea and vomiting    SVD (spontaneous vaginal delivery)    x 2   Vitamin D deficiency    Past Surgical History:  Procedure Laterality Date   ABDOMINAL HYSTERECTOMY  1994   TAH.BSO   ANTERIOR CERVICAL DECOMP/DISCECTOMY FUSION  2019   APPENDECTOMY  1975   BACK SURGERY  2023   BIOPSY  05/12/2020   Procedure: BIOPSY;  Surgeon: Milus Banister, MD;  Location: [facility];  Service: Endoscopy;;   CARDIAC CATHETERIZATION  2020   CHOLECYSTECTOMY N/A 07/21/2020   Procedure: LAPAROSCOPIC CHOLECYSTECTOMY WITH  INTRAOPERATIVE CHOLANGIOGRAM;  Surgeon: [name], MD;  Location: [facility];  Service: General;  Laterality: N/A;   COLONOSCOPY  08/12/2017   Hx TA (piecemeal)Jacobs-MAC-suprep (good)   ESOPHAGOGASTRODUODENOSCOPY (EGD) WITH PROPOFOL N/A 05/12/2020   Procedure: ESOPHAGOGASTRODUODENOSCOPY (EGD) WITH PROPOFOL;  Surgeon: Milus Banister, MD;  Location: [facility];  Service: Endoscopy;  Laterality: N/A;   EUS N/A 05/12/2020   Procedure: UPPER ENDOSCOPIC ULTRASOUND (EUS) RADIAL;  Surgeon: Milus Banister, MD;  Location: [facility];  Service: Endoscopy;  Laterality: N/A;   KNEE SURGERY Bilateral 1996   x 2 - arthroscopic   LEFT HEART CATH AND CORONARY ANGIOGRAPHY N/A 11/25/2018   Procedure: LEFT HEART CATH AND CORONARY ANGIOGRAPHY;  Surgeon: Troy Sine, MD;  Location: [facility];  Service: Cardiovascular;  Laterality: N/A;   PELVIC LAPAROSCOPY  1989   W LYSIS OF ADHESIONS/L SALPINGONEOSTOMY   TUBAL LIGATION     WISDOM TOOTH EXTRACTION     Family History  Problem Relation Age of Onset   Uterine cancer Mother 39   Aneurysm Father    Heart disease Paternal Grandfather    COPD Brother    Skin cancer Brother    Other Sister        MGUS    Skin cancer Brother    Other Sister        MA   Colon cancer Neg Hx    Rectal cancer Neg Hx    Stomach cancer Neg Hx    Stroke Neg Hx    Neuropathy Neg Hx    Colon polyps Neg Hx    Esophageal cancer Neg Hx    Social History:  reports that she quit smoking about 23 years ago. Her smoking use included cigarettes. She has a 3.00 pack-year smoking history. She has never used smokeless tobacco. She reports that she does not currently use alcohol. She reports that she does not use drugs. Allergies:  Allergies  Allergen Reactions   Flexeril [Cyclobenzaprine] Anaphylaxis, Hives, Itching and Swelling   Lidocaine Hives   Zanaflex [Tizanidine] Anaphylaxis, Hives, Itching and Swelling   Latex Rash   Medications Prior to Admission  Medication  Sig Dispense Refill   ALPRAZolam (XANAX) 1 MG tablet Take 0.5 mg by mouth daily as needed for sleep or anxiety.     amLODipine (NORVASC) 10 MG tablet TAKE 1 TABLET(10 MG) BY MOUTH DAILY (Patient taking differently: Take 10 mg by mouth daily.) 90 tablet 3   Ascorbic Acid (VITAMIN C) 1000 MG tablet Take 1,000 mg by mouth in the morning.     aspirin EC 81 MG tablet Take 81 mg by mouth as needed (chest pain). Swallow whole.     Calcium Carbonate Antacid (TUMS PO) Take 2 tablets by mouth daily as needed (stomach pain).     Cholecalciferol (VITAMIN D) 50 MCG (2000 UT) tablet Take 2,000 Units by mouth  in the morning.     ezetimibe (ZETIA) 10 MG tablet TAKE 1 TABLET(10 MG) BY MOUTH DAILY (Patient taking differently: Take 10 mg by mouth daily.) 90 tablet 3   hydrALAZINE (APRESOLINE) 100 MG tablet Take 1 tablet (100 mg total) by mouth 3 (three) times daily. (Patient taking differently: Take 100 mg by mouth in the morning.) 270 tablet 1   methocarbamol (ROBAXIN) 500 MG tablet Take 1 tablet (500 mg total) by mouth every 6 (six) hours as needed for muscle spasms. 30 tablet 3   methylPREDNISolone (MEDROL DOSEPAK) 4 MG TBPK tablet Take 4 mg by mouth in the morning, at noon, in the evening, and at bedtime.     olmesartan (BENICAR) 40 MG tablet Take 1 tablet (40 mg total) by mouth daily. (Patient taking differently: Take 20 mg by mouth in the morning.) 90 tablet 3   valACYclovir (VALTREX) 500 MG tablet TAKE 1 TABLET BY MOUTH TWICE DAILY FOR 3 TO 5 DAYS THEN TAKE DAILY AS NEEDED (Patient taking differently: Take 500 mg by mouth 2 (two) times daily as needed (shingles flair up).) 30 tablet 0   [DISCONTINUED] oxyCODONE-acetaminophen (PERCOCET/ROXICET) 5-325 MG tablet Take 1-2 tablets by mouth every 4 (four) hours as needed for moderate pain or severe pain. (Patient taking differently: Take 1 tablet by mouth every 6 (six) hours as needed for moderate pain or severe pain.) 60 tablet 0   HYDROcodone-acetaminophen (NORCO)  10-325 MG tablet Take 1 tablet by mouth 3 (three) times daily as needed for moderate pain. (Patient not taking: Reported on 05/14/2022)     zolpidem (AMBIEN) 10 MG tablet Take 10 mg by mouth at bedtime as needed for sleep.  (Patient not taking: Reported on 05/14/2022)        Home: Home Living Family/patient expects to be discharged to:: Private residence Living Arrangements: Spouse/significant other, Children Available Help at Discharge: Family, Available 24 hours/day Type of Home: House Home Access: Stairs to enter [employer] of Steps: 5 Entrance Stairs-Rails: Right, Left Home Layout: One level Bathroom Shower/Tub: Multimedia programmer: Standard Bathroom Accessibility: Yes Home Equipment: BSC/3in1, Shower seat, Grab bars - toilet, Grab bars - tub/shower, Toilet riser, Conservation officer, nature (2 wheels), [employer] - single point Additional Comments: pt reports daughter and spouse live at home, sister can come assist as needed  Lives With: Spouse, Daughter   Functional History: Prior Function Prior Level of Function : Needs assist, History of Falls (last six months) Physical Assist : Mobility (physical), ADLs (physical) Mobility (physical): Transfers, Gait, Stairs ADLs (physical): Bathing, Dressing, Toileting, IADLs Mobility Comments: uses RW at all times, no longer uses cane due to falls ADLs Comments: pt reports daughter or sister assist with ADLs as needed  Functional Status:  Mobility: Bed Mobility Overal bed mobility: Needs Assistance Bed Mobility: Sidelying to Sit, Sit to Supine Rolling: Min guard Sidelying to sit: Min assist Sit to supine: Min guard Sit to sidelying: Min assist General bed mobility comments: Verbal cues to perform rolling and sidelying to sit for back precautions. Assist to elevate trunk into sitting. Returning to supine pt does not follow back precautions and is in supine before I was able to cue her to go to sidelying Transfers Overall transfer  level: Needs assistance Equipment used: Rolling walker (2 wheels), None Transfers: Sit to/from Stand, Bed to chair/wheelchair/BSC Sit to Stand: Min assist Bed to/from chair/wheelchair/BSC transfer type:: Step pivot Step pivot transfers: Min assist General transfer comment: Assist to power up and for balance. Bed to bsc  to bed with assist for balance. Ambulation/Gait Ambulation/Gait assistance: Min assist Gait Distance (Feet): 140 Feet Assistive device: Rolling walker (2 wheels) Gait Pattern/deviations: Step-to pattern, Decreased step length - right, Decreased step length - left, Shuffle, Trunk flexed, Narrow base of support General Gait Details: Assist for balance and for inattention to rt. Pt ran walker into objects x 5 on rt. Verbal cues to incr step length with pt responding for 2-3 steps and then back to very short step length. Worked on taking 2-3 steps forward and backward without walker and with hand held to work on increasing step length. Gait velocity: decr Gait velocity interpretation: <1.8 ft/sec, indicate of risk for recurrent falls    ADL: ADL Overall ADL's : Needs assistance/impaired Grooming: Wash/dry hands, Wash/dry face, Supervision/safety, Sitting Grooming Details (indicate cue type and reason): in recliner Upper Body Dressing : Minimal assistance, Cueing for sequencing, Sitting Lower Body Dressing: Moderate assistance, Sitting/lateral leans, Cueing for compensatory techniques Lower Body Dressing Details (indicate cue type and reason): assistance to donn socks with difficutly crossing legs to don Toilet Transfer: Minimal assistance, Rolling walker (2 wheels), Ambulation Toilet Transfer Details (indicate cue type and reason): transfer to [facility] from EOB with RW and min assist Toileting- Clothing Manipulation and Hygiene: Supervision/safety, Sitting/lateral lean Toileting - Clothing Manipulation Details (indicate cue type and reason): performed toilet hygiene seated General  ADL Comments: Patient required cues for safety and attending to right  Cognition: Cognition Overall Cognitive Status: Impaired/Different from baseline Arousal/Alertness: Awake/alert Orientation Level: Oriented X4 Year: 2023 Month: July Day of Week: Correct Memory: Impaired Memory Impairment: Retrieval deficit, Storage deficit Awareness: Impaired Awareness Impairment: Anticipatory impairment Problem Solving: Impaired Problem Solving Impairment: Verbal complex Cognition Arousal/Alertness: Awake/alert Behavior During Therapy: WFL for tasks assessed/performed Overall Cognitive Status: Impaired/Different from baseline Area of Impairment: Awareness, Problem solving, Safety/judgement, Attention, Memory Current Attention Level: Sustained, Selective Memory: Decreased short-term memory Safety/Judgement: Decreased awareness of deficits, Decreased awareness of safety Awareness: Emergent Problem Solving: Requires verbal cues, Requires tactile cues General Comments: aware of visual deficits,turns head to locate items on right  Physical Exam: Blood pressure (!) 139/48, pulse (!) 57, temperature 98.2 F (36.8 C), temperature source Oral, resp. rate 15, height _0  (1.6 m), weight 71 kg, SpO2 96 %. Physical Exam Vitals and nursing note reviewed. Exam conducted with a chaperone present.  Constitutional:      Appearance: Normal appearance. She is normal weight. She is not ill-appearing.     Comments: Sitting up in bed slightly- husband at bedside; appears younger than stated age, NAD  HENT:     Head: Normocephalic and atraumatic.     Comments: No facial droop Tongue midline    Right Ear: External ear normal.     Left Ear: External ear normal.     Nose: Nose normal. No congestion.     Mouth/Throat:     Mouth: Mucous membranes are moist.     Pharynx: Oropharynx is clear. No oropharyngeal exudate.  Eyes:     General:        Right eye: No discharge.        Left eye: No discharge.      Extraocular Movements: Extraocular movements intact.     Pupils: Pupils are equal, round, and reactive to light.     Comments: R hemianopsia noted  Cardiovascular:     Rate and Rhythm: Normal rate and regular rhythm.     Heart sounds: Normal heart sounds. No murmur heard.    No gallop.  Pulmonary:     Effort: Pulmonary effort is normal. No respiratory distress.     Breath sounds: Normal breath sounds. No stridor. No wheezing or rales.  Abdominal:     General: Abdomen is flat. Bowel sounds are normal. There is no distension.     Palpations: Abdomen is soft.     Tenderness: There is abdominal tenderness.     Comments: Multpile injection bruises All over the abdomen  Musculoskeletal:     Cervical back: Neck supple. No tenderness.     Right lower leg: No edema.     Left lower leg: No edema.     Comments: RUE- 4+/5 throughout LUE 5/5 RLE_ 4-/5 throughout LLE- 5/5   Skin:    General: Skin is warm and dry.     Comments: Back incision continues to heal nicely Abd bruising as detailed above  Neurological:     Mental Status: She is alert and oriented to person, place, and time.     Comments: Intact to light touch in all 4 extremties  Psychiatric:     Comments: Slightly decreased memory     Results for orders placed or performed during the hospital encounter of 05/14/22 (from the past 48 hour(s))  Basic metabolic panel     Status: Abnormal   Collection Time: 05/21/22 12:36 AM  Result Value Ref Range   Sodium 133 (L) 135 - 145 mmol/L   Potassium 3.8 3.5 - 5.1 mmol/L   Chloride 102 98 - 111 mmol/L   CO2 20 (L) 22 - 32 mmol/L   Glucose, Bld 103 (H) 70 - 99 mg/dL    Comment: Glucose reference range applies only to samples taken after fasting for at least 8 hours.   BUN 27 (H) 8 - 23 mg/dL   Creatinine, Ser 2.06 (H) 0.44 - 1.00 mg/dL   Calcium 9.0 8.9 - 10.3 mg/dL   GFR, Estimated 25 (L) >60 mL/min    Comment: (NOTE) Calculated using the CKD-EPI Creatinine Equation (2021)     Anion gap 11 5 - 15    Comment: Performed at [facility] [address], [place], [place] [address]  CBC     Status: Abnormal   Collection Time: 05/22/22  3:58 AM  Result Value Ref Range   WBC 8.8 4.0 - 10.5 K/uL   RBC 2.75 (L) 3.87 - 5.11 MIL/uL   Hemoglobin 8.8 (L) 12.0 - 15.0 g/dL   HCT 26.6 (L) 36.0 - 46.0 %   MCV 96.7 80.0 - 100.0 fL   MCH 32.0 26.0 - 34.0 pg   MCHC 33.1 30.0 - 36.0 g/dL   RDW 14.3 11.5 - 15.5 %   Platelets 271 150 - 400 K/uL   nRBC 0.0 0.0 - 0.2 %    Comment: Performed at [facility], [place] [address] [place], [place] [address]  Comprehensive metabolic panel     Status: Abnormal   Collection Time: 05/22/22  3:58 AM  Result Value Ref Range   Sodium 134 (L) 135 - 145 mmol/L   Potassium 3.7 3.5 - 5.1 mmol/L   Chloride 107 98 - 111 mmol/L   CO2 21 (L) 22 - 32 mmol/L   Glucose, Bld 94 70 - 99 mg/dL    Comment: Glucose reference range applies only to samples taken after fasting for at least 8 hours.   BUN 19 8 - 23 mg/dL   Creatinine, Ser 1.72 (H) 0.44 - 1.00 mg/dL   Calcium 8.8 (L) 8.9 -  10.3 mg/dL   Total Protein 6.5 6.5 - 8.1 g/dL   Albumin 3.1 (L) 3.5 - 5.0 g/dL   AST 22 15 - 41 U/L   ALT 18 0 - 44 U/L   Alkaline Phosphatase 106 38 - 126 U/L   Total Bilirubin 0.4 0.3 - 1.2 mg/dL   GFR, Estimated 31 (L) >60 mL/min    Comment: (NOTE) Calculated using the CKD-EPI Creatinine Equation (2021)    Anion gap 6 5 - 15    Comment: Performed at [facility] [address], [place], [place] [address]   No results found.    Blood pressure (!) 139/48, pulse (!) 57, temperature 98.2 F (36.8 C), temperature source Oral, resp. rate 15, height _0  (1.6 m), weight 71 kg, SpO2 96 %.  Medical Problem List and Plan: 1. Functional deficits secondary to acute left PCA infarct, recent L4-L5 lumbar fusion  -patient may  shower  -ELOS/Goals: 7-10 days supervision 2.  Antithrombotics: -DVT/anticoagulation:  Pharmaceutical: Lovenox (changed from  heparin)  -antiplatelet therapy: Aspirin and Plavix started 7/5 for 3 weeks then aspirin alone 3. Pain Management: Topamax 25 mg twice daily  -Fioricet 1 every 6 hours as needed  -Robaxin 500 mg every 6 hours as needed  -Percocet 5/325 6 hours as needed 4. Mood/Behavior/Sleep: CSW to evaluate and provide emotional support  -Xanax 0.5 mg q HS as needed  -antipsychotic agents: n/a 5. Neuropsych/cognition: This patient is capable of making decisions on her own behalf. 6. Skin/Wound Care: Routine skin care checks 7. Fluids/Electrolytes/Nutrition: Routine Is and Os and follow-up chemistries 8.  Hypertension: Continue amlodipine 10 mg daily  - continue chlorthalidone 25 mg daily  -continue clonidine 0.1 mg nightly  -continue hydralazine 100 mg 3 times daily 9.  Hyperlipidemia: Continue atorvastatin 40 mg daily 10: Oral thrush: Continue nystatin suspension 4 times daily 11: Acute kidney injury atop chronic kidney disease stage IIIb: Improved ; follow-up BMP.  Baseline serum creatinine 1.4-1.6 12: Mild hyponatremia: Follow-up BMP 13: Anemia of chronic disease: Asymptomatic, follow-up CBC 14: Chronic back pain status post lumbar surgery 6/15  -Follow-up with Dr. Ellene Route 15: Aortic arch and descending thoracic aortic disease:  -Follow-up CTA as outpatient  -Follow-up CT surgery, vascular surgery  I have personally performed a face to face diagnostic evaluation of this patient and formulated the key components of the plan.  Additionally, I have personally reviewed laboratory data, imaging studies, as well as relevant notes and concur with the physician assistant's documentation above.   The patient's status has not changed from the original H&P.  Any changes in documentation from the acute care chart have been noted above.     Barbie Banner, PA-C 05/22/2022

## 2022-05-22 NOTE — Progress Notes (Signed)
Report given to inpatient rehab RN, Kenney Houseman. All questions answered. Will bring patient over as soon as possible.  Robin Arellano

## 2022-05-22 NOTE — Care Management Important Message (Signed)
Important Message  Patient Details  Name: Robin Arellano MRN: [id number] Date of Birth: 1951/07/23   Medicare Important Message Given:  Yes     Geraldene Eisel Montine Circle 05/22/2022, 3:44 PM

## 2022-05-23 DIAGNOSIS — G8918 Other acute postprocedural pain: Secondary | ICD-10-CM | POA: Diagnosis not present

## 2022-05-23 DIAGNOSIS — I63532 Cerebral infarction due to unspecified occlusion or stenosis of left posterior cerebral artery: Secondary | ICD-10-CM | POA: Diagnosis not present

## 2022-05-23 DIAGNOSIS — I10 Essential (primary) hypertension: Secondary | ICD-10-CM | POA: Diagnosis not present

## 2022-05-23 DIAGNOSIS — E663 Overweight: Secondary | ICD-10-CM | POA: Diagnosis not present

## 2022-05-23 LAB — COMPREHENSIVE METABOLIC PANEL
ALT: 17 U/L (ref 0–44)
AST: 24 U/L (ref 15–41)
Albumin: 3.4 g/dL — ABNORMAL LOW (ref 3.5–5.0)
Alkaline Phosphatase: 115 U/L (ref 38–126)
Anion gap: 8 (ref 5–15)
BUN: 16 mg/dL (ref 8–23)
CO2: 24 mmol/L (ref 22–32)
Calcium: 9.5 mg/dL (ref 8.9–10.3)
Chloride: 105 mmol/L (ref 98–111)
Creatinine, Ser: 1.75 mg/dL — ABNORMAL HIGH (ref 0.44–1.00)
GFR, Estimated: 31 mL/min — ABNORMAL LOW (ref >60)
Glucose, Bld: 104 mg/dL — ABNORMAL HIGH (ref 70–99)
Potassium: 4.5 mmol/L (ref 3.5–5.1)
Sodium: 137 mmol/L (ref 135–145)
Total Bilirubin: 0.3 mg/dL (ref 0.3–1.2)
Total Protein: 7.3 g/dL (ref 6.5–8.1)

## 2022-05-23 LAB — CBC WITH DIFFERENTIAL/PLATELET
Abs Immature Granulocytes: 0.05 10*3/uL (ref 0.00–0.07)
Basophils Absolute: 0.1 10*3/uL (ref 0.0–0.1)
Basophils Relative: 1 %
Eosinophils Absolute: 0.1 10*3/uL (ref 0.0–0.5)
Eosinophils Relative: 2 %
HCT: 30.6 % — ABNORMAL LOW (ref 36.0–46.0)
Hemoglobin: 10 g/dL — ABNORMAL LOW (ref 12.0–15.0)
Immature Granulocytes: 1 %
Lymphocytes Relative: 18 %
Lymphs Abs: 1.6 10*3/uL (ref 0.7–4.0)
MCH: 32.5 pg (ref 26.0–34.0)
MCHC: 32.7 g/dL (ref 30.0–36.0)
MCV: 99.4 fL (ref 80.0–100.0)
Monocytes Absolute: 0.9 10*3/uL (ref 0.1–1.0)
Monocytes Relative: 10 %
Neutro Abs: 6.3 10*3/uL (ref 1.7–7.7)
Neutrophils Relative %: 68 %
Platelets: 316 10*3/uL (ref 150–400)
RBC: 3.08 MIL/uL — ABNORMAL LOW (ref 3.87–5.11)
RDW: 14.5 % (ref 11.5–15.5)
WBC: 9 10*3/uL (ref 4.0–10.5)
nRBC: 0 % (ref 0.0–0.2)

## 2022-05-23 MED ORDER — ADULT MULTIVITAMIN W/MINERALS CH
1.0000 | ORAL_TABLET | Freq: Every day | ORAL | Status: DC
  Administered 2022-05-23 – 2022-05-25 (×3): 1 via ORAL
  Filled 2022-05-23 (×4): qty 1

## 2022-05-23 NOTE — Progress Notes (Signed)
PROGRESS NOTE   Subjective/Complaints: Complains of low back pain, abdominal pain from the Lovenox injections.  Discussed improving hemoglobin Had BM yesterday  ROS: +postoperative spinal pain   Objective:   No results found. Recent Labs    05/22/22 0358 05/23/22 0724  WBC 8.8 9.0  HGB 8.8* 10.0*  HCT 26.6* 30.6*  PLT 271 316   Recent Labs    05/22/22 0358 05/23/22 0724  NA 134* 137  K 3.7 4.5  CL 107 105  CO2 21* 24  GLUCOSE 94 104*  BUN 19 16  CREATININE 1.72* 1.75*  CALCIUM 8.8* 9.5    Intake/Output Summary (Last 24 hours) at 05/23/2022 1120 Last data filed at 05/23/2022 0956 Gross per 24 hour  Intake 238 ml  Output --  Net 238 ml        Physical Exam: Vital Signs Blood pressure (!) 158/62, pulse (!) 52, temperature 98.6 F (37 C), temperature source Oral, resp. rate 16, height 5' 2.99" (1.6 m), weight 71 kg, SpO2 96 %. Gen: no distress, normal appearing, overweight BMI 27.73 HEENT: oral mucosa pink and moist, NCAT, R hemianopsia noted  Cardio: Bradycardia Chest: normal effort, normal rate of breathing Abdominal:     General: Abdomen is flat. Bowel sounds are normal. There is no distension.     Palpations: Abdomen is soft.     Tenderness: There is abdominal tenderness.     Comments: Multpile injection bruises All over the abdomen  Musculoskeletal:     Cervical back: Neck supple. No tenderness.     Right lower leg: No edema.     Left lower leg: No edema.     Comments: RUE- 4+/5 throughout LUE 5/5 RLE_ 4-/5 throughout LLE- 5/5   Skin:    General: Skin is warm and dry.     Comments: Back incision continues to heal nicely Abd bruising as detailed above  Neurological:     Mental Status: She is alert and oriented to person, place, and time.     Comments: Intact to light touch in all 4 extremties  Psychiatric:     Comments: Slightly decreased memory     Assessment/Plan: 1. Functional  deficits which require 3+ hours per day of interdisciplinary therapy in a comprehensive inpatient rehab setting. Physiatrist is providing close team supervision and 24 hour management of active medical problems listed below. Physiatrist and rehab team continue to assess barriers to discharge/monitor patient progress toward functional and medical goals  Care Tool:  Bathing    Body parts bathed by patient: Right arm, Left arm, Chest, Abdomen, Front perineal area, Buttocks, Face, Left lower leg, Right lower leg, Left upper leg, Right upper leg         Bathing assist Assist Level: Minimal Assistance - Patient > 75% (standing balance)     Upper Body Dressing/Undressing Upper body dressing   What is the patient wearing?: Pull over shirt    Upper body assist Assist Level: Minimal Assistance - Patient > 75%    Lower Body Dressing/Undressing Lower body dressing      What is the patient wearing?: Underwear/pull up, Pants     Lower body assist Assist for lower body dressing:  Minimal Assistance - Patient > 75%     Toileting Toileting    Toileting assist Assist for toileting: Minimal Assistance - Patient > 75% (standing balance)     Transfers Chair/bed transfer  Transfers assist     Chair/bed transfer assist level: Minimal Assistance - Patient > 75%     Locomotion Ambulation   Ambulation assist              Walk 10 feet activity   Assist           Walk 50 feet activity   Assist           Walk 150 feet activity   Assist           Walk 10 feet on uneven surface  activity   Assist           Wheelchair     Assist               Wheelchair 50 feet with 2 turns activity    Assist            Wheelchair 150 feet activity     Assist          Blood pressure (!) 158/62, pulse (!) 52, temperature 98.6 F (37 C), temperature source Oral, resp. rate 16, height 5' 2.99" (1.6 m), weight 71 kg, SpO2 96 %.     Medical Problem List and Plan: 1. Functional deficits secondary to acute left PCA infarct, recent L4-L5 lumbar fusion             -patient may  shower             -ELOS/Goals: 7-10 days supervision 2.  Antithrombotics: -DVT/anticoagulation:  Pharmaceutical: Lovenox (changed from heparin)             -antiplatelet therapy: Aspirin and Plavix started 7/5 for 3 weeks then aspirin alone 3. Pain Management: Topamax 25 mg twice daily             -Fioricet 1 every 6 hours as needed             -Robaxin 500 mg every 6 hours as needed             -Percocet 5/325 6 hours as needed 4. Mood/Behavior/Sleep: CSW to evaluate and provide emotional support             -Xanax 0.5 mg q HS as needed             -antipsychotic agents: n/a 5. Neuropsych/cognition: This patient is capable of making decisions on her own behalf. 6. Skin/Wound Care: Routine skin care checks 7. Fluids/Electrolytes/Nutrition: Routine Is and Os and follow-up chemistries 8.  Hypertension: Continue amlodipine 10 mg daily             - continue chlorthalidone 25 mg daily             -continue clonidine 0.1 mg nightly             -continue hydralazine 100 mg 3 times daily  -will not increase further given her bradycardia 9.  Hyperlipidemia: Continue atorvastatin 40 mg daily 10: Oral thrush: Continue nystatin suspension 4 times daily 11: Acute kidney injury atop chronic kidney disease stage IIIb: Improved ; follow-up BMP.  Baseline serum creatinine 1.4-1.6 12: Mild hyponatremia: Follow-up BMP 13: Anemia of chronic disease: Asymptomatic, follow-up CBC 14: Chronic back pain status post lumbar surgery 6/15             -  Follow-up with Dr. Ellene Route 15: Aortic arch and descending thoracic aortic disease:             -Follow-up CTA as outpatient             -Follow-up CT surgery, vascular surgery 16. Bradycardia: medications reviewed and will maintain anti-hypertensives given her elevated blood pressure.  17. Recent 20 lbs: liberalized diet  to regular  LOS: 1 days A FACE TO FACE EVALUATION WAS PERFORMED  Devaun Hernandez P Aylana Hirschfeld 05/23/2022, 11:20 AM

## 2022-05-23 NOTE — Evaluation (Signed)
Occupational Therapy Assessment and Plan  Patient Details  Name: Robin Arellano MRN: [id number] Date of Birth: Mar 30, 1951  OT Diagnosis: cognitive deficits and hemiplegia affecting dominant side Rehab Potential: Rehab Potential (ACUTE ONLY): Good ELOS: 7-10 days   Today's Date: 05/23/2022 OT Individual Time: 5056-9794 OT Individual Time Calculation (min): 78 min     Hospital Problem: Principal Problem:   Acute left PCA stroke ([place])   Past Medical History:  Past Medical History:  Diagnosis Date   Anxiety    on meds   Back pain    Chronic female pelvic pain    Coronary artery disease    mild, non-obstructive 11/2018   Depression    on meds   Family history of adverse reaction to anesthesia    sister had difficulty waking up   Fibromyalgia    H/O leukocytosis    Headache    Hyperlipidemia    on meds   Hypertension    on meds   MI (myocardial infarction) ([place])    Pt states she did not have a MI- EKG was normal, was GERD   Osteoarthritis    on meds   Ovarian cyst, right    PONV (postoperative nausea and vomiting)    Post-operative nausea and vomiting    SVD (spontaneous vaginal delivery)    x 2   Vitamin D deficiency    Past Surgical History:  Past Surgical History:  Procedure Laterality Date   ABDOMINAL HYSTERECTOMY  1994   TAH.BSO   ANTERIOR CERVICAL DECOMP/DISCECTOMY FUSION  2019   APPENDECTOMY  1975   BACK SURGERY  2023   BIOPSY  05/12/2020   Procedure: BIOPSY;  Surgeon: Milus Banister, MD;  Location: [facility];  Service: Endoscopy;;   CARDIAC CATHETERIZATION  2020   CHOLECYSTECTOMY N/A 07/21/2020   Procedure: LAPAROSCOPIC CHOLECYSTECTOMY WITH INTRAOPERATIVE CHOLANGIOGRAM;  Surgeon: [name], MD;  Location: [place] [facility];  Service: General;  Laterality: N/A;   COLONOSCOPY  08/12/2017   Hx TA (piecemeal)Jacobs-MAC-suprep (good)   ESOPHAGOGASTRODUODENOSCOPY (EGD) WITH PROPOFOL N/A 05/12/2020   Procedure: ESOPHAGOGASTRODUODENOSCOPY (EGD) WITH  PROPOFOL;  Surgeon: Milus Banister, MD;  Location: [facility];  Service: Endoscopy;  Laterality: N/A;   EUS N/A 05/12/2020   Procedure: UPPER ENDOSCOPIC ULTRASOUND (EUS) RADIAL;  Surgeon: Milus Banister, MD;  Location: [facility];  Service: Endoscopy;  Laterality: N/A;   KNEE SURGERY Bilateral 1996   x 2 - arthroscopic   LEFT HEART CATH AND CORONARY ANGIOGRAPHY N/A 11/25/2018   Procedure: LEFT HEART CATH AND CORONARY ANGIOGRAPHY;  Surgeon: Troy Sine, MD;  Location: [facility];  Service: Cardiovascular;  Laterality: N/A;   PELVIC LAPAROSCOPY  1989   W LYSIS OF ADHESIONS/L SALPINGONEOSTOMY   TUBAL LIGATION     WISDOM TOOTH EXTRACTION      Assessment & Plan Clinical Impression: Patient is a 71 y.o. year old female who presented to the emergency department on 05/14/2022 after sustaining a fall at approximately 2 AM.  She had recently undergone back surgery and had taken a Robaxin for left leg pain.  She had stopped aspirin therapy prior to surgery and had not restarted it.  She fell backwards and denied loss of consciousness, headache or dizziness.  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  Cardiothoracic surgery consulted.  Admitted to ICU and started on esmolol for hypertensive emergency.  Vascular surgery  was also consulted to evaluate her descending thoracic aortic disease.  She was seen by Dr. Stanford Breed on 7/4 with plans to repeat CT angiogram of chest abdomen and pelvis in 48 hours.  This was delayed due to acute kidney injury.  Arrangements made to perform this as outpatient.  No surgical intervention advised during hospitalization.  CT of the head revealed suspicious acute left infarct and MRI of the brain confirmed an acute/subacute left PCA infarct and neurology was consulted for stroke work-up.  Plavix and aspirin started on 7/5 for 3 weeks then  aspirin alone.  She is tolerating heart healthy diet. The patient requires inpatient physical medicine and rehabilitation evaluations and treatment secondary to dysfunction due to all deficits secondary to acute left PCA infarct and recent back surgery.  Patient transferred to CIR on 05/22/2022 .    The patient is status post bilateral laminectomy L4-L5 decompression of L4 and L5 nerve roots secondary to spondylolisthesis of L4-L5 with L4 radiculopathy bilaterally on 04/26/2022 by Dr. Ellene Route.  Patient currently requires min with basic self-care skills secondary to impaired timing and sequencing, safety awareness, and decreased functional endurance.  Prior to hospitalization, patient could complete ADLs with modified independence - supervision (will need to confirm with family).  Patient will benefit from skilled intervention to decrease level of assist with basic self-care skills and increase independence with basic self-care skills prior to discharge home with care partner.  Anticipate patient will require 24 hour supervision and follow up outpatient.  OT - End of Session Activity Tolerance: Tolerates 30+ min activity with multiple rests Endurance Deficit: Yes Endurance Deficit Description: Increased fatigue after completing ADL routine OT Assessment Rehab Potential (ACUTE ONLY): Good OT Patient demonstrates impairments in the following area(s): Balance;Behavior;Perception;Cognition;Safety;Endurance;Motor;Vision OT Basic ADL's Functional Problem(s): Bathing;Dressing;Toileting;Grooming OT Advanced ADL's Functional Problem(s): Simple Meal Preparation OT Transfers Functional Problem(s): Toilet;Tub/Shower OT Additional Impairment(s): Fuctional Use of Upper Extremity (potentially very minimal hemi-paresis) OT Plan OT Intensity: Minimum of 1-2 x/day, 45 to 90 minutes OT Frequency: 5 out of 7 days OT Duration/Estimated Length of Stay: 7-10 days OT Treatment/Interventions: Balance/vestibular  training;Discharge planning;Self Care/advanced ADL retraining;Therapeutic Activities;UE/LE Coordination activities;Cognitive remediation/compensation;Functional mobility training;Patient/family education;Therapeutic Exercise;Visual/perceptual remediation/compensation;Community reintegration;DME/adaptive equipment instruction;Neuromuscular re-education;UE/LE Strength taining/ROM OT Self Feeding Anticipated Outcome(s): modified independence OT Basic Self-Care Anticipated Outcome(s): modified independence & supervision with bathing OT Toileting Anticipated Outcome(s): modified independence OT Bathroom Transfers Anticipated Outcome(s): modified independence OT Recommendation Patient destination: Home Follow Up Recommendations: Outpatient OT;24 hour supervision/assistance Equipment Details: Pt has shower chair, grab bars around toilet & shower, raised toilet seat, FWW, SPC.   OT Evaluation Precautions/Restrictions     05/23/22 0835  Precautions  Precautions Back;Fall (recent spinal L4-L5 sx on 04/26/22)  Precaution Booklet Issued No  Required Braces or Orthoses Spinal Brace  Spinal Brace Applied in sitting position;Lumbar corset (LSO)  Restrictions  Weight Bearing Restrictions No  Other Position/Activity Restrictions Brace in room - LSO   General    05/23/22 0838  General  Chart Reviewed Yes  Family/Caregiver Present No   Vital Signs Please see "Flowsheet" for most recent vital signs charted by nursing. Pain Pain Assessment Pain Score: 4  Pain Type: Acute pain Pain Location: Back Home Living/Prior Functioning Home Living Family/patient expects to be discharged to:: Private residence Living Arrangements: Spouse/significant other, Children Available Help at Discharge: Family, Available 24 hours/day Type of Home: House Entrance Stairs-Number of Steps: 5 Entrance Stairs-Rails: Right, Left Home Layout: One level Bathroom Shower/Tub: Walk-in shower, Curtain (However, pt report  having a  tub-shower as well with a curtain.) Bathroom Toilet: Standard Bathroom Accessibility: Yes Additional Comments: Pt reports that spouse and daughter live with pt and sister can provide assitsance as needed. Chart review reports that pt has a shower chair, grab bars around the toilet & shower, raised toilet seat, FWW, SPC. Pt confirms having this DME  Lives With: Spouse, Daughter IADL History Homemaking Responsibilities: No (Reports that family can provide assistance.) Occupation: Retired Type of Occupation: Forensic psychologist IADL Comments: Pt reports that family can provide assistance with IADLs. Prior Function Level of Independence: Independent with basic ADLs, Independent with homemaking with ambulation, Needs assistance with homemaking (Pt reports using FWW and that family was providing assistance after spinal sx, but pt started to perform ADLs with modified independence)  Able to Take Stairs?: Yes Driving: No (Not driving after spinal sx) Vocation: Retired Surveyor, mining Baseline Vision/History: 1 Wears glasses Ability to See in Adequate Light: 0 Adequate Patient Visual Report: Blurring of vision;Peripheral vision impairment (R inattention, however, pt reports no visual changes.) Vision Assessment?: Vision impaired- to be further tested in functional context (Pt demo's R inattention, however, unable to complete full visual assessment due to time constraint. Need further assessment.) Depth Perception: Overshoots;Undershoots (Slight) Perception  Perception: Impaired Inattention/Neglect: Impaired-to be further tested in functional context;Does not attend to right visual field (Decreased visual attention to R side, need further assessment.) Praxis Praxis: Intact Praxis Impairment Details: Initiation (Requires minimal VCs for initiation and sequencing of some ADL tasks) Cognition Cognition Overall Cognitive Status: Impaired/Different from baseline Arousal/Alertness:  Awake/alert Orientation Level: Person;Place;Situation Person: Oriented Place: Oriented Situation: Oriented Memory: Impaired Memory Impairment: Storage deficit;Retrieval deficit Attention: Sustained;Focused Focused Attention: Impaired (requires VCs for attention to task) Sustained Attention: Impaired (requires VCs for attention to task) Sustained Attention Impairment: Verbal basic;Functional basic Awareness: Impaired Awareness Impairment: Emergent impairment Problem Solving: Impaired Problem Solving Impairment: Functional basic;Verbal basic Executive Function:  (Impaired executive functioning) Behaviors: Restless;Impulsive;Perseveration (potential confabulation, verbal perseveration - would need family to confirm if pt is confabulating, d/t pt being a questionable historian - verbal perseveration with BIMs words) Safety/Judgment: Impaired Brief Interview for Mental Status (BIMS) Repetition of Three Words (First Attempt): 3 Temporal Orientation: Year: Correct Temporal Orientation: Month: Accurate within 5 days Temporal Orientation: Day: Correct Recall: "Sock": No, could not recall Recall: "Blue": Yes, no cue required Recall: "Bed": No, could not recall BIMS Summary Score: 11 Sensation Sensation Additional Comments: Pt reports no sensation deficits, however, pt able to able to determine safe water temperature when bathing. Coordination Gross Motor Movements are Fluid and Coordinated: Yes (grossly functional - need formal testing) Fine Motor Movements are Fluid and Coordinated: Yes (grossly functional - need formal testing) Finger Nose Finger Test: TBD 9 Hole Peg Test: TBD Motor  Motor Motor: Within Functional Limits (functional motor control - will need formal testing to determine if slight hemi-paresis is present)  Trunk/Postural Assessment  Cervical Assessment Cervical Assessment: Within Functional Limits Thoracic Assessment Thoracic Assessment: Within Functional  Limits Lumbar Assessment Lumbar Assessment: Within Functional Limits Postural Control Postural Control: Within Functional Limits  Balance Balance Balance Assessed: Yes (Balance not formally assessed, only assessed with ADLs - pt requires minimal assistance with use of FWW to ensurace safe standing balance.) Dynamic Sitting Balance Sitting balance - Comments: close supervision Extremity/Trunk Assessment RUE Assessment RUE Assessment: Within Functional Limits [facility] with gross functional tasks - need formal testing in future session to determine strength.) LUE Assessment LUE Assessment: Within Functional Limits [facility] with gross functional tasks - need formal testing  in future session to determine strength.)  Care Tool Care Tool Self Care Eating   Eating Assist Level: Set up assist    Oral Care    Oral Care Assist Level: Minimal Assistance - Patient > 75%    Bathing   Body parts bathed by patient: Right arm;Left arm;Chest;Abdomen;Front perineal area;Buttocks;Face;Left lower leg;Right lower leg;Left upper leg;Right upper leg     Assist Level: Minimal Assistance - Patient > 75% (standing balance)    Upper Body Dressing(including orthotics)   What is the patient wearing?: Pull over shirt   Assist Level: Minimal Assistance - Patient > 75%    Lower Body Dressing (excluding footwear)   What is the patient wearing?: Underwear/pull up;Pants Assist for lower body dressing: Minimal Assistance - Patient > 75%    Putting on/Taking off footwear   What is the patient wearing?: Shoes;Socks Assist for footwear: Minimal Assistance - Patient > 75%       Care Tool Toileting Toileting activity   Assist for toileting: Minimal Assistance - Patient > 75% (standing balance)     Care Tool Bed Mobility Roll left and right activity   Roll left and right assist level: Contact Guard/Touching assist    Sit to lying activity   Sit to lying assist level: Contact Guard/Touching assist    Lying to  sitting on side of bed activity   Lying to sitting on side of bed assist level: the ability to move from lying on the back to sitting on the side of the bed with no back support.: Contact Guard/Touching assist     Care Tool Transfers Sit to stand transfer   Sit to stand assist level: Minimal Assistance - Patient > 75%    Chair/bed transfer   Chair/bed transfer assist level: Minimal Assistance - Patient > 75%     Toilet transfer   Assist Level: Minimal Assistance - Patient > 75%     Care Tool Cognition  Expression of Ideas and Wants Expression of Ideas and Wants: 3. Some difficulty - exhibits some difficulty with expressing needs and ideas (e.g, some words or finishing thoughts) or speech is not clear  Understanding Verbal and Non-Verbal Content Understanding Verbal and Non-Verbal Content: 3. Usually understands - understands most conversations, but misses some part/intent of message. Requires cues at times to understand   Memory/Recall Ability Memory/Recall Ability : Location of own room;That he or she is in a hospital/hospital unit   Refer to Care Plan for [place] 1 OT Short Term Goal 1 (Week 1): Pt will complete grooming at the sink with close supervision for safety with use of FWW for standing balance. OT Short Term Goal 2 (Week 1): Pt will complete UB dressing and LB dressing with close supervision with use of FWW for clothing retrieval/standing and AE as needed. OT Short Term Goal 3 (Week 1): Pt will complete toileting with close supervision and use of FWW as needed. OT Short Term Goal 4 (Week 1): Pt will complete toilet transfer with close supervision and use of FWW as needed. OT Short Term Goal 5 (Week 1): Pt will complete bathing with close supervision using compensatory techniques and AE as needed to improve safety and maintain spinal precautions.  Recommendations for other services: None    Skilled Therapeutic Intervention Pt awake sitting at  EOB with RN present in room upon OT arrival to the room. Pt reports, "My coworkers wouldn't believe the [employer] is in the hospital." Pt  in agreement for OT session.  ADL Grooming: Minimal assistance (Pt able to complete oral care standing at the sink with minimal assistance for standing balance with use of FWW. Pt able to perform hair management with set-up assist while seated in the w/c.) Where Assessed-Grooming: Standing at sink Upper Body Bathing: Supervision/safety;Minimal cueing (Pt able to bathe all UB parts with close supervision and minimal prompts for thoroughness while seated on the tub-bench in the shower.) Where Assessed-Upper Body Bathing: Shower Lower Body Bathing: Minimal assistance (Pt able to bathe all LB parts with close supervision while seated and minmal assistance to ensure standing balance while standing to bathe peri-areas.) Where Assessed-Lower Body Bathing: Shower Upper Body Dressing: Minimal assistance (Pt able to don a pull-over shirt while seated with minimal assistance to pull clothing down in the back. Pt requires minimal assistance to place LSO brace, however, pt is able to manage straps.) Where Assessed-Upper Body Dressing: Chair Lower Body Dressing: Minimal assistance;Minimal cueing (Pt able to don underwear and pants with close supervision while seated to thread BLE. However, pt requires minimal assistance with use of FWW to ensure standing balance while pulling clothing p.) Where Assessed-Lower Body Dressing: Chair (& standing with FWW to pull clothing up/down) Toileting: Minimal assistance (Pt able to perform 3/3 toileting tasks with minimal assistance for standing balance with [place] eof FWW) Where Assessed-Toileting: Glass blower/designer: Minimal assistance (Pt able to complete ambulatory transfer from EOB > toilet with minimal assistance and use of FWW.) Toilet Transfer Method: Counselling psychologist: Statistician: Minimal assistance (Pt able to perform ambulatory transfer from toilet > walk-in shower with minimal assistance with use of FWW and use of grab bars.) Social research officer, government Method: Heritage manager: Transfer tub bench;Grab bars ADL Comments: Pt participates well in ADL tasks and requires minimal assistance - CGA for safety during standing tasks. Pt requires minimal prompting for thoroughness and safety with tasks.  Mobility  Bed Mobility Bed Mobility: Supine to Sit;Sit to Supine;Rolling Right;Rolling Left Rolling Right: Supervision/verbal cueing Rolling Left: Supervision/Verbal cueing Supine to Sit: Supervision/Verbal cueing (Pt able to complete supine <> sit transfer with close supervision for safety.) Sit to Supine: Supervision/Verbal cueing (Pt able to complete supine <> sit transfer with close supervision for safety.) Transfers Sit to Stand: Minimal Assistance - Patient > 75% (Pt able to complete various sit <> stand transfers from various surfaces (bed, w/c toilet, etc) with minimal assistance and use of FWW for safety.) Stand to Sit: Minimal Assistance - Patient > 75% (Pt able to complete various sit <> stand transfers from various surfaces (bed, w/c toilet, etc) with minimal assistance and use of FWW for safety.)  Pt returned to bed at end of session. Pt left resting comfortably in bed with personal belongings and call light within reach, bed alarm on and activated, bed in low position, 3 bed rails up, and comfort needs attended to.    Discharge Criteria: Patient will be discharged from OT if patient refuses treatment 3 consecutive times without medical reason, if treatment goals not met, if there is a change in medical status, if patient makes no progress towards goals or if patient is discharged from hospital.  The above assessment, treatment plan, treatment alternatives and goals were discussed and mutually agreed upon: by patient  Barbee Shropshire 05/23/2022, 12:47 PM

## 2022-05-23 NOTE — Evaluation (Signed)
Physical Therapy Assessment and Plan  Patient Details  Name: Robin Arellano MRN: [id number] Date of Birth: [birth year]  PT Diagnosis: Abnormal posture, Abnormality of gait, Cognitive deficits, Difficulty walking, Impaired sensation, Muscle spasms, Muscle weakness, and Pain in lumbar spine Rehab Potential: Good ELOS: 10-12 days   Today's Date: 05/23/2022 PT Individual Time: 9833-8250 PT Individual Time Calculation (min): 58 min    Hospital Problem: Principal Problem:   Acute left PCA stroke ([place])   Past Medical History:  Past Medical History:  Diagnosis Date   Anxiety    on meds   Back pain    Chronic female pelvic pain    Coronary artery disease    mild, non-obstructive 11/2018   Depression    on meds   Family history of adverse reaction to anesthesia    sister had difficulty waking up   Fibromyalgia    H/O leukocytosis    Headache    Hyperlipidemia    on meds   Hypertension    on meds   MI (myocardial infarction) ([place])    Pt states she did not have a MI- EKG was normal, was GERD   Osteoarthritis    on meds   Ovarian cyst, right    PONV (postoperative nausea and vomiting)    Post-operative nausea and vomiting    SVD (spontaneous vaginal delivery)    x 2   Vitamin D deficiency    Past Surgical History:  Past Surgical History:  Procedure Laterality Date   ABDOMINAL HYSTERECTOMY  1994   TAH.BSO   ANTERIOR CERVICAL DECOMP/DISCECTOMY FUSION  2019   APPENDECTOMY  1975   BACK SURGERY  2023   BIOPSY  05/12/2020   Procedure: BIOPSY;  Surgeon: Milus Banister, MD;  Location: [facility];  Service: Endoscopy;;   CARDIAC CATHETERIZATION  2020   CHOLECYSTECTOMY N/A 07/21/2020   Procedure: LAPAROSCOPIC CHOLECYSTECTOMY WITH INTRAOPERATIVE CHOLANGIOGRAM;  Surgeon: [name], MD;  Location: [facility];  Service: General;  Laterality: N/A;   COLONOSCOPY  08/12/2017   Hx TA (piecemeal)Jacobs-MAC-suprep (good)   ESOPHAGOGASTRODUODENOSCOPY (EGD) WITH PROPOFOL N/A  05/12/2020   Procedure: ESOPHAGOGASTRODUODENOSCOPY (EGD) WITH PROPOFOL;  Surgeon: Milus Banister, MD;  Location: [facility];  Service: Endoscopy;  Laterality: N/A;   EUS N/A 05/12/2020   Procedure: UPPER ENDOSCOPIC ULTRASOUND (EUS) RADIAL;  Surgeon: Milus Banister, MD;  Location: [facility];  Service: Endoscopy;  Laterality: N/A;   KNEE SURGERY Bilateral 1996   x 2 - arthroscopic   LEFT HEART CATH AND CORONARY ANGIOGRAPHY N/A 11/25/2018   Procedure: LEFT HEART CATH AND CORONARY ANGIOGRAPHY;  Surgeon: Troy Sine, MD;  Location: [facility];  Service: Cardiovascular;  Laterality: N/A;   PELVIC LAPAROSCOPY  1989   W LYSIS OF ADHESIONS/L SALPINGONEOSTOMY   TUBAL LIGATION     WISDOM TOOTH EXTRACTION      Assessment & Plan Clinical Impression: Patient is a 71 y.o. year old female who presented to the emergency department on 05/14/2022 after sustaining a fall at approximately 2 AM.  She had recently undergone back surgery and had taken a Robaxin for left leg pain.  She had stopped aspirin therapy prior to surgery and had not restarted it.  She fell backwards and denied loss of consciousness, headache or dizziness.  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  Cardiothoracic surgery consulted.  Admitted to ICU and started  on esmolol for hypertensive emergency.  Vascular surgery was also consulted to evaluate her descending thoracic aortic disease.  She was seen by Dr. Stanford Breed on 7/4 with plans to repeat CT angiogram of chest abdomen and pelvis in 48 hours.  This was delayed due to acute kidney injury.  Arrangements made to perform this as outpatient.  No surgical intervention advised during hospitalization.  CT of the head revealed suspicious acute left infarct and MRI of the brain confirmed an acute/subacute left PCA infarct and neurology was consulted for stroke  work-up.  Plavix and aspirin started on 7/5 for 3 weeks then aspirin alone.  She is tolerating heart healthy diet. The patient requires inpatient physical medicine and rehabilitation evaluations and treatment secondary to dysfunction due to all deficits secondary to acute left PCA infarct and recent back surgery. Patient transferred to CIR on 05/22/2022 .   Patient currently requires min assist with mobility secondary to muscle weakness, decreased cardiorespiratoy endurance, decreased attention to right, decreased attention, decreased awareness, decreased problem solving, decreased safety awareness, decreased memory, and delayed processing, and decreased standing balance, decreased postural control, and decreased balance strategies.  Prior to hospitalization, patient was modified independent  with mobility and lived with Spouse, Daughter in a House home.  Home access is 5 .  Patient will benefit from skilled PT intervention to maximize safe functional mobility, minimize fall risk, and decrease caregiver burden for planned discharge home with 24 hour supervision.  Anticipate patient will benefit from follow up OP at discharge.  PT - End of Session Activity Tolerance: Tolerates 30+ min activity with multiple rests Endurance Deficit: Yes Endurance Deficit Description: reports significant fatigue at end of session PT Assessment Rehab Potential (ACUTE/IP ONLY): Good PT Barriers to Discharge: Inaccessible home environment;Decreased caregiver support PT Patient demonstrates impairments in the following area(s): Balance;Motor;Safety;Behavior;Nutrition;Sensory;Edema;Pain;Skin Integrity;Endurance;Perception PT Transfers Functional Problem(s): Bed Mobility;Bed to Chair;Car;Furniture PT Locomotion Functional Problem(s): Ambulation;Stairs PT Plan PT Intensity: Minimum of 1-2 x/day ,45 to 90 minutes PT Frequency: 5 out of 7 days PT Duration Estimated Length of Stay: 10-12 days PT Treatment/Interventions:  Ambulation/gait training;Balance/vestibular training;Cognitive remediation/compensation;Community reintegration;Discharge planning;Disease management/prevention;DME/adaptive equipment instruction;Functional electrical stimulation;Functional mobility training;Neuromuscular re-education;Pain management;Patient/family education;Psychosocial support;Skin care/wound management;Splinting/orthotics;Stair training;Therapeutic Activities;Therapeutic Exercise;UE/LE Strength taining/ROM;UE/LE Coordination activities;Visual/perceptual remediation/compensation PT Transfers Anticipated Outcome(s): mod-I using LRAD PT Locomotion Anticipated Outcome(s): supervision using LRAD PT Recommendation Follow Up Recommendations: Outpatient PT;24 hour supervision/assistance Patient destination: Home Equipment Recommended: To be determined   PT Evaluation Precautions/Restrictions Precautions Precautions: Back;Fall (recent spinal L4-L5 sx on 04/26/22) Precaution Booklet Issued: No Required Braces or Orthoses: Spinal Brace Spinal Brace: Applied in sitting position;Lumbar corset;Other (comment) Spinal Brace Comments: LSO in room Restrictions Weight Bearing Restrictions: No Pain Pain Assessment Pain Scale: 0-10 Pain Score: 4  Pain Type: Acute pain Pain Location: Back Pain Orientation: Lower Pain Descriptors / Indicators: Other (Comment) ("feels like a knife going through it") Pain Onset: On-going Pain Intervention(s): Medication (See eMAR);Rest;Relaxation;Emotional support;Repositioned Pain Interference Pain Interference Pain Effect on Sleep: 3. Frequently Pain Interference with Therapy Activities: 1. Rarely or not at all Pain Interference with Day-to-Day Activities: 2. Occasionally Home Living/Prior Functioning Home Living Available Help at Discharge: Family;Available 24 hours/day Type of Home: House Home Access: Stairs to enter [employer] of Steps: 5 Entrance Stairs-Rails: Right;Left Home  Layout: One level  Lives With: Spouse;Daughter (husband, Timmothy Sours and 13y.o. daughter, [name]) Prior Function Level of Independence: Independent with homemaking with ambulation;Needs assistance with homemaking;Independent with gait;Independent with transfers;Requires assistive device for independence (reports since lumbar surgery has been using  RW but prior to that was using SPC due to L LE weakness for 6 months)  Able to Take Stairs?: Yes Driving: No (hasn't driven in 6 months) Vocation: Retired Vision/Perception   Vision - History Ability to See in Adequate Light: 0 Adequate Perception Perception: Impaired Inattention/Neglect: Impaired-to be further tested in functional context;Does not attend to right visual field;Does not attend to right side of body Praxis Praxis: Impaired Cognition Overall Cognitive Status: Impaired/Different from baseline Arousal/Alertness: Awake/alert Orientation Level: Oriented X4 Year: 2023 Month: July Day of Week: Correct Attention: Focused;Sustained;Selective Focused Attention: Appears intact Sustained Attention: Impaired Sustained Attention Impairment: Verbal basic;Functional basic Selective Attention: Impaired Memory: Impaired Memory Impairment: Storage deficit;Retrieval deficit Awareness: Impaired Awareness Impairment: Emergent impairment Problem Solving: Impaired Problem Solving Impairment: Functional basic;Verbal basic Executive Function:  (Impaired executive functioning) Behaviors: Restless;Impulsive;Perseveration Safety/Judgment: Impaired Sensation  Sensation Light Touch: Impaired Detail Central sensation comments: reports "numb" feeling in L LE for years, NOT associated with CVA Peripheral sensation comments: reports "numbness" in both feet since prior to CVA Light Touch Impaired Details: Impaired LLE Hot/Cold: Not tested Proprioception: Impaired Detail Stereognosis: Not tested Coordination Gross Motor Movements are Fluid and  Coordinated: No Coordination and Movement Description: very mild R hemibody weakness with L LE weakness present prior to lumbar sx Motor  Motor Motor: Other (comment) Motor - Skilled Clinical Observations: very mild R hemibody paresis with hx of L LE weakness prior to lumbar sx   Trunk/Postural Assessment  Cervical Assessment Cervical Assessment: Within Functional Limits Thoracic Assessment Thoracic Assessment: Within Functional Limits Lumbar Assessment Lumbar Assessment: Exceptions to [facility] (recent lumbar sx) Postural Control Postural Control: Deficits on evaluation (using RW) Postural Limitations: requires use of RW and pt reports ~14 falls in past 6 months due to L LE pain and weakness  Balance Balance Balance Assessed: Yes Static Sitting Balance Static Sitting - Balance Support: Feet unsupported Static Sitting - Level of Assistance: 6: Modified independent (Device/Increase time) Dynamic Sitting Balance Dynamic Sitting - Balance Support: Feet supported Dynamic Sitting - Level of Assistance: 5: Stand by assistance Static Standing Balance Static Standing - Balance Support: During functional activity Static Standing - Level of Assistance: 5: Stand by assistance;Other (comment) (CGA) Dynamic Standing Balance Dynamic Standing - Balance Support: During functional activity;Bilateral upper extremity supported Dynamic Standing - Level of Assistance: Other (comment);4: Min assist (CGA) Extremity Assessment      RLE Assessment RLE Assessment: Exceptions to [facility] Active Range of Motion (AROM) Comments: WFL but would benefit from hamstring stretching General Strength Comments: assessed in supine RLE Strength Right Hip Flexion: 3/5 (unable to provide resistance due to pain) Right Knee Flexion: 3+/5 (limited ability to provide resistance due to pain) Right Knee Extension: 3/5 (unable to provide resistance due to pain) Right Ankle Dorsiflexion: 3+/5 Right Ankle Plantar Flexion: 3+/5 LLE  Assessment LLE Assessment: Exceptions to [facility] Active Range of Motion (AROM) Comments: WFL General Strength Comments: assessed in supine LLE Strength Left Hip Flexion: 3/5 (unable to provide resistance due to pain) Left Knee Flexion: 4-/5 Left Knee Extension: 3/5 (unable to provide resistance due to pain) Left Ankle Dorsiflexion: 3/5 (unable to provide resistance due to pain) Left Ankle Plantar Flexion: 3/5 (unable to provide resistance due to pain)  Care Tool Care Tool Bed Mobility Roll left and right activity   Roll left and right assist level: Supervision/Verbal cueing    Sit to lying activity   Sit to lying assist level: Supervision/Verbal cueing    Lying to sitting on side of bed activity  Lying to sitting on side of bed assist level: the ability to move from lying on the back to sitting on the side of the bed with no back support.: Minimal Assistance - Patient > 75%     Care Tool Transfers Sit to stand transfer   Sit to stand assist level: Minimal Assistance - Patient > 75% Sit to stand assistive device: Walker  Chair/bed transfer   Chair/bed transfer assist level: Minimal Assistance - Patient > 75% Chair/bed transfer assistive device: Proofreader transfer assist level: Minimal Assistance - Patient > 75% Health visitor Comment: RW    Care Tool Locomotion Ambulation   Assist level: Minimal Assistance - Patient > 75% Assistive device: Walker-rolling Max distance: 181f  Walk 10 feet activity   Assist level: Minimal Assistance - Patient > 75% Assistive device: Walker-rolling   Walk 50 feet with 2 turns activity   Assist level: Minimal Assistance - Patient > 75% Assistive device: Walker-rolling  Walk 150 feet activity   Assist level: Minimal Assistance - Patient > 75% Assistive device: Walker-rolling  Walk 10 feet on uneven surfaces activity   Assist level: Minimal Assistance - Patient > 75% Assistive device:  Walker-rolling  Stairs   Assist level: Minimal Assistance - Patient > 75% Stairs assistive device: 2 hand rails Max number of stairs: 8  Walk up/down 1 step activity   Walk up/down 1 step (curb) assist level: Minimal Assistance - Patient > 75% Walk up/down 1 step or curb assistive device: 2 hand rails  Walk up/down 4 steps activity   Walk up/down 4 steps assist level: Minimal Assistance - Patient > 75% Walk up/down 4 steps assistive device: 2 hand rails  Walk up/down 12 steps activity Walk up/down 12 steps activity did not occur: Safety/medical concerns      Pick up small objects from floor   Pick up small object from the floor assist level: Minimal Assistance - Patient > 75% Pick up small object from the floor assistive device: using RW and reacher to maintain precautions  Wheelchair Is the patient using a wheelchair?: No          Wheel 50 feet with 2 turns activity      Wheel 150 feet activity        Refer to Care Plan for Long Term Goals  SHORT TERM GOAL WEEK 1 PT Short Term Goal 1 (Week 1): Pt will perform supine<>sit with supervision PT Short Term Goal 2 (Week 1): Pt will perform sit<>stand transfers using LRAD with supervision PT Short Term Goal 3 (Week 1): Pt will perform bed<>chair transfers using LRAD with supervision PT Short Term Goal 4 (Week 1): Pt will ambulate at least 1553fusing LRAD with no more than CGA PT Short Term Goal 5 (Week 1): Pt will navigate 4 steps using HRs per home set-up with no more than CGA  Recommendations for other services: None   Skilled Therapeutic Intervention Pt received supine in bed with her 2 sisters present and pt agreeable to therapy session. Evaluation completed (see details above) with patient education regarding purpose of PT evaluation, PT POC and goals, therapy schedule, weekly team meetings, and other CIR information including safety plan and fall risk safety. Pt able to recall need to perform logroll technique to maintain  spine precautions during bed mobility with question cuing. Donned LSO sitting EOB with set-up assist. Pt performed the below functional mobility tasks  with the specified levels of skilled cuing and assistance. At end of session, pt left supine in bed with needs in reach and bed alarm on.   Mobility Bed Mobility Bed Mobility: Sit to Supine;Supine to Sit (via logroll technique to maintain spine precautions) Rolling Right: Supervision/verbal cueing Rolling Left: Supervision/Verbal cueing Supine to Sit: Minimal Assistance - Patient > 75% Sit to Supine: Supervision/Verbal cueing Transfers Transfers: Sit to Stand;Stand to Sit;Stand Pivot Transfers Sit to Stand: Minimal Assistance - Patient > 75%;Contact Guard/Touching assist (notice to push backs of legs against chair while rising to stnad) Stand to Sit: Minimal Assistance - Patient > 75%;Contact Guard/Touching assist Stand Pivot Transfers: Minimal Assistance - Patient > 75%;Contact Guard/Touching assist Stand Pivot Transfer Details: Tactile cues for weight shifting;Tactile cues for sequencing;Verbal cues for sequencing;Verbal cues for technique;Verbal cues for gait pattern;Verbal cues for precautions/safety;Verbal cues for safe use of DME/AE;Visual cues for safe use of DME/AE Transfer (Assistive device): Rolling walker Locomotion  Gait Ambulation: Yes Gait Assistance: Minimal Assistance - Patient > 75%;Contact Guard/Touching assist Gait Distance (Feet): 150 Feet Assistive device: Rolling walker Gait Assistance Details: Verbal cues for safe use of DME/AE;Verbal cues for technique;Verbal cues for sequencing;Verbal cues for gait pattern;Verbal cues for precautions/safety;Visual cues for safe use of DME/AE Gait Gait: Yes Gait Pattern: Impaired Gait Pattern: Poor foot clearance - left;Step-to pattern;Step-through pattern;Decreased hip/knee flexion - left;Decreased stance time - left (intermittent L LE step-to pattern with decreased foot  clearance) Gait velocity: decreased Stairs / Additional Locomotion Stairs: Yes Stairs Assistance: Minimal Assistance - Patient > 75%;Contact Guard/Touching assist Stair Management Technique: Two rails;Step to pattern;Forwards (step-to leading with R LE on ascent and L LE vs R LE on descent) Number of Stairs: 8 Height of Stairs: 6 Ramp: Minimal Assistance - Patient >75% (using RW with even less L LE foot clearance) Curb: Minimal Assistance - Patient >75% Wheelchair Mobility Wheelchair Mobility: No   Discharge Criteria: Patient will be discharged from PT if patient refuses treatment 3 consecutive times without medical reason, if treatment goals not met, if there is a change in medical status, if patient makes no progress towards goals or if patient is discharged from hospital.  The above assessment, treatment plan, treatment alternatives and goals were discussed and mutually agreed upon: by patient  Tawana Scale , PT, DPT, NCS, CSRS 05/23/2022, 12:15 PM

## 2022-05-23 NOTE — Progress Notes (Signed)
Inpatient Rehabilitation  Patient information reviewed and entered into eRehab system by Mai Longnecker M. Nitzia Perren, M.A., CCC/SLP, PPS Coordinator.  Information including medical coding, functional ability and quality indicators will be reviewed and updated through discharge.    

## 2022-05-23 NOTE — Discharge Summary (Signed)
Physician Discharge Summary  Patient ID: Rhenda Johanne Mcglade MRN: [id number] DOB/AGE: 71-14-1952 71 y.o.  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 Elevated D-dimer. COVID infection. Clinical suspicion for pulmonary embolism. EXAM: CT ANGIOGRAPHY CHEST WITH CONTRAST TECHNIQUE: Multidetector CT imaging of the chest was performed using the standard protocol during bolus administration of intravenous contrast. Multiplanar CT image reconstructions and MIPs were obtained to evaluate the vascular anatomy. RADIATION DOSE REDUCTION: This exam was performed according to the departmental dose-optimization program which includes automated exposure control, adjustment of the mA and/or kV according to patient size and/or use of iterative reconstruction technique. CONTRAST:  77m OMNIPAQUE IOHEXOL 350 MG/ML SOLN COMPARISON:  05/14/2022 FINDINGS: Cardiovascular: Satisfactory opacification of pulmonary arteries noted, and no pulmonary emboli identified. Stable small pseudoaneurysm or ductus diverticulum arising from the lateral aspect of the aortic arch, measuring 1.8 cm in diameter. Abundant atherosclerotic plaque seen throughout the aorta. Coronary atherosclerotic calcification also noted. Mediastinum/Nodes: No masses or pathologically enlarged lymph nodes  identified. Lungs/Pleura: No pulmonary mass, infiltrate, or effusion. Upper abdomen: No acute findings. Musculoskeletal: No suspicious bone lesions identified. Review of the MIP images confirms the above findings. IMPRESSION: No evidence of pulmonary embolism or other acute findings. 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 Aortic Atherosclerosis (ICD10-I70.0). Electronically Signed   By: JMarlaine HindM.D.   On: 05/27/2022 13:30   DG CHEST PORT 1 VIEW  Result Date: 05/26/2022 CLINICAL DATA:  Cough, CVA EXAM: PORTABLE CHEST 1 VIEW COMPARISON:  05/16/2022 chest radiograph. FINDINGS: Partially visualized surgical hardware from ACDF. Stable cardiomediastinal silhouette with normal heart size. No pneumothorax. No pleural effusion. Lungs appear clear, with no acute consolidative airspace disease and no pulmonary edema. IMPRESSION: No active disease. Electronically Signed   By: JIlona SorrelM.D.   On: 05/26/2022 15:22   [place]  Result Date: 05/18/2022 CLINICAL DATA:  [id number] creatinine EXAM: RENAL / URINARY TRACT ULTRASOUND COMPLETE COMPARISON:  Ultrasound abdomen complete dated May 23, 2017 FINDINGS: Right Kidney: Renal measurements: 9.7 x 4.6 x 4.6 cm = volume: 110 mL. Echogenicity within normal limits. Mild thinning of the renal cortex. No mass or hydronephrosis visualized. Left Kidney: Renal measurements: 8.1 x 3.4 x 4.1 cm = volume: 59 mL. There is some thinning of the renal cortex seen. No mass or hydronephrosis visualized. Bladder: Appears normal for degree of bladder distention. Other: None. IMPRESSION: Size of the kidneys is within normal limits for the patient's age. There is some thinning of the renal cortices seen greater on the left. No mass or hydronephrosis seen. Electronically Signed   By: AFrazier RichardsM.D.   On: 05/18/2022 10:13   DG Chest Port 1 View  Result Date: 05/16/2022 CLINICAL DATA:  Dyspnea. EXAM: PORTABLE CHEST  1 VIEW COMPARISON:  September 22, 2018 FINDINGS: The heart size and mediastinal contours are within normal limits. There is marked severity calcification of the thoracic aorta. Both lungs are clear.  A radiopaque fusion plate and screws are seen overlying the lower cervical spine. The visualized skeletal structures are unremarkable. IMPRESSION: No active cardiopulmonary disease. Electronically Signed   By: Virgina Norfolk M.D.   On: 05/16/2022 19:28   ECHOCARDIOGRAM COMPLETE  Result Date: 05/16/2022    ECHOCARDIOGRAM REPORT   Patient Name:   JADE BURKARD Date of Exam: 05/16/2022 Medical Rec #:  [id number]            Height:       63.0 in Accession #:    [id number]           Weight:       154.5 lb Date of Birth:  Apr 02, 1951            BSA:          1.733 m Patient Age:    58 years             BP:           108/47 mmHg Patient Gender: F                    HR:           51 bpm. Exam Location:  Inpatient Procedure: 2D Echo, Cardiac Doppler and Color Doppler Indications:    Stroke I63.9  History:        Patient has prior history of Echocardiogram examinations, most                 recent 10/14/2018. Previous Myocardial Infarction and CAD; Risk                 Factors:Hypertension and Dyslipidemia.  Sonographer:    Bernadene Person RDCS Referring Phys: [facility]  1. Left ventricular ejection fraction, by estimation, is 60 to 65%. The left ventricle has normal function. The left ventricle has no regional wall motion abnormalities. There is mild concentric left ventricular hypertrophy. Left ventricular diastolic parameters are indeterminate.  2. Right ventricular systolic function is normal. The right ventricular size is normal. There is normal pulmonary artery systolic pressure.  3. Right atrial size was mildly dilated.  4. The mitral valve is normal in structure. Trivial mitral valve regurgitation. No evidence of mitral stenosis.  5. The aortic valve is normal in structure. Aortic valve  regurgitation is not visualized. Aortic valve sclerosis is present, with no evidence of aortic valve stenosis.  6. The inferior vena cava is normal in size with greater than 50% respiratory variability, suggesting right atrial pressure of 3 mmHg. FINDINGS  Left Ventricle: Left ventricular ejection fraction, by estimation, is 60 to 65%. The left ventricle has normal function. The left ventricle has no regional wall motion abnormalities. The left ventricular internal cavity size was normal in size. There is  mild concentric left ventricular hypertrophy. Left ventricular diastolic parameters are indeterminate. Right Ventricle: The right ventricular size is normal. No increase in right ventricular wall thickness. Right ventricular systolic function is normal. There is normal pulmonary artery systolic pressure. The tricuspid regurgitant velocity is 2.36 m/s, and  with an assumed right atrial pressure of 3 mmHg, the estimated right ventricular systolic pressure is 16.0 mmHg. Left Atrium: Left atrial size was normal in size. Right Atrium: Right atrial size was mildly dilated. Pericardium: There is no evidence of pericardial effusion. Presence of epicardial fat layer. Mitral Valve: The mitral valve is normal in structure. Mild mitral annular calcification. Trivial mitral valve regurgitation. No evidence of mitral valve  stenosis. Tricuspid Valve: The tricuspid valve is normal in structure. Tricuspid valve regurgitation is not demonstrated. No evidence of tricuspid stenosis. Aortic Valve: The aortic valve is normal in structure. Aortic valve regurgitation is not visualized. Aortic valve sclerosis is present, with no evidence of aortic valve stenosis. Pulmonic Valve: The pulmonic valve was normal in structure. Pulmonic valve regurgitation is not visualized. No evidence of pulmonic stenosis. Aorta: The aortic root is normal in size and structure. Venous: The inferior vena cava is normal in size with greater than 50% respiratory  variability, suggesting right atrial pressure of 3 mmHg. IAS/Shunts: The interatrial septum appears to be lipomatous. No atrial level shunt detected by color flow Doppler.  LEFT VENTRICLE PLAX 2D LVIDd:         5.00 cm      Diastology LVIDs:         3.10 cm      LV e' medial:    6.83 cm/s LV PW:         1.00 cm      LV E/e' medial:  11.5 LV IVS:        1.20 cm      LV e' lateral:   7.73 cm/s LVOT diam:     2.00 cm      LV E/e' lateral: 10.2 LV SV:         89 LV SV Index:   51 LVOT Area:     3.14 cm  LV Volumes (MOD) LV vol d, MOD A2C: 115.0 ml LV vol d, MOD A4C: 108.0 ml LV vol s, MOD A2C: 41.1 ml LV vol s, MOD A4C: 38.7 ml LV SV MOD A2C:     73.9 ml LV SV MOD A4C:     108.0 ml LV SV MOD BP:      72.7 ml RIGHT VENTRICLE RV S prime:     17.30 cm/s TAPSE (M-mode): 2.4 cm LEFT ATRIUM             Index        RIGHT ATRIUM           Index LA diam:        4.00 cm 2.31 cm/m   RA Area:     20.20 cm LA Vol (A2C):   49.1 ml 28.33 ml/m  RA Volume:   53.70 ml  30.99 ml/m LA Vol (A4C):   46.5 ml 26.83 ml/m LA Biplane Vol: 52.1 ml 30.06 ml/m  AORTIC VALVE LVOT Vmax:   110.00 cm/s LVOT Vmean:  73.900 cm/s LVOT VTI:    0.282 m  AORTA Ao Root diam: 3.20 cm Ao Asc diam:  3.60 cm MITRAL VALVE                TRICUSPID VALVE MV Area (PHT): 2.77 cm     TR Peak grad:   22.3 mmHg MV Decel Time: 274 msec     TR Vmax:        236.00 cm/s MV E velocity: 78.60 cm/s MV A velocity: 101.00 cm/s  SHUNTS MV E/A ratio:  0.78         Systemic VTI:  0.28 m                             Systemic Diam: 2.00 cm Kardie Tobb DO Electronically signed by Berniece Salines DO Signature Date/Time: 05/16/2022/12:25:38 PM    Final    CT ANGIO HEAD W OR WO CONTRAST  Result Date: 05/15/2022 CLINICAL DATA:  Stroke follow-up EXAM: CT ANGIOGRAPHY HEAD TECHNIQUE: Multidetector CT imaging of the head was performed using the standard protocol during bolus administration of intravenous contrast. Multiplanar CT image reconstructions and MIPs were obtained to evaluate the  vascular anatomy. RADIATION DOSE REDUCTION: This exam was performed according to the departmental dose-optimization program which includes automated exposure control, adjustment of the mA and/or kV according to patient size and/or use of iterative reconstruction technique. CONTRAST:  28m OMNIPAQUE IOHEXOL 350 MG/ML SOLN COMPARISON:  Brain MRI obtained earlier the same day FINDINGS: CT HEAD Brain: Again seen is evolving acute to subacute infarct in the left occipital lobe. There is no evidence of hemorrhagic transformation. There is no evidence of new acute infarct. There is no acute intracranial hemorrhage or extra-axial fluid collection. Remote lacunar infarcts in the bilateral basal ganglia and thalami and background advanced chronic white matter microangiopathy are unchanged. The ventricles are stable in size. There is no mass lesion.  There is no mass effect or midline shift. Vascular: See below. Skull: Normal. Negative for fracture or focal lesion. Sinuses: Paranasal sinuses are clear. Other: Globes and orbits are unremarkable. CTA HEAD Anterior circulation: The imaged high cervical internal carotid arteries are patent. Intracranial internal carotid arteries are patent with scattered calcified plaque but no hemodynamically significant stenosis or occlusion. The bilateral MCAs are patent with mild atherosclerotic irregularity distally but no proximal high-grade stenosis or occlusion. The bilateral ACAs are patent without proximal high-grade stenosis or occlusion. The anterior communicating artery is normal. There is a 3 mm x 2 mm superiorly projecting aneurysm arising from the left paraclinoid ICA (12-74). There is an additional 1 mm inferiorly projecting outpouching arising from the communicating segment of the ICA which could reflect an additional tiny aneurysm (12-83). There is a 3 mm posterior inferiorly projecting outpouching arising from the right supraclinoid ICA suspicious for small aneurysm (13-84,  12-86). Posterior circulation: The bilateral V4 segments are patent. The left PICA origin is identified. The right PICA origin is not seen. The basilar artery is patent. The bilateral PCAs are patent with atherosclerotic irregularity distally but no proximal high-grade stenosis or occlusion. There is no aneurysm or AVM. Venous sinuses: Not well evaluated due to bolus timing. Anatomic variants: None. Review of the MIP images confirms the above findings. IMPRESSION: 1. Evolving infarct in the left occipital lobe without evidence of hemorrhagic transformation. No new acute intracranial pathology. 2. Patent intracranial vasculature with mild atherosclerotic irregularity of the distal branches but no proximal high-grade stenosis or occlusion. 3. 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 Electronically Signed   By: PValetta MoleM.D.   On: 05/15/2022 16:42   CT [place] VASC ACCESS RT NO REPORT  Result Date: 05/15/2022 There is no Radiologist interpretation  for this exam.  MR BRAIN WO CONTRAST  Result Date: 05/15/2022 CLINICAL DATA:  71year old female status post fall, head trauma. Possible small acute cortical infarct on CT. EXAM: MRI HEAD WITHOUT CONTRAST TECHNIQUE: Multiplanar, multiecho pulse sequences of the brain and surrounding structures were obtained without intravenous contrast. COMPARISON:  Head and cervical spine CT yesterday. FINDINGS: Brain: Restricted diffusion in the left superior occipital lobe along the parieto-occipital sulcus corresponding to the CT finding yesterday. Only mild associated T2 and FLAIR hyperintense cytotoxic edema. No hemorrhage or mass effect. No other No  restricted diffusion or evidence of acute infarction. There is a chronic microhemorrhage in the right occipital pole on SWI (series 14, image 25). Subtle chronic  linear infarct in the left cerebellum series 10, image 7. And numerous chronic lacunar infarcts scattered in the bilateral deep gray nuclei. No other chronic cerebral blood products. Additional bilateral cerebral white matter T2 and FLAIR hyperintensity. No midline shift, mass effect, evidence of mass lesion, ventriculomegaly, extra-axial collection or acute intracranial hemorrhage. Cervicomedullary junction and pituitary are within normal limits. Patchy and scattered Vascular: Major intracranial vascular flow voids are preserved, with some generalized intracranial artery dolichoectasia. Skull and upper cervical spine: Partially visible cervical ACDF hardware. Otherwise negative. Visualized bone marrow signal is within normal limits. Sinuses/Orbits: Negative orbits. Paranasal sinuses and mastoids are stable and well aerated. Other: Visible internal auditory structures appear normal. Negative visible scalp and face. IMPRESSION: 1. Confirmed acute to subacute Left PCA territory infarct in the left occipital lobe. No associated hemorrhage or mass effect. 2. Underlying Advanced chronic small vessel disease. No other acute intracranial abnormality. Electronically Signed   By: Genevie Ann M.D.   On: 05/15/2022 11:51   CT CHEST ABDOMEN PELVIS W CONTRAST  Addendum Date: 05/14/2022   ADDENDUM REPORT: 05/14/2022 14:45 ADDENDUM: Based on the location of the pseudoaneurysm is near the ligamentum arteriosum the possibility of acute injury to a pre-existing pseudoaneurysm is considered and acute aortic injury is possible. For this reason thoracic or vascular surgery evaluation is suggested as outlined in the initial report. These results were called by telephone at the time of interpretation on 05/14/2022 at 2:45 pm to provider Dr. Roderic Palau, Who verbally acknowledged these results. Dr. Roderic Palau also to the initial call for this patient as outlined in the initial report. Electronically Signed   By: Zetta Bills M.D.   On: 05/14/2022  14:45   Result Date: 05/14/2022 CLINICAL DATA:  A 71 year old female presents for evaluation of trauma post fall. EXAM: CT CHEST, ABDOMEN, AND PELVIS WITH CONTRAST TECHNIQUE: Multidetector CT imaging of the chest, abdomen and pelvis was performed following the standard protocol during bolus administration of intravenous contrast. RADIATION DOSE REDUCTION: This exam was performed according to the departmental dose-optimization program which includes automated exposure control, adjustment of the mA and/or kV according to patient size and/or use of iterative reconstruction technique. CONTRAST:  22m OMNIPAQUE IOHEXOL 350 MG/ML SOLN COMPARISON:  Previous imaging of the abdomen and pelvis. No dedicated imaging of the chest is available for comparison aside from previous cardiac imaging which was performed in December of 2019. FINDINGS: CT CHEST FINDINGS Cardiovascular: Calcified and noncalcified atheromatous plaque in the thoracic aorta. Focal outpouching from the aortic arch with subtle surrounding stranding, extending beyond confines of the thoracic aorta measuring 1.3 x 2.0 cm and without prior for comparison. Abundant calcified and noncalcified plaque with areas of marked plaque irregularity grossly similar to previous imaging with respect to imaged portions of the aorta. Second area of suspected penetrating atherosclerotic ulcer or developing pseudoaneurysm noted on image 36/3 but without surrounding stranding. Also seen on a image 59 of series 3 along the superior aspect of the thoracic aorta is a similar focal outpouching also without surrounding stranding. No signs of aortic dissection or additional suspected or potential acute process. Central pulmonary vessels are normal caliber. Mediastinum/Nodes: Mild stranding about the suspected pseudoaneurysm/penetrating ulcer that extends from the LEFT lateral aspect of the thoracic aorta. No thoracic inlet lymphadenopathy. No axillary lymphadenopathy. No mediastinal  lymphadenopathy. No hilar lymphadenopathy. Esophagus is grossly normal.  Lungs/Pleura: No consolidation. No pleural effusion. No pneumothorax. PICC PICC basilar atelectasis. Airways are patent. Musculoskeletal: No contusion over the body wall. See dedicated musculoskeletal section below for further detail. CT ABDOMEN PELVIS FINDINGS Hepatobiliary: Increasing biliary duct distension since previous imaging from 2021 both intra and extrahepatic biliary duct distension. Transition is at the level of the ampulla. No discrete abnormality is noted to explain these findings. Common bile duct is 14 mm greatest axial dimension which is similar though intrahepatic biliary duct distension is moderate and increased since previous imaging. Pancreas: Pancreatic atrophy without inflammation or visible lesion. No ductal dilation. Spleen: Normal. Adrenals/Urinary Tract: Adrenal glands are normal. Normal RIGHT kidney. Atrophy of the upper pole the LEFT kidney. No hydronephrosis or suspicious renal lesion. Smooth contour the urinary bladder. Stomach/Bowel: Query thickening of the ascending colon. No adjacent stranding. Sigmoid diverticulosis and diverticular changes without adjacent stranding. Appendix not visualized, no secondary signs to suggest acute appendicitis. No acute gastric or small bowel process. Vascular/Lymphatic: Aortic atherosclerosis both calcified and noncalcified in the abdominal aorta. No surrounding stranding. No aneurysmal dilation of the abdominal aorta. Reproductive: Post hysterectomy.  No adnexal masses. Other: No ascites. Musculoskeletal: Sclerosis along the RIGHT hemi sacrum. This shows a sub chondral bandlike distribution and was not present on previous imaging. The sacroiliac joints are intact. Symphysis pubis is intact. No sign of displaced fracture. No sign of displaced rib fracture.  Sternum is intact. Signs of L4-5 spinal fusion with grade 1 anterolisthesis of L4 on L5. Anterolisthesis new from imaging in  2021 and perhaps mildly increased compared to imaging from December of 2022, not changed compared to recent postoperative imaging and associated with interbody fusion. Small amount of fluid in the subcutaneous fat overlying the surgical site (image 84/3) 3 x 2.2 cm. The IMPRESSION: 1. 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 Based on appearance thoracic or vascular surgery consultation is suggested particularly if there is chest pain to exclude acute aortic syndrome relating to pseudoaneurysm. Etiology of this finding may relate to underlying penetrating atherosclerotic ulcer based on morphology and presence of similar smaller areas that are seen in the aorta without surrounding stranding. 2. 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 3. Irregular and abundant soft plaque in the thoracic and abdominal aorta could be a risk for distal embolization. 4. Thickening of the ascending and transverse colon may reflect findings of colitis other previous or mild to moderate current colitis. There is no adjacent stranding or free fluid in the pelvis. Correlate with severity of abdominal trauma and consider repeat imaging if there is worsening of symptoms. Would also correlate with any symptoms of colitis or recent colitis. 5. Findings of RIGHT sacral insufficiency fracture. 6. Recent postoperative changes related to lumbar spinal fusion. Small nonspecific fluid in the subcutaneous fat. This potentially represents seroma should be correlated with any worsening symptoms in this location or signs infection. 7. Increasing biliary duct distension now with moderate intrahepatic biliary duct distension in similar common bile duct distension without clear cause. Correlate with any symptoms of biliary obstruction or RIGHT upper quadrant  pain with further imaging on follow-up as warranted. These results were called by telephone at the time of interpretation on 05/14/2022 at 2:26 pm to provider [facility] , who verbally acknowledged these results. Electronically Signed: By: Zetta Bills M.D. On: 05/14/2022 14:26  CT T-SPINE NO CHARGE  Result Date: 05/14/2022 CLINICAL DATA:  Left leg pain after fall. Recent lower back surgery 2 weeks ago. EXAM: CT THORACIC AND LUMBAR SPINE WITHOUT CONTRAST TECHNIQUE: Multidetector CT imaging of the thoracic and lumbar spine was performed without intravenous contrast. Multiplanar CT image reconstructions were also generated. RADIATION DOSE REDUCTION: This exam was performed according to the departmental dose-optimization program which includes automated exposure control, adjustment of the mA and/or kV according to patient size and/or use of iterative reconstruction technique. COMPARISON:  Lumbar spine x-rays dated [facility] 30, 2023. CT lumbar myelogram dated October 25, 2021. FINDINGS: CT THORACIC SPINE FINDINGS Alignment: Slight dextrocurvature.  No significant listhesis. Vertebrae: No acute fracture or focal pathologic process. Paraspinal and other soft tissues: Please see separate CT chest report from same day. Disc levels: Disc heights are preserved. Scattered facet arthropathy. No significant spinal canal or neuroforaminal stenosis. CT LUMBAR SPINE FINDINGS Segmentation: 5 lumbar type vertebrae. Alignment: No traumatic malalignment. Unchanged 8 mm anterolisthesis at L4-L5. Vertebrae: L4-L5 PLIF. No evidence of hardware failure or loosening. No acute fracture or focal pathologic process. Paraspinal and other soft tissues: Subacute appearing fracture of the right sacral ala with surrounding sclerosis, new since December. Disc levels: Unchanged mild disc bulging at L3-L4 and moderate disc bulging at L5-S1. L4-L5 PLIF with bilateral laminectomies. Unchanged moderate to severe right and mild left facet  arthropathy at L5-S1. No significant spinal canal or neuroforaminal stenosis at any level. IMPRESSION: 1. No acute osseous abnormality of the thoracic or lumbar spine. 2. Subacute appearing fracture of the right sacral ala with surrounding sclerosis, new since December. 3. L4-L5 PLIF without evidence of hardware complication. Electronically Signed   By: Titus Dubin M.D.   On: 05/14/2022 14:06   CT L-SPINE NO CHARGE  Result Date: 05/14/2022 CLINICAL DATA:  Left leg pain after fall. Recent lower back surgery 2 weeks ago. EXAM: CT THORACIC AND LUMBAR SPINE WITHOUT CONTRAST TECHNIQUE: Multidetector CT imaging of the thoracic and lumbar spine was performed without intravenous contrast. Multiplanar CT image reconstructions were also generated. RADIATION DOSE REDUCTION: This exam was performed according to the departmental dose-optimization program which includes automated exposure control, adjustment of the mA and/or kV according to patient size and/or use of iterative reconstruction technique. COMPARISON:  Lumbar spine x-rays dated [place] 30, 2023. CT lumbar myelogram dated October 25, 2021. FINDINGS: CT THORACIC SPINE FINDINGS Alignment: Slight dextrocurvature.  No significant listhesis. Vertebrae: No acute fracture or focal pathologic process. Paraspinal and other soft tissues: Please see separate CT chest report from same day. Disc levels: Disc heights are preserved. Scattered facet arthropathy. No significant spinal canal or neuroforaminal stenosis. CT LUMBAR SPINE FINDINGS Segmentation: 5 lumbar type vertebrae. Alignment: No traumatic malalignment. Unchanged 8 mm anterolisthesis at L4-L5. Vertebrae: L4-L5 PLIF. No evidence of hardware failure or loosening. No acute fracture or focal pathologic process. Paraspinal and other soft tissues: Subacute appearing fracture of the right sacral ala with surrounding sclerosis, new since December. Disc levels: Unchanged mild disc bulging at L3-L4 and moderate disc bulging  at L5-S1. L4-L5 PLIF with bilateral laminectomies. Unchanged moderate to severe right and mild left facet arthropathy at L5-S1. No significant spinal canal or neuroforaminal stenosis at any level. IMPRESSION: 1. No acute osseous abnormality of the thoracic or lumbar spine. 2. Subacute appearing fracture of the right sacral ala with surrounding sclerosis, new since December. 3. L4-L5 PLIF without evidence of hardware complication. Electronically Signed   By: Titus Dubin M.D.   On: 05/14/2022 14:06  CT CERVICAL SPINE WO CONTRAST  Result Date: 05/14/2022 CLINICAL DATA:  Poly trauma, blunt.  Fell on the porch last night. EXAM: CT CERVICAL SPINE WITHOUT CONTRAST TECHNIQUE: Multidetector CT imaging of the cervical spine was performed without intravenous contrast. Multiplanar CT image reconstructions were also generated. RADIATION DOSE REDUCTION: This exam was performed according to the departmental dose-optimization program which includes automated exposure control, adjustment of the mA and/or kV according to patient size and/or use of iterative reconstruction technique. COMPARISON:  None FINDINGS: Alignment: Straightening of the normal cervical lordosis. Degenerative type anterolisthesis at C4-5 of 2 mm. Skull base and vertebrae: Distant ACDF C5-C7. No evidence of regional fracture. Soft tissues and spinal canal: No traumatic soft tissue finding. Disc levels: Ordinary osteoarthritis at the C1-2 articulation. C2-3 facet osteoarthritis without significant stenosis. C3-4 facet osteoarthritis and small uncovertebral osteophytes. Foraminal narrowing on the right that could possibly be symptomatic. C4-5 facet arthropathy with 2 mm of degenerative anterolisthesis. Bony foraminal narrowing on the right that could possibly be symptomatic. Solid union from C5 through C7 with wide patency of the canal and foramina. Some chronic bony foraminal narrowing at C5-6, not likely compressive. No significant C7-T1 finding. Upper  chest: Negative Other: None IMPRESSION: No acute or traumatic finding. Chronic ACDF C5 through C7 with solid union and sufficient patency of the canal and foramina at those levels. C4-5 facet arthropathy with 2 mm of degenerative anterolisthesis. Bony foraminal narrowing on the right at C3-4 and C4-5. Chronic bony foraminal narrowing at C5-6. Electronically Signed   By: Nelson Chimes M.D.   On: 05/14/2022 13:53   CT HEAD WO CONTRAST  Result Date: 05/14/2022 CLINICAL DATA:  Head trauma, moderate-severe. Fell on the porch last night EXAM: CT HEAD WITHOUT CONTRAST TECHNIQUE: Contiguous axial images were obtained from the base of the skull through the vertex without intravenous contrast. RADIATION DOSE REDUCTION: This exam was performed according to the departmental dose-optimization program which includes automated exposure control, adjustment of the mA and/or kV according to patient size and/or use of iterative reconstruction technique. COMPARISON:  09/12/2018 FINDINGS: Brain: No focal abnormality seen affecting the brainstem or cerebellum. Cerebral hemispheres show chronic small-vessel ischemic changes throughout the white matter, progressive since 2019. Old appearing small vessel infarctions of the right putamen and left caudate. Question acute/subacute infarction of the medial left parietooccipital junction. No hemorrhage, hydrocephalus or extra-axial collection. Vascular: There is atherosclerotic calcification of the major vessels at the base of the brain. Skull: Negative Sinuses/Orbits: Clear/normal Other: None IMPRESSION: No acute traumatic finding. Chronic small-vessel ischemic changes of the cerebral hemispheric white matter and basal ganglia, progressive since 2019. Suspicion of small acute cortical infarction in the medial left parietooccipital junction region without evidence of mass effect or hemorrhage. Consider confirmation with MRI. Electronically Signed   By: Nelson Chimes M.D.   On: 05/14/2022 13:48    DG Hip Unilat W or Wo Pelvis 2-3 Views Right  Result Date: 05/14/2022 CLINICAL DATA:  Fall, left lower extremity pain EXAM: DG HIP (WITH OR WITHOUT PELVIS) 2-3V RIGHT COMPARISON:  None Available. FINDINGS: No right hip fracture or dislocation. Small superior right greater trochanter enthesophytes. No significant right hip arthropathy. No suspicious focal osseous lesions. No radiopaque foreign bodies. IMPRESSION: No right hip fracture or dislocation. Electronically Signed   By: Ilona Sorrel M.D.   On: 05/14/2022 12:02   DG Hip Unilat W or Wo Pelvis 2-3 Views Left  Result Date: 05/14/2022 CLINICAL DATA:  Fall, left lower extremity pain EXAM: DG HIP (WITH  OR WITHOUT PELVIS) 2-3V LEFT COMPARISON:  None Available. FINDINGS: No pelvic fracture or diastasis. No left hip fracture or dislocation. No suspicious focal osseous lesions. Bilateral posterior spinal fusion hardware at L4-5. No significant left hip arthropathy. IMPRESSION: No left hip fracture or malalignment. Electronically Signed   By: Ilona Sorrel M.D.   On: 05/14/2022 12:01    Labs:  Basic Metabolic Panel: Recent Labs  Lab 05/22/22 0358 05/23/22 0724 05/26/22 1255 05/26/22 2254 05/27/22 0746 05/28/22 0256  NA 134* 137 135  --  134* 131*  K 3.7 4.5 3.1*  --  3.4* 3.4*  CL 107 105 98  --  101 100  CO2 21* 24 20*  --  19* 17*  GLUCOSE 94 104* 101*  --  94 94  BUN '19 16 18  '$ --  16 18  CREATININE 1.72* 1.75* 1.81*  --  1.72* 1.92*  CALCIUM 8.8* 9.5 9.4  --  8.9 8.9  MG  --   --   --  2.2  --  2.0    CBC: Recent Labs  Lab 05/23/22 0724 05/26/22 1255 05/28/22 0256  WBC 9.0 8.3 9.0  NEUTROABS 6.3 7.1 7.2  HGB 10.0* 10.5* 10.4*  HCT 30.6* 30.7* 31.1*  MCV 99.4 95.6 95.4  PLT 316 254 241    CBG: Recent Labs  Lab 05/26/22 1636 05/27/22 0759 05/27/22 1207  GLUCAP 122* 96 92    Brief HPI:   Sacheen Elysha Daw is a 71 y.o. female who had recently undergone lumbar surgery on 6/15 and fell at home.  She was brought to the  emergency department and evaluated for possible stroke.  MRI revealed left PCA infarct.   Hospital Course: Litsy Karryn Kosinski was admitted to rehab 05/22/2022 for inpatient therapies to consist of PT, ST and OT at least three hours five days a week. Past admission physiatrist, therapy team and rehab RN have worked together to provide customized collaborative inpatient rehab.  Admission, she complained of lower abdominal pain secondary to heparin injections.  This was changed to Lovenox once daily.  She also complained of low back pain.  On 7/13, she complained of pain radiating to her left gluteus and thigh which was similar to the pain she experienced prior to surgery.  She stated she had tried gabapentin, Lyrica, Cymbalta and amitriptyline past without good results.  On 7/14 the patient asked that Dr. Ellene Route be notified and his office was contacted for consultation.  On 7/15 she complained of body aches and low-grade fever to 99.7.  She also endorsed chills.  Work-up for respiratory illness including viral labs, CBC and BMP ordered.  Temperature increased to 100.1 and again to 102.6.  Respiratory panel was significant for SARS coronavirus 2 positivity.  Hospitalist service was notified.  At approximately 4:36 PM the patient demonstrated delusions and rapid response was called.  CBG, lactic acid drawn.  Temperature at that time was 100.9.  She was transferred to acute care bed.   Blood pressures were monitored on TID basis and remained stable on chlorthalidone 25 mg, amlodipine 10 mg, clonidine 0.1 mg and hydralazine 100 mg 3 times daily.   Rehab course: During patient's stay in rehab weekly team conferences were held to monitor patient's progress, set goals and discuss barriers to discharge. At admission, patient required min assist for transfers and mobility and mild to moderate impairments, primary deficit of reduced sustained attention along with problem solving.  She required min assist with  ADLs.   Disposition:  Discharge disposition: [facility] Not Defined        Diet: Heart healthy  Special Instructions:  No driving, alcohol consumption or tobacco use.   30-35 minutes were spent on discharge planning and discharge summary.  Allergies as of 05/26/2022       Reactions   Flexeril [cyclobenzaprine] Anaphylaxis, Hives, Itching, Swelling   Lidocaine Hives   Zanaflex [tizanidine] Anaphylaxis, Hives, Itching, Swelling   Gabapentin Swelling   Swelling and itching   Latex Rash        Medication List     ASK your doctor about these medications    ALPRAZolam 1 MG tablet Commonly known as: XANAX Take 0.5 mg by mouth daily as needed for sleep or anxiety.   amLODipine 10 MG tablet Commonly known as: NORVASC TAKE 1 TABLET(10 MG) BY MOUTH DAILY   aspirin EC 81 MG tablet Take 81 mg by mouth as needed (chest pain). Swallow whole.   atorvastatin 40 MG tablet Commonly known as: LIPITOR Take 1 tablet (40 mg total) by mouth daily.   cloNIDine 0.1 MG tablet Commonly known as: CATAPRES Take 1 tablet (0.1 mg total) by mouth at bedtime.   clopidogrel 75 MG tablet Commonly known as: PLAVIX Take 1 tablet (75 mg total) by mouth daily for 16 days.   ezetimibe 10 MG tablet Commonly known as: ZETIA TAKE 1 TABLET(10 MG) BY MOUTH DAILY   hydrALAZINE 100 MG tablet Commonly known as: APRESOLINE Take 1 tablet (100 mg total) by mouth 3 (three) times daily.   methocarbamol 500 MG tablet Commonly known as: ROBAXIN Take 1 tablet (500 mg total) by mouth every 6 (six) hours as needed for muscle spasms.   nystatin 100000 UNIT/ML suspension Commonly known as: MYCOSTATIN Take 5 mLs (500,000 Units total) by mouth 4 (four) times daily.   oxyCODONE-acetaminophen 5-325 MG tablet Commonly known as: PERCOCET/ROXICET Take 1 tablet by mouth every 6 (six) hours as needed for moderate pain or severe pain.   polyethylene glycol powder 17 GM/SCOOP  powder Commonly known as: GLYCOLAX/MIRALAX Take 17 g by mouth daily as needed.   topiramate 25 MG tablet Commonly known as: TOPAMAX Take 1 tablet (25 mg total) by mouth 2 (two) times daily.   TUMS PO Take 2 tablets by mouth daily as needed (stomach pain).   valACYclovir 500 MG tablet Commonly known as: VALTREX TAKE 1 TABLET BY MOUTH TWICE DAILY FOR 3 TO 5 DAYS THEN TAKE DAILY AS NEEDED   vitamin C 1000 MG tablet Take 1,000 mg by mouth in the morning.   Vitamin D 50 MCG (2000 UT) tablet Take 2,000 Units by mouth in the morning.         Signed: Barbie Banner 05/28/2022, 12:26 PM

## 2022-05-23 NOTE — Progress Notes (Signed)
[facility] Individual Statement of Services  Patient Name:  Robin Arellano  Date:  05/23/2022  Welcome to the [facility].  Our goal is to provide you with an individualized program based on your diagnosis and situation, designed to meet your specific needs.  With this comprehensive rehabilitation program, you will be expected to participate in at least 3 hours of rehabilitation therapies Monday-Friday, with modified therapy programming on the weekends.  Your rehabilitation program will include the following services:  Physical Therapy (PT), Occupational Therapy (OT), Speech Therapy (ST), 24 hour per day rehabilitation nursing, Therapeutic Recreaction (TR), Care Coordinator, Rehabilitation Medicine, Nutrition Services, and Pharmacy Services  Weekly team conferences will be held on Wednesday to discuss your progress.  Your Inpatient Rehabilitation Care Coordinator will talk with you frequently to get your input and to update you on team discussions.  Team conferences with you and your family in attendance may also be held.  Expected length of stay: 8-11 days  Overall anticipated outcome: supervision-independent level with cues  Depending on your progress and recovery, your program may change. Your Inpatient Rehabilitation Care Coordinator will coordinate services and will keep you informed of any changes. Your Inpatient Rehabilitation Care Coordinator's name and contact numbers are listed  below.  The following services may also be recommended but are not provided by the [facility]:   [place] will be made to provide these services after discharge if needed.  Arrangements include referral to agencies that provide these services.  Your insurance has been verified to be:  [employer] Your primary doctor is:  Sandi Mariscal  Pertinent information  will be shared with your doctor and your insurance company.  Inpatient Rehabilitation Care Coordinator:  Ovidio Kin, [place] or [phone]  Information discussed with and copy given to patient by: [name], 05/23/2022, 10:12 AM

## 2022-05-23 NOTE — Progress Notes (Signed)
Initial Nutrition Assessment  DOCUMENTATION CODES:   Not applicable  INTERVENTION:   Multivitamin w/ minerals daily Encourage good PO intake Encouraged family to bring in snacks  NUTRITION DIAGNOSIS:   Inadequate oral intake related to decreased appetite as evidenced by meal completion < 25%, per patient/family report.  GOAL:   Patient will meet greater than or equal to 90% of their needs  MONITOR:   PO intake, Labs, Weight trends, I & O's  REASON FOR ASSESSMENT:   Malnutrition Screening Tool    ASSESSMENT:   71 y.o. female admitted to CIR after L PCA stroke and recent back surgery. PMH includes HTN, CAD, and fibromyalgia.   Pt resting in bed, family at bedside.  Pt reports that her appetite was great PTA. Shares that her favorite foods are Fritos and cucumbers with vinegar. Reports that her appetite has been poor here due to the food and not liking it. RD encouraged pt family to bring in snacks to help. Pt reports that she does not like Ensure or Boost.  Per EMR, pt ate 25% of dinner yesterday and 10% of breakfast today.   Pt reports that she has lost 5# sine [place] after having surgery on her back. Per EMR, pt has not had any significant weight loss recently. Reports that she has been using a walker since her surgery.   Medications reviewed and include: [place], Miralax, Senokot Labs reviewed.   NUTRITION - FOCUSED PHYSICAL EXAM:  Flowsheet Row Most Recent Value  Orbital Region No depletion  Upper Arm Region No depletion  Thoracic and Lumbar Region No depletion  Buccal Region No depletion  Temple Region No depletion  Clavicle Bone Region Mild depletion  Clavicle and Acromion Bone Region Mild depletion  Scapular Bone Region Mild depletion  Dorsal Hand Mild depletion  Patellar Region No depletion  Anterior Thigh Region Mild depletion  Posterior Calf Region No depletion  Edema (RD Assessment) None  Hair Reviewed  Eyes Reviewed  Mouth Reviewed  Skin Reviewed   Nails Reviewed   Diet Order:   Diet Order             Diet 2 gram sodium Room service appropriate? Yes; Fluid consistency: Thin  Diet effective now                   EDUCATION NEEDS:   No education needs have been identified at this time  Skin:  Skin Assessment: Reviewed RN Assessment  Last BM:  7/11  Height:   Ht Readings from Last 1 Encounters:  05/22/22 5' 2.99" (1.6 m)    Weight:   Wt Readings from Last 1 Encounters:  05/22/22 71 kg    Ideal Body Weight:  52.3 kg  BMI:  Body mass index is 27.73 kg/m.  Estimated Nutritional Needs:   Kcal:  1800-2000  Protein:  90-105 grams  Fluid:  >/= 1.8 L    Hermina Barters RD, LDN Clinical Dietitian See [facility] for contact information.

## 2022-05-23 NOTE — Evaluation (Signed)
Speech Language Pathology Assessment and Plan  Patient Details  Name: Robin Arellano MRN: [id number] Date of Birth: 1951-03-01  SLP Diagnosis: Cognitive Impairments;Speech and Language deficits  Rehab Potential: Good ELOS: 7-10    Today's Date: 05/23/2022 SLP Individual Time: 1000-1059 SLP Individual Time Calculation (min): 32 min   Hospital Problem: Principal Problem:   Acute left PCA stroke ([facility])  Past Medical History:  Past Medical History:  Diagnosis Date   Anxiety    on meds   Back pain    Chronic female pelvic pain    Coronary artery disease    mild, non-obstructive 11/2018   Depression    on meds   Family history of adverse reaction to anesthesia    sister had difficulty waking up   Fibromyalgia    H/O leukocytosis    Headache    Hyperlipidemia    on meds   Hypertension    on meds   MI (myocardial infarction) ([place])    Pt states she did not have a MI- EKG was normal, was GERD   Osteoarthritis    on meds   Ovarian cyst, right    PONV (postoperative nausea and vomiting)    Post-operative nausea and vomiting    SVD (spontaneous vaginal delivery)    x 2   Vitamin D deficiency    Past Surgical History:  Past Surgical History:  Procedure Laterality Date   ABDOMINAL HYSTERECTOMY  1994   TAH.BSO   ANTERIOR CERVICAL DECOMP/DISCECTOMY FUSION  2019   APPENDECTOMY  1975   BACK SURGERY  2023   BIOPSY  05/12/2020   Procedure: BIOPSY;  Surgeon: Milus Banister, MD;  [facility];  Service: Endoscopy;;   CARDIAC CATHETERIZATION  2020   CHOLECYSTECTOMY N/A 07/21/2020   Procedure: LAPAROSCOPIC CHOLECYSTECTOMY WITH INTRAOPERATIVE CHOLANGIOGRAM;  Surgeon: [name], MD;  Location: [place];  Service: General;  Laterality: N/A;   COLONOSCOPY  08/12/2017   Hx TA (piecemeal)Jacobs-MAC-suprep (good)   ESOPHAGOGASTRODUODENOSCOPY (EGD) WITH PROPOFOL N/A 05/12/2020   Procedure: ESOPHAGOGASTRODUODENOSCOPY (EGD) WITH PROPOFOL;  Surgeon: Milus Banister, MD;   [facility];  Service: Endoscopy;  Laterality: N/A;   EUS N/A 05/12/2020   Procedure: UPPER ENDOSCOPIC ULTRASOUND (EUS) RADIAL;  Surgeon: Milus Banister, MD;  [facility];  Service: Endoscopy;  Laterality: N/A;   KNEE SURGERY Bilateral 1996   x 2 - arthroscopic   LEFT HEART CATH AND CORONARY ANGIOGRAPHY N/A 11/25/2018   Procedure: LEFT HEART CATH AND CORONARY ANGIOGRAPHY;  Surgeon: Troy Sine, MD;  Location: [facility];  Service: Cardiovascular;  Laterality: N/A;   PELVIC LAPAROSCOPY  1989   W LYSIS OF ADHESIONS/L SALPINGONEOSTOMY   TUBAL LIGATION     WISDOM TOOTH EXTRACTION      Assessment / Plan / Recommendation Clinical Impression  Vanessa Haslem is a 71 year old female who presented to the emergency department on 05/14/2022 after sustaining a fall at approximately 2 AM.  She had recently undergone back surgery and had taken a Robaxin for left leg pain.  She had stopped aspirin therapy prior to surgery and had not restarted it.  She fell backwards and denied loss of consciousness, headache or dizziness.  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  Cardiothoracic surgery consulted.  Admitted to ICU and started on esmolol for hypertensive emergency.  Vascular surgery was also consulted to evaluate her  descending thoracic aortic disease.  She was seen by Dr. Stanford Breed on 7/4 with plans to repeat CT angiogram of chest abdomen and pelvis in 48 hours.  This was delayed due to acute kidney injury.  Arrangements made to perform this as outpatient.  No surgical intervention advised during hospitalization.  CT of the head revealed suspicious acute left infarct and MRI of the brain confirmed an acute/subacute left PCA infarct and neurology was consulted for stroke work-up.  Plavix and aspirin started on 7/5 for 3 weeks then aspirin alone.  She is  tolerating heart healthy diet. The patient requires inpatient physical medicine and rehabilitation evaluations and treatment secondary to dysfunction due to all deficits secondary to acute left PCA infarct and recent back surgery.  Pt presents with mild-moderate impairments, primary deficit of reduced sustained attention along with deficits in basic-mildly complex problem solving, emergent awareness, short term recall and higher level word finding. Pt is verbose and demonstrates sustained attention at the 2-3 minute interval, impacted by internal distractions. Pt scored mild impairments on cognitive linguistic assessment CLQT, in attention, executive function and visuospatial awareness with language and memory WFL (however lowest score in [facility] range.) Pt demonstrated moderate right inattention and moderate error awareness with no ability to self-correct errors. Pt demonstrates ability to express wants/needs, name objects in black/white pictures, but demonstrated moderate deficits in divergent naming further impacted by attention. Pt supports no deficits in swallow function. Pt would benefit from skilled ST services in order to maximize functional independence and reduce burden of care, requiring 24 hour supervision at discharge with continued skilled ST services.    Skilled Therapeutic Interventions          Skilled ST services focused on cognitive skills. SLP facilitated administration of cognitive linguistic formal assessment and provided education of results. SLP and pt collaborated to set goals for cognitive linguistic needs during length of stay. All questions answered to satisfaction.  Pt was left in room with call bell within reach and bed alarm set. SLP recommends to continue skilled services.    SLP Assessment  Patient will need skilled Speech Lanaguage Pathology Services during CIR admission    Recommendations  Medication Administration: Whole meds with liquid Supervision: Patient able to self  feed Compensations: Minimize environmental distractions Postural Changes and/or Swallow Maneuvers: Seated upright 90 degrees Oral Care Recommendations: Patient independent with oral care Recommendations for Other Services: Neuropsych consult Patient destination: Home Follow up Recommendations: 24 hour supervision/assistance;Outpatient SLP Equipment Recommended: None recommended by SLP    SLP Frequency 3 to 5 out of 7 days   SLP Duration  SLP Intensity  SLP Treatment/Interventions 7-10  Minumum of 1-2 x/day, 30 to 90 minutes  Cognitive remediation/compensation;Cueing hierarchy;Functional tasks;Medication managment;Patient/family education;Internal/external aids    Pain Pain Assessment Pain Scale: 0-10 Pain Score: 4  Pain Type: Acute pain Pain Location: Back Pain Orientation: Mid Pain Descriptors / Indicators: Aching;Sharp Pain Frequency: Constant Pain Onset: On-going Pain Intervention(s): Medication (See eMAR)  Prior Functioning Cognitive/Linguistic Baseline: Information not available Type of Home: House  Lives With: Spouse;Daughter Available Help at Discharge: Family;Available 24 hours/day Vocation: Retired  Programmer, systems Overall Cognitive Status: Impaired/Different from baseline Arousal/Alertness: Awake/alert Orientation Level: Oriented X4 Attention: Sustained Sustained Attention: Impaired Sustained Attention Impairment: Verbal basic;Functional basic Memory: Impaired Memory Impairment: Retrieval deficit;Storage deficit Awareness Impairment: Emergent impairment Problem Solving: Impaired Problem Solving Impairment: Verbal basic;Functional basic Safety/Judgment: Impaired  Comprehension Auditory Comprehension Overall Auditory Comprehension: Appears within functional limits for tasks assessed Expression Expression Primary Mode of  Expression: Verbal Verbal Expression Overall Verbal Expression: Appears within functional limits for tasks  assessed Written Expression Dominant Hand: Right Oral Motor Oral Motor/Sensory Function Overall Oral Motor/Sensory Function: Within functional limits Motor Speech Overall Motor Speech: Appears within functional limits for tasks assessed  Care Tool Care Tool Cognition Ability to hear (with hearing aid or hearing appliances if normally used Ability to hear (with hearing aid or hearing appliances if normally used): 0. Adequate - no difficulty in normal conservation, social interaction, listening to TV   Expression of Ideas and Wants Expression of Ideas and Wants: 3. Some difficulty - exhibits some difficulty with expressing needs and ideas (e.g, some words or finishing thoughts) or speech is not clear   Understanding Verbal and Non-Verbal Content Understanding Verbal and Non-Verbal Content: 3. Usually understands - understands most conversations, but misses some part/intent of message. Requires cues at times to understand  Memory/Recall Ability Memory/Recall Ability : Location of own room;That he or she is in a hospital/hospital unit    Short Term Goals: Week 1: SLP Short Term Goal 1 (Week 1): STG=LTG due to short ELOS (7-10 days)  Refer to Care Plan for Long Term Goals  Recommendations for other services: Neuropsych  Discharge Criteria: Patient will be discharged from SLP if patient refuses treatment 3 consecutive times without medical reason, if treatment goals not met, if there is a change in medical status, if patient makes no progress towards goals or if patient is discharged from hospital.  The above assessment, treatment plan, treatment alternatives and goals were discussed and mutually agreed upon: by patient  Man Effertz  [facility] 05/23/2022, 12:30 PM

## 2022-05-23 NOTE — Plan of Care (Signed)
  Problem: RH Balance Goal: LTG Patient will maintain dynamic sitting balance (PT) Description: LTG:  Patient will maintain dynamic sitting balance with assistance during mobility activities (PT) Flowsheets (Taken 05/23/2022 2257) LTG: Pt will maintain dynamic sitting balance during mobility activities with:: Independent with assistive device  Goal: LTG Patient will maintain dynamic standing balance (PT) Description: LTG:  Patient will maintain dynamic standing balance with assistance during mobility activities (PT) Flowsheets (Taken 05/23/2022 2257) LTG: Pt will maintain dynamic standing balance during mobility activities with:: Supervision/Verbal cueing   Problem: Sit to Stand Goal: LTG:  Patient will perform sit to stand with assistance level (PT) Description: LTG:  Patient will perform sit to stand with assistance level (PT) Flowsheets (Taken 05/23/2022 2257) LTG: PT will perform sit to stand in preparation for functional mobility with assistance level: Independent with assistive device   Problem: RH Bed Mobility Goal: LTG Patient will perform bed mobility with assist (PT) Description: LTG: Patient will perform bed mobility with assistance, with/without cues (PT). Flowsheets (Taken 05/23/2022 2257) LTG: Pt will perform bed mobility with assistance level of: Independent with assistive device    Problem: RH Bed to Chair Transfers Goal: LTG Patient will perform bed/chair transfers w/assist (PT) Description: LTG: Patient will perform bed to chair transfers with assistance (PT). Flowsheets (Taken 05/23/2022 2257) LTG: Pt will perform Bed to Chair Transfers with assistance level: Independent with assistive device    Problem: RH Car Transfers Goal: LTG Patient will perform car transfers with assist (PT) Description: LTG: Patient will perform car transfers with assistance (PT). Flowsheets (Taken 05/23/2022 2257) LTG: Pt will perform car transfers with assist:: Supervision/Verbal cueing    Problem: RH Ambulation Goal: LTG Patient will ambulate in controlled environment (PT) Description: LTG: Patient will ambulate in a controlled environment, # of feet with assistance (PT). Flowsheets (Taken 05/23/2022 2257) LTG: Pt will ambulate in controlled environ  assist needed:: Supervision/Verbal cueing LTG: Ambulation distance in controlled environment: 163f using LRAD Goal: LTG Patient will ambulate in home environment (PT) Description: LTG: Patient will ambulate in home environment, # of feet with assistance (PT). Flowsheets (Taken 05/23/2022 2257) LTG: Pt will ambulate in home environ  assist needed:: Supervision/Verbal cueing LTG: Ambulation distance in home environment: 538fusing LRAD   Problem: RH Stairs Goal: LTG Patient will ambulate up and down stairs w/assist (PT) Description: LTG: Patient will ambulate up and down # of stairs with assistance (PT) Flowsheets (Taken 05/23/2022 2257) LTG: Pt will ambulate up/down stairs assist needed:: Supervision/Verbal cueing LTG: Pt will  ambulate up and down number of stairs: 4 steps using HRs per home set-up

## 2022-05-23 NOTE — Patient Care Conference (Signed)
Inpatient RehabilitationTeam Conference and Plan of Care Update Date: 05/23/2022   Time: 11:15 AM    Patient Name: Robin Arellano      Medical Record Number: [id number]  Date of Birth: Aysa 27, 1952 Sex: Female         Room/Bed: 4M01C/4M01C-01 Payor Info: Payor: Jed Limerick ADVANTAGE / Plan: [employer] Must PPO / Product Type: *No Product type* /    Admit Date/Time:  05/22/2022  4:12 PM  Primary Diagnosis:  Acute left PCA stroke [facility])  Hospital Problems: Principal Problem:   Acute left PCA stroke [facility])    Expected Discharge Date: Expected Discharge Date:  (evals pending)  Team Members Present: Physician leading conference: Dr. Leeroy Cha Social Worker Present: Ovidio Kin, LCSW Nurse Present: Dorien Chihuahua, RN PT Present: Ginnie Smart, PT OT Present: Jennefer Bravo, OT SLP Present: Charolett Bumpers, SLP PPS Coordinator present : Ileana Ladd, PT     Current Status/Progress Goal Weekly Team Focus  Bowel/Bladder     continent        Swallow/Nutrition/ Hydration             ADL's             Mobility      PT EVAL PENDING      Communication   Mod A higher level word finding  Min A  word finding, convergent and divergent naming   Safety/Cognition/ Behavioral Observations  Mod A  Min A  basic/mildly complex problem solving, emergent awareness, sustained attention and recall   Pain     Back pain   Manage pain with prn meds and positioning   Assess need for and effectiveness of meds  Skin     N/a          Discharge Planning:  New evaluation today-home with husband and daughter-husband can assist her. Still recovering from back surgery and now had a CVA   Team Discussion: Patient with poor appetite, recent loss of weight, with hypoglycemia 2/2 not eating with back pain post left PCA CVA and recent back surgery.   Patient on target to meet rehab goals: Evals pending; SLP reported moderate cognitive deficits, word finding difficulties, and problem solving  issues.  Note right inattention and poor attention in general, patient is verbose and easily distracted.  *See Care Plan and progress notes for long and short-term goals.   Revisions to Treatment Plan:  N/a  Teaching Needs: Safety, dietary modifications, nutritional needs, medications, transfers, etc  Current Barriers to Discharge: Decreased caregiver support and Home enviroment access/layout  Possible Resolutions to Barriers: Family education with spouse and sister     Medical Summary Current Status: sleeping well, back pain from prior surgery, moving bowels regularly, abdominal pain from Lovenox injections, recent weight loss, hyperglycemia  Barriers to Discharge: Medical stability  Barriers to Discharge Comments: sleeping well, back pain from prior surgery, moving bowels regularly, abdominal pain from Lovenox injections, recent weight loss, hyperglycemia Possible Resolutions to [employer] Focus: continue percocet prn, monitor for constipation, discontinue Lovenox injections, discussed the importance of ambulating 150 feet per day to minimize risk of clots, provided dietary education   Continued Need for Acute Rehabilitation Level of Care: The patient requires daily medical management by a physician with specialized training in physical medicine and rehabilitation for the following reasons: Direction of a multidisciplinary physical rehabilitation program to maximize functional independence : Yes Medical management of patient stability for increased activity during participation in an intensive rehabilitation regime.: Yes Analysis of laboratory values and/or radiology reports  with any subsequent need for medication adjustment and/or medical intervention. : Yes   I attest that I was present, lead the team conference, and concur with the assessment and plan of the team.   Dorien Chihuahua B 05/23/2022, 4:01 PM

## 2022-05-23 NOTE — Progress Notes (Signed)
Inpatient Rehabilitation Care Coordinator Assessment and Plan Patient Details  Name: Robin Arellano MRN: [id number] Date of Birth: 11-02-1951  Today's Date: 05/23/2022  Hospital Problems: Principal Problem:   Acute left PCA stroke [facility])  Past Medical History:  Past Medical History:  Diagnosis Date   Anxiety    on meds   Back pain    Chronic female pelvic pain    Coronary artery disease    mild, non-obstructive 11/2018   Depression    on meds   Family history of adverse reaction to anesthesia    sister had difficulty waking up   Fibromyalgia    H/O leukocytosis    Headache    Hyperlipidemia    on meds   Hypertension    on meds   MI (myocardial infarction) ([place])    Pt states she did not have a MI- EKG was normal, was GERD   Osteoarthritis    on meds   Ovarian cyst, right    PONV (postoperative nausea and vomiting)    Post-operative nausea and vomiting    SVD (spontaneous vaginal delivery)    x 2   Vitamin D deficiency    Past Surgical History:  Past Surgical History:  Procedure Laterality Date   ABDOMINAL HYSTERECTOMY  1994   TAH.BSO   ANTERIOR CERVICAL DECOMP/DISCECTOMY FUSION  2019   APPENDECTOMY  1975   BACK SURGERY  2023   BIOPSY  05/12/2020   Procedure: BIOPSY;  Surgeon: Milus Banister, MD;  [facility];  Service: Endoscopy;;   CARDIAC CATHETERIZATION  2020   CHOLECYSTECTOMY N/A 07/21/2020   Procedure: LAPAROSCOPIC CHOLECYSTECTOMY WITH INTRAOPERATIVE CHOLANGIOGRAM;  Surgeon: [name], MD;  Location: [place];  Service: General;  Laterality: N/A;   COLONOSCOPY  08/12/2017   Hx TA (piecemeal)Jacobs-MAC-suprep (good)   ESOPHAGOGASTRODUODENOSCOPY (EGD) WITH PROPOFOL N/A 05/12/2020   Procedure: ESOPHAGOGASTRODUODENOSCOPY (EGD) WITH PROPOFOL;  Surgeon: Milus Banister, MD;  [facility];  Service: Endoscopy;  Laterality: N/A;   EUS N/A 05/12/2020   Procedure: UPPER ENDOSCOPIC ULTRASOUND (EUS) RADIAL;  Surgeon: Milus Banister,  MD;  [facility];  Service: Endoscopy;  Laterality: N/A;   KNEE SURGERY Bilateral 1996   x 2 - arthroscopic   LEFT HEART CATH AND CORONARY ANGIOGRAPHY N/A 11/25/2018   Procedure: LEFT HEART CATH AND CORONARY ANGIOGRAPHY;  Surgeon: Troy Sine, MD;  Location: [place] [facility];  Service: Cardiovascular;  Laterality: N/A;   PELVIC LAPAROSCOPY  1989   W LYSIS OF ADHESIONS/L SALPINGONEOSTOMY   TUBAL LIGATION     WISDOM TOOTH EXTRACTION     Social History:  reports that she quit smoking about 23 years ago. Her smoking use included cigarettes. She has a 3.00 pack-year smoking history. She has never used smokeless tobacco. She reports that she does not currently use alcohol. She reports that she does not use drugs.  Family / Support Systems Marital Status: Married Patient Roles: Spouse, Parent, Other (Comment) (sibling) Spouse/Significant Other: Elenore Rota [phone] Children: Daughter in the home but she is disabled Other Supports: [name]-sister [phone] Anticipated Caregiver: husband and sister Ability/Limitations of Caregiver: HUsband is in good health and can assist and was prior to admission due to recent back surgery Caregiver Availability: 24/7 Family Dynamics: Close with family and sister, she feels she has good supports and is grateful to them. She hopes to be here a short time and go home soon  Social History Preferred language: English Religion: Christian Cultural Background: No issues Education: [place] -  How often do you need to have someone help you when you read instructions, pamphlets, or other written material from your doctor or pharmacy?: Never Writes: Yes Employment Status: Retired Public relations account executive Issues: No issues Guardian/Conservator: none-according to MD pt is capable of making her own decisions while here-seems to be high level and will not be here long   Abuse/Neglect Abuse/Neglect Assessment Can Be Completed: Yes Physical Abuse:  Denies Verbal Abuse: Denies Sexual Abuse: Denies Exploitation of patient/patient's resources: Denies Self-Neglect: Denies  Patient response to: Social Isolation - How often do you feel lonely or isolated from those around you?: Sometimes  Emotional Status Pt's affect, behavior and adjustment status: Pt is motivated to do well and recover from both her back surgery and now her stroke. She has good movement and seems to be recovering from her stroke. She wants to get back to her independent level and be able to assist daughter if needed. Recent Psychosocial Issues: recent back surgery Psychiatric History: history of depression takes medications and feels it helps her, she was down due to surgery and falling then stroke, but feels she is on the upswing now. Don't think she will be here long enough to see neuro-psych while here Substance Abuse History: No issues  Patient / Family Perceptions, Expectations & Goals Pt/Family understanding of illness & functional limitations: Pt is able to explain her health issues and surgery along with her CVA. She does talk with the MD and feels she has a good understanding of her plan moving forward. She needs to stop falling and iopefully the back surgery will help this Premorbid pt/family roles/activities: Wife, mom, sister, church member, retiree, etc Anticipated changes in roles/activities/participation: resume Pt/family expectations/goals: Pt states: " I hope to be able to get up and use my walker before I leave here."  [employer]: None Premorbid Home Care/DME Agencies: Other (Comment) (rollator, cane, tub seat, bsc) Transportation available at discharge: Husband Is the patient able to respond to transportation needs?: Yes In the past 12 months, has lack of transportation kept you from medical appointments or from getting medications?: No In the past 12 months, has lack of transportation kept you from meetings, work, or from  getting things needed for daily living?: No  Discharge Planning Living Arrangements: Spouse/significant other, Children Support Systems: Spouse/significant other, Children, Other relatives, Friends/neighbors, Church/faith community Type of Residence: Private residence Insurance Resources: Multimedia programmer (specify) (Health Team Advantage) Financial Resources: Social Security, Family Support Financial Screen Referred: No Living Expenses: Own Money Management: Patient, Spouse Does the patient have any problems obtaining your medications?: No Home Management: Both husband  and pt when able Patient/Family Preliminary Plans: Return home with husband who can assist and daughter who is there, but has health issues of her own. Aware being evalauted today and goals being set. Will work on discharge needs. Care Coordinator Barriers to Discharge: Insurance for SNF coverage Care Coordinator Anticipated Follow Up Needs: HH/OP  Clinical Impression Pleasant motivated female who unfortuantely fell and had a CVA while trying to recover form her back surgery. Her husband is able to assist and they have a daughter at home but she has health issues of her own. Pt's sister can assist also if needed. Will await team evaluations and work on discharge needs.  [name] 05/23/2022, 10:09 AM

## 2022-05-24 ENCOUNTER — Other Ambulatory Visit (HOSPITAL_COMMUNITY): Payer: Self-pay

## 2022-05-24 DIAGNOSIS — G8918 Other acute postprocedural pain: Secondary | ICD-10-CM | POA: Diagnosis not present

## 2022-05-24 DIAGNOSIS — I63532 Cerebral infarction due to unspecified occlusion or stenosis of left posterior cerebral artery: Secondary | ICD-10-CM | POA: Diagnosis not present

## 2022-05-24 DIAGNOSIS — I10 Essential (primary) hypertension: Secondary | ICD-10-CM | POA: Diagnosis not present

## 2022-05-24 DIAGNOSIS — E663 Overweight: Secondary | ICD-10-CM | POA: Diagnosis not present

## 2022-05-24 MED ORDER — FAMOTIDINE 20 MG PO TABS
10.0000 mg | ORAL_TABLET | Freq: Every day | ORAL | Status: DC
  Administered 2022-05-24 – 2022-05-26 (×3): 10 mg via ORAL
  Filled 2022-05-24 (×3): qty 1

## 2022-05-24 MED ORDER — HYDROCODONE-ACETAMINOPHEN 5-325 MG PO TABS
1.0000 | ORAL_TABLET | ORAL | Status: DC | PRN
  Administered 2022-05-24 – 2022-05-26 (×9): 1 via ORAL
  Filled 2022-05-24 (×9): qty 1

## 2022-05-24 NOTE — Progress Notes (Signed)
Physical Therapy Session Note  Patient Details  Name: Robin Arellano MRN: [id number] Date of Birth: 04/03/1951  Today's Date: 05/24/2022 PT Individual Time: 7824-2353 PT Individual Time Calculation (min): 54 min   Short Term Goals: Week 1:  PT Short Term Goal 1 (Week 1): Pt will perform supine<>sit with supervision PT Short Term Goal 2 (Week 1): Pt will perform sit<>stand transfers using LRAD with supervision PT Short Term Goal 3 (Week 1): Pt will perform bed<>chair transfers using LRAD with supervision PT Short Term Goal 4 (Week 1): Pt will ambulate at least 168f using LRAD with no more than CGA PT Short Term Goal 5 (Week 1): Pt will navigate 4 steps using HRs per home set-up with no more than CGA  Skilled Therapeutic Interventions/Progress Updates:      Pt supine in bed to start. Agreeable to therapy but reports 8/10 LBP that radiates down her LLE. Pt reports she last received pain Rx at 0400 - notified RN of pain Rx requests.   Supine<>sitting EOB with minA for trunk support with HOB slightly elevated. Able to sit unsupported with distant supervision. Donned lumbar corset brace while seated EOB with minA and remained on throughout session.   Sit<>stand to RW and stand<>pivot transfer with CGA to w/c with cues for safety. Transported in w/c for time management to main rehab gym. Sit<>stand to RW with CGA from w/c height and ambulated 2088f+ 25012fith close supervision and RW. Cues for keeping body within walker frame and safety awareness with turns. Pt also taking her hand of RW while ambulating to adjust hair or clothes, education on safety precautions to reduce falls risk.   Completed BERG balance testing with results outlined below. Pt requiring frequent rest breaks throughout testing due to fatigue and pain. She was also very externally/internally distracted from gym environment.   Patient demonstrates increased fall risk as noted by score of  31/56 on Berg Balance Scale.   (<36= high risk for falls, close to 100%; 37-45 significant >80%; 46-51 moderate >50%; 52-55 lower >25%)  Pt completing sit>supine in room with supervision with cues for log rolling. Able to readjust herself without assist. Remained semi-reclined with all needs met. Bed alarm on. Call bell in reach.    Therapy Documentation Precautions:  Precautions Precautions: Back, Fall (recent spinal L4-L5 sx on 04/26/22) Precaution Booklet Issued: No Required Braces or Orthoses: Spinal Brace Spinal Brace: Applied in sitting position, Lumbar corset, Other (comment) Spinal Brace Comments: LSO in room Restrictions Weight Bearing Restrictions: No Other Position/Activity Restrictions: Brace in room - LSO General:    Balance: Standardized Balance Assessment Standardized Balance Assessment: Berg Balance Test Berg Balance Test Sit to Stand: Able to stand  independently using hands Standing Unsupported: Able to stand 2 minutes with supervision Sitting with Back Unsupported but Feet Supported on Floor or Stool: Able to sit safely and securely 2 minutes Stand to Sit: Controls descent by using hands Transfers: Able to transfer with verbal cueing and /or supervision Standing Unsupported with Eyes Closed: Able to stand 10 seconds with supervision Standing Ubsupported with Feet Together: Able to place feet together independently and stand for 1 minute with supervision From Standing, Reach Forward with Outstretched Arm: Reaches forward but needs supervision From Standing Position, Pick up Object from Floor: Able to pick up shoe, needs supervision From Standing Position, Turn to Look Behind Over each Shoulder: Needs supervision when turning Turn 360 Degrees: Needs close supervision or verbal cueing Standing Unsupported, Alternately Place Feet on Step/Stool:  Able to complete >2 steps/needs minimal assist Standing Unsupported, One Foot in Front: Able to take small step independently and hold 30 seconds Standing  on One Leg: Tries to lift leg/unable to hold 3 seconds but remains standing independently Total Score: 31/56   Therapy/Group: Individual Therapy  Alger Simons 05/24/2022, 7:32 AM

## 2022-05-24 NOTE — Progress Notes (Addendum)
Speech Language Pathology Daily Session Note  Patient Details  Name: Robin Arellano MRN: [id number] Date of Birth: 1951-02-07  Today's Date: 05/24/2022 SLP Individual Time: 9102-8902 SLP Individual Time Calculation (min): 40 min  Short Term Goals: Week 1: SLP Short Term Goal 1 (Week 1): STG=LTG due to short ELOS (7-10 days)  Skilled Therapeutic Interventions: S: Pt seen this date for skilled ST intervention targeting cognitive-linguistic goals outlined in care plan. Pt received lethargic and lying in bed with covers over head. C/o pain in back (8 out of 10 reported); RN notified and provided medication. Agreeable to ST intervention at bedside.    O: SLP facilitated today's session by providing Sup A to Min A verbal cues for completion of BID pill organizer. Total A for providing list of current medications (name, dosage, purpose, and frequency). Pt tangential throughout; ? confabulatory at times. Benefited from verbal redirection frequently. Recalled purpose of <50% of her medications.  A: Pt appears somewhat stimulable for skilled ST interventions particularly to maintain focused attention, remain on-topic, and for self-correcting errors.   P: Pt left in bed with all safety measures activated. Call bell reviewed and within reach and all immediate needs met. Continue per current ST POC to maximize pt's independence, improve quality of life, and decrease caregiver burden upon d/c from CIR.  Pain At onset of today's session, pt reporting 8/10 pain in back; RN alerted and provided medication  Therapy/Group: Individual Therapy  Tiffini Blacksher A Leor Whyte 05/24/2022, 1:50 PM

## 2022-05-24 NOTE — Progress Notes (Signed)
Physical Therapy Session Note  Patient Details  Name: Robin Arellano MRN: [id number] Date of Birth: 02-Aug-1951  Today's Date: 05/24/2022 PT Individual Time: 2409-7353 PT Individual Time Calculation (min): 30 min   Short Term Goals: Week 1:  PT Short Term Goal 1 (Week 1): Pt will perform supine<>sit with supervision PT Short Term Goal 2 (Week 1): Pt will perform sit<>stand transfers using LRAD with supervision PT Short Term Goal 3 (Week 1): Pt will perform bed<>chair transfers using LRAD with supervision PT Short Term Goal 4 (Week 1): Pt will ambulate at least 122f using LRAD with no more than CGA PT Short Term Goal 5 (Week 1): Pt will navigate 4 steps using HRs per home set-up with no more than CGA  Skilled Therapeutic Interventions/Progress Updates:    Pt received supine in bed and agreeable to therapy session requesting to go to bathroom. Supine>sitting L EOB, HOB flat but using bedrail, with min HHA for trunk upright via logroll technique. Pt quickly starts to rise into standing requiring cuing on need to don LSO first with set-up assist. Gait in/out bathroom using RW with CGA for steadying - pt reports increased low back pain this AM compared to yesterday. Sit<>stand to/from toilet using grab bars as needed with CGA - continent of bladder. Standing hand hygiene at sink with CGA when pt starts to report really not feeling well due to the pain therefore therapist provided w/c. Pt completed seated oral care and fash wash at sink with set-up assist. Throughout session pt hyperverbal with tangential, quickly changing topics - difficult to determine if this is due to pt attempting to distract herself from the pain or due to possible cognitive impairments. Transitioned to sitting EOB where pt completed UB and LB dressing with set-up assist. Pt requesting to return to bed and rest prior to next therapy session. Sit>supine via reverse logroll with supervision. Pt left supine in bed with needs in  reach and bed alarm on.  Therapy Documentation Precautions:  Precautions Precautions: Back, Fall (recent spinal L4-L5 sx on 04/26/22) Precaution Booklet Issued: No Required Braces or Orthoses: Spinal Brace Spinal Brace: Applied in sitting position, Lumbar corset, Other (comment) Spinal Brace Comments: LSO in room Restrictions Weight Bearing Restrictions: No Other Position/Activity Restrictions: Brace in room - LSO   Pain: Reports pain in L posterior hip rated as 8/10 to 9/10 with pt stating it is "severe" - nurse reports pt premedicated - provided seated rest breaks and modification of interventions for pain management.    Therapy/Group: Individual Therapy  CTawana Scale, PT, DPT, NCS, CSRS 05/24/2022, 7:54 AM

## 2022-05-24 NOTE — Progress Notes (Signed)
Occupational Therapy Session Note  Patient Details  Name: Robin Arellano MRN: [id number] Date of Birth: 03-24-1951  Today's Date: 05/24/2022 OT Individual Time: 6948-5462 OT Individual Time Calculation (min): 58 min    Short Term Goals: Week 1:  OT Short Term Goal 1 (Week 1): Pt will complete grooming at the sink with close supervision for safety with use of FWW for standing balance. OT Short Term Goal 2 (Week 1): Pt will complete UB dressing and LB dressing with close supervision with use of FWW for clothing retrieval/standing and AE as needed. OT Short Term Goal 3 (Week 1): Pt will complete toileting with close supervision and use of FWW as needed. OT Short Term Goal 4 (Week 1): Pt will complete toilet transfer with close supervision and use of FWW as needed. OT Short Term Goal 5 (Week 1): Pt will complete bathing with close supervision using compensatory techniques and AE as needed to improve safety and maintain spinal precautions.  Skilled Therapeutic Interventions/Progress Updates:   Pt received semi-reclined in bed with NT present for vitals, ongoing LLE and abdominal pain but, agreeable to therapy. Session focus on self-care retraining, activity tolerance, R visual scanning, R FMC in prep for improved ADL/IADL/func mobility performance + decreased caregiver burden.  Per RN, pt not yet due for pain medication. Pt comes to sitting EOB close S, cues for log roll technique. Dons LSO with close S. Ambulates to and from toilet x2 throughout session with close S and RW, completes toileting task post continent void of b/b distant S.   Ambulates to and from gym in similar manner with RW, cues for pathfinding throughout.  Pt completed the following games on BITS to target R visual scanning and R NMR:  Game:Single Target Accuracy: 90.33% Reaction Time: 2.06 Duration Time: 2 min Hits: 56 Accuracy: 94.55% Reaction Time: 2.22 Duration Time: 2 min Hits: 52 , slightly slower to identify  targets in RUQ, RLQ.  Administered the Nine Hole Peg Test (NHPT)  with the following results: -R:38 seconds -L: 36 seconds ( 37-45 seconds is considered below average ; greater than 45 seconds is considered poor) thus demonstrating deficits in fine motor skills, hand dexterity, and speed.) Pt reports her [facility] are impaired compared dto baseline, primarily due to finger tip numbness.  Pt relates she does a lot of typing on her laptop at home for leisure and home management, in the past she typed 99 WPM. Completed 1 min typing test with 12 WPM and 86% accuracy results. Attempted to continue to practice typing, but pt declining further practice at this time due to frustration. Able to write personal information down with dominant R hand, 100% legibile and pt self rates accuracy as a "B." Issued yellow theraputty/beads and reviewed therex to target improved R FMC/grip strength. Pt left semi-reclined in bed with rounding team present, bed alarm engaged, call bell in reach, and all immediate needs met.    Therapy Documentation Precautions:  Precautions Precautions: Back, Fall (recent spinal L4-L5 sx on 04/26/22) Precaution Booklet Issued: No Required Braces or Orthoses: Spinal Brace Spinal Brace: Applied in sitting position, Lumbar corset, Other (comment) Spinal Brace Comments: LSO in room Restrictions Weight Bearing Restrictions: No Other Position/Activity Restrictions: Brace in room - LSO  Pain: "feels like a wrench is twisting my abdomen" ADL: See Care Tool for more details.   Therapy/Group: Individual Therapy  Volanda Napoleon MS, OTR/L  05/24/2022, 6:54 AM

## 2022-05-24 NOTE — Progress Notes (Signed)
PROGRESS NOTE   Subjective/Complaints: Complains of pain radiating into left glut and thigh which was similar to the pain she experienced prior to surgery so she is worried about this. She has tried gabapentin, lyrica, cymbalta, and amitriptyline in the past and failed these  ROS: +postoperative spinal pain, +neuropathic pain in left glut and posterior thigh   Objective:   No results found. Recent Labs    05/22/22 0358 05/23/22 0724  WBC 8.8 9.0  HGB 8.8* 10.0*  HCT 26.6* 30.6*  PLT 271 316   Recent Labs    05/22/22 0358 05/23/22 0724  NA 134* 137  K 3.7 4.5  CL 107 105  CO2 21* 24  GLUCOSE 94 104*  BUN 19 16  CREATININE 1.72* 1.75*  CALCIUM 8.8* 9.5    Intake/Output Summary (Last 24 hours) at 05/24/2022 1016 Last data filed at 05/24/2022 0730 Gross per 24 hour  Intake 120 ml  Output --  Net 120 ml        Physical Exam: Vital Signs Blood pressure (!) 139/51, pulse (!) 50, temperature 98.7 F (37.1 C), temperature source Oral, resp. rate 16, height 5' 2.99" (1.6 m), weight 71 kg, SpO2 98 %. Gen: no distress, normal appearing, overweight BMI 27.73 HEENT: oral mucosa pink and moist, NCAT, R hemianopsia noted  Cardio: Bradycardia Chest: normal effort, normal rate of breathing Abdominal:     General: Abdomen is flat. Bowel sounds are normal. There is no distension.     Palpations: Abdomen is soft.     Tenderness: There is abdominal tenderness.     Comments: Multpile injection bruises All over the abdomen  Musculoskeletal:     Cervical back: Neck supple. No tenderness.     Right lower leg: No edema.     Left lower leg: No edema.     Comments: RUE- 4+/5 throughout LUE 5/5 RLE_ 4-/5 throughout LLE- 5/5   Skin:    General: Skin is warm and dry.     Comments: Back incision continues to heal nicely Abd bruising as detailed above  Neurological:     Mental Status: She is alert and oriented to person,  place, and time. Impaired attention    Comments: Intact to light touch in all 4 extremties  Psychiatric:     Comments: Slightly decreased memory     Assessment/Plan: 1. Functional deficits which require 3+ hours per day of interdisciplinary therapy in a comprehensive inpatient rehab setting. Physiatrist is providing close team supervision and 24 hour management of active medical problems listed below. Physiatrist and rehab team continue to assess barriers to discharge/monitor patient progress toward functional and medical goals  Care Tool:  Bathing    Body parts bathed by patient: Right arm, Left arm, Chest, Abdomen, Front perineal area, Buttocks, Face, Left lower leg, Right lower leg, Left upper leg, Right upper leg         Bathing assist Assist Level: Minimal Assistance - Patient > 75% (standing balance)     Upper Body Dressing/Undressing Upper body dressing   What is the patient wearing?: Pull over shirt    Upper body assist Assist Level: Minimal Assistance - Patient > 75%  Lower Body Dressing/Undressing Lower body dressing      What is the patient wearing?: Underwear/pull up, Pants     Lower body assist Assist for lower body dressing: Minimal Assistance - Patient > 75%     Toileting Toileting    Toileting assist Assist for toileting: Minimal Assistance - Patient > 75% (standing balance)     Transfers Chair/bed transfer  Transfers assist     Chair/bed transfer assist level: Minimal Assistance - Patient > 75% Chair/bed transfer assistive device: Programmer, multimedia   Ambulation assist      Assist level: Minimal Assistance - Patient > 75% Assistive device: Walker-rolling Max distance: 127f   Walk 10 feet activity   Assist     Assist level: Minimal Assistance - Patient > 75% Assistive device: Walker-rolling   Walk 50 feet activity   Assist    Assist level: Minimal Assistance - Patient > 75% Assistive device:  Walker-rolling    Walk 150 feet activity   Assist    Assist level: Minimal Assistance - Patient > 75% Assistive device: Walker-rolling    Walk 10 feet on uneven surface  activity   Assist     Assist level: Minimal Assistance - Patient > 75% Assistive device: WChemical engineer    Assist Is the patient using a wheelchair?: No             Wheelchair 50 feet with 2 turns activity    Assist            Wheelchair 150 feet activity     Assist          Blood pressure (!) 139/51, pulse (!) 50, temperature 98.7 F (37.1 C), temperature source Oral, resp. rate 16, height 5' 2.99" (1.6 m), weight 71 kg, SpO2 98 %.    Medical Problem List and Plan: 1. Functional deficits secondary to acute left PCA infarct, recent L4-L5 lumbar fusion             -patient may  shower             -ELOS/Goals: 7-10 days supervision  Continue CIR 2.  Antithrombotics: -DVT/anticoagulation:  Pharmaceutical: Lovenox (changed from heparin)             -antiplatelet therapy: Aspirin and Plavix started 7/5 for 3 weeks then aspirin alone 3. Pain Management: Topamax 25 mg twice daily             -Fioricet 1 every 6 hours as needed             -Robaxin 500 mg every 6 hours as needed             -Percocet 5/325 6 hours as needed 4. Mood/Behavior/Sleep: CSW to evaluate and provide emotional support             -Xanax 0.5 mg q HS as needed             -antipsychotic agents: n/a 5. Neuropsych/cognition: This patient is capable of making decisions on her own behalf. 6. Skin/Wound Care: Routine skin care checks 7. Fluids/Electrolytes/Nutrition: Routine Is and Os and follow-up chemistries 8.  Hypertension: Continue amlodipine 10 mg daily             - continue chlorthalidone 25 mg daily             -continue clonidine 0.1 mg nightly             -continue hydralazine 100 mg 3  times daily  -will not increase further given her bradycardia  -discussed her conversation with  her husband to reduce his anger, which stresses her and increases her blood pressure.  9.  Hyperlipidemia: LDL reviewed and is 101, contnue atorvastatin 40 mg daily 10: Oral thrush: Continue nystatin suspension 4 times daily 11: Acute kidney injury atop chronic kidney disease stage IIIb: Improved ; follow-up BMP.  Baseline serum creatinine 1.4-1.6 12: Mild hyponatremia: Follow-up BMP 13: Anemia of chronic disease: Asymptomatic, follow-up CBC 14: Chronic back pain status post lumbar surgery 6/15             -Follow-up with Dr. Ellene Route  -will change medication from Percocet to [facility] which she felt helped her more in the past 15: Aortic arch and descending thoracic aortic disease:             -Follow-up CTA as outpatient             -Follow-up CT surgery, vascular surgery 16. Bradycardia: medications reviewed and will maintain anti-hypertensives given her elevated blood pressure.  17. Recent 20 lbs: liberalized diet to regular  LOS: 2 days A FACE TO FACE EVALUATION WAS PERFORMED  Martha Clan P Zackaria Burkey 05/24/2022, 10:16 AM

## 2022-05-25 ENCOUNTER — Encounter (HOSPITAL_COMMUNITY): Payer: Self-pay | Admitting: Physical Medicine and Rehabilitation

## 2022-05-25 DIAGNOSIS — I10 Essential (primary) hypertension: Secondary | ICD-10-CM | POA: Diagnosis not present

## 2022-05-25 DIAGNOSIS — E663 Overweight: Secondary | ICD-10-CM | POA: Diagnosis not present

## 2022-05-25 DIAGNOSIS — G8918 Other acute postprocedural pain: Secondary | ICD-10-CM | POA: Diagnosis not present

## 2022-05-25 DIAGNOSIS — I63532 Cerebral infarction due to unspecified occlusion or stenosis of left posterior cerebral artery: Secondary | ICD-10-CM | POA: Diagnosis not present

## 2022-05-25 MED ORDER — MECLIZINE HCL 25 MG PO TABS
12.5000 mg | ORAL_TABLET | Freq: Three times a day (TID) | ORAL | Status: DC | PRN
  Administered 2022-05-26 (×2): 12.5 mg via ORAL
  Filled 2022-05-25 (×2): qty 1

## 2022-05-25 NOTE — Progress Notes (Signed)
Speech Language Pathology Daily Session Note  Patient Details  Name: Samira Alanta Scobey MRN: [id number] Date of Birth: 06/06/1951  Today's Date: 05/25/2022 SLP Individual Time: 5465-0354 SLP Individual Time Calculation (min): 58 min  Short Term Goals: Week 1: SLP Short Term Goal 1 (Week 1): STG=LTG due to short ELOS (7-10 days)  Skilled Therapeutic Interventions: Skilled treatment session focused on cognitive goals. Throughout session, patient was mildly distracted by pain in left leg. Nursing made aware and patient was premedicated. SLP utilized distraction throughout session in attempts to mitigate pain. SLP facilitated session by providing Min-Mod A verbal cues for selective attention to task in a mildly distracting environment due to verbosity. Patient able to recall recent events and pertinent information regarding safety precautions with overall supervision level verbal cues throughout functional conversation. No word-finding deficits noted. Patient independently completed a mildly complex appointment/calendar task and enhanced her own calendar in her room to maximize recall of orientation information. Patient left upright in bed with alarm on and all needs within reach. Continue with current plan of care.      Pain Pain Assessment Pain Scale: 0-10 Pain Score: 8  Pain Type: Acute pain Pain Location: Back Pain Orientation: Left Pain Radiating Towards: leg, hip Pain Descriptors / Indicators: Sharp;Shooting Pain Frequency: Constant Pain Onset: On-going Patients Stated Pain Goal: 2 Pain Intervention(s): Medication (See eMAR) Multiple Pain Sites: No  Therapy/Group: Individual Therapy  Halynn Reitano 05/25/2022, 2:52 PM

## 2022-05-25 NOTE — Progress Notes (Signed)
Occupational Therapy Session Note  Patient Details  Name: Robin Arellano MRN: [id number] Date of Birth: 11-03-1951  Today's Date: 05/25/2022 OT Individual Time: 8546-2703 OT Individual Time Calculation (min): 58 min    Short Term Goals: Week 1:  OT Short Term Goal 1 (Week 1): Pt will complete grooming at the sink with close supervision for safety with use of FWW for standing balance. OT Short Term Goal 2 (Week 1): Pt will complete UB dressing and LB dressing with close supervision with use of FWW for clothing retrieval/standing and AE as needed. OT Short Term Goal 3 (Week 1): Pt will complete toileting with close supervision and use of FWW as needed. OT Short Term Goal 4 (Week 1): Pt will complete toilet transfer with close supervision and use of FWW as needed. OT Short Term Goal 5 (Week 1): Pt will complete bathing with close supervision using compensatory techniques and AE as needed to improve safety and maintain spinal precautions.  Skilled Therapeutic Interventions/Progress Updates:    Patient received supine in bed.  Skilled OT intervention to address safety and functional mobility as well as increasing independence while adhering to back precautions during BADL.  Patient requires cueing to sit up without assistance- patient reaches for therapists arm initially to be pulled up out of bed.  Patient states - 'I need to hurry because I need to go to the bathroom.  Patient instructed to put on LSO - Patient argues - I only need that to walk.  When asked how she was planning to go to the bathroom - she intended to walk.  Patient able to don LSO with cueing.  Walked to bathroom for toileting, then shower.  Patient able to maintain back precautions and wash her lower legs and feet - and she ID's this as improvement.  Anticipate patient will require supervision upon discharge.  Patient requiring intermittent cueing for safety and to maintain back precautions.    Therapy  Documentation Precautions:  Precautions Precautions: Back, Fall (recent spinal L4-L5 sx on 04/26/22) Precaution Booklet Issued: No Required Braces or Orthoses: Spinal Brace Spinal Brace: Applied in sitting position, Lumbar corset, Other (comment) Spinal Brace Comments: LSO in room Restrictions Weight Bearing Restrictions: No Other Position/Activity Restrictions: Brace in room - LSO  Pain: 3/10 back    Therapy/Group: Individual Therapy  Mariah Milling 05/25/2022, 12:19 PM

## 2022-05-25 NOTE — Progress Notes (Signed)
Contacted Dr. Clarice Pole office and spoke with MA regarding consult for post-operative radiculopathy.

## 2022-05-25 NOTE — Progress Notes (Signed)
Physical Therapy Session Note  Patient Details  Name: Robin Arellano MRN: [id number] Date of Birth: 1951-09-22  Today's Date: 05/25/2022 PT Individual Time: 2836-6294 + [id number] PT Individual Time Calculation (min): 25 min  + 57 min  Short Term Goals: Week 1:  PT Short Term Goal 1 (Week 1): Pt will perform supine<>sit with supervision PT Short Term Goal 2 (Week 1): Pt will perform sit<>stand transfers using LRAD with supervision PT Short Term Goal 3 (Week 1): Pt will perform bed<>chair transfers using LRAD with supervision PT Short Term Goal 4 (Week 1): Pt will ambulate at least 166f using LRAD with no more than CGA PT Short Term Goal 5 (Week 1): Pt will navigate 4 steps using HRs per home set-up with no more than CGA  Skilled Therapeutic Interventions/Progress Updates:       1st session: Pt supine in bed to start. RN entering room to administer morning Rx. Pt reports 5/10 LBP, RN providing pain Rx for relief. Pt reports "I looked you up on Facebook last night and found your wife's name."   Supine<>sitting EOB with minA for trunk support. Donned LSO corset brace with minA while seated EOB. BP taken and vitals WNL. Pt completing sit<>stand to RW with supervision with cues for hand placement.  Ambulating from her room to main rehab gym, ~1561f with supervision and RW - pt easily distracted from environment, unsure of personality baseline vs CVA related. Cues for safety awareness while ambulating.  While seated in therapy gym, worked on meYahoo! Incnd recall. Pt reports she worked on "[place] on [place] greatly. Discussed stroke recovery, deficits related to her CVA, etc.  Pt ambulated back to her room with supervision and RW with similar deficit as above. In room, she abandoned her RW and began ambulating in her room with no AD and close supervision. She was able to open drawers and pick up some self care items without assist.    Returned to bed with supervision and bed alarm on. Call bell in reach, all needs met.    2nd session: Direct handoff of care from NT ambulating pt in the bathroom to start. Family (sisters) at the bedside. Updated family on pt's mobility, PT POC, and PT goals. Pt with LSO brace on throughout session.  Pt ambulating from her room to ortho rehab gym with supervision and RW, ~10048fCompleted car transfer with supervision and RW via side stepping method, no signs of unsteadiness during transfer. Car height simulating sedan.  Ambulated to main rehab gym, ~150f23fith supervision and RW. Completed stair navigation going up/down x8, 6inch steps using 2 hand rails and supervision assist, self selected reciprocal stepping.   Worked on dynamic standing balance with shooting basketball in standing without UE support, close supervision for safety.   Gait training with vs without RW to assess LRAD for DC planning - requires CGA for safety while ambulating with RW due to fatigue and acute on chronic LBP. Recommend RW at this time.   Instructed in lego puzzle while seated on mat table - required maxA for completing moderately complex puzzle, increased difficulty due to reports of being "color blind" but unable to confirm. She struggled with spatial relations, figure orientation during task.   She concluded session seated EOB with all needs met, call bell in reach. *Pt remains highly distractible for external > internal stimuli. She reports of LBP frequently during session but can be easily redirected.  Therapy Documentation Precautions:  Precautions Precautions: Back, Fall (recent spinal L4-L5 sx on 04/26/22) Precaution Booklet Issued: No Required Braces or Orthoses: Spinal Brace Spinal Brace: Applied in sitting position, Lumbar corset, Other (comment) Spinal Brace Comments: LSO in room Restrictions Weight Bearing Restrictions: No Other Position/Activity Restrictions: Brace in room -  LSO General:     Therapy/Group: Individual Therapy  Alger Simons 05/25/2022, 7:32 AM

## 2022-05-25 NOTE — Plan of Care (Signed)
  Problem: Consults Goal: RH STROKE PATIENT EDUCATION Description: See Patient Education module for education specifics  Outcome: Progressing   Problem: RH BOWEL ELIMINATION Goal: RH STG MANAGE BOWEL WITH ASSISTANCE Description: STG Manage Bowel with mod I Assistance. Outcome: Progressing Goal: RH STG MANAGE BOWEL W/MEDICATION W/ASSISTANCE Description: STG Manage Bowel with Medication with mod I  Assistance. Outcome: Progressing   Problem: RH SAFETY Goal: RH STG ADHERE TO SAFETY PRECAUTIONS W/ASSISTANCE/DEVICE Description: STG Adhere to Safety Precautions With cues Assistance/Device. Outcome: Progressing   Problem: RH PAIN MANAGEMENT Goal: RH STG PAIN MANAGED AT OR BELOW PT'S PAIN GOAL Description: At or below level 4 with prns Outcome: Progressing   Problem: RH KNOWLEDGE DEFICIT Goal: RH STG INCREASE KNOWLEDGE OF HYPERTENSION Description: Patient and spouse  will be able to manage HTN with dietary modifications, medications, using handouts and educational resources independently Outcome: Progressing Goal: RH STG INCREASE KNOWLEGDE OF HYPERLIPIDEMIA Description: Patient and spouse will be able to manage HLD, with dietary modifications, medications, using handouts and educational resources independently Outcome: Progressing Goal: RH STG INCREASE KNOWLEDGE OF STROKE PROPHYLAXIS Description: Patient and spouse will be able to manage secondary risks, with dietary modifications, medications, using handouts and educational resources independently Outcome: Progressing

## 2022-05-25 NOTE — Progress Notes (Signed)
PROGRESS NOTE   Subjective/Complaints: Continues to have presurgical radicula pain, asked if she would like Dr. Ellene Route alerted and she would very much like to see him, we will notify his team. Notes that the Norco seems to help per pain moreso than the Percocet  ROS: +postoperative spinal pain, +neuropathic pain in left glut and posterior thigh, stress level improved   Objective:   No results found. Recent Labs    05/23/22 0724  WBC 9.0  HGB 10.0*  HCT 30.6*  PLT 316   Recent Labs    05/23/22 0724  NA 137  K 4.5  CL 105  CO2 24  GLUCOSE 104*  BUN 16  CREATININE 1.75*  CALCIUM 9.5    Intake/Output Summary (Last 24 hours) at 05/25/2022 1019 Last data filed at 05/25/2022 0734 Gross per 24 hour  Intake 238 ml  Output --  Net 238 ml        Physical Exam: Vital Signs Blood pressure (!) 145/62, pulse (!) 58, temperature 98.8 F (37.1 C), resp. rate 18, height 5' 2.99" (1.6 m), weight 71 kg, SpO2 96 %. Gen: no distress, normal appearing, overweight BMI 27.73 HEENT: oral mucosa pink and moist, NCAT, R hemianopsia noted  Cardio: Bradycardia Chest: normal effort, normal rate of breathing Abdominal:     General: Abdomen is flat. Bowel sounds are normal. There is no distension.     Palpations: Abdomen is soft.     Tenderness: There is abdominal tenderness.     Comments: Multpile injection bruises All over the abdomen  Musculoskeletal:     Cervical back: Neck supple. No tenderness.     Right lower leg: No edema.     Left lower leg: No edema.     Comments: RUE- 4+/5 throughout LUE 5/5 RLE_ 4-/5 throughout LLE- 5/5   Skin:    General: Skin is warm and dry.     Comments: Back incision continues to heal nicely Abd bruising as detailed above  Neurological:     Mental Status: She is alert and oriented to person, place, and time. Impaired attention, tangential    Comments: Intact to light touch in all 4  extremties  Psychiatric:     Comments: Slightly decreased memory     Assessment/Plan: 1. Functional deficits which require 3+ hours per day of interdisciplinary therapy in a comprehensive inpatient rehab setting. Physiatrist is providing close team supervision and 24 hour management of active medical problems listed below. Physiatrist and rehab team continue to assess barriers to discharge/monitor patient progress toward functional and medical goals  Care Tool:  Bathing    Body parts bathed by patient: Right arm, Left arm, Chest, Abdomen, Front perineal area, Buttocks, Face, Left lower leg, Right lower leg, Left upper leg, Right upper leg         Bathing assist Assist Level: Minimal Assistance - Patient > 75% (standing balance)     Upper Body Dressing/Undressing Upper body dressing   What is the patient wearing?: Pull over shirt    Upper body assist Assist Level: Minimal Assistance - Patient > 75%    Lower Body Dressing/Undressing Lower body dressing      What is the  patient wearing?: Underwear/pull up, Pants     Lower body assist Assist for lower body dressing: Minimal Assistance - Patient > 75%     Toileting Toileting    Toileting assist Assist for toileting: Minimal Assistance - Patient > 75% (standing balance)     Transfers Chair/bed transfer  Transfers assist     Chair/bed transfer assist level: Minimal Assistance - Patient > 75% Chair/bed transfer assistive device: Programmer, multimedia   Ambulation assist      Assist level: Minimal Assistance - Patient > 75% Assistive device: Walker-rolling Max distance: 160f   Walk 10 feet activity   Assist     Assist level: Minimal Assistance - Patient > 75% Assistive device: Walker-rolling   Walk 50 feet activity   Assist    Assist level: Minimal Assistance - Patient > 75% Assistive device: Walker-rolling    Walk 150 feet activity   Assist    Assist level: Minimal Assistance  - Patient > 75% Assistive device: Walker-rolling    Walk 10 feet on uneven surface  activity   Assist     Assist level: Minimal Assistance - Patient > 75% Assistive device: WChemical engineer    Assist Is the patient using a wheelchair?: No             Wheelchair 50 feet with 2 turns activity    Assist            Wheelchair 150 feet activity     Assist          Blood pressure (!) 145/62, pulse (!) 58, temperature 98.8 F (37.1 C), resp. rate 18, height 5' 2.99" (1.6 m), weight 71 kg, SpO2 96 %.    Medical Problem List and Plan: 1. Functional deficits secondary to acute left PCA infarct, recent L4-L5 lumbar fusion             -patient may  shower             -ELOS/Goals: 7-10 days supervision  Continue CIR 2.  Antithrombotics: -DVT/anticoagulation:  Pharmaceutical: Lovenox (changed from heparin)             -antiplatelet therapy: Aspirin and Plavix started 7/5 for 3 weeks then aspirin alone 3. Postsurgical pain: Topamax 25 mg twice daily             -Fioricet 1 every 6 hours as needed             -Robaxin 500 mg every 6 hours as needed             -Percocet 5/325 6 hours as needed 4. Mood/Behavior/Sleep: CSW to evaluate and provide emotional support             -Xanax 0.5 mg q HS as needed             -antipsychotic agents: n/a 5. Neuropsych/cognition: This patient is capable of making decisions on her own behalf. 6. Skin/Wound Care: Routine skin care checks 7. Fluids/Electrolytes/Nutrition: Routine Is and Os and follow-up chemistries 8.  Hypertension: Continue amlodipine 10 mg daily             - continue chlorthalidone 25 mg daily             -continue clonidine 0.1 mg nightly             -continue hydralazine 100 mg 3 times daily  -will not increase further given her bradycardia  -discussed her conversation with her husband  to reduce his anger, which stresses her and increases her blood pressure.   -discussed we can try to wean  off blood pressure medications if blood pressure improves 9.  Hyperlipidemia: LDL reviewed and is 101, contnue atorvastatin 40 mg daily 10: Oral thrush: Continue nystatin suspension 4 times daily 11: Acute kidney injury atop chronic kidney disease stage IIIb: Improved ; follow-up BMP.  Baseline serum creatinine 1.4-1.6 12: Mild hyponatremia: Follow-up BMP 13: Anemia of chronic disease: Asymptomatic, follow-up CBC 14: Chronic back pain status post lumbar surgery 6/15             -Follow-up with Dr. 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 Bradycardia: medications reviewed and will maintain anti-hypertensives given her elevated blood pressure.  17. Recent 20 lbs: liberalized diet to regular 18. Left sided radiculopathy: will notify Dr. Ellene Route regarding return of presurgical pain.   LOS: 3 days A FACE TO FACE EVALUATION WAS PERFORMED  Clide Deutscher Brookelynne Dimperio 05/25/2022, 10:19 AM

## 2022-05-26 ENCOUNTER — Inpatient Hospital Stay (HOSPITAL_COMMUNITY): Payer: PPO

## 2022-05-26 ENCOUNTER — Inpatient Hospital Stay (HOSPITAL_COMMUNITY)
Admission: AD | Admit: 2022-05-26 | Discharge: 2022-06-15 | DRG: 177 | Disposition: A | Discharge status: Home Health | Payer: PPO | Source: Other Acute Inpatient Hospital | Attending: Internal Medicine | Admitting: Internal Medicine

## 2022-05-26 DIAGNOSIS — F419 Anxiety disorder, unspecified: Secondary | ICD-10-CM | POA: Diagnosis present

## 2022-05-26 DIAGNOSIS — M797 Fibromyalgia: Secondary | ICD-10-CM | POA: Diagnosis present

## 2022-05-26 DIAGNOSIS — G8929 Other chronic pain: Secondary | ICD-10-CM | POA: Diagnosis not present

## 2022-05-26 DIAGNOSIS — U071 COVID-19: Secondary | ICD-10-CM | POA: Diagnosis present

## 2022-05-26 DIAGNOSIS — Z884 Allergy status to anesthetic agent status: Secondary | ICD-10-CM

## 2022-05-26 DIAGNOSIS — K859 Acute pancreatitis without necrosis or infection, unspecified: Secondary | ICD-10-CM | POA: Diagnosis not present

## 2022-05-26 DIAGNOSIS — Z7982 Long term (current) use of aspirin: Secondary | ICD-10-CM | POA: Diagnosis not present

## 2022-05-26 DIAGNOSIS — Z888 Allergy status to other drugs, medicaments and biological substances status: Secondary | ICD-10-CM

## 2022-05-26 DIAGNOSIS — N390 Urinary tract infection, site not specified: Secondary | ICD-10-CM | POA: Diagnosis not present

## 2022-05-26 DIAGNOSIS — Z981 Arthrodesis status: Secondary | ICD-10-CM

## 2022-05-26 DIAGNOSIS — I639 Cerebral infarction, unspecified: Secondary | ICD-10-CM | POA: Diagnosis present

## 2022-05-26 DIAGNOSIS — D631 Anemia in chronic kidney disease: Secondary | ICD-10-CM | POA: Diagnosis present

## 2022-05-26 DIAGNOSIS — Z8049 Family history of malignant neoplasm of other genital organs: Secondary | ICD-10-CM

## 2022-05-26 DIAGNOSIS — Z91040 Latex allergy status: Secondary | ICD-10-CM | POA: Diagnosis not present

## 2022-05-26 DIAGNOSIS — I1310 Hypertensive heart and chronic kidney disease without heart failure, with stage 1 through stage 4 chronic kidney disease, or unspecified chronic kidney disease: Secondary | ICD-10-CM | POA: Diagnosis present

## 2022-05-26 DIAGNOSIS — G928 Other toxic encephalopathy: Secondary | ICD-10-CM | POA: Diagnosis not present

## 2022-05-26 DIAGNOSIS — Z825 Family history of asthma and other chronic lower respiratory diseases: Secondary | ICD-10-CM | POA: Diagnosis not present

## 2022-05-26 DIAGNOSIS — Z8249 Family history of ischemic heart disease and other diseases of the circulatory system: Secondary | ICD-10-CM | POA: Diagnosis not present

## 2022-05-26 DIAGNOSIS — E782 Mixed hyperlipidemia: Secondary | ICD-10-CM | POA: Diagnosis present

## 2022-05-26 DIAGNOSIS — I10 Essential (primary) hypertension: Secondary | ICD-10-CM | POA: Diagnosis not present

## 2022-05-26 DIAGNOSIS — R7989 Other specified abnormal findings of blood chemistry: Secondary | ICD-10-CM | POA: Diagnosis not present

## 2022-05-26 DIAGNOSIS — N1832 Chronic kidney disease, stage 3b: Secondary | ICD-10-CM | POA: Diagnosis present

## 2022-05-26 DIAGNOSIS — M4316 Spondylolisthesis, lumbar region: Secondary | ICD-10-CM | POA: Diagnosis present

## 2022-05-26 DIAGNOSIS — Z87891 Personal history of nicotine dependence: Secondary | ICD-10-CM

## 2022-05-26 DIAGNOSIS — R509 Fever, unspecified: Secondary | ICD-10-CM

## 2022-05-26 DIAGNOSIS — R112 Nausea with vomiting, unspecified: Secondary | ICD-10-CM | POA: Diagnosis not present

## 2022-05-26 DIAGNOSIS — I2510 Atherosclerotic heart disease of native coronary artery without angina pectoris: Secondary | ICD-10-CM | POA: Diagnosis present

## 2022-05-26 DIAGNOSIS — E871 Hypo-osmolality and hyponatremia: Secondary | ICD-10-CM | POA: Diagnosis present

## 2022-05-26 DIAGNOSIS — Z808 Family history of malignant neoplasm of other organs or systems: Secondary | ICD-10-CM | POA: Diagnosis not present

## 2022-05-26 DIAGNOSIS — M1990 Unspecified osteoarthritis, unspecified site: Secondary | ICD-10-CM | POA: Diagnosis present

## 2022-05-26 DIAGNOSIS — N179 Acute kidney failure, unspecified: Secondary | ICD-10-CM | POA: Diagnosis not present

## 2022-05-26 DIAGNOSIS — I63532 Cerebral infarction due to unspecified occlusion or stenosis of left posterior cerebral artery: Secondary | ICD-10-CM | POA: Diagnosis not present

## 2022-05-26 DIAGNOSIS — Z79899 Other long term (current) drug therapy: Secondary | ICD-10-CM

## 2022-05-26 DIAGNOSIS — Z8673 Personal history of transient ischemic attack (TIA), and cerebral infarction without residual deficits: Secondary | ICD-10-CM

## 2022-05-26 DIAGNOSIS — M5416 Radiculopathy, lumbar region: Secondary | ICD-10-CM | POA: Diagnosis present

## 2022-05-26 DIAGNOSIS — L89322 Pressure ulcer of left buttock, stage 2: Secondary | ICD-10-CM | POA: Diagnosis present

## 2022-05-26 DIAGNOSIS — G9341 Metabolic encephalopathy: Secondary | ICD-10-CM | POA: Diagnosis not present

## 2022-05-26 DIAGNOSIS — M5442 Lumbago with sciatica, left side: Secondary | ICD-10-CM | POA: Diagnosis not present

## 2022-05-26 DIAGNOSIS — Z7902 Long term (current) use of antithrombotics/antiplatelets: Secondary | ICD-10-CM

## 2022-05-26 DIAGNOSIS — E876 Hypokalemia: Secondary | ICD-10-CM | POA: Diagnosis present

## 2022-05-26 DIAGNOSIS — T380X5A Adverse effect of glucocorticoids and synthetic analogues, initial encounter: Secondary | ICD-10-CM | POA: Diagnosis not present

## 2022-05-26 DIAGNOSIS — M545 Low back pain, unspecified: Secondary | ICD-10-CM | POA: Diagnosis present

## 2022-05-26 DIAGNOSIS — L8990 Pressure ulcer of unspecified site, unspecified stage: Secondary | ICD-10-CM | POA: Insufficient documentation

## 2022-05-26 LAB — BASIC METABOLIC PANEL
Anion gap: 17 — ABNORMAL HIGH (ref 5–15)
BUN: 18 mg/dL (ref 8–23)
CO2: 20 mmol/L — ABNORMAL LOW (ref 22–32)
Calcium: 9.4 mg/dL (ref 8.9–10.3)
Chloride: 98 mmol/L (ref 98–111)
Creatinine, Ser: 1.81 mg/dL — ABNORMAL HIGH (ref 0.44–1.00)
GFR, Estimated: 30 mL/min — ABNORMAL LOW (ref >60)
Glucose, Bld: 101 mg/dL — ABNORMAL HIGH (ref 70–99)
Potassium: 3.1 mmol/L — ABNORMAL LOW (ref 3.5–5.1)
Sodium: 135 mmol/L (ref 135–145)

## 2022-05-26 LAB — RESPIRATORY PANEL BY PCR

## 2022-05-26 LAB — RESP PANEL BY RT-PCR (FLU A&B, COVID) ARPGX2
Influenza A by PCR: NEGATIVE
Influenza B by PCR: NEGATIVE
SARS Coronavirus 2 by RT PCR: POSITIVE — AB

## 2022-05-26 LAB — CBC WITH DIFFERENTIAL/PLATELET
Abs Immature Granulocytes: 0.04 10*3/uL (ref 0.00–0.07)
Basophils Absolute: 0.1 10*3/uL (ref 0.0–0.1)
Basophils Relative: 1 %
Eosinophils Absolute: 0.1 10*3/uL (ref 0.0–0.5)
Eosinophils Relative: 1 %
HCT: 30.7 % — ABNORMAL LOW (ref 36.0–46.0)
Hemoglobin: 10.5 g/dL — ABNORMAL LOW (ref 12.0–15.0)
Immature Granulocytes: 1 %
Lymphocytes Relative: 3 %
Lymphs Abs: 0.2 10*3/uL — ABNORMAL LOW (ref 0.7–4.0)
MCH: 32.7 pg (ref 26.0–34.0)
MCHC: 34.2 g/dL (ref 30.0–36.0)
MCV: 95.6 fL (ref 80.0–100.0)
Monocytes Absolute: 0.7 10*3/uL (ref 0.1–1.0)
Monocytes Relative: 9 %
Neutro Abs: 7.1 10*3/uL (ref 1.7–7.7)
Neutrophils Relative %: 85 %
Platelets: 254 10*3/uL (ref 150–400)
RBC: 3.21 MIL/uL — ABNORMAL LOW (ref 3.87–5.11)
RDW: 13.7 % (ref 11.5–15.5)
WBC: 8.3 10*3/uL (ref 4.0–10.5)
nRBC: 0 % (ref 0.0–0.2)

## 2022-05-26 LAB — LACTIC ACID, PLASMA
Lactic Acid, Venous: 0.6 mmol/L (ref 0.5–1.9)
Lactic Acid, Venous: 0.8 mmol/L (ref 0.5–1.9)

## 2022-05-26 LAB — GLUCOSE, CAPILLARY: Glucose-Capillary: 122 mg/dL — ABNORMAL HIGH (ref 70–99)

## 2022-05-26 LAB — MAGNESIUM: Magnesium: 2.2 mg/dL (ref 1.7–2.4)

## 2022-05-26 LAB — C-REACTIVE PROTEIN: CRP: 6.9 mg/dL — ABNORMAL HIGH (ref <1.0)

## 2022-05-26 LAB — D-DIMER, QUANTITATIVE: D-Dimer, Quant: 2.38 ug/mL-FEU — ABNORMAL HIGH (ref 0.00–0.50)

## 2022-05-26 MED ORDER — POTASSIUM CHLORIDE CRYS ER 20 MEQ PO TBCR
40.0000 meq | EXTENDED_RELEASE_TABLET | Freq: Once | ORAL | Status: DC
Start: 2022-05-26 — End: 2022-05-26

## 2022-05-26 MED ORDER — ACETAMINOPHEN 325 MG PO TABS: 650.0000 mg | ORAL_TABLET | ORAL | Status: DC | PRN

## 2022-05-26 MED ORDER — ONDANSETRON HCL 4 MG/2ML IJ SOLN: 4.0000 mg | Freq: Four times a day (QID) | INTRAMUSCULAR | Status: DC | PRN

## 2022-05-26 MED ORDER — BUTALBITAL-APAP-CAFFEINE 50-325-40 MG PO TABS
1.0000 | ORAL_TABLET | Freq: Four times a day (QID) | ORAL | Status: DC | PRN
  Administered 2022-05-26 – 2022-06-15 (×18): 1 via ORAL
  Filled 2022-05-26 (×20): qty 1

## 2022-05-26 MED ORDER — PROCHLORPERAZINE EDISYLATE 10 MG/2ML IJ SOLN
10.0000 mg | Freq: Four times a day (QID) | INTRAMUSCULAR | Status: DC | PRN
  Administered 2022-05-27 – 2022-05-31 (×8): 10 mg via INTRAVENOUS
  Filled 2022-05-26 (×8): qty 2

## 2022-05-26 MED ORDER — ACETAMINOPHEN 325 MG PO TABS
325.0000 mg | ORAL_TABLET | ORAL | Status: DC | PRN
  Administered 2022-05-26: 650 mg via ORAL
  Filled 2022-05-26: qty 2

## 2022-05-26 MED ORDER — POTASSIUM CHLORIDE CRYS ER 20 MEQ PO TBCR
40.0000 meq | EXTENDED_RELEASE_TABLET | Freq: Once | ORAL | Status: AC
  Administered 2022-05-26: 40 meq via ORAL
  Filled 2022-05-26: qty 2

## 2022-05-26 NOTE — Progress Notes (Signed)
Patient with h/o CAD, HTN, HLD, and chronic back pain who was admitted to [facility] from 7/3-11 following a fall after 6/15 lumbar fusion.  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  She was eventually stabilized and transferred to CIR.  She has been doing rehab post-stroke.  This AM with flu-like symptoms, low-grade fever to 102.5.  COVID is positive.  He would like for her transfer back to the acute hospital for treatment of COVID.  Since it is late in the day and she is unlikely to be transferred prior to the end of this shift, will have the patient paged back out upon arrival on the floor for H&P.  Carlyon Shadow, M.D.

## 2022-05-26 NOTE — Significant Event (Addendum)
Rapid Response Event Note   Reason for Call :  Fever, delusion  Initial Focused Assessment:  Pt lying in bed, AO. She is intermittently having delusions. Lung sounds are clear. Pt endorses abdominal pain and nausea. Abdomen is tender to palpation, generalized. Abdomen is soft, positive for bowel sounds.   VS: T 102.56F, BP 183/64, HR 89, RR 18, SpO2 96% on room air  CBG: 122  Interventions:  -CBG -Lactic -Tylenol for fever -Bladder scan: 37m   CXR completed earlier, blood cultures ordered. Pending respiratory panel.   Plan of Care:  -Reevaluate temperature 1 hour following PO Tylenol -Follow-up with results of lactic acid  Call rapid response for additional needs  Addendum 05/26/2022 1630:   Repeat temperature 100.38F. Pt assessment unchanged from above. Pt respiratory panel positive for Covid. MD has placed orders for admission to acute care.  Event Summary:  MD Notified: Dr. SCurlene DolphinCall Time: 1[place]Time: [place]: [place] RN

## 2022-05-26 NOTE — Progress Notes (Signed)
Inpatient Rehabilitation Discharge Medication Review by a Pharmacist  A complete drug regimen review was completed for this patient to identify any potential clinically significant medication issues.  Medications from Inpatient Rehabilitation most recent medication admistration record:   High Risk Drug Classes Is patient taking? Indication by Medication  Antipsychotic No   Anticoagulant No   Antibiotic No   Opioid Yes Hydrocodone-APAP- pain management  Antiplatelet Yes Aspirin, plavix- CVA prophylaxis   Hypoglycemics/insulin No   Vasoactive Medication Yes Norvasc, hygroton, catapres, apresoline- hypertension  Chemotherapy No   Other Yes Trazodone- sleep Xanax- anxiety Fioricet- HA Zetia, lipitor- HLD Topamax- HA prophylaxis     Type of Medication Issue Identified Description of Issue Recommendation(s)  Drug Interaction(s) (clinically significant)     Duplicate Therapy     Allergy     No Medication Administration End Date     Incorrect Dose     Additional Drug Therapy Needed     Significant med changes from prior encounter (inform family/care partners about these prior to discharge).    Other  Currently no medications ordered on readmission to [facility].  05/26/2022 9:48 PM -  Will follow up for restart plan:  -  Aspirin, plavix- CVA prophylaxis. Trazodone- sleep Xanax- anxiety Fioricet- HA Zetia, lipitor- HLD Topamax- HA prophylaxis Norvasc, hygroton, catapres, apresoline- hypertension.  Famotidine- GERD Meclizine- dizziness Methocarbalmol- muscle spasms Multivitamin Hydrocodone-APAP -pain management  PTA meds: Benicar 20 mg qday Follow up MD's assessment and plans for restart of medications.        Restart PTA meds when and if necessary during CIR admission or at time of discharge, if warranted    Clinically significant medication issues were identified that warrant physician communication and completion of prescribed/recommended actions by  midnight of the next day:  Yes --currently no medications ordered  or resumed on admission to [facility].   Time spent performing this drug regimen review (minutes):  [place], [facility] Clinical Pharmacist 05/26/2022 9:20 PM  Contact: [phone]  Please check AMION for all [place] phone numbers After 10:00 PM, call [place] [phone]    ADDENDUM :  Clinically significant medication issues  noted above were addressed/ medications listed above were resumed with these exceptions or differences.  -  Oxycodone-APAP instead of hydrocodone-APAP for pain management.  - Alprazolam, trazodone not resumed - Multivitamin not resumed - Hygroton (chlorthalidone) - not resumed.  - Atorvastatin - held and restart date moved to 06/02/22 , holding for 5 days during paxlovid treatment  Contacted and discussed the medications not yet resumed,  with Dr. Maylene Roes.  Alprazolam and chlorthalidone  now resumed.   Nicole Cella, RPh Clinical Pharmacist 05/27/2022 3:37 PM

## 2022-05-26 NOTE — Progress Notes (Signed)
Pt is on unit and RN messaged admitting to have orders put in.   Robin Arellano

## 2022-05-26 NOTE — Progress Notes (Signed)
SARS results relayed to Dr. Marciano Sequin.

## 2022-05-26 NOTE — Progress Notes (Signed)
Pt is alert and oriented to 3 at baseline since fever has resolved. Pt's dizziness has improved. Pt continues to c/o lower back pain, and generalized aches and pains. Pt denies HA or blurry vision. Resp are even and unlabored. No acute distress noted at this time. Updated vitals noted in flowsheets. 163/73, HR 110, Temp 98.7 and Resp 18. Pt prepared to DC to 5 MW Rm 7. Report given to [facility]. Pt transported with Verdis Frederickson NT to 5 MW.

## 2022-05-26 NOTE — IPOC Note (Signed)
Overall Plan of Care [facility]) Patient Details Name: Robin Arellano MRN: [id number] DOB: Feb 19, 1951  Admitting Diagnosis: Acute left PCA stroke [facility])  Hospital Problems: Principal Problem:   Acute left PCA stroke ([place])     Functional Problem List: Nursing Bowel, Endurance, Pain, Safety, Medication Management  PT Balance, Motor, Safety, Behavior, Nutrition, Sensory, Edema, Pain, Skin Integrity, Endurance, Perception  OT Balance, Behavior, Perception, Cognition, Safety, Endurance, Motor, Vision  SLP Cognition  TR         Basic ADL's: OT Bathing, Dressing, Toileting, Grooming     Advanced  ADL's: OT Simple Meal Preparation     Transfers: PT Bed Mobility, Bed to Chair, Car, Manufacturing systems engineer, Metallurgist: PT Ambulation, Stairs     Additional Impairments: OT Fuctional Use of Upper Extremity (potentially very minimal hemi-paresis)  SLP Communication, Social Cognition expression Problem Solving, Awareness, Memory, Attention  TR      Anticipated Outcomes Item Anticipated Outcome  Self Feeding modified independence  Swallowing      Basic self-care  modified independence & supervision with bathing  Toileting  modified independence   Bathroom Transfers modified independence  Bowel/Bladder  manage bowel w mod I assist  Transfers  mod-I using LRAD  Locomotion  supervision using LRAD  Communication     Cognition  Min A  Pain  pain at or below level4 with prns  Safety/Judgment  maintain safety w cues   Therapy Plan: PT Intensity: Minimum of 1-2 x/day ,45 to 90 minutes PT Frequency: 5 out of 7 days PT Duration Estimated Length of Stay: 10-12 days OT Intensity: Minimum of 1-2 x/day, 45 to 90 minutes OT Frequency: 5 out of 7 days OT Duration/Estimated Length of Stay: 7-10 days SLP Intensity: Minumum of 1-2 x/day, 30 to 90 minutes SLP Frequency: 3 to 5 out of 7 days SLP Duration/Estimated Length of Stay: 7-10   Team Interventions: Nursing  Interventions Bowel Management, Patient/Family Education, Disease Management/Prevention, Medication Management, Discharge Planning, Pain Management  PT interventions Ambulation/gait training, Balance/vestibular training, Cognitive remediation/compensation, Community reintegration, Discharge planning, Disease management/prevention, DME/adaptive equipment instruction, Functional electrical stimulation, Functional mobility training, Neuromuscular re-education, Pain management, Patient/family education, Psychosocial support, Skin care/wound management, Splinting/orthotics, Stair training, Therapeutic Activities, Therapeutic Exercise, UE/LE Strength taining/ROM, UE/LE Coordination activities, Visual/perceptual remediation/compensation  OT Interventions Balance/vestibular training, Discharge planning, Self Care/advanced ADL retraining, Therapeutic Activities, UE/LE Coordination activities, Cognitive remediation/compensation, Functional mobility training, Patient/family education, Therapeutic Exercise, Visual/perceptual remediation/compensation, Community reintegration, DME/adaptive equipment instruction, Neuromuscular re-education, UE/LE Strength taining/ROM  SLP Interventions Cognitive remediation/compensation, Cueing hierarchy, Functional tasks, Medication managment, Patient/family education, Internal/external aids  TR Interventions    SW/CM Interventions Discharge Planning, Psychosocial Support, Patient/Family Education   Barriers to Discharge MD  Medical stability  Nursing Home environment access/layout 1 level 5 ste bil rails with spouse and daughter  PT Inaccessible home environment, Decreased caregiver support    OT      SLP      SW Insurance for SNF coverage     Team Discharge Planning: Destination: PT-Home ,OT- Home , SLP-Home Projected Follow-up: PT-Outpatient PT, 24 hour supervision/assistance, OT-  Outpatient OT, 24 hour supervision/assistance, SLP-24 hour supervision/assistance,  Outpatient SLP Projected Equipment Needs: PT-To be determined, OT-  , SLP-None recommended by SLP Equipment Details: PT- , OT-Pt has shower chair, grab bars around toilet & shower, raised toilet seat, FWW, SPC. Patient/family involved in discharge planning: PT- Patient,  OT-Patient, SLP-Patient  MD ELOS: 10 days Medical Rehab Prognosis:  Excellent Assessment: The  patient has been admitted for CIR therapies with the diagnosis of CVA. The team will be addressing functional mobility, strength, stamina, balance, safety, adaptive techniques and equipment, self-care, bowel and bladder mgt, patient and caregiver education. Goals have been set at supervision. Anticipated discharge destination is home.        See Team Conference Notes for weekly updates to the plan of care

## 2022-05-26 NOTE — Progress Notes (Signed)
Pt with c/o of body aches, chills, and generalized fatigue. Pt noted with facial flushing. Vitals assessed. Temp is 99.7, other VS are at baseline. Dr. Marciano Sequin made aware. New order in place and in progress.   Gerald Stabs, RN

## 2022-05-26 NOTE — Progress Notes (Signed)
Occupational Therapy Session Note  Patient Details  Name: Robin Arellano MRN: [id number] Date of Birth: Jul 10, 1951  Today's Date: 05/26/2022 OT Individual Time: 8841-6606 OT Individual Time Calculation (min): 61 min    Short Term Goals: Week 1:  OT Short Term Goal 1 (Week 1): Pt will complete grooming at the sink with close supervision for safety with use of FWW for standing balance. OT Short Term Goal 2 (Week 1): Pt will complete UB dressing and LB dressing with close supervision with use of FWW for clothing retrieval/standing and AE as needed. OT Short Term Goal 3 (Week 1): Pt will complete toileting with close supervision and use of FWW as needed. OT Short Term Goal 4 (Week 1): Pt will complete toilet transfer with close supervision and use of FWW as needed. OT Short Term Goal 5 (Week 1): Pt will complete bathing with close supervision using compensatory techniques and AE as needed to improve safety and maintain spinal precautions.  Skilled Therapeutic Interventions/Progress Updates:  Pt asleep in bed upon OT arrival to the room. Pt reports, "Why do I need occupational therapy? I don't need typing. Wasn't you that gave me typing? I'm retired." Pt in agreement for OT session. Pt reports, "I have the flu. I just know it. I don't feel good." Pt reports that RN is aware.   Therapy Documentation Precautions:  Precautions Precautions: Back, Fall (recent spinal L4-L5 sx on 04/26/22) Precaution Booklet Issued: No Required Braces or Orthoses: Spinal Brace Spinal Brace: Applied in sitting position, Lumbar corset, Other (comment) Spinal Brace Comments: LSO in room Restrictions Weight Bearing Restrictions: No Other Position/Activity Restrictions: Brace in room - LSO Vital Signs (most recent vitals charted by nursing): Therapy Vitals Temp: 98.2 F (36.8 C) Pulse Rate: 73 Resp: 18 BP: (!) 150/74 Patient Position (if appropriate): Lying Oxygen Therapy SpO2: 96 % O2 Device: Room  Air Pain: Pain Assessment Pain Scale: 0-10 Pain Score: 5  Pain Type: Acute pain Pain Location: Back Pain Orientation: Lower Pain Descriptors / Indicators: Aching Pain Onset: On-going Pain Intervention(s): Distraction;Emotional support (Pt reported getting pain medication earlier in the morning)  ADL: Grooming: Contact guard (Pt able to complete hand hygiene with CGA standing at the sink with FWW.) Toileting: Contact guard (Pt able to complete 3/3 toileting tasks with CGA for standing balance with use of FWW.) Where Assessed-Toileting: Glass blower/designer: Contact guard (Pt able to complete ambulatory transfer from EOB > toilet with use of FWW.) Toilet Transfer Method: Counselling psychologist: Grab bars ADL Comments: Pt able to complete various ambulatory transfers from EOB > toilet > sink > EOB with CGA and use of FWW. However, pt does demo increased motor control difficulty with ambulation and s/s of increased weakness today than on initial evalutation. Education provided to nurse.  Visual Testing: Pt reports increased deficits with vision in R eye. OT performs pieces of visual assessment. Pt demo's varying acuity levels with binocular vision and monocular vision. See the following acuity results for far point vision with glasses on:  -binocular: 20/25 -monocular with L eye closed: 20/30 -monocular with R eye closed: 20/25  Pt unable to tolerate more visual testing due to report of dizziness and fatigue. Pt able to perform supine <> sit transfer with supervision.   Pt returned to bed at end of session. Pt left resting comfortably in bed with personal belongings and call light within reach, bed alarm on and activated, bed in low position, 3 bed rails up, and comfort needs attended to.  Therapy/Group: Individual Therapy  Barbee Shropshire 05/26/2022, 5:56 PM

## 2022-05-26 NOTE — Progress Notes (Signed)
   05/26/22 1530  Assess: MEWS Score  Temp (!) 102.6 F (39.2 C)  BP (!) 183/64  MAP (mmHg) 98  Pulse Rate 89  Resp 18  Level of Consciousness Alert  SpO2 96 %  O2 Device Room Air  Assess: MEWS Score  MEWS Temp 2  MEWS Systolic 0  MEWS Pulse 0  MEWS RR 0  MEWS LOC 0  MEWS Score 2  MEWS Score Color Yellow  Assess: if the MEWS score is Yellow or Red  Were vital signs taken at a resting state? Yes  Focused Assessment No change from prior assessment  Does the patient meet 2 or more of the SIRS criteria? Yes  Does the patient have a confirmed or suspected source of infection? No  MEWS guidelines implemented *See Row Information* Yes  Treat  MEWS Interventions Administered prn meds/treatments (tylenol given for fever)  Pain Scale Faces  Faces Pain Scale 4  Pain Type Acute pain  Pain Location Abdomen  Pain Orientation Lower  Pain Descriptors / Indicators Aching  Pain Frequency Intermittent  Pain Onset On-going  Patients Stated Pain Goal 2  Pain Intervention(s) Medication (See eMAR)  Take Vital Signs  Increase Vital Sign Frequency  Yellow: Q 2hr X 2 then Q 4hr X 2, if remains yellow, continue Q 4hrs  Escalate  MEWS: Escalate Yellow: discuss with charge nurse/RN and consider discussing with provider and RRT  Notify: Charge Nurse/RN  Name of Charge Nurse/RN Notified Benjie Karvonen, RN  Date Charge Nurse/RN Notified 05/26/22  Time Charge Nurse/RN Notified 1530  Notify: Provider  Provider Name/Title Dr. Marciano Sequin  Date Provider Notified 05/26/22  Time Provider Notified 1206  Method of Notification Call  Notification Reason Change in status  Provider response At bedside;See new orders  Date of Provider Response 05/26/22  Time of Provider Response 1530  Notify: Rapid Response  Name of Rapid Response RN Notified [place], [place] RN  Date Rapid Response Notified 05/26/22  Time Rapid Response Notified 1520  Document  Patient Outcome Other (Comment) (orders are underway.)  Progress  note created (see row info) Yes  Assess: SIRS CRITERIA  SIRS Temperature  1  SIRS Pulse 0  SIRS Respirations  0  SIRS WBC 0  SIRS Score Sum  1

## 2022-05-26 NOTE — Progress Notes (Signed)
Speech Language Pathology Daily Session Note  Patient Details  Name: Doris Santita Hunsberger MRN: [id number] Date of Birth: 09/22/1951  Today's Date: 05/26/2022 SLP Individual Time: 5465-0354 SLP Individual Time Calculation (min): 59 min  Short Term Goals: Week 1: SLP Short Term Goal 1 (Week 1): STG=LTG due to short ELOS (7-10 days)  Skilled Therapeutic Interventions: Pt seen for skilled ST with focus on cognitive goals, pt in bed complaining of feeling sick (states daughter has flu-like symptoms and visited yesterday) but agreeable to therapeutic tasks. Pt reports chest and back pain, has been pre-medicated and states "It will be ok soon". SLP facilitating ongoing med management tasks by providing Max A verbal cues to recall current medication regime and baseline prescriptions. Pt requesting to go to the bathroom, Supervision A to don back brace correctly and for safety cues during ambulation. Pt with continent bladder void in the toilet and washing hands before returning to bed. Pt reports "see just that took so much out of me". SLP and pt discussing fall prevention and energy conservation strategies, pt benefits from min A cues to recall with delay. SLP facilitating simple alternating attention task by providing mod A verbal cues, cues mostly due to decreased motivation for accuracy. Pt at times apathetic when provided incorrect responses (I.e. goal is to provide a name starting with "h" and patient replied "hospital". When SLP attempting to correct pt shrugged shoulders and states "I like that for a name, I would name my kid that"). Pt left in bed with alarm set and all needs within reach. Cont ST POC.  Pain Pain Assessment Pain Scale: 0-10 Pain Score: 7  Pain Type: Acute pain Pain Location: Back  Therapy/Group: Individual Therapy  Dewaine Conger 05/26/2022, 9:39 AM

## 2022-05-26 NOTE — Progress Notes (Signed)
Physical Therapy Session Note  Patient Details  Name: Robin Arellano MRN: [id number] Date of Birth: Jul 23, 1951  Today's Date: 05/26/2022 PT Missed Time: 63 Minutes Missed Time Reason: Patient ill (Comment);MD hold (Comment) (fever of 101.4)  Pt unable to participate in skilled physical therapy session at this time due to a fever and MD assessing for possible flu vs COVID. Will follow-up for therapy when appropriate. Missed 45 minutes of skilled physical therapy.   Tawana Scale , PT, DPT, NCS, CSRS 05/26/2022, 3:31 PM

## 2022-05-26 NOTE — H&P (Signed)
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  Of note, patient was recently hospitalized on the medicine service at [facility] from 7/3 until 7/11.  Patient initially presented status post fall with left leg weakness and was found to be suffering from a acute/subacute left PCA infarct.  During the hospitalization patient was additionally found to have a penetrating atherosclerotic ulcer of the descending thoracic aorta.  Patient was evaluated by vascular surgery and they felt that emergent surgical intervention was not indicated that patient would need to follow-up with vascular surgery as an outpatient.  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  Patient was evaluated by cardiothoracic surgery and was recommended the patient follow-up as an outpatient.  Patient was discharged to inpatient rehab on 7/11.  Patient was progressing well and participating in rehab.  Patient is now been experiencing a 2-day history of generalized weakness and severe myalgias.  Patient states that over the same span of time she has developed associated dry nonproductive cough and dyspnea with exertion.  Patient also complains of  associated poor oral intake.  The morning of 7/15 the patient began to vomit and experienced a fever of 102.5 F.  With concern  for an infectious process.   While in the rehab floor chest x-ray was performed and found to be unremarkable.  COVID-19 PCR testing was performed and was found to be positive consistent with COVID-19 infection.  The hospitalist group was called and patient was excepted for transfer back to the medical service for management of COVID-19.    Review of Systems: Review of Systems  Constitutional:  Positive for fever and malaise/fatigue.  Musculoskeletal:  Positive for myalgias.  Neurological:  Positive for weakness.     Past Medical History:  Diagnosis Date   Anxiety    on meds   Back pain    Chronic female pelvic pain    Coronary artery disease    mild, non-obstructive 11/2018   Depression    on meds   Family history of adverse reaction to anesthesia    sister had difficulty waking up   Fibromyalgia    H/O leukocytosis    Headache    Hyperlipidemia    on meds   Hypertension    on meds   MI (myocardial infarction) ([place])    Pt states she did not have a MI- EKG was normal, was GERD   Osteoarthritis    on meds   Ovarian cyst, right    PONV (postoperative nausea and vomiting)    Post-operative nausea and vomiting    SVD (spontaneous vaginal delivery)    x 2   Vitamin D deficiency     Past Surgical History:  Procedure Laterality Date   ABDOMINAL HYSTERECTOMY  1994   TAH.BSO  ANTERIOR CERVICAL DECOMP/DISCECTOMY FUSION  2019   APPENDECTOMY  1975   BACK SURGERY  2023   BIOPSY  05/12/2020   Procedure: BIOPSY;  Surgeon: Milus Banister, MD;  Location: [facility];  Service: Endoscopy;;   CARDIAC CATHETERIZATION  2020   CHOLECYSTECTOMY N/A 07/21/2020   Procedure: LAPAROSCOPIC CHOLECYSTECTOMY WITH INTRAOPERATIVE CHOLANGIOGRAM;  Surgeon: [name], MD;  Location: [facility];  Service: General;  Laterality: N/A;   COLONOSCOPY  08/12/2017   Hx TA  (piecemeal)Jacobs-MAC-suprep (good)   ESOPHAGOGASTRODUODENOSCOPY (EGD) WITH PROPOFOL N/A 05/12/2020   Procedure: ESOPHAGOGASTRODUODENOSCOPY (EGD) WITH PROPOFOL;  Surgeon: Milus Banister, MD;  Location: [facility];  Service: Endoscopy;  Laterality: N/A;   EUS N/A 05/12/2020   Procedure: UPPER ENDOSCOPIC ULTRASOUND (EUS) RADIAL;  Surgeon: Milus Banister, MD;  Location: [facility];  Service: Endoscopy;  Laterality: N/A;   KNEE SURGERY Bilateral 1996   x 2 - arthroscopic   LEFT HEART CATH AND CORONARY ANGIOGRAPHY N/A 11/25/2018   Procedure: LEFT HEART CATH AND CORONARY ANGIOGRAPHY;  Surgeon: Troy Sine, MD;  Location: [facility];  Service: Cardiovascular;  Laterality: N/A;   PELVIC LAPAROSCOPY  1989   W LYSIS OF ADHESIONS/L SALPINGONEOSTOMY   TUBAL LIGATION     WISDOM TOOTH EXTRACTION      Social History:  reports that she quit smoking about 23 years ago. Her smoking use included cigarettes. She has a 3.00 pack-year smoking history. She has never used smokeless tobacco. She reports that she does not currently use alcohol. She reports that she does not use drugs.  Allergies  Allergen Reactions   Flexeril [Cyclobenzaprine] Anaphylaxis, Hives, Itching and Swelling   Lidocaine Hives   Zanaflex [Tizanidine] Anaphylaxis, Hives, Itching and Swelling   Gabapentin Swelling    Swelling and itching   Latex Rash    Family History  Problem Relation Age of Onset   Uterine cancer Mother 64   Aneurysm Father    Heart disease Paternal Grandfather    COPD Brother    Skin cancer Brother    Other Sister        MGUS    Skin cancer Brother    Other Sister        MA   Colon cancer Neg Hx    Rectal cancer Neg Hx    Stomach cancer Neg Hx    Stroke Neg Hx    Neuropathy Neg Hx    Colon polyps Neg Hx    Esophageal cancer Neg Hx     Prior to Admission medications   Medication Sig Start Date End Date Taking? Authorizing Provider  ALPRAZolam Duanne Moron) 1 MG tablet Take 0.5 mg by  mouth daily as needed for sleep or anxiety. 03/09/22   [provider]  amLODipine (NORVASC) 10 MG tablet TAKE 1 TABLET(10 MG) BY MOUTH DAILY Patient taking differently: Take 10 mg by mouth daily. 10/30/21   Troy Sine, MD  Ascorbic Acid (VITAMIN C) 1000 MG tablet Take 1,000 mg by mouth in the morning.    [provider]  aspirin EC 81 MG tablet Take 81 mg by mouth as needed (chest pain). Swallow whole.    [provider]  atorvastatin (LIPITOR) 40 MG tablet Take 1 tablet (40 mg total) by mouth daily. 05/21/22   Bonnielee Haff, MD  Calcium Carbonate Antacid (TUMS PO) Take 2 tablets by mouth daily as needed (stomach pain).    [provider]  Cholecalciferol (VITAMIN D) 50 MCG (2000 UT) tablet Take 2,000  Units by mouth in the morning.    [provider]  cloNIDine (CATAPRES) 0.1 MG tablet Take 1 tablet (0.1 mg total) by mouth at bedtime. 05/21/22   Bonnielee Haff, MD  clopidogrel (PLAVIX) 75 MG tablet Take 1 tablet (75 mg total) by mouth daily for 16 days. 05/22/22 06/07/22  Bonnielee Haff, MD  ezetimibe (ZETIA) 10 MG tablet TAKE 1 TABLET(10 MG) BY MOUTH DAILY Patient taking differently: Take 10 mg by mouth daily. 11/29/21   Troy Sine, MD  hydrALAZINE (APRESOLINE) 100 MG tablet Take 1 tablet (100 mg total) by mouth 3 (three) times daily. Patient taking differently: Take 100 mg by mouth in the morning. 10/04/21   Troy Sine, MD  methocarbamol (ROBAXIN) 500 MG tablet Take 1 tablet (500 mg total) by mouth every 6 (six) hours as needed for muscle spasms. 04/27/22   Kristeen Miss, MD  nystatin (MYCOSTATIN) 100000 UNIT/ML suspension Take 5 mLs (500,000 Units total) by mouth 4 (four) times daily. 05/21/22   Bonnielee Haff, MD  oxyCODONE-acetaminophen (PERCOCET/ROXICET) 5-325 MG tablet Take 1 tablet by mouth every 6 (six) hours as needed for moderate pain or severe pain. 05/21/22   Bonnielee Haff, MD  polyethylene glycol powder (GLYCOLAX/MIRALAX) 17  GM/SCOOP powder Take 17 g by mouth daily as needed. 05/21/22   Bonnielee Haff, MD  topiramate (TOPAMAX) 25 MG tablet Take 1 tablet (25 mg total) by mouth 2 (two) times daily. 05/21/22   Bonnielee Haff, MD  valACYclovir (VALTREX) 500 MG tablet TAKE 1 TABLET BY MOUTH TWICE DAILY FOR 3 TO 5 DAYS THEN TAKE DAILY AS NEEDED Patient taking differently: Take 500 mg by mouth 2 (two) times daily as needed (shingles flair up). 12/10/19   Huel Cote, NP    Physical Exam:  Vitals:   05/26/22 2256  Weight: 64.6 kg  Height: _0  (1.6 m)    Constitutional: Awake alert and oriented x3, no associated distress.   Skin: no rashes, no lesions, good skin turgor noted. Eyes: Pupils are equally reactive to light.  No evidence of scleral icterus or conjunctival pallor.  ENMT: Moist mucous membranes noted.  Posterior pharynx clear of any exudate or lesions.   Neck: normal, supple, no masses, no thyromegaly.  No evidence of jugular venous distension.   Respiratory: clear to auscultation bilaterally, no wheezing, no crackles. Normal respiratory effort. No accessory muscle use.  Cardiovascular: Regular rate and rhythm, no murmurs / rubs / gallops. No extremity edema. 2+ pedal pulses. No carotid bruits.  Chest:   Nontender without crepitus or deformity.   Back:   Nontender without crepitus or deformity. Abdomen:.  Diffuse abdominal tenderness, abdomen is soft however.  No evidence of intra-abdominal masses.  Positive bowel sounds noted in all quadrants.   Musculoskeletal: Diffuse tenderness of the extremities out of proportion with exam.  No joint deformity upper and lower extremities. Good ROM, no contractures. Normal muscle tone.  Neurologic: CN 2-12 grossly intact. Sensation intact.  Patient moving all 4 extremities spontaneously.  Patient is following all commands.  Patient is responsive to verbal stimuli.   Psychiatric: Patient exhibits normal mood with appropriate affect.  Patient seems to possess insight as  to their current situation.    Data Reviewed:  I have personally reviewed and interpreted labs, imaging.  Significant findings are:  COVID-19 PCR positive BMP reveals sodium 135, potassium 3.1, chloride 98, bicarbonate 20, glucose 101, BUN 18, creatinine 1.1 CBC with differential reveals white blood cell count 8.3, hemoglobin 10.5, hematocrit 30.7,  platelet count of 254 Lactic acid 0.6 Chest x-ray personally reviewed revealing no evidence of acute cardiopulmonary disease.  EKG: Personally reviewed.  Rhythm is NSR with heart rate of 85BPM. Qtc 47m  No dynamic ST segment changes appreciated.   Assessment and Plan: * COVID-19 virus infection Patient presenting with complaints of shortness of breath, malaise, weakness and dry nonproductive cough consistent with COVID-19 infection. COVID-19 PCR testing positive. Chest x-ray reveals no evidence of infiltrates  Airborne and contact isolation initiated. Providing patient with supplemental oxygen as needed for bouts of hypoxia with target oxygen saturation of 94%. Patient has been initiated on a 5-day course of Paxlovid to prevent viral illness from worsening No indication for systemic steroids at this time in the absence of significant hypoxia As needed bronchodilator therapy for shortness of breath and wheezing  As needed antitussives for cough  Hydrating patient gently with intravenous isotonic fluids  Following CRP, D-dimer,  CBC daily Blood cultures obtained Close clinical monitoring    Acute left PCA stroke (HCC) Continue Plavix, aspirin, statin therapy We will resume PT while hospitalized here Patient will need to be reassessed as to whether or not the need to go back to inpatient rehab once COVID-19 begins to improve   Essential hypertension Resume patients home regimen of oral antihypertensives Titrate antihypertensive regimen as necessary to achieve adequate BP control PRN intravenous antihypertensives for excessively  elevated blood pressure    Coronary artery disease involving native coronary artery of native heart without angina pectoris Patient is currently chest pain free Monitoring patient on telemetry Continue home regimen of antiplatelet therapy, lipid lowering therapy   Chronic kidney disease, stage 3b (HCC) Strict intake and output monitoring Creatinine near baseline Minimizing nephrotoxic agents as much as possible Serial chemistries to monitor renal function and electrolytes   Mixed hyperlipidemia Continuing home regimen of lipid lowering therapy.        Code Status:  Full code  code status decision has been confirmed with: patient Family Communication: deferred   Consults: none  Severity of Illness:  The appropriate patient status for this patient is INPATIENT. Inpatient status is judged to be reasonable and necessary in order to provide the required intensity of service to ensure the patient's safety. The patient's presenting symptoms, physical exam findings, and initial radiographic and laboratory data in the context of their chronic comorbidities is felt to place them at high risk for further clinical deterioration. Furthermore, it is not anticipated that the patient will be medically stable for discharge from the hospital within 2 midnights of admission.   * I certify that at the point of admission it is my clinical judgment that the patient will require inpatient hospital care spanning beyond 2 midnights from the point of admission due to high intensity of service, high risk for further deterioration and high frequency of surveillance required.*  Author:  GVernelle EmeraldMD  05/27/2022 12:19 AM

## 2022-05-26 NOTE — Progress Notes (Signed)
Physical Therapy Session Note  Patient Details  Name: Robin Arellano MRN: [id number] Date of Birth: 11-Oct-1951  Today's Date: 05/26/2022 PT Individual Time: missed time 45 minutes    Short Term Goals: Week 1:  PT Short Term Goal 1 (Week 1): Pt will perform supine<>sit with supervision PT Short Term Goal 2 (Week 1): Pt will perform sit<>stand transfers using LRAD with supervision PT Short Term Goal 3 (Week 1): Pt will perform bed<>chair transfers using LRAD with supervision PT Short Term Goal 4 (Week 1): Pt will ambulate at least 134f using LRAD with no more than CGA PT Short Term Goal 5 (Week 1): Pt will navigate 4 steps using HRs per home set-up with no more than CGA  Skilled Therapeutic Interventions/Progress Updates:  Pt in bed on arrival with nurse and tech at bedside. Pt stating she is cold and noted on vitals to have temp of 100.1. RN reports this is higher as it's been low grade today with pt reporting feeling achy and tired. Of note her daughter visited recently who reported just getting over flu. MD has ordered labs to R/O flu and may add in Covid testing as well. PT session held at this time due to change in medical condition and will follow up at at later time/date. Pt left in bed with needs in reach, alarm on and NT at bedside.   Therapy Documentation Precautions:  Precautions Precautions: Back, Fall (recent spinal L4-L5 sx on 04/26/22) Precaution Booklet Issued: No Required Braces or Orthoses: Spinal Brace Spinal Brace: Applied in sitting position, Lumbar corset, Other (comment) Spinal Brace Comments: LSO in room Restrictions Weight Bearing Restrictions: No Other Position/Activity Restrictions: Brace in room - LSO General: PT Amount of Missed Time (min): 428Minutes PT Missed Treatment Reason: Patient ill (Comment) Vital Signs: Therapy Vitals Temp: 100.1 F (37.8 C) Temp Source: Oral Pulse Rate: 76 Resp: 16 BP: (!) 183/62 Patient Position (if appropriate):  Lying Oxygen Therapy SpO2: 97 % O2 Device: Room Air Pain: Pain Assessment Pain Scale: 0-10 Pain Score: 5  Pain Type: Acute pain Pain Location: Back Pain Orientation: Lower Pain Descriptors / Indicators: Aching Pain Onset: On-going Pain Intervention(s): Distraction;Emotional support (Pt reported getting pain medication earlier in the morning)   Therapy/Group: Individual Therapy  KWillow Ora PTA, CSanta [id number] [place] [phone]/15/23, 1:19 PM

## 2022-05-26 NOTE — Progress Notes (Signed)
PROGRESS NOTE   Subjective/Complaints: Reports she feels achy today. Daughter visited her with flu yesterday. Low grade fever today 99.7. Reports occasional cough.   ROS: +postoperative spinal pain, +neuropathic pain in left glut and posterior thigh, stress level improved, + chills   Objective:   No results found. No results for input(s): "WBC", "HGB", "HCT", "PLT" in the last 72 hours.  No results for input(s): "NA", "K", "CL", "CO2", "GLUCOSE", "BUN", "CREATININE", "CALCIUM" in the last 72 hours.   Intake/Output Summary (Last 24 hours) at 05/26/2022 1220 Last data filed at 05/26/2022 0700 Gross per 24 hour  Intake 118 ml  Output --  Net 118 ml         Physical Exam: Vital Signs Blood pressure (!) 173/66, pulse 75, temperature 99.7 F (37.6 C), resp. rate 19, height 5' 2.99" (1.6 m), weight 71 kg, SpO2 97 %. Gen: no distress, normal appearing, overweight BMI 27.73 HEENT: oral mucosa pink and moist, NCAT, R hemianopsia noted  Cardio: RRR Chest: CTAB, normal effort, normal rate of breathing Abdominal:     General: Abdomen is flat. Bowel sounds are normal. There is no distension.     Palpations: Abdomen is soft.     Tenderness: There is abdominal tenderness.     Comments: Multpile injection bruises All over the abdomen  Musculoskeletal:     Cervical back: Neck supple. No tenderness.     Right lower leg: No edema.     Left lower leg: No edema.     Comments: RUE- 4+/5 throughout LUE 5/5 RLE_ 4-/5 throughout LLE- 5/5   Skin:    General: Skin is warm and dry.     Comments: Back incision continues to heal nicely Abd bruising as detailed above  Neurological:     Mental Status: She is alert and oriented to person, place, and time. Impaired attention, tangential    Comments: Intact to light touch in all 4 extremties  Psychiatric:     Comments: Slightly decreased memory     Assessment/Plan: 1. Functional  deficits which require 3+ hours per day of interdisciplinary therapy in a comprehensive inpatient rehab setting. Physiatrist is providing close team supervision and 24 hour management of active medical problems listed below. Physiatrist and rehab team continue to assess barriers to discharge/monitor patient progress toward functional and medical goals  Care Tool:  Bathing    Body parts bathed by patient: Right arm, Left arm, Chest, Abdomen, Front perineal area, Buttocks, Face, Left lower leg, Right lower leg, Left upper leg, Right upper leg         Bathing assist Assist Level: Supervision/Verbal cueing     Upper Body Dressing/Undressing Upper body dressing   What is the patient wearing?: Pull over shirt    Upper body assist Assist Level: Set up assist    Lower Body Dressing/Undressing Lower body dressing      What is the patient wearing?: Pants     Lower body assist Assist for lower body dressing: Supervision/Verbal cueing     Toileting Toileting    Toileting assist Assist for toileting: Supervision/Verbal cueing     Transfers Chair/bed transfer  Transfers assist     Chair/bed transfer  assist level: Supervision/Verbal cueing Chair/bed transfer assistive device: Programmer, multimedia   Ambulation assist      Assist level: Minimal Assistance - Patient > 75% Assistive device: Walker-rolling Max distance: 161f   Walk 10 feet activity   Assist     Assist level: Minimal Assistance - Patient > 75% Assistive device: Walker-rolling   Walk 50 feet activity   Assist    Assist level: Minimal Assistance - Patient > 75% Assistive device: Walker-rolling    Walk 150 feet activity   Assist    Assist level: Minimal Assistance - Patient > 75% Assistive device: Walker-rolling    Walk 10 feet on uneven surface  activity   Assist     Assist level: Minimal Assistance - Patient > 75% Assistive device: WBrewing technologist    Assist Is the patient using a wheelchair?: No             Wheelchair 50 feet with 2 turns activity    Assist            Wheelchair 150 feet activity     Assist          Blood pressure (!) 173/66, pulse 75, temperature 99.7 F (37.6 C), resp. rate 19, height 5' 2.99" (1.6 m), weight 71 kg, SpO2 97 %.    Medical Problem List and Plan: 1. Functional deficits secondary to acute left PCA infarct, recent L4-L5 lumbar fusion             -patient may  shower             -ELOS/Goals: 7-10 days supervision  Continue CIR 2.  Antithrombotics: -DVT/anticoagulation:  Pharmaceutical: Lovenox (changed from heparin)             -antiplatelet therapy: Aspirin and Plavix started 7/5 for 3 weeks then aspirin alone 3. Postsurgical pain: Topamax 25 mg twice daily             -Fioricet 1 every 6 hours as needed             -Robaxin 500 mg every 6 hours as needed             -Percocet 5/325 6 hours as needed 4. Mood/Behavior/Sleep: CSW to evaluate and provide emotional support             -Xanax 0.5 mg q HS as needed             -antipsychotic agents: n/a 5. Neuropsych/cognition: This patient is capable of making decisions on her own behalf. 6. Skin/Wound Care: Routine skin care checks 7. Fluids/Electrolytes/Nutrition: Routine Is and Os and follow-up chemistries 8.  Hypertension: Continue amlodipine 10 mg daily             - continue chlorthalidone 25 mg daily             -continue clonidine 0.1 mg nightly             -continue hydralazine 100 mg 3 times daily  -will not increase further given her bradycardia  -discussed her conversation with her husband to reduce his anger, which stresses her and increases her blood pressure.   -discussed we can try to wean off blood pressure medications if blood pressure improves  -BP continues to be elevated at times, continue to monitor trend 9.  Hyperlipidemia: LDL reviewed and is 101, contnue atorvastatin 40 mg  daily 10: Oral thrush: Continue nystatin suspension 4 times daily 11: Acute kidney  injury atop chronic kidney disease stage IIIb: Improved ; follow-up BMP.  Baseline serum creatinine 1.4-1.6 12: Mild hyponatremia: Follow-up BMP -BMP today 13: Anemia of chronic disease: Asymptomatic, follow-up CBC 14: Chronic back pain status post lumbar surgery 6/15             -Follow-up with Dr. 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 Bradycardia: medications reviewed and will maintain anti-hypertensives given her elevated blood pressure.  17. Recent 20 lbs: liberalized diet to regular 18. Left sided radiculopathy: will notify Dr. Ellene Route regarding return of presurgical pain.  19. Low grade fever, possible flu exposure  -Check respiratory panel  -CBC/BMP  -Tylenol  LOS: 4 days A FACE TO FACE EVALUATION WAS PERFORMED  Jennye Boroughs 05/26/2022, 12:20 PM

## 2022-05-26 NOTE — Progress Notes (Signed)
Pt refused meals all day today. Encouraging fluids and snacks.Pt states that "everything tastes nasty."  Fever trending down after tylenol recent temp 99.1. Norco given for back pain and meclizine given for dizziness. Pt currently resting in bed with all needs met.  Pt is to transfer to acute today.  Gerald Stabs, RN

## 2022-05-27 ENCOUNTER — Encounter (HOSPITAL_COMMUNITY): Payer: Self-pay | Admitting: Internal Medicine

## 2022-05-27 ENCOUNTER — Inpatient Hospital Stay (HOSPITAL_COMMUNITY): Payer: PPO

## 2022-05-27 DIAGNOSIS — N1832 Chronic kidney disease, stage 3b: Secondary | ICD-10-CM | POA: Diagnosis present

## 2022-05-27 DIAGNOSIS — E782 Mixed hyperlipidemia: Secondary | ICD-10-CM | POA: Diagnosis present

## 2022-05-27 DIAGNOSIS — L8990 Pressure ulcer of unspecified site, unspecified stage: Secondary | ICD-10-CM | POA: Insufficient documentation

## 2022-05-27 DIAGNOSIS — U071 COVID-19: Secondary | ICD-10-CM | POA: Diagnosis not present

## 2022-05-27 LAB — BASIC METABOLIC PANEL
Anion gap: 14 (ref 5–15)
BUN: 16 mg/dL (ref 8–23)
CO2: 19 mmol/L — ABNORMAL LOW (ref 22–32)
Calcium: 8.9 mg/dL (ref 8.9–10.3)
Chloride: 101 mmol/L (ref 98–111)
Creatinine, Ser: 1.72 mg/dL — ABNORMAL HIGH (ref 0.44–1.00)
GFR, Estimated: 31 mL/min — ABNORMAL LOW (ref >60)
Glucose, Bld: 94 mg/dL (ref 70–99)
Potassium: 3.4 mmol/L — ABNORMAL LOW (ref 3.5–5.1)
Sodium: 134 mmol/L — ABNORMAL LOW (ref 135–145)

## 2022-05-27 LAB — PROCALCITONIN: Procalcitonin: 0.31 ng/mL

## 2022-05-27 LAB — URINALYSIS, ROUTINE W REFLEX MICROSCOPIC
Bilirubin Urine: NEGATIVE
Glucose, UA: NEGATIVE mg/dL
Hgb urine dipstick: NEGATIVE
Ketones, ur: NEGATIVE mg/dL
Nitrite: POSITIVE — AB
Protein, ur: 30 mg/dL — AB
Specific Gravity, Urine: 1.011 (ref 1.005–1.030)
pH: 5 (ref 5.0–8.0)

## 2022-05-27 LAB — GLUCOSE, CAPILLARY
Glucose-Capillary: 96 mg/dL (ref 70–99)
Glucose-Capillary: 92 mg/dL (ref 70–99)

## 2022-05-27 MED ORDER — METHOCARBAMOL 500 MG PO TABS
500.0000 mg | ORAL_TABLET | Freq: Four times a day (QID) | ORAL | Status: DC | PRN
  Administered 2022-05-28 – 2022-06-01 (×10): 500 mg via ORAL
  Filled 2022-05-27 (×11): qty 1

## 2022-05-27 MED ORDER — ALPRAZOLAM 0.5 MG PO TABS
0.5000 mg | ORAL_TABLET | Freq: Every day | ORAL | Status: DC | PRN
  Administered 2022-05-29 – 2022-06-14 (×9): 0.5 mg via ORAL
  Filled 2022-05-27 (×12): qty 1

## 2022-05-27 MED ORDER — MENTHOL 3 MG MT LOZG
1.0000 | LOZENGE | OROMUCOSAL | Status: DC | PRN
Start: 2022-05-27 — End: 2022-06-16
  Administered 2022-05-27 – 2022-06-09 (×2): 3 mg via ORAL
  Filled 2022-05-27 (×5): qty 9

## 2022-05-27 MED ORDER — ACETAMINOPHEN 325 MG PO TABS
650.0000 mg | ORAL_TABLET | Freq: Four times a day (QID) | ORAL | Status: DC | PRN
  Administered 2022-05-28 – 2022-06-12 (×9): 650 mg via ORAL
  Filled 2022-05-27 (×9): qty 2

## 2022-05-27 MED ORDER — NIRMATRELVIR/RITONAVIR (PAXLOVID) TABLET (RENAL DOSING)
2.0000 | ORAL_TABLET | Freq: Two times a day (BID) | ORAL | Status: DC
  Administered 2022-05-27 – 2022-05-29 (×6): 2 via ORAL
  Filled 2022-05-27: qty 20

## 2022-05-27 MED ORDER — IOHEXOL 350 MG/ML SOLN
60.0000 mL | Freq: Once | INTRAVENOUS | Status: AC | PRN
  Administered 2022-05-27: 60 mL via INTRAVENOUS

## 2022-05-27 MED ORDER — LABETALOL HCL 5 MG/ML IV SOLN
20.0000 mg | INTRAVENOUS | Status: DC | PRN
  Administered 2022-05-29: 20 mg via INTRAVENOUS
  Filled 2022-05-27: qty 4

## 2022-05-27 MED ORDER — CLOPIDOGREL BISULFATE 75 MG PO TABS
75.0000 mg | ORAL_TABLET | Freq: Every day | ORAL | Status: DC
  Administered 2022-05-27 – 2022-06-15 (×17): 75 mg via ORAL
  Filled 2022-05-27 (×18): qty 1

## 2022-05-27 MED ORDER — ASCORBIC ACID 500 MG PO TABS
500.0000 mg | ORAL_TABLET | Freq: Every day | ORAL | Status: DC
  Administered 2022-05-27 – 2022-06-15 (×15): 500 mg via ORAL
  Filled 2022-05-27 (×17): qty 1

## 2022-05-27 MED ORDER — OXYCODONE-ACETAMINOPHEN 5-325 MG PO TABS
1.0000 | ORAL_TABLET | Freq: Four times a day (QID) | ORAL | Status: DC | PRN
  Administered 2022-05-27 – 2022-06-01 (×15): 1 via ORAL
  Filled 2022-05-27 (×16): qty 1

## 2022-05-27 MED ORDER — HYDRALAZINE HCL 50 MG PO TABS
100.0000 mg | ORAL_TABLET | Freq: Three times a day (TID) | ORAL | Status: DC
Start: 2022-05-27 — End: 2022-06-01
  Administered 2022-05-27 – 2022-06-01 (×17): 100 mg via ORAL
  Filled 2022-05-27 (×17): qty 2

## 2022-05-27 MED ORDER — CLONIDINE HCL 0.1 MG PO TABS
0.1000 mg | ORAL_TABLET | Freq: Every day | ORAL | Status: DC
  Administered 2022-05-27 – 2022-06-02 (×8): 0.1 mg via ORAL
  Filled 2022-05-27 (×8): qty 1

## 2022-05-27 MED ORDER — GUAIFENESIN-DM 100-10 MG/5ML PO SYRP
10.0000 mL | ORAL_SOLUTION | ORAL | Status: DC | PRN
Start: 2022-05-27 — End: 2022-06-16
  Administered 2022-05-28 – 2022-06-04 (×7): 10 mL via ORAL
  Administered 2022-06-04: 5 mL via ORAL
  Administered 2022-06-09 – 2022-06-14 (×9): 10 mL via ORAL
  Filled 2022-05-27 (×19): qty 10

## 2022-05-27 MED ORDER — LIP MEDEX EX OINT: TOPICAL_OINTMENT | CUTANEOUS | Status: DC | PRN

## 2022-05-27 MED ORDER — CHLORTHALIDONE 25 MG PO TABS
25.0000 mg | ORAL_TABLET | Freq: Every day | ORAL | Status: DC
  Administered 2022-05-27 – 2022-05-29 (×3): 25 mg via ORAL
  Filled 2022-05-27 (×3): qty 1

## 2022-05-27 MED ORDER — ZINC SULFATE 220 (50 ZN) MG PO CAPS
220.0000 mg | ORAL_CAPSULE | Freq: Every day | ORAL | Status: DC
  Administered 2022-05-27 – 2022-06-14 (×14): 220 mg via ORAL
  Filled 2022-05-27 (×16): qty 1

## 2022-05-27 MED ORDER — EZETIMIBE 10 MG PO TABS
10.0000 mg | ORAL_TABLET | Freq: Every day | ORAL | Status: DC
  Administered 2022-05-27 – 2022-06-01 (×6): 10 mg via ORAL
  Filled 2022-05-27 (×6): qty 1

## 2022-05-27 MED ORDER — ALBUTEROL SULFATE HFA 108 (90 BASE) MCG/ACT IN AERS: 2.0000 | INHALATION_SPRAY | RESPIRATORY_TRACT | Status: DC | PRN

## 2022-05-27 MED ORDER — IBUPROFEN 600 MG PO TABS: 600.0000 mg | ORAL_TABLET | Freq: Four times a day (QID) | ORAL | Status: DC | PRN

## 2022-05-27 MED ORDER — ACETAMINOPHEN 650 MG RE SUPP: 650.0000 mg | Freq: Four times a day (QID) | RECTAL | Status: DC | PRN

## 2022-05-27 MED ORDER — POTASSIUM CHLORIDE CRYS ER 20 MEQ PO TBCR
40.0000 meq | EXTENDED_RELEASE_TABLET | Freq: Once | ORAL | Status: AC
  Administered 2022-05-27: 40 meq via ORAL
  Filled 2022-05-27: qty 2

## 2022-05-27 MED ORDER — ASPIRIN 81 MG PO TBEC
81.0000 mg | DELAYED_RELEASE_TABLET | Freq: Every day | ORAL | Status: DC
  Administered 2022-05-27 – 2022-06-15 (×18): 81 mg via ORAL
  Filled 2022-05-27 (×20): qty 1

## 2022-05-27 MED ORDER — AMLODIPINE BESYLATE 10 MG PO TABS
10.0000 mg | ORAL_TABLET | Freq: Every day | ORAL | Status: DC
  Administered 2022-05-27 – 2022-06-15 (×19): 10 mg via ORAL
  Filled 2022-05-27 (×20): qty 1

## 2022-05-27 MED ORDER — TOPIRAMATE 25 MG PO TABS
25.0000 mg | ORAL_TABLET | Freq: Two times a day (BID) | ORAL | Status: DC
  Administered 2022-05-27 – 2022-05-29 (×6): 25 mg via ORAL
  Filled 2022-05-27 (×7): qty 1

## 2022-05-27 MED ORDER — ATORVASTATIN CALCIUM 40 MG PO TABS
40.0000 mg | ORAL_TABLET | Freq: Every day | ORAL | Status: DC
Start: 2022-05-27 — End: 2022-05-27

## 2022-05-27 MED ORDER — FAMOTIDINE 20 MG PO TABS
10.0000 mg | ORAL_TABLET | Freq: Every day | ORAL | Status: DC
  Administered 2022-05-27 – 2022-06-15 (×19): 10 mg via ORAL
  Filled 2022-05-27 (×20): qty 1

## 2022-05-27 MED ORDER — POLYETHYLENE GLYCOL 3350 17 G PO PACK: 17.0000 g | PACK | Freq: Every day | ORAL | Status: DC | PRN

## 2022-05-27 MED ORDER — LACTATED RINGERS IV SOLN: INTRAVENOUS | Status: DC

## 2022-05-27 MED ORDER — ATORVASTATIN CALCIUM 40 MG PO TABS
40.0000 mg | ORAL_TABLET | Freq: Every day | ORAL | Status: DC
Start: 2022-06-02 — End: 2022-06-16
  Administered 2022-06-03 – 2022-06-15 (×9): 40 mg via ORAL
  Filled 2022-05-27 (×12): qty 1

## 2022-05-27 MED ORDER — VITAMIN D 25 MCG (1000 UNIT) PO TABS
2000.0000 [IU] | ORAL_TABLET | Freq: Every day | ORAL | Status: DC
  Administered 2022-05-27 – 2022-06-15 (×15): 2000 [IU] via ORAL
  Filled 2022-05-27 (×17): qty 2

## 2022-05-27 MED ORDER — ENOXAPARIN SODIUM 30 MG/0.3ML IJ SOSY
30.0000 mg | PREFILLED_SYRINGE | INTRAMUSCULAR | Status: DC
  Administered 2022-05-27 – 2022-05-31 (×5): 30 mg via SUBCUTANEOUS
  Filled 2022-05-27 (×6): qty 0.3

## 2022-05-27 NOTE — Progress Notes (Signed)
New Admission Note:  Arrival Method: Transferred via [place] from Rehab floor Mental Orientation: Alert and oriented x 4, forgetful Telemetry: Box 4, CCMD notified Assessment: Completed Skin: Completed, refer to flowsheets IV: No IV on arrival Pain: 6/10 headache Tubes: None Safety Measures: Safety Fall Prevention Plan was given, discussed and signed. Admission: Completed [facility] Orientation: Patient has been orientated to the room, unit and the staff. Family: None  Admitting was notified so orders can be placed and pt seen by MD. Will continue to monitor the patient. Call light has been placed within reach and bed alarm has been activated.   Fara Olden, RN  Phone Number: [phone]

## 2022-05-27 NOTE — Progress Notes (Signed)
Inpatient Rehab Admissions Coordinator Note:   Per was screened for CIR candidacy by Dilan Novosad Danford Bad, CCC-SLP. Note pt COVID + 05/26/22. Patients are eligible to be considered for admit to CIR when cleared from airborne precautions by acute MD or Infectious disease and recovery/improvement in symptoms. Will follow from a distance.   Gayland Curry, MS, CCC-SLP Admissions Coordinator [phone] 05/27/22 12:16 PM

## 2022-05-27 NOTE — Assessment & Plan Note (Signed)
   Continue Plavix, aspirin, statin therapy  We will resume PT while hospitalized here  Patient will need to be reassessed as to whether or not the need to go back to inpatient rehab once COVID-19 begins to improve

## 2022-05-27 NOTE — Assessment & Plan Note (Signed)
?   Patient is currently chest pain free ?? Monitoring patient on telemetry ?? Continue home regimen of antiplatelet therapy, lipid lowering therapy  ? ?

## 2022-05-27 NOTE — Assessment & Plan Note (Signed)
.   Continuing home regimen of lipid lowering therapy.  

## 2022-05-27 NOTE — Assessment & Plan Note (Addendum)
--  Paxlovidstopped 7/18 due to rise in Cr --Remains stable on room air --Supportive treatments as ordered, encourage IS, encourage prone positioning, encourage mobilization  --CTA chest negative for PE

## 2022-05-27 NOTE — Evaluation (Signed)
Physical Therapy Evaluation Patient Details Name: Robin Arellano MRN: [id number] DOB: 1951/08/28 Today's Date: 05/27/2022  History of Present Illness  71 year old female with recent diagnosis of left PCA stroke with eventual discharge inpatient rehab on 7/11 presented back to the medical service due to new onset flulike symptoms, fever of 102.5 F and COVID PCR positivity; with past medical history of fibromyalgia, chronic low back pain status post L4-L5 posterior lumbar fusion 6/15, coronary artery disease (S/P cath 11/2018 mild nonobstructive CAD), hyperlipidemia, chronic kidney disease stage IIIb (baseline Cr 1.6-1.7), 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 Lives at home with family, in a single-level home with a few steps to enter; Prior to original admission, pt was able to manage well, using a RW for amb and has daughter assist for ADLs; Presents to PT tdoay with generalized weakness, fatigue, malaise, gait assymetries, and functional dependencies;  Pt currently with functional limitations due to the deficits listed below (see PT Problem List). Pt will benefit from skilled PT to increase their independence and safety with mobility to allow discharge to the venue listed below.   Also recommend return to AIR for post-acute rehab to maximize independence and safety with mobility and ADLs in prep for getting back home safely      Recommendations for follow up therapy are one component of a multi-disciplinary discharge planning process, led by the attending physician.  Recommendations may be updated based on patient status, additional functional criteria and insurance authorization.  Follow Up Recommendations Acute inpatient rehab (3hours/day)      Assistance Recommended at Discharge Frequent or constant Supervision/Assistance  Patient can return home with the following  A little help with  walking and/or transfers;Assistance with cooking/housework;Direct supervision/assist for medications management;Direct supervision/assist for financial management;Assist for transportation;Help with stairs or ramp for entrance;A lot of help with bathing/dressing/bathroom    Equipment Recommendations Rolling walker (2 wheels);BSC/3in1  Recommendations for Other Services       Functional Status Assessment Patient has had a recent decline in their functional status and demonstrates the ability to make significant improvements in function in a reasonable and predictable amount of time.     Precautions / Restrictions Precautions Precautions: Back;Fall (recent spinal L4-L5 sx on 04/26/22) Precaution Booklet Issued: No Required Braces or Orthoses: Spinal Brace Spinal Brace: Applied in sitting position;Lumbar corset;Other (comment) Restrictions Weight Bearing Restrictions: No      Mobility  Bed Mobility Overal bed mobility: Needs Assistance Bed Mobility: Rolling, Sidelying to Sit Rolling: Min guard (to right side) Sidelying to sit: Min guard       General bed mobility comments: Good push up from R sidelying to sit; HOB slightly elevated    Transfers Overall transfer level: Needs assistance Equipment used: 1 person hand held assist, Rolling walker (2 wheels) Transfers: Sit to/from Stand Sit to Stand: Min assist           General transfer comment: Min assist to steady standing from bed with handheld assist and standing from [facility] to RW    Ambulation/Gait Ambulation/Gait assistance: Min assist, Mod assist Gait Distance (Feet): 30 Feet (around bed to [facility], then BSC to sink, then sink to recliner on window side of bed) Assistive device: 1 person hand held assist, Rolling walker (2 wheels) Gait Pattern/deviations: Decreased step length - right, Decreased step length - left, Shuffle (L with shorter step length than R)       General Gait Details:  Very short, shuffling steps, with  decr lateral weight shift fully into stance R and LLEs; almost festinating gait; cues to scan room for targets (sink, recliner); Occasoinal Mod assist for balance  Stairs            Wheelchair Mobility    Modified Rankin (Stroke Patients Only) Modified Rankin (Stroke Patients Only) Pre-Morbid Rankin Score: Moderate disability Modified Rankin: Moderately severe disability     Balance Overall balance assessment: Needs assistance   Sitting balance-Leahy Scale: Fair Sitting balance - Comments: close supervision   Standing balance support: Single extremity supported Standing balance-Leahy Scale: Poor (approaching Fair)                               Pertinent Vitals/Pain Pain Assessment Pain Assessment: Faces Faces Pain Scale: Hurts even more Pain Location: Bil Hips as well as general malaise Pain Intervention(s): Monitored during session    Home Living Family/patient expects to be discharged to:: Inpatient rehab Living Arrangements: Spouse/significant other;Children Available Help at Discharge: Family;Available 24 hours/day Type of Home: House Home Access: Stairs to enter Entrance Stairs-Rails: Psychiatric nurse of Steps: 5   Home Layout: One level Home Equipment: BSC/3in1;Shower seat;Grab bars - toilet;Grab bars - tub/shower;Toilet riser;Rolling Walker (2 wheels);Cane - single point      Prior Function Prior Level of Function : Needs assist;History of Falls (last six months)       Physical Assist : Mobility (physical);ADLs (physical) Mobility (physical): Transfers;Gait;Stairs ADLs (physical): Bathing;Dressing;Toileting;IADLs Mobility Comments: uses RW at all times, no longer uses cane due to falls ADLs Comments: pt reports daughter or sister assist with ADLs as needed     Hand Dominance   Dominant Hand: Right    Extremity/Trunk Assessment   Upper Extremity Assessment Upper Extremity Assessment: Generalized weakness (Maliase  and body aches made MMT intolerable)    Lower Extremity Assessment Lower Extremity Assessment: Generalized weakness (Maliase and body aches made MMT intolerable)    Cervical / Trunk Assessment Cervical / Trunk Assessment: Back Surgery (on 6/15)  Communication   Communication: No difficulties  Cognition Arousal/Alertness: Awake/alert Behavior During Therapy: WFL for tasks assessed/performed Overall Cognitive Status: Impaired/Different from baseline Area of Impairment: Awareness, Problem solving, Safety/judgement, Attention, Memory                   Current Attention Level: Sustained, Selective Memory: Decreased short-term memory   Safety/Judgement: Decreased awareness of deficits, Decreased awareness of safety Awareness: Emergent Problem Solving: Requires verbal cues, Requires tactile cues          General Comments General comments (skin integrity, edema, etc.): Voided during trip to [facility]; walked in room desptie feeling fatigued; session conducted on room air and pt with no overt shortness of breath; Aplied small foam dressing to potential pressure area top of L buttock near anal cleft, RN aware;  Got up and in the room without her lumbar brace due to the imminent need for the [facility]; Dr. Maylene Roes notified of mobilizing without brace    Exercises     Assessment/Plan    PT Assessment Patient needs continued PT services  PT Problem List Decreased strength;Decreased activity tolerance;Decreased balance;Decreased mobility;Decreased safety awareness;Decreased knowledge of precautions;Decreased knowledge of use of DME       PT Treatment Interventions DME instruction;Gait training;Stair training;Functional mobility training;Therapeutic activities;Therapeutic exercise;Balance training;Patient/family education;Neuromuscular re-education    PT Goals (Current goals can be found in the Care Plan section)  Acute Rehab PT  Goals Patient Stated Goal: less body aches PT Goal Formulation:  With patient Time For Goal Achievement: 06/03/22 Potential to Achieve Goals: Good    Frequency Min 3X/week     Co-evaluation               AM-PAC PT "6 Clicks" Mobility  Outcome Measure Help needed turning from your back to your side while in a flat bed without using bedrails?: A Little Help needed moving from lying on your back to sitting on the side of a flat bed without using bedrails?: A Little Help needed moving to and from a bed to a chair (including a wheelchair)?: A Little Help needed standing up from a chair using your arms (e.g., wheelchair or bedside chair)?: A Little Help needed to walk in hospital room?: A Lot Help needed climbing 3-5 steps with a railing? : Total 6 Click Score: 15    End of Session Equipment Utilized During Treatment: Other (comment) (Tele box in pocket of pt's pajama bottoms; Notified RNand MD of mobilizing without brace) Activity Tolerance: Patient tolerated treatment well Patient left: in chair;with call bell/phone within reach;with chair alarm set Nurse Communication: Mobility status;Precautions PT Visit Diagnosis: Unsteadiness on feet (R26.81);Muscle weakness (generalized) (M62.81);History of falling (Z91.81);Repeated falls (R29.6);Difficulty in walking, not elsewhere classified (R26.2);Dizziness and giddiness (R42)    Time: 0177-9390 PT Time Calculation (min) (ACUTE ONLY): 22 min   Charges:   PT Evaluation $PT Eval Moderate Complexity: [place], PT  Acute Rehabilitation Services Office [phone]   [name] 05/27/2022, 11:05 AM

## 2022-05-27 NOTE — Assessment & Plan Note (Signed)
.   Resume patients home regimen of oral antihypertensives . Titrate antihypertensive regimen as necessary to achieve adequate BP control . PRN intravenous antihypertensives for excessively elevated blood pressure   

## 2022-05-27 NOTE — Progress Notes (Signed)
PROGRESS NOTE    Robin Arellano  VHQ:[id number] DOB: Jul 17, 1951 DOA: 05/26/2022 PCP: Sandi Mariscal, MD     Brief Narrative:  Robin Arellano is a 71 year old female with past medical history of fibromyalgia, chronic low back pain status post L4-L5 posterior lumbar fusion 6/15, coronary artery disease (S/P cath 11/2018 mild nonobstructive CAD), hyperlipidemia, chronic kidney disease stage IIIb (baseline Cr 1.6-1.7), penetrating atherosclerotic ulcer of the descending aorta (Dx 05/2022), with recent diagnosis of left PCA stroke with eventual discharge inpatient rehab on 7/11 now presenting back to the medical service due to new onset flulike symptoms, fever of 102.5 F and COVID PCR positivity.  New events last 24 hours / Subjective: Patient seen in airborne isolation room.  Continues to have muscle aches, malaise, shortness of breath.  She remained stable on room air currently.  Assessment & Plan:   Principal Problem:   COVID-19 virus infection Active Problems:   Acute left PCA stroke (HCC)   Essential hypertension   Coronary artery disease involving native coronary artery of native heart without angina pectoris   Chronic kidney disease, stage 3b (HCC)   Mixed hyperlipidemia   Pressure injury of skin   COVID-19  -Tested positive on 05/26/22 -Continue paxlovid x 5 days  -Remains stable on room air -Supportive treatments as ordered, encourage IS, encourage prone positioning, encourage mobilization  -Due to elevated d-dimer, ordered CTA chest to rule out PE   COVID-19 Labs  Recent Labs    05/26/22 2254  DDIMER 2.38*  CRP 6.9*    Lab Results  Component Value Date   SARSCOV2NAA POSITIVE (A) 05/26/2022   [place] NEGATIVE 07/19/2020   [place] NEGATIVE 05/09/2020   [place] NEGATIVE 04/01/2020    Left PCA stroke CAD -Plavix, aspirin, Lipitor -PT  Hypertension -Norvasc, Catapres, hydralazine  CKD stage IIIb -Stable  Hypokalemia -Replace   In  agreement with assessment of the pressure ulcer as below:  Pressure Injury 05/26/22 Buttocks Left Stage 2 -  Partial thickness loss of dermis presenting as a shallow open injury with a red, pink wound bed without slough. red, pink (Active)  05/26/22 2045  Location: Buttocks  Location Orientation: Left  Staging: Stage 2 -  Partial thickness loss of dermis presenting as a shallow open injury with a red, pink wound bed without slough.  Wound Description (Comments): red, pink  Present on Admission: Yes  Dressing Type Foam - Lift dressing to assess site every shift 05/27/22 0815         DVT prophylaxis:  enoxaparin (LOVENOX) injection 30 mg Start: 05/27/22 1000  Code Status: Full code Family Communication: No family at bedside Disposition Plan:  Status is: Inpatient Remains inpatient appropriate because: Continue treatment for COVID-19, likely back to CIR once completed with isolation   Antimicrobials:  Anti-infectives (From admission, onward)    Start     Dose/Rate Route Frequency Ordered Stop   05/27/22 0115  nirmatrelvir/ritonavir EUA (renal dosing) (PAXLOVID) 2 tablet        2 tablet Oral 2 times daily 05/27/22 0017 05/31/22 2159        Objective: Vitals:   05/26/22 2256 05/27/22 0032 05/27/22 0134 05/27/22 0815  BP:  (!) 187/77 (!) 186/79 (!) 155/69  Pulse:  93 91 73  Resp:  18  16  Temp:  99.4 F (37.4 C) 99.6 F (37.6 C) (!) 100.8 F (38.2 C)  TempSrc:  Oral Oral Oral  SpO2:  93% 91% 96%  Weight: 64.6 kg     Height:  $'5\' 3"'g$  (1.6 m)       Intake/Output Summary (Last 24 hours) at 05/27/2022 1328 Last data filed at 05/27/2022 0815 Gross per 24 hour  Intake 968.54 ml  Output 400 ml  Net 568.54 ml   Filed Weights   05/26/22 2256  Weight: 64.6 kg    Examination:  General exam: Appears calm and comfortable  Respiratory system: Respiratory effort normal. No respiratory distress. No conversational dyspnea.  On room air Central nervous system: Alert and  oriented. Speech clear.  Extremities: Symmetric in appearance  Psychiatry: Judgement and insight appear normal. Mood & affect appropriate.   Data Reviewed: I have personally reviewed following labs and imaging studies  CBC: Recent Labs  Lab 05/22/22 0358 05/23/22 0724 05/26/22 1255  WBC 8.8 9.0 8.3  NEUTROABS  --  6.3 7.1  HGB 8.8* 10.0* 10.5*  HCT 26.6* 30.6* 30.7*  MCV 96.7 99.4 95.6  PLT 271 316 616   Basic Metabolic Panel: Recent Labs  Lab 05/21/22 0036 05/22/22 0358 05/23/22 0724 05/26/22 1255 05/26/22 2254 05/27/22 0746  NA 133* 134* 137 135  --  134*  K 3.8 3.7 4.5 3.1*  --  3.4*  CL 102 107 105 98  --  101  CO2 20* 21* 24 20*  --  19*  GLUCOSE 103* 94 104* 101*  --  94  BUN 27* '19 16 18  '$ --  16  CREATININE 2.06* 1.72* 1.75* 1.81*  --  1.72*  CALCIUM 9.0 8.8* 9.5 9.4  --  8.9  MG  --   --   --   --  2.2  --    GFR: Estimated Creatinine Clearance: 27.1 mL/min (A) (by C-G formula based on SCr of 1.72 mg/dL (H)). Liver Function Tests: Recent Labs  Lab 05/22/22 0358 05/23/22 0724  AST 22 24  ALT 18 17  ALKPHOS 106 115  BILITOT 0.4 0.3  PROT 6.5 7.3  ALBUMIN 3.1* 3.4*   No results for input(s): "LIPASE", "AMYLASE" in the last 168 hours. No results for input(s): "AMMONIA" in the last 168 hours. Coagulation Profile: No results for input(s): "INR", "PROTIME" in the last 168 hours. Cardiac Enzymes: No results for input(s): "CKTOTAL", "CKMB", "CKMBINDEX", "TROPONINI" in the last 168 hours. BNP (last 3 results) No results for input(s): "PROBNP" in the last 8760 hours. HbA1C: No results for input(s): "HGBA1C" in the last 72 hours. CBG: Recent Labs  Lab 05/26/22 1636 05/27/22 0759 05/27/22 1207  GLUCAP 122* 96 92   Lipid Profile: No results for input(s): "CHOL", "HDL", "LDLCALC", "TRIG", "CHOLHDL", "LDLDIRECT" in the last 72 hours. Thyroid Function Tests: No results for input(s): "TSH", "T4TOTAL", "FREET4", "T3FREE", "THYROIDAB" in the last 72  hours. Anemia Panel: No results for input(s): "VITAMINB12", "FOLATE", "FERRITIN", "TIBC", "IRON", "RETICCTPCT" in the last 72 hours. Sepsis Labs: Recent Labs  Lab 05/26/22 1612 05/26/22 2254  PROCALCITON  --  0.31  LATICACIDVEN 0.6 0.8    Recent Results (from the past 240 hour(s))  Urine Culture     Status: Abnormal   Collection Time: 05/19/22  6:27 PM   Specimen: Urine, Clean Catch  Result Value Ref Range Status   Specimen Description URINE, CLEAN CATCH  Final   Special Requests   Final    NONE Performed at [facility], [place] [address] [place], [place] [address]    Culture 50,000 COLONIES/mL ESCHERICHIA COLI (A)  Final   Report Status 05/21/2022 FINAL  Final   Organism ID, Bacteria ESCHERICHIA COLI (A)  Final      Susceptibility   Escherichia coli - MIC*    AMPICILLIN <=2 SENSITIVE Sensitive     CEFAZOLIN <=4 SENSITIVE Sensitive     CEFEPIME <=0.12 SENSITIVE Sensitive     CEFTRIAXONE <=0.25 SENSITIVE Sensitive     CIPROFLOXACIN <=0.25 SENSITIVE Sensitive     GENTAMICIN <=1 SENSITIVE Sensitive     IMIPENEM <=0.25 SENSITIVE Sensitive     NITROFURANTOIN <=16 SENSITIVE Sensitive     TRIMETH/SULFA <=20 SENSITIVE Sensitive     AMPICILLIN/SULBACTAM <=2 SENSITIVE Sensitive     PIP/TAZO <=4 SENSITIVE Sensitive     * 50,000 COLONIES/mL ESCHERICHIA COLI  Resp Panel by RT-PCR (Flu A&B, Covid)     Status: Abnormal   Collection Time: 05/26/22  2:30 PM  Result Value Ref Range Status   SARS Coronavirus 2 by RT PCR POSITIVE (A) NEGATIVE Final    Comment: (NOTE) SARS-CoV-2 target nucleic acids are DETECTED.  The SARS-CoV-2 RNA is generally detectable in upper respiratory specimens during the acute phase of infection. Positive results are indicative of the presence of the identified virus, but do not rule out bacterial infection or co-infection with other pathogens not detected by the test. Clinical correlation with patient history and other diagnostic information is necessary  to determine patient infection status. The expected result is Negative.  Fact Sheet for Patients: EntrepreneurPulse.com.au  Fact Sheet for Healthcare Providers: IncredibleEmployment.be  This test is not yet approved or cleared by the [place] FDA and  has been authorized for detection and/or diagnosis of SARS-CoV-2 by FDA under an Emergency Use Authorization (EUA).  This EUA will remain in effect (meaning this test can be used) for the duration of  the COVID-19 declaration under Section 564(b)(1) of the A ct, 21 U.S.C. section 360bbb-3(b)(1), unless the authorization is terminated or revoked sooner.     Influenza A by PCR NEGATIVE NEGATIVE Final   Influenza B by PCR NEGATIVE NEGATIVE Final    Comment: (NOTE) The Xpert Xpress SARS-CoV-2/FLU/RSV plus assay is intended as an aid in the diagnosis of influenza from Nasopharyngeal swab specimens and should not be used as a sole basis for treatment. Nasal washings and aspirates are unacceptable for Xpert Xpress SARS-CoV-2/FLU/RSV testing.  Fact Sheet for Patients: EntrepreneurPulse.com.au  Fact Sheet for Healthcare Providers: IncredibleEmployment.be  This test is not yet approved or cleared by the [place] FDA and has been authorized for detection and/or diagnosis of SARS-CoV-2 by FDA under an Emergency Use Authorization (EUA). This EUA will remain in effect (meaning this test can be used) for the duration of the COVID-19 declaration under Section 564(b)(1) of the Act, 21 U.S.C. section 360bbb-3(b)(1), unless the authorization is terminated or revoked.  Performed at [facility], [place] [address] [place], [place] [address]   Respiratory (~20 pathogens) panel by PCR     Status: None   Collection Time: 05/26/22  2:30 PM  Result Value Ref Range Status   Adenovirus NOT DETECTED NOT DETECTED Final   Coronavirus 229E NOT DETECTED NOT DETECTED Final     Comment: (NOTE) The Coronavirus on the Respiratory Panel, DOES NOT test for the novel  Coronavirus (2019 nCoV)    Coronavirus HKU1 NOT DETECTED NOT DETECTED Final   Coronavirus NL63 NOT DETECTED NOT DETECTED Final   Coronavirus OC43 NOT DETECTED NOT DETECTED Final   Metapneumovirus NOT DETECTED NOT DETECTED Final   Rhinovirus / Enterovirus NOT DETECTED NOT DETECTED Final   Influenza A NOT DETECTED NOT DETECTED Final   Influenza  B NOT DETECTED NOT DETECTED Final   Parainfluenza Virus 1 NOT DETECTED NOT DETECTED Final   Parainfluenza Virus 2 NOT DETECTED NOT DETECTED Final   Parainfluenza Virus 3 NOT DETECTED NOT DETECTED Final   Parainfluenza Virus 4 NOT DETECTED NOT DETECTED Final   Respiratory Syncytial Virus NOT DETECTED NOT DETECTED Final   Bordetella pertussis NOT DETECTED NOT DETECTED Final   Bordetella Parapertussis NOT DETECTED NOT DETECTED Final   Chlamydophila pneumoniae NOT DETECTED NOT DETECTED Final   Mycoplasma pneumoniae NOT DETECTED NOT DETECTED Final    Comment: Performed at [facility], [place] [address], [place], [place] [address]  Culture, blood (Routine X 2) w Reflex to ID Panel     Status: None (Preliminary result)   Collection Time: 05/26/22  4:16 PM   Specimen: BLOOD  Result Value Ref Range Status   Specimen Description BLOOD RIGHT ANTECUBITAL  Final   Special Requests   Final    BOTTLES DRAWN AEROBIC AND ANAEROBIC Blood Culture adequate volume   Culture   Final    NO GROWTH < 24 HOURS Performed at [facility], [place] [address] [place], [place] [address]    Report Status PENDING  Incomplete  Culture, blood (Routine X 2) w Reflex to ID Panel     Status: None (Preliminary result)   Collection Time: 05/26/22  4:16 PM   Specimen: BLOOD RIGHT FOREARM  Result Value Ref Range Status   Specimen Description BLOOD RIGHT FOREARM  Final   Special Requests   Final    BOTTLES DRAWN AEROBIC AND ANAEROBIC Blood Culture adequate volume   Culture   Final     NO GROWTH < 24 HOURS Performed at [facility], [place] [address], [place], [place] [address]    Report Status PENDING  Incomplete      Radiology Studies: DG CHEST PORT 1 VIEW  Result Date: 05/26/2022 CLINICAL DATA:  Cough, CVA EXAM: PORTABLE CHEST 1 VIEW COMPARISON:  05/16/2022 chest radiograph. FINDINGS: Partially visualized surgical hardware from ACDF. Stable cardiomediastinal silhouette with normal heart size. No pneumothorax. No pleural effusion. Lungs appear clear, with no acute consolidative airspace disease and no pulmonary edema. IMPRESSION: No active disease. Electronically Signed   By: Ilona Sorrel M.D.   On: 05/26/2022 15:22      Scheduled Meds:  amLODipine  10 mg Oral Daily   vitamin C  500 mg Oral Daily   aspirin EC  81 mg Oral Daily   [START ON 06/02/2022] atorvastatin  40 mg Oral Daily   cholecalciferol  2,000 Units Oral Daily   cloNIDine  0.1 mg Oral QHS   clopidogrel  75 mg Oral Daily   enoxaparin (LOVENOX) injection  30 mg Subcutaneous Q24H   ezetimibe  10 mg Oral Daily   famotidine  10 mg Oral Daily   hydrALAZINE  100 mg Oral TID   nirmatrelvir/ritonavir EUA (renal dosing)  2 tablet Oral BID   topiramate  25 mg Oral BID   zinc sulfate  220 mg Oral Daily   Continuous Infusions:   LOS: 1 day     Dessa Phi, DO [facility] 05/27/2022, 1:28 PM   Available via Epic secure chat 7am-7pm After these hours, please refer to coverage provider listed on [email or web address]

## 2022-05-27 NOTE — Assessment & Plan Note (Signed)
Strict intake and output monitoring Creatinine near baseline Minimizing nephrotoxic agents as much as possible Serial chemistries to monitor renal function and electrolytes  

## 2022-05-27 NOTE — Plan of Care (Signed)
FUO, COVID +, discharge to acute and unable to complete rehab

## 2022-05-28 LAB — CBC WITH DIFFERENTIAL/PLATELET
Abs Immature Granulocytes: 0.04 10*3/uL (ref 0.00–0.07)
Basophils Absolute: 0 10*3/uL (ref 0.0–0.1)
Basophils Relative: 0 %
Eosinophils Absolute: 0 10*3/uL (ref 0.0–0.5)
Eosinophils Relative: 0 %
HCT: 31.1 % — ABNORMAL LOW (ref 36.0–46.0)
Hemoglobin: 10.4 g/dL — ABNORMAL LOW (ref 12.0–15.0)
Immature Granulocytes: 0 %
Lymphocytes Relative: 5 %
Lymphs Abs: 0.5 10*3/uL — ABNORMAL LOW (ref 0.7–4.0)
MCH: 31.9 pg (ref 26.0–34.0)
MCHC: 33.4 g/dL (ref 30.0–36.0)
MCV: 95.4 fL (ref 80.0–100.0)
Monocytes Absolute: 1.2 10*3/uL — ABNORMAL HIGH (ref 0.1–1.0)
Monocytes Relative: 14 %
Neutro Abs: 7.2 10*3/uL (ref 1.7–7.7)
Neutrophils Relative %: 81 %
Platelets: 241 10*3/uL (ref 150–400)
RBC: 3.26 MIL/uL — ABNORMAL LOW (ref 3.87–5.11)
RDW: 14 % (ref 11.5–15.5)
WBC: 9 10*3/uL (ref 4.0–10.5)
nRBC: 0 % (ref 0.0–0.2)

## 2022-05-28 LAB — COMPREHENSIVE METABOLIC PANEL
ALT: 45 U/L — ABNORMAL HIGH (ref 0–44)
AST: 88 U/L — ABNORMAL HIGH (ref 15–41)
Albumin: 3.5 g/dL (ref 3.5–5.0)
Alkaline Phosphatase: 119 U/L (ref 38–126)
Anion gap: 14 (ref 5–15)
BUN: 18 mg/dL (ref 8–23)
CO2: 17 mmol/L — ABNORMAL LOW (ref 22–32)
Calcium: 8.9 mg/dL (ref 8.9–10.3)
Chloride: 100 mmol/L (ref 98–111)
Creatinine, Ser: 1.92 mg/dL — ABNORMAL HIGH (ref 0.44–1.00)
GFR, Estimated: 28 mL/min — ABNORMAL LOW (ref >60)
Glucose, Bld: 94 mg/dL (ref 70–99)
Potassium: 3.4 mmol/L — ABNORMAL LOW (ref 3.5–5.1)
Sodium: 131 mmol/L — ABNORMAL LOW (ref 135–145)
Total Bilirubin: 0.6 mg/dL (ref 0.3–1.2)
Total Protein: 7.4 g/dL (ref 6.5–8.1)

## 2022-05-28 LAB — AMMONIA: Ammonia: 24 umol/L (ref 9–35)

## 2022-05-28 LAB — MAGNESIUM: Magnesium: 2 mg/dL (ref 1.7–2.4)

## 2022-05-28 LAB — URINE CULTURE

## 2022-05-28 LAB — C-REACTIVE PROTEIN: CRP: 10.3 mg/dL — ABNORMAL HIGH (ref <1.0)

## 2022-05-28 LAB — D-DIMER, QUANTITATIVE: D-Dimer, Quant: 3.1 ug/mL-FEU — ABNORMAL HIGH (ref 0.00–0.50)

## 2022-05-28 MED ORDER — SODIUM CHLORIDE 0.9 % IV SOLN: INTRAVENOUS | Status: DC

## 2022-05-28 MED ORDER — POTASSIUM CHLORIDE CRYS ER 20 MEQ PO TBCR
40.0000 meq | EXTENDED_RELEASE_TABLET | Freq: Once | ORAL | Status: AC
Start: 2022-05-28 — End: 2022-05-28
  Administered 2022-05-28: 40 meq via ORAL
  Filled 2022-05-28: qty 2

## 2022-05-28 MED ORDER — SODIUM CHLORIDE 0.9 % IV SOLN
1.0000 g | INTRAVENOUS | Status: DC
  Administered 2022-05-28 – 2022-05-30 (×3): 1 g via INTRAVENOUS
  Filled 2022-05-28 (×3): qty 10

## 2022-05-28 NOTE — Plan of Care (Signed)
  Problem: Education: Goal: Knowledge of General Education information will improve Description: Including pain rating scale, medication(s)/side effects and non-pharmacologic comfort measures Outcome: Completed/Met

## 2022-05-28 NOTE — Progress Notes (Signed)
PROGRESS NOTE    Robin Arellano  GMW:[id number] DOB: [birth year] DOA: 05/26/2022 PCP: Sandi Mariscal, MD     Brief Narrative:  Robin Arellano is a 71 year old female with past medical history of fibromyalgia, chronic low back pain status post L4-L5 posterior lumbar fusion 6/15, coronary artery disease (S/P cath 11/2018 mild nonobstructive CAD), hyperlipidemia, chronic kidney disease stage IIIb (baseline Cr 1.6-1.7), penetrating atherosclerotic ulcer of the descending aorta (Dx 05/2022), with recent diagnosis of left PCA stroke with eventual discharge inpatient rehab on 7/11 now presenting back to the medical service due to new onset flulike symptoms, fever of 102.5 F and COVID PCR positivity.  New events last 24 hours / Subjective: Patient seen in airborne isolation room.  States that she is not feeling very well today.  She's a bit confused this morning. Asks me "what's going on with me?" And is surprised to hear that she has covid. She is alert and oriented to self and year only. She is able to answer questions and follow commands. When asked who she lives with, if she is married, if she has children, she asks "I dont know, am I married? Do I have children?"  I called and spoke with husband over the phone.  He is at home with COVID as well.  He states that patient has had some on and off confusion in the past 2 weeks since her stroke.  She has had some episodes of hallucinations during her last hospital stay.  Assessment & Plan:   Principal Problem:   COVID-19 virus infection Active Problems:   Acute left PCA stroke (HCC)   Essential hypertension   Coronary artery disease involving native coronary artery of native heart without angina pectoris   Chronic kidney disease, stage 3b (HCC)   Mixed hyperlipidemia   Pressure injury of skin   COVID-19  -Tested positive on 05/26/22 -Continue paxlovid x 5 days  -Remains stable on room air -Supportive treatments as ordered, encourage IS,  encourage prone positioning, encourage mobilization  -CTA chest negative for PE -IV fluids for decreased oral intake  COVID-19 Labs  Recent Labs    05/26/22 2254 05/28/22 0256  DDIMER 2.38* 3.10*  CRP 6.9* 10.3*     Lab Results  Component Value Date   SARSCOV2NAA POSITIVE (A) 05/26/2022   [place] NEGATIVE 07/19/2020   [place] NEGATIVE 05/09/2020   [place] NEGATIVE 44/01/4741    Acute metabolic encephalopathy -Seems to have waxed and waned in the last 2 weeks since her stroke, per husband's history -Could also be in setting of hospital delirium, COVID, UTI -Ammonia level normal  UTI -UA with trace leukocytes, positive nitrite, many bacteria -Urine culture shows multiple species -Rocephin x5 days  Left PCA stroke CAD -Plavix, aspirin, Lipitor -PT  Hypertension -Norvasc, Catapres, hydralazine, chlorthalidone  CKD stage IIIb -Baseline creatinine 1.9 -Stable  Hypokalemia -Replace     In agreement with assessment of the pressure ulcer as below:  Pressure Injury 05/26/22 Buttocks Left Stage 2 -  Partial thickness loss of dermis presenting as a shallow open injury with a red, pink wound bed without slough. red, pink (Active)  05/26/22 2045  Location: Buttocks  Location Orientation: Left  Staging: Stage 2 -  Partial thickness loss of dermis presenting as a shallow open injury with a red, pink wound bed without slough.  Wound Description (Comments): red, pink  Present on Admission: Yes  Dressing Type Foam - Lift dressing to assess site every shift 05/28/22 0800  DVT prophylaxis:  enoxaparin (LOVENOX) injection 30 mg Start: 05/27/22 1000  Code Status: Full code Family Communication: No family at bedside, spoke with husband over phone  Disposition Plan:  Status is: Inpatient Remains inpatient appropriate because: Continue treatment for COVID-19, back to CIR once completed with isolation (7/26) vs discharge to SNF/home     Antimicrobials:  Anti-infectives (From admission, onward)    Start     Dose/Rate Route Frequency Ordered Stop   05/28/22 1300  cefTRIAXone (ROCEPHIN) 1 g in sodium chloride 0.9 % 100 mL IVPB        1 g 200 mL/hr over 30 Minutes Intravenous Every 24 hours 05/28/22 1209 06/02/22 1259   05/27/22 0115  nirmatrelvir/ritonavir EUA (renal dosing) (PAXLOVID) 2 tablet        2 tablet Oral 2 times daily 05/27/22 0017 05/31/22 2159        Objective: Vitals:   05/27/22 0815 05/27/22 1600 05/27/22 2131 05/28/22 0439  BP: (!) 155/69 (!) 180/84  (!) 170/90  Pulse: 73   90  Resp: '16 16  20  '$ Temp: (!) 100.8 F (38.2 C) 99.4 F (37.4 C) 99.3 F (37.4 C) (!) 100.7 F (38.2 C)  TempSrc: Oral Oral Oral Oral  SpO2: 96% 96%  97%  Weight:      Height:        Intake/Output Summary (Last 24 hours) at 05/28/2022 1259 Last data filed at 05/28/2022 0600 Gross per 24 hour  Intake 240 ml  Output 0 ml  Net 240 ml    Filed Weights   05/26/22 2256  Weight: 64.6 kg    Examination:  General exam: Appears calm and comfortable  Respiratory system: CTAB.  Respiratory effort normal. No respiratory distress. No conversational dyspnea.  On room air Central nervous system: Alert and oriented x person, year. Speech clear. No focal deficits, moves all extremities, CN 2-12 grossly in tact  Extremities: Symmetric in appearance  Psychiatry: Judgement and insight appear poor   Data Reviewed: I have personally reviewed following labs and imaging studies  CBC: Recent Labs  Lab 05/22/22 0358 05/23/22 0724 05/26/22 1255 05/28/22 0256  WBC 8.8 9.0 8.3 9.0  NEUTROABS  --  6.3 7.1 7.2  HGB 8.8* 10.0* 10.5* 10.4*  HCT 26.6* 30.6* 30.7* 31.1*  MCV 96.7 99.4 95.6 95.4  PLT 271 316 254 269    Basic Metabolic Panel: Recent Labs  Lab 05/22/22 0358 05/23/22 0724 05/26/22 1255 05/26/22 2254 05/27/22 0746 05/28/22 0256  NA 134* 137 135  --  134* 131*  K 3.7 4.5 3.1*  --  3.4* 3.4*  CL 107 105 98   --  101 100  CO2 21* 24 20*  --  19* 17*  GLUCOSE 94 104* 101*  --  94 94  BUN '19 16 18  '$ --  16 18  CREATININE 1.72* 1.75* 1.81*  --  1.72* 1.92*  CALCIUM 8.8* 9.5 9.4  --  8.9 8.9  MG  --   --   --  2.2  --  2.0    GFR: Estimated Creatinine Clearance: 24.3 mL/min (A) (by C-G formula based on SCr of 1.92 mg/dL (H)). Liver Function Tests: Recent Labs  Lab 05/22/22 0358 05/23/22 0724 05/28/22 0256  AST 22 24 88*  ALT 18 17 45*  ALKPHOS 106 115 119  BILITOT 0.4 0.3 0.6  PROT 6.5 7.3 7.4  ALBUMIN 3.1* 3.4* 3.5    No results for input(s): "LIPASE", "AMYLASE" in the last 168 hours. Recent  Labs  Lab 05/28/22 0954  AMMONIA 24   Coagulation Profile: No results for input(s): "INR", "PROTIME" in the last 168 hours. Cardiac Enzymes: No results for input(s): "CKTOTAL", "CKMB", "CKMBINDEX", "TROPONINI" in the last 168 hours. BNP (last 3 results) No results for input(s): "PROBNP" in the last 8760 hours. HbA1C: No results for input(s): "HGBA1C" in the last 72 hours. CBG: Recent Labs  Lab 05/26/22 1636 05/27/22 0759 05/27/22 1207  GLUCAP 122* 96 92    Lipid Profile: No results for input(s): "CHOL", "HDL", "LDLCALC", "TRIG", "CHOLHDL", "LDLDIRECT" in the last 72 hours. Thyroid Function Tests: No results for input(s): "TSH", "T4TOTAL", "FREET4", "T3FREE", "THYROIDAB" in the last 72 hours. Anemia Panel: No results for input(s): "VITAMINB12", "FOLATE", "FERRITIN", "TIBC", "IRON", "RETICCTPCT" in the last 72 hours. Sepsis Labs: Recent Labs  Lab 05/26/22 1612 05/26/22 2254  PROCALCITON  --  0.31  LATICACIDVEN 0.6 0.8     Recent Results (from the past 240 hour(s))  Urine Culture     Status: Abnormal   Collection Time: 05/19/22  6:27 PM   Specimen: Urine, Clean Catch  Result Value Ref Range Status   Specimen Description URINE, CLEAN CATCH  Final   Special Requests   Final    NONE Performed at [facility], [place] [address] [place], [place] [address]    Culture  50,000 COLONIES/mL ESCHERICHIA COLI (A)  Final   Report Status 05/21/2022 FINAL  Final   Organism ID, Bacteria ESCHERICHIA COLI (A)  Final      Susceptibility   Escherichia coli - MIC*    AMPICILLIN <=2 SENSITIVE Sensitive     CEFAZOLIN <=4 SENSITIVE Sensitive     CEFEPIME <=0.12 SENSITIVE Sensitive     CEFTRIAXONE <=0.25 SENSITIVE Sensitive     CIPROFLOXACIN <=0.25 SENSITIVE Sensitive     GENTAMICIN <=1 SENSITIVE Sensitive     IMIPENEM <=0.25 SENSITIVE Sensitive     NITROFURANTOIN <=16 SENSITIVE Sensitive     TRIMETH/SULFA <=20 SENSITIVE Sensitive     AMPICILLIN/SULBACTAM <=2 SENSITIVE Sensitive     PIP/TAZO <=4 SENSITIVE Sensitive     * 50,000 COLONIES/mL ESCHERICHIA COLI  Resp Panel by RT-PCR (Flu A&B, Covid)     Status: Abnormal   Collection Time: 05/26/22  2:30 PM  Result Value Ref Range Status   SARS Coronavirus 2 by RT PCR POSITIVE (A) NEGATIVE Final    Comment: (NOTE) SARS-CoV-2 target nucleic acids are DETECTED.  The SARS-CoV-2 RNA is generally detectable in upper respiratory specimens during the acute phase of infection. Positive results are indicative of the presence of the identified virus, but do not rule out bacterial infection or co-infection with other pathogens not detected by the test. Clinical correlation with patient history and other diagnostic information is necessary to determine patient infection status. The expected result is Negative.  Fact Sheet for Patients: EntrepreneurPulse.com.au  Fact Sheet for Healthcare Providers: IncredibleEmployment.be  This test is not yet approved or cleared by the [place] FDA and  has been authorized for detection and/or diagnosis of SARS-CoV-2 by FDA under an Emergency Use Authorization (EUA).  This EUA will remain in effect (meaning this test can be used) for the duration of  the COVID-19 declaration under Section 564(b)(1) of the A ct, 21 U.S.C. section 360bbb-3(b)(1), unless  the authorization is terminated or revoked sooner.     Influenza A by PCR NEGATIVE NEGATIVE Final   Influenza B by PCR NEGATIVE NEGATIVE Final    Comment: (NOTE) The Xpert Xpress SARS-CoV-2/FLU/RSV plus assay is  intended as an aid in the diagnosis of influenza from Nasopharyngeal swab specimens and should not be used as a sole basis for treatment. Nasal washings and aspirates are unacceptable for Xpert Xpress SARS-CoV-2/FLU/RSV testing.  Fact Sheet for Patients: EntrepreneurPulse.com.au  Fact Sheet for Healthcare Providers: IncredibleEmployment.be  This test is not yet approved or cleared by the [place] FDA and has been authorized for detection and/or diagnosis of SARS-CoV-2 by FDA under an Emergency Use Authorization (EUA). This EUA will remain in effect (meaning this test can be used) for the duration of the COVID-19 declaration under Section 564(b)(1) of the Act, 21 U.S.C. section 360bbb-3(b)(1), unless the authorization is terminated or revoked.  Performed at [facility], [place] [address] [place], [place] [address]   Respiratory (~20 pathogens) panel by PCR     Status: None   Collection Time: 05/26/22  2:30 PM  Result Value Ref Range Status   Adenovirus NOT DETECTED NOT DETECTED Final   Coronavirus 229E NOT DETECTED NOT DETECTED Final    Comment: (NOTE) The Coronavirus on the Respiratory Panel, DOES NOT test for the novel  Coronavirus (2019 nCoV)    Coronavirus HKU1 NOT DETECTED NOT DETECTED Final   Coronavirus NL63 NOT DETECTED NOT DETECTED Final   Coronavirus OC43 NOT DETECTED NOT DETECTED Final   Metapneumovirus NOT DETECTED NOT DETECTED Final   Rhinovirus / Enterovirus NOT DETECTED NOT DETECTED Final   Influenza A NOT DETECTED NOT DETECTED Final   Influenza B NOT DETECTED NOT DETECTED Final   Parainfluenza Virus 1 NOT DETECTED NOT DETECTED Final   Parainfluenza Virus 2 NOT DETECTED NOT DETECTED Final    Parainfluenza Virus 3 NOT DETECTED NOT DETECTED Final   Parainfluenza Virus 4 NOT DETECTED NOT DETECTED Final   Respiratory Syncytial Virus NOT DETECTED NOT DETECTED Final   Bordetella pertussis NOT DETECTED NOT DETECTED Final   Bordetella Parapertussis NOT DETECTED NOT DETECTED Final   Chlamydophila pneumoniae NOT DETECTED NOT DETECTED Final   Mycoplasma pneumoniae NOT DETECTED NOT DETECTED Final    Comment: Performed at [facility], [place] [address], [place], [place] [address]  Culture, blood (Routine X 2) w Reflex to ID Panel     Status: None (Preliminary result)   Collection Time: 05/26/22  4:16 PM   Specimen: BLOOD  Result Value Ref Range Status   Specimen Description BLOOD RIGHT ANTECUBITAL  Final   Special Requests   Final    BOTTLES DRAWN AEROBIC AND ANAEROBIC Blood Culture adequate volume   Culture   Final    NO GROWTH 2 DAYS Performed at [facility], [place] [address], [place], [place] [address]    Report Status PENDING  Incomplete  Culture, blood (Routine X 2) w Reflex to ID Panel     Status: None (Preliminary result)   Collection Time: 05/26/22  4:16 PM   Specimen: BLOOD RIGHT FOREARM  Result Value Ref Range Status   Specimen Description BLOOD RIGHT FOREARM  Final   Special Requests   Final    BOTTLES DRAWN AEROBIC AND ANAEROBIC Blood Culture adequate volume   Culture   Final    NO GROWTH 2 DAYS Performed at [facility], [place] [address] [place], [place] [address]    Report Status PENDING  Incomplete  Urine Culture     Status: Abnormal   Collection Time: 05/27/22  8:00 AM   Specimen: Urine, Clean Catch  Result Value Ref Range Status   Specimen Description URINE, CLEAN CATCH  Final   Special Requests   Final    NONE Performed at [facility], [place] [address], [place], [place] [address]    Culture MULTIPLE SPECIES PRESENT, SUGGEST RECOLLECTION (A)  Final   Report Status 05/28/2022 FINAL  Final      Radiology Studies: CT Angio Chest  Pulmonary Embolism (PE) W or WO Contrast  Result Date: 05/27/2022 CLINICAL DATA:  Cough. Elevated D-dimer. COVID infection. Clinical suspicion for pulmonary embolism. EXAM: CT ANGIOGRAPHY CHEST WITH CONTRAST TECHNIQUE: Multidetector CT imaging of the chest was performed using the standard protocol during bolus administration of intravenous contrast. Multiplanar CT image reconstructions and MIPs were obtained to evaluate the vascular anatomy. RADIATION DOSE REDUCTION: This exam was performed according to the departmental dose-optimization program which includes automated exposure control, adjustment of the mA and/or kV according to patient size and/or use of iterative reconstruction technique. CONTRAST:  63m OMNIPAQUE IOHEXOL 350 MG/ML SOLN COMPARISON:  05/14/2022 FINDINGS: Cardiovascular: Satisfactory opacification of pulmonary arteries noted, and no pulmonary emboli identified. Stable small pseudoaneurysm or ductus diverticulum arising from the lateral aspect of the aortic arch, measuring 1.8 cm in diameter. Abundant atherosclerotic plaque seen throughout the aorta. Coronary atherosclerotic calcification also noted. Mediastinum/Nodes: No masses or pathologically enlarged lymph nodes identified. Lungs/Pleura: No pulmonary mass, infiltrate, or effusion. Upper abdomen: No acute findings. Musculoskeletal: No suspicious bone lesions identified. Review of the MIP images confirms the above findings. IMPRESSION: No evidence of pulmonary embolism or other acute findings. 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 Aortic Atherosclerosis (ICD10-I70.0). Electronically Signed   By: JMarlaine HindM.D.   On: 05/27/2022 13:30   DG CHEST PORT 1 VIEW  Result Date: 05/26/2022 CLINICAL DATA:  Cough, CVA EXAM: PORTABLE CHEST 1 VIEW COMPARISON:  05/16/2022 chest radiograph. FINDINGS: Partially visualized surgical hardware from ACDF. Stable cardiomediastinal silhouette  with normal heart size. No pneumothorax. No pleural effusion. Lungs appear clear, with no acute consolidative airspace disease and no pulmonary edema. IMPRESSION: No active disease. Electronically Signed   By: JIlona SorrelM.D.   On: 05/26/2022 15:22      Scheduled Meds:  amLODipine  10 mg Oral Daily   vitamin C  500 mg Oral Daily   aspirin EC  81 mg Oral Daily   [START ON 06/02/2022] atorvastatin  40 mg Oral Daily   chlorthalidone  25 mg Oral Daily   cholecalciferol  2,000 Units Oral Daily   cloNIDine  0.1 mg Oral QHS   clopidogrel  75 mg Oral Daily   enoxaparin (LOVENOX) injection  30 mg Subcutaneous Q24H   ezetimibe  10 mg Oral Daily   famotidine  10 mg Oral Daily   hydrALAZINE  100 mg Oral TID   nirmatrelvir/ritonavir EUA (renal dosing)  2 tablet Oral BID   topiramate  25 mg Oral BID   zinc sulfate  220 mg Oral Daily   Continuous Infusions:  sodium chloride     cefTRIAXone (ROCEPHIN)  IV       LOS: 2 days     JDessa Phi DO [facility] 05/28/2022, 12:59 PM   Available via Epic secure chat 7am-7pm After these hours, please refer to coverage provider listed on [email or web address]

## 2022-05-28 NOTE — Progress Notes (Signed)
Inpatient Rehabilitation Care Coordinator Discharge Note   Patient Details  Name: Robin Arellano MRN: [id number] Date of Birth: 08-16-1951   Discharge location: TRANSFERRED TO ACUTE DUE TO MEDICAL ISSUES  Length of Stay: 4 DAYS  Discharge activity level: MIN-MOD  Home/community participation: ACTIVE  Patient response IO:XBDZHG Literacy - How often do you need to have someone help you when you read instructions, pamphlets, or other written material from your doctor or pharmacy?: Never  Patient response DJ:MEQAST Isolation - How often do you feel lonely or isolated from those around you?: Sometimes  Services provided included: MD, RD, PT, OT, RN, CM, Pharmacy, SW  Financial Services:  Charity fundraiser Utilized: [place] offered to/list presented to: PT  Follow-up services arranged:  Other (Comment) (TRANSFERRED TO ACUTE)           Patient response to transportation need: Is the patient able to respond to transportation needs?: Yes In the past 12 months, has lack of transportation kept you from medical appointments or from getting medications?: No In the past 12 months, has lack of transportation kept you from meetings, work, or from getting things needed for daily living?: No    Comments (or additional information):TRANSFERRED TO ACUTE DUE TO MEDICAL ISSUES.   Patient/Family verbalized understanding of follow-up arrangements:  Yes  Individual responsible for coordination of the follow-up plan: Robin Arellano [phone]  Confirmed correct DME delivered: Robin Arellano 05/28/2022    Robin Arellano, Robin Arellano

## 2022-05-28 NOTE — Plan of Care (Signed)
  Problem: Education: Goal: Knowledge of risk factors and measures for prevention of condition will improve Outcome: Completed/Met

## 2022-05-28 NOTE — Progress Notes (Signed)
Speech Language Pathology Note  Patient Details  Name: [name] MRN: [id number] Date of Birth: 08/11/1951 Today's Date: 05/28/2022                                                                                Speech Therapy Discharge Note  This patient was unable to complete the inpatient rehab program due to a change in medical status ; therefore, the patient did not meet their long term goals and has been discharged from skilled SLP services at this time.The patient left the program at a Min-Mod assist level for overall cognitive functioning. The patient is currently consuming regular textures with thin liquids with Mod I assist for use of swallowing compensatory strategies.   See CareTool for functional status details.  If the patient is able to return to inpatient rehabilitation within 3 midnights, this may be considered an interrupted stay and therapy services will resume as ordered. Modification and reinstatement of their goals will be made upon completion of therapy service reevaluations.     [place], [place] 05/28/2022, 4:06 PM   Weston Anna, MA, CCC-SLP

## 2022-05-29 LAB — COMPREHENSIVE METABOLIC PANEL
ALT: 38 U/L (ref 0–44)
AST: 62 U/L — ABNORMAL HIGH (ref 15–41)
Albumin: 3.2 g/dL — ABNORMAL LOW (ref 3.5–5.0)
Alkaline Phosphatase: 105 U/L (ref 38–126)
Anion gap: 15 (ref 5–15)
BUN: 32 mg/dL — ABNORMAL HIGH (ref 8–23)
CO2: 14 mmol/L — ABNORMAL LOW (ref 22–32)
Calcium: 8.3 mg/dL — ABNORMAL LOW (ref 8.9–10.3)
Chloride: 107 mmol/L (ref 98–111)
Creatinine, Ser: 2.35 mg/dL — ABNORMAL HIGH (ref 0.44–1.00)
GFR, Estimated: 22 mL/min — ABNORMAL LOW (ref >60)
Glucose, Bld: 102 mg/dL — ABNORMAL HIGH (ref 70–99)
Potassium: 3.2 mmol/L — ABNORMAL LOW (ref 3.5–5.1)
Sodium: 136 mmol/L (ref 135–145)
Total Bilirubin: 0.7 mg/dL (ref 0.3–1.2)
Total Protein: 7.3 g/dL (ref 6.5–8.1)

## 2022-05-29 LAB — CBC WITH DIFFERENTIAL/PLATELET
Abs Immature Granulocytes: 0.02 10*3/uL (ref 0.00–0.07)
Basophils Absolute: 0 10*3/uL (ref 0.0–0.1)
Basophils Relative: 0 %
Eosinophils Absolute: 0 10*3/uL (ref 0.0–0.5)
Eosinophils Relative: 0 %
HCT: 30.7 % — ABNORMAL LOW (ref 36.0–46.0)
Hemoglobin: 10.4 g/dL — ABNORMAL LOW (ref 12.0–15.0)
Immature Granulocytes: 0 %
Lymphocytes Relative: 16 %
Lymphs Abs: 0.7 10*3/uL (ref 0.7–4.0)
MCH: 32 pg (ref 26.0–34.0)
MCHC: 33.9 g/dL (ref 30.0–36.0)
MCV: 94.5 fL (ref 80.0–100.0)
Monocytes Absolute: 0.8 10*3/uL (ref 0.1–1.0)
Monocytes Relative: 16 %
Neutro Abs: 3.1 10*3/uL (ref 1.7–7.7)
Neutrophils Relative %: 68 %
Platelets: 267 10*3/uL (ref 150–400)
RBC: 3.25 MIL/uL — ABNORMAL LOW (ref 3.87–5.11)
RDW: 14 % (ref 11.5–15.5)
WBC: 4.6 10*3/uL (ref 4.0–10.5)
nRBC: 0 % (ref 0.0–0.2)

## 2022-05-29 LAB — MAGNESIUM: Magnesium: 2 mg/dL (ref 1.7–2.4)

## 2022-05-29 LAB — D-DIMER, QUANTITATIVE: D-Dimer, Quant: 2.68 ug/mL-FEU — ABNORMAL HIGH (ref 0.00–0.50)

## 2022-05-29 LAB — C-REACTIVE PROTEIN: CRP: 8.2 mg/dL — ABNORMAL HIGH (ref <1.0)

## 2022-05-29 MED ORDER — POTASSIUM CHLORIDE CRYS ER 20 MEQ PO TBCR
40.0000 meq | EXTENDED_RELEASE_TABLET | ORAL | Status: AC
  Administered 2022-05-29 (×2): 40 meq via ORAL
  Filled 2022-05-29 (×2): qty 2

## 2022-05-29 NOTE — Progress Notes (Signed)
Physical Therapy Treatment Patient Details Name: Robin Arellano MRN: [id number] DOB: 1951-07-13 Today's Date: 05/29/2022   History of Present Illness 71 year old female with recent diagnosis of left PCA stroke with eventual discharge inpatient rehab on 7/11 presented back to the medical service due to new onset flulike symptoms, fever of 102.5 F and COVID PCR positivity; with past medical history of fibromyalgia, chronic low back pain status post L4-L5 posterior lumbar fusion 6/15, coronary artery disease (S/P cath 11/2018 mild nonobstructive CAD), hyperlipidemia, chronic kidney disease stage IIIb (baseline Cr 1.6-1.7), 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 Agreeable to sit up on EoB to clean off tray table, once she has things organized she is agreeable to transfer to recliner but refuses ambulation. Pt is min A for bed mobility and transfers with RW. D/c plans remain appropriate at this time. PT will continue to follow acutely.    Recommendations for follow up therapy are one component of a multi-disciplinary discharge planning process, led by the attending physician.  Recommendations may be updated based on patient status, additional functional criteria and insurance authorization.  Follow Up Recommendations  Acute inpatient rehab (3hours/day)     Assistance Recommended at Discharge Frequent or constant Supervision/Assistance  Patient can return home with the following A little help with walking and/or transfers;Assistance with cooking/housework;Direct supervision/assist for medications management;Direct supervision/assist for financial management;Assist for transportation;Help with stairs or ramp for entrance;A lot of help with bathing/dressing/bathroom   Equipment Recommendations  Rolling  walker (2 wheels);BSC/3in1       Precautions / Restrictions Precautions Precautions: Back;Fall (recent spinal L4-L5 sx on 04/26/22) Precaution Booklet Issued: No Required Braces or Orthoses: Spinal Brace Spinal Brace: Applied in sitting position;Lumbar corset;Other (comment) Spinal Brace Comments: LSO in room Restrictions Weight Bearing Restrictions: No     Mobility  Bed Mobility Overal bed mobility: Needs Assistance Bed Mobility: Rolling, Sidelying to Sit Rolling: Min guard, +2 for safety/equipment Sidelying to sit: Min assist       General bed mobility comments: able to roll onto L side, able to manage LE off bed requires min A for bringing trunk to upright    Transfers Overall transfer level: Needs assistance Equipment used: Rolling walker (2 wheels) Transfers: Sit to/from Stand Sit to Stand: Min assist           General transfer comment: Min assist to steady standing from bed with RW    Ambulation/Gait               General Gait Details: declines due to nausea       Modified Rankin (Stroke Patients Only) Modified Rankin (Stroke Patients Only) Pre-Morbid Rankin Score: Moderate disability Modified Rankin: Moderately severe disability     Balance Overall balance assessment: Needs assistance   Sitting balance-Leahy Scale: Fair Sitting balance - Comments: close supervision   Standing balance support: Single extremity supported Standing balance-Leahy Scale: Poor Standing balance comment: requires UE support                            Cognition Arousal/Alertness: Awake/alert Behavior During Therapy: WFL for tasks assessed/performed Overall Cognitive Status: Impaired/Different from baseline Area of Impairment: Awareness, Problem solving, Safety/judgement, Attention, Memory  Current Attention Level: Sustained, Selective Memory: Decreased short-term memory   Safety/Judgement: Decreased awareness of deficits,  Decreased awareness of safety Awareness: Emergent Problem Solving: Requires verbal cues, Requires tactile cues             General Comments General comments (skin integrity, edema, etc.): VSS on RA, c/o nausea      Pertinent Vitals/Pain Pain Assessment Pain Assessment: 0-10 Pain Score: 9  Pain Location: Bil Hips Pain Descriptors / Indicators: Grimacing, Guarding Pain Intervention(s): Limited activity within patient's tolerance, Monitored during session, Repositioned     PT Goals (current goals can now be found in the care plan section) Acute Rehab PT Goals Patient Stated Goal: less body aches PT Goal Formulation: With patient Time For Goal Achievement: 06/03/22 Potential to Achieve Goals: Good Progress towards PT goals: Progressing toward goals    Frequency    Min 3X/week      PT Plan Current plan remains appropriate       AM-PAC PT "6 Clicks" Mobility   Outcome Measure  Help needed turning from your back to your side while in a flat bed without using bedrails?: A Little Help needed moving from lying on your back to sitting on the side of a flat bed without using bedrails?: A Little Help needed moving to and from a bed to a chair (including a wheelchair)?: A Little Help needed standing up from a chair using your arms (e.g., wheelchair or bedside chair)?: A Little Help needed to walk in hospital room?: A Lot Help needed climbing 3-5 steps with a railing? : Total 6 Click Score: 15    End of Session Equipment Utilized During Treatment: Back brace Activity Tolerance: Patient tolerated treatment well Patient left: in chair;with call bell/phone within reach;with chair alarm set Nurse Communication: Mobility status;Precautions PT Visit Diagnosis: Unsteadiness on feet (R26.81);Muscle weakness (generalized) (M62.81);History of falling (Z91.81);Repeated falls (R29.6);Difficulty in walking, not elsewhere classified (R26.2);Dizziness and giddiness (R42)     Time:  4431-5400 PT Time Calculation (min) (ACUTE ONLY): 25 min  Charges:  $Therapeutic Activity: 23-37 mins                     Angelina Venard B. Migdalia Dk PT, DPT Acute Rehabilitation Services Please use secure chat or  Call Office [phone]    [place] 05/29/2022, 11:43 AM

## 2022-05-29 NOTE — Progress Notes (Signed)
Patient continues to have loose bowel movements. Watery (brown in color). Will make MD aware in am.

## 2022-05-29 NOTE — Progress Notes (Addendum)
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  She was started on Paxlovid.  Has been having some waxing and waning confusion episodes since her stroke 2 weeks ago.  New events last 24 hours / Subjective: Her confusion has cleared up today.  She is alert and oriented to self, place, year and situation.  She continues to have some diarrhea, documented type 7 stool x2 in the last 24 hours.  Feels sore all over, unable to determine if she actually has abdominal pain or generalized musculoskeletal pain.  She is tender to palpation on her abdomen as well as her legs.  She does not want to go back to CIR, but her husband has said that he is unable to care for her at home in her current state.  She may have to discharge to SNF pending her improvement.  Assessment & Plan:   Principal Problem:   COVID-19 virus infection Active Problems:   Acute left PCA stroke (HCC)   Essential hypertension   Coronary artery disease involving native coronary artery of native heart without angina pectoris   Chronic kidney disease, stage 3b (HCC)   Mixed hyperlipidemia   Pressure injury of skin   COVID-19  -Tested positive on 05/26/22 -Continue paxlovid x 5 days -- addendum: stopped 7/18 due to rise in Cr  -Remains stable on room air -Supportive treatments as  ordered, encourage IS, encourage prone positioning, encourage mobilization  -CTA chest negative for PE -IV fluids for decreased oral intake  COVID-19 Labs  Recent Labs    05/26/22 2254 05/28/22 0256 05/29/22 0547  DDIMER 2.38* 3.10* 2.68*  CRP 6.9* 10.3* 8.2*     Lab Results  Component Value Date   SARSCOV2NAA POSITIVE (A) 05/26/2022   [place] NEGATIVE 07/19/2020   [place] NEGATIVE 05/09/2020   [place] NEGATIVE 23/76/2831    Acute metabolic encephalopathy -Seems to have waxed and waned in the last 2 weeks since her stroke, per husband's history -Could also be in setting of hospital delirium, COVID, UTI -Ammonia level normal -Improved today  UTI -UA with trace leukocytes, positive nitrite, many bacteria -Urine culture shows multiple species -Rocephin x5 days  Left PCA stroke CAD -Plavix, aspirin, Lipitor -PT  Hypertension -Norvasc, Catapres, hydralazine -Hold chlorthalidone for AKI   AKI on CKD stage IIIb -Baseline creatinine 1.9 -IVF   Hypokalemia -Replace  Diarrhea -Likely in setting of COVID.  Check GI PCR     In agreement with assessment of the pressure ulcer as below:  Pressure Injury 05/26/22 Buttocks Left Stage 2 -  Partial thickness loss of dermis presenting as a shallow open injury with a red, pink wound bed without slough. red, pink (Active)  05/26/22 2045  Location: Buttocks  Location Orientation: Left  Staging: Stage 2 -  Partial thickness loss of dermis presenting as a shallow open injury with a red, pink wound bed without slough.  Wound Description (Comments):  red, pink  Present on Admission: Yes  Dressing Type Foam - Lift dressing to assess site every shift 05/28/22 2039         DVT prophylaxis:  enoxaparin (LOVENOX) injection 30 mg Start: 05/27/22 1000  Code Status: Full code Family Communication: No family at bedside, spoke with husband over phone 7/17 Disposition Plan:  Status is: Inpatient Remains inpatient  appropriate because: Continue treatment for COVID-19, back to CIR once completed with isolation (7/26) vs discharge to SNF/home    Antimicrobials:  Anti-infectives (From admission, onward)    Start     Dose/Rate Route Frequency Ordered Stop   05/28/22 1300  cefTRIAXone (ROCEPHIN) 1 g in sodium chloride 0.9 % 100 mL IVPB        1 g 200 mL/hr over 30 Minutes Intravenous Every 24 hours 05/28/22 1209 06/02/22 1259   05/27/22 0115  nirmatrelvir/ritonavir EUA (renal dosing) (PAXLOVID) 2 tablet        2 tablet Oral 2 times daily 05/27/22 0017 05/31/22 2159        Objective: Vitals:   05/28/22 0439 05/28/22 1646 05/28/22 2017 05/29/22 0443  BP: (!) 170/90 139/69 (!) 182/70 (!) 164/64  Pulse: 90 83 80 81  Resp: '20 17 20 20  '$ Temp: (!) 100.7 F (38.2 C) 98.6 F (37 C) 98.7 F (37.1 C) 98.3 F (36.8 C)  TempSrc: Oral Oral Oral Oral  SpO2: 97% 95% 98% 94%  Weight:      Height:        Intake/Output Summary (Last 24 hours) at 05/29/2022 1025 Last data filed at 05/29/2022 0700 Gross per 24 hour  Intake 2161.35 ml  Output 300 ml  Net 1861.35 ml    Filed Weights   05/26/22 2256  Weight: 64.6 kg    Examination:  General exam: Appears calm and comfortable  Respiratory system: CTAB.  Respiratory effort normal. No respiratory distress. No conversational dyspnea.  On room air Central nervous system: Alert and oriented x 3. Speech clear. No focal deficits, moves all extremities, CN 2-12 grossly in tact  Extremities: Symmetric in appearance  Psychiatry: Judgement and insight appear stable   Data Reviewed: I have personally reviewed following labs and imaging studies  CBC: Recent Labs  Lab 05/23/22 0724 05/26/22 1255 05/28/22 0256 05/29/22 0547  WBC 9.0 8.3 9.0 4.6  NEUTROABS 6.3 7.1 7.2 3.1  HGB 10.0* 10.5* 10.4* 10.4*  HCT 30.6* 30.7* 31.1* 30.7*  MCV 99.4 95.6 95.4 94.5  PLT 316 254 241 893    Basic Metabolic Panel: Recent Labs  Lab 05/23/22 0724 05/26/22 1255  05/26/22 2254 05/27/22 0746 05/28/22 0256 05/29/22 0547  NA 137 135  --  134* 131* 136  K 4.5 3.1*  --  3.4* 3.4* 3.2*  CL 105 98  --  101 100 107  CO2 24 20*  --  19* 17* 14*  GLUCOSE 104* 101*  --  94 94 102*  BUN 16 18  --  16 18 32*  CREATININE 1.75* 1.81*  --  1.72* 1.92* 2.35*  CALCIUM 9.5 9.4  --  8.9 8.9 8.3*  MG  --   --  2.2  --  2.0 2.0    GFR: Estimated Creatinine Clearance: 19.9 mL/min (A) (by C-G formula based on SCr of 2.35 mg/dL (H)). Liver Function Tests: Recent Labs  Lab 05/23/22 0724 05/28/22 0256 05/29/22 0547  AST 24 88* 62*  ALT 17 45* 38  ALKPHOS 115 119 105  BILITOT 0.3 0.6 0.7  PROT  7.3 7.4 7.3  ALBUMIN 3.4* 3.5 3.2*    No results for input(s): "LIPASE", "AMYLASE" in the last 168 hours. Recent Labs  Lab 05/28/22 0954  AMMONIA 24    Coagulation Profile: No results for input(s): "INR", "PROTIME" in the last 168 hours. Cardiac Enzymes: No results for input(s): "CKTOTAL", "CKMB", "CKMBINDEX", "TROPONINI" in the last 168 hours. BNP (last 3 results) No results for input(s): "PROBNP" in the last 8760 hours. HbA1C: No results for input(s): "HGBA1C" in the last 72 hours. CBG: Recent Labs  Lab 05/26/22 1636 05/27/22 0759 05/27/22 1207  GLUCAP 122* 96 92    Lipid Profile: No results for input(s): "CHOL", "HDL", "LDLCALC", "TRIG", "CHOLHDL", "LDLDIRECT" in the last 72 hours. Thyroid Function Tests: No results for input(s): "TSH", "T4TOTAL", "FREET4", "T3FREE", "THYROIDAB" in the last 72 hours. Anemia Panel: No results for input(s): "VITAMINB12", "FOLATE", "FERRITIN", "TIBC", "IRON", "RETICCTPCT" in the last 72 hours. Sepsis Labs: Recent Labs  Lab 05/26/22 1612 05/26/22 2254  PROCALCITON  --  0.31  LATICACIDVEN 0.6 0.8     Recent Results (from the past 240 hour(s))  Urine Culture     Status: Abnormal   Collection Time: 05/19/22  6:27 PM   Specimen: Urine, Clean Catch  Result Value Ref Range Status   Specimen Description URINE,  CLEAN CATCH  Final   Special Requests   Final    NONE Performed at [facility], [place] [address] [place], [place] [address]    Culture 50,000 COLONIES/mL ESCHERICHIA COLI (A)  Final   Report Status 05/21/2022 FINAL  Final   Organism ID, Bacteria ESCHERICHIA COLI (A)  Final      Susceptibility   Escherichia coli - MIC*    AMPICILLIN <=2 SENSITIVE Sensitive     CEFAZOLIN <=4 SENSITIVE Sensitive     CEFEPIME <=0.12 SENSITIVE Sensitive     CEFTRIAXONE <=0.25 SENSITIVE Sensitive     CIPROFLOXACIN <=0.25 SENSITIVE Sensitive     GENTAMICIN <=1 SENSITIVE Sensitive     IMIPENEM <=0.25 SENSITIVE Sensitive     NITROFURANTOIN <=16 SENSITIVE Sensitive     TRIMETH/SULFA <=20 SENSITIVE Sensitive     AMPICILLIN/SULBACTAM <=2 SENSITIVE Sensitive     PIP/TAZO <=4 SENSITIVE Sensitive     * 50,000 COLONIES/mL ESCHERICHIA COLI  Resp Panel by RT-PCR (Flu A&B, Covid)     Status: Abnormal   Collection Time: 05/26/22  2:30 PM  Result Value Ref Range Status   SARS Coronavirus 2 by RT PCR POSITIVE (A) NEGATIVE Final    Comment: (NOTE) SARS-CoV-2 target nucleic acids are DETECTED.  The SARS-CoV-2 RNA is generally detectable in upper respiratory specimens during the acute phase of infection. Positive results are indicative of the presence of the identified virus, but do not rule out bacterial infection or co-infection with other pathogens not detected by the test. Clinical correlation with patient history and other diagnostic information is necessary to determine patient infection status. The expected result is Negative.  Fact Sheet for Patients: EntrepreneurPulse.com.au  Fact Sheet for Healthcare Providers: IncredibleEmployment.be  This test is not yet approved or cleared by the [place] FDA and  has been authorized for detection and/or diagnosis of SARS-CoV-2 by FDA under an Emergency Use Authorization (EUA).  This EUA will remain in effect (meaning  this test can be used) for the duration of  the COVID-19 declaration under Section 564(b)(1) of the A ct, 21 U.S.C. section 360bbb-3(b)(1), unless the authorization is terminated or revoked sooner.     Influenza A by PCR  NEGATIVE NEGATIVE Final   Influenza B by PCR NEGATIVE NEGATIVE Final    Comment: (NOTE) The Xpert Xpress SARS-CoV-2/FLU/RSV plus assay is intended as an aid in the diagnosis of influenza from Nasopharyngeal swab specimens and should not be used as a sole basis for treatment. Nasal washings and aspirates are unacceptable for Xpert Xpress SARS-CoV-2/FLU/RSV testing.  Fact Sheet for Patients: EntrepreneurPulse.com.au  Fact Sheet for Healthcare Providers: IncredibleEmployment.be  This test is not yet approved or cleared by the [place] FDA and has been authorized for detection and/or diagnosis of SARS-CoV-2 by FDA under an Emergency Use Authorization (EUA). This EUA will remain in effect (meaning this test can be used) for the duration of the COVID-19 declaration under Section 564(b)(1) of the Act, 21 U.S.C. section 360bbb-3(b)(1), unless the authorization is terminated or revoked.  Performed at [facility], [place] [address], [place], [place] [address]   Respiratory (~20 pathogens) panel by PCR     Status: None   Collection Time: 05/26/22  2:30 PM  Result Value Ref Range Status   Adenovirus NOT DETECTED NOT DETECTED Final   Coronavirus 229E NOT DETECTED NOT DETECTED Final    Comment: (NOTE) The Coronavirus on the Respiratory Panel, DOES NOT test for the novel  Coronavirus (2019 nCoV)    Coronavirus HKU1 NOT DETECTED NOT DETECTED Final   Coronavirus NL63 NOT DETECTED NOT DETECTED Final   Coronavirus OC43 NOT DETECTED NOT DETECTED Final   Metapneumovirus NOT DETECTED NOT DETECTED Final   Rhinovirus / Enterovirus NOT DETECTED NOT DETECTED Final   Influenza A NOT DETECTED NOT DETECTED Final   Influenza B NOT DETECTED  NOT DETECTED Final   Parainfluenza Virus 1 NOT DETECTED NOT DETECTED Final   Parainfluenza Virus 2 NOT DETECTED NOT DETECTED Final   Parainfluenza Virus 3 NOT DETECTED NOT DETECTED Final   Parainfluenza Virus 4 NOT DETECTED NOT DETECTED Final   Respiratory Syncytial Virus NOT DETECTED NOT DETECTED Final   Bordetella pertussis NOT DETECTED NOT DETECTED Final   Bordetella Parapertussis NOT DETECTED NOT DETECTED Final   Chlamydophila pneumoniae NOT DETECTED NOT DETECTED Final   Mycoplasma pneumoniae NOT DETECTED NOT DETECTED Final    Comment: Performed at [facility], [place] [address], [place], [place] [address]  Culture, blood (Routine X 2) w Reflex to ID Panel     Status: None (Preliminary result)   Collection Time: 05/26/22  4:16 PM   Specimen: BLOOD  Result Value Ref Range Status   Specimen Description BLOOD RIGHT ANTECUBITAL  Final   Special Requests   Final    BOTTLES DRAWN AEROBIC AND ANAEROBIC Blood Culture adequate volume   Culture   Final    NO GROWTH 2 DAYS Performed at [facility], [place] [address] [place] [address] [place], [place] [address]    Report Status PENDING  Incomplete  Culture, blood (Routine X 2) w Reflex to ID Panel     Status: None (Preliminary result)   Collection Time: 05/26/22  4:16 PM   Specimen: BLOOD RIGHT FOREARM  Result Value Ref Range Status   Specimen Description BLOOD RIGHT FOREARM  Final   Special Requests   Final    BOTTLES DRAWN AEROBIC AND ANAEROBIC Blood Culture adequate volume   Culture   Final    NO GROWTH 2 DAYS Performed at [facility], [place] [address], [place],  [address]    Report Status PENDING  Incomplete  Urine Culture     Status: Abnormal   Collection  Time: 05/27/22  8:00 AM   Specimen: Urine, Clean Catch  Result Value Ref Range Status   Specimen Description URINE, CLEAN CATCH  Final   Special Requests   Final    NONE Performed at [facility], [address] [place], [place] [address]    Culture MULTIPLE  SPECIES PRESENT, SUGGEST RECOLLECTION (A)  Final   Report Status 05/28/2022 FINAL  Final      Radiology Studies: CT Angio Chest Pulmonary Embolism (PE) W or WO Contrast  Result Date: 05/27/2022 CLINICAL DATA:  Cough. Elevated D-dimer. COVID infection. Clinical suspicion for pulmonary embolism. EXAM: CT ANGIOGRAPHY CHEST WITH CONTRAST TECHNIQUE: Multidetector CT imaging of the chest was performed using the standard protocol during bolus administration of intravenous contrast. Multiplanar CT image reconstructions and MIPs were obtained to evaluate the vascular anatomy. RADIATION DOSE REDUCTION: This exam was performed according to the departmental dose-optimization program which includes automated exposure control, adjustment of the mA and/or kV according to patient size and/or use of iterative reconstruction technique. CONTRAST:  61m OMNIPAQUE IOHEXOL 350 MG/ML SOLN COMPARISON:  05/14/2022 FINDINGS: Cardiovascular: Satisfactory opacification of pulmonary arteries noted, and no pulmonary emboli identified. Stable small pseudoaneurysm or ductus diverticulum arising from the lateral aspect of the aortic arch, measuring 1.8 cm in diameter. Abundant atherosclerotic plaque seen throughout the aorta. Coronary atherosclerotic calcification also noted. Mediastinum/Nodes: No masses or pathologically enlarged lymph nodes identified. Lungs/Pleura: No pulmonary mass, infiltrate, or effusion. Upper abdomen: No acute findings. Musculoskeletal: No suspicious bone lesions identified. Review of the MIP images confirms the above findings. IMPRESSION: No evidence of pulmonary embolism or other acute findings. 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 Aortic Atherosclerosis (ICD10-I70.0). Electronically Signed   By: JMarlaine HindM.D.   On: 05/27/2022 13:30      Scheduled Meds:  amLODipine  10 mg Oral Daily   vitamin C  500 mg Oral Daily   aspirin EC  81 mg Oral  Daily   [START ON 06/02/2022] atorvastatin  40 mg Oral Daily   chlorthalidone  25 mg Oral Daily   cholecalciferol  2,000 Units Oral Daily   cloNIDine  0.1 mg Oral QHS   clopidogrel  75 mg Oral Daily   enoxaparin (LOVENOX) injection  30 mg Subcutaneous Q24H   ezetimibe  10 mg Oral Daily   famotidine  10 mg Oral Daily   hydrALAZINE  100 mg Oral TID   nirmatrelvir/ritonavir EUA (renal dosing)  2 tablet Oral BID   potassium chloride  40 mEq Oral Q4H   topiramate  25 mg Oral BID   zinc sulfate  220 mg Oral Daily   Continuous Infusions:  sodium chloride 100 mL/hr at 05/29/22 0831   cefTRIAXone (ROCEPHIN)  IV 1 g (05/28/22 1425)     LOS: 3 days     JDessa Phi DO [facility] 05/29/2022, 10:25 AM   Available via Epic secure chat 7am-7pm After these hours, please refer to coverage provider listed on [email or web address]

## 2022-05-29 NOTE — TOC Progression Note (Signed)
Transition of Care [facility]) - Initial/Assessment Note    Patient Details  Name: Robin Arellano MRN: [id number] Date of Birth: [birth year]  Transition of Care [facility]) CM/SW Contact:    Milinda Antis, LCSWA Phone Number: 05/29/2022, 10:06 AM  Clinical Narrative:                  Transition of Care Department [facility]) has reviewed patient.  Patient admitted from CIR and may be eligible to be considered for admit to CIR when cleared from airborne precautions.  CIR is following distantly.  We will continue to monitor patient advancement through interdisciplinary progression rounds.   Patient Goals and CMS Choice        Expected Discharge Plan and Services                                                Prior Living Arrangements/Services                       Activities of Daily Living      Permission Sought/Granted                  Emotional Assessment              Admission diagnosis:  COVID-19 virus infection [U07.1] Patient Active Problem List   Diagnosis Date Noted   Chronic kidney disease, stage 3b (Early) 05/27/2022   Mixed hyperlipidemia 05/27/2022   Pressure injury of skin 05/27/2022   COVID-19 virus infection 05/26/2022   Hyponatremia    Acute left PCA stroke ([place]) 05/22/2022   Hypertensive emergency    Pseudoaneurysm ([place]) 05/14/2022   Spondylolisthesis at L4-L5 level 04/26/2022   Hyperlipidemia 11/25/2020   Essential hypertension 07/11/2020   Common bile duct dilation    Acute pancreatitis 04/01/2020   Anxiety    Hypokalemia    Mild renal insufficiency    Elevated troponin    Hypertensive urgency    Elevated liver enzymes    Abnormal magnetic resonance imaging of abdomen    Coronary artery disease involving native coronary artery of native heart without angina pectoris    Chest pain    Herpes zoster without complication 37/90/2409   Abdominal pain 02/07/2018   Abdominal pain, epigastric 02/07/2018   Low back pain  07/09/2013   Neck pain 07/09/2013   Headache(784.0) 07/09/2013   Fibromyalgia 07/09/2013   PCP:  Sandi Mariscal, MD Pharmacy:   [facility] [place], [place] - [address] [place] [address] [place] [address] Phone: [phone] Fax: [phone]     Social Determinants of Health (SDOH) Interventions    Readmission Risk Interventions     No data to display

## 2022-05-29 NOTE — Progress Notes (Signed)
Inpatient Rehab Admissions Coordinator:    CIR following from a distance for potential re-admit until COVID precautions are lifted.   Clemens Catholic, [place], [place] Admissions Coordinator  [phone] ([place]) [phone] (office)

## 2022-05-30 DIAGNOSIS — I10 Essential (primary) hypertension: Secondary | ICD-10-CM | POA: Diagnosis not present

## 2022-05-30 DIAGNOSIS — G9341 Metabolic encephalopathy: Secondary | ICD-10-CM

## 2022-05-30 DIAGNOSIS — I63532 Cerebral infarction due to unspecified occlusion or stenosis of left posterior cerebral artery: Secondary | ICD-10-CM | POA: Diagnosis not present

## 2022-05-30 DIAGNOSIS — U071 COVID-19: Secondary | ICD-10-CM | POA: Diagnosis not present

## 2022-05-30 LAB — CBC WITH DIFFERENTIAL/PLATELET
Abs Immature Granulocytes: 0.02 10*3/uL (ref 0.00–0.07)
Basophils Absolute: 0 10*3/uL (ref 0.0–0.1)
Basophils Relative: 1 %
Eosinophils Absolute: 0 10*3/uL (ref 0.0–0.5)
Eosinophils Relative: 0 %
HCT: 27.4 % — ABNORMAL LOW (ref 36.0–46.0)
Hemoglobin: 9.1 g/dL — ABNORMAL LOW (ref 12.0–15.0)
Immature Granulocytes: 1 %
Lymphocytes Relative: 25 %
Lymphs Abs: 0.9 10*3/uL (ref 0.7–4.0)
MCH: 31.6 pg (ref 26.0–34.0)
MCHC: 33.2 g/dL (ref 30.0–36.0)
MCV: 95.1 fL (ref 80.0–100.0)
Monocytes Absolute: 0.5 10*3/uL (ref 0.1–1.0)
Monocytes Relative: 15 %
Neutro Abs: 2.2 10*3/uL (ref 1.7–7.7)
Neutrophils Relative %: 58 %
Platelets: 229 10*3/uL (ref 150–400)
RBC: 2.88 MIL/uL — ABNORMAL LOW (ref 3.87–5.11)
RDW: 14.1 % (ref 11.5–15.5)
WBC: 3.7 10*3/uL — ABNORMAL LOW (ref 4.0–10.5)
nRBC: 0 % (ref 0.0–0.2)

## 2022-05-30 LAB — GASTROINTESTINAL PANEL BY PCR, STOOL (REPLACES STOOL CULTURE)

## 2022-05-30 LAB — COMPREHENSIVE METABOLIC PANEL
ALT: 28 U/L (ref 0–44)
AST: 42 U/L — ABNORMAL HIGH (ref 15–41)
Albumin: 2.9 g/dL — ABNORMAL LOW (ref 3.5–5.0)
Alkaline Phosphatase: 88 U/L (ref 38–126)
Anion gap: 9 (ref 5–15)
BUN: 21 mg/dL (ref 8–23)
CO2: 16 mmol/L — ABNORMAL LOW (ref 22–32)
Calcium: 8.6 mg/dL — ABNORMAL LOW (ref 8.9–10.3)
Chloride: 108 mmol/L (ref 98–111)
Creatinine, Ser: 1.62 mg/dL — ABNORMAL HIGH (ref 0.44–1.00)
GFR, Estimated: 34 mL/min — ABNORMAL LOW (ref >60)
Glucose, Bld: 87 mg/dL (ref 70–99)
Potassium: 3.7 mmol/L (ref 3.5–5.1)
Sodium: 133 mmol/L — ABNORMAL LOW (ref 135–145)
Total Bilirubin: 0.7 mg/dL (ref 0.3–1.2)
Total Protein: 6.2 g/dL — ABNORMAL LOW (ref 6.5–8.1)

## 2022-05-30 LAB — C-REACTIVE PROTEIN: CRP: 2.8 mg/dL — ABNORMAL HIGH (ref <1.0)

## 2022-05-30 LAB — GLUCOSE, CAPILLARY: Glucose-Capillary: 112 mg/dL — ABNORMAL HIGH (ref 70–99)

## 2022-05-30 LAB — D-DIMER, QUANTITATIVE: D-Dimer, Quant: 2.2 ug/mL-FEU — ABNORMAL HIGH (ref 0.00–0.50)

## 2022-05-30 LAB — MAGNESIUM: Magnesium: 1.8 mg/dL (ref 1.7–2.4)

## 2022-05-30 MED ORDER — ORAL CARE MOUTH RINSE: 15.0000 mL | OROMUCOSAL | Status: DC | PRN

## 2022-05-30 NOTE — Progress Notes (Signed)
Physical Therapy Treatment Patient Details Name: Robin Arellano MRN: [id number] DOB: 11/16/1950 Today's Date: 05/30/2022   History of Present Illness 71 year old female with recent diagnosis of left PCA stroke with eventual discharge inpatient rehab on 7/11 presented back to the medical service due to new onset flulike symptoms, fever of 102.5 F and COVID PCR positivity; with past medical history of fibromyalgia, chronic low back pain status post L4-L5 posterior lumbar fusion 6/15, coronary artery disease (S/P cath 11/2018 mild nonobstructive CAD), hyperlipidemia, chronic kidney disease stage IIIb (baseline Cr 1.6-1.7), 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 Pt tangled in bed linens and requires min A for removing and coming to seated EoB. RN provided medication and orange juice. Pt reports that juice tastes, bad and that she can't eat because everything tastes bad. Once up at [facility] pt reports she wants to get out of bed, however refuses lumbar corset despite education on use to reduce pain. Once up in chair, provided pt with crackers and peanut butter, pt reports they taste moldy and like dirt. Pt able to tolerate Strawberry [place] Ice. Tried to have pt ambulate further but declines due to back pain. PT continues to recommend AIR, however per notes pt is refusing so she will need SNF level rehab at discharge. PT will continue to follow acutely.    Recommendations for follow up therapy are one component of a multi-disciplinary discharge planning process, led by the attending physician.  Recommendations may be updated based on patient status, additional functional criteria and insurance authorization.  Follow Up Recommendations  Acute inpatient rehab (3hours/day)     Assistance Recommended at Discharge Frequent or constant Supervision/Assistance  Patient can return  home with the following A little help with walking and/or transfers;Assistance with cooking/housework;Direct supervision/assist for medications management;Direct supervision/assist for financial management;Assist for transportation;Help with stairs or ramp for entrance;A lot of help with bathing/dressing/bathroom   Equipment Recommendations  Rolling walker (2 wheels);BSC/3in1       Precautions / Restrictions Precautions Precautions: Back;Fall (recent spinal L4-L5 sx on 04/26/22) Precaution Booklet Issued: No Required Braces or Orthoses: Spinal Brace Spinal Brace: Applied in sitting position;Lumbar corset;Other (comment) Spinal Brace Comments: LSO in room Restrictions Weight Bearing Restrictions: No     Mobility  Bed Mobility Overal bed mobility: Needs Assistance Bed Mobility: Rolling, Sidelying to Sit Rolling: Min guard Sidelying to sit: Min assist       General bed mobility comments: min guard to roll requires assist to bring trunk to upright, pt requesting to transfer to recliner but refuses lumbar corset    Transfers Overall transfer level: Needs assistance Equipment used: Rolling walker (2 wheels) Transfers: Sit to/from Stand Sit to Stand: Min assist           General transfer comment: Min assist to steady standing from bed with RW, vc for proximity to RW and for keeping inside RW. pt does not readily follow commands, require stopping and restarting    Ambulation/Gait               General Gait Details: declines due to nausea    Modified Rankin (Stroke Patients Only) Modified Rankin (Stroke Patients Only) Pre-Morbid Rankin Score: Moderate disability Modified Rankin: Moderately severe disability     Balance Overall balance assessment: Needs assistance   Sitting balance-Leahy Scale: Fair Sitting balance - Comments: close supervision   Standing balance  support: Single extremity supported Standing balance-Leahy Scale: Poor Standing balance comment:  requires UE support                            Cognition Arousal/Alertness: Awake/alert Behavior During Therapy: WFL for tasks assessed/performed Overall Cognitive Status: Impaired/Different from baseline Area of Impairment: Awareness, Problem solving, Safety/judgement, Attention, Memory                   Current Attention Level: Sustained, Selective Memory: Decreased short-term memory   Safety/Judgement: Decreased awareness of deficits, Decreased awareness of safety Awareness: Emergent Problem Solving: Requires verbal cues, Requires tactile cues             General Comments General comments (skin integrity, edema, etc.): VSS on RA, pt reports nausea and has not eaten breakfast. Reports everything tastes moldy or dirty. Attempted [place] and peanut butter. Pt reports she can not eat them. Pt sister brought watermelon yesterday and pt reports that is the only thing she has been able to eat. Pt requests Strawberry [place] Ice and can tolerate that.      Pertinent Vitals/Pain Pain Assessment Pain Assessment: Faces Pain Score: 9  Faces Pain Scale: Hurts even more Pain Location: Bil Hips and low back Pain Descriptors / Indicators: Grimacing, Guarding Pain Intervention(s): Limited activity within patient's tolerance, Monitored during session, Repositioned     PT Goals (current goals can now be found in the care plan section) Acute Rehab PT Goals Patient Stated Goal: less body aches PT Goal Formulation: With patient Time For Goal Achievement: 06/03/22 Potential to Achieve Goals: Good Progress towards PT goals: Not progressing toward goals - comment    Frequency    Min 3X/week      PT Plan Current plan remains appropriate       AM-PAC PT "6 Clicks" Mobility   Outcome Measure  Help needed turning from your back to your side while in a flat bed without using bedrails?: A Little Help needed moving from lying on your back to sitting on the side of a  flat bed without using bedrails?: A Little Help needed moving to and from a bed to a chair (including a wheelchair)?: A Little Help needed standing up from a chair using your arms (e.g., wheelchair or bedside chair)?: A Little Help needed to walk in hospital room?: A Lot Help needed climbing 3-5 steps with a railing? : Total 6 Click Score: 15    End of Session   Activity Tolerance: Patient limited by pain;Other (comment) (and nausea) Patient left: in chair;with call bell/phone within reach;with chair alarm set Nurse Communication: Mobility status;Precautions PT Visit Diagnosis: Unsteadiness on feet (R26.81);Muscle weakness (generalized) (M62.81);History of falling (Z91.81);Repeated falls (R29.6);Difficulty in walking, not elsewhere classified (R26.2);Dizziness and giddiness (R42)     Time: 9937-1696 PT Time Calculation (min) (ACUTE ONLY): 23 min  Charges:  $Therapeutic Activity: 23-37 mins                     Jaben Benegas B. Migdalia Dk PT, DPT Acute Rehabilitation Services Please use secure chat or  Call Office [phone]    [place] 05/30/2022, 10:31 AM

## 2022-05-30 NOTE — Discharge Summary (Signed)
Physical Therapy Discharge Note  This patient was unable to complete the inpatient rehab program due to change in medical status 2/2 +COVID; therefore did not meet their long term goals. Pt left the program at a supervision assist level for their functional mobility/ transfers. This patient is being discharged from PT services at this time.  Pt's perception of pain in the last five days was unable to answer at this time.    See CareTool for functional status details  If the patient is able to return to inpatient rehabilitation within 3 midnights, this may be considered an interrupted stay and therapy services will resume as ordered. Modification and reinstatement of their goals will be made upon completion of therapy service reevaluations.

## 2022-05-30 NOTE — Progress Notes (Signed)
Inpatient Rehab Admissions Coordinator:    I spoke with pt. And sister regarding return to CIR. We can potentially admit her prior to COVID precautions being lifted, but I will need to get insurance auth. OT needs to see her in order to get auth, so I have requested OT orders.  Clemens Catholic, [place], [place] Admissions Coordinator  [phone] ([place]) [phone] (office)

## 2022-05-30 NOTE — Hospital Course (Addendum)
71 year old woman recent diagnosis left PCA stroke with discharged to inpatient rehab 7/11, readmitted 7/18 to acute care for fever secondary to [place].  Plan for readmission to CIR when bed available; has had poorly controlled back pain, nausea and retching and poorly controlled HTN.

## 2022-05-30 NOTE — Progress Notes (Signed)
  Progress Note   Patient: Robin Arellano DGU:[id number] DOB: Jan 25, 1951 DOA: 05/26/2022     4 DOS: the patient was seen and examined on 05/30/2022   Brief hospital course: 71 year old woman recent diagnosis left PCA stroke with discharged to inpatient rehab 7/11, readmitted 7/18 to acute care for fever secondary to [place].  Plan for readmission to CIR when bed available.  Assessment and Plan: COVID-19 positive 05/26/22 --Paxlovid stopped 7/18 due to rise in Cr  --Remains stable on room air --Supportive treatments as ordered, encourage IS, encourage prone positioning, encourage mobilization  --CTA chest negative for PE --ddimer trending down  Acute metabolic encephalopathy --multifactorial, consistent with delirium by description, likely secondary to recent stroke, COVID, UTI --stable   UTI --appears uncomplicated, treat just 3 days total   Left PCA stroke, CAD --Plavix, aspirin, Lipitor --PT, OT  Essential Hypertension --Norvasc, Catapres, hydralazine --Hold chlorthalidone for AKI    AKI on CKD stage IIIb --Baseline creatinine 1.9 --AKI resolved   Diarrhea --Likely in setting of COVID.  Check GI PCR     Subjective:  Feels ok, a little SOB  Physical Exam: Vitals:   05/29/22 1700 05/30/22 0352 05/30/22 0827 05/30/22 1517  BP: (!) 160/75 (!) 175/77 (!) 173/94 (!) 187/74  Pulse: 65 72 69 75  Resp:  20    Temp:  98.6 F (37 C)    TempSrc:  Oral    SpO2:  99% 99% 99%  Weight:      Height:       Physical Exam Vitals reviewed.  Constitutional:      General: She is not in acute distress.    Appearance: She is not ill-appearing or toxic-appearing.  Cardiovascular:     Rate and Rhythm: Normal rate and regular rhythm.     Heart sounds: No murmur heard. Pulmonary:     Effort: Pulmonary effort is normal. No respiratory distress.     Breath sounds: No wheezing, rhonchi or rales.  Neurological:     Mental Status: She is alert.  Psychiatric:        Mood and  Affect: Mood normal.        Behavior: Behavior normal.    Data Reviewed:  Creatinine 1.62 Hgb 9.1 Ddimer 3.1 > 2.68 > 2.2  Family Communication: none  Disposition: Status is: Inpatient Remains inpatient appropriate because: COVID+, awaiting readmission to CIR  Planned Discharge Destination:  CIR    Time spent: 20 minutes  Author: Murray Hodgkins, MD 05/30/2022 9:18 PM  For on call review [email or web address].

## 2022-05-31 DIAGNOSIS — I10 Essential (primary) hypertension: Secondary | ICD-10-CM | POA: Diagnosis not present

## 2022-05-31 DIAGNOSIS — U071 COVID-19: Secondary | ICD-10-CM | POA: Diagnosis not present

## 2022-05-31 LAB — CULTURE, BLOOD (ROUTINE X 2)
Culture: NO GROWTH
Culture: NO GROWTH
Special Requests: ADEQUATE
Special Requests: ADEQUATE

## 2022-05-31 MED ORDER — ONDANSETRON HCL 4 MG/2ML IJ SOLN
4.0000 mg | Freq: Four times a day (QID) | INTRAMUSCULAR | Status: DC | PRN
  Filled 2022-05-31: qty 2

## 2022-05-31 MED ORDER — ONDANSETRON 4 MG PO TBDP
4.0000 mg | ORAL_TABLET | Freq: Three times a day (TID) | ORAL | Status: DC | PRN
  Administered 2022-05-31 – 2022-06-01 (×3): 4 mg via ORAL
  Filled 2022-05-31 (×3): qty 1

## 2022-05-31 NOTE — Progress Notes (Signed)
IP rehab admissions - Noted patient not feeling well and had nausea/vomiting earlier today.  Awaiting OT evaluation prior to contacting insurance for potential acute inpatient rehab admission.  Will have my partner follow up tomorrow.  Call for questions.  [phone]

## 2022-05-31 NOTE — Progress Notes (Signed)
Physical Therapy Treatment Patient Details Name: Robin Arellano MRN: [id number] DOB: July 09, 1951 Today's Date: 05/31/2022   History of Present Illness 71 year old female with recent diagnosis of left PCA stroke with eventual discharge inpatient rehab on 7/11 presented back to the medical service due to new onset flulike symptoms, fever of 102.5 F and COVID PCR positivity; with past medical history of fibromyalgia, chronic low back pain status post L4-L5 posterior lumbar fusion 6/15, coronary artery disease (S/P cath 11/2018 mild nonobstructive CAD), hyperlipidemia, chronic kidney disease stage IIIb (baseline Cr 1.6-1.7), 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 Patient agreed to OOB for clean-up and linen change. Refused use of her back brace and required cues to adhere to her back precautions (pt very internally distracted by nausea). Assisted pt to recliner, with washing parts of upper body, with donning clean gown and return to bed (refused to sit up in recliner due to nausea and generally feeling unwell).    Recommendations for follow up therapy are one component of a multi-disciplinary discharge planning process, led by the attending physician.  Recommendations may be updated based on patient status, additional functional criteria and insurance authorization.  Follow Up Recommendations  Acute inpatient rehab (3hours/day)     Assistance Recommended at Discharge Frequent or constant Supervision/Assistance  Patient can return home with the following A little help with walking and/or transfers;Assistance with cooking/housework;Direct supervision/assist for medications management;Direct supervision/assist for financial management;Assist for transportation;Help with stairs or ramp for entrance;A lot of help with bathing/dressing/bathroom   Equipment Recommendations   Rolling walker (2 wheels);BSC/3in1    Recommendations for Other Services       Precautions / Restrictions Precautions Precautions: Back;Fall (recent spinal L4-L5 sx on 04/26/22) Precaution Booklet Issued: No Precaution Comments: Pt needs verbal cues to follow precautions with mobility Required Braces or Orthoses: Spinal Brace Spinal Brace: Lumbar corset (to apply in sitting, pt refused) Spinal Brace Comments: LSO in room Restrictions Weight Bearing Restrictions: No Other Position/Activity Restrictions: Brace in room - LSO     Mobility  Bed Mobility Overal bed mobility: Needs Assistance Bed Mobility: Rolling, Sidelying to Sit, Sit to Sidelying Rolling: Min guard Sidelying to sit: Min assist     Sit to sidelying: Min assist General bed mobility comments: min guard to roll with cues to prevent twisting; requires assist to bring trunk to upright, pt requesting to transfer to recliner but refuses lumbar corset (wants to sit up long enough for linen to be changed); return to bed with assist for legs    Transfers Overall transfer level: Needs assistance Equipment used: 1 person hand held assist Transfers: Sit to/from Stand, Bed to chair/wheelchair/BSC Sit to Stand: Min assist   Step pivot transfers: Min assist       General transfer comment: Min assist to steady standing from bed. pt does not readily follow commands as distracted by severe nausea    Ambulation/Gait               General Gait Details: pt refused due to nausea with recent vomiting   Stairs             Wheelchair Mobility    Modified Rankin (Stroke Patients Only) Modified Rankin (Stroke Patients Only) Pre-Morbid Rankin Score: Moderate disability Modified Rankin: Moderately severe disability     Balance Overall balance assessment: Needs assistance Sitting-balance support: No upper extremity supported,  Feet supported Sitting balance-Leahy Scale: Fair Sitting balance - Comments: close  supervision   Standing balance support: Single extremity supported Standing balance-Leahy Scale: Poor Standing balance comment: requires UE support                            Cognition Arousal/Alertness: Awake/alert Behavior During Therapy: WFL for tasks assessed/performed Overall Cognitive Status: Impaired/Different from baseline Area of Impairment: Awareness, Problem solving, Safety/judgement, Memory, Following commands                     Memory: Decreased short-term memory, Decreased recall of precautions Following Commands: Follows one step commands with increased time, Follows one step commands inconsistently Safety/Judgement: Decreased awareness of deficits, Decreased awareness of safety Awareness: Emergent Problem Solving: Requires verbal cues, Requires tactile cues, Difficulty sequencing, Slow processing General Comments: pt internally distracted by nausea and earlier vomiting        Exercises      General Comments        Pertinent Vitals/Pain Pain Assessment Pain Assessment: Faces Faces Pain Scale: Hurts even more Pain Location: Bil Hips and low back Pain Descriptors / Indicators: Grimacing, Guarding Pain Intervention(s): Limited activity within patient's tolerance, Monitored during session    Home Living                          Prior Function            PT Goals (current goals can now be found in the care plan section) Acute Rehab PT Goals Patient Stated Goal: less body aches Time For Goal Achievement: 06/03/22 Potential to Achieve Goals: Good Progress towards PT goals: Not progressing toward goals - comment (N/V)    Frequency    Min 3X/week      PT Plan Current plan remains appropriate    Co-evaluation              AM-PAC PT "6 Clicks" Mobility   Outcome Measure  Help needed turning from your back to your side while in a flat bed without using bedrails?: A Little Help needed moving from lying on your  back to sitting on the side of a flat bed without using bedrails?: A Little Help needed moving to and from a bed to a chair (including a wheelchair)?: A Little Help needed standing up from a chair using your arms (e.g., wheelchair or bedside chair)?: A Little Help needed to walk in hospital room?: A Lot Help needed climbing 3-5 steps with a railing? : Total 6 Click Score: 15    End of Session Equipment Utilized During Treatment:  (pt refused use of brace) Activity Tolerance: Treatment limited secondary to medical complications (Comment) (nausea) Patient left: with call bell/phone within reach;in bed Nurse Communication: Mobility status;Precautions;Other (comment) (s/p vomiting; linens changed) PT Visit Diagnosis: Unsteadiness on feet (R26.81);Muscle weakness (generalized) (M62.81);History of falling (Z91.81);Repeated falls (R29.6);Difficulty in walking, not elsewhere classified (R26.2);Dizziness and giddiness (R42)     Time: 3007-6226 PT Time Calculation (min) (ACUTE ONLY): 30 min  Charges:  $Therapeutic Activity: 23-37 mins                      Arby Barrette, PT Acute Rehabilitation Services  Office [phone]    Rexanne Mano 05/31/2022, 12:27 PM

## 2022-05-31 NOTE — Progress Notes (Signed)
  Progress Note   Patient: Robin Arellano HUT:[id number] DOB: 05-08-1951 DOA: 05/26/2022     5 DOS: the patient was seen and examined on 05/31/2022   Brief hospital course: 71 year old woman recent diagnosis left PCA stroke with discharged to inpatient rehab 7/11, readmitted 7/18 to acute care for fever secondary to [place].  Plan for readmission to CIR when bed available.  Assessment and Plan: COVID-19 positive 05/26/22, with associated n/v --Paxlovid stopped 7/18 due to rise in Cr  --Remains stable on room air --Supportive treatments as ordered, encourage IS, encourage prone positioning, encourage mobilization  --CTA chest negative for PE --poor appetite. Antiemetics, supportive care.   Acute metabolic encephalopathy --multifactorial, consistent with delirium by description, likely secondary to recent stroke, COVID, UTI --appears resolbed   UTI --appears uncomplicated, treat just 3 days total   Left PCA stroke, CAD --Plavix, aspirin, Lipitor --PT, OT  Essential Hypertension --Norvasc, Catapres, hydralazine   AKI on CKD stage IIIb --Baseline creatinine 1.9 --AKI resolved   Diarrhea --Likely in setting of COVID.  Can return to CIR when able      Subjective:  Feels poorly N/v overnight, some today No abdominal pain other than some soreness from anticoagulant injections  Physical Exam: Vitals:   05/30/22 0827 05/30/22 1517 05/31/22 0957 05/31/22 1540  BP: (!) 173/94 (!) 187/74 (!) 147/92 (!) 198/80  Pulse: 69 75 90   Resp:   19   Temp:   98.2 F (36.8 C)   TempSrc:   Oral   SpO2: 99% 99% 97%   Weight:      Height:       Physical Exam Vitals reviewed.  Constitutional:      General: She is not in acute distress.    Appearance: She is not ill-appearing or toxic-appearing.  Cardiovascular:     Rate and Rhythm: Normal rate and regular rhythm.     Heart sounds: No murmur heard. Pulmonary:     Effort: Pulmonary effort is normal. No respiratory distress.      Breath sounds: No wheezing, rhonchi or rales.  Abdominal:     Palpations: Abdomen is soft.     Tenderness: There is abdominal tenderness (mild lower tenderness).  Skin:    Comments: Ecchymosis lower abdomen noted  Neurological:     Mental Status: She is alert.  Psychiatric:        Mood and Affect: Mood normal.        Behavior: Behavior normal.     Data Reviewed:  No new labs  Family Communication: none  Disposition: Status is: Inpatient Remains inpatient appropriate because: COVID+  Planned Discharge Destination:  CIR    Time spent: 20 minutes  Author: Murray Hodgkins, MD 05/31/2022 5:47 PM  For on call review [email or web address].

## 2022-05-31 NOTE — Plan of Care (Signed)
  Problem: Coping: Goal: Psychosocial and spiritual needs will be supported Outcome: Progressing   Problem: Respiratory: Goal: Will maintain a patent airway Outcome: Progressing Goal: Complications related to the disease process, condition or treatment will be avoided or minimized Outcome: Progressing   Problem: Health Behavior/Discharge Planning: Goal: Ability to manage health-related needs will improve Outcome: Progressing   Problem: Clinical Measurements: Goal: Ability to maintain clinical measurements within normal limits will improve Outcome: Progressing Goal: Will remain free from infection Outcome: Progressing Goal: Diagnostic test results will improve Outcome: Progressing Goal: Respiratory complications will improve Outcome: Progressing Goal: Cardiovascular complication will be avoided Outcome: Progressing   Problem: Activity: Goal: Risk for activity intolerance will decrease Outcome: Progressing    Problem: Pain Managment: Goal: General experience of comfort will improve Outcome: Progressing

## 2022-05-31 NOTE — Progress Notes (Signed)
Occupational Therapy Discharge Note  This patient was unable to complete the inpatient rehab program due to medical issues; therefore did not meet their long term goals. This patient is being discharged from OT services at this time.  BIMS at time of d/c  Pt unable to complete due to medical status  See CareTool for functional status details.  If the patient is able to return to inpatient rehabilitation within 3 midnights, this may be considered an interrupted stay and therapy services will resume as ordered. Modification and reinstatement of their goals will be made upon completion of therapy service reevaluations.

## 2022-05-31 NOTE — Evaluation (Signed)
Occupational Therapy Evaluation Patient Details Name: Robin Arellano MRN: [id number] DOB: 04/05/1951 Today's Date: 05/31/2022   History of Present Illness 71 year old female with recent diagnosis of left PCA stroke with eventual discharge inpatient rehab on 7/11 presented back to the medical service due to new onset flulike symptoms, fever of 102.5 F and COVID PCR positivity; with past medical history of fibromyalgia, chronic low back pain status post L4-L5 posterior lumbar fusion 6/15, coronary artery disease (S/P cath 11/2018 mild nonobstructive CAD), hyperlipidemia, chronic kidney disease stage IIIb (baseline Cr 1.6-1.7), 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 Currently she needs setup/S-Mod A for basic ADLs and VCs for back precautions. She will continue to benefit from acute OT with follow up on AIR.      Recommendations for follow up therapy are one component of a multi-disciplinary discharge planning process, led by the attending physician.  Recommendations may be updated based on patient status, additional functional criteria and insurance authorization.   Follow Up Recommendations  Acute inpatient rehab (3hours/day)    Assistance Recommended at Discharge Frequent or constant Supervision/Assistance  Patient can return home with the following A little help with bathing/dressing/bathroom;A little help with walking and/or transfers;Assistance with cooking/housework;Assist for transportation;Direct supervision/assist for financial management;Direct supervision/assist for medications management;Help with stairs or ramp for entrance    Functional Status Assessment  Patient has had a recent decline in their functional status and demonstrates the ability to make significant  improvements in function in a reasonable and predictable amount of time.  Equipment Recommendations  None recommended by OT       Precautions / Restrictions Precautions Precautions: Back;Fall (recent spinal L4-L5 sx on 04/26/22) Precaution Booklet Issued: No Precaution Comments: Pt able to state 2 of 3 precautions (need reminder for no lifting) Required Braces or Orthoses: Spinal Brace Spinal Brace: Lumbar corset;Applied in sitting position Spinal Brace Comments: LSO in room Restrictions Weight Bearing Restrictions: No Other Position/Activity Restrictions: Brace in room - LSO      Mobility Bed Mobility Overal bed mobility: Needs Assistance Bed Mobility: Rolling, Sidelying to Sit, Sit to Sidelying Rolling: Supervision Sidelying to sit: Min guard     Sit to sidelying: Min guard General bed mobility comments: VCs for getting back into bed    Transfers Overall transfer level: Needs assistance Equipment used: 1 person hand held assist Transfers: Sit to/from Stand, Bed to chair/wheelchair/BSC Sit to Stand: Min assist     Step pivot transfers: Min assist            Balance Overall balance assessment: Needs assistance Sitting-balance support: Feet supported Sitting balance-Leahy Scale: Good     Standing balance support: Bilateral upper extremity supported Standing balance-Leahy Scale: Poor                             ADL either performed or assessed with clinical judgement   ADL Overall ADL's : Needs assistance/impaired Eating/Feeding: Independent;Sitting   Grooming: Set up;Supervision/safety;Sitting   Upper Body Bathing: Set up;Sitting;Supervision/ safety   Lower Body Bathing: Moderate assistance Lower Body Bathing Details (indicate cue type and reason): min A sit<>stand Upper Body Dressing : Minimal assistance;Sitting   Lower Body Dressing: Moderate assistance Lower Body Dressing Details (indicate cue type and reason): min  A sit<>stand Toilet  Transfer: Minimal assistance;Stand-pivot;BSC/3in1   Toileting- Clothing Manipulation and Hygiene: Minimal assistance;Sit to/from stand               Vision Patient Visual Report: No change from baseline              Pertinent Vitals/Pain Pain Assessment Pain Assessment: Faces Faces Pain Scale: Hurts little more Pain Location: low back Pain Descriptors / Indicators: Aching, Sore Pain Intervention(s): Limited activity within patient's tolerance, Monitored during session, Repositioned     Hand Dominance Right   Extremity/Trunk Assessment Upper Extremity Assessment Upper Extremity Assessment: Generalized weakness           Communication Communication Communication: No difficulties   Cognition Arousal/Alertness: Awake/alert Behavior During Therapy: WFL for tasks assessed/performed Overall Cognitive Status: Impaired/Different from baseline Area of Impairment: Memory, Following commands, Safety/judgement                     Memory: Decreased recall of precautions Following Commands: Follows one step commands with increased time Safety/Judgement: Decreased awareness of safety                      Home Living Family/patient expects to be discharged to:: Inpatient rehab Living Arrangements: Spouse/significant other;Children Available Help at Discharge: Family;Available 24 hours/day Type of Home: House Home Access: Stairs to enter [employer] of Steps: 5 Entrance Stairs-Rails: Right;Left Home Layout: One level     Bathroom Shower/Tub: Walk-in Hydrologist: Standard     Home Equipment: BSC/3in1;Shower seat;Grab bars - toilet;Grab bars - tub/shower;Toilet riser;Rolling Walker (2 wheels);Kasandra Knudsen - single point      Lives With: Spouse;Daughter    Prior Functioning/Environment Prior Level of Function : Needs assist;History of Falls (last six months)               ADLs Comments: pt reports daughter or sister  assist with ADLs as needed        OT Problem List: Decreased range of motion;Impaired balance (sitting and/or standing);Decreased safety awareness;Decreased cognition;Pain      OT Treatment/Interventions: Self-care/ADL training;DME and/or AE instruction;Patient/family education;Balance training    OT Goals(Current goals can be found in the care plan section) Acute Rehab OT Goals Patient Stated Goal: to go back to rehab and then home with husband and dtr OT Goal Formulation: With patient Time For Goal Achievement: 06/14/22 Potential to Achieve Goals: Good  OT Frequency: Min 2X/week       AM-PAC OT "6 Clicks" Daily Activity     Outcome Measure Help from another person eating meals?: None Help from another person taking care of personal grooming?: A Little Help from another person toileting, which includes using toliet, bedpan, or urinal?: A Lot Help from another person bathing (including washing, rinsing, drying)?: A Lot Help from another person to put on and taking off regular upper body clothing?: A Little Help from another person to put on and taking off regular lower body clothing?: A Lot 6 Click Score: 16   End of Session Nurse Communication:  (family reports pt has sores on her butt but not a mepilex sacral bad--made RN aware)  Activity Tolerance:  (pt still feeling limited due to nausea, RN looking into options) Patient left:    OT Visit Diagnosis: Unsteadiness on feet (R26.81);Other abnormalities of gait and mobility (R26.89);Muscle weakness (generalized) (M62.81);Pain;History of falling (Z91.81);Other symptoms and signs involving cognitive function Pain - part of body:  (back)  Time: 5631-4970 OT Time Calculation (min): 34 min Charges:  OT General Charges $OT Visit: 1 Visit OT Evaluation $OT Eval Moderate Complexity: 1 Mod OT Treatments $Self Care/Home Management : 8-22 mins Golden Circle, OTR/L [facility] Re[facility]  [phone] Office [phone]    Almon Register 05/31/2022, 2:53 PM

## 2022-06-01 ENCOUNTER — Inpatient Hospital Stay (HOSPITAL_COMMUNITY): Payer: PPO

## 2022-06-01 DIAGNOSIS — K859 Acute pancreatitis without necrosis or infection, unspecified: Secondary | ICD-10-CM

## 2022-06-01 DIAGNOSIS — R112 Nausea with vomiting, unspecified: Secondary | ICD-10-CM

## 2022-06-01 LAB — BASIC METABOLIC PANEL
Anion gap: 14 (ref 5–15)
BUN: 13 mg/dL (ref 8–23)
CO2: 19 mmol/L — ABNORMAL LOW (ref 22–32)
Calcium: 9 mg/dL (ref 8.9–10.3)
Chloride: 103 mmol/L (ref 98–111)
Creatinine, Ser: 1.47 mg/dL — ABNORMAL HIGH (ref 0.44–1.00)
GFR, Estimated: 38 mL/min — ABNORMAL LOW (ref >60)
Glucose, Bld: 103 mg/dL — ABNORMAL HIGH (ref 70–99)
Potassium: 2.7 mmol/L — CL (ref 3.5–5.1)
Sodium: 136 mmol/L (ref 135–145)

## 2022-06-01 LAB — LIPASE, BLOOD: Lipase: 77 U/L — ABNORMAL HIGH (ref 11–51)

## 2022-06-01 LAB — MAGNESIUM: Magnesium: 1.7 mg/dL (ref 1.7–2.4)

## 2022-06-01 MED ORDER — ENOXAPARIN SODIUM 40 MG/0.4ML IJ SOSY
40.0000 mg | PREFILLED_SYRINGE | INTRAMUSCULAR | Status: DC
  Administered 2022-06-02 – 2022-06-11 (×8): 40 mg via SUBCUTANEOUS
  Filled 2022-06-01 (×9): qty 0.4

## 2022-06-01 MED ORDER — SODIUM CHLORIDE 0.9 % IV SOLN: INTRAVENOUS | Status: DC

## 2022-06-01 MED ORDER — ONDANSETRON HCL 4 MG/2ML IJ SOLN
4.0000 mg | Freq: Four times a day (QID) | INTRAMUSCULAR | Status: DC | PRN
  Administered 2022-06-01 – 2022-06-04 (×8): 4 mg via INTRAVENOUS
  Filled 2022-06-01 (×9): qty 2

## 2022-06-01 MED ORDER — HYDROMORPHONE HCL 1 MG/ML IJ SOLN
0.5000 mg | INTRAMUSCULAR | Status: DC | PRN
  Administered 2022-06-01 – 2022-06-03 (×9): 0.5 mg via INTRAVENOUS
  Filled 2022-06-01 (×10): qty 1

## 2022-06-01 MED ORDER — PROCHLORPERAZINE EDISYLATE 10 MG/2ML IJ SOLN
10.0000 mg | Freq: Four times a day (QID) | INTRAMUSCULAR | Status: DC | PRN
  Administered 2022-06-01 – 2022-06-03 (×4): 10 mg via INTRAVENOUS
  Filled 2022-06-01 (×4): qty 2

## 2022-06-01 MED ORDER — HYDRALAZINE HCL 20 MG/ML IJ SOLN
10.0000 mg | Freq: Four times a day (QID) | INTRAMUSCULAR | Status: DC
  Administered 2022-06-01 – 2022-06-02 (×3): 10 mg via INTRAVENOUS
  Filled 2022-06-01 (×3): qty 1

## 2022-06-01 MED ORDER — METHOCARBAMOL 1000 MG/10ML IJ SOLN
500.0000 mg | Freq: Three times a day (TID) | INTRAMUSCULAR | Status: DC | PRN
  Administered 2022-06-02 – 2022-06-12 (×4): 500 mg via INTRAVENOUS
  Filled 2022-06-01 (×5): qty 5

## 2022-06-01 MED ORDER — POTASSIUM CHLORIDE CRYS ER 20 MEQ PO TBCR
40.0000 meq | EXTENDED_RELEASE_TABLET | ORAL | Status: DC
  Administered 2022-06-01: 40 meq via ORAL
  Filled 2022-06-01 (×2): qty 2

## 2022-06-01 MED ORDER — POTASSIUM CHLORIDE 10 MEQ/100ML IV SOLN
10.0000 meq | INTRAVENOUS | Status: AC
  Administered 2022-06-01 (×6): 10 meq via INTRAVENOUS
  Filled 2022-06-01 (×6): qty 100

## 2022-06-01 MED ORDER — MAGNESIUM SULFATE 2 GM/50ML IV SOLN
2.0000 g | Freq: Once | INTRAVENOUS | Status: AC
  Administered 2022-06-01: 2 g via INTRAVENOUS
  Filled 2022-06-01: qty 50

## 2022-06-01 NOTE — Progress Notes (Signed)
Inpatient Rehab Admissions Coordinator:     I spoke with Pt.'s husband regarding potential CIR admit. I let him know that We will not have a bed on CIR until Monday at the earliest and he states that he would still like to pursue CIR admit for this pt. Once a bed on CIR becomes available. I will not open a case with her insurance for auth until it is clear when CIR will have beds.   Clemens Catholic, [place], [place] Admissions Coordinator  [phone] ([place]) [phone] (office)

## 2022-06-01 NOTE — Progress Notes (Signed)
IVT consulted for difficult PIV placement.  Upon arrival, RN at bedside and successfully got a PIV.

## 2022-06-01 NOTE — Plan of Care (Signed)
  Problem: Coping: Goal: Psychosocial and spiritual needs will be supported Outcome: Not Progressing   Problem: Respiratory: Goal: Will maintain a patent airway Outcome: Progressing Goal: Complications related to the disease process, condition or treatment will be avoided or minimized Outcome: Progressing   Problem: Health Behavior/Discharge Planning: Goal: Ability to manage health-related needs will improve Outcome: Progressing   Problem: Clinical Measurements: Goal: Ability to maintain clinical measurements within normal limits will improve Outcome: Progressing Goal: Will remain free from infection Outcome: Progressing Goal: Diagnostic test results will improve Outcome: Progressing Goal: Respiratory complications will improve Outcome: Progressing Goal: Cardiovascular complication will be avoided Outcome: Progressing   Problem: Activity: Goal: Risk for activity intolerance will decrease Outcome: Progressing   Problem: Nutrition: Goal: Adequate nutrition will be maintained Outcome: Not Progressing   Problem: Coping: Goal: Level of anxiety will decrease Outcome: Not Progressing   Problem: Elimination: Goal: Will not experience complications related to bowel motility Outcome: Progressing Goal: Will not experience complications related to urinary retention Outcome: Progressing   Problem: Pain Managment: Goal: General experience of comfort will improve Outcome: Progressing   Problem: Safety: Goal: Ability to remain free from injury will improve Outcome: Progressing   Problem: Skin Integrity: Goal: Risk for impaired skin integrity will decrease Outcome: Progressing

## 2022-06-01 NOTE — Progress Notes (Signed)
  Progress Note   Patient: Francenia Syra Arellano FAO:[id number] DOB: [birth year] DOA: 05/26/2022     6 DOS: the patient was seen and examined on 06/01/2022   Brief hospital course: 71 year old woman recent diagnosis left PCA stroke with discharged to inpatient rehab 7/11, readmitted 7/18 to acute care for fever secondary to [place].  Plan for readmission to CIR when bed available, but now with nausea and vomiting and mild pancreatitis.  Assessment and Plan: COVID-19 positive 05/26/22, with associated n/v --Paxlovid stopped 7/18 due to rise in Cr  --Remains stable on room air --Supportive treatments as ordered, encourage IS, encourage prone positioning, encourage mobilization  --CTA chest negative for PE  Nausea, vomiting, acute pancreatitis --Abdomen is sore from Lovenox injections but benign --Elevated lipase, in the context of her symptoms, this likely represents mild acute pancreatitis.  She does have a history of 1 episode of pancreatitis before. --N.p.o., IV fluids, antiemetics, analgesics. --If fails to improve, CT abdomen pelvis tomorrow. --Abdominal x-ray was unremarkable.   Acute metabolic encephalopathy --multifactorial, consistent with delirium by description, likely secondary to recent stroke, COVID, UTI -- Resolved   UTI --appears uncomplicated, treat just 3 days total   Left PCA stroke, CAD --Plavix, aspirin, Lipitor --PT, OT  Essential Hypertension --Norvasc, Catapres, hydralazine   AKI on CKD stage IIIb --Baseline creatinine 1.9 --AKI resolved   Diarrhea --Likely in setting of COVID.      Subjective:  Feels poorly Vomiting repeatedly Abd sore from anticoagulation injections Has a history of pancreatitis  Physical Exam: Vitals:   05/31/22 1540 06/01/22 0626 06/01/22 0809 06/01/22 1627  BP: (!) 198/80 (!) 161/85 (!) 174/85 (!) 179/76  Pulse:  74 80 90  Resp:  '20 16 18  '$ Temp:  98.6 F (37 C)  99 F (37.2 C)  TempSrc:  Oral  Oral  SpO2:  98% 97% 98%   Weight:      Height:       Physical Exam Vitals reviewed.  Constitutional:      General: She is not in acute distress.    Appearance: She is ill-appearing. She is not toxic-appearing.  Cardiovascular:     Rate and Rhythm: Normal rate and regular rhythm.     Heart sounds: No murmur heard. Pulmonary:     Effort: Pulmonary effort is normal. No respiratory distress.     Breath sounds: No wheezing or rales.  Abdominal:     General: Abdomen is flat.     Palpations: Abdomen is soft.     Tenderness: There is abdominal tenderness (generalized).  Neurological:     Mental Status: She is alert.  Psychiatric:        Mood and Affect: Mood normal.        Behavior: Behavior normal.     Data Reviewed:  K+ 2.7 Mg 1.7 Creatinine down to 1.47 Lipase 77 AXR negative  Family Communication: at bedside  Disposition: Status is: Inpatient Remains inpatient appropriate because: n/v, acute pancreatitis  Planned Discharge Destination:  [facility]    Time spent: 35 minutes  Author: Murray Hodgkins, MD 06/01/2022 7:29 PM  For on call review [email or web address].

## 2022-06-02 ENCOUNTER — Inpatient Hospital Stay (HOSPITAL_COMMUNITY): Payer: PPO

## 2022-06-02 LAB — COMPREHENSIVE METABOLIC PANEL
ALT: 25 U/L (ref 0–44)
AST: 33 U/L (ref 15–41)
Albumin: 3.3 g/dL — ABNORMAL LOW (ref 3.5–5.0)
Alkaline Phosphatase: 108 U/L (ref 38–126)
Anion gap: 12 (ref 5–15)
BUN: 12 mg/dL (ref 8–23)
CO2: 16 mmol/L — ABNORMAL LOW (ref 22–32)
Calcium: 8.5 mg/dL — ABNORMAL LOW (ref 8.9–10.3)
Chloride: 107 mmol/L (ref 98–111)
Creatinine, Ser: 1.35 mg/dL — ABNORMAL HIGH (ref 0.44–1.00)
GFR, Estimated: 42 mL/min — ABNORMAL LOW (ref >60)
Glucose, Bld: 115 mg/dL — ABNORMAL HIGH (ref 70–99)
Potassium: 3.1 mmol/L — ABNORMAL LOW (ref 3.5–5.1)
Sodium: 135 mmol/L (ref 135–145)
Total Bilirubin: 0.4 mg/dL (ref 0.3–1.2)
Total Protein: 6.9 g/dL (ref 6.5–8.1)

## 2022-06-02 LAB — LIPASE, BLOOD: Lipase: 59 U/L — ABNORMAL HIGH (ref 11–51)

## 2022-06-02 LAB — PHOSPHORUS: Phosphorus: 2.7 mg/dL (ref 2.5–4.6)

## 2022-06-02 LAB — MAGNESIUM: Magnesium: 2.3 mg/dL (ref 1.7–2.4)

## 2022-06-02 MED ORDER — POTASSIUM CHLORIDE 10 MEQ/100ML IV SOLN
10.0000 meq | INTRAVENOUS | Status: AC
  Administered 2022-06-02 (×6): 10 meq via INTRAVENOUS
  Filled 2022-06-02 (×6): qty 100

## 2022-06-02 MED ORDER — HYDRALAZINE HCL 20 MG/ML IJ SOLN
20.0000 mg | Freq: Four times a day (QID) | INTRAMUSCULAR | Status: DC
  Administered 2022-06-02 – 2022-06-15 (×40): 20 mg via INTRAVENOUS
  Filled 2022-06-02 (×47): qty 1

## 2022-06-02 MED ORDER — IOHEXOL 300 MG/ML  SOLN
75.0000 mL | Freq: Once | INTRAMUSCULAR | Status: AC | PRN
  Administered 2022-06-02: 75 mL via INTRAVENOUS

## 2022-06-02 NOTE — Progress Notes (Signed)
  Progress Note   Patient: Robin Arellano BUL:[id number] DOB: 03/05/1951 DOA: 05/26/2022     7 DOS: the patient was seen and examined on 06/02/2022   Brief hospital course: 71 year old woman recent diagnosis left PCA stroke with discharged to inpatient rehab 7/11, readmitted 7/18 to acute care for fever secondary to [place].  Plan for readmission to CIR when bed available, but now with nausea and vomiting and mild pancreatitis.  Assessment and Plan: COVID-19 positive 05/26/22, with associated n/v --Paxlovid stopped 7/18 due to rise in Cr  --Remains stable on room air --Supportive treatments as ordered, encourage IS, encourage prone positioning, encourage mobilization  --CTA chest negative for PE   Nausea, vomiting, acute pancreatitis. Had first episode 03/2020, etiology was unclear --lipase minimally elevated and down to 59, but more symptomatic today despite NPO with increased pain and ongoing vomiting (though hungry) --remain n.p.o., IV fluids, antiemetics, analgesics. Check lipid panel. --will check CT abd/pelvis today. Also reports h/o peptic ulcers in past --sees Dr. Ardis Hughs, will ask GI to see here based on CT results   Acute metabolic encephalopathy --multifactorial, consistent with delirium by description, likely secondary to recent stroke, COVID, UTI -- Resolved   UTI --completed treatment   Left PCA stroke, CAD --Plavix, aspirin, Lipitor --PT, OT  Essential Hypertension --poor control. Continue Norvasc, Catapres; will increase hydralazine   AKI on CKD stage IIIb --Baseline creatinine 1.9 --AKI resolved   Diarrhea --Likely in setting of COVID.     Subjective:  Feels miserable Retching, spiting up phelgm frequently Now has epigastric pain radiating to back  Physical Exam: Vitals:   06/01/22 2244 06/02/22 0518 06/02/22 0944 06/02/22 1156  BP: (!) 178/85 (!) 187/84 (!) 201/84 (!) 181/65  Pulse:  81 77 78  Resp:  19 18   Temp:  98.2 F (36.8 C) 98.1 F (36.7  C)   TempSrc:  Oral Oral   SpO2:  96% 97%   Weight:      Height:       Physical Exam Vitals reviewed.  Constitutional:      General: She is not in acute distress.    Appearance: She is ill-appearing. She is not toxic-appearing.  Cardiovascular:     Rate and Rhythm: Normal rate and regular rhythm.     Heart sounds: No murmur heard.    Comments: Telemetry SR Pulmonary:     Effort: Pulmonary effort is normal. No respiratory distress.     Breath sounds: No wheezing, rhonchi or rales.  Abdominal:     General: There is no distension.     Palpations: Abdomen is soft.     Tenderness: There is abdominal tenderness (epigastric > lower abdomen). There is no rebound.  Musculoskeletal:     Right lower leg: No edema.     Left lower leg: No edema.  Neurological:     Mental Status: She is alert.  Psychiatric:        Mood and Affect: Mood normal.        Behavior: Behavior normal.     Data Reviewed:  K+ 3.1 Creatinine down to 1.35 Lipase down to 59  Family Communication: sister at bedside  Disposition: Status is: Inpatient Remains inpatient appropriate because: vomiting, NPO, pancreatitis  Planned Discharge Destination:  C[facility]    Time spent: 35 minutes  Author: Murray Hodgkins, MD 06/02/2022 1:44 PM  For on call review [email or web address].

## 2022-06-03 DIAGNOSIS — G8929 Other chronic pain: Secondary | ICD-10-CM

## 2022-06-03 DIAGNOSIS — M5442 Lumbago with sciatica, left side: Secondary | ICD-10-CM

## 2022-06-03 LAB — COMPREHENSIVE METABOLIC PANEL
ALT: 20 U/L (ref 0–44)
AST: 22 U/L (ref 15–41)
Albumin: 2.8 g/dL — ABNORMAL LOW (ref 3.5–5.0)
Alkaline Phosphatase: 86 U/L (ref 38–126)
Anion gap: 9 (ref 5–15)
BUN: 8 mg/dL (ref 8–23)
CO2: 17 mmol/L — ABNORMAL LOW (ref 22–32)
Calcium: 8.6 mg/dL — ABNORMAL LOW (ref 8.9–10.3)
Chloride: 111 mmol/L (ref 98–111)
Creatinine, Ser: 1.17 mg/dL — ABNORMAL HIGH (ref 0.44–1.00)
GFR, Estimated: 50 mL/min — ABNORMAL LOW (ref >60)
Glucose, Bld: 83 mg/dL (ref 70–99)
Potassium: 3.4 mmol/L — ABNORMAL LOW (ref 3.5–5.1)
Sodium: 137 mmol/L (ref 135–145)
Total Bilirubin: 0.5 mg/dL (ref 0.3–1.2)
Total Protein: 6 g/dL — ABNORMAL LOW (ref 6.5–8.1)

## 2022-06-03 LAB — CBC
HCT: 26.1 % — ABNORMAL LOW (ref 36.0–46.0)
Hemoglobin: 8.7 g/dL — ABNORMAL LOW (ref 12.0–15.0)
MCH: 31.9 pg (ref 26.0–34.0)
MCHC: 33.3 g/dL (ref 30.0–36.0)
MCV: 95.6 fL (ref 80.0–100.0)
Platelets: 208 10*3/uL (ref 150–400)
RBC: 2.73 MIL/uL — ABNORMAL LOW (ref 3.87–5.11)
RDW: 14.1 % (ref 11.5–15.5)
WBC: 7.3 10*3/uL (ref 4.0–10.5)
nRBC: 0 % (ref 0.0–0.2)

## 2022-06-03 LAB — LIPID PANEL
Cholesterol: 167 mg/dL (ref 0–200)
HDL: 38 mg/dL — ABNORMAL LOW (ref >40)
LDL Cholesterol: 75 mg/dL (ref 0–99)
Total CHOL/HDL Ratio: 4.4 RATIO
Triglycerides: 271 mg/dL — ABNORMAL HIGH (ref <150)
VLDL: 54 mg/dL — ABNORMAL HIGH (ref 0–40)

## 2022-06-03 LAB — LIPASE, BLOOD: Lipase: 72 U/L — ABNORMAL HIGH (ref 11–51)

## 2022-06-03 MED ORDER — HYDROMORPHONE HCL 1 MG/ML IJ SOLN
0.5000 mg | INTRAMUSCULAR | Status: DC | PRN
  Administered 2022-06-03 – 2022-06-04 (×5): 0.5 mg via INTRAVENOUS
  Filled 2022-06-03 (×4): qty 1

## 2022-06-03 MED ORDER — CHLORTHALIDONE 25 MG PO TABS
25.0000 mg | ORAL_TABLET | Freq: Every day | ORAL | Status: DC
  Administered 2022-06-03 – 2022-06-15 (×12): 25 mg via ORAL
  Filled 2022-06-03 (×13): qty 1

## 2022-06-03 MED ORDER — ADULT MULTIVITAMIN W/MINERALS CH
1.0000 | ORAL_TABLET | Freq: Every day | ORAL | Status: DC
  Administered 2022-06-03 – 2022-06-14 (×6): 1 via ORAL
  Filled 2022-06-03 (×11): qty 1

## 2022-06-03 MED ORDER — SODIUM CHLORIDE 0.9 % IV SOLN
12.5000 mg | Freq: Four times a day (QID) | INTRAVENOUS | Status: DC | PRN
  Administered 2022-06-03 – 2022-06-07 (×6): 12.5 mg via INTRAVENOUS
  Filled 2022-06-03: qty 12.5
  Filled 2022-06-03 (×3): qty 0.5
  Filled 2022-06-03: qty 12.5
  Filled 2022-06-03 (×2): qty 0.5
  Filled 2022-06-03: qty 12.5

## 2022-06-03 MED ORDER — CLONIDINE HCL 0.1 MG PO TABS
0.1000 mg | ORAL_TABLET | Freq: Two times a day (BID) | ORAL | Status: DC
  Administered 2022-06-04 – 2022-06-15 (×23): 0.1 mg via ORAL
  Filled 2022-06-03 (×25): qty 1

## 2022-06-03 MED ORDER — OXYCODONE HCL 5 MG PO TABS
5.0000 mg | ORAL_TABLET | ORAL | Status: DC | PRN
  Administered 2022-06-04 – 2022-06-08 (×18): 10 mg via ORAL
  Administered 2022-06-09: 5 mg via ORAL
  Administered 2022-06-09 – 2022-06-15 (×27): 10 mg via ORAL
  Filled 2022-06-03 (×15): qty 2
  Filled 2022-06-03: qty 1
  Filled 2022-06-03 (×31): qty 2

## 2022-06-03 MED ORDER — TRAZODONE HCL 50 MG PO TABS
50.0000 mg | ORAL_TABLET | Freq: Every evening | ORAL | Status: DC | PRN
  Administered 2022-06-06 – 2022-06-09 (×3): 50 mg via ORAL
  Filled 2022-06-03 (×5): qty 1

## 2022-06-03 MED ORDER — PHENOL 1.4 % MT LIQD
1.0000 | OROMUCOSAL | Status: DC | PRN
Start: 2022-06-03 — End: 2022-06-16
  Administered 2022-06-04: 1 via OROMUCOSAL
  Filled 2022-06-03: qty 177

## 2022-06-03 NOTE — Progress Notes (Signed)
Initial Nutrition Assessment  DOCUMENTATION CODES:   Not applicable  INTERVENTION:  Encourage adequate PO intake MVI with minerals daily Request updated weight  NUTRITION DIAGNOSIS:   Inadequate oral intake related to acute illness as evidenced by per patient/family report  GOAL:   Patient will meet greater than or equal to 90% of their needs  MONITOR:   Diet advancement, Labs, Weight trends  REASON FOR ASSESSMENT:   Malnutrition Screening Tool    ASSESSMENT:   Pt admitted 7/3 d/t L PCA stroke, was discharged to CIR on 7/11 and then readmitted 7/18 to acute care for fever secondary to [place]. Now with nausea, vomiting and mild pancreatitis. Plans to readmit to CIR once stable and bed available. PMH significant for fibromyalgia, chronic low back pain s/p L4-L5 posterior lumbar fusion (6/15), CAD, HLD, CKD stage IIIb, penetrating atherosclerotic ulcer of descending aorta (7/23).  Spoke with pt's sister, Robin Arellano, via phone call to room. She states that during rehab her meal intake continued to be very poor. They were bringing outside food per prior RD recommendation, however she had a poor appetite and did not eat much. She has been experiencing nausea and vomiting with abdominal pain, which she reports is slightly improved today.   Noted her diet was just advanced today. They are encouraging he to eat to avoid the need for tube feeding. Ordered lunch for pt including chicken noodle soup, grilled cheese, juice, ice cream and a banana. Encouraged pt to start with more bland, low fiber foods that may be more gentle and easier to digest for today.   Per prior RD assessment, pt does not like Ensure or Boost products. Recommended she try [facility], however she declines for today and will try to increase PO intake.   Reviewed wt history. Pt's wt appears to have been declining since November 2022. Last documented wt on file is from 7/15 at 64.6 kg. Will request updated wt to review.    Medications: Vitamin C '500mg'$  daily, Vitamin D3 2000 units daily, pepcid, zinc 220 mg daily IV drips: NaCl @ 151m/hr  Labs: potassium 3.4, Cr 1.17, lipase 72, GFR 50, HDL 38, triglycerides 271, VLDL 54  NUTRITION - FOCUSED PHYSICAL EXAM: RD working remotely. Deferred to follow up.   Diet Order:   Diet Order             Diet Heart Room service appropriate? Yes; Fluid consistency: Thin  Diet effective now                   EDUCATION NEEDS:   No education needs have been identified at this time  Skin:  Skin Assessment: Skin Integrity Issues: Skin Integrity Issues:: Stage II Stage II: L buttock  Last BM:  7/21 (type 1)  Height:   Ht Readings from Last 1 Encounters:  05/26/22 '5\' 3"'$  (1.6 m)    Weight:   Wt Readings from Last 1 Encounters:  05/26/22 64.6 kg   BMI:  Body mass index is 25.23 kg/m.  Estimated Nutritional Needs:   Kcal:  1700-1900  Protein:  90-105g  Fluid:  >/=1.7L  AClayborne Dana RDN, LDN Clinical Nutrition

## 2022-06-03 NOTE — Plan of Care (Signed)
  Problem: Activity: Goal: Risk for activity intolerance will decrease Outcome: Not Progressing   Problem: Nutrition: Goal: Adequate nutrition will be maintained Outcome: Not Progressing   Problem: Coping: Goal: Level of anxiety will decrease Outcome: Not Progressing   Problem: Pain Managment: Goal: General experience of comfort will improve Outcome: Not Progressing   Problem: Clinical Measurements: Goal: Respiratory complications will improve Outcome: Progressing Goal: Cardiovascular complication will be avoided Outcome: Progressing   Problem: Elimination: Goal: Will not experience complications related to urinary retention Outcome: Progressing

## 2022-06-03 NOTE — Progress Notes (Signed)
  Progress Note   Patient: Robin Arellano AQT:[id number] DOB: May 13, 1951 DOA: 05/26/2022     8 DOS: the patient was seen and examined on 06/03/2022   Brief hospital course: 71 year old woman recent diagnosis left PCA stroke with discharged to inpatient rehab 7/11, readmitted 7/18 to acute care for fever secondary to [place].  Plan for readmission to CIR when bed available; has had poorly controlled back pain, nausea and retching and poorly controlled HTN.  Assessment and Plan: COVID-19 positive 05/26/22, with associated n/v; isolation up in 2 days --Paxlovid stopped 7/18 due to rise in Cr  --Remains stable on room air --Supportive treatments as ordered, encourage IS, encourage prone positioning, encourage mobilization  --CTA chest negative for PE   Nausea, vomiting. Acute pancreatitis considered based on elevated lipase and symptoms, but now ruled out with CT. --CT abd/pelvis no acute features --symptoms better today, will advance diet and monitor  Essential Hypertension --poor control. Continue Norvasc, hydralazine, will increase Catapres, resume chlorthalidone. Treat pain. ' Acute on chronic pain secondary to spondylolisthesis L4-L5 with lumbar radiculopathy --oxycodone   Acute metabolic encephalopathy --multifactorial, consistent with delirium by description, likely secondary to recent stroke, COVID, UTI -- Resolved   UTI --completed treatment   Left PCA stroke, CAD --Plavix, aspirin, Lipitor --PT, OT   AKI on CKD stage IIIb --Baseline creatinine 1.9 --AKI resolved   Diarrhea --Likely in setting of COVID.      Subjective:  Feels a little better Still has some nausea but wants to try some food Back pain is poorly controlled Wondering about hypertension  Physical Exam: Vitals:   06/02/22 0944 06/02/22 1156 06/02/22 1643 06/02/22 2238  BP: (!) 201/84 (!) 181/65 (!) 190/70 (!) 158/74  Pulse: 77 78 83 86  Resp: 18   18  Temp: 98.1 F (36.7 C)  98.4 F (36.9  C)   TempSrc: Oral  Oral   SpO2: 97%  100% 99%  Weight:      Height:       Physical Exam Vitals reviewed.  Constitutional:      General: She is not in acute distress.    Appearance: She is not ill-appearing or toxic-appearing.     Comments: Appears more comfortable today  Cardiovascular:     Rate and Rhythm: Normal rate and regular rhythm.     Heart sounds: No murmur heard. Pulmonary:     Effort: Pulmonary effort is normal. No respiratory distress.     Breath sounds: No wheezing, rhonchi or rales.  Abdominal:     Palpations: Abdomen is soft.     Tenderness: There is abdominal tenderness (generalized).  Neurological:     Mental Status: She is alert.  Psychiatric:        Mood and Affect: Mood normal.        Behavior: Behavior normal.     Data Reviewed:  K+ 3.4 Creatinine down to 1.17 Lipase 72 Hgb slightly lower 8.7  Family Communication: 2 sisters at bedside  Disposition: Status is: Inpatient Remains inpatient appropriate because: nausea, poor appetite, needs CIR  Planned Discharge Destination: Rehab    Time spent: 20 minutes  Author: Murray Hodgkins, MD 06/03/2022 1:50 PM  For on call review [email or web address].

## 2022-06-04 ENCOUNTER — Other Ambulatory Visit: Payer: Self-pay

## 2022-06-04 MED ORDER — PROCHLORPERAZINE EDISYLATE 10 MG/2ML IJ SOLN
5.0000 mg | Freq: Once | INTRAMUSCULAR | Status: AC
  Administered 2022-06-04: 5 mg via INTRAVENOUS
  Filled 2022-06-04: qty 2

## 2022-06-04 MED ORDER — MORPHINE SULFATE (PF) 2 MG/ML IV SOLN
2.0000 mg | INTRAVENOUS | Status: DC | PRN
  Administered 2022-06-11: 2 mg via INTRAVENOUS
  Filled 2022-06-04 (×2): qty 1

## 2022-06-04 MED ORDER — PANTOPRAZOLE SODIUM 40 MG IV SOLR
40.0000 mg | Freq: Two times a day (BID) | INTRAVENOUS | Status: DC
  Administered 2022-06-04 – 2022-06-07 (×7): 40 mg via INTRAVENOUS
  Filled 2022-06-04 (×7): qty 10

## 2022-06-04 MED ORDER — ONDANSETRON HCL 4 MG/2ML IJ SOLN
4.0000 mg | Freq: Four times a day (QID) | INTRAMUSCULAR | Status: DC
  Administered 2022-06-04: 4 mg via INTRAVENOUS
  Filled 2022-06-04: qty 2

## 2022-06-04 NOTE — Progress Notes (Signed)
PT Cancellation Note  Patient Details Name: Robin Arellano MRN: [id number] DOB: 1951/04/03   Cancelled Treatment:    Reason Eval/Treat Not Completed: Medical issues which prohibited therapy  Patient running a fever, feels achey and nauseated. Will reattempt 7/25.    [place]  Office [phone]  Rexanne Mano 06/04/2022, 3:00 PM

## 2022-06-04 NOTE — Progress Notes (Signed)
Inpatient Rehab Admissions Coordinator:   CIR continues to hold all admissions for today. I do not have a bed for this Pt. Today. Update provide to Pt. Family. They would still like to pursue CIR once a bed becomes available.   Clemens Catholic, [place], [place] Admissions Coordinator  [phone] ([place]) [phone] (office)

## 2022-06-04 NOTE — TOC Progression Note (Signed)
Transition of Care [facility]) - Progression Note    Patient Details  Name: Robin Arellano MRN: [id number] Date of Birth: November 06, 1951  Transition of Care [facility]) CM/SW Contact  Tom-Johnson, Renea Ee, RN Phone Number: 06/04/2022, 1:40 PM  Clinical Narrative:     CIR following and has no bed available today. Patient and family still interested in [place].  CM will continue to follow with needs.        Expected Discharge Plan and Services                                                 Social Determinants of Health (SDOH) Interventions    Readmission Risk Interventions     No data to display

## 2022-06-04 NOTE — Plan of Care (Signed)
  Problem: Respiratory: Goal: Complications related to the disease process, condition or treatment will be avoided or minimized Outcome: Progressing   

## 2022-06-04 NOTE — Progress Notes (Signed)
OT Cancellation Note  Patient Details Name: Robin Arellano MRN: [id number] DOB: 05-05-1951   Cancelled Treatment:    Reason Eval/Treat Not Completed: Pain limiting ability to participate pt reporting back pain, declining OT session. Will check back as time allows for OT intervention.   Harley Alto., COTA/L Acute Rehabilitation Services [phone]   Precious Haws 06/04/2022, 10:37 AM

## 2022-06-04 NOTE — Progress Notes (Signed)
  Progress Note   Patient: Robin Arellano LYY:[id number] DOB: [birth year] DOA: 05/26/2022     9 DOS: the patient was seen and examined on 06/04/2022   Brief hospital course: 71 year old woman recent diagnosis left PCA stroke with discharged to inpatient rehab 7/11, readmitted 7/18 to acute care for fever secondary to [place].  Plan for readmission to CIR when bed available; has had poorly controlled back pain, nausea and retching and poorly controlled HTN.  Assessment and Plan: COVID-19 positive 05/26/22, with associated n/v; isolation up 7/25 --Paxlovid stopped 7/18 due to rise in Cr  --Remains stable on room air --Supportive treatments as ordered, encourage IS, encourage prone positioning, encourage mobilization  --CTA chest negative for PE   Nausea, vomiting. Acute pancreatitis considered based on elevated lipase and symptoms, but now ruled out with CT. --CT abd/pelvis no acute features --symptoms wax and wane, etiology unclear; change Dilaudid to morphine, schedule Zofran. Exam benign   Essential Hypertension --labile but better overall. Will continue Norvasc, hydralazine, will increase Catapres, resume chlorthalidone. Treat pain. ' Acute on chronic pain secondary to spondylolisthesis L4-L5 with lumbar radiculopathy --oxycodone   Acute metabolic encephalopathy --multifactorial, consistent with delirium by description, likely secondary to recent stroke, COVID, UTI -- Resolved   UTI --completed treatment   Left PCA stroke, CAD --Plavix, aspirin, Lipitor --PT, OT   AKI on CKD stage IIIb --Baseline creatinine 1.9 --AKI resolved   Diarrhea --Likely in setting of COVID.      Subjective:  Feels about the same, has a headache, still nauseous, has abdominal wall pain Minimal oral intake  Physical Exam: Vitals:   06/03/22 2012 06/04/22 0320 06/04/22 0411 06/04/22 1405  BP: (!) 170/82  (!) 185/81 (!) 157/83  Pulse: 97  90   Resp: 18  16   Temp: 98.7 F (37.1 C)  98.5  F (36.9 C)   TempSrc: Oral  Oral   SpO2: 98%  97%   Weight:  63.5 kg    Height:       Physical Exam Vitals reviewed.  Constitutional:      General: She is not in acute distress.    Appearance: She is not ill-appearing or toxic-appearing.  Cardiovascular:     Rate and Rhythm: Normal rate and regular rhythm.     Heart sounds: No murmur heard. Pulmonary:     Effort: Pulmonary effort is normal. No respiratory distress.     Breath sounds: No wheezing, rhonchi or rales.  Abdominal:     General: There is no distension.     Palpations: Abdomen is soft.     Tenderness: There is abdominal tenderness (abdominal wall).     Comments: Extensive ecchymosis seen over abdomen  Neurological:     Mental Status: She is alert.  Psychiatric:        Mood and Affect: Mood normal.        Behavior: Behavior normal.     Data Reviewed:  There are no new results to review at this time.  Family Communication: 2 sisters at bedside  Disposition: Status is: Inpatient Remains inpatient appropriate because: awaiting CIR, nausea  Planned Discharge Destination:  CIR    Time spent: 35 minutes  Author: Murray Hodgkins, MD 06/04/2022 5:41 PM  For on call review [email or web address].

## 2022-06-04 NOTE — Progress Notes (Signed)
Patient refused PO meds, stating she cannot swallow pills. Patient eating regular foods and prefer IV meds.

## 2022-06-05 LAB — COMPREHENSIVE METABOLIC PANEL
ALT: 15 U/L (ref 0–44)
AST: 20 U/L (ref 15–41)
Albumin: 2.5 g/dL — ABNORMAL LOW (ref 3.5–5.0)
Alkaline Phosphatase: 83 U/L (ref 38–126)
Anion gap: 11 (ref 5–15)
BUN: 7 mg/dL — ABNORMAL LOW (ref 8–23)
CO2: 20 mmol/L — ABNORMAL LOW (ref 22–32)
Calcium: 8.3 mg/dL — ABNORMAL LOW (ref 8.9–10.3)
Chloride: 105 mmol/L (ref 98–111)
Creatinine, Ser: 1.22 mg/dL — ABNORMAL HIGH (ref 0.44–1.00)
GFR, Estimated: 47 mL/min — ABNORMAL LOW (ref >60)
Glucose, Bld: 93 mg/dL (ref 70–99)
Potassium: 3 mmol/L — ABNORMAL LOW (ref 3.5–5.1)
Sodium: 136 mmol/L (ref 135–145)
Total Bilirubin: 0.4 mg/dL (ref 0.3–1.2)
Total Protein: 5.9 g/dL — ABNORMAL LOW (ref 6.5–8.1)

## 2022-06-05 LAB — MAGNESIUM: Magnesium: 1.3 mg/dL — ABNORMAL LOW (ref 1.7–2.4)

## 2022-06-05 LAB — PHOSPHORUS: Phosphorus: 3.4 mg/dL (ref 2.5–4.6)

## 2022-06-05 MED ORDER — MAGNESIUM SULFATE 4 GM/100ML IV SOLN
4.0000 g | Freq: Once | INTRAVENOUS | Status: AC
  Administered 2022-06-05: 4 g via INTRAVENOUS
  Filled 2022-06-05: qty 100

## 2022-06-05 MED ORDER — POTASSIUM CHLORIDE 10 MEQ/100ML IV SOLN
INTRAVENOUS | Status: AC
  Filled 2022-06-05: qty 100

## 2022-06-05 MED ORDER — POTASSIUM CHLORIDE 10 MEQ/100ML IV SOLN
10.0000 meq | INTRAVENOUS | Status: AC
  Administered 2022-06-05 (×4): 10 meq via INTRAVENOUS
  Filled 2022-06-05 (×3): qty 100

## 2022-06-05 NOTE — Evaluation (Signed)
Speech Language Pathology Evaluation Patient Details Name: Robin Arellano MRN: [id number] DOB: 1951-07-21 Today's Date: 06/05/2022 Time: 1540-0867 SLP Time Calculation (min) (ACUTE ONLY): 18 min  Problem List:  Patient Active Problem List   Diagnosis Date Noted   Nausea & vomiting 06/01/2022   Chronic kidney disease, stage 3b ([place]) 05/27/2022   Mixed hyperlipidemia 05/27/2022   Pressure injury of skin 05/27/2022   COVID-19 virus infection 05/26/2022   Hyponatremia    Acute left PCA stroke ([place]) 05/22/2022   Hypertensive emergency    Pseudoaneurysm ([place]) 05/14/2022   Spondylolisthesis at L4-L5 level 04/26/2022   Hyperlipidemia 11/25/2020   Essential hypertension 07/11/2020   Common bile duct dilation    Acute pancreatitis 04/01/2020   Anxiety    Hypokalemia    Mild renal insufficiency    Elevated troponin    Hypertensive urgency    Elevated liver enzymes    Abnormal magnetic resonance imaging of abdomen    Coronary artery disease involving native coronary artery of native heart without angina pectoris    Chest pain    Herpes zoster without complication 61/95/0932   Abdominal pain 02/07/2018   Abdominal pain, epigastric 02/07/2018   Low back pain 07/09/2013   Neck pain 07/09/2013   Headache(784.0) 07/09/2013   Fibromyalgia 07/09/2013   Past Medical History:  Past Medical History:  Diagnosis Date   Anxiety    on meds   Back pain    Chronic female pelvic pain    Coronary artery disease    mild, non-obstructive 11/2018   Depression    on meds   Family history of adverse reaction to anesthesia    sister had difficulty waking up   Fibromyalgia    H/O leukocytosis    Headache    Hyperlipidemia    on meds   Hypertension    on meds   MI (myocardial infarction) ([place])    Pt states she did not have a MI- EKG was normal, was GERD   Osteoarthritis    on meds   Ovarian cyst, right    PONV (postoperative nausea and vomiting)    Post-operative nausea and vomiting     SVD (spontaneous vaginal delivery)    x 2   Vitamin D deficiency    Past Surgical History:  Past Surgical History:  Procedure Laterality Date   ABDOMINAL HYSTERECTOMY  1994   TAH.BSO   ANTERIOR CERVICAL DECOMP/DISCECTOMY FUSION  2019   APPENDECTOMY  1975   BACK SURGERY  2023   BIOPSY  05/12/2020   Procedure: BIOPSY;  Surgeon: Milus Banister, MD;  Location: [facility];  Service: Endoscopy;;   CARDIAC CATHETERIZATION  2020   CHOLECYSTECTOMY N/A 07/21/2020   Procedure: LAPAROSCOPIC CHOLECYSTECTOMY WITH INTRAOPERATIVE CHOLANGIOGRAM;  Surgeon: [name], MD;  Location: [facility];  Service: General;  Laterality: N/A;   COLONOSCOPY  08/12/2017   Hx TA (piecemeal)Jacobs-MAC-suprep (good)   ESOPHAGOGASTRODUODENOSCOPY (EGD) WITH PROPOFOL N/A 05/12/2020   Procedure: ESOPHAGOGASTRODUODENOSCOPY (EGD) WITH PROPOFOL;  Surgeon: Milus Banister, MD;  Location: [facility];  Service: Endoscopy;  Laterality: N/A;   EUS N/A 05/12/2020   Procedure: UPPER ENDOSCOPIC ULTRASOUND (EUS) RADIAL;  Surgeon: Milus Banister, MD;  Location: [facility];  Service: Endoscopy;  Laterality: N/A;   KNEE SURGERY Bilateral 1996   x 2 - arthroscopic   LEFT HEART CATH AND CORONARY ANGIOGRAPHY N/A 11/25/2018   Procedure: LEFT HEART CATH AND CORONARY ANGIOGRAPHY;  Surgeon: Troy Sine, MD;  Location: [facility];  Service: Cardiovascular;  Laterality: N/A;   PELVIC LAPAROSCOPY  1989   W LYSIS OF ADHESIONS/L SALPINGONEOSTOMY   TUBAL LIGATION     WISDOM TOOTH EXTRACTION     HPI:  71 year old female with recent diagnosis of left PCA stroke with eventual discharge inpatient rehab on 7/11 presented back to the medical service due to new onset flulike symptoms, fever of 102.5 F and COVID PCR positivity; with past medical history of fibromyalgia, chronic low back pain status post L4-L5 posterior lumbar fusion 6/15, coronary artery disease (S/P cath 11/2018 mild nonobstructive CAD), hyperlipidemia, chronic  kidney disease stage IIIb (baseline Cr 1.6-1.7), 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 Areas of difficulty were clock-drawing (deficits in chronological sequencing and spacing of numbers; inaccurate hour/minute hand drawing), word-retrieval, paragraph recall, and attention/concentration. Expressive and receptive language were WNL.  Pt will benefit from ongoing SLP in CIR to address cognition.    SLP Assessment  SLP Recommendation/Assessment: Patient needs continued Speech [place] Pathology Services SLP Visit Diagnosis: Cognitive communication deficit (R41.841)    Recommendations for follow up therapy are one component of a multi-disciplinary discharge planning process, led by the attending physician.  Recommendations may be updated based on patient status, additional functional criteria and insurance authorization.    Follow Up Recommendations  Acute inpatient rehab (3hours/day)    Assistance Recommended at Discharge  Frequent or constant Supervision/Assistance  Functional Status Assessment Patient has had a recent decline in their functional status and demonstrates the ability to make significant improvements in function in a reasonable and predictable amount of time.  Frequency and Duration min 1 x/week  2 weeks      SLP Evaluation Cognition  Overall Cognitive Status: Impaired/Different from baseline Arousal/Alertness: Awake/alert Orientation Level: Oriented to person;Oriented to place;Disoriented to time Attention: Selective Selective Attention: Impaired Memory: Impaired Memory Impairment: Storage deficit;Retrieval deficit Awareness: Impaired Awareness Impairment: Emergent impairment Problem Solving: Impaired Problem  Solving Impairment: Functional basic;Verbal basic Safety/Judgment: Impaired       Comprehension  Auditory Comprehension Overall Auditory Comprehension: Appears within functional limits for tasks assessed    Expression Expression Primary Mode of Expression: Verbal Verbal Expression Overall Verbal Expression: Appears within functional limits for tasks assessed Written Expression Dominant Hand: Right   Oral / Motor  Oral Motor/Sensory Function Overall Oral Motor/Sensory Function: Within functional limits Motor Speech Overall Motor Speech: Appears within functional limits for tasks assessed            Juan Quam Laurice 06/05/2022, 4:11 PM Aleyza Salmi L. Tivis Ringer, MA CCC/SLP Clinical Specialist - [place] Office number [phone]

## 2022-06-05 NOTE — Progress Notes (Signed)
Physical Therapy Treatment Patient Details Name: Robin Arellano MRN: [id number] DOB: 1951-06-12 Today's Date: 06/05/2022   History of Present Illness 71 year old female with recent diagnosis of left PCA stroke with eventual discharge inpatient rehab on 7/11 presented back to the medical service due to new onset flulike symptoms, fever of 102.5 F and COVID PCR positivity; with past medical history of fibromyalgia, chronic low back pain status post L4-L5 posterior lumbar fusion 6/15, coronary artery disease (S/P cath 11/2018 mild nonobstructive CAD), hyperlipidemia, chronic kidney disease stage IIIb (baseline Cr 1.6-1.7), 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 Pre-medicated for back pain, however pt then reported feeling very woozy with mobility and did not feel she could advance her ambulation beyond 30 ft with RW and min assist for maneuvering RW and to steady pt. Patient continues to need multi-modal cues for adhering to back precautions--has demonstrated poor memory for these, ?due to feeling so poorly with limited mobility recently. Continue to feel she can benefit from intensity of AIR as she needs repetition to learn to protect her back and learn to safely use RW.     Recommendations for follow up therapy are one component of a multi-disciplinary discharge planning process, led by the attending physician.  Recommendations may be updated based on patient status, additional functional criteria and insurance authorization.  Follow Up Recommendations  Acute inpatient rehab (3hours/day)     Assistance Recommended at Discharge Frequent or constant Supervision/Assistance  Patient can return home with the following A little help with walking and/or transfers;Assistance with cooking/housework;Direct supervision/assist for medications management;Direct  supervision/assist for financial management;Assist for transportation;Help with stairs or ramp for entrance;A lot of help with bathing/dressing/bathroom   Equipment Recommendations  Rolling walker (2 wheels);BSC/3in1    Recommendations for Other Services       Precautions / Restrictions Precautions Precautions: Back;Fall (spine surgery 04/26/22) Precaution Booklet Issued: No Precaution Comments: pt states 1 of 3 back precautions; as PT demonstrates what not to do, she is able to name them Required Braces or Orthoses: Spinal Brace Spinal Brace: Lumbar corset;Applied in sitting position Spinal Brace Comments: LSO in room Restrictions Weight Bearing Restrictions: No Other Position/Activity Restrictions: Brace in room - LSO     Mobility  Bed Mobility Overal bed mobility: Needs Assistance Bed Mobility: Rolling, Sidelying to Sit, Sit to Sidelying Rolling: Min assist (with rail) Sidelying to sit: HOB elevated, Min assist (with rail)     Sit to sidelying: Min assist General bed mobility comments: multimodal cues for log roll technique; pt refuses to attempt with HOB completely flat and no rail    Transfers Overall transfer level: Needs assistance Equipment used: Rolling walker (2 wheels) Transfers: Sit to/from Stand Sit to Stand: Min assist           General transfer comment: vc for proper sequencing/technique with RW as pt tries to pull up on RW; steadying assist as she transitions hands from bed to RW    Ambulation/Gait Ambulation/Gait assistance: Min assist Gait Distance (Feet): 30 Feet Assistive device: Rolling walker (2 wheels) Gait Pattern/deviations: Decreased step length - right, Decreased step length - left, Shuffle, Step-to pattern Gait velocity: significantly impaired     General Gait Details: VERY short step length with incr time to cover 30 ft of ambulation; vc for incr step length with pt carrying over for 1-2 steps at  a time and reverts to very small,  shuffling steps; assist to maneuver RW in turning and when side-stepping through narrow passage   Stairs             Wheelchair Mobility    Modified Rankin (Stroke Patients Only) Modified Rankin (Stroke Patients Only) Pre-Morbid Rankin Score: Moderate disability Modified Rankin: Moderately severe disability     Balance Overall balance assessment: Needs assistance Sitting-balance support: Feet supported Sitting balance-Leahy Scale: Good Sitting balance - Comments: close supervision due to feeling woozy   Standing balance support: Bilateral upper extremity supported Standing balance-Leahy Scale: Poor Standing balance comment: requires at least one UE support                            Cognition Arousal/Alertness: Awake/alert Behavior During Therapy: WFL for tasks assessed/performed Overall Cognitive Status: Impaired/Different from baseline Area of Impairment: Memory, Following commands, Problem solving                     Memory: Decreased recall of precautions, Decreased short-term memory Following Commands: Follows one step commands with increased time (and multimodal cues)     Problem Solving: Requires verbal cues, Requires tactile cues, Difficulty sequencing, Slow processing General Comments: pt internally distracted by feeling "woozy" from pain medications        Exercises      General Comments General comments (skin integrity, edema, etc.): Discussed conundrum of pre-medicating for pain but then feels woozy and unsafe up on her feet. Patient reports next time she doesn't want pain medication before she tries to do therapy as she felt very unsafe on her feet today.      Pertinent Vitals/Pain Pain Assessment Pain Assessment: Faces Faces Pain Scale: Hurts a little bit Pain Location: low back Pain Descriptors / Indicators: Aching, Sore Pain Intervention(s): Limited activity within patient's tolerance, Monitored during session,  Premedicated before session    Home Living                          Prior Function            PT Goals (current goals can now be found in the care plan section) Acute Rehab PT Goals Patient Stated Goal: less body aches Time For Goal Achievement: 06/03/22 Potential to Achieve Goals: Good Progress towards PT goals: Progressing toward goals    Frequency    Min 3X/week      PT Plan Current plan remains appropriate    Co-evaluation              AM-PAC PT "6 Clicks" Mobility   Outcome Measure  Help needed turning from your back to your side while in a flat bed without using bedrails?: A Little Help needed moving from lying on your back to sitting on the side of a flat bed without using bedrails?: A Little Help needed moving to and from a bed to a chair (including a wheelchair)?: A Little Help needed standing up from a chair using your arms (e.g., wheelchair or bedside chair)?: A Little Help needed to walk in hospital room?: A Lot Help needed climbing 3-5 steps with a railing? : Total 6 Click Score: 15    End of Session Equipment Utilized During Treatment:  (pt refused use of brace) Activity Tolerance: Treatment limited secondary to medical complications (Comment) (feeling woozy; refused up in chair) Patient left: with call bell/phone within reach;in bed  Nurse Communication: Mobility status PT Visit Diagnosis: Unsteadiness on feet (R26.81);Muscle weakness (generalized) (M62.81);History of falling (Z91.81);Repeated falls (R29.6);Difficulty in walking, not elsewhere classified (R26.2);Dizziness and giddiness (R42)     Time: 7841-2820 PT Time Calculation (min) (ACUTE ONLY): 21 min  Charges:  $Gait Training: 8-22 mins                      Arby Barrette, PT Acute Rehabilitation Services  Office [phone]    Robin Arellano 06/05/2022, 10:34 AM

## 2022-06-05 NOTE — Progress Notes (Signed)
Occupational Therapy Treatment Patient Details Name: Robin Arellano MRN: [id number] DOB: 19-Mar-1951 Today's Date: 06/05/2022   History of present illness 71 year old female with recent diagnosis of left PCA stroke with eventual discharge inpatient rehab on 7/11 presented back to the medical service due to new onset flulike symptoms, fever of 102.5 F and COVID PCR positivity; with past medical history of fibromyalgia, chronic low back pain status post L4-L5 posterior lumbar fusion 6/15, coronary artery disease (S/P cath 11/2018 mild nonobstructive CAD), hyperlipidemia, chronic kidney disease stage IIIb (baseline Cr 1.6-1.7), 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 Pt able to cross foot over opposite knee, but not quite able to don socks. Transfers to [facility] with min assist. Completes grooming with min guard assist. Pt with report of mild nausea upon return to supine. Pt stating she is eager to complete rehab and return home. Continue to recommend AIR.   Recommendations for follow up therapy are one component of a multi-disciplinary discharge planning process, led by the attending physician.  Recommendations may be updated based on patient status, additional functional criteria and insurance authorization.    Follow Up Recommendations  Acute inpatient rehab (3hours/day)    Assistance Recommended at Discharge Frequent or constant Supervision/Assistance  Patient can return home with the following  A little help with walking and/or transfers;A little help with bathing/dressing/bathroom;Assistance with cooking/housework;Direct supervision/assist for medications management;Direct supervision/assist for financial management;Assist for transportation;Help with stairs or ramp for entrance   Equipment Recommendations  None recommended by OT    Recommendations for Other Services      Precautions /  Restrictions Precautions Precautions: Back;Fall (spine surgery 04/26/22) Precaution Booklet Issued: No Precaution Comments: pt unable to state back precautions Required Braces or Orthoses: Spinal Brace Spinal Brace: Lumbar corset;Applied in sitting position Restrictions Weight Bearing Restrictions: No       Mobility Bed Mobility Overal bed mobility: Needs Assistance Bed Mobility: Rolling, Sidelying to Sit, Sit to Sidelying Rolling: Min assist Sidelying to sit: Min guard     Sit to sidelying: Min assist General bed mobility comments: multimodal cues for log roll technique    Transfers Overall transfer level: Needs assistance Equipment used: Rollator (4 wheels), 1 person hand held assist Transfers: Sit to/from Stand, Bed to chair/wheelchair/BSC Sit to Stand: Min assist     Step pivot transfers: Min assist     General transfer comment: steadying assist     Balance Overall balance assessment: Needs assistance   Sitting balance-Leahy Scale: Good       Standing balance-Leahy Scale: Poor Standing balance comment: requires at least one UE support                           ADL either performed or assessed with clinical judgement   ADL Overall ADL's : Needs assistance/impaired Eating/Feeding: Independent;Bed level   Grooming: Min guard;Standing;Wash/dry hands           Upper Body Dressing : Minimal assistance;Sitting Upper Body Dressing Details (indicate cue type and reason): for brace Lower Body Dressing: Minimal assistance;Sit to/from stand   Toilet Transfer: Minimal assistance;Stand-pivot;BSC/3in1   Toileting- Clothing Manipulation and Hygiene: Set up;Sitting/lateral lean       Functional mobility during ADLs: Minimal assistance;Rolling walker (2 wheels)      Extremity/Trunk Assessment              Vision  Perception     Praxis      Cognition Arousal/Alertness: Awake/alert Behavior During Therapy: WFL for tasks  assessed/performed Overall Cognitive Status: Impaired/Different from baseline Area of Impairment: Memory, Following commands                     Memory: Decreased recall of precautions, Decreased short-term memory Following Commands: Follows one step commands with increased time (and multimodal cues)                Exercises      Shoulder Instructions       General Comments      Pertinent Vitals/ Pain       Pain Assessment Pain Assessment: Faces Faces Pain Scale: No hurt  Home Living                                          Prior Functioning/Environment              Frequency  Min 2X/week        Progress Toward Goals  OT Goals(current goals can now be found in the care plan section)  Progress towards OT goals: Progressing toward goals  Acute Rehab OT Goals OT Goal Formulation: With patient Time For Goal Achievement: 06/14/22 Potential to Achieve Goals: Good ADL Goals Pt Will Perform Grooming: Independently;standing Pt Will Perform Upper Body Bathing: with modified independence;sitting Pt Will Perform Lower Body Bathing: with modified independence;sit to/from stand Pt Will Perform Upper Body Dressing: Independently;sitting Pt Will Perform Lower Body Dressing: with modified independence;sit to/from stand Pt Will Transfer to Toilet: with modified independence;ambulating;bedside commode Pt Will Perform Toileting - Clothing Manipulation and hygiene: with modified independence;sit to/from stand Additional ADL Goal #1: Pt will be Mod I in and OOB for basic ADLs  Plan Discharge plan remains appropriate    Co-evaluation                 AM-PAC OT "6 Clicks" Daily Activity     Outcome Measure   Help from another person eating meals?: None Help from another person taking care of personal grooming?: A Little Help from another person toileting, which includes using toliet, bedpan, or urinal?: A Little Help from another person  bathing (including washing, rinsing, drying)?: A Little Help from another person to put on and taking off regular upper body clothing?: A Little Help from another person to put on and taking off regular lower body clothing?: A Little 6 Click Score: 19    End of Session Equipment Utilized During Treatment: Rolling walker (2 wheels);Gait belt  OT Visit Diagnosis: Unsteadiness on feet (R26.81);Other abnormalities of gait and mobility (R26.89);Muscle weakness (generalized) (M62.81);Pain;History of falling (Z91.81);Other symptoms and signs involving cognitive function   Activity Tolerance Patient tolerated treatment well   Patient Left in bed;with call bell/phone within reach;with bed alarm set   Nurse Communication          Time: [id number] OT Time Calculation (min): 21 min  Charges: OT General Charges $OT Visit: 1 Visit OT Treatments $Self Care/Home Management : 8-22 mins  Cleta Alberts, OTR/L Acute Rehabilitation Services Office: [phone]   Malka So 06/05/2022, 9:19 AM

## 2022-06-05 NOTE — Progress Notes (Signed)
Inpatient Rehab Admissions Coordinator:   I do not have insurance auth for CIR yet. I will submit case today.   Clemens Catholic, [place], [place] Admissions Coordinator  [phone] ([place]) [phone] (office)

## 2022-06-05 NOTE — Progress Notes (Signed)
  Progress Note   Patient: Robin Arellano GDJ:[id number] DOB: 11-Nov-1951 DOA: 05/26/2022     10 DOS: the patient was seen and examined on 06/05/2022   Brief hospital course: 71 year old woman recent diagnosis left PCA stroke with discharged to inpatient rehab 7/11, readmitted 7/18 to acute care for fever secondary to [place].  Plan for readmission to CIR when bed available.  Assessment and Plan: COVID-19 positive 05/26/22, with associated n/v; isolation up 7/25 --Paxlovid stopped 7/18 due to rise in Cr  --Remains stable on room air --Supportive treatments as ordered, encourage IS, encourage prone positioning, encourage mobilization  --CTA chest negative for PE --Dominant symptom was nausea and vomiting. --should be able to take off isolation tomorrow   Nausea, vomiting. Acute pancreatitis considered based on elevated lipase and symptoms, but now ruled out with CT. --CT abd/pelvis no acute features -- Presumably secondary to COVID.  Now resolved.   Essential Hypertension --Control improved.  Continue Norvasc, hydralazine, Catapres, chlorthalidone. ' Acute on chronic pain secondary to spondylolisthesis L4-L5 with lumbar radiculopathy --oxycodone   Acute metabolic encephalopathy --multifactorial, consistent with delirium by description, likely secondary to recent stroke, COVID, UTI -- Resolved   UTI --completed treatment   Left PCA stroke, CAD --Plavix, aspirin, Lipitor --PT, OT, plan for CIR   AKI on CKD stage IIIb --Baseline creatinine 1.9 --AKI resolved   Diarrhea --Likely in setting of COVID.      Subjective:  Feels better, eating, nausea almost gone  Physical Exam: Vitals:   06/04/22 0411 06/04/22 1405 06/05/22 0500 06/05/22 0849  BP: (!) 185/81 (!) 157/83  (!) 154/69  Pulse: 90   86  Resp: 16   18  Temp: 98.5 F (36.9 C)   99.4 F (37.4 C)  TempSrc: Oral     SpO2: 97%   93%  Weight:   65.4 kg   Height:       Physical Exam Vitals reviewed.   Constitutional:      General: She is not in acute distress.    Appearance: She is not ill-appearing or toxic-appearing.  Cardiovascular:     Rate and Rhythm: Normal rate and regular rhythm.     Heart sounds: No murmur heard. Pulmonary:     Effort: Pulmonary effort is normal. No respiratory distress.     Breath sounds: No wheezing, rhonchi or rales.  Abdominal:     Palpations: Abdomen is soft.  Neurological:     Mental Status: She is alert.  Psychiatric:        Mood and Affect: Mood normal.        Behavior: Behavior normal.     Data Reviewed:  K+ 3.0 Creatinine stable 1.22 Mg 1.3  Family Communication: sister at bedside  Disposition: Status is: Inpatient Remains inpatient appropriate because: awaiting CIR  Planned Discharge Destination:  CIR    Time spent: 20 minutes  Author: Murray Hodgkins, MD 06/05/2022 5:54 PM  For on call review [email or web address].

## 2022-06-06 ENCOUNTER — Other Ambulatory Visit (HOSPITAL_COMMUNITY): Payer: Self-pay

## 2022-06-06 ENCOUNTER — Inpatient Hospital Stay (HOSPITAL_COMMUNITY): Payer: PPO

## 2022-06-06 LAB — URINALYSIS, ROUTINE W REFLEX MICROSCOPIC
Bilirubin Urine: NEGATIVE
Glucose, UA: NEGATIVE mg/dL
Hgb urine dipstick: NEGATIVE
Ketones, ur: NEGATIVE mg/dL
Leukocytes,Ua: NEGATIVE
Nitrite: NEGATIVE
Protein, ur: 30 mg/dL — AB
Specific Gravity, Urine: 1.009 (ref 1.005–1.030)
pH: 5 (ref 5.0–8.0)

## 2022-06-06 MED ORDER — POTASSIUM CHLORIDE CRYS ER 20 MEQ PO TBCR
60.0000 meq | EXTENDED_RELEASE_TABLET | ORAL | Status: DC
  Filled 2022-06-06: qty 3

## 2022-06-06 MED ORDER — MAGNESIUM SULFATE 2 GM/50ML IV SOLN
2.0000 g | Freq: Once | INTRAVENOUS | Status: AC
  Administered 2022-06-06: 2 g via INTRAVENOUS
  Filled 2022-06-06: qty 50

## 2022-06-06 MED ORDER — POTASSIUM CHLORIDE 20 MEQ PO PACK
40.0000 meq | PACK | ORAL | Status: AC
  Administered 2022-06-06 (×2): 40 meq via ORAL
  Filled 2022-06-06 (×2): qty 2

## 2022-06-06 NOTE — Progress Notes (Signed)
  Progress Note   Patient: Robin Arellano RKY:[id number] DOB: Apr 28, 1951 DOA: 05/26/2022     11 DOS: the patient was seen and examined on 06/06/2022   Brief hospital course: 71 year old woman recent diagnosis left PCA stroke with discharged to inpatient rehab 7/11, readmitted 7/18 to acute care for fever secondary to [place].  Plan for readmission to CIR when bed available.  Assessment and Plan: COVID-19 positive 05/26/22, with associated n/v; isolation up 7/25 --Paxlovid stopped 7/18 due to rise in Cr  --Remains stable on room air --Supportive treatments as ordered, encourage IS, encourage prone positioning, encourage mobilization  --CTA chest negative for PE --Dominant symptom was nausea and vomiting initially, reporting malaise and cough this AM. Febrile to over 100F -Ordered and reviewed CXR, clear -Blood cx and UA pending   Nausea, vomiting. Acute pancreatitis considered based on elevated lipase and symptoms, but now ruled out with CT. --CT abd/pelvis no acute features -- Presumably secondary to COVID.  Now resolved.   Essential Hypertension --Control improved.  Continue Norvasc, hydralazine, Catapres, chlorthalidone. ' Acute on chronic pain secondary to spondylolisthesis L4-L5 with lumbar radiculopathy --oxycodone   Acute metabolic encephalopathy --multifactorial, consistent with delirium by description, likely secondary to recent stroke, COVID, UTI -- Resolved   UTI --completed treatment -repeat urine cx given fevers   Left PCA stroke, CAD --Plavix, aspirin, Lipitor --PT, OT, plan for CIR when stable   AKI on CKD stage IIIb --Baseline creatinine 1.9 --AKI resolved   Diarrhea --Likely in setting of COVID.   Hypomagnesemia -Will replace  Hypokalemia -Will replace      Subjective: Reports increased malaise and coughing. Fevers this AM  Physical Exam: Vitals:   06/05/22 2036 06/06/22 0516 06/06/22 1102 06/06/22 1502  BP: (!) 158/76 (!) 148/71 (!) 165/78  (!) 150/66  Pulse: 93 90    Resp: 18 18    Temp: 99.9 F (37.7 C) (!) 100.9 F (38.3 C)    TempSrc: Oral Oral    SpO2: 95% 95%    Weight:  63.7 kg    Height:       General exam: Awake, laying in bed, in nad Respiratory system: Normal respiratory effort, no wheezing Cardiovascular system: regular rate, s1, s2 Gastrointestinal system: Soft, nondistended, positive BS Central nervous system: CN2-12 grossly intact, strength intact Extremities: Perfused, no clubbing Skin: Normal skin turgor, no notable skin lesions seen Psychiatry: Mood normal // no visual hallucinations   Data Reviewed:  Labs reviewed: K 3.0, Cr 1.22, Mg 1.3  Family Communication: Pt in room, family not at bedside  Disposition: Status is: Inpatient Remains inpatient appropriate because: Severity of illness  Planned Discharge Destination: Rehab    uthor: Marylu Lund, MD 06/06/2022 3:26 PM  For on call review [email or web address].

## 2022-06-06 NOTE — Progress Notes (Signed)
Inpatient Rehab Admissions Coordinator:   I continue to await insurance auth for CIR. She's being worked up for fever today as well. I will follow for potential admit pending authorization and medical readiness.  Clemens Catholic, [place], [place] Admissions Coordinator  [phone] ([place]) [phone] (office)

## 2022-06-06 NOTE — Progress Notes (Signed)
Physical Therapy Treatment Patient Details Name: Robin Arellano MRN: [id number] DOB: 19-Jul-1951 Today's Date: 06/06/2022   History of Present Illness 71 year old female with recent diagnosis of left PCA stroke with eventual discharge inpatient rehab on 7/11 presented back to the medical service due to new onset flulike symptoms, fever of 102.5 F and COVID PCR positivity; with past medical history of fibromyalgia, chronic low back pain status post L4-L5 posterior lumbar fusion 6/15, coronary artery disease (S/P cath 11/2018 mild nonobstructive CAD), hyperlipidemia, chronic kidney disease stage IIIb (baseline Cr 1.6-1.7), 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 Pt then has hacking cough attack and reports difficulty with clearing phlegm. PT provided education on lung positioning to help with clearing secretions and pt reluctantly agreeable to sit up in recliner. Pt is min A for bed mobility, modA for transfers and min A for stepping to recliner. Pt family in room at end of session. PT tried to assist in finding a food on floor she feels she could eat, however was unsuccessful. Educated on need to be eating. D/c plans remain appropriate at this time. PT will continue to follow acutely.   Recommendations for follow up therapy are one component of a multi-disciplinary discharge planning process, led by the attending physician.  Recommendations may be updated based on patient status, additional functional criteria and insurance authorization.  Follow Up Recommendations  Acute inpatient rehab (3hours/day)     Assistance Recommended at Discharge Frequent or constant Supervision/Assistance  Patient can return home with the following A little help with walking and/or transfers;Assistance with cooking/housework;Direct supervision/assist for medications management;Direct supervision/assist for financial  management;Assist for transportation;Help with stairs or ramp for entrance;A lot of help with bathing/dressing/bathroom   Equipment Recommendations  Rolling walker (2 wheels);BSC/3in1       Precautions / Restrictions Precautions Precautions: Back;Fall (spine surgery 04/26/22) Precaution Booklet Issued: No Precaution Comments: pt states 1 of 3 back precautions; as PT demonstrates what not to do, she is able to name them Required Braces or Orthoses: Spinal Brace Spinal Brace: Lumbar corset;Applied in sitting position Spinal Brace Comments: LSO in room Restrictions Weight Bearing Restrictions: No Other Position/Activity Restrictions: Brace in room - LSO     Mobility  Bed Mobility Overal bed mobility: Needs Assistance Bed Mobility: Rolling, Sidelying to Sit, Sit to Sidelying Rolling: Min guard Sidelying to sit: HOB elevated, Min assist (with rail)       General bed mobility comments: requires increased cuing for rolling and min A for pushing trunk to upright    Transfers Overall transfer level: Needs assistance Equipment used: Rolling walker (2 wheels) Transfers: Sit to/from Stand Sit to Stand: Mod assist           General transfer comment: vc for hand placement for power up to RW, requires modA for power up and steadying, pt with increased posterior lean requires increased time and posterior support to bring CoG over BoS    Ambulation/Gait Ambulation/Gait assistance: Min assist Gait Distance (Feet): 3 Feet Assistive device: Rolling walker (2 wheels) Gait Pattern/deviations: Decreased step length - right, Decreased step length - left, Shuffle, Step-to pattern Gait velocity: significantly impaired Gait velocity interpretation: <1.31 ft/sec, indicative of household ambulator   General Gait Details: very short, steps, from bed to recliner, sisters providing increased encouragement      Modified Rankin (Stroke Patients Only) Modified Rankin (Stroke Patients  Only) Pre-Morbid Rankin  Score: Moderate disability Modified Rankin: Moderately severe disability     Balance Overall balance assessment: Needs assistance Sitting-balance support: Feet supported Sitting balance-Leahy Scale: Good Sitting balance - Comments: close supervision due to feeling woozy   Standing balance support: Bilateral upper extremity supported Standing balance-Leahy Scale: Poor Standing balance comment: requires at least one UE support                            Cognition Arousal/Alertness: Awake/alert Behavior During Therapy: WFL for tasks assessed/performed Overall Cognitive Status: Impaired/Different from baseline Area of Impairment: Memory, Following commands, Problem solving                     Memory: Decreased recall of precautions, Decreased short-term memory Following Commands: Follows one step commands with increased time (and multimodal cues)     Problem Solving: Requires verbal cues, Requires tactile cues, Difficulty sequencing, Slow processing General Comments: continues to say nonsensical things, internally distracted           General Comments General comments (skin integrity, edema, etc.): pt report she has continued difficulty finding something to eat, provided with Strawberry [place] ice which pt enjoyed during last session together and she is unable to tolerate today      Pertinent Vitals/Pain Pain Assessment Pain Assessment: Faces Faces Pain Scale: Hurts little more Pain Location: low back and stomach Pain Descriptors / Indicators: Aching, Sore, Grimacing, Guarding Pain Intervention(s): Limited activity within patient's tolerance, Monitored during session, Repositioned     PT Goals (current goals can now be found in the care plan section) Acute Rehab PT Goals Patient Stated Goal: less body aches PT Goal Formulation: With patient Time For Goal Achievement: 06/03/22 Potential to Achieve Goals: Good Progress towards  PT goals: Progressing toward goals    Frequency    Min 3X/week      PT Plan Current plan remains appropriate       AM-PAC PT "6 Clicks" Mobility   Outcome Measure  Help needed turning from your back to your side while in a flat bed without using bedrails?: A Little Help needed moving from lying on your back to sitting on the side of a flat bed without using bedrails?: A Little Help needed moving to and from a bed to a chair (including a wheelchair)?: A Little Help needed standing up from a chair using your arms (e.g., wheelchair or bedside chair)?: A Little Help needed to walk in hospital room?: A Lot Help needed climbing 3-5 steps with a railing? : Total 6 Click Score: 15    End of Session Equipment Utilized During Treatment: Back brace Activity Tolerance: Treatment limited secondary to medical complications (Comment) (stomachache) Patient left: with call bell/phone within reach;with family/visitor present;in chair;with chair alarm set Nurse Communication: Mobility status PT Visit Diagnosis: Unsteadiness on feet (R26.81);Muscle weakness (generalized) (M62.81);History of falling (Z91.81);Repeated falls (R29.6);Difficulty in walking, not elsewhere classified (R26.2);Dizziness and giddiness (R42)     Time: 7035-0093 PT Time Calculation (min) (ACUTE ONLY): 26 min  Charges:  $Gait Training: 8-22 mins $Therapeutic Activity: 8-22 mins                     Stalin Gruenberg B. Migdalia Dk PT, DPT Acute Rehabilitation Services Please use secure chat or  Call Office [phone]    [place] 06/06/2022, 12:17 PM

## 2022-06-06 NOTE — Care Management Important Message (Signed)
Important Message  Patient Details  Name: Robin Arellano MRN: [id number] Date of Birth: [birth year]   Medicare Important Message Given:  Yes     Cynde Menard 06/06/2022, 3:10 PM

## 2022-06-07 LAB — COMPREHENSIVE METABOLIC PANEL
ALT: 14 U/L (ref 0–44)
AST: 17 U/L (ref 15–41)
Albumin: 2.4 g/dL — ABNORMAL LOW (ref 3.5–5.0)
Alkaline Phosphatase: 143 U/L — ABNORMAL HIGH (ref 38–126)
Anion gap: 12 (ref 5–15)
BUN: 9 mg/dL (ref 8–23)
CO2: 23 mmol/L (ref 22–32)
Calcium: 8.4 mg/dL — ABNORMAL LOW (ref 8.9–10.3)
Chloride: 96 mmol/L — ABNORMAL LOW (ref 98–111)
Creatinine, Ser: 1.31 mg/dL — ABNORMAL HIGH (ref 0.44–1.00)
GFR, Estimated: 44 mL/min — ABNORMAL LOW (ref >60)
Glucose, Bld: 97 mg/dL (ref 70–99)
Potassium: 2.9 mmol/L — ABNORMAL LOW (ref 3.5–5.1)
Sodium: 131 mmol/L — ABNORMAL LOW (ref 135–145)
Total Bilirubin: 0.6 mg/dL (ref 0.3–1.2)
Total Protein: 6.4 g/dL — ABNORMAL LOW (ref 6.5–8.1)

## 2022-06-07 LAB — CBC
HCT: 22.8 % — ABNORMAL LOW (ref 36.0–46.0)
Hemoglobin: 8 g/dL — ABNORMAL LOW (ref 12.0–15.0)
MCH: 32.4 pg (ref 26.0–34.0)
MCHC: 35.1 g/dL (ref 30.0–36.0)
MCV: 92.3 fL (ref 80.0–100.0)
Platelets: 359 10*3/uL (ref 150–400)
RBC: 2.47 MIL/uL — ABNORMAL LOW (ref 3.87–5.11)
RDW: 13.4 % (ref 11.5–15.5)
WBC: 10.2 10*3/uL (ref 4.0–10.5)
nRBC: 0 % (ref 0.0–0.2)

## 2022-06-07 LAB — C-REACTIVE PROTEIN: CRP: 34.4 mg/dL — ABNORMAL HIGH (ref <1.0)

## 2022-06-07 LAB — D-DIMER, QUANTITATIVE: D-Dimer, Quant: 3.5 ug/mL-FEU — ABNORMAL HIGH (ref 0.00–0.50)

## 2022-06-07 LAB — MAGNESIUM: Magnesium: 2 mg/dL (ref 1.7–2.4)

## 2022-06-07 MED ORDER — METHYLPREDNISOLONE SODIUM SUCC 125 MG IJ SOLR
1.0000 mg/kg | Freq: Two times a day (BID) | INTRAMUSCULAR | Status: AC
  Administered 2022-06-07 – 2022-06-10 (×6): 63.125 mg via INTRAVENOUS
  Filled 2022-06-07 (×6): qty 2

## 2022-06-07 MED ORDER — LACTATED RINGERS IV SOLN: INTRAVENOUS | Status: DC

## 2022-06-07 MED ORDER — PREDNISONE 50 MG PO TABS
50.0000 mg | ORAL_TABLET | Freq: Every day | ORAL | Status: DC
  Administered 2022-06-11 – 2022-06-15 (×5): 50 mg via ORAL
  Filled 2022-06-07 (×5): qty 1

## 2022-06-07 MED ORDER — PANTOPRAZOLE SODIUM 40 MG PO TBEC
40.0000 mg | DELAYED_RELEASE_TABLET | Freq: Two times a day (BID) | ORAL | Status: DC
  Administered 2022-06-07 – 2022-06-15 (×17): 40 mg via ORAL
  Filled 2022-06-07 (×17): qty 1

## 2022-06-07 MED ORDER — MOLNUPIRAVIR EUA 200MG CAPSULE
4.0000 | ORAL_CAPSULE | Freq: Two times a day (BID) | ORAL | Status: AC
  Administered 2022-06-07 – 2022-06-12 (×10): 800 mg via ORAL
  Filled 2022-06-07: qty 4

## 2022-06-07 MED ORDER — POTASSIUM CHLORIDE CRYS ER 20 MEQ PO TBCR
60.0000 meq | EXTENDED_RELEASE_TABLET | ORAL | Status: DC
Start: 2022-06-07 — End: 2022-06-07

## 2022-06-07 MED ORDER — ONDANSETRON HCL 4 MG/2ML IJ SOLN
4.0000 mg | Freq: Three times a day (TID) | INTRAMUSCULAR | Status: DC | PRN
  Administered 2022-06-07 – 2022-06-15 (×8): 4 mg via INTRAVENOUS
  Filled 2022-06-07 (×10): qty 2

## 2022-06-07 MED ORDER — POTASSIUM CHLORIDE 20 MEQ PO PACK
60.0000 meq | PACK | ORAL | Status: AC
  Administered 2022-06-07 (×2): 60 meq via ORAL
  Filled 2022-06-07 (×2): qty 3

## 2022-06-07 NOTE — Progress Notes (Signed)
IP rehab admissions - patient not medically ready for CIR today.  I am also waiting on Psychologist, occupational for potential authorization of re-admit to acute inpatient rehab.  Will follow up again tomorrow.  Call for questions.  [phone]

## 2022-06-07 NOTE — Progress Notes (Signed)
Physical Therapy Treatment Patient Details Name: Robin Arellano MRN: [id number] DOB: 14-Apr-1951 Today's Date: 06/07/2022   History of Present Illness 71 year old female with recent diagnosis of left PCA stroke with eventual discharge inpatient rehab on 7/11 presented back to the medical service due to new onset flulike symptoms, fever of 102.5 F and COVID PCR positivity; with past medical history of fibromyalgia, chronic low back pain status post L4-L5 posterior lumbar fusion 6/15, coronary artery disease (S/P cath 11/2018 mild nonobstructive CAD), hyperlipidemia, chronic kidney disease stage IIIb (baseline Cr 1.6-1.7), 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 With sister and PT encouragement, reluctantly agrees to getting up to walk in room. Pt continues to be limited in safe mobility by decreased cognition in particular adherence to back precautions, and decreased safety awareness, as well as decreased strength and balance. Pt requires increased cuing throughout session for safety. Pt is min A for bed mobility, min A for transfers and modA for ambulation in room. D/c plan remains appropriate at this time. PT will continue to follow acutely.   Recommendations for follow up therapy are one component of a multi-disciplinary discharge planning process, led by the attending physician.  Recommendations may be updated based on patient status, additional functional criteria and insurance authorization.  Follow Up Recommendations  Acute inpatient rehab (3hours/day)     Assistance Recommended at Discharge Frequent or constant Supervision/Assistance  Patient can return home with the following A little help with walking and/or transfers;Assistance with cooking/housework;Direct supervision/assist for medications management;Direct supervision/assist for financial  management;Assist for transportation;Help with stairs or ramp for entrance;A lot of help with bathing/dressing/bathroom   Equipment Recommendations  Rolling walker (2 wheels);BSC/3in1       Precautions / Restrictions Precautions Precautions: Back;Fall (spine surgery 04/26/22) Precaution Booklet Issued: No Precaution Comments: able to recall back precautions however unable to demonstrate no twisting or bending without suing Required Braces or Orthoses: Spinal Brace Spinal Brace: Lumbar corset;Applied in sitting position Spinal Brace Comments: LSO in room Restrictions Weight Bearing Restrictions: No Other Position/Activity Restrictions: Brace in room - LSO     Mobility  Bed Mobility Overal bed mobility: Needs Assistance Bed Mobility: Rolling, Sidelying to Sit, Sit to Sidelying Rolling: Min assist Sidelying to sit: HOB elevated, Min assist (with rail)       General bed mobility comments: requires increased cuing for performing rolling, instead of twisting in bed to get to EoB, nd min A for pushing trunk to upright    Transfers Overall transfer level: Needs assistance Equipment used: Rolling walker (2 wheels) Transfers: Sit to/from Stand Sit to Stand: Min assist           General transfer comment: increased cuing for hand placement for scooting hips forward and for power up to standing, minimal physical assist to come to standing from bed and BSC    Ambulation/Gait Ambulation/Gait assistance: Min assist Gait Distance (Feet): 5 Feet (+10) Assistive device: Rolling walker (2 wheels) Gait Pattern/deviations: Decreased step length - right, Decreased step length - left, Shuffle, Step-to pattern Gait velocity: significantly impaired Gait velocity interpretation: <1.31 ft/sec, indicative of household ambulator   General Gait Details: very shortened step on L, able to improve length with cuing, however if not cued has to take 2 steps on L to equal one step on R  Modified Rankin (Stroke Patients Only) Modified Rankin (Stroke Patients Only) Pre-Morbid Rankin Score: Moderate disability Modified Rankin: Moderately severe disability     Balance Overall balance assessment: Needs assistance Sitting-balance support: Feet supported Sitting balance-Leahy Scale: Good Sitting balance - Comments: close supervision due to feeling woozy   Standing balance support: Bilateral upper extremity supported Standing balance-Leahy Scale: Poor Standing balance comment: requires at least one UE support                            Cognition Arousal/Alertness: Awake/alert Behavior During Therapy: WFL for tasks assessed/performed, Flat affect Overall Cognitive Status: Impaired/Different from baseline Area of Impairment: Memory, Following commands, Problem solving                     Memory: Decreased recall of precautions, Decreased short-term memory Following Commands: Follows one step commands with increased time (and multimodal cues) Safety/Judgement: Decreased awareness of safety Awareness: Emergent Problem Solving: Requires verbal cues, Requires tactile cues, Difficulty sequencing, Slow processing General Comments: uses humor as foil for decreased cognition           General Comments General comments (skin integrity, edema, etc.): sister in room providing encouragement      Pertinent Vitals/Pain Pain Assessment Pain Assessment: Faces Faces Pain Scale: Hurts little more Pain Location: low back and L hip Pain Descriptors / Indicators: Aching, Sore, Grimacing, Guarding Pain Intervention(s): Limited activity within patient's tolerance, Monitored during session, Repositioned     PT Goals (current goals can now be found in the care plan section) Acute Rehab PT Goals Patient Stated Goal: less body aches PT Goal Formulation: With patient Time For Goal Achievement: 06/03/22 Potential to Achieve Goals: Good Progress towards PT goals:  Progressing toward goals    Frequency    Min 3X/week      PT Plan Current plan remains appropriate    Co-evaluation              AM-PAC PT "6 Clicks" Mobility   Outcome Measure  Help needed turning from your back to your side while in a flat bed without using bedrails?: A Little Help needed moving from lying on your back to sitting on the side of a flat bed without using bedrails?: A Little Help needed moving to and from a bed to a chair (including a wheelchair)?: A Little Help needed standing up from a chair using your arms (e.g., wheelchair or bedside chair)?: A Little Help needed to walk in hospital room?: A Lot Help needed climbing 3-5 steps with a railing? : Total 6 Click Score: 15    End of Session Equipment Utilized During Treatment: Back brace Activity Tolerance: Patient tolerated treatment well Patient left: with call bell/phone within reach;with family/visitor present;in chair;with chair alarm set Nurse Communication: Mobility status PT Visit Diagnosis: Unsteadiness on feet (R26.81);Muscle weakness (generalized) (M62.81);History of falling (Z91.81);Repeated falls (R29.6);Difficulty in walking, not elsewhere classified (R26.2);Dizziness and giddiness (R42)     Time: 3790-2409 PT Time Calculation (min) (ACUTE ONLY): 41 min  Charges:  $Gait Training: 8-22 mins $Therapeutic Activity: 23-37 mins                     Teofilo Lupinacci B. Migdalia Dk PT, DPT Acute Rehabilitation Services Please use secure chat or  Call Office [phone]    [place] 06/07/2022, 2:54 PM

## 2022-06-07 NOTE — Plan of Care (Signed)
  Problem: Respiratory: Goal: Complications related to the disease process, condition or treatment will be avoided or minimized Outcome: Progressing   Problem: Health Behavior/Discharge Planning: Goal: Ability to manage health-related needs will improve Outcome: Progressing   Problem: Clinical Measurements: Goal: Ability to maintain clinical measurements within normal limits will improve Outcome: Progressing   Problem: Clinical Measurements: Goal: Will remain free from infection Outcome: Progressing   Problem: Clinical Measurements: Goal: Respiratory complications will improve Outcome: Progressing

## 2022-06-07 NOTE — Progress Notes (Signed)
Occupational Therapy Treatment Patient Details Name: Robin Arellano MRN: [id number] DOB: [birth year] Today's Date: 06/07/2022   History of present illness 71 year old female with recent diagnosis of left PCA stroke with eventual discharge inpatient rehab on 7/11 presented back to the medical service due to new onset flulike symptoms, fever of 102.5 F and COVID PCR positivity; with past medical history of fibromyalgia, chronic low back pain status post L4-L5 posterior lumbar fusion 6/15, coronary artery disease (S/P cath 11/2018 mild nonobstructive CAD), hyperlipidemia, chronic kidney disease stage IIIb (baseline Cr 1.6-1.7), 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 Patient educated on importance of getting out bed and participating with therapy. Patient agreed with encouragement. Patient required cues and min assist to get to EOB and to stand to RW with verbal cues for hand placement. Patient required cues for safe walker use for transfer and cues to reach back to sit. Patient was setup for grooming seated in recliner. LB dressing attempted with patient able to doff socks but required mod assist to donn new pair. Patient stated she felt better after getting into chair. Acute OT to continue to follow.    Recommendations for follow up therapy are one component of a multi-disciplinary discharge planning process, led by the attending physician.  Recommendations may be updated based on patient status, additional functional criteria and insurance authorization.    Follow Up Recommendations  Acute inpatient rehab (3hours/day)    Assistance Recommended at Discharge Frequent or constant Supervision/Assistance  Patient can return home with the following  A little help with walking and/or transfers;A little help with bathing/dressing/bathroom;Assistance with  cooking/housework;Direct supervision/assist for medications management;Direct supervision/assist for financial management;Assist for transportation;Help with stairs or ramp for entrance   Equipment Recommendations  None recommended by OT    Recommendations for Other Services      Precautions / Restrictions Precautions Precautions: Back;Fall (spine surgery 04/26/22) Precaution Booklet Issued: No Precaution Comments: requires verbal cues for back precautions Required Braces or Orthoses: Spinal Brace Spinal Brace: Lumbar corset;Applied in sitting position Spinal Brace Comments: LSO in room Restrictions Weight Bearing Restrictions: No       Mobility Bed Mobility Overal bed mobility: Needs Assistance Bed Mobility: Rolling, Sidelying to Sit Rolling: Min guard Sidelying to sit: HOB elevated, Min assist       General bed mobility comments: verbal cues for hand placement and log rolling technique    Transfers Overall transfer level: Needs assistance Equipment used: Rolling walker (2 wheels) Transfers: Sit to/from Stand, Bed to chair/wheelchair/BSC Sit to Stand: Min assist     Step pivot transfers: Min assist     General transfer comment: min assist and increased time. Verbal cues for hand placement to power up and to lower to chair.     Balance Overall balance assessment: Needs assistance Sitting-balance support: Feet supported Sitting balance-Leahy Scale: Good     Standing balance support: Bilateral upper extremity supported Standing balance-Leahy Scale: Poor Standing balance comment: reliant on RW for support                           ADL either performed or assessed with clinical judgement   ADL Overall ADL's : Needs assistance/impaired     Grooming: Wash/dry hands;Wash/dry face;Oral care;Set up;Sitting Grooming Details (indicate cue type and reason): in recliner  Lower Body Dressing: Moderate assistance Lower Body Dressing Details  (indicate cue type and reason): able to doff socks with figure 4 but was mod assist to [employer] Transfer: Minimal Producer, television/film/video Details (indicate cue type and reason): simulated to recliner           General ADL Comments: declined standing ADLs    Extremity/Trunk Assessment              Vision       Perception     Praxis      Cognition Arousal/Alertness: Awake/alert Behavior During Therapy: WFL for tasks assessed/performed Overall Cognitive Status: Impaired/Different from baseline Area of Impairment: Memory, Following commands, Problem solving                     Memory: Decreased recall of precautions, Decreased short-term memory Following Commands: Follows one step commands with increased time Safety/Judgement: Decreased awareness of safety Awareness: Emergent Problem Solving: Requires verbal cues, Requires tactile cues, Difficulty sequencing, Slow processing General Comments: spoke softly at times when asked to repeat unable to recall what she was saying.        Exercises      Shoulder Instructions       General Comments      Pertinent Vitals/ Pain       Pain Assessment Pain Assessment: Faces Faces Pain Scale: Hurts little more Pain Location: low back and stomach Pain Descriptors / Indicators: Aching, Sore, Grimacing, Guarding Pain Intervention(s): Limited activity within patient's tolerance, Monitored during session, Premedicated before session, Repositioned  Home Living                                          Prior Functioning/Environment              Frequency  Min 2X/week        Progress Toward Goals  OT Goals(current goals can now be found in the care plan section)  Progress towards OT goals: Progressing toward goals  Acute Rehab OT Goals Patient Stated Goal: get better OT Goal Formulation: With patient Time For Goal Achievement: 06/14/22 Potential to Achieve  Goals: Good ADL Goals Pt Will Perform Grooming: Independently;standing Pt Will Perform Upper Body Bathing: with modified independence;sitting Pt Will Perform Lower Body Bathing: with modified independence;sit to/from stand Pt Will Perform Upper Body Dressing: Independently;sitting Pt Will Perform Lower Body Dressing: with modified independence;sit to/from stand Pt Will Transfer to Toilet: with modified independence;ambulating;bedside commode Pt Will Perform Toileting - Clothing Manipulation and hygiene: with modified independence;sit to/from stand Pt Will Perform Tub/Shower Transfer: Shower transfer;with supervision;shower seat;ambulating;rolling walker;grab bars Additional ADL Goal #1: Pt will be Mod I in and OOB for basic ADLs  Plan Discharge plan remains appropriate    Co-evaluation                 AM-PAC OT "6 Clicks" Daily Activity     Outcome Measure   Help from another person eating meals?: None Help from another person taking care of personal grooming?: A Little Help from another person toileting, which includes using toliet, bedpan, or urinal?: A Little Help from another person bathing (including washing, rinsing, drying)?: A Little Help from another person to put on and taking off regular upper body clothing?: A Little Help from another person to put on and taking off regular lower body clothing?: A Little 6 Click Score:  19    End of Session Equipment Utilized During Treatment: Rolling walker (2 wheels);Back brace  OT Visit Diagnosis: Unsteadiness on feet (R26.81);Other abnormalities of gait and mobility (R26.89);Muscle weakness (generalized) (M62.81);Pain;History of falling (Z91.81);Other symptoms and signs involving cognitive function   Activity Tolerance Patient tolerated treatment well   Patient Left in chair;with call bell/phone within reach;with chair alarm set   Nurse Communication Mobility status        Time: 1010-1037 OT Time Calculation (min): 27  min  Charges: OT General Charges $OT Visit: 1 Visit OT Treatments $Self Care/Home Management : 23-37 mins  Lodema Hong, [place]  Office [phone]   Trixie Dredge 06/07/2022, 12:17 PM

## 2022-06-07 NOTE — Progress Notes (Signed)
  Progress Note   Patient: Robin Arellano EXN:[id number] DOB: Sep 10, 1951 DOA: 05/26/2022     12 DOS: the patient was seen and examined on 06/07/2022   Brief hospital course: 71 year old woman recent diagnosis left PCA stroke with discharged to inpatient rehab 7/11, readmitted 7/18 to acute care for fever secondary to [place].  Plan for readmission to CIR when bed available.  Assessment and Plan: COVID-19 positive 05/26/22, with associated n/v; isolation up 7/25 --Paxlovid stopped 7/18 due to rise in Cr  --Remains stable on room air --Supportive treatments as ordered, encourage IS, encourage prone positioning, encourage mobilization  --CTA chest negative for PE -Now with coughing and coarse breath sounds -Recent CXR, clear -UA neg, blood cx pending -Pt also complaining of malaise. Will repeat CRP and ddimer   Nausea, vomiting. Acute pancreatitis considered based on elevated lipase and symptoms, but now ruled out with CT. --CT abd/pelvis no acute features -- Presumably secondary to COVID.   -Cont anti-emetic as needed   Essential Hypertension --Control improved.  Continue Norvasc, hydralazine, Catapres, chlorthalidone. ' Acute on chronic pain secondary to spondylolisthesis L4-L5 with lumbar radiculopathy --oxycodone   Acute metabolic encephalopathy --multifactorial, consistent with delirium by description, likely secondary to recent stroke, COVID, UTI -- Resolved   UTI --completed treatment -repeat UA neg   Left PCA stroke, CAD --Plavix, aspirin, Lipitor --PT, OT, plan for CIR when stable   AKI on CKD stage IIIb --Baseline creatinine 1.9 --AKI resolved   Diarrhea --Likely in setting of COVID.   Hypomagnesemia -repalced  Hypokalemia -will replace      Subjective: Complaining of continued malaise, cough  Physical Exam: Vitals:   06/06/22 1502 06/06/22 2046 06/07/22 0559 06/07/22 0839  BP: (!) 150/66   (!) 163/70  Pulse:    84  Resp:    16  Temp:  98.5 F  (36.9 C)  99.4 F (37.4 C)  TempSrc:  Oral  Oral  SpO2:    91%  Weight:   63.2 kg   Height:       General exam: Conversant, in no acute distress Respiratory system: normal chest rise, coarse, active coughing Cardiovascular system: regular rhythm, s1-s2 Gastrointestinal system: Nondistended, nontender, pos BS Central nervous system: No seizures, no tremors Extremities: No cyanosis, no joint deformities Skin: No rashes, no pallor Psychiatry: Affect normal // no auditory hallucinations   Data Reviewed:  Labs reviewed: K 2.9, Cr 1.31, Mg 2.0  Family Communication: Pt in room, family not at bedside  Disposition: Status is: Inpatient Remains inpatient appropriate because: Severity of illness  Planned Discharge Destination: Rehab    uthor: Marylu Lund, MD 06/07/2022 3:11 PM  For on call review [email or web address].

## 2022-06-08 ENCOUNTER — Inpatient Hospital Stay (HOSPITAL_COMMUNITY): Payer: PPO

## 2022-06-08 DIAGNOSIS — R7989 Other specified abnormal findings of blood chemistry: Secondary | ICD-10-CM

## 2022-06-08 LAB — PROCALCITONIN: Procalcitonin: 0.3 ng/mL

## 2022-06-08 LAB — CBC
HCT: 21.7 % — ABNORMAL LOW (ref 36.0–46.0)
Hemoglobin: 7.4 g/dL — ABNORMAL LOW (ref 12.0–15.0)
MCH: 32.2 pg (ref 26.0–34.0)
MCHC: 34.1 g/dL (ref 30.0–36.0)
MCV: 94.3 fL (ref 80.0–100.0)
Platelets: 384 10*3/uL (ref 150–400)
RBC: 2.3 MIL/uL — ABNORMAL LOW (ref 3.87–5.11)
RDW: 13.3 % (ref 11.5–15.5)
WBC: 8 10*3/uL (ref 4.0–10.5)
nRBC: 0 % (ref 0.0–0.2)

## 2022-06-08 LAB — D-DIMER, QUANTITATIVE: D-Dimer, Quant: 2.85 ug/mL-FEU — ABNORMAL HIGH (ref 0.00–0.50)

## 2022-06-08 LAB — COMPREHENSIVE METABOLIC PANEL
ALT: 13 U/L (ref 0–44)
AST: 16 U/L (ref 15–41)
Albumin: 2.1 g/dL — ABNORMAL LOW (ref 3.5–5.0)
Alkaline Phosphatase: 124 U/L (ref 38–126)
Anion gap: 9 (ref 5–15)
BUN: 11 mg/dL (ref 8–23)
CO2: 25 mmol/L (ref 22–32)
Calcium: 8.4 mg/dL — ABNORMAL LOW (ref 8.9–10.3)
Chloride: 97 mmol/L — ABNORMAL LOW (ref 98–111)
Creatinine, Ser: 1.34 mg/dL — ABNORMAL HIGH (ref 0.44–1.00)
GFR, Estimated: 42 mL/min — ABNORMAL LOW (ref >60)
Glucose, Bld: 160 mg/dL — ABNORMAL HIGH (ref 70–99)
Potassium: 3.2 mmol/L — ABNORMAL LOW (ref 3.5–5.1)
Sodium: 131 mmol/L — ABNORMAL LOW (ref 135–145)
Total Bilirubin: 0.5 mg/dL (ref 0.3–1.2)
Total Protein: 6.3 g/dL — ABNORMAL LOW (ref 6.5–8.1)

## 2022-06-08 LAB — C-REACTIVE PROTEIN: CRP: 34.3 mg/dL — ABNORMAL HIGH (ref <1.0)

## 2022-06-08 MED ORDER — POTASSIUM CHLORIDE 20 MEQ PO PACK
40.0000 meq | PACK | ORAL | Status: AC
  Administered 2022-06-08 (×2): 40 meq via ORAL
  Filled 2022-06-08 (×2): qty 2

## 2022-06-08 NOTE — TOC Progression Note (Addendum)
Transition of Care [facility]) - Initial/Assessment Note    Patient Details  Name: Robin Arellano MRN: [id number] Date of Birth: [birth year]  [facility] CM/SW Contact:    Ralene Bathe, LCSWA Phone Number: 06/08/2022, 11:01 AM  Clinical Narrative:                 CSW informed of insurance denying CIR and met with the patient at bedside.  The patient declined SNF placement and called advocate, Darel Hong, and the family would like to appeal the denial.  CIR admissions coordinator informed and CSW informed that CIR will not be appealing.    TOC will continue to follow.          Patient Goals and CMS Choice        Expected Discharge Plan and Services                                                Prior Living Arrangements/Services                       Activities of Daily Living Home Assistive Devices/Equipment: None ADL Screening (condition at time of admission) Patient's cognitive ability adequate to safely complete daily activities?: Yes Is the patient deaf or have difficulty hearing?: No Does the patient have difficulty seeing, even when wearing glasses/contacts?: No Does the patient have difficulty concentrating, remembering, or making decisions?: No Patient able to express need for assistance with ADLs?: Yes Does the patient have difficulty dressing or bathing?: Yes Independently performs ADLs?: Yes (appropriate for developmental age) Does the patient have difficulty walking or climbing stairs?: Yes Weakness of Legs: Both Weakness of Arms/Hands: None  Permission Sought/Granted                  Emotional Assessment              Admission diagnosis:  COVID-19 virus infection [U07.1] Patient Active Problem List   Diagnosis Date Noted   Nausea & vomiting 06/01/2022   Chronic kidney disease, stage 3b (HCC) 05/27/2022   Mixed hyperlipidemia 05/27/2022   Pressure injury of skin 05/27/2022   COVID-19 virus infection  05/26/2022   Hyponatremia    Acute left PCA stroke (HCC) 05/22/2022   Hypertensive emergency    Pseudoaneurysm (HCC) 05/14/2022   Spondylolisthesis at L4-L5 level 04/26/2022   Hyperlipidemia 11/25/2020   Essential hypertension 07/11/2020   Common bile duct dilation    Acute pancreatitis 04/01/2020   Anxiety    Hypokalemia    Mild renal insufficiency    Elevated troponin    Hypertensive urgency    Elevated liver enzymes    Abnormal magnetic resonance imaging of abdomen    Coronary artery disease involving native coronary artery of native heart without angina pectoris    Chest pain    Herpes zoster without complication 02/07/2018   Abdominal pain 02/07/2018   Abdominal pain, epigastric 02/07/2018   Low back pain 07/09/2013   Neck pain 07/09/2013   Headache(784.0) 07/09/2013   Fibromyalgia 07/09/2013   PCP:  Salli Real, MD Pharmacy:   [facility] [phone] - [place], [place] - [address] AT [facility] [address] [place] [address] Phone: [phone] Fax: [phone]     Social Determinants of Health (SDOH) Interventions  Readmission Risk Interventions     No data to display

## 2022-06-08 NOTE — Progress Notes (Signed)
IP rehab admissions - I have received a denial from insurance carrier for acute inpatient rehab.  The medical director feels that SNF placement will be needed for this patient.  Call me for questions.  [phone]

## 2022-06-08 NOTE — Progress Notes (Signed)
IP rehab admissions - I received a call from patient's sister.  I explained in detail why we would not be appealing denial from insurance carrier.  I told patient's sister in [place] that patient could not tolerate inpatient rehab and that she has been re-isolated for what MD feels is re-bound Covid.  I told the sister that options at this point are SNF placement once patient is medically ready for discharge.  Call for questions.  [phone]

## 2022-06-08 NOTE — Progress Notes (Addendum)
  Progress Note   Patient: Robin Arellano MWN:[id number] DOB: Apr 09, 1951 DOA: 05/26/2022     13 DOS: the patient was seen and examined on 06/08/2022   Brief hospital course: 71 year old woman recent diagnosis left PCA stroke with discharged to inpatient rehab 7/11, readmitted 7/18 to acute care for fever secondary to [place].  Plan for readmission to CIR when bed available.  Assessment and Plan: COVID-19 positive 05/26/22, with associated n/v; isolation up 7/25 --Paxlovid stopped 7/18 due to rise in Cr  --Remains stable on room air --Supportive treatments as ordered, encourage IS, encourage prone positioning, encourage mobilization  --CTA chest negative for PE -Recent CXR, clear -UA neg, blood cx neg -Recent increased malaise, cough, and markedly elevated CRP -Started steroids and molnupiravir. Reports feeling much better today   Nausea, vomiting. Acute pancreatitis considered based on elevated lipase and symptoms, but now ruled out with CT. --CT abd/pelvis no acute features -- Presumably secondary to COVID.   -Cont anti-emetic as needed   Essential Hypertension --Control improved.  Continue Norvasc, hydralazine, Catapres, chlorthalidone. ' Acute on chronic pain secondary to spondylolisthesis L4-L5 with lumbar radiculopathy --oxycodone   Acute metabolic encephalopathy --multifactorial, consistent with delirium by description, likely secondary to recent stroke, COVID, UTI -- Resolved   UTI --completed treatment -repeat UA neg   Left PCA stroke, CAD --Plavix, aspirin, Lipitor --PT, OT, plan for CIR when stable   AKI on CKD stage IIIb --Baseline creatinine 1.9 --AKI improved   Diarrhea --Likely in setting of COVID.   Hypomagnesemia -repalced  Hypokalemia -will replace -Repeat bmet in AM      Subjective: States feeling better today  Physical Exam: Vitals:   06/07/22 0839 06/07/22 1635 06/07/22 1842 06/08/22 1406  BP: (!) 163/70 (!) 144/67 (!) 169/69 (!)  118/59  Pulse: 84   62  Resp: 16     Temp: 99.4 F (37.4 C)     TempSrc: Oral     SpO2: 91%     Weight:      Height:       General exam: Awake, sitting in chair, in nad Respiratory system: Normal respiratory effort, no audible wheezing Cardiovascular system: perfused, no notable jvd Gastrointestinal system: Soft, nondistended Central nervous system: CN2-12 grossly intact, strength intact Extremities: Perfused, no clubbing Skin: Normal skin turgor, no notable skin lesions seen Psychiatry: Mood normal // no visual hallucinations   Data Reviewed:  Labs reviewed: K 3.2, Cr 1.34, CRP 34.3, procal 0.3  Family Communication: Pt in room, family at bedside  Disposition: Status is: Inpatient Remains inpatient appropriate because: Severity of illness  Planned Discharge Destination: Skilled nursing facility    Author: Marylu Lund, MD 06/08/2022 2:12 PM  For on call review [email or web address].

## 2022-06-08 NOTE — Progress Notes (Signed)
IP rehab admissions - call received from another sister, Roberts Gaudy.  Copy of denial letter given to nurse to give to daughter.  Dtr plans for husband to appeal the insurance denial decision.  I will have my partner follow up next week.  [phone]

## 2022-06-09 LAB — COMPREHENSIVE METABOLIC PANEL
ALT: 14 U/L (ref 0–44)
AST: 25 U/L (ref 15–41)
Albumin: 2 g/dL — ABNORMAL LOW (ref 3.5–5.0)
Alkaline Phosphatase: 110 U/L (ref 38–126)
Anion gap: 12 (ref 5–15)
BUN: 18 mg/dL (ref 8–23)
CO2: 25 mmol/L (ref 22–32)
Calcium: 8.4 mg/dL — ABNORMAL LOW (ref 8.9–10.3)
Chloride: 98 mmol/L (ref 98–111)
Creatinine, Ser: 1.39 mg/dL — ABNORMAL HIGH (ref 0.44–1.00)
GFR, Estimated: 41 mL/min — ABNORMAL LOW (ref >60)
Glucose, Bld: 138 mg/dL — ABNORMAL HIGH (ref 70–99)
Potassium: 3.9 mmol/L (ref 3.5–5.1)
Sodium: 135 mmol/L (ref 135–145)
Total Bilirubin: 0.2 mg/dL — ABNORMAL LOW (ref 0.3–1.2)
Total Protein: 5.6 g/dL — ABNORMAL LOW (ref 6.5–8.1)

## 2022-06-09 LAB — CBC
HCT: 21 % — ABNORMAL LOW (ref 36.0–46.0)
Hemoglobin: 7 g/dL — ABNORMAL LOW (ref 12.0–15.0)
MCH: 32 pg (ref 26.0–34.0)
MCHC: 33.3 g/dL (ref 30.0–36.0)
MCV: 95.9 fL (ref 80.0–100.0)
Platelets: 394 10*3/uL (ref 150–400)
RBC: 2.19 MIL/uL — ABNORMAL LOW (ref 3.87–5.11)
RDW: 13.5 % (ref 11.5–15.5)
WBC: 10.7 10*3/uL — ABNORMAL HIGH (ref 4.0–10.5)
nRBC: 0 % (ref 0.0–0.2)

## 2022-06-09 LAB — PROCALCITONIN: Procalcitonin: 0.16 ng/mL

## 2022-06-09 LAB — C-REACTIVE PROTEIN: CRP: 19.4 mg/dL — ABNORMAL HIGH (ref <1.0)

## 2022-06-09 MED ORDER — LOPERAMIDE HCL 2 MG PO CAPS
2.0000 mg | ORAL_CAPSULE | ORAL | Status: DC | PRN
  Filled 2022-06-09 (×2): qty 1

## 2022-06-09 NOTE — Progress Notes (Signed)
  Progress Note   Patient: Robin Arellano DDU:[id number] DOB: [birth year] DOA: 05/26/2022     14 DOS: the patient was seen and examined on 06/09/2022   Brief hospital course: 71 year old woman recent diagnosis left PCA stroke with discharged to inpatient rehab 7/11, readmitted 7/18 to acute care for fever secondary to [place].  Plan for readmission to CIR when bed available.  Assessment and Plan: COVID-19 positive 05/26/22, with associated n/v, now coughing and malaise --Paxlovid stopped 7/18 due to rise in Cr  --Remains stable on room air --Supportive treatments as ordered, encourage IS, encourage prone positioning, encourage mobilization  --CTA chest negative for PE -Recent CXR, clear -UA neg, blood cx neg -Recent increased malaise, cough, and markedly elevated CRP peaking to 34 -Now on steroids and molnupiravir. Improving with CRP down to 19 -recheck cmp and crp in AM   Nausea, vomiting. Acute pancreatitis considered based on elevated lipase and symptoms, but now ruled out with CT. --CT abd/pelvis no acute features -- Presumably secondary to COVID.   -Cont anti-emetic as needed -Reports continued diarrhea. Will order PRN imodium   Essential Hypertension --Control improved.  Continue Norvasc, hydralazine, Catapres, chlorthalidone. ' Acute on chronic pain secondary to spondylolisthesis L4-L5 with lumbar radiculopathy --oxycodone   Acute metabolic encephalopathy --multifactorial, consistent with delirium by description, likely secondary to recent stroke, COVID, UTI -- Resolved   UTI --completed treatment -repeat UA neg   Left PCA stroke, CAD --Plavix, aspirin, Lipitor --PT, OT, plan for CIR when stable   AKI on CKD stage IIIb --Baseline creatinine 1.9 --AKI improved   Diarrhea --Likely in setting of COVID.   Hypomagnesemia -repalced  Hypokalemia -replaced -Repeat bmet in AM      Subjective: Reports some diarrhea this AM. Complaining of cough and abd  discomfort with cough  Physical Exam: Vitals:   06/07/22 1842 06/08/22 1406 06/08/22 1816 06/09/22 0601  BP: (!) 169/69 (!) 118/59 (!) 121/55 (!) 129/59  Pulse:  62 68 63  Resp:    17  Temp:    98 F (36.7 C)  TempSrc:    Oral  SpO2:    96%  Weight:      Height:       General exam: Conversant, in no acute distress Respiratory system: normal chest rise, clear, no audible wheezing Cardiovascular system: regular rhythm, s1-s2 Gastrointestinal system: Nondistended, nontender, pos BS Central nervous system: No seizures, no tremors Extremities: No cyanosis, no joint deformities Skin: No rashes, no pallor Psychiatry: Affect normal // no auditory hallucinations   Data Reviewed:  Labs reviewed: K 3.9, Cr 1.39, CRP 19.4, procal 0.16  Family Communication: Pt in room, family at bedside  Disposition: Status is: Inpatient Remains inpatient appropriate because: Severity of illness  Planned Discharge Destination: Skilled nursing facility    Author: Marylu Lund, MD 06/09/2022 3:32 PM  For on call review [email or web address].

## 2022-06-10 LAB — HEMOGLOBIN AND HEMATOCRIT, BLOOD
HCT: 22.9 % — ABNORMAL LOW (ref 36.0–46.0)
Hemoglobin: 7.9 g/dL — ABNORMAL LOW (ref 12.0–15.0)

## 2022-06-10 LAB — VITAMIN B12: Vitamin B-12: 863 pg/mL (ref 180–914)

## 2022-06-10 LAB — IRON AND TIBC
Iron: 61 ug/dL (ref 28–170)
Saturation Ratios: 40 % — ABNORMAL HIGH (ref 10.4–31.8)
TIBC: 154 ug/dL — ABNORMAL LOW (ref 250–450)
UIBC: 93 ug/dL

## 2022-06-10 LAB — COMPREHENSIVE METABOLIC PANEL
ALT: 18 U/L (ref 0–44)
AST: 26 U/L (ref 15–41)
Albumin: 2.1 g/dL — ABNORMAL LOW (ref 3.5–5.0)
Alkaline Phosphatase: 157 U/L — ABNORMAL HIGH (ref 38–126)
Anion gap: 9 (ref 5–15)
BUN: 20 mg/dL (ref 8–23)
CO2: 24 mmol/L (ref 22–32)
Calcium: 8.4 mg/dL — ABNORMAL LOW (ref 8.9–10.3)
Chloride: 103 mmol/L (ref 98–111)
Creatinine, Ser: 1.41 mg/dL — ABNORMAL HIGH (ref 0.44–1.00)
GFR, Estimated: 40 mL/min — ABNORMAL LOW (ref >60)
Glucose, Bld: 139 mg/dL — ABNORMAL HIGH (ref 70–99)
Potassium: 3.8 mmol/L (ref 3.5–5.1)
Sodium: 136 mmol/L (ref 135–145)
Total Bilirubin: 0.5 mg/dL (ref 0.3–1.2)
Total Protein: 5.7 g/dL — ABNORMAL LOW (ref 6.5–8.1)

## 2022-06-10 LAB — CBC
HCT: 20.3 % — ABNORMAL LOW (ref 36.0–46.0)
Hemoglobin: 6.9 g/dL — CL (ref 12.0–15.0)
MCH: 32.5 pg (ref 26.0–34.0)
MCHC: 34 g/dL (ref 30.0–36.0)
MCV: 95.8 fL (ref 80.0–100.0)
Platelets: 378 10*3/uL (ref 150–400)
RBC: 2.12 MIL/uL — ABNORMAL LOW (ref 3.87–5.11)
RDW: 13.4 % (ref 11.5–15.5)
WBC: 8.9 10*3/uL (ref 4.0–10.5)
nRBC: 0 % (ref 0.0–0.2)

## 2022-06-10 LAB — PROCALCITONIN: Procalcitonin: <0.1 ng/mL

## 2022-06-10 LAB — PREPARE RBC (CROSSMATCH)

## 2022-06-10 LAB — C-REACTIVE PROTEIN: CRP: 8.9 mg/dL — ABNORMAL HIGH (ref <1.0)

## 2022-06-10 LAB — FOLATE: Folate: 6.2 ng/mL (ref >5.9)

## 2022-06-10 MED ORDER — ZOLPIDEM TARTRATE 5 MG PO TABS: 5.0000 mg | ORAL_TABLET | Freq: Every evening | ORAL | Status: DC | PRN

## 2022-06-10 MED ORDER — TRAZODONE HCL 50 MG PO TABS
100.0000 mg | ORAL_TABLET | Freq: Every evening | ORAL | Status: DC | PRN
  Administered 2022-06-10: 100 mg via ORAL
  Filled 2022-06-10: qty 2

## 2022-06-10 MED ORDER — SODIUM CHLORIDE 0.9% IV SOLUTION
Freq: Once | INTRAVENOUS | Status: AC
Start: 2022-06-10 — End: 2022-06-10

## 2022-06-10 NOTE — Progress Notes (Signed)
  Progress Note   Patient: Robin Arellano XYV:[id number] DOB: 1951/05/30 DOA: 05/26/2022     15 DOS: the patient was seen and examined on 06/10/2022   Brief hospital course: 71 year old woman recent diagnosis left PCA stroke with discharged to inpatient rehab 7/11, readmitted 7/18 to acute care for fever secondary to [place].  Plan for readmission to CIR when bed available.  Assessment and Plan: COVID-19 positive 05/26/22, with associated n/v, now coughing and malaise --Paxlovid stopped 7/18 due to rise in Cr  --Remains stable on room air --Supportive treatments as ordered, encourage IS, encourage prone positioning, encourage mobilization  --CTA chest negative for PE -Recent CXR, clear -UA neg, blood cx neg -Recent increased malaise, cough, and markedly elevated CRP peaking to 34 -Now on steroids and molnupiravir. Improving with CRP down to 8.9 today -recheck cmp and crp in AM   Nausea, vomiting. Acute pancreatitis considered based on elevated lipase and symptoms, but now ruled out with CT. --CT abd/pelvis no acute features -- Presumably secondary to COVID.   -Cont anti-emetic as needed -Reports continued diarrhea. Now on PRN imodium   Essential Hypertension --Control improved.  Continue Norvasc, hydralazine, Catapres, chlorthalidone. ' Acute on chronic pain secondary to spondylolisthesis L4-L5 with lumbar radiculopathy --oxycodone   Acute metabolic encephalopathy --multifactorial, consistent with delirium by description, likely secondary to recent stroke, COVID, UTI -- Resolved   UTI --completed treatment -repeat UA neg   Left PCA stroke, CAD --Plavix, aspirin, Lipitor --PT, OT following -CIR has been denied by pt's insurance. Family in process of appeal   AKI on CKD stage IIIb --Baseline creatinine 1.9 --AKI improved   Diarrhea --Likely in setting of COVID.   Hypomagnesemia -repalced  Hypokalemia -replaced -Repeat bmet in AM  Toxic metabolic  encephalopathy -Noted to be more confused recently -Suspect secondary to high dose steroids -Now off IV steroids, weaned to prednisone      Subjective: Noted to be more confused overnight. Pt reports feeling jittery  Physical Exam: Vitals:   06/10/22 0941 06/10/22 0953 06/10/22 1236 06/10/22 1319  BP: (!) 155/72 (!) 141/70 124/66 134/64  Pulse: (!) 50 (!) 56 (!) 53 (!) 58  Resp: '19 18 18   '$ Temp: 97.8 F (36.6 C)  97.9 F (36.6 C)   TempSrc: Oral  Oral   SpO2: 96% 98% 92%   Weight:      Height:       General exam: Awake, laying in bed, in nad Respiratory system: Normal respiratory effort, no wheezing Cardiovascular system: regular rate, s1, s2 Gastrointestinal system: Soft, nondistended, positive BS Central nervous system: CN2-12 grossly intact, strength intact Extremities: Perfused, no clubbing Skin: Normal skin turgor, no notable skin lesions seen Psychiatry: Mood normal // no visual hallucinations   Data Reviewed:  Labs reviewed: K 3.8, Cr 1.41, CRP 8.9  Family Communication: Pt in room, family at bedside  Disposition: Status is: Inpatient Remains inpatient appropriate because: Severity of illness  Planned Discharge Destination: Skilled nursing facility    Author: Marylu Lund, MD 06/10/2022 3:51 PM  For on call review [email or web address].

## 2022-06-10 NOTE — Progress Notes (Signed)
Hgb has trended down to 6.9 this am. Plan to transfuse 1 unit RBC.

## 2022-06-11 LAB — TYPE AND SCREEN
ABO/RH(D): O POS
Antibody Screen: NEGATIVE
Unit division: 0

## 2022-06-11 LAB — COMPREHENSIVE METABOLIC PANEL
ALT: 20 U/L (ref 0–44)
AST: 27 U/L (ref 15–41)
Albumin: 2 g/dL — ABNORMAL LOW (ref 3.5–5.0)
Alkaline Phosphatase: 153 U/L — ABNORMAL HIGH (ref 38–126)
Anion gap: 8 (ref 5–15)
BUN: 26 mg/dL — ABNORMAL HIGH (ref 8–23)
CO2: 26 mmol/L (ref 22–32)
Calcium: 8.4 mg/dL — ABNORMAL LOW (ref 8.9–10.3)
Chloride: 103 mmol/L (ref 98–111)
Creatinine, Ser: 1.64 mg/dL — ABNORMAL HIGH (ref 0.44–1.00)
GFR, Estimated: 33 mL/min — ABNORMAL LOW (ref >60)
Glucose, Bld: 101 mg/dL — ABNORMAL HIGH (ref 70–99)
Potassium: 3.7 mmol/L (ref 3.5–5.1)
Sodium: 137 mmol/L (ref 135–145)
Total Bilirubin: 0.3 mg/dL (ref 0.3–1.2)
Total Protein: 5.5 g/dL — ABNORMAL LOW (ref 6.5–8.1)

## 2022-06-11 LAB — CULTURE, BLOOD (ROUTINE X 2)
Culture: NO GROWTH
Culture: NO GROWTH
Special Requests: ADEQUATE
Special Requests: ADEQUATE

## 2022-06-11 LAB — BPAM RBC
Blood Product Expiration Date: 202308282359
ISSUE DATE / TIME: 202307300905
Unit Type and Rh: 5100

## 2022-06-11 LAB — CBC
HCT: 26.6 % — ABNORMAL LOW (ref 36.0–46.0)
Hemoglobin: 8.8 g/dL — ABNORMAL LOW (ref 12.0–15.0)
MCH: 31.8 pg (ref 26.0–34.0)
MCHC: 33.1 g/dL (ref 30.0–36.0)
MCV: 96 fL (ref 80.0–100.0)
Platelets: 390 10*3/uL (ref 150–400)
RBC: 2.77 MIL/uL — ABNORMAL LOW (ref 3.87–5.11)
RDW: 14 % (ref 11.5–15.5)
WBC: 10.5 10*3/uL (ref 4.0–10.5)
nRBC: 0 % (ref 0.0–0.2)

## 2022-06-11 LAB — C-REACTIVE PROTEIN: CRP: 4.8 mg/dL — ABNORMAL HIGH (ref <1.0)

## 2022-06-11 MED ORDER — ZOLPIDEM TARTRATE 5 MG PO TABS
5.0000 mg | ORAL_TABLET | Freq: Every evening | ORAL | Status: DC | PRN
  Administered 2022-06-12: 5 mg via ORAL
  Filled 2022-06-11: qty 1

## 2022-06-11 MED ORDER — ENOXAPARIN SODIUM 30 MG/0.3ML IJ SOSY
30.0000 mg | PREFILLED_SYRINGE | INTRAMUSCULAR | Status: DC
  Administered 2022-06-12 – 2022-06-15 (×4): 30 mg via SUBCUTANEOUS
  Filled 2022-06-11 (×4): qty 0.3

## 2022-06-11 NOTE — Progress Notes (Signed)
Inpatient Rehab Admissions Coordinator:   Pt.'s husband has filed an appeal of CIR denial with Pt.'s insurance. No insurance decision has been rendered yet.  Clemens Catholic, [place], [place] Admissions Coordinator  [phone] ([place]) [phone] (office)

## 2022-06-11 NOTE — Progress Notes (Signed)
Occupational Therapy Treatment Patient Details Name: Robin Arellano MRN: [id number] DOB: [birth year] Today's Date: 06/11/2022   History of present illness 71 year old female with recent diagnosis of left PCA stroke with eventual discharge inpatient rehab on 7/11 presented back to the medical service due to new onset flulike symptoms, fever of 102.5 F and COVID PCR positivity; with past medical history of fibromyalgia, chronic low back pain status post L4-L5 posterior lumbar fusion 6/15, coronary artery disease (S/P cath 11/2018 mild nonobstructive CAD), hyperlipidemia, chronic kidney disease stage IIIb (baseline Cr 1.6-1.7), 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 Patient was min guard to get to EOB and min assist to donn back brace. Patient ambulated to sink and was able to perform grooming tasks with min guard assist and no seated rest break. Patient performed mobility in room with min guard assist. Patient to continue to be followed by acute OT.    Recommendations for follow up therapy are one component of a multi-disciplinary discharge planning process, led by the attending physician.  Recommendations may be updated based on patient status, additional functional criteria and insurance authorization.    Follow Up Recommendations  Acute inpatient rehab (3hours/day)    Assistance Recommended at Discharge Frequent or constant Supervision/Assistance  Patient can return home with the following  A little help with walking and/or transfers;A little help with bathing/dressing/bathroom;Assistance with cooking/housework;Direct supervision/assist for medications management;Direct supervision/assist for financial management;Assist for transportation;Help with stairs or ramp for entrance   Equipment Recommendations  None recommended by OT    Recommendations for Other Services      Precautions /  Restrictions Precautions Precautions: Back;Fall (spine surgery 04/26/22) Precaution Booklet Issued: No Precaution Comments: able to recall back precautions however unable to demonstrate no twisting or bending without suing Required Braces or Orthoses: Spinal Brace Spinal Brace: Lumbar corset;Applied in sitting position Spinal Brace Comments: LSO in room Restrictions Weight Bearing Restrictions: No       Mobility Bed Mobility Overal bed mobility: Needs Assistance Bed Mobility: Rolling, Sidelying to Sit Rolling: Min guard Sidelying to sit: HOB elevated, Min guard       General bed mobility comments: less assistance for bed mobilty today    Transfers Overall transfer level: Needs assistance Equipment used: Rolling walker (2 wheels) Transfers: Sit to/from Stand Sit to Stand: Min guard           General transfer comment: cues for hand placement to power up and to sit     Balance Overall balance assessment: Needs assistance Sitting-balance support: Feet supported Sitting balance-Leahy Scale: Good     Standing balance support: Single extremity supported, Bilateral upper extremity supported, During functional activity Standing balance-Leahy Scale: Poor Standing balance comment: requires at least one UE support when standing                           ADL either performed or assessed with clinical judgement   ADL Overall ADL's : Needs assistance/impaired     Grooming: Wash/dry hands;Wash/dry face;Oral care;Min guard;Standing Grooming Details (indicate cue type and reason): at sink         Upper Body Dressing : Minimal assistance;Sitting Upper Body Dressing Details (indicate cue type and reason): fro brace and gown,                   General ADL Comments: more mobility today and able to  perform grooming standing    Extremity/Trunk Assessment              Vision       Perception     Praxis      Cognition Arousal/Alertness:  Awake/alert Behavior During Therapy: WFL for tasks assessed/performed, Flat affect Overall Cognitive Status: Impaired/Different from baseline Area of Impairment: Memory, Following commands, Problem solving                     Memory: Decreased recall of precautions, Decreased short-term memory Following Commands: Follows one step commands consistently Safety/Judgement: Decreased awareness of safety Awareness: Emergent Problem Solving: Slow processing, Requires verbal cues General Comments: appeared to demonstrate improved cognition but continues to use humor        Exercises      Shoulder Instructions       General Comments      Pertinent Vitals/ Pain       Pain Assessment Pain Assessment: Faces Faces Pain Scale: Hurts little more Pain Location: low back and L hip Pain Descriptors / Indicators: Aching, Sore, Grimacing, Guarding Pain Intervention(s): Limited activity within patient's tolerance, Monitored during session, Repositioned  Home Living                                          Prior Functioning/Environment              Frequency  Min 2X/week        Progress Toward Goals  OT Goals(current goals can now be found in the care plan section)  Progress towards OT goals: Progressing toward goals  Acute Rehab OT Goals Patient Stated Goal: get better OT Goal Formulation: With patient Time For Goal Achievement: 06/14/22 Potential to Achieve Goals: Good ADL Goals Pt Will Perform Grooming: Independently;standing Pt Will Perform Upper Body Bathing: with modified independence;sitting Pt Will Perform Lower Body Bathing: with modified independence;sit to/from stand Pt Will Perform Upper Body Dressing: Independently;sitting Pt Will Perform Lower Body Dressing: with modified independence;sit to/from stand Pt Will Transfer to Toilet: with modified independence;ambulating;bedside commode Pt Will Perform Toileting - Clothing Manipulation  and hygiene: with modified independence;sit to/from stand Pt Will Perform Tub/Shower Transfer: Shower transfer;with supervision;shower seat;ambulating;rolling walker;grab bars Additional ADL Goal #1: Pt will be Mod I in and OOB for basic ADLs  Plan Discharge plan remains appropriate    Co-evaluation                 AM-PAC OT "6 Clicks" Daily Activity     Outcome Measure   Help from another person eating meals?: None Help from another person taking care of personal grooming?: A Little Help from another person toileting, which includes using toliet, bedpan, or urinal?: A Little Help from another person bathing (including washing, rinsing, drying)?: A Little Help from another person to put on and taking off regular upper body clothing?: A Little Help from another person to put on and taking off regular lower body clothing?: A Little 6 Click Score: 19    End of Session Equipment Utilized During Treatment: Rolling walker (2 wheels);Back brace  OT Visit Diagnosis: Unsteadiness on feet (R26.81);Other abnormalities of gait and mobility (R26.89);Muscle weakness (generalized) (M62.81);Pain;History of falling (Z91.81);Other symptoms and signs involving cognitive function   Activity Tolerance Patient tolerated treatment well   Patient Left in chair;with call bell/phone within reach;with family/visitor present   Nurse Communication Mobility  status        Time: 1219-1243 OT Time Calculation (min): 24 min  Charges: OT General Charges $OT Visit: 1 Visit OT Treatments $Self Care/Home Management : 23-37 mins  Lodema Hong, [place]  Office [place] 06/11/2022, 1:53 PM

## 2022-06-11 NOTE — Progress Notes (Signed)
  Progress Note   Patient: Robin Arellano [id number]:[id number] DOB: 1951-11-03 DOA: 05/26/2022     16 DOS: the patient was seen and examined on 06/11/2022   Brief hospital course: 71 year old woman recent diagnosis left PCA stroke with discharged to inpatient rehab 7/11, readmitted 7/18 to acute care for fever secondary to [place].  Plan for readmission to CIR when bed available.  Assessment and Plan: COVID-19 positive 05/26/22, with associated n/v, now coughing and malaise --Paxlovid stopped 7/18 due to rise in Cr  --Remains stable on room air --Supportive treatments as ordered, encourage IS, encourage prone positioning, encourage mobilization  --CTA chest negative for PE -Recent CXR, clear -UA neg, blood cx neg -Recent increased malaise, cough, and markedly elevated CRP peaking to 34 -Now on steroids and molnupiravir. Improving with CRP down to 4.8 today -recheck cmp and crp in AM   Nausea, vomiting. Acute pancreatitis considered based on elevated lipase and symptoms, but now ruled out with CT. --CT abd/pelvis no acute features -- Presumably secondary to COVID.   -Cont anti-emetic as needed -Recently noted to have diarrhea. Now on PRN imodium   Essential Hypertension --Control improved.  Continue Norvasc, hydralazine, Catapres, chlorthalidone. ' Acute on chronic pain secondary to spondylolisthesis L4-L5 with lumbar radiculopathy --oxycodone   Acute metabolic encephalopathy --multifactorial, consistent with delirium by description, likely secondary to recent stroke, COVID, UTI -- Resolved   UTI --completed treatment -repeat UA neg   Left PCA stroke, CAD --Plavix, aspirin, Lipitor --PT, OT following -CIR has been denied by pt's insurance. Family in process of appeal   AKI on CKD stage IIIb --Baseline creatinine 1.9 --AKI improved   Diarrhea --Likely in setting of COVID.   Hypomagnesemia -repalced  Hypokalemia -within normal range -Repeat bmet in AM  Toxic  metabolic encephalopathy -Noted to be more confused recently -Suspect secondary to high dose steroids -Now off IV steroids, weaned to prednisone      Subjective: States feeling better today  Physical Exam: Vitals:   06/10/22 2156 06/11/22 0500 06/11/22 0519 06/11/22 1121  BP: (!) 141/67  (!) 156/78 (!) 169/69  Pulse: 62  (!) 56 (!) 56  Resp: '18  19 20  '$ Temp: 98.2 F (36.8 C)  98 F (36.7 C) 98.2 F (36.8 C)  TempSrc: Oral  Oral Oral  SpO2: 97%  99% 97%  Weight:  63.8 kg    Height:       General exam: Conversant, in no acute distress Respiratory system: normal chest rise, clear, no audible wheezing Cardiovascular system: regular rhythm, s1-s2 Gastrointestinal system: Nondistended, nontender, pos BS Central nervous system: No seizures, no tremors Extremities: No cyanosis, no joint deformities Skin: No rashes, no pallor Psychiatry: Affect normal // no auditory hallucinations   Data Reviewed:  Labs reviewed: K 3.7, Cr 1.64, CRP 4.8  Family Communication: Pt in room, family at bedside  Disposition: Status is: Inpatient Remains inpatient appropriate because: Severity of illness  Planned Discharge Destination: Skilled nursing facility    Author: Marylu Lund, MD 06/11/2022 1:50 PM  For on call review [email or web address]

## 2022-06-11 NOTE — Progress Notes (Signed)
Physical Therapy Treatment Patient Details Name: Robin Arellano MRN: [id number] DOB: 06-25-1951 Today's Date: 06/11/2022   History of Present Illness 71 year old female with recent diagnosis of left PCA stroke with eventual discharge inpatient rehab on 7/11 presented back to the medical service due to new onset flulike symptoms, fever of 102.5 F and COVID PCR positivity; with past medical history of fibromyalgia, chronic low back pain status post L4-L5 posterior lumbar fusion 6/15, coronary artery disease (S/P cath 11/2018 mild nonobstructive CAD), hyperlipidemia, chronic kidney disease stage IIIb (baseline Cr 1.6-1.7), 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 Pt cognition and command follow much improved. Pt reports feeling better and ready to work with therapy. Pt requests to use bathroom and is able to walk into bathroom today. Pt is limited in safe mobility by L sided weakness, in presence of generalized weakness and decreased endurance. Pt is min guard for bed mobility, min A for sit<>stand from low toilet seat and supervision for ambulation in room with RW. Pt has strong desire to return to [facility] for finishing her therapy, to return to PLOF, PT is in agreement. PT will continue to follow acutely.    Recommendations for follow up therapy are one component of a multi-disciplinary discharge planning process, led by the attending physician.  Recommendations may be updated based on patient status, additional functional criteria and insurance authorization.  Follow Up Recommendations  Acute inpatient rehab (3hours/day)     Assistance Recommended at Discharge Frequent or constant Supervision/Assistance  Patient can return home with the following A little help with walking and/or transfers;Assistance with cooking/housework;Direct supervision/assist for medications management;Direct supervision/assist  for financial management;Assist for transportation;Help with stairs or ramp for entrance;A lot of help with bathing/dressing/bathroom   Equipment Recommendations  Rolling walker (2 wheels);BSC/3in1       Precautions / Restrictions Precautions Precautions: Back;Fall (spine surgery 04/26/22) Precaution Booklet Issued: No Precaution Comments: able to recall back precautions however unable to demonstrate no twisting or bending without suing Required Braces or Orthoses: Spinal Brace Spinal Brace: Lumbar corset;Applied in sitting position Spinal Brace Comments: LSO in room Restrictions Weight Bearing Restrictions: No     Mobility  Bed Mobility Overal bed mobility: Needs Assistance Bed Mobility: Rolling, Sidelying to Sit Rolling: Min guard Sidelying to sit: HOB elevated, Min guard   Sit to supine: Min guard   General bed mobility comments: no physical assist needed, vc for sequencing    Transfers Overall transfer level: Needs assistance Equipment used: Rolling walker (2 wheels) Transfers: Sit to/from Stand Sit to Stand: Min guard, Min assist           General transfer comment: min guard for power up and self steadying from bed, min A for power up from lower toilet seat, vc for hand placement on grab bar and L knee to push up to standing, min A for power up    Ambulation/Gait Ambulation/Gait assistance: Supervision Gait Distance (Feet): 100 Feet Assistive device: Rolling walker (2 wheels) Gait Pattern/deviations: Step-through pattern, Step-to pattern, Decreased stance time - left, Decreased weight shift to left, Decreased step length - right, Shuffle Gait velocity: slowed especially with limitation to ambulation in room Gait velocity interpretation: <1.31 ft/sec, indicative of household ambulator   General Gait Details: supervision for safety and IV pole management, pt with decreasing strength on L side with distance, vc for increased L knee flexion for swing through  Balance Overall balance assessment: Needs assistance Sitting-balance support: Feet supported Sitting balance-Leahy Scale: Good     Standing balance support: Single extremity supported, Bilateral upper extremity supported, During functional activity Standing balance-Leahy Scale: Poor Standing balance comment: requires at least one UE support when standing                            Cognition Arousal/Alertness: Awake/alert Behavior During Therapy: WFL for tasks assessed/performed, Flat affect Overall Cognitive Status: Impaired/Different from baseline Area of Impairment: Memory, Following commands, Problem solving                     Memory: Decreased recall of precautions, Decreased short-term memory Following Commands: Follows multi-step commands with increased time Safety/Judgement: Decreased awareness of safety Awareness: Emergent Problem Solving: Slow processing, Requires verbal cues General Comments: improved cognition, especially awareness of situation           General Comments General comments (skin integrity, edema, etc.): VSS on RA, breathing improved      Pertinent Vitals/Pain Pain Assessment Pain Assessment: Faces Faces Pain Scale: Hurts little more Pain Location: low back and L hip Pain Descriptors / Indicators: Aching, Sore, Grimacing, Guarding Pain Intervention(s): Limited activity within patient's tolerance, Monitored during session, Repositioned     PT Goals (current goals can now be found in the care plan section) Acute Rehab PT Goals PT Goal Formulation: With patient Time For Goal Achievement: 06/25/22 Potential to Achieve Goals: Good Progress towards PT goals: Progressing toward goals    Frequency    Min 4X/week      PT Plan Current plan remains appropriate       AM-PAC PT "6 Clicks" Mobility   Outcome Measure  Help needed turning from your back to your side while in a flat bed without using bedrails?: A Little Help  needed moving from lying on your back to sitting on the side of a flat bed without using bedrails?: A Little Help needed moving to and from a bed to a chair (including a wheelchair)?: A Little Help needed standing up from a chair using your arms (e.g., wheelchair or bedside chair)?: A Little Help needed to walk in hospital room?: A Little Help needed climbing 3-5 steps with a railing? : A Lot 6 Click Score: 17    End of Session Equipment Utilized During Treatment: Back brace Activity Tolerance: Patient tolerated treatment well Patient left: with call bell/phone within reach;with family/visitor present;in chair;with chair alarm set Nurse Communication: Mobility status PT Visit Diagnosis: Unsteadiness on feet (R26.81);Muscle weakness (generalized) (M62.81);History of falling (Z91.81);Repeated falls (R29.6);Difficulty in walking, not elsewhere classified (R26.2);Dizziness and giddiness (R42)     Time: 3545-6256 PT Time Calculation (min) (ACUTE ONLY): 34 min  Charges:  $Gait Training: 8-22 mins $Therapeutic Activity: 8-22 mins                     Maksym Pfiffner B. Migdalia Dk PT, DPT Acute Rehabilitation Services Please use secure chat or  Call Office [phone]    [place] 06/11/2022, 3:14 PM

## 2022-06-12 LAB — COMPREHENSIVE METABOLIC PANEL
ALT: 24 U/L (ref 0–44)
AST: 33 U/L (ref 15–41)
Albumin: 2 g/dL — ABNORMAL LOW (ref 3.5–5.0)
Alkaline Phosphatase: 166 U/L — ABNORMAL HIGH (ref 38–126)
Anion gap: 9 (ref 5–15)
BUN: 26 mg/dL — ABNORMAL HIGH (ref 8–23)
CO2: 26 mmol/L (ref 22–32)
Calcium: 8.2 mg/dL — ABNORMAL LOW (ref 8.9–10.3)
Chloride: 101 mmol/L (ref 98–111)
Creatinine, Ser: 1.47 mg/dL — ABNORMAL HIGH (ref 0.44–1.00)
GFR, Estimated: 38 mL/min — ABNORMAL LOW (ref >60)
Glucose, Bld: 114 mg/dL — ABNORMAL HIGH (ref 70–99)
Potassium: 3.9 mmol/L (ref 3.5–5.1)
Sodium: 136 mmol/L (ref 135–145)
Total Bilirubin: 0.6 mg/dL (ref 0.3–1.2)
Total Protein: 5.1 g/dL — ABNORMAL LOW (ref 6.5–8.1)

## 2022-06-12 LAB — CBC
HCT: 24.5 % — ABNORMAL LOW (ref 36.0–46.0)
Hemoglobin: 8.2 g/dL — ABNORMAL LOW (ref 12.0–15.0)
MCH: 32 pg (ref 26.0–34.0)
MCHC: 33.5 g/dL (ref 30.0–36.0)
MCV: 95.7 fL (ref 80.0–100.0)
Platelets: 358 10*3/uL (ref 150–400)
RBC: 2.56 MIL/uL — ABNORMAL LOW (ref 3.87–5.11)
RDW: 14.2 % (ref 11.5–15.5)
WBC: 8.6 10*3/uL (ref 4.0–10.5)
nRBC: 0 % (ref 0.0–0.2)

## 2022-06-12 LAB — C-REACTIVE PROTEIN: CRP: 3.8 mg/dL — ABNORMAL HIGH (ref <1.0)

## 2022-06-12 MED ORDER — TRAZODONE HCL 50 MG PO TABS
50.0000 mg | ORAL_TABLET | Freq: Every evening | ORAL | Status: DC | PRN
Start: 2022-06-12 — End: 2022-06-16
  Administered 2022-06-12 – 2022-06-14 (×3): 50 mg via ORAL
  Filled 2022-06-12 (×3): qty 1

## 2022-06-12 NOTE — Progress Notes (Signed)
Inpatient Rehab Admissions Coordinator:    No word on insurance appeal-- Pt. Is also ambulating with supervision level, so potentially too high level for CIR.   Clemens Catholic, [place], [place] Admissions Coordinator  [phone] ([place]) [phone] (office)

## 2022-06-12 NOTE — Progress Notes (Signed)
  Progress Note   Patient: Robin Arellano TGG:[id number] DOB: May 20, 1951 DOA: 05/26/2022     17 DOS: the patient was seen and examined on 06/12/2022   Brief hospital course: 71 year old woman recent diagnosis left PCA stroke with discharged to inpatient rehab 7/11, readmitted 7/18 to acute care for fever secondary to [place].  Plan for readmission to CIR when bed available.  Assessment and Plan: COVID-19 positive 05/26/22, with associated n/v, now coughing and malaise --Paxlovid stopped 7/18 due to rise in Cr  --Remains stable on room air --Supportive treatments as ordered, encourage IS, encourage prone positioning, encourage mobilization  --CTA chest negative for PE -Recent CXR, clear -UA neg, blood cx neg -Recent increased malaise, cough, and markedly elevated CRP peaking to 34 -Now on steroids and molnupiravir. Improving with CRP down to 3.8 today -recheck cmp and crp in AM   Nausea, vomiting. Acute pancreatitis considered based on elevated lipase and symptoms, but now ruled out with CT. --CT abd/pelvis no acute features -- Presumably secondary to COVID.   -Cont anti-emetic as needed -Now resolved   Essential Hypertension --Control improved.  Continue Norvasc, hydralazine, Catapres, chlorthalidone. ' Acute on chronic pain secondary to spondylolisthesis L4-L5 with lumbar radiculopathy --cont oxycodone as needed   Acute metabolic encephalopathy --multifactorial, consistent with delirium by description, likely secondary to recent stroke, COVID, UTI -- Resolved   UTI --completed treatment -repeat UA neg   Left PCA stroke, CAD --Plavix, aspirin, Lipitor --PT, OT following -CIR has been denied by pt's insurance. Family in process of appeal   AKI on CKD stage IIIb --Baseline creatinine 1.9 --Cr improved to 1.47, hold further IVF   Diarrhea --Likely in setting of COVID. -Much improved   Hypomagnesemia -repalced  Hypokalemia -within normal range -Repeat bmet in  AM  Toxic metabolic encephalopathy -Noted to be more confused recently -Suspect secondary to high dose steroids -Now weaned to PO prednisone with improvement in mentation -Have d/c ambien  Anemia -Recent hgb trended down to 6.9 -No obvious source of bleed identified thus far -improved with 1 unit PRBC with hgb stable thus far -Repeat cbc in AM      Subjective: Reports feeling better today  Physical Exam: Vitals:   06/11/22 2047 06/12/22 0525 06/12/22 0907 06/12/22 1412  BP: 137/63 (!) 174/74 (!) 164/72 (!) 132/59  Pulse: 65 (!) 57    Resp: 18 16    Temp: 98.4 F (36.9 C) 97.6 F (36.4 C)    TempSrc:  Oral    SpO2: 94% 94%    Weight:      Height:       General exam: Awake, laying in bed, in nad Respiratory system: Normal respiratory effort, no wheezing Cardiovascular system: regular rate, s1, s2 Gastrointestinal system: Soft, nondistended, positive BS Central nervous system: CN2-12 grossly intact, strength intact Extremities: Perfused, no clubbing Skin: Normal skin turgor, no notable skin lesions seen Psychiatry: Mood normal // no visual hallucinations   Data Reviewed:  Labs reviewed: Na 136, K 3.9, Cr 1.47, Hgb 8.2  Family Communication: Pt in room, family at bedside  Disposition: Status is: Inpatient Remains inpatient appropriate because: Severity of illness  Planned Discharge Destination: Skilled nursing facility    Author: Marylu Lund, MD 06/12/2022 3:58 PM  For on call review [email or web address].

## 2022-06-12 NOTE — TOC Progression Note (Signed)
Transition of Care [facility]) - Initial/Assessment Note    Patient Details  Name: Robin Arellano MRN: [id number] Date of Birth: 10/12/1951  Transition of Care [facility]) CM/SW Contact:    Milinda Antis, LCSWA Phone Number: 06/12/2022, 10:16 AM  Clinical Narrative:                 Patient declined SNF and family appealed the insurance company's denial of CIR.  Still awaiting appeal determination.  TOC will continue to follow.          Patient Goals and CMS Choice        Expected Discharge Plan and Services                                                Prior Living Arrangements/Services                       Activities of Daily Living Home Assistive Devices/Equipment: None ADL Screening (condition at time of admission) Patient's cognitive ability adequate to safely complete daily activities?: Yes Is the patient deaf or have difficulty hearing?: No Does the patient have difficulty seeing, even when wearing glasses/contacts?: No Does the patient have difficulty concentrating, remembering, or making decisions?: No Patient able to express need for assistance with ADLs?: Yes Does the patient have difficulty dressing or bathing?: Yes Independently performs ADLs?: Yes (appropriate for developmental age) Does the patient have difficulty walking or climbing stairs?: Yes Weakness of Legs: Both Weakness of Arms/Hands: None  Permission Sought/Granted                  Emotional Assessment              Admission diagnosis:  COVID-19 virus infection [U07.1] Patient Active Problem List   Diagnosis Date Noted   Nausea & vomiting 06/01/2022   Chronic kidney disease, stage 3b ([place]) 05/27/2022   Mixed hyperlipidemia 05/27/2022   Pressure injury of skin 05/27/2022   COVID-19 virus infection 05/26/2022   Hyponatremia    Acute left PCA stroke ([place]) 05/22/2022   Hypertensive emergency    Pseudoaneurysm ([place]) 05/14/2022   Spondylolisthesis at L4-L5  level 04/26/2022   Hyperlipidemia 11/25/2020   Essential hypertension 07/11/2020   Common bile duct dilation    Acute pancreatitis 04/01/2020   Anxiety    Hypokalemia    Mild renal insufficiency    Elevated troponin    Hypertensive urgency    Elevated liver enzymes    Abnormal magnetic resonance imaging of abdomen    Coronary artery disease involving native coronary artery of native heart without angina pectoris    Chest pain    Herpes zoster without complication 64/33/2951   Abdominal pain 02/07/2018   Abdominal pain, epigastric 02/07/2018   Low back pain 07/09/2013   Neck pain 07/09/2013   Headache(784.0) 07/09/2013   Fibromyalgia 07/09/2013   PCP:  Sandi Mariscal, MD Pharmacy:   [facility] [place], [place] [address] [place] [address] [place] [id number] [place] [address] Phone: [phone] Fax: [phone]     Social Determinants of Health (SDOH) Interventions    Readmission Risk Interventions     No data to display

## 2022-06-13 NOTE — Progress Notes (Signed)
Occupational Therapy Treatment Patient Details Name: Robin Arellano MRN: [id number] DOB: July 22, 1951 Today's Date: 06/13/2022   History of present illness 71 year old female with recent diagnosis of left PCA stroke with eventual discharge inpatient rehab on 7/11 presented back to the medical service due to new onset flulike symptoms, fever of 102.5 F and COVID PCR positivity; with past medical history of fibromyalgia, chronic low back pain status post L4-L5 posterior lumbar fusion 6/15, coronary artery disease (S/P cath 11/2018 mild nonobstructive CAD), hyperlipidemia, chronic kidney disease stage IIIb (baseline Cr 1.6-1.7), 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 Patient ambulated to [facility] across room with min guard and was able to perform toilet hygiene with setup. Patient performed grooming tasks standing at sink with supervision from min guard assist. Patient instructed in AROM exercises while seated. Acute OT to continue to follow.    Recommendations for follow up therapy are one component of a multi-disciplinary discharge planning process, led by the attending physician.  Recommendations may be updated based on patient status, additional functional criteria and insurance authorization.    Follow Up Recommendations  Acute inpatient rehab (3hours/day)    Assistance Recommended at Discharge Frequent or constant Supervision/Assistance  Patient can return home with the following  A little help with walking and/or transfers;A little help with bathing/dressing/bathroom;Assistance with cooking/housework;Direct supervision/assist for medications management;Direct supervision/assist for financial management;Assist for transportation;Help with stairs or ramp for entrance   Equipment Recommendations  None recommended by OT    Recommendations for Other Services      Precautions / Restrictions  Precautions Precautions: Fall;Back (spine surgery 04/26/22) Precaution Booklet Issued: No Precaution Comments: able to recall back precautions Required Braces or Orthoses: Spinal Brace Spinal Brace: Lumbar corset;Applied in sitting position Spinal Brace Comments: LSO in room Restrictions Weight Bearing Restrictions: No Other Position/Activity Restrictions: Brace in room - LSO       Mobility Bed Mobility Overal bed mobility: Needs Assistance             General bed mobility comments: up in recliner    Transfers Overall transfer level: Needs assistance Equipment used: Rolling walker (2 wheels) Transfers: Sit to/from Stand Sit to Stand: Min guard           General transfer comment: min guard to power up and for safety with transfer to [facility] and back to recliner     Balance Overall balance assessment: Needs assistance Sitting-balance support: Feet supported Sitting balance-Leahy Scale: Good     Standing balance support: Single extremity supported, Bilateral upper extremity supported, During functional activity Standing balance-Leahy Scale: Poor Standing balance comment: requires at least one UE support when standing while perform grooming tasks                           ADL either performed or assessed with clinical judgement   ADL Overall ADL's : Needs assistance/impaired     Grooming: Wash/dry hands;Wash/dry face;Oral care;Supervision/safety;Standing Grooming Details (indicate cue type and reason): at sink                 Toilet Transfer: Min guard;Ambulation;BSC/3in1;Rolling walker (2 wheels) Toilet Transfer Details (indicate cue type and reason): ambulated to [facility] across room Toileting- Clothing Manipulation and Hygiene: Set up;Sitting/lateral lean Toileting - Clothing Manipulation Details (indicate cue type and reason): performed toilet hygiene seated       General ADL Comments: improved from  min guard to supervision for grooming standing  at sink    Extremity/Trunk Assessment              Vision       Perception     Praxis      Cognition Arousal/Alertness: Awake/alert Behavior During Therapy: WFL for tasks assessed/performed, Flat affect Overall Cognitive Status: Impaired/Different from baseline Area of Impairment: Memory, Following commands, Problem solving                     Memory: Decreased short-term memory Following Commands: Follows multi-step commands with increased time Safety/Judgement: Decreased awareness of safety Awareness: Emergent Problem Solving: Slow processing, Requires verbal cues General Comments: Patient states she feels like she is thinking more clearer        Exercises Exercises: General Upper Extremity General Exercises - Upper Extremity Shoulder Flexion: Both, 10 reps, Seated, AROM Shoulder ABduction: AROM, Both, 10 reps, Seated    Shoulder Instructions       General Comments VSS on RA, congestion sounds to be clearing    Pertinent Vitals/ Pain       Pain Assessment Pain Assessment: Faces Faces Pain Scale: Hurts little more Pain Location: low back and L hip Pain Descriptors / Indicators: Aching, Sore, Grimacing, Guarding Pain Intervention(s): Monitored during session, Repositioned  Home Living                                          Prior Functioning/Environment              Frequency  Min 2X/week        Progress Toward Goals  OT Goals(current goals can now be found in the care plan section)  Progress towards OT goals: Progressing toward goals  Acute Rehab OT Goals Patient Stated Goal: get better OT Goal Formulation: With patient Time For Goal Achievement: 06/14/22 Potential to Achieve Goals: Good ADL Goals Pt Will Perform Grooming: Independently;standing Pt Will Perform Upper Body Bathing: with modified independence;sitting Pt Will Perform Lower Body Bathing: with modified independence;sit to/from stand Pt Will  Perform Upper Body Dressing: Independently;sitting Pt Will Perform Lower Body Dressing: with modified independence;sit to/from stand Pt Will Transfer to Toilet: with modified independence;ambulating;bedside commode Pt Will Perform Toileting - Clothing Manipulation and hygiene: with modified independence;sit to/from stand Pt Will Perform Tub/Shower Transfer: Shower transfer;with supervision;shower seat;ambulating;rolling walker;grab bars Additional ADL Goal #1: Pt will be Mod I in and OOB for basic ADLs  Plan Discharge plan remains appropriate    Co-evaluation                 AM-PAC OT "6 Clicks" Daily Activity     Outcome Measure   Help from another person eating meals?: None Help from another person taking care of personal grooming?: A Little Help from another person toileting, which includes using toliet, bedpan, or urinal?: A Little Help from another person bathing (including washing, rinsing, drying)?: A Little Help from another person to put on and taking off regular upper body clothing?: A Little Help from another person to put on and taking off regular lower body clothing?: A Little 6 Click Score: 19    End of Session Equipment Utilized During Treatment: Rolling walker (2 wheels);Back brace  OT Visit Diagnosis: Unsteadiness on feet (R26.81);Other abnormalities of gait and mobility (R26.89);Muscle weakness (generalized) (M62.81);Pain;History of falling (Z91.81);Other symptoms and signs involving cognitive function  Activity Tolerance Patient tolerated treatment well   Patient Left in chair;with call bell/phone within reach;with family/visitor present   Nurse Communication Mobility status        Time: 1134-1200 OT Time Calculation (min): 26 min  Charges: OT General Charges $OT Visit: 1 Visit OT Treatments $Self Care/Home Management : 8-22 mins $Therapeutic Exercise: 8-22 mins  Lodema Hong, OTA Acute Rehabilitation Services  Office [phone]   Trixie Dredge 06/13/2022, 12:43 PM

## 2022-06-13 NOTE — Progress Notes (Signed)
Physical Therapy Treatment Patient Details Name: Robin Arellano MRN: [id number] DOB: 12-17-1950 Today's Date: 06/13/2022   History of Present Illness 71 year old female with recent diagnosis of left PCA stroke with eventual discharge inpatient rehab on 7/11 presented back to the medical service due to new onset flulike symptoms, fever of 102.5 F and COVID PCR positivity; with past medical history of fibromyalgia, chronic low back pain status post L4-L5 posterior lumbar fusion 6/15, coronary artery disease (S/P cath 11/2018 mild nonobstructive CAD), hyperlipidemia, chronic kidney disease stage IIIb (baseline Cr 1.6-1.7), 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 Focus of session on LE strengthening, for improved gait. Pt wishes to return to PLOF of independence in gait without AD. PT feels AIR offers her the best opportunity to regain full independence with L LE strength and gait.      Recommendations for follow up therapy are one component of a multi-disciplinary discharge planning process, led by the attending physician.  Recommendations may be updated based on patient status, additional functional criteria and insurance authorization.  Follow Up Recommendations  Acute inpatient rehab (3hours/day)     Assistance Recommended at Discharge Frequent or constant Supervision/Assistance  Patient can return home with the following A little help with walking and/or transfers;Assistance with cooking/housework;Direct supervision/assist for medications management;Direct supervision/assist for financial management;Assist for transportation;Help with stairs or ramp for entrance;A lot of help with bathing/dressing/bathroom   Equipment Recommendations  Rolling walker (2 wheels);BSC/3in1       Precautions / Restrictions  Precautions Precautions: Back;Fall (spine surgery 04/26/22) Precaution Booklet Issued: No Precaution Comments: able to recall back precautions however unable to demonstrate no twisting or bending without suing Required Braces or Orthoses: Spinal Brace Spinal Brace: Lumbar corset;Applied in sitting position Spinal Brace Comments: LSO in room Restrictions Weight Bearing Restrictions: No Other Position/Activity Restrictions: Brace in room - LSO     Mobility  Bed Mobility Overal bed mobility: Needs Assistance Bed Mobility: Rolling, Sidelying to Sit Rolling: Min guard Sidelying to sit: HOB elevated, Min guard   Sit to supine: Min guard   General bed mobility comments: no physical assist needed, vc for sequencing    Transfers Overall transfer level: Needs assistance Equipment used: Rolling walker (2 wheels) Transfers: Sit to/from Stand Sit to Stand: Min guard, Min assist           General transfer comment: min guard for power up and steadying in RW, vc for scooting hips forward, and correct hand placement for power up    Ambulation/Gait Ambulation/Gait assistance: Min guard Gait Distance (Feet): 50 Feet Assistive device: Rolling walker (2 wheels) Gait Pattern/deviations: Step-through pattern, Step-to pattern, Decreased stance time - left, Decreased weight shift to left, Decreased step length - right, Shuffle Gait velocity: slowed especially with limitation to ambulation in room Gait velocity interpretation: <1.31 ft/sec, indicative of household ambulator   General Gait Details: limited by decreased L sided weakness, as ambulation progresses pt with decreased hip, knee and dorsiflexion, vc however pt reports increased numbness and weakness         Balance Overall balance assessment: Needs assistance Sitting-balance support: Feet supported Sitting balance-Leahy Scale: Good     Standing balance support: Single extremity supported, Bilateral upper extremity supported,  During functional activity Standing balance-Leahy Scale: Poor Standing balance comment: requires at least one UE support when standing  Cognition Arousal/Alertness: Awake/alert Behavior During Therapy: WFL for tasks assessed/performed, Flat affect Overall Cognitive Status: Impaired/Different from baseline Area of Impairment: Memory, Following commands, Problem solving                     Memory: Decreased short-term memory Following Commands: Follows multi-step commands with increased time Safety/Judgement: Decreased awareness of safety Awareness: Emergent Problem Solving: Slow processing, Requires verbal cues General Comments: cognition continues to clear        Exercises General Exercises - Lower Extremity Long Arc Quad: AROM, Left, 10 reps, Both, Seated Hip ABduction/ADduction: AROM, Left, 10 reps, Both, Seated Hip Flexion/Marching: AROM, Left, 10 reps, Both, Seated    General Comments General comments (skin integrity, edema, etc.): VSS on RA, congestion sounds to be clearing      Pertinent Vitals/Pain Pain Assessment Pain Assessment: Faces Faces Pain Scale: Hurts little more Pain Location: low back and L hip Pain Descriptors / Indicators: Aching, Sore, Grimacing, Guarding Pain Intervention(s): Limited activity within patient's tolerance, Monitored during session, Repositioned     PT Goals (current goals can now be found in the care plan section) Acute Rehab PT Goals PT Goal Formulation: With patient Time For Goal Achievement: 06/25/22 Potential to Achieve Goals: Good Progress towards PT goals: Progressing toward goals    Frequency    Min 4X/week      PT Plan Current plan remains appropriate       AM-PAC PT "6 Clicks" Mobility   Outcome Measure  Help needed turning from your back to your side while in a flat bed without using bedrails?: A Little Help needed moving from lying on your back to sitting on the  side of a flat bed without using bedrails?: A Little Help needed moving to and from a bed to a chair (including a wheelchair)?: A Little Help needed standing up from a chair using your arms (e.g., wheelchair or bedside chair)?: A Little Help needed to walk in hospital room?: A Little Help needed climbing 3-5 steps with a railing? : A Lot 6 Click Score: 17    End of Session Equipment Utilized During Treatment: Back brace Activity Tolerance: Patient tolerated treatment well Patient left: with call bell/phone within reach;with family/visitor present;in chair Nurse Communication: Mobility status PT Visit Diagnosis: Unsteadiness on feet (R26.81);Muscle weakness (generalized) (M62.81);History of falling (Z91.81);Repeated falls (R29.6);Difficulty in walking, not elsewhere classified (R26.2);Dizziness and giddiness (R42)     Time: 0912-1002 PT Time Calculation (min) (ACUTE ONLY): 50 min  Charges:  $Gait Training: 8-22 mins $Therapeutic Exercise: 8-22 mins $Therapeutic Activity: 8-22 mins                     Chenita Ruda B. Migdalia Dk PT, DPT Acute Rehabilitation Services Please use secure chat or  Call Office [phone]    [place] 06/13/2022, 10:16 AM

## 2022-06-13 NOTE — Progress Notes (Signed)
Speech Language Pathology Treatment: Cognitive-Linquistic  Patient Details Name: Robin Arellano MRN: [id number] DOB: 10-20-1951 Today's Date: 06/13/2022 Time: 1350-1409 SLP Time Calculation (min) (ACUTE ONLY): 19 min  Assessment / Plan / Recommendation Clinical Impression  Pt falling asleep upon SLP arrival, however she consented to treatment of cognitive functions this date. She reported difficulties with word-retrieval, especially during conversation with her spouse. Given confrontational, responsive and divergent naming tasks, she required some extra processing time intermittently, but was able to complete all tasks with 100% accuracy. During informal conversation, she had x1 occasion of word- finding and with training/cues for circumlocution strategy she was able to provide word to clinician. When given verbal scenarios to assess/tx problem solving, pt provided effects of problems, solutions to problems and rationale for choices with 70% accuracy provided mod verbal/question cues. At end of session, pt reported concerns for poor recall of names. Educated pt regarding use of external memory aids, such as note taking, in order to improve recall of this information from day to day. Pt expressed understanding and agreement. SLP to continue f/u for treatment of cognitive functions.    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  Recommendations may be updated based on patient status, additional functional criteria and insurance authorization.    Recommendations                   Follow Up Recommendations: Acute inpatient rehab (3hours/day) Assistance recommended at discharge: Frequent or constant Supervision/Assistance SLP Visit Diagnosis: Cognitive communication deficit (R41.841) Attention and concentration deficit following: Cerebral infarction Plan: Continue with current plan of care            Ellwood Dense, [place], [place] Office Number: [phone]  Acie Fredrickson  06/13/2022, 2:15 PM

## 2022-06-13 NOTE — Progress Notes (Signed)
PROGRESS NOTE    Robin Arellano  AYT:[id number] DOB: 19-Nov-1950 DOA: 05/26/2022 PCP: Sandi Mariscal, MD   Brief Narrative:  71 year old woman recent diagnosis left PCA stroke with discharged to inpatient rehab 7/11, readmitted 7/18 to acute care for fever secondary to [place].  Plan for readmission to CIR when bed available.    Assessment & Plan:   Principal Problem:   COVID-19 virus infection Active Problems:   Essential hypertension   Acute left PCA stroke (HCC)   Coronary artery disease involving native coronary artery of native heart without angina pectoris   Chronic kidney disease, stage 3b (HCC)   Mixed hyperlipidemia   Low back pain   Pressure injury of skin   Nausea & vomiting  Assessment and Plan: COVID-19 positive 05/26/22, with associated n/v, now coughing and malaise --Paxlovid stopped 7/18 due to rise in Cr  --Remains stable on room air --Supportive treatments as ordered, encourage IS, encourage prone positioning, encourage mobilization  --CTA chest negative for PE -Recent CXR, clear -UA neg, blood cx neg -Recent increased malaise, cough, and markedly elevated CRP peaking to 34 -Now on steroids   Nausea, vomiting. Acute pancreatitis considered based on elevated lipase and symptoms, but now ruled out with CT. --CT abd/pelvis no acute features -- Presumably secondary to COVID.   -Cont anti-emetic as needed -Now resolved   Essential Hypertension --Control improved.  Continue Norvasc, hydralazine, Catapres, chlorthalidone. ' Acute on chronic pain secondary to spondylolisthesis L4-L5 with lumbar radiculopathy --cont oxycodone as needed   Acute metabolic encephalopathy --multifactorial, consistent with delirium by description, likely secondary to recent stroke, COVID, UTI -- Resolved   UTI --completed treatment -repeat UA neg   Left PCA stroke, CAD --Plavix, aspirin, Lipitor --PT, OT following -CIR has been denied by pt's insurance. Family in process of  appeal   AKI on CKD stage IIIb --Baseline creatinine 1.9 --Cr improved to 1.47, hold further IVF   Diarrhea --Likely in setting of COVID. -Much improved    Toxic metabolic encephalopathy -Noted to be more confused recently -Suspect secondary to high dose steroids -Now weaned to PO prednisone with improvement in mentation -Have d/c ambien   Anemia -Recent hgb trended down to 6.9 -No obvious source of bleed identified thus far -improved with 1 unit PRBC with hgb stable thus far   DVT prophylaxis:Lovenox Code Status: Full Family Communication: None at bedside Disposition Plan:  Status is: Inpatient Remains inpatient appropriate because: IV medications   Nutritional Assessment:  The patient's BMI is: Body mass index is 24.92 kg/m.Marland Kitchen  Seen by dietician.  I agree with the assessment and plan as outlined below:  Nutrition Status: Nutrition Problem: Inadequate oral intake Etiology: acute illness Signs/Symptoms: per patient/family report Interventions: Refer to RD note for recommendations  .    Skin Assessment:  I have examined the patient's skin and I agree with the wound assessment as performed by the wound care RN as outlined below:  Pressure Injury 05/26/22 Buttocks Left Stage 2 -  Partial thickness loss of dermis presenting as a shallow open injury with a red, pink wound bed without slough. red, pink (Active)  05/26/22 2045  Location: Buttocks  Location Orientation: Left  Staging: Stage 2 -  Partial thickness loss of dermis presenting as a shallow open injury with a red, pink wound bed without slough.  Wound Description (Comments): red, pink  Present on Admission: Yes  Dressing Type None 06/11/22 2200    Consultants:  None  Procedures:  None  Antimicrobials:  Anti-infectives (  From admission, onward)    Start     Dose/Rate Route Frequency Ordered Stop   06/07/22 1930  molnupiravir EUA (LAGEVRIO) capsule 800 mg        4 capsule Oral 2 times daily  06/07/22 1827 06/12/22 0913   05/28/22 1300  cefTRIAXone (ROCEPHIN) 1 g in sodium chloride 0.9 % 100 mL IVPB  Status:  Discontinued        1 g 200 mL/hr over 30 Minutes Intravenous Every 24 hours 05/28/22 1209 05/30/22 2118   05/27/22 0115  nirmatrelvir/ritonavir EUA (renal dosing) (PAXLOVID) 2 tablet  Status:  Discontinued        2 tablet Oral 2 times daily 05/27/22 0017 05/29/22 1305       Subjective: Patient seen and evaluated today with no new acute complaints or concerns. No acute concerns or events noted overnight. Awaiting insurance appeal for CIR. No dyspnea or cough noted.  Objective: Vitals:   06/12/22 2203 06/12/22 2205 06/13/22 0412 06/13/22 0859  BP: (!) 160/86 (!) 160/86 (!) 167/83 (!) 161/64  Pulse:  65 62 66  Resp:  '16 16 17  '$ Temp:  97.6 F (36.4 C) 98.4 F (36.9 C) 98.2 F (36.8 C)  TempSrc:  Oral Oral   SpO2:  95% 93% 96%  Weight:      Height:        Intake/Output Summary (Last 24 hours) at 06/13/2022 1010 Last data filed at 06/13/2022 0800 Gross per 24 hour  Intake 916.06 ml  Output --  Net 916.06 ml   Filed Weights   06/06/22 0516 06/07/22 0559 06/11/22 0500  Weight: 63.7 kg 63.2 kg 63.8 kg    Examination:  General exam: Appears calm and comfortable  Respiratory system: Clear to auscultation. Respiratory effort normal. RA Cardiovascular system: S1 & S2 heard, RRR.  Gastrointestinal system: Abdomen is soft Central nervous system: Alert and awake Extremities: No edema Skin: No significant lesions noted Psychiatry: Flat affect.    Data Reviewed: I have personally reviewed following labs and imaging studies  CBC: Recent Labs  Lab 06/08/22 0455 06/09/22 0228 06/10/22 0441 06/10/22 1507 06/11/22 0249 06/12/22 0146  WBC 8.0 10.7* 8.9  --  10.5 8.6  HGB 7.4* 7.0* 6.9* 7.9* 8.8* 8.2*  HCT 21.7* 21.0* 20.3* 22.9* 26.6* 24.5*  MCV 94.3 95.9 95.8  --  96.0 95.7  PLT 384 394 378  --  390 379   Basic Metabolic Panel: Recent Labs  Lab  06/07/22 0802 06/08/22 0455 06/09/22 0228 06/10/22 0441 06/11/22 0249 06/12/22 0146  NA 131* 131* 135 136 137 136  K 2.9* 3.2* 3.9 3.8 3.7 3.9  CL 96* 97* 98 103 103 101  CO2 '23 25 25 24 26 26  '$ GLUCOSE 97 160* 138* 139* 101* 114*  BUN '9 11 18 20 '$ 26* 26*  CREATININE 1.31* 1.34* 1.39* 1.41* 1.64* 1.47*  CALCIUM 8.4* 8.4* 8.4* 8.4* 8.4* 8.2*  MG 2.0  --   --   --   --   --    GFR: Estimated Creatinine Clearance: 31.6 mL/min (A) (by C-G formula based on SCr of 1.47 mg/dL (H)). Liver Function Tests: Recent Labs  Lab 06/08/22 0455 06/09/22 0228 06/10/22 0441 06/11/22 0249 06/12/22 0146  AST '16 25 26 27 '$ 33  ALT '13 14 18 20 24  '$ ALKPHOS 124 110 157* 153* 166*  BILITOT 0.5 0.2* 0.5 0.3 0.6  PROT 6.3* 5.6* 5.7* 5.5* 5.1*  ALBUMIN 2.1* 2.0* 2.1* 2.0* 2.0*   No results for input(s): "  LIPASE", "AMYLASE" in the last 168 hours. No results for input(s): "AMMONIA" in the last 168 hours. Coagulation Profile: No results for input(s): "INR", "PROTIME" in the last 168 hours. Cardiac Enzymes: No results for input(s): "CKTOTAL", "CKMB", "CKMBINDEX", "TROPONINI" in the last 168 hours. BNP (last 3 results) No results for input(s): "PROBNP" in the last 8760 hours. HbA1C: No results for input(s): "HGBA1C" in the last 72 hours. CBG: No results for input(s): "GLUCAP" in the last 168 hours. Lipid Profile: No results for input(s): "CHOL", "HDL", "LDLCALC", "TRIG", "CHOLHDL", "LDLDIRECT" in the last 72 hours. Thyroid Function Tests: No results for input(s): "TSH", "T4TOTAL", "FREET4", "T3FREE", "THYROIDAB" in the last 72 hours. Anemia Panel: Recent Labs    06/10/22 1507  VITAMINB12 863  FOLATE 6.2  TIBC 154*  IRON 61   Sepsis Labs: Recent Labs  Lab 06/08/22 0455 06/09/22 0228 06/10/22 0441  PROCALCITON 0.30 0.16 <0.10    Recent Results (from the past 240 hour(s))  Culture, blood (Routine X 2) w Reflex to ID Panel     Status: None   Collection Time: 06/06/22 10:50 AM   Specimen:  BLOOD  Result Value Ref Range Status   Specimen Description BLOOD RIGHT ANTECUBITAL  Final   Special Requests   Final    BOTTLES DRAWN AEROBIC AND ANAEROBIC Blood Culture adequate volume   Culture   Final    NO GROWTH 5 DAYS Performed at [facility], [place] [address] [place], [place] [address]    Report Status 06/11/2022 FINAL  Final  Culture, blood (Routine X 2) w Reflex to ID Panel     Status: None   Collection Time: 06/06/22 10:51 AM   Specimen: BLOOD  Result Value Ref Range Status   Specimen Description BLOOD LEFT ANTECUBITAL  Final   Special Requests   Final    BOTTLES DRAWN AEROBIC AND ANAEROBIC Blood Culture adequate volume   Culture   Final    NO GROWTH 5 DAYS Performed at [facility], [place] [address], [place], [place] [address]    Report Status 06/11/2022 FINAL  Final         Radiology Studies: No results found.      Scheduled Meds:  amLODipine  10 mg Oral Daily   vitamin C  500 mg Oral Daily   aspirin EC  81 mg Oral Daily   atorvastatin  40 mg Oral Daily   chlorthalidone  25 mg Oral Daily   cholecalciferol  2,000 Units Oral Daily   cloNIDine  0.1 mg Oral BID   clopidogrel  75 mg Oral Daily   enoxaparin (LOVENOX) injection  30 mg Subcutaneous Q24H   famotidine  10 mg Oral Daily   hydrALAZINE  20 mg Intravenous QID   multivitamin with minerals  1 tablet Oral Daily   pantoprazole  40 mg Oral BID   predniSONE  50 mg Oral Daily   zinc sulfate  220 mg Oral Daily   Continuous Infusions:  methocarbamol (ROBAXIN) IV Stopped (06/12/22 0725)   promethazine (PHENERGAN) injection (IM or IVPB) 12.5 mg (06/07/22 0300)     LOS: 18 days    Time spent: 35 minutes    Dieter Hane Darleen Crocker, DO [facility]  If 7PM-7AM, please contact night-coverage [email or web address] 06/13/2022, 10:10 AM

## 2022-06-13 NOTE — Progress Notes (Signed)
Inpatient Rehab Admissions Coordinator:    Case resubmitted for CIR to Pt.'s insurance. I notified Pt.'s husband.   Clemens Catholic, [place], [place] Admissions Coordinator  [phone] ([place]) [phone] (office)

## 2022-06-13 NOTE — Progress Notes (Signed)
Inpatient Rehab Admissions Coordinator:   I spoke with Pt.'s husband regarding CIR appeal. He states that appeal that he filed was denied and that he wants me to submit an entirely new case once Pt. Is medically stable. I let him know that Pt.'s insurance is very unlikely to respond differently to another case, but that I will consider it. Encouraged him to consider taking pt. Home with [facility] PT/OT once she's medically stable or to prepare for that as a backup if plan to file another appeal for CIR is not successful.  Clemens Catholic, [place], [place] Admissions Coordinator  [phone] ([place]) [phone] (office)

## 2022-06-14 LAB — CBC
HCT: 29.5 % — ABNORMAL LOW (ref 36.0–46.0)
Hemoglobin: 9.9 g/dL — ABNORMAL LOW (ref 12.0–15.0)
MCH: 32 pg (ref 26.0–34.0)
MCHC: 33.6 g/dL (ref 30.0–36.0)
MCV: 95.5 fL (ref 80.0–100.0)
Platelets: 368 10*3/uL (ref 150–400)
RBC: 3.09 MIL/uL — ABNORMAL LOW (ref 3.87–5.11)
RDW: 13.9 % (ref 11.5–15.5)
WBC: 13.1 10*3/uL — ABNORMAL HIGH (ref 4.0–10.5)
nRBC: 0 % (ref 0.0–0.2)

## 2022-06-14 LAB — SEDIMENTATION RATE: Sed Rate: 94 mm/hr — ABNORMAL HIGH (ref 0–22)

## 2022-06-14 LAB — C-REACTIVE PROTEIN: CRP: 5.4 mg/dL — ABNORMAL HIGH (ref <1.0)

## 2022-06-14 MED ORDER — OXYCODONE HCL 5 MG PO TABS
5.0000 mg | ORAL_TABLET | ORAL | 0 refills | Status: DC | PRN
Start: 2022-06-14 — End: 2022-07-04

## 2022-06-14 MED ORDER — ALPRAZOLAM 1 MG PO TABS: 0.5000 mg | ORAL_TABLET | Freq: Every day | ORAL | 0 refills | Status: DC | PRN

## 2022-06-14 NOTE — Progress Notes (Signed)
IP rehab admissions - I received a call from insurance case manager offering a peer to peer with Market [employer].  Dr. Naaman Plummer will do the peer to peer review and let us know the outcome.  Will have my partner follow up tomorrow.  Call for questions [phone]

## 2022-06-14 NOTE — Progress Notes (Signed)
Initial Nutrition Assessment  DOCUMENTATION CODES:   Not applicable  INTERVENTION:   Liberalize diet to REGULAR  Magic cup BID with meals, each supplement provides 290 kcal and 9 grams of protein  D/C Zinc Sulfate- pt has received 14 doses for supplement  Continue MVI with Minerals, Vit C -Once po intake improves, change standard MVI with Renal MVI   NUTRITION DIAGNOSIS:   Inadequate oral intake related to acute illness as evidenced by per patient/family report.  Being addressed via supplement, diet liberalization  GOAL:   Patient will meet greater than or equal to 90% of their needs  Not met but progressing  MONITOR:   PO intake, Supplement acceptance, Weight trends, Skin  REASON FOR ASSESSMENT:   Malnutrition Screening Tool    ASSESSMENT:   Pt admitted 7/3 d/t L PCA stroke, was discharged to CIR on 7/11 and then readmitted 7/18 to acute care for fever secondary to [place]. Now with nausea, vomiting and mild pancreatitis. Plans to readmit to CIR once stable and bed available. PMH significant for fibromyalgia, chronic low back pain s/p L4-L5 posterior lumbar fusion (6/15), CAD, HLD, CKD stage IIIb, penetrating atherosclerotic ulcer of descending aorta (7/23).  PO intake improved from initial assessment but still remains poor/fair. Recorded po intake has been variable, 0-75% of meals. Pt ate croissant sandwich with egg, cheese and meat for breakfast this AM that was brought in by family.   Pt does not like the hospital food, asking for salt and more food options. Given inadequacy of po intake, recommend liberalizing diet for now until po intake improves.   Pt does not like Ensure/Boost type supplements, pt reports these make her vomit. Pt does like ice cream/milkshakes, discussed trial of Magic Cup with pt and pt's family. All agree this might be a good option to try  Weight stable over the past week  Labs: reviewed Meds: prednisone, zinc sulfate, MVI with minerals,  cholecalciferol, Vit C    Diet Order:   Diet Order             Diet Heart Room service appropriate? Yes; Fluid consistency: Thin  Diet effective now                   EDUCATION NEEDS:   Education needs have been addressed  Skin:  Skin Assessment: Skin Integrity Issues: Skin Integrity Issues:: Stage II Stage II: L buttock  Last BM:  8/2  Height:   Ht Readings from Last 1 Encounters:  05/26/22 $RemoveB'5\' 3"'[id number]$  (1.6 m)    Weight:   Wt Readings from Last 1 Encounters:  06/11/22 63.8 kg     BMI:  Body mass index is 24.92 kg/m.  Estimated Nutritional Needs:   Kcal:  1700-1900  Protein:  90-105g  Fluid:  >/=1.7L  Kerman Passey MS, RDN, LDN, CNSC Registered Dietitian 3 Clinical Nutrition RD Pager and On-Call Pager Number Located in [place]

## 2022-06-14 NOTE — Plan of Care (Signed)
  Problem: Coping: Goal: Psychosocial and spiritual needs will be supported Outcome: Progressing   Problem: Respiratory: Goal: Will maintain a patent airway Outcome: Progressing Goal: Complications related to the disease process, condition or treatment will be avoided or minimized Outcome: Progressing   

## 2022-06-14 NOTE — Progress Notes (Signed)
PROGRESS NOTE    Robin Arellano  XLK:[id number] DOB: 12-Apr-1951 DOA: 05/26/2022 PCP: Sandi Mariscal, MD   Brief Narrative:  71 year old woman recent diagnosis left PCA stroke with discharged to inpatient rehab 7/11, readmitted 7/18 to acute care for fever secondary to [place].  Plan for readmission to CIR when bed available.    Assessment & Plan:   Principal Problem:   COVID-19 virus infection Active Problems:   Essential hypertension   Acute left PCA stroke (HCC)   Coronary artery disease involving native coronary artery of native heart without angina pectoris   Chronic kidney disease, stage 3b (HCC)   Mixed hyperlipidemia   Low back pain   Pressure injury of skin   Nausea & vomiting  Assessment and Plan:  COVID-19 positive 05/26/22, with associated n/v, now coughing and malaise --Paxlovid stopped 7/18 due to rise in Cr  --Remains stable on room air --Supportive treatments as ordered, encourage IS, encourage prone positioning, encourage mobilization  --CTA chest negative for PE -Recent CXR, clear -UA neg, blood cx neg -Recent increased malaise, cough, and markedly elevated CRP peaking to 34 -Now on steroids   Nausea, vomiting. Acute pancreatitis considered based on elevated lipase and symptoms, but now ruled out with CT. --CT abd/pelvis no acute features -- Presumably secondary to COVID.   -Cont anti-emetic as needed -Now resolved   Essential Hypertension --Control improved.  Continue Norvasc, hydralazine, Catapres, chlorthalidone. ' Acute on chronic pain secondary to spondylolisthesis L4-L5 with lumbar radiculopathy --cont oxycodone as needed   Acute metabolic encephalopathy --multifactorial, consistent with delirium by description, likely secondary to recent stroke, COVID, UTI -- Resolved   UTI --completed treatment -repeat UA neg   Left PCA stroke, CAD --Plavix, aspirin, Lipitor --PT, OT following -CIR has been denied by pt's insurance. Family in process  of appeal   AKI on CKD stage IIIb --Baseline creatinine 1.9 --Cr improved to 1.47, hold further IVF   Diarrhea --Likely in setting of COVID. -Much improved     Toxic metabolic encephalopathy -Noted to be more confused recently -Suspect secondary to high dose steroids -Now weaned to PO prednisone with improvement in mentation -Have d/c ambien   Anemia -Recent hgb trended down to 6.9 -No obvious source of bleed identified thus far -improved with 1 unit PRBC with hgb stable thus far   DVT prophylaxis:Lovenox Code Status: Full Family Communication: None at bedside Disposition Plan:  Status is: Inpatient Remains inpatient appropriate because: IV medications     Nutritional Assessment:   The patient's BMI is: Body mass index is 24.92 kg/m.Marland Kitchen   Seen by dietician.  I agree with the assessment and plan as outlined below:   Nutrition Status: Nutrition Problem: Inadequate oral intake Etiology: acute illness Signs/Symptoms: per patient/family report Interventions: Refer to RD note for recommendations   .       Skin Assessment:   I have examined the patient's skin and I agree with the wound assessment as performed by the wound care RN as outlined below:       Pressure Injury 05/26/22 Buttocks Left Stage 2 -  Partial thickness loss of dermis presenting as a shallow open injury with a red, pink wound bed without slough. red, pink (Active)  05/26/22 2045  Location: Buttocks  Location Orientation: Left  Staging: Stage 2 -  Partial thickness loss of dermis presenting as a shallow open injury with a red, pink wound bed without slough.  Wound Description (Comments): red, pink  Present on Admission: Yes  Dressing Type  None 06/11/22 2200      Consultants:  None   Procedures:  None  Antimicrobials:  Anti-infectives (From admission, onward)    Start     Dose/Rate Route Frequency Ordered Stop   06/07/22 1930  molnupiravir EUA (LAGEVRIO) capsule 800 mg        4 capsule  Oral 2 times daily 06/07/22 1827 06/12/22 0913   05/28/22 1300  cefTRIAXone (ROCEPHIN) 1 g in sodium chloride 0.9 % 100 mL IVPB  Status:  Discontinued        1 g 200 mL/hr over 30 Minutes Intravenous Every 24 hours 05/28/22 1209 05/30/22 2118   05/27/22 0115  nirmatrelvir/ritonavir EUA (renal dosing) (PAXLOVID) 2 tablet  Status:  Discontinued        2 tablet Oral 2 times daily 05/27/22 0017 05/29/22 1305      Subjective: Patient seen and evaluated today with all over body aches. No fever, chills, dyspnea, or cough noted.  Objective: Vitals:   06/13/22 2059 06/14/22 1037 06/14/22 1041 06/14/22 1346  BP: (!) 160/70 (!) 158/68 (!) 158/68 (!) 140/60  Pulse: 61  (!) 51   Resp: 18  16   Temp: 98.2 F (36.8 C)  98.4 F (36.9 C)   TempSrc: Oral  Oral   SpO2: 98%  96%   Weight:      Height:        Intake/Output Summary (Last 24 hours) at 06/14/2022 1437 Last data filed at 06/14/2022 1000 Gross per 24 hour  Intake 1020 ml  Output 300 ml  Net 720 ml   Filed Weights   06/06/22 0516 06/07/22 0559 06/11/22 0500  Weight: 63.7 kg 63.2 kg 63.8 kg    Examination:  General exam: Appears calm and comfortable  Respiratory system: Clear to auscultation. Respiratory effort normal. Cardiovascular system: S1 & S2 heard, RRR.  Gastrointestinal system: Abdomen is soft Central nervous system: Alert and awake Extremities: No edema Skin: No significant lesions noted Psychiatry: Flat affect.    Data Reviewed: I have personally reviewed following labs and imaging studies  CBC: Recent Labs  Lab 06/09/22 0228 06/10/22 0441 06/10/22 1507 06/11/22 0249 06/12/22 0146 06/14/22 1157  WBC 10.7* 8.9  --  10.5 8.6 13.1*  HGB 7.0* 6.9* 7.9* 8.8* 8.2* 9.9*  HCT 21.0* 20.3* 22.9* 26.6* 24.5* 29.5*  MCV 95.9 95.8  --  96.0 95.7 95.5  PLT 394 378  --  390 358 322   Basic Metabolic Panel: Recent Labs  Lab 06/08/22 0455 06/09/22 0228 06/10/22 0441 06/11/22 0249 06/12/22 0146  NA 131* 135 136  137 136  K 3.2* 3.9 3.8 3.7 3.9  CL 97* 98 103 103 101  CO2 '25 25 24 26 26  '$ GLUCOSE 160* 138* 139* 101* 114*  BUN '11 18 20 '$ 26* 26*  CREATININE 1.34* 1.39* 1.41* 1.64* 1.47*  CALCIUM 8.4* 8.4* 8.4* 8.4* 8.2*   GFR: Estimated Creatinine Clearance: 31.6 mL/min (A) (by C-G formula based on SCr of 1.47 mg/dL (H)). Liver Function Tests: Recent Labs  Lab 06/08/22 0455 06/09/22 0228 06/10/22 0441 06/11/22 0249 06/12/22 0146  AST '16 25 26 27 '$ 33  ALT '13 14 18 20 24  '$ ALKPHOS 124 110 157* 153* 166*  BILITOT 0.5 0.2* 0.5 0.3 0.6  PROT 6.3* 5.6* 5.7* 5.5* 5.1*  ALBUMIN 2.1* 2.0* 2.1* 2.0* 2.0*   No results for input(s): "LIPASE", "AMYLASE" in the last 168 hours. No results for input(s): "AMMONIA" in the last 168 hours. Coagulation Profile: No results for input(s): "  INR", "PROTIME" in the last 168 hours. Cardiac Enzymes: No results for input(s): "CKTOTAL", "CKMB", "CKMBINDEX", "TROPONINI" in the last 168 hours. BNP (last 3 results) No results for input(s): "PROBNP" in the last 8760 hours. HbA1C: No results for input(s): "HGBA1C" in the last 72 hours. CBG: No results for input(s): "GLUCAP" in the last 168 hours. Lipid Profile: No results for input(s): "CHOL", "HDL", "LDLCALC", "TRIG", "CHOLHDL", "LDLDIRECT" in the last 72 hours. Thyroid Function Tests: No results for input(s): "TSH", "T4TOTAL", "FREET4", "T3FREE", "THYROIDAB" in the last 72 hours. Anemia Panel: No results for input(s): "VITAMINB12", "FOLATE", "FERRITIN", "TIBC", "IRON", "RETICCTPCT" in the last 72 hours. Sepsis Labs: Recent Labs  Lab 06/08/22 0455 06/09/22 0228 06/10/22 0441  PROCALCITON 0.30 0.16 <0.10    Recent Results (from the past 240 hour(s))  Culture, blood (Routine X 2) w Reflex to ID Panel     Status: None   Collection Time: 06/06/22 10:50 AM   Specimen: BLOOD  Result Value Ref Range Status   Specimen Description BLOOD RIGHT ANTECUBITAL  Final   Special Requests   Final    BOTTLES DRAWN AEROBIC  AND ANAEROBIC Blood Culture adequate volume   Culture   Final    NO GROWTH 5 DAYS Performed at [facility], [place] [address] [place], [place] [address]    Report Status 06/11/2022 FINAL  Final  Culture, blood (Routine X 2) w Reflex to ID Panel     Status: None   Collection Time: 06/06/22 10:51 AM   Specimen: BLOOD  Result Value Ref Range Status   Specimen Description BLOOD LEFT ANTECUBITAL  Final   Special Requests   Final    BOTTLES DRAWN AEROBIC AND ANAEROBIC Blood Culture adequate volume   Culture   Final    NO GROWTH 5 DAYS Performed at [facility], [place] [address], [place], [place] [address]    Report Status 06/11/2022 FINAL  Final         Radiology Studies: VAS [place] LOWER EXTREMITY VENOUS (DVT)  Result Date: 06/09/2022  Lower Venous DVT Study Patient Name:  HAILE TOPPINS  Date of Exam:   06/08/2022 Medical Rec #: [id number]             Accession #:    [id number] Date of Birth: [birth year]             Patient Gender: F Patient Age:   53 years Exam Location:  [facility] Procedure:      VAS [place] LOWER EXTREMITY VENOUS (DVT) Referring Phys: STEPHEN CHIU --------------------------------------------------------------------------------  Indications: Elevated d-dimer.  Comparison Study: No previous exam noted. Performing Technologist: Bobetta Lime BS, RVT  Examination Guidelines: A complete evaluation includes B-mode imaging, spectral Doppler, color Doppler, and power Doppler as needed of all accessible portions of each vessel. Bilateral testing is considered an integral part of a complete examination. Limited examinations for reoccurring indications may be performed as noted. The reflux portion of the exam is performed with the patient in reverse Trendelenburg.  +---------+---------------+---------+-----------+----------+--------------+ RIGHT    CompressibilityPhasicitySpontaneityPropertiesThrombus Aging  +---------+---------------+---------+-----------+----------+--------------+ CFV      Full           Yes      Yes                                 +---------+---------------+---------+-----------+----------+--------------+ SFJ      Full                                                        +---------+---------------+---------+-----------+----------+--------------+  FV Prox  Full                                                        +---------+---------------+---------+-----------+----------+--------------+ FV Mid   Full                                                        +---------+---------------+---------+-----------+----------+--------------+ FV DistalFull                                                        +---------+---------------+---------+-----------+----------+--------------+ PFV      Full                                                        +---------+---------------+---------+-----------+----------+--------------+ POP      Full           Yes      Yes                                 +---------+---------------+---------+-----------+----------+--------------+ PTV      Full                                                        +---------+---------------+---------+-----------+----------+--------------+ PERO     Full                                                        +---------+---------------+---------+-----------+----------+--------------+   +---------+---------------+---------+-----------+----------+--------------+ LEFT     CompressibilityPhasicitySpontaneityPropertiesThrombus Aging +---------+---------------+---------+-----------+----------+--------------+ CFV      Full           Yes      Yes                                 +---------+---------------+---------+-----------+----------+--------------+ SFJ      Full                                                         +---------+---------------+---------+-----------+----------+--------------+ FV Prox  Full                                                        +---------+---------------+---------+-----------+----------+--------------+  FV Mid   Full                                                        +---------+---------------+---------+-----------+----------+--------------+ FV DistalFull                                                        +---------+---------------+---------+-----------+----------+--------------+ PFV      Full                                                        +---------+---------------+---------+-----------+----------+--------------+ POP      Full           Yes      Yes                                 +---------+---------------+---------+-----------+----------+--------------+ PTV      Full                                                        +---------+---------------+---------+-----------+----------+--------------+ PERO     Full                                                        +---------+---------------+---------+-----------+----------+--------------+     Summary: BILATERAL: - No evidence of deep vein thrombosis seen in the lower extremities, bilaterally. -No evidence of popliteal cyst, bilaterally.   *See table(s) above for measurements and observations. Electronically signed by Jamelle Haring on 06/09/2022 at 11:47:33 AM.    Final         Scheduled Meds:  amLODipine  10 mg Oral Daily   vitamin C  500 mg Oral Daily   aspirin EC  81 mg Oral Daily   atorvastatin  40 mg Oral Daily   chlorthalidone  25 mg Oral Daily   cholecalciferol  2,000 Units Oral Daily   cloNIDine  0.1 mg Oral BID   clopidogrel  75 mg Oral Daily   enoxaparin (LOVENOX) injection  30 mg Subcutaneous Q24H   famotidine  10 mg Oral Daily   hydrALAZINE  20 mg Intravenous QID   multivitamin with minerals  1 tablet Oral Daily   pantoprazole  40 mg Oral BID    predniSONE  50 mg Oral Daily   zinc sulfate  220 mg Oral Daily   Continuous Infusions:  methocarbamol (ROBAXIN) IV Stopped (06/12/22 0725)   promethazine (PHENERGAN) injection (IM or IVPB) 12.5 mg (06/07/22 0300)     LOS: 19 days    Time spent: 35 minutes    Yarianna Varble Darleen Crocker, DO [facility]  If 7PM-7AM, please contact  night-coverage [email or web address] 06/14/2022, 2:37 PM

## 2022-06-15 MED ORDER — CLOPIDOGREL BISULFATE 75 MG PO TABS: 75.0000 mg | ORAL_TABLET | Freq: Every day | ORAL | 0 refills | Status: AC

## 2022-06-15 MED ORDER — GUAIFENESIN-DM 100-10 MG/5ML PO SYRP: 10.0000 mL | ORAL_SOLUTION | ORAL | 0 refills | Status: DC | PRN

## 2022-06-15 MED ORDER — BUTALBITAL-APAP-CAFFEINE 50-325-40 MG PO TABS: 1.0000 | ORAL_TABLET | Freq: Four times a day (QID) | ORAL | 0 refills | Status: DC | PRN

## 2022-06-15 MED ORDER — ALBUTEROL SULFATE HFA 108 (90 BASE) MCG/ACT IN AERS: 2.0000 | INHALATION_SPRAY | RESPIRATORY_TRACT | 0 refills | Status: DC | PRN

## 2022-06-15 MED ORDER — ADULT MULTIVITAMIN W/MINERALS CH: 1.0000 | ORAL_TABLET | Freq: Every day | ORAL | 0 refills | Status: AC

## 2022-06-15 MED ORDER — LOPERAMIDE HCL 2 MG PO CAPS: 2.0000 mg | ORAL_CAPSULE | ORAL | 0 refills | Status: DC | PRN

## 2022-06-15 MED ORDER — CHLORTHALIDONE 25 MG PO TABS: 25.0000 mg | ORAL_TABLET | Freq: Every day | ORAL | 0 refills | Status: DC

## 2022-06-15 MED ORDER — PREDNISONE 50 MG PO TABS: 50.0000 mg | ORAL_TABLET | Freq: Every day | ORAL | 0 refills | Status: AC

## 2022-06-15 MED ORDER — ONDANSETRON HCL 4 MG/2ML IJ SOLN: 4.0000 mg | Freq: Three times a day (TID) | INTRAMUSCULAR | 0 refills | Status: DC | PRN

## 2022-06-15 MED ORDER — HYDRALAZINE HCL 10 MG PO TABS
20.0000 mg | ORAL_TABLET | Freq: Four times a day (QID) | ORAL | Status: DC
  Administered 2022-06-15 (×2): 20 mg via ORAL
  Filled 2022-06-15 (×2): qty 2

## 2022-06-15 MED ORDER — FLUCONAZOLE 100 MG PO TABS: 100.0000 mg | ORAL_TABLET | Freq: Every day | ORAL | 0 refills | Status: AC

## 2022-06-15 MED ORDER — PANTOPRAZOLE SODIUM 40 MG PO TBEC: 40.0000 mg | DELAYED_RELEASE_TABLET | Freq: Every day | ORAL | 0 refills | Status: DC

## 2022-06-15 NOTE — Progress Notes (Signed)
DISCHARGE NOTE HOME Robin Arellano to be discharged Home per MD order. Discussed prescriptions and follow up appointments with the patient. Prescriptions given to patient; medication list explained in detail. Patient verbalized understanding.  Skin clean, dry and intact without evidence of skin break down, no evidence of skin tears noted. IV catheter discontinued intact. Site without signs and symptoms of complications. Dressing and pressure applied. Pt denies pain at the site currently. No complaints noted.  Patient free of lines, drains, and wounds.   An After Visit Summary (AVS) was printed and given to the patient. Patient escorted via stretcher, and discharged home via [facility].  Sharmon Revere, RN

## 2022-06-15 NOTE — Discharge Summary (Addendum)
Physician Discharge Summary  Carri Jennel Mara FVC:[id number] DOB: February 10, 1951 DOA: 05/26/2022  PCP: Sandi Mariscal, MD  Admit date: 05/26/2022  Discharge date: 06/15/2022  Admitted From:Home  Disposition:  Home  Recommendations for Outpatient Follow-up:  Follow up with PCP in 1-2 weeks Continue on prednisone as prescribed for few more days Continue other medications as noted below Diflucan for oral thrush as prescribed  Home Health: Yes with PT, OT, SLP  Equipment/Devices: None  Discharge Condition:Stable  CODE STATUS: Full  Diet recommendation: Heart Healthy  Brief/Interim Summary: 71 year old woman with history of hypertension, lumbar radiculopathy, CAD, CKD stage IIIb, and anemia with recent diagnosis left PCA stroke with discharged to inpatient rehab 7/11, readmitted 7/18 to acute care for fever secondary to [place].  She was initially treated with Paxlovid and this was stopped due to rising creatinine levels and she was managed with supportive care.  CTA chest negative for PE.  She was noted to have nausea and vomiting as well as ongoing pain as well as acute metabolic encephalopathy in the setting of COVID as well as noted UTI.  She has completed treatment for UTI and is otherwise in stable condition for discharge.  She was also noted to have some mild AKI on CKD stage IIIb which has also resolved.  Additionally, she had some diarrhea due to COVID as well as some toxic metabolic encephalopathy due to all of the above.  This has prolonged her course of stay and then she was wanting to go back to CIR, however after multiple appeals that she was denied and will now be set up with home health PT, OT, and SLP as she declines SNF.  No other acute events noted throughout the course of the stay and she is stable for discharge.  Discharge Diagnoses:  Principal Problem:   COVID-19 virus infection Active Problems:   Essential hypertension   Acute left PCA stroke (HCC)   Coronary artery disease  involving native coronary artery of native heart without angina pectoris   Chronic kidney disease, stage 3b (HCC)   Mixed hyperlipidemia   Low back pain   Pressure injury of skin   Nausea & vomiting  COVID-19 positive 05/26/22, with associated n/v, now coughing and malaise --Paxlovid stopped 7/18 due to rise in Cr  --Remains stable on room air --Supportive treatments as ordered, encourage IS, encourage prone positioning, encourage mobilization  --CTA chest negative for PE -Recent CXR, clear -UA neg, blood cx neg -Recent increased malaise, cough, and markedly elevated CRP peaking to 34 -Now on steroids for 3 more days as prescribed   Nausea, vomiting. Acute pancreatitis considered based on elevated lipase and symptoms, but now ruled out with CT. --CT abd/pelvis no acute features -- Presumably secondary to COVID.   -Cont anti-emetic as needed -Now resolved   Essential Hypertension --Control improved.  Continue Norvasc, hydralazine, Catapres, chlorthalidone. ' Acute on chronic pain secondary to spondylolisthesis L4-L5 with lumbar radiculopathy --cont oxycodone as needed   Acute metabolic encephalopathy --multifactorial, consistent with delirium by description, likely secondary to recent stroke, COVID, UTI -- Resolved   UTI --completed treatment -repeat UA neg   Left PCA stroke, CAD --Plavix, aspirin, Lipitor --PT, OT following -CIR has been denied by pt's insurance -Patient refusing SNF and will be discharged to home health with PT/OT   AKI on CKD stage IIIb --Baseline creatinine 1.9 --Cr improved to 1.47, no need for further IV fluid   Diarrhea-resolved --Likely in setting of COVID. -Resolved     Toxic metabolic  encephalopathy-resolved -Suspect this was related to medical illness as well as steroids -This has now resolved and patient is back to baseline   Anemia-stabilized -Recent hgb trended down to 6.9 -No obvious source of bleed identified thus far -improved  with 1 unit PRBC with hgb stable thus far  Discharge Instructions  Discharge Instructions     Diet - low sodium heart healthy   Complete by: As directed    Increase activity slowly   Complete by: As directed    No wound care   Complete by: As directed       Allergies as of 06/15/2022       Reactions   Flexeril [cyclobenzaprine] Anaphylaxis, Hives, Itching, Swelling   Lidocaine Hives   Zanaflex [tizanidine] Anaphylaxis, Hives, Itching, Swelling   Neurontin [gabapentin] Itching, Swelling   Latex Rash        Medication List     STOP taking these medications    oxyCODONE-acetaminophen 5-325 MG tablet Commonly known as: PERCOCET/ROXICET       TAKE these medications    albuterol 108 (90 Base) MCG/ACT inhaler Commonly known as: VENTOLIN HFA Inhale 2 puffs into the lungs every 4 (four) hours as needed for wheezing or shortness of breath.   ALPRAZolam 1 MG tablet Commonly known as: XANAX Take 0.5 tablets (0.5 mg total) by mouth daily as needed for sleep or anxiety.   amLODipine 10 MG tablet Commonly known as: NORVASC TAKE 1 TABLET(10 MG) BY MOUTH DAILY What changed: See the new instructions.   aspirin EC 81 MG tablet Take 81 mg by mouth as needed (chest pain). Swallow whole.   atorvastatin 40 MG tablet Commonly known as: LIPITOR Take 1 tablet (40 mg total) by mouth daily.   chlorthalidone 25 MG tablet Commonly known as: HYGROTON Take 1 tablet (25 mg total) by mouth daily. Start taking on: June 16, 2022   cloNIDine 0.1 MG tablet Commonly known as: CATAPRES Take 1 tablet (0.1 mg total) by mouth at bedtime.   clopidogrel 75 MG tablet Commonly known as: PLAVIX Take 1 tablet (75 mg total) by mouth daily for 16 days.   ezetimibe 10 MG tablet Commonly known as: ZETIA TAKE 1 TABLET(10 MG) BY MOUTH DAILY What changed: See the new instructions.   guaiFENesin-dextromethorphan 100-10 MG/5ML syrup Commonly known as: ROBITUSSIN DM Take 10 mLs by mouth every 4  (four) hours as needed for cough.   hydrALAZINE 100 MG tablet Commonly known as: APRESOLINE Take 1 tablet (100 mg total) by mouth 3 (three) times daily. What changed: when to take this   loperamide 2 MG capsule Commonly known as: IMODIUM Take 1 capsule (2 mg total) by mouth as needed for diarrhea or loose stools.   methocarbamol 500 MG tablet Commonly known as: ROBAXIN Take 1 tablet (500 mg total) by mouth every 6 (six) hours as needed for muscle spasms.   multivitamin with minerals Tabs tablet Take 1 tablet by mouth daily. Start taking on: June 16, 2022   nystatin 100000 UNIT/ML suspension Commonly known as: MYCOSTATIN Take 5 mLs (500,000 Units total) by mouth 4 (four) times daily.   ondansetron 4 MG/2ML Soln injection Commonly known as: ZOFRAN Inject 2 mLs (4 mg total) into the vein every 8 (eight) hours as needed for nausea or vomiting.   oxyCODONE 5 MG immediate release tablet Commonly known as: Oxy IR/ROXICODONE Take 1-2 tablets (5-10 mg total) by mouth every 4 (four) hours as needed for moderate pain or severe pain.   pantoprazole 40  MG tablet Commonly known as: PROTONIX Take 1 tablet (40 mg total) by mouth daily.   polyethylene glycol powder 17 GM/SCOOP powder Commonly known as: GLYCOLAX/MIRALAX Take 17 g by mouth daily as needed.   predniSONE 50 MG tablet Commonly known as: DELTASONE Take 1 tablet (50 mg total) by mouth daily with breakfast for 3 days.   topiramate 25 MG tablet Commonly known as: TOPAMAX Take 1 tablet (25 mg total) by mouth 2 (two) times daily.   TUMS PO Take 2 tablets by mouth daily as needed (stomach pain).   valACYclovir 500 MG tablet Commonly known as: VALTREX TAKE 1 TABLET BY MOUTH TWICE DAILY FOR 3 TO 5 DAYS THEN TAKE DAILY AS NEEDED What changed:  how much to take how to take this when to take this reasons to take this additional instructions   vitamin C 1000 MG tablet Take 1,000 mg by mouth in the morning.   Vitamin D 50  MCG (2000 UT) tablet Take 2,000 Units by mouth in the morning.        Follow-up Information     Sandi Mariscal, MD. Schedule an appointment as soon as possible for a visit in 1 week(s).   Specialty: Internal Medicine Contact information: [place] [address] [phone]                Allergies  Allergen Reactions   Flexeril [Cyclobenzaprine] Anaphylaxis, Hives, Itching and Swelling   Lidocaine Hives   Zanaflex [Tizanidine] Anaphylaxis, Hives, Itching and Swelling   Neurontin [Gabapentin] Itching and Swelling   Latex Rash    Consultations: None   Procedures/Studies: VAS [place] LOWER EXTREMITY VENOUS (DVT)  Result Date: 06/09/2022  Lower Venous DVT Study Patient Name:  TEREKA THORLEY  Date of Exam:   06/08/2022 Medical Rec #: [id number]             Accession #:    [id number] Date of Birth: [birth year]             Patient Gender: F Patient Age:   71 years Exam Location:  [facility] Procedure:      VAS [place] LOWER EXTREMITY VENOUS (DVT) Referring Phys: STEPHEN CHIU --------------------------------------------------------------------------------  Indications: Elevated d-dimer.  Comparison Study: No previous exam noted. Performing Technologist: Bobetta Lime BS, RVT  Examination Guidelines: A complete evaluation includes B-mode imaging, spectral Doppler, color Doppler, and power Doppler as needed of all accessible portions of each vessel. Bilateral testing is considered an integral part of a complete examination. Limited examinations for reoccurring indications may be performed as noted. The reflux portion of the exam is performed with the patient in reverse Trendelenburg.  +---------+---------------+---------+-----------+----------+--------------+ RIGHT    CompressibilityPhasicitySpontaneityPropertiesThrombus Aging +---------+---------------+---------+-----------+----------+--------------+ CFV      Full           Yes      Yes                                  +---------+---------------+---------+-----------+----------+--------------+ SFJ      Full                                                        +---------+---------------+---------+-----------+----------+--------------+ FV Prox  Full                                                        +---------+---------------+---------+-----------+----------+--------------+  FV Mid   Full                                                        +---------+---------------+---------+-----------+----------+--------------+ FV DistalFull                                                        +---------+---------------+---------+-----------+----------+--------------+ PFV      Full                                                        +---------+---------------+---------+-----------+----------+--------------+ POP      Full           Yes      Yes                                 +---------+---------------+---------+-----------+----------+--------------+ PTV      Full                                                        +---------+---------------+---------+-----------+----------+--------------+ PERO     Full                                                        +---------+---------------+---------+-----------+----------+--------------+   +---------+---------------+---------+-----------+----------+--------------+ LEFT     CompressibilityPhasicitySpontaneityPropertiesThrombus Aging +---------+---------------+---------+-----------+----------+--------------+ CFV      Full           Yes      Yes                                 +---------+---------------+---------+-----------+----------+--------------+ SFJ      Full                                                        +---------+---------------+---------+-----------+----------+--------------+ FV Prox  Full                                                         +---------+---------------+---------+-----------+----------+--------------+ FV Mid   Full                                                        +---------+---------------+---------+-----------+----------+--------------+  FV DistalFull                                                        +---------+---------------+---------+-----------+----------+--------------+ PFV      Full                                                        +---------+---------------+---------+-----------+----------+--------------+ POP      Full           Yes      Yes                                 +---------+---------------+---------+-----------+----------+--------------+ PTV      Full                                                        +---------+---------------+---------+-----------+----------+--------------+ PERO     Full                                                        +---------+---------------+---------+-----------+----------+--------------+     Summary: BILATERAL: - No evidence of deep vein thrombosis seen in the lower extremities, bilaterally. -No evidence of popliteal cyst, bilaterally.   *See table(s) above for measurements and observations. Electronically signed by Jamelle Haring on 06/09/2022 at 11:47:33 AM.    Final    DG CHEST PORT 1 VIEW  Result Date: 06/06/2022 CLINICAL DATA:  Cough EXAM: PORTABLE CHEST 1 VIEW COMPARISON:  05/26/2022 FINDINGS: Stable cardiomegaly. Aortic atherosclerosis. No focal airspace consolidation, pleural effusion, or pneumothorax. IMPRESSION: No active disease. Electronically Signed   By: Davina Poke D.O.   On: 06/06/2022 10:38   CT ABDOMEN PELVIS W CONTRAST  Result Date: 06/02/2022 CLINICAL DATA:  Suspected pancreatitis. Fever secondary to COVID. Nausea, vomiting. EXAM: CT ABDOMEN AND PELVIS WITH CONTRAST TECHNIQUE: Multidetector CT imaging of the abdomen and pelvis was performed using the standard protocol following bolus administration  of intravenous contrast. RADIATION DOSE REDUCTION: This exam was performed according to the departmental dose-optimization program which includes automated exposure control, adjustment of the mA and/or kV according to patient size and/or use of iterative reconstruction technique. CONTRAST:  78m OMNIPAQUE IOHEXOL 300 MG/ML  SOLN COMPARISON:  05/14/2022 FINDINGS: Lower chest: The lung bases are clear. Hepatobiliary: No focal liver abnormality is seen. Status post cholecystectomy. No biliary dilatation. Pancreas: Unremarkable. No pancreatic ductal dilatation or surrounding inflammatory changes. Spleen: Normal in size without focal abnormality. Adrenals/Urinary Tract: No adrenal gland nodules. Atrophic left kidney. Nephrograms are homogeneous and symmetrical. No hydronephrosis or hydroureter. Bladder is normal. Stomach/Bowel: Stomach, small bowel, and colon are not abnormally distended. Diverticulosis of the colon without evidence of acute diverticulitis. Appendix is not identified. Vascular/Lymphatic: Calcific and noncalcific plaque formation in the abdominal aorta. Abdominal aortic aneurysm measuring 3  cm diameter. Reproductive: Status post hysterectomy. No adnexal masses. Other: No free air or free fluid in the abdomen. Abdominal wall musculature appears intact. Focal areas of subcutaneous gas in the abdominal wall likely representing injection sites. Musculoskeletal: No acute or significant osseous findings. Postoperative changes in the lumbar spine. IMPRESSION: 1. No acute process suggested in the abdomen or pelvis. No evidence of bowel obstruction or inflammation. No peripancreatic inflammation. 2. Abdominal aortic aneurysm measuring 3 cm diameter. Recommend follow-up ultrasound every 3 years. This recommendation follows ACR consensus guidelines: White Paper of the ACR Incidental Findings Committee II on Vascular Findings. J Am Coll Radiol 2013; 10:789-794. 3. Aortic atherosclerosis. 4. Atrophic left kidney.  Electronically Signed   By: Lucienne Capers M.D.   On: 06/02/2022 19:02   DG Abd Portable 1V  Result Date: 06/01/2022 CLINICAL DATA:  Vomiting weakness dizziness. EXAM: PORTABLE ABDOMEN - 1 VIEW COMPARISON:  Chest CT from May 27, 2022. FINDINGS: EKG leads project over the chest and abdomen. Bowel gas pattern without signs of obstruction. Scattered stool and gas throughout the colon. Small amounts of gas in the area of the rectum. No gross organomegaly or signs of abnormal calcification over the abdomen. Evidence of L4-5 spinal fusion. No acute bony process over regional skeletal structures to the extent evaluated. IMPRESSION: Negative. Electronically Signed   By: Zetta Bills M.D.   On: 06/01/2022 15:51   CT Angio Chest Pulmonary Embolism (PE) W or WO Contrast  Result Date: 05/27/2022 CLINICAL DATA:  Cough. Elevated D-dimer. COVID infection. Clinical suspicion for pulmonary embolism. EXAM: CT ANGIOGRAPHY CHEST WITH CONTRAST TECHNIQUE: Multidetector CT imaging of the chest was performed using the standard protocol during bolus administration of intravenous contrast. Multiplanar CT image reconstructions and MIPs were obtained to evaluate the vascular anatomy. RADIATION DOSE REDUCTION: This exam was performed according to the departmental dose-optimization program which includes automated exposure control, adjustment of the mA and/or kV according to patient size and/or use of iterative reconstruction technique. CONTRAST:  65m OMNIPAQUE IOHEXOL 350 MG/ML SOLN COMPARISON:  05/14/2022 FINDINGS: Cardiovascular: Satisfactory opacification of pulmonary arteries noted, and no pulmonary emboli identified. Stable small pseudoaneurysm or ductus diverticulum arising from the lateral aspect of the aortic arch, measuring 1.8 cm in diameter. Abundant atherosclerotic plaque seen throughout the aorta. Coronary atherosclerotic calcification also noted. Mediastinum/Nodes: No masses or pathologically enlarged lymph nodes  identified. Lungs/Pleura: No pulmonary mass, infiltrate, or effusion. Upper abdomen: No acute findings. Musculoskeletal: No suspicious bone lesions identified. Review of the MIP images confirms the above findings. IMPRESSION: No evidence of pulmonary embolism or other acute findings. 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 Aortic Atherosclerosis (ICD10-I70.0). Electronically Signed   By: JMarlaine HindM.D.   On: 05/27/2022 13:30   DG CHEST PORT 1 VIEW  Result Date: 05/26/2022 CLINICAL DATA:  Cough, CVA EXAM: PORTABLE CHEST 1 VIEW COMPARISON:  05/16/2022 chest radiograph. FINDINGS: Partially visualized surgical hardware from ACDF. Stable cardiomediastinal silhouette with normal heart size. No pneumothorax. No pleural effusion. Lungs appear clear, with no acute consolidative airspace disease and no pulmonary edema. IMPRESSION: No active disease. Electronically Signed   By: JIlona SorrelM.D.   On: 05/26/2022 15:22   [place]  Result Date: 05/18/2022 CLINICAL DATA:  [id number] creatinine EXAM: RENAL / URINARY TRACT ULTRASOUND COMPLETE COMPARISON:  Ultrasound abdomen complete dated May 23, 2017 FINDINGS: Right Kidney: Renal measurements: 9.7 x 4.6 x 4.6 cm = volume: 110 mL. Echogenicity within normal limits.  Mild thinning of the renal cortex. No mass or hydronephrosis visualized. Left Kidney: Renal measurements: 8.1 x 3.4 x 4.1 cm = volume: 59 mL. There is some thinning of the renal cortex seen. No mass or hydronephrosis visualized. Bladder: Appears normal for degree of bladder distention. Other: None. IMPRESSION: Size of the kidneys is within normal limits for the patient's age. There is some thinning of the renal cortices seen greater on the left. No mass or hydronephrosis seen. Electronically Signed   By: Frazier Richards M.D.   On: 05/18/2022 10:13   DG Chest Port 1 View  Result Date: 05/16/2022 CLINICAL DATA:  Dyspnea. EXAM: PORTABLE CHEST  1 VIEW COMPARISON:  September 22, 2018 FINDINGS: The heart size and mediastinal contours are within normal limits. There is marked severity calcification of the thoracic aorta. Both lungs are clear. A radiopaque fusion plate and screws are seen overlying the lower cervical spine. The visualized skeletal structures are unremarkable. IMPRESSION: No active cardiopulmonary disease. Electronically Signed   By: Virgina Norfolk M.D.   On: 05/16/2022 19:28     Discharge Exam: Vitals:   06/15/22 0628 06/15/22 0855  BP: (!) 159/70 (!) 186/58  Pulse: (!) 58 (!) 46  Resp: 18 16  Temp: 98 F (36.7 C) 98 F (36.7 C)  SpO2: 98% 97%   Vitals:   06/14/22 1645 06/14/22 2043 06/15/22 0628 06/15/22 0855  BP: (!) 152/56 (!) 144/65 (!) 159/70 (!) 186/58  Pulse: (!) 59 (!) 57 (!) 58 (!) 46  Resp: '18 18 18 16  '$ Temp: 98 F (36.7 C) 98.8 F (37.1 C) 98 F (36.7 C) 98 F (36.7 C)  TempSrc: Oral  Oral Oral  SpO2: 99% 97% 98% 97%  Weight:      Height:        General: Pt is alert, awake, not in acute distress Cardiovascular: RRR, S1/S2 +, no rubs, no gallops Respiratory: CTA bilaterally, no wheezing, no rhonchi Abdominal: Soft, NT, ND, bowel sounds + Extremities: no edema, no cyanosis    The results of significant diagnostics from this hospitalization (including imaging, microbiology, ancillary and laboratory) are listed below for reference.     Microbiology: Recent Results (from the past 240 hour(s))  Culture, blood (Routine X 2) w Reflex to ID Panel     Status: None   Collection Time: 06/06/22 10:50 AM   Specimen: BLOOD  Result Value Ref Range Status   Specimen Description BLOOD RIGHT ANTECUBITAL  Final   Special Requests   Final    BOTTLES DRAWN AEROBIC AND ANAEROBIC Blood Culture adequate volume   Culture   Final    NO GROWTH 5 DAYS Performed at [facility], [address], [place], [place] [address]    Report Status 06/11/2022 FINAL  Final  Culture, blood (Routine X 2) w Reflex  to ID Panel     Status: None   Collection Time: 06/06/22 10:51 AM   Specimen: BLOOD  Result Value Ref Range Status   Specimen Description BLOOD LEFT ANTECUBITAL  Final   Special Requests   Final    BOTTLES DRAWN AEROBIC AND ANAEROBIC Blood Culture adequate volume   Culture   Final    NO GROWTH 5 DAYS Performed at [facility], [place] [address] [place], [place] [address]    Report Status 06/11/2022 FINAL  Final     Labs: BNP (last 3 results) No results for input(s): "BNP" in the last 8760 hours. Basic Metabolic Panel: Recent Labs  Lab  06/09/22 0228 06/10/22 0441 06/11/22 0249 06/12/22 0146  NA 135 136 137 136  K 3.9 3.8 3.7 3.9  CL 98 103 103 101  CO2 '25 24 26 26  '$ GLUCOSE 138* 139* 101* 114*  BUN 18 20 26* 26*  CREATININE 1.39* 1.41* 1.64* 1.47*  CALCIUM 8.4* 8.4* 8.4* 8.2*   Liver Function Tests: Recent Labs  Lab 06/09/22 0228 06/10/22 0441 06/11/22 0249 06/12/22 0146  AST '25 26 27 '$ 33  ALT '14 18 20 24  '$ ALKPHOS 110 157* 153* 166*  BILITOT 0.2* 0.5 0.3 0.6  PROT 5.6* 5.7* 5.5* 5.1*  ALBUMIN 2.0* 2.1* 2.0* 2.0*   No results for input(s): "LIPASE", "AMYLASE" in the last 168 hours. No results for input(s): "AMMONIA" in the last 168 hours. CBC: Recent Labs  Lab 06/09/22 0228 06/10/22 0441 06/10/22 1507 06/11/22 0249 06/12/22 0146 06/14/22 1157  WBC 10.7* 8.9  --  10.5 8.6 13.1*  HGB 7.0* 6.9* 7.9* 8.8* 8.2* 9.9*  HCT 21.0* 20.3* 22.9* 26.6* 24.5* 29.5*  MCV 95.9 95.8  --  96.0 95.7 95.5  PLT 394 378  --  390 358 368   Cardiac Enzymes: No results for input(s): "CKTOTAL", "CKMB", "CKMBINDEX", "TROPONINI" in the last 168 hours. BNP: Invalid input(s): "POCBNP" CBG: No results for input(s): "GLUCAP" in the last 168 hours. D-Dimer No results for input(s): "DDIMER" in the last 72 hours. Hgb A1c No results for input(s): "HGBA1C" in the last 72 hours. Lipid Profile No results for input(s): "CHOL", "HDL", "LDLCALC", "TRIG", "CHOLHDL", "LDLDIRECT" in the  last 72 hours. Thyroid function studies No results for input(s): "TSH", "T4TOTAL", "T3FREE", "THYROIDAB" in the last 72 hours.  Invalid input(s): "FREET3" Anemia work up No results for input(s): "VITAMINB12", "FOLATE", "FERRITIN", "TIBC", "IRON", "RETICCTPCT" in the last 72 hours. Urinalysis    Component Value Date/Time   COLORURINE YELLOW 06/06/2022 1500   APPEARANCEUR HAZY (A) 06/06/2022 1500   LABSPEC 1.009 06/06/2022 1500   PHURINE 5.0 06/06/2022 1500   GLUCOSEU NEGATIVE 06/06/2022 1500   HGBUR NEGATIVE 06/06/2022 1500   BILIRUBINUR NEGATIVE 06/06/2022 1500   KETONESUR NEGATIVE 06/06/2022 1500   PROTEINUR 30 (A) 06/06/2022 1500   UROBILINOGEN 0.2 05/26/2014 1158   NITRITE NEGATIVE 06/06/2022 1500   LEUKOCYTESUR NEGATIVE 06/06/2022 1500   Sepsis Labs Recent Labs  Lab 06/10/22 0441 06/11/22 0249 06/12/22 0146 06/14/22 1157  WBC 8.9 10.5 8.6 13.1*   Microbiology Recent Results (from the past 240 hour(s))  Culture, blood (Routine X 2) w Reflex to ID Panel     Status: None   Collection Time: 06/06/22 10:50 AM   Specimen: BLOOD  Result Value Ref Range Status   Specimen Description BLOOD RIGHT ANTECUBITAL  Final   Special Requests   Final    BOTTLES DRAWN AEROBIC AND ANAEROBIC Blood Culture adequate volume   Culture   Final    NO GROWTH 5 DAYS Performed at [facility], [address], [place], [place] [address]    Report Status 06/11/2022 FINAL  Final  Culture, blood (Routine X 2) w Reflex to ID Panel     Status: None   Collection Time: 06/06/22 10:51 AM   Specimen: BLOOD  Result Value Ref Range Status   Specimen Description BLOOD LEFT ANTECUBITAL  Final   Special Requests   Final    BOTTLES DRAWN AEROBIC AND ANAEROBIC Blood Culture adequate volume   Culture   Final    NO GROWTH 5 DAYS Performed at [facility], [place]  [address] [place], [place] [address]    Report Status 06/11/2022 FINAL  Final     Time coordinating discharge: 35  minutes  SIGNED:   Rodena Goldmann, DO [facility] 06/15/2022, 11:48 AM  If 7PM-7AM, please contact night-coverage [email or web address]

## 2022-06-15 NOTE — TOC Transition Note (Signed)
Transition of Care [facility]) - CM/SW Discharge Note   Patient Details  Name: Robin Arellano MRN: [id number] Date of Birth: 03/24/1951  [facility]) CM/SW Contact:  Tom-Johnson, Renea Ee, RN Phone Number: 06/15/2022, 3:52 PM   Clinical Narrative:     Patient is scheduled for discharge today. Husband, Robin Arellano initially requested to appeal discharge and wanted Discharge Summary emailed to him in order for him to appeal. CM explained to him that AVS can be emailed to him but he will have to contact Medical Records for Discharge Summary. AVS and a list from Medicare.gov emiled securely to him. CM notified Supervisor of husband's request.  CM spoke with patient and her two sisters at bedside and patient requests to go home with home health and to schedule PTAR.  Home health disciplines referral called in to [facility] per patient's request from a list from Medicare.gov and Amy voiced acceptance. Info on AVS. PTAR scheduled.  No further TOC needs noted.   Final next level of care: [place] Barriers to Discharge: Barriers Resolved   Patient Goals and CMS Choice Patient states their goals for this hospitalization and ongoing recovery are:: To return home CMS Medicare.gov Compare Post Acute Care list provided to:: Patient Choice offered to / list presented to : Patient, Spouse  Discharge Placement                Patient to be transferred to facility by: PTAR      Discharge Plan and Services                DME Arranged: N/A DME Agency: NA       HH Arranged: PT, OT, Speech Therapy HH Agency: [place] Date [facility] Agency Contacted: 06/15/22 Time [place]: 32 Representative spoke with at [place]: Amy  Social Determinants of Health ([place]) Interventions     Readmission Risk Interventions     No data to display

## 2022-06-15 NOTE — Progress Notes (Signed)
Physical Therapy Treatment Patient Details Name: Robin Arellano MRN: [id number] DOB: 04-16-1951 Today's Date: 06/15/2022   History of Present Illness Pt is a 71 y/o female with recent diagnosis of left PCA stroke with eventual discharge inpatient rehab on 7/11 presented back to the medical service due to new onset flulike symptoms, fever of 102.5 F and COVID PCR positivity; with past medical history of fibromyalgia, chronic low back pain status post L4-L5 posterior lumbar fusion 6/15, coronary artery disease (S/P cath 11/2018 mild nonobstructive CAD), hyperlipidemia, chronic kidney disease stage IIIb (baseline Cr 1.6-1.7), 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 Pt very lethargic as well. Sister and pt requesting assistance with repositioning pt higher up in bed. Pt also agreeable to participation in therex as she was interested in things she could do to strengthen her L LE when therapy was not there to work with her. Pt would continue to benefit from skilled physical therapy services at this time while admitted and after d/c to address the below listed limitations in order to improve overall safety and independence with functional mobility.    Recommendations for follow up therapy are one component of a multi-disciplinary discharge planning process, led by the attending physician.  Recommendations may be updated based on patient status, additional functional criteria and insurance authorization.  Follow Up Recommendations  Acute inpatient rehab (3hours/day)     Assistance Recommended at Discharge Frequent or constant Supervision/Assistance  Patient can return home with the following A little help with walking and/or transfers;Assistance with cooking/housework;Direct supervision/assist for medications management;Direct  supervision/assist for financial management;Assist for transportation;Help with stairs or ramp for entrance;A lot of help with bathing/dressing/bathroom   Equipment Recommendations  Rolling walker (2 wheels);BSC/3in1    Recommendations for Other Services       Precautions / Restrictions Precautions Precautions: Fall;Back (back sx on 04/26/22) Precaution Booklet Issued: No Precaution Comments: able to recall 3/3 back precautions Required Braces or Orthoses: Spinal Brace Spinal Brace: Lumbar corset;Applied in sitting position Spinal Brace Comments: LSO in room Restrictions Weight Bearing Restrictions: No     Mobility  Bed Mobility Overal bed mobility: Needs Assistance             General bed mobility comments: pt and pt's sister requesting assistance with repositioning pt in bed. Use of bed pads and bed in trendelenburg position, pt was able to reposition towards [facility] with mod A x2, pt was able to use her bilateral LEs to assist with movement    Transfers                        Ambulation/Gait                   Stairs             Wheelchair Mobility    Modified Rankin (Stroke Patients Only)       Balance                                            Cognition Arousal/Alertness: Lethargic Behavior During Therapy: WFL for tasks assessed/performed, Flat affect Overall Cognitive Status: Impaired/Different from baseline Area of Impairment: Following commands, Safety/judgement, Problem solving  Following Commands: Follows one step commands with increased time, Follows multi-step commands inconsistently, Follows multi-step commands with increased time Safety/Judgement: Decreased awareness of safety   Problem Solving: Slow processing, Requires verbal cues          Exercises General Exercises - Lower Extremity Ankle Circles/Pumps: AROM, Both, 10 reps, Supine Quad Sets: AROM, Strengthening,  Left, 10 reps, Supine Heel Slides: AAROM, Left, 10 reps, Supine    General Comments        Pertinent Vitals/Pain Pain Assessment Pain Assessment: No/denies pain    Home Living                          Prior Function            PT Goals (current goals can now be found in the care plan section) Acute Rehab PT Goals PT Goal Formulation: With patient Time For Goal Achievement: 06/25/22 Potential to Achieve Goals: Good Progress towards PT goals: Progressing toward goals    Frequency    Min 4X/week      PT Plan Current plan remains appropriate    Co-evaluation              AM-PAC PT "6 Clicks" Mobility   Outcome Measure  Help needed turning from your back to your side while in a flat bed without using bedrails?: A Little Help needed moving from lying on your back to sitting on the side of a flat bed without using bedrails?: A Little Help needed moving to and from a bed to a chair (including a wheelchair)?: A Little Help needed standing up from a chair using your arms (e.g., wheelchair or bedside chair)?: A Little Help needed to walk in hospital room?: A Little Help needed climbing 3-5 steps with a railing? : A Lot 6 Click Score: 17    End of Session   Activity Tolerance: Patient limited by fatigue;Patient limited by lethargy Patient left: in bed;with call bell/phone within reach;with family/visitor present Nurse Communication: Mobility status PT Visit Diagnosis: Unsteadiness on feet (R26.81);Muscle weakness (generalized) (M62.81);History of falling (Z91.81);Repeated falls (R29.6);Difficulty in walking, not elsewhere classified (R26.2);Dizziness and giddiness (R42)     Time: 1019-1050 PT Time Calculation (min) (ACUTE ONLY): 31 min  Charges:  $Therapeutic Exercise: 8-22 mins $Therapeutic Activity: 8-22 mins                     Anastasio Champion, DPT  Acute Rehabilitation Services Office [place] 06/15/2022, 11:25 AM

## 2022-06-15 NOTE — Progress Notes (Addendum)
Inpatient Rehab Admissions Coordinator:   I received another denial from Pt.'s insurance for CIR case. Pt.'s husband notified and he states that he plans to appeal decision.  I did let him know that Pt is medically stable for d/c, so it is not clear that Pt. Can remain in the hospital during an appeal process and that that is the decision of attending service.   Clemens Catholic, [place], [place] Admissions Coordinator  [phone] ([place]) [phone] (office)

## 2022-06-29 ENCOUNTER — Ambulatory Visit: Payer: PPO | Admitting: Gastroenterology

## 2022-07-04 ENCOUNTER — Other Ambulatory Visit (INDEPENDENT_AMBULATORY_CARE_PROVIDER_SITE_OTHER): Payer: PPO

## 2022-07-04 ENCOUNTER — Encounter: Payer: Self-pay | Admitting: Family Medicine

## 2022-07-04 ENCOUNTER — Ambulatory Visit (INDEPENDENT_AMBULATORY_CARE_PROVIDER_SITE_OTHER): Payer: PPO | Admitting: Family Medicine

## 2022-07-04 VITALS — BP 160/80 | HR 75 | Temp 98.0°F | Ht 63.0 in | Wt 130.4 lb

## 2022-07-04 DIAGNOSIS — F419 Anxiety disorder, unspecified: Secondary | ICD-10-CM

## 2022-07-04 DIAGNOSIS — R110 Nausea: Secondary | ICD-10-CM

## 2022-07-04 DIAGNOSIS — D500 Iron deficiency anemia secondary to blood loss (chronic): Secondary | ICD-10-CM

## 2022-07-04 DIAGNOSIS — I10 Essential (primary) hypertension: Secondary | ICD-10-CM

## 2022-07-04 DIAGNOSIS — N1832 Chronic kidney disease, stage 3b: Secondary | ICD-10-CM

## 2022-07-04 DIAGNOSIS — U071 COVID-19: Secondary | ICD-10-CM | POA: Diagnosis not present

## 2022-07-04 DIAGNOSIS — I63532 Cerebral infarction due to unspecified occlusion or stenosis of left posterior cerebral artery: Secondary | ICD-10-CM | POA: Diagnosis not present

## 2022-07-04 LAB — COMPREHENSIVE METABOLIC PANEL
ALT: 9 U/L (ref 0–35)
AST: 21 U/L (ref 0–37)
Albumin: 3.9 g/dL (ref 3.5–5.2)
Alkaline Phosphatase: 182 U/L — ABNORMAL HIGH (ref 39–117)
BUN: 20 mg/dL (ref 6–23)
CO2: 30 mEq/L (ref 19–32)
Calcium: 9.7 mg/dL (ref 8.4–10.5)
Chloride: 87 mEq/L — ABNORMAL LOW (ref 96–112)
Creatinine, Ser: 1.79 mg/dL — ABNORMAL HIGH (ref 0.40–1.20)
GFR: 28.17 mL/min — ABNORMAL LOW (ref >60.00)
Glucose, Bld: 102 mg/dL — ABNORMAL HIGH (ref 70–99)
Potassium: 2.9 mEq/L — ABNORMAL LOW (ref 3.5–5.1)
Sodium: 131 mEq/L — ABNORMAL LOW (ref 135–145)
Total Bilirubin: 0.5 mg/dL (ref 0.2–1.2)
Total Protein: 7.8 g/dL (ref 6.0–8.3)

## 2022-07-04 LAB — CBC WITH DIFFERENTIAL/PLATELET
Basophils Absolute: 0.1 10*3/uL (ref 0.0–0.1)
Basophils Relative: 1 % (ref 0.0–3.0)
Eosinophils Absolute: 0.2 10*3/uL (ref 0.0–0.7)
Eosinophils Relative: 2.1 % (ref 0.0–5.0)
HCT: 28.2 % — ABNORMAL LOW (ref 36.0–46.0)
Hemoglobin: 9.9 g/dL — ABNORMAL LOW (ref 12.0–15.0)
Lymphocytes Relative: 15.4 % (ref 12.0–46.0)
Lymphs Abs: 1.5 10*3/uL (ref 0.7–4.0)
MCHC: 35 g/dL (ref 30.0–36.0)
MCV: 92.2 fl (ref 78.0–100.0)
Monocytes Absolute: 0.8 10*3/uL (ref 0.1–1.0)
Monocytes Relative: 8.2 % (ref 3.0–12.0)
Neutro Abs: 6.9 10*3/uL (ref 1.4–7.7)
Neutrophils Relative %: 73.3 % (ref 43.0–77.0)
Platelets: 530 10*3/uL — ABNORMAL HIGH (ref 150.0–400.0)
RBC: 3.06 Mil/uL — ABNORMAL LOW (ref 3.87–5.11)
RDW: 14.5 % (ref 11.5–15.5)
WBC: 9.4 10*3/uL (ref 4.0–10.5)

## 2022-07-04 LAB — MAGNESIUM: Magnesium: 2 mg/dL (ref 1.5–2.5)

## 2022-07-04 MED ORDER — POTASSIUM CHLORIDE ER 20 MEQ PO TBCR: 20.0000 meq | EXTENDED_RELEASE_TABLET | Freq: Two times a day (BID) | ORAL | 0 refills | Status: DC

## 2022-07-04 MED ORDER — ALPRAZOLAM 0.5 MG PO TABS: 0.5000 mg | ORAL_TABLET | Freq: Two times a day (BID) | ORAL | 1 refills | Status: DC | PRN

## 2022-07-04 MED ORDER — ONDANSETRON HCL 4 MG PO TABS: 4.0000 mg | ORAL_TABLET | ORAL | 3 refills | Status: DC | PRN

## 2022-07-04 NOTE — Patient Instructions (Signed)
Miralax.  [email or web address]

## 2022-07-04 NOTE — Progress Notes (Unsigned)
New Patient Office Visit  Subjective:  Patient ID: Robin Arellano, female    DOB: 07/21/1951  Age: 71 y.o. MRN: [id number]  CC:  Chief Complaint  Patient presents with   Establish Care    Need new pcp    Hypertension    HPI-here w/sis-Robin Arellano,  daughter Robin Arellano presents for new pt-was seeing Dr. Nancy Arell[facility] at [facility]  HTN-Pt is on Norvasc, hydralazine, Catapres, chlorthalidone.  Bp's running  180-210.  Hydralazine makes nausea and tastes bitter   metoprolol in past caused pancreatitis.  Olmesartan on hold d/t AKI.  Has had renal duplex-neg CAD CKD IIIb-baseline cr 1.6-1.7-exacerbated in hospital.  Pt was unaware of dx. LPCA stroke-sent to rehab.  Readmitted to hosp for Covid and UTI. Had metabolic encephalopathy. On plavix,ASA,lipitor.  Home PT/OT-but only 3 visits each Anemia-hgb down to 6.9 in hosp-no bleed.  Got 1 uPRBC and stabilized feeling very fatigued Had L4-L5 posterior lumbar fusion 6/15.  Hosp 7/3 to 7/11 for L PCA infarct and penetrating desc aortic ulcer.sent to rehab  Readmitted 7/15 for fever and covid +.  Insurance wouldn't allow inpt rehab so at home and not doing well.   Home Care-hasn't started yet d/t "issue w/insurance".  Dau and sister helping some.  Did get OT/PT but limited for 3 visits.     Perif vision affected. Speech ok.  L leg weak.  Had back surg 6/15.    Sees Neuro in Oct for stroke f/u.    Constipated Nauseated so not eating. Dramamine not working, zofran not working long. Electrolytes were off in hosp. Dizziness Worse if bed flat. Eyes seem a little glazed per daughter.  Taking xanax daily 0.2m. for years.  For nerves.  Occ takes more freq. -needs refill   Past Medical History:  Diagnosis Date   Allergy    Anemia    Anxiety    on meds   Back pain    Blood transfusion without reported diagnosis    Cataract    Chronic female pelvic pain    Chronic kidney disease    Coronary artery disease    mild, non-obstructive 11/2018    Depression    on meds   Family history of adverse reaction to anesthesia    sister had difficulty waking up   Fibromyalgia    H/O leukocytosis    Headache    Heart murmur    Hyperlipidemia    on meds   Hypertension    on meds   [place]    Pt states she did not have a MI- EKG was normal, was GERD   Osteoarthritis    on meds   Ovarian cyst, right    PONV (postoperative nausea and vomiting)    Post-operative nausea and vomiting    [place]   L PCA   SVD (spontaneous vaginal delivery)    x 2   Vitamin D deficiency     Past Surgical History:  Procedure Laterality Date   ABDOMINAL HYSTERECTOMY  1994   TAH.BSO   ANTERIOR CERVICAL DECOMP/DISCECTOMY FUSION  2019   APPENDECTOMY  1975   BACK SURGERY  2023   BIOPSY  05/12/2020   Procedure: BIOPSY;  Surgeon: JMilus Banister MD;  Location: [facility];  Service: Endoscopy;;   CARDIAC CATHETERIZATION  2020   CHOLECYSTECTOMY N/A 07/21/2020   Procedure: LAPAROSCOPIC CHOLECYSTECTOMY WITH INTRAOPERATIVE CHOLANGIOGRAM;  Surgeon: BStark Klein MD;  [place] [facility]  Service: General;  Laterality: N/A;  COLONOSCOPY  08/12/2017   Hx TA (piecemeal)Jacobs-MAC-suprep (good)   ESOPHAGOGASTRODUODENOSCOPY (EGD) WITH PROPOFOL N/A 05/12/2020   Procedure: ESOPHAGOGASTRODUODENOSCOPY (EGD) WITH PROPOFOL;  Surgeon: Milus Banister, MD;  Location: [facility];  Service: Endoscopy;  Laterality: N/A;   EUS N/A 05/12/2020   Procedure: UPPER ENDOSCOPIC ULTRASOUND (EUS) RADIAL;  Surgeon: Milus Banister, MD;  Location: [facility];  Service: Endoscopy;  Laterality: N/A;   KNEE SURGERY Bilateral 1996   x 2 - arthroscopic   LEFT HEART CATH AND CORONARY ANGIOGRAPHY N/A 11/25/2018   Procedure: LEFT HEART CATH AND CORONARY ANGIOGRAPHY;  Surgeon: Troy Sine, MD;  [place] [facility];  Service: Cardiovascular;  Laterality: N/A;   PELVIC LAPAROSCOPY  1989   W LYSIS OF ADHESIONS/L SALPINGONEOSTOMY    TUBAL LIGATION     WISDOM TOOTH EXTRACTION      Family History  Problem Relation Age of Onset   18 / [place] Mother    Early death Mother    Cancer Mother    Uterine cancer Mother 28   Heart attack Mother    Arthritis Father    Cancer Father    Aneurysm Father    Other Sister        MGUS    Other Sister        MA   COPD Brother    Skin cancer Brother    Skin cancer Brother    Heart disease Paternal Grandfather    Colon cancer Neg Hx    Rectal cancer Neg Hx    Stomach cancer Neg Hx    Stroke Neg Hx    Neuropathy Neg Hx    Colon polyps Neg Hx    Esophageal cancer Neg Hx     Social History   Socioeconomic History   Marital status: Married    Spouse name: Robin Arellano   Number of children: 2   Years of education: College   Highest education level: Not on file  Occupational History   Occupation: Cabin crew  Tobacco Use   Smoking status: Former    Packs/day: 0.15    Years: 20.00    Total pack years: 3.00    Types: Cigarettes    Quit date: 11/12/1998    Years since quitting: 23.6   Smokeless tobacco: Never  Vaping Use   Vaping Use: Never used  Substance and Sexual Activity   Alcohol use: Not Currently   Drug use: No   Sexual activity: Not Currently    Birth control/protection: Post-menopausal, Surgical    Comment: HYSTERECTOMY  Other Topics Concern   Not on file  Social History Narrative   Lives at home with her husband Robin Arellano   Right handed   Caffeine: unsweet tea, 2 glasses daily   Social Determinants of Health   Financial Resource Strain: Not on file  Food Insecurity: Not on file  Transportation Needs: Not on file  Physical Activity: Not on file  Stress: Not on file  Social Connections: Not on file  Intimate Partner Violence: Not on file    ROS  ROS: Gen: no fever, chills  Skin: no rash, itching ENT: no ear pain, ear drainage, nasal congestion, rhinorrhea, sinus pressure, sore throat Eyes: no blurry vision, double vision Resp: no cough,  wheeze,SOB CV: no CP, palpitations, LE edema,  GI: no heartburn.  Chronic constipation issues.  Hasn't gone in several days GU: no dysuria, urgency, frequency, hematuria MSK: chronic Psych: no  SI   Objective:   Today's Vitals: BP (!) 160/80  Pulse 75   Temp 98 F (36.7 C) (Temporal)   Ht _0  (1.6 m)   Wt 130 lb 6 oz (59.1 kg)   LMP  (LMP Unknown)   SpO2 98%   BMI 23.09 kg/m   Physical Exam  Gen: WDWN tired wf in w/c HEENT: NCAT, conjunctiva not injected, sclera nonicteric NECK:  supple, no thyromegaly, no nodes, no carotid bruits CARDIAC: RRR, S1S2+, no murmur. LUNGS: CTAB. No wheezes ABDOMEN:  BS+, soft, NTND, No HSM, no masses EXT:  no edema MSK: in w/c  NEURO: A&O x3.  CN II-XII intact.  PSYCH: normal mood. Good eye contact   Spent 19mn reviewing records while nurse getting pt ready.  Then 345m w/pt and family.  Total 6066m Pt stated too tired to continue on [facility]  Assessment & Plan:   Problem List Items Addressed This Visit       Cardiovascular and Mediastinum   Essential hypertension   Relevant Medications   OLMESARTAN MEDOXOMIL PO   Other Relevant Orders   CBC with Differential/Platelet (Completed)   Comprehensive metabolic panel (Completed)   Magnesium (Completed)   Acute left PCA stroke (HCC) - Primary   Relevant Medications   OLMESARTAN MEDOXOMIL PO     Genitourinary   Chronic kidney disease, stage 3b (HCC)     Other   Anxiety   Relevant Medications   ALPRAZolam (XANAX) 0.5 MG tablet   COVID-19 virus infection   Other Visit Diagnoses     Nausea       Relevant Orders   CBC with Differential/Platelet (Completed)   Comprehensive metabolic panel (Completed)   Magnesium (Completed)   Iron deficiency anemia due to chronic blood loss       Relevant Orders   CBC with Differential/Platelet (Completed)     1.  Hypertension-chronic.  Not well controlled for very long time.  Currently on losartan is on hold due to acute  kidney injury on chronic kidney disease.  There were other pressing issues today, so no adjustments will be made.  Continue amlodipine 10 mg, chlorthalidone 25 mg, clonidine 0.1 mg at at bedtime, hydralazine 100 mg 3 times daily.  Advised to consider getting gelcaps to wrap the hydralazine as she is having trouble taking it due to bitter taste.  She has already tried putting it in applesauce etc. 2.  Chronic kidney disease stage IIIb-AKI while in hospital.  Need to check BMP.  Follow-up in 1 week and will probably refer to nephrology. 3.  Left PCA stroke-residual dizziness.  Possibly some left leg weakness (which may be coming from spine as well) she does have a few visits of home PT/OT. 4.  Anemia-suspect baseline anemia of chronic disease.  Worsened while in the hospital received 1 unit packed red blood cells.  Patient is very weak and tired.  Will recheck CBC, CMP, magnesium 5.  Intractable nausea-Zofran 4 mg helps but only for a few hours.  Advised that she can take it every 4-6 hours.  Can also try taking 8 mg at once and see if that holds better.  Encourage fluids. 6.  COVID-status post hospitalization.  Acute phase has recovered.  Some of her residual symptoms may be COVID, stroke, meds, electrolyte disturbances, etc. check CMP 7.  Anxiety-taking Xanax 0.5 mg at at bedtime and occasionally during the day as well.  They need a refill.  Started to explain this is not the ideal way to treat this and patient is  high risk.  For now, will renew and address at further office visit.  Outpatient Encounter Medications as of 07/04/2022  Medication Sig   albuterol (VENTOLIN HFA) 108 (90 Base) MCG/ACT inhaler Inhale 2 puffs into the lungs every 4 (four) hours as needed for wheezing or shortness of breath.   amLODipine (NORVASC) 10 MG tablet TAKE 1 TABLET(10 MG) BY MOUTH DAILY (Patient taking differently: Take 10 mg by mouth daily.)   Ascorbic Acid (VITAMIN C) 1000 MG tablet Take 1,000 mg by mouth in the  morning.   aspirin EC 81 MG tablet Take 81 mg by mouth as needed (chest pain). Swallow whole.   atorvastatin (LIPITOR) 40 MG tablet Take 1 tablet (40 mg total) by mouth daily.   butalbital-acetaminophen-caffeine (FIORICET) 50-325-40 MG tablet Take 1 tablet by mouth every 6 (six) hours as needed for headache or migraine.   Calcium Carbonate Antacid (TUMS PO) Take 2 tablets by mouth daily as needed (stomach pain).   chlorthalidone (HYGROTON) 25 MG tablet Take 1 tablet (25 mg total) by mouth daily.   Cholecalciferol (VITAMIN D) 50 MCG (2000 UT) tablet Take 2,000 Units by mouth in the morning.   cloNIDine (CATAPRES) 0.1 MG tablet Take 1 tablet (0.1 mg total) by mouth at bedtime.   ezetimibe (ZETIA) 10 MG tablet TAKE 1 TABLET(10 MG) BY MOUTH DAILY (Patient taking differently: Take 10 mg by mouth daily.)   hydrALAZINE (APRESOLINE) 100 MG tablet Take 1 tablet (100 mg total) by mouth 3 (three) times daily. (Patient taking differently: Take 100 mg by mouth in the morning.)   HYDROcodone-acetaminophen (NORCO) 10-325 MG tablet Take 1 tablet by mouth 3 (three) times daily as needed.   loperamide (IMODIUM) 2 MG capsule Take 1 capsule (2 mg total) by mouth as needed for diarrhea or loose stools.   methocarbamol (ROBAXIN) 500 MG tablet Take 1 tablet (500 mg total) by mouth every 6 (six) hours as needed for muscle spasms.   Multiple Vitamin (MULTIVITAMIN WITH MINERALS) TABS tablet Take 1 tablet by mouth daily.   nystatin (MYCOSTATIN) 100000 UNIT/ML suspension Take 5 mLs (500,000 Units total) by mouth 4 (four) times daily.   ondansetron (ZOFRAN) 4 MG tablet Take 1 tablet (4 mg total) by mouth every 4 (four) hours as needed for nausea or vomiting.   pantoprazole (PROTONIX) 40 MG tablet Take 1 tablet (40 mg total) by mouth daily.   polyethylene glycol powder (GLYCOLAX/MIRALAX) 17 GM/SCOOP powder Take 17 g by mouth daily as needed.   Potassium Chloride ER 20 MEQ TBCR Take 20 mEq by mouth in the morning and at bedtime.    topiramate (TOPAMAX) 25 MG tablet Take 1 tablet (25 mg total) by mouth 2 (two) times daily.   UNABLE TO FIND Med Name: Swiss Kriss   valACYclovir (VALTREX) 500 MG tablet TAKE 1 TABLET BY MOUTH TWICE DAILY FOR 3 TO 5 DAYS THEN TAKE DAILY AS NEEDED (Patient taking differently: Take 500 mg by mouth 2 (two) times daily as needed (shingles flair up).)   [DISCONTINUED] ALPRAZolam (XANAX) 1 MG tablet Take 0.5 tablets (0.5 mg total) by mouth daily as needed for sleep or anxiety.   [DISCONTINUED] omeprazole (PRILOSEC) 10 MG capsule Take 10 mg by mouth as needed.   [DISCONTINUED] ondansetron (ZOFRAN) 4 MG/2ML SOLN injection Inject 2 mLs (4 mg total) into the vein every 8 (eight) hours as needed for nausea or vomiting.   ALPRAZolam (XANAX) 0.5 MG tablet Take 1 tablet (0.5 mg total) by mouth 2 (two) times daily as  needed for sleep or anxiety.   OLMESARTAN MEDOXOMIL PO Take 40 mg by mouth daily.   [DISCONTINUED] guaiFENesin-dextromethorphan (ROBITUSSIN DM) 100-10 MG/5ML syrup Take 10 mLs by mouth every 4 (four) hours as needed for cough.   [DISCONTINUED] oxyCODONE (OXY IR/ROXICODONE) 5 MG immediate release tablet Take 1-2 tablets (5-10 mg total) by mouth every 4 (four) hours as needed for moderate pain or severe pain.   No facility-administered encounter medications on file as of 07/04/2022.    Follow-up: Return in about 1 week (around 07/11/2022) for f/u 1 wk.   Wellington Hampshire, MD

## 2022-07-05 ENCOUNTER — Telehealth: Payer: Self-pay | Admitting: Family Medicine

## 2022-07-05 NOTE — Telephone Encounter (Signed)
[name] given message below and verbalized understanding.

## 2022-07-05 NOTE — Telephone Encounter (Signed)
Caller states: -Patient did receive potassium chloride but is not able to swallow the pills due to size  - She has been trying to raise patient's potassium levels by feeding her a banana but this isn't helping  Caller requests: - A smaller or liquid version of the potassium be sent in so patient can intake this

## 2022-07-05 NOTE — Progress Notes (Signed)
Patient ID: Robin Arellano                 DOB: 1951/10/07                    MRN: [id number]     HPI: Robin Arellano is a 71 y.o. female of Dr. Claiborne Billings patient referred to lipid clinic by Dr. Maryland Pink. PMH is significant for HLD, CAD, HTN, angina, abdominal aortic aneurysm, abdominal aorta calcifications per CT abdomen on 06/02/22, anxiety, depression, fibromylagia, CKD3b. November 25, 2018 cath revealed mild multivessel nonobstructive CAD with narrowings no greater than 25% in the LAD, circumflex marginal, and dominant RCA. Recently admitted 05/22/22, she recently undergone lumbar surgery on 6/15 and fell at home. She was brought to the emergency department and evaluated for possible acute left PCA stroke. Dr. Maryland Pink referred pt to lipid clinic.   Pt presents to PharmD clinic today with sister and daughter. Daughter takes care of the patient. Pt is unable to eat due to constant nausea and is tired all of the time. She cannot even use her walker due to being so fatigued and requires a wheelchair. She is only able to eat a little bit of food at a time. She only has 2 bottles of water a day which she sips throughout the day and has a bad taste in the mouth and vertigo. She is taking zofran for nausea but is nauseous despite this. Daughter shares that there have been some recent high blood pressures at home, systolics of 086 and 578I which reduced to 154 after medications. Patient recently saw PCP and labs were obtained on 07/04/22. Potassium came back quite low at 2.9 and patient was prescribed potassium pills. Her sodium was also low at 131 and creatinine high at 1.79. However, daughter says she has only been able to give one pill to her and this caused her stomach to "feel like knives were in it," since she has not been eating. Family members express concern that patient is very weak and unable to eat or stay hydrated due to nausea and wonder if she needs to go to the emergency department for IV  fluids and to replace electrolytes.   Current HTN meds: amlodipine 10 mg daily, olmesartan 40 mg daily, and hydralazine 100 mg three times daily, chlorthalidone 25 mg daily Current lipid medications: atorvastatin 40 mg daily, ezetimibe 10 mg daily  Intolerances: rosuvastatin 10, 20, and 40 mg daily, rosuvastatin 5 mg every MWF Risk Factors: CAD, HTN, HLD LDL goal: <70, ideally <55  Diet: poor intake, unable to eat much or drink   Exercise: requires wheelchair   Family History: father- aortic aneurysm, paternal grandfather- heart disease   Social History: former smoker, doesn't drink   Labs: 07/04/22- Na 131, K 2.9, Scr 1.79  06/03/22- TC 167, TG 271, HDL 38, LDL-C 75 (atorvastatin 40 mg, ezetimibe 10 mg daily)   Past Medical History:  Diagnosis Date   Allergy    Anemia    Anxiety    on meds   Back pain    Blood transfusion without reported diagnosis    Cataract    Chronic female pelvic pain    Chronic kidney disease    Coronary artery disease    mild, non-obstructive 11/2018   Depression    on meds   Family history of adverse reaction to anesthesia    sister had difficulty waking up   Fibromyalgia    H/O leukocytosis    Headache  Heart murmur    Hyperlipidemia    on meds   Hypertension    on meds   MI (myocardial infarction) ([place])    Pt states she did not have a MI- EKG was normal, was GERD   Osteoarthritis    on meds   Ovarian cyst, right    PONV (postoperative nausea and vomiting)    Post-operative nausea and vomiting    Stroke ([place]) 05/14/2022   L PCA   SVD (spontaneous vaginal delivery)    x 2   Vitamin D deficiency     Current Outpatient Medications on File Prior to Visit  Medication Sig Dispense Refill   albuterol (VENTOLIN HFA) 108 (90 Base) MCG/ACT inhaler Inhale 2 puffs into the lungs every 4 (four) hours as needed for wheezing or shortness of breath. 8 g 0   ALPRAZolam (XANAX) 0.5 MG tablet Take 1 tablet (0.5 mg total) by mouth 2 (two) times  daily as needed for sleep or anxiety. 30 tablet 1   amLODipine (NORVASC) 10 MG tablet TAKE 1 TABLET(10 MG) BY MOUTH DAILY (Patient taking differently: Take 10 mg by mouth daily.) 90 tablet 3   Ascorbic Acid (VITAMIN C) 1000 MG tablet Take 1,000 mg by mouth in the morning.     aspirin EC 81 MG tablet Take 81 mg by mouth as needed (chest pain). Swallow whole.     atorvastatin (LIPITOR) 40 MG tablet Take 1 tablet (40 mg total) by mouth daily. 30 tablet 2   butalbital-acetaminophen-caffeine (FIORICET) 50-325-40 MG tablet Take 1 tablet by mouth every 6 (six) hours as needed for headache or migraine. 14 tablet 0   Calcium Carbonate Antacid (TUMS PO) Take 2 tablets by mouth daily as needed (stomach pain).     chlorthalidone (HYGROTON) 25 MG tablet Take 1 tablet (25 mg total) by mouth daily. 30 tablet 0   Cholecalciferol (VITAMIN D) 50 MCG (2000 UT) tablet Take 2,000 Units by mouth in the morning.     cloNIDine (CATAPRES) 0.1 MG tablet Take 1 tablet (0.1 mg total) by mouth at bedtime. 30 tablet 2   ezetimibe (ZETIA) 10 MG tablet TAKE 1 TABLET(10 MG) BY MOUTH DAILY (Patient taking differently: Take 10 mg by mouth daily.) 90 tablet 3   hydrALAZINE (APRESOLINE) 100 MG tablet Take 1 tablet (100 mg total) by mouth 3 (three) times daily. (Patient taking differently: Take 100 mg by mouth in the morning.) 270 tablet 1   HYDROcodone-acetaminophen (NORCO) 10-325 MG tablet Take 1 tablet by mouth 3 (three) times daily as needed.     loperamide (IMODIUM) 2 MG capsule Take 1 capsule (2 mg total) by mouth as needed for diarrhea or loose stools. 30 capsule 0   methocarbamol (ROBAXIN) 500 MG tablet Take 1 tablet (500 mg total) by mouth every 6 (six) hours as needed for muscle spasms. 30 tablet 3   Multiple Vitamin (MULTIVITAMIN WITH MINERALS) TABS tablet Take 1 tablet by mouth daily. 30 tablet 0   nystatin (MYCOSTATIN) 100000 UNIT/ML suspension Take 5 mLs (500,000 Units total) by mouth 4 (four) times daily. 60 mL 0    OLMESARTAN MEDOXOMIL PO Take 40 mg by mouth daily.     ondansetron (ZOFRAN) 4 MG tablet Take 1 tablet (4 mg total) by mouth every 4 (four) hours as needed for nausea or vomiting. 60 tablet 3   pantoprazole (PROTONIX) 40 MG tablet Take 1 tablet (40 mg total) by mouth daily. 30 tablet 0   polyethylene glycol powder (GLYCOLAX/MIRALAX) 17 GM/SCOOP powder  Take 17 g by mouth daily as needed. 238 g 0   Potassium Chloride ER 20 MEQ TBCR Take 20 mEq by mouth in the morning and at bedtime. 60 tablet 0   topiramate (TOPAMAX) 25 MG tablet Take 1 tablet (25 mg total) by mouth 2 (two) times daily. 60 tablet 1   UNABLE TO FIND Med Name: Swiss Kriss     valACYclovir (VALTREX) 500 MG tablet TAKE 1 TABLET BY MOUTH TWICE DAILY FOR 3 TO 5 DAYS THEN TAKE DAILY AS NEEDED (Patient taking differently: Take 500 mg by mouth 2 (two) times daily as needed (shingles flair up).) 30 tablet 0   No current facility-administered medications on file prior to visit.    Allergies  Allergen Reactions   Flexeril [Cyclobenzaprine] Anaphylaxis, Hives, Itching and Swelling   Lidocaine Hives   Zanaflex [Tizanidine] Anaphylaxis, Hives, Itching and Swelling   Neurontin [Gabapentin] Itching and Swelling   Latex Rash    Assessment/Plan:  1. HTN-  Patient has had poor oral intake due to nausea and is unable to take prescribed potassium tablets. Due to low electrolytes and high serum creatinine on BMET, will stop chlorthalidone. Changing clonidine to 0.1 mg twice daily instead of once daily. Will refer other blood pressure medication changes to PCP when patient follows up on 9/1. Patient was advised that if she continues to feel poorly or if her symptoms get any worse she is to go to the ER.   2. Hyperlipidemia - This was not discussed during this visit due to patients acute concerns with weakness, dehydration and concerning lab values. Patient can continue atorvastatin 40 mg daily and ezetimibe 10 mg daily.   Thank you,  Eliseo Gum, PharmD PGY1 Pharmacy Resident   07/06/2022  2:57 PM   Ramond Dial, Pharm.D, BCPS, CPP [place]  [address], [place], [place] [address]  Phone: [phone]; Fax: [phone]

## 2022-07-05 NOTE — Telephone Encounter (Signed)
Please advise 

## 2022-07-06 ENCOUNTER — Ambulatory Visit: Payer: PPO | Admitting: Pharmacist

## 2022-07-06 DIAGNOSIS — I2510 Atherosclerotic heart disease of native coronary artery without angina pectoris: Secondary | ICD-10-CM | POA: Diagnosis not present

## 2022-07-06 DIAGNOSIS — I10 Essential (primary) hypertension: Secondary | ICD-10-CM | POA: Diagnosis not present

## 2022-07-06 DIAGNOSIS — E782 Mixed hyperlipidemia: Secondary | ICD-10-CM

## 2022-07-06 MED ORDER — CLONIDINE HCL 0.1 MG PO TABS: 0.1000 mg | ORAL_TABLET | Freq: Two times a day (BID) | ORAL | 2 refills | Status: DC

## 2022-07-06 NOTE — Patient Instructions (Addendum)
Stop taking chlorthalidone.  Start taking clonidine 0.1 mg twice daily.   Continue taking amlodipine 10 mg daily, olmesartan 40 mg daily, and hydralazine 100 mg three times daily.  Let primary care provider know about why we stopped chlorthalidone (low electrolytes and high creatinine).

## 2022-07-10 ENCOUNTER — Telehealth: Payer: Self-pay | Admitting: Family Medicine

## 2022-07-10 ENCOUNTER — Ambulatory Visit: Payer: PPO | Admitting: Nurse Practitioner

## 2022-07-10 NOTE — Telephone Encounter (Signed)
Patient's sister Bethena Roys requests Patient receive Custodial Care from [facility]. [facility] requires Patient's DOB, discharge code ICD-10, Request for 20 hours custodial care, Provider-[facility] Custod[facility] be faxed to: Fax# [phone]  If any questions or concerns Bethena Roys requests to be called at ph# [phone]

## 2022-07-10 NOTE — Telephone Encounter (Signed)
Please advise 

## 2022-07-12 ENCOUNTER — Other Ambulatory Visit: Payer: Self-pay | Admitting: Family Medicine

## 2022-07-12 ENCOUNTER — Encounter: Payer: Self-pay | Admitting: Family Medicine

## 2022-07-12 NOTE — Telephone Encounter (Signed)
Letter completed and faxed to [facility].

## 2022-07-13 ENCOUNTER — Encounter: Payer: Self-pay | Admitting: Family Medicine

## 2022-07-13 ENCOUNTER — Ambulatory Visit (INDEPENDENT_AMBULATORY_CARE_PROVIDER_SITE_OTHER): Payer: PPO | Admitting: Family Medicine

## 2022-07-13 ENCOUNTER — Other Ambulatory Visit: Payer: Self-pay | Admitting: Family Medicine

## 2022-07-13 VITALS — BP 130/60 | HR 59 | Temp 98.0°F | Ht 63.0 in | Wt 134.2 lb

## 2022-07-13 DIAGNOSIS — E871 Hypo-osmolality and hyponatremia: Secondary | ICD-10-CM | POA: Diagnosis not present

## 2022-07-13 DIAGNOSIS — N1832 Chronic kidney disease, stage 3b: Secondary | ICD-10-CM

## 2022-07-13 DIAGNOSIS — R519 Headache, unspecified: Secondary | ICD-10-CM

## 2022-07-13 DIAGNOSIS — D500 Iron deficiency anemia secondary to blood loss (chronic): Secondary | ICD-10-CM

## 2022-07-13 DIAGNOSIS — I10 Essential (primary) hypertension: Secondary | ICD-10-CM | POA: Diagnosis not present

## 2022-07-13 DIAGNOSIS — I2510 Atherosclerotic heart disease of native coronary artery without angina pectoris: Secondary | ICD-10-CM

## 2022-07-13 DIAGNOSIS — E876 Hypokalemia: Secondary | ICD-10-CM | POA: Diagnosis not present

## 2022-07-13 LAB — COMPREHENSIVE METABOLIC PANEL
ALT: 8 U/L (ref 0–35)
AST: 17 U/L (ref 0–37)
Albumin: 3.3 g/dL — ABNORMAL LOW (ref 3.5–5.2)
Alkaline Phosphatase: 141 U/L — ABNORMAL HIGH (ref 39–117)
BUN: 15 mg/dL (ref 6–23)
CO2: 27 mEq/L (ref 19–32)
Calcium: 9 mg/dL (ref 8.4–10.5)
Chloride: 98 mEq/L (ref 96–112)
Creatinine, Ser: 1.47 mg/dL — ABNORMAL HIGH (ref 0.40–1.20)
GFR: 35.67 mL/min — ABNORMAL LOW (ref >60.00)
Glucose, Bld: 94 mg/dL (ref 70–99)
Potassium: 4.3 mEq/L (ref 3.5–5.1)
Sodium: 134 mEq/L — ABNORMAL LOW (ref 135–145)
Total Bilirubin: 0.3 mg/dL (ref 0.2–1.2)
Total Protein: 6.7 g/dL (ref 6.0–8.3)

## 2022-07-13 LAB — CBC WITH DIFFERENTIAL/PLATELET
Basophils Absolute: 0.1 10*3/uL (ref 0.0–0.1)
Basophils Relative: 0.9 % (ref 0.0–3.0)
Eosinophils Absolute: 0.3 10*3/uL (ref 0.0–0.7)
Eosinophils Relative: 2.5 % (ref 0.0–5.0)
HCT: 27.4 % — ABNORMAL LOW (ref 36.0–46.0)
Hemoglobin: 9.4 g/dL — ABNORMAL LOW (ref 12.0–15.0)
Lymphocytes Relative: 15.8 % (ref 12.0–46.0)
Lymphs Abs: 1.7 10*3/uL (ref 0.7–4.0)
MCHC: 34.4 g/dL (ref 30.0–36.0)
MCV: 95 fl (ref 78.0–100.0)
Monocytes Absolute: 0.8 10*3/uL (ref 0.1–1.0)
Monocytes Relative: 7.4 % (ref 3.0–12.0)
Neutro Abs: 7.7 10*3/uL (ref 1.4–7.7)
Neutrophils Relative %: 73.4 % (ref 43.0–77.0)
Platelets: 360 10*3/uL (ref 150.0–400.0)
RBC: 2.88 Mil/uL — ABNORMAL LOW (ref 3.87–5.11)
RDW: 15.1 % (ref 11.5–15.5)
WBC: 10.5 10*3/uL (ref 4.0–10.5)

## 2022-07-13 MED ORDER — ATORVASTATIN CALCIUM 40 MG PO TABS
40.0000 mg | ORAL_TABLET | Freq: Every day | ORAL | 1 refills | Status: DC
Start: 2022-07-13 — End: 2022-07-18

## 2022-07-13 MED ORDER — TOPIRAMATE 25 MG PO TABS
25.0000 mg | ORAL_TABLET | Freq: Two times a day (BID) | ORAL | 1 refills | Status: DC
Start: 2022-07-13 — End: 2022-07-13

## 2022-07-13 MED ORDER — BUTALBITAL-APAP-CAFFEINE 50-325-40 MG PO TABS
1.0000 | ORAL_TABLET | Freq: Four times a day (QID) | ORAL | 0 refills | Status: DC | PRN
Start: 2022-07-13 — End: 2022-08-17

## 2022-07-13 NOTE — Patient Instructions (Signed)
Cal dr. Ellene Route about pain meds and muscle relaxor.

## 2022-07-13 NOTE — Progress Notes (Unsigned)
Subjective:     Patient ID: Robin Arellano, female    DOB: [birth year], 71 y.o.   MRN: [id number]  Chief Complaint  Patient presents with   Follow-up    1 week follow-up Left arm and left leg weakness Right lower side and back pain Recheck rash Referral to nephrology Need meds refilled    HPI 1  CKD-refer to Dr. Rance Muir never seen neph.  Olmesartan held in hosp d/t inc creat. 2.  HTN-needing to see new Card-requesting Dr. Johney Frame. Bp's running 130's now 3.  CVA-still L leg weakness-but that may be lumbar per pt  has some limited PT d/t insurance.  .  4.  HA-fioricet 1-2x/wk.  Ice packs as well.  Pt can't recall if on topamax or not.  Gets a lot of HA, but freq will only take tylenol.   5.  Back pain-chronic Pain mgmt.  Dr. Ellene Route and pain mgmt. Needs meds refilled.  6.  Hypokalemia-taking supps.  Feeling some better.     There are no preventive care reminders to display for this patient.  Past Medical History:  Diagnosis Date   Allergy    Anemia    Anxiety    on meds   Back pain    Blood transfusion without reported diagnosis    Cataract    Chronic female pelvic pain    Chronic kidney disease    Coronary artery disease    mild, non-obstructive 11/2018   Depression    on meds   Family history of adverse reaction to anesthesia    sister had difficulty waking up   Fibromyalgia    H/O leukocytosis    Headache    Heart murmur    Hyperlipidemia    on meds   Hypertension    on meds   MI (myocardial infarction) ([place])    Pt states she did not have a MI- EKG was normal, was GERD   Osteoarthritis    on meds   Ovarian cyst, right    PONV (postoperative nausea and vomiting)    Post-operative nausea and vomiting    Stroke ([place]) 05/14/2022   L PCA   SVD (spontaneous vaginal delivery)    x 2   Vitamin D deficiency     Past Surgical History:  Procedure Laterality Date   ABDOMINAL HYSTERECTOMY  1994   TAH.BSO   ANTERIOR CERVICAL DECOMP/DISCECTOMY  FUSION  2019   APPENDECTOMY  1975   BACK SURGERY  2023   BIOPSY  05/12/2020   Procedure: BIOPSY;  Surgeon: Milus Banister, MD;  Location: [facility];  Service: Endoscopy;;   CARDIAC CATHETERIZATION  2020   CHOLECYSTECTOMY N/A 07/21/2020   Procedure: LAPAROSCOPIC CHOLECYSTECTOMY WITH INTRAOPERATIVE CHOLANGIOGRAM;  Surgeon: [name], MD;  Location: [facility];  Service: General;  Laterality: N/A;   COLONOSCOPY  08/12/2017   Hx TA (piecemeal)Jacobs-MAC-suprep (good)   ESOPHAGOGASTRODUODENOSCOPY (EGD) WITH PROPOFOL N/A 05/12/2020   Procedure: ESOPHAGOGASTRODUODENOSCOPY (EGD) WITH PROPOFOL;  Surgeon: Milus Banister, MD;  Location: [facility];  Service: Endoscopy;  Laterality: N/A;   EUS N/A 05/12/2020   Procedure: UPPER ENDOSCOPIC ULTRASOUND (EUS) RADIAL;  Surgeon: Milus Banister, MD;  Location: [facility];  Service: Endoscopy;  Laterality: N/A;   KNEE SURGERY Bilateral 1996   x 2 - arthroscopic   LEFT HEART CATH AND CORONARY ANGIOGRAPHY N/A 11/25/2018   Procedure: LEFT HEART CATH AND CORONARY ANGIOGRAPHY;  Surgeon: Troy Sine, MD;  Location: [facility];  Service: Cardiovascular;  Laterality:  N/A;   PELVIC LAPAROSCOPY  1989   W LYSIS OF ADHESIONS/L SALPINGONEOSTOMY   TUBAL LIGATION     WISDOM TOOTH EXTRACTION      Outpatient Medications Prior to Visit  Medication Sig Dispense Refill   albuterol (VENTOLIN HFA) 108 (90 Base) MCG/ACT inhaler Inhale 2 puffs into the lungs every 4 (four) hours as needed for wheezing or shortness of breath. 8 g 0   ALPRAZolam (XANAX) 0.5 MG tablet Take 1 tablet (0.5 mg total) by mouth 2 (two) times daily as needed for sleep or anxiety. 30 tablet 1   amLODipine (NORVASC) 10 MG tablet TAKE 1 TABLET(10 MG) BY MOUTH DAILY (Patient taking differently: Take 10 mg by mouth daily.) 90 tablet 3   Ascorbic Acid (VITAMIN C) 1000 MG tablet Take 1,000 mg by mouth in the morning.     aspirin EC 81 MG tablet Take 81 mg by mouth as needed (chest pain).  Swallow whole.     Calcium Carbonate Antacid (TUMS PO) Take 2 tablets by mouth daily as needed (stomach pain).     Cholecalciferol (VITAMIN D) 50 MCG (2000 UT) tablet Take 2,000 Units by mouth in the morning.     cloNIDine (CATAPRES) 0.1 MG tablet Take 1 tablet (0.1 mg total) by mouth 2 (two) times daily. 30 tablet 2   ezetimibe (ZETIA) 10 MG tablet TAKE 1 TABLET(10 MG) BY MOUTH DAILY (Patient taking differently: Take 10 mg by mouth daily.) 90 tablet 3   hydrALAZINE (APRESOLINE) 100 MG tablet Take 1 tablet (100 mg total) by mouth 3 (three) times daily. (Patient taking differently: Take 100 mg by mouth in the morning.) 270 tablet 1   HYDROcodone-acetaminophen (NORCO) 10-325 MG tablet Take 1 tablet by mouth 3 (three) times daily as needed.     loperamide (IMODIUM) 2 MG capsule Take 1 capsule (2 mg total) by mouth as needed for diarrhea or loose stools. 30 capsule 0   methocarbamol (ROBAXIN) 500 MG tablet Take 1 tablet (500 mg total) by mouth every 6 (six) hours as needed for muscle spasms. 30 tablet 3   Multiple Vitamin (MULTIVITAMIN WITH MINERALS) TABS tablet Take 1 tablet by mouth daily. 30 tablet 0   nystatin (MYCOSTATIN) 100000 UNIT/ML suspension Take 5 mLs (500,000 Units total) by mouth 4 (four) times daily. 60 mL 0   OLMESARTAN MEDOXOMIL PO Take 40 mg by mouth daily.     ondansetron (ZOFRAN) 4 MG tablet Take 1 tablet (4 mg total) by mouth every 4 (four) hours as needed for nausea or vomiting. 60 tablet 3   pantoprazole (PROTONIX) 40 MG tablet TAKE 1 TABLET(40 MG) BY MOUTH DAILY 90 tablet 3   polyethylene glycol powder (GLYCOLAX/MIRALAX) 17 GM/SCOOP powder Take 17 g by mouth daily as needed. 238 g 0   Potassium Chloride ER 20 MEQ TBCR Take 20 mEq by mouth in the morning and at bedtime. 60 tablet 0   UNABLE TO FIND Med Name: Swiss Kriss     valACYclovir (VALTREX) 500 MG tablet TAKE 1 TABLET BY MOUTH TWICE DAILY FOR 3 TO 5 DAYS THEN TAKE DAILY AS NEEDED (Patient taking differently: Take 500 mg by  mouth 2 (two) times daily as needed (shingles flair up).) 30 tablet 0   atorvastatin (LIPITOR) 40 MG tablet Take 1 tablet (40 mg total) by mouth daily. 30 tablet 2   butalbital-acetaminophen-caffeine (FIORICET) 50-325-40 MG tablet Take 1 tablet by mouth every 6 (six) hours as needed for headache or migraine. 14 tablet 0   topiramate (  TOPAMAX) 25 MG tablet Take 1 tablet (25 mg total) by mouth 2 (two) times daily. 60 tablet 1   No facility-administered medications prior to visit.    Allergies  Allergen Reactions   Flexeril [Cyclobenzaprine] Anaphylaxis, Hives, Itching and Swelling   Lidocaine Hives   Zanaflex [Tizanidine] Anaphylaxis, Hives, Itching and Swelling   Neurontin [Gabapentin] Itching and Swelling   Latex Rash   ROS neg/noncontributory except as noted HPI/below Tired all the time.    Still nauseated all the time.  Zofran helps some.  Able to eat better.   No edema A lot of back pain  Urine dark  No f/c.        Objective:     BP 130/60   Pulse (!) 59   Temp 98 F (36.7 C) (Temporal)   Ht _0  (1.6 m)   Wt 134 lb 4 oz (60.9 kg)   LMP  (LMP Unknown)   SpO2 97%   BMI 23.78 kg/m  Wt Readings from Last 3 Encounters:  07/13/22 134 lb 4 oz (60.9 kg)  07/04/22 130 lb 6 oz (59.1 kg)  06/11/22 140 lb 10.5 oz (63.8 kg)    Physical Exam   Gen: WDWN NAD wf-looks much better than last ov.  HEENT: NCAT, conjunctiva not injected, sclera nonicteric NECK:  supple, no thyromegaly, no nodes, no carotid bruits CARDIAC: RRR, S1S2+, no murmur. DP 2+B LUNGS: CTAB. No wheezes ABDOMEN:  BS+, soft, NTND, No HSM, no masses EXT:  no edema MSK: in w/c  NEURO: A&O x3.  CN II-XII intact.  PSYCH: normal mood. Good eye contact  Nothing in PDMP    Assessment & Plan:   Problem List Items Addressed This Visit       Cardiovascular and Mediastinum   Coronary artery disease involving native coronary artery of native heart without angina pectoris   Relevant Medications    atorvastatin (LIPITOR) 40 MG tablet   Essential hypertension - Primary   Relevant Medications   atorvastatin (LIPITOR) 40 MG tablet   Other Relevant Orders   Comprehensive metabolic panel (Completed)     Genitourinary   Chronic kidney disease, stage 3b (HCC)     Other   Headache   Relevant Medications   butalbital-acetaminophen-caffeine (FIORICET) 50-325-40 MG tablet   Hypokalemia   Relevant Orders   Comprehensive metabolic panel (Completed)   Hyponatremia   Relevant Orders   Comprehensive metabolic panel (Completed)   Other Visit Diagnoses     Iron deficiency anemia due to chronic blood loss       Relevant Orders   CBC with Differential/Platelet (Completed)     1.  Hypertension-chronic.  Currently controlled on amlodipine 10 mg, clonidine 0.1 mg twice daily, hydralazine 100 mg 3 times daily.  Requesting referral to cardiology Dr. Johney Frame for assistance with blood pressure management 2.  Coronary artery disease, aneurysms-patient requesting transfer of care/referral to cardiology Dr. Johney Frame 3.  CKD stage IIIb-chronic.  Creatinine has been fluctuating, has had several hospitalizations.  Olmesartan is currently on hold.  Blood pressures have been difficult to control for many years.  Refer to nephrology 4.  Hypokalemia, hyponatremia-patient is currently taking potassium chloride 20 mEq twice daily.  Recheck CMP 5.  Iron deficiency anemia-probably of chronic disease.  She did have blood transfusion in the hospital due to low hemoglobin.  Will repeat CBC 6.  Chronic low back pain-had surgery couple of months ago.  Followed by pain clinic and Dr. Ellene Route.  Advised that she will have  to get medications from him or pain clinic. 7.  Headache-chronic.  She is not sure if she is taking Topamax or not.  Advised to restart 25 mg twice daily (or continue) refilled Fioricet.  Advised to use sparingly if she may get rebound headache.  Follow-up in 4 weeks    refer  Meds ordered this  encounter  Medications   atorvastatin (LIPITOR) 40 MG tablet    Sig: Take 1 tablet (40 mg total) by mouth daily.    Dispense:  90 tablet    Refill:  1   DISCONTD: topiramate (TOPAMAX) 25 MG tablet    Sig: Take 1 tablet (25 mg total) by mouth 2 (two) times daily.    Dispense:  60 tablet    Refill:  1   butalbital-acetaminophen-caffeine (FIORICET) 50-325-40 MG tablet    Sig: Take 1 tablet by mouth every 6 (six) hours as needed for headache or migraine.    Dispense:  14 tablet    Refill:  0    Wellington Hampshire, MD

## 2022-07-17 ENCOUNTER — Other Ambulatory Visit: Payer: Self-pay | Admitting: *Deleted

## 2022-07-17 ENCOUNTER — Other Ambulatory Visit: Payer: Self-pay | Admitting: Family Medicine

## 2022-07-17 ENCOUNTER — Telehealth: Payer: Self-pay | Admitting: Family Medicine

## 2022-07-17 DIAGNOSIS — E876 Hypokalemia: Secondary | ICD-10-CM

## 2022-07-17 DIAGNOSIS — D649 Anemia, unspecified: Secondary | ICD-10-CM

## 2022-07-17 DIAGNOSIS — Z1231 Encounter for screening mammogram for malignant neoplasm of breast: Secondary | ICD-10-CM

## 2022-07-17 NOTE — Telephone Encounter (Addendum)
Daughter has called in for patient.  States patient has had a dull pain in her right breast with occasional sharpe pain with movement for 3 days.  I have sent message through to team health.  Due to daughters cell phone service, she was not sure it would work in patient location.  I have given [facility] Daughters and patients phone numbers to follow up.

## 2022-07-17 NOTE — Telephone Encounter (Signed)
FYI

## 2022-07-17 NOTE — Telephone Encounter (Signed)
Pt's daughter called in stating pt is refusing to go to ED. They will be in tomorrow but is asking if something opens up sooner, please call daughter to schedule.

## 2022-07-17 NOTE — Telephone Encounter (Signed)
Patient Name: Robin Arellano Gender: Female DOB: 03-07-1951 Age: 71 Y 41 M 18 D Return Phone Number: [phone] (Primary), [phone] (Secondary) Address: City/ State/ Zip: [place]  [address] Client [place] [facility] [place] [facility] Contact Type Call Who Is Calling Patient / Member / Family / Caregiver Call Type Triage / Clinical Caller Name Verdis Prime Relationship To Patient Daughter Return Phone Number [phone] (Primary) Chief Complaint Breast Symptoms Reason for Call Symptomatic / Request for [place] states she has a daughter of a pt on the line because the pt is having pain in her right breast for 3-4 days and got out of the hospital and has been diagnosed with cancer. The appt is scheduled tomorrow at 2, but just wants to make sure that is the right route and doesn't need to go back to the hospital. Additional Comment Pt. sees Dr. Joelyn Oms, call came from office. Translation No Nurse Assessment Nurse: Lucky Cowboy, RN, Levada Dy Date/Time (Eastern Time): 07/17/2022 1:28:28 PM Confirm and document reason for call. If symptomatic, describe symptoms. ---Caller stated that she has rt breast pain that started 3-4 days, sharp, intermittent. Lying down makes it better. Going to the bathroom makes it worse. Does the patient have any new or worsening symptoms? ---Yes Will a triage be completed? ---Yes Related visit to physician within the last 2 weeks? ---Yes Does the PT have any chronic conditions? (i.e. diabetes, asthma, this includes High risk factors for pregnancy, etc.) ---Yes List chronic conditions. ---HTN Is this a behavioral health or substance abuse call? ---No  Guidelines Guideline Title Affirmed Question Affirmed Notes Nurse Date/Time (Eastern Time) Breast Symptoms [1] Breast pain AND [2] cause is not known Dew, RN, Levada Dy 07/17/2022 1:30:26  PM Disp. Time Eilene Ghazi Time) Disposition Final User 07/17/2022 1:34:15 PM Go to ED Now (or PCP triage) Yes Lucky Cowboy, RN, Levada Dy Final Disposition 07/17/2022 1:34:15 PM Go to ED Now (or PCP triage) Yes Lucky Cowboy, RN, Levada Dy Disposition Overriden: See PCP within 2 Weeks Override Reason: Patient's symptoms need a higher level of care Caller Disagree/Comply Comply Caller Understands Yes PreDisposition Call Doctor Care Advice Given Per Guideline GO TO ED NOW (OR PCP TRIAGE): * IF NO PCP (PRIMARY CARE PROVIDER) SECOND-LEVEL TRIAGE: You need to be seen within the next hour. Go to the [facility] at _____________ [place] as soon as you can. ANOTHER ADULT SHOULD DRIVE: * It is better and safer if another adult drives instead of you. CARE ADVICE given per Breast Symptoms (Adult) guideline. Comments User: Raford Pitcher, RN Date/Time Eilene Ghazi Time): 07/17/2022 1:27:07 PM Caller is asking some general questions about her mother. Her mother has 2 pseudo-aneurysms. She is not with her. She has CKD 3. She has rt breast pain for 3-4 days. She has COVID. She was kicked out of rehab. She had fallen and was in the hospital until 8/5. She is asleep and dtr is not with her. User: Raford Pitcher, RN Date/Time Eilene Ghazi Time): 07/17/2022 1:35:19 PM Pt is going to wait to be seen at the breast center on Friday. I stressed that she needs to be seen more urgently, but she is refusing. She has isolated the pain to the breast itself. Referrals GO TO FACILITY REFUSED

## 2022-07-17 NOTE — Telephone Encounter (Signed)
Please advise 

## 2022-07-18 ENCOUNTER — Ambulatory Visit (INDEPENDENT_AMBULATORY_CARE_PROVIDER_SITE_OTHER): Payer: PPO | Admitting: Family Medicine

## 2022-07-18 ENCOUNTER — Encounter: Payer: Self-pay | Admitting: Family Medicine

## 2022-07-18 ENCOUNTER — Other Ambulatory Visit: Payer: Self-pay | Admitting: Family Medicine

## 2022-07-18 VITALS — BP 120/60 | HR 52 | Temp 98.2°F | Ht 63.0 in | Wt 134.2 lb

## 2022-07-18 DIAGNOSIS — N644 Mastodynia: Secondary | ICD-10-CM | POA: Diagnosis not present

## 2022-07-18 DIAGNOSIS — R0789 Other chest pain: Secondary | ICD-10-CM | POA: Diagnosis not present

## 2022-07-18 NOTE — Progress Notes (Signed)
Subjective:     Patient ID: Robin Arellano, female    DOB: 1951-03-15, 71 y.o.   MRN: [id number]  Chief Complaint  Patient presents with   Pain    Pain under right breast that started about 5 days ago, pain with any movement Pt took hydrocodone around 12 pm      HPI-here w/sister.   Pain under R breast for 5 days.  Worse w/any movement.  Moving arm.  More under breast.  No fall, etc.  No new nausea, etc.  No rash. No d/c from nipple. Has mamm sch in 2 days.  Thirsty all the time.   Sees neuro  Oct 27.    Lipitor always caused muscle pain.  Restarted in hospital.  Crestor as well.    There are no preventive care reminders to display for this patient.  Past Medical History:  Diagnosis Date   Allergy    Anemia    Anxiety    on meds   Back pain    Blood transfusion without reported diagnosis    Cataract    Chronic female pelvic pain    Chronic kidney disease    Coronary artery disease    mild, non-obstructive 11/2018   Depression    on meds   Family history of adverse reaction to anesthesia    sister had difficulty waking up   Fibromyalgia    H/O leukocytosis    Headache    Heart murmur    Hyperlipidemia    on meds   Hypertension    on meds   MI (myocardial infarction) ([place])    Pt states she did not have a MI- EKG was normal, was GERD   Osteoarthritis    on meds   Ovarian cyst, right    PONV (postoperative nausea and vomiting)    Post-operative nausea and vomiting    Stroke ([place]) 05/14/2022   L PCA   SVD (spontaneous vaginal delivery)    x 2   Vitamin D deficiency     Past Surgical History:  Procedure Laterality Date   ABDOMINAL HYSTERECTOMY  1994   TAH.BSO   ANTERIOR CERVICAL DECOMP/DISCECTOMY FUSION  2019   APPENDECTOMY  1975   BACK SURGERY  2023   BIOPSY  05/12/2020   Procedure: BIOPSY;  Surgeon: Milus Banister, MD;  [facility];  Service: Endoscopy;;   CARDIAC CATHETERIZATION  2020   CHOLECYSTECTOMY N/A 07/21/2020   Procedure:  LAPAROSCOPIC CHOLECYSTECTOMY WITH INTRAOPERATIVE CHOLANGIOGRAM;  Surgeon: [name], MD;  Location: [facility];  Service: General;  Laterality: N/A;   COLONOSCOPY  08/12/2017   Hx TA (piecemeal)Jacobs-MAC-suprep (good)   ESOPHAGOGASTRODUODENOSCOPY (EGD) WITH PROPOFOL N/A 05/12/2020   Procedure: ESOPHAGOGASTRODUODENOSCOPY (EGD) WITH PROPOFOL;  Surgeon: Milus Banister, MD;  [facility];  Service: Endoscopy;  Laterality: N/A;   EUS N/A 05/12/2020   Procedure: UPPER ENDOSCOPIC ULTRASOUND (EUS) RADIAL;  Surgeon: Milus Banister, MD;  [facility];  Service: Endoscopy;  Laterality: N/A;   KNEE SURGERY Bilateral 1996   x 2 - arthroscopic   LEFT HEART CATH AND CORONARY ANGIOGRAPHY N/A 11/25/2018   Procedure: LEFT HEART CATH AND CORONARY ANGIOGRAPHY;  Surgeon: Troy Sine, MD;  Location: [facility];  Service: Cardiovascular;  Laterality: N/A;   PELVIC LAPAROSCOPY  1989   W LYSIS OF ADHESIONS/L SALPINGONEOSTOMY   TUBAL LIGATION     WISDOM TOOTH EXTRACTION      Outpatient Medications Prior to Visit  Medication Sig Dispense  Refill   albuterol (VENTOLIN HFA) 108 (90 Base) MCG/ACT inhaler Inhale 2 puffs into the lungs every 4 (four) hours as needed for wheezing or shortness of breath. 8 g 0   ALPRAZolam (XANAX) 0.5 MG tablet Take 1 tablet (0.5 mg total) by mouth 2 (two) times daily as needed for sleep or anxiety. 30 tablet 1   amLODipine (NORVASC) 10 MG tablet TAKE 1 TABLET(10 MG) BY MOUTH DAILY (Patient taking differently: Take 10 mg by mouth daily.) 90 tablet 3   Ascorbic Acid (VITAMIN C) 1000 MG tablet Take 1,000 mg by mouth in the morning.     aspirin EC 81 MG tablet Take 81 mg by mouth as needed (chest pain). Swallow whole.     butalbital-acetaminophen-caffeine (FIORICET) 50-325-40 MG tablet Take 1 tablet by mouth every 6 (six) hours as needed for headache or migraine. 14 tablet 0   Calcium Carbonate Antacid (TUMS PO) Take 2 tablets by mouth daily as needed (stomach  pain).     Cholecalciferol (VITAMIN D) 50 MCG (2000 UT) tablet Take 2,000 Units by mouth in the morning.     cloNIDine (CATAPRES) 0.1 MG tablet Take 1 tablet (0.1 mg total) by mouth 2 (two) times daily. 30 tablet 2   ezetimibe (ZETIA) 10 MG tablet TAKE 1 TABLET(10 MG) BY MOUTH DAILY (Patient taking differently: Take 10 mg by mouth daily.) 90 tablet 3   hydrALAZINE (APRESOLINE) 100 MG tablet Take 1 tablet (100 mg total) by mouth 3 (three) times daily. (Patient taking differently: Take 100 mg by mouth in the morning.) 270 tablet 1   HYDROcodone-acetaminophen (NORCO) 10-325 MG tablet Take 1 tablet by mouth 3 (three) times daily as needed.     loperamide (IMODIUM) 2 MG capsule Take 1 capsule (2 mg total) by mouth as needed for diarrhea or loose stools. 30 capsule 0   methocarbamol (ROBAXIN) 500 MG tablet Take 1 tablet (500 mg total) by mouth every 6 (six) hours as needed for muscle spasms. 30 tablet 3   nystatin (MYCOSTATIN) 100000 UNIT/ML suspension Take 5 mLs (500,000 Units total) by mouth 4 (four) times daily. 60 mL 0   ondansetron (ZOFRAN) 4 MG tablet Take 1 tablet (4 mg total) by mouth every 4 (four) hours as needed for nausea or vomiting. 60 tablet 3   pantoprazole (PROTONIX) 40 MG tablet TAKE 1 TABLET(40 MG) BY MOUTH DAILY 90 tablet 3   polyethylene glycol powder (GLYCOLAX/MIRALAX) 17 GM/SCOOP powder Take 17 g by mouth daily as needed. 238 g 0   Potassium Chloride ER 20 MEQ TBCR Take 20 mEq by mouth in the morning and at bedtime. 60 tablet 0   topiramate (TOPAMAX) 25 MG tablet TAKE 1 TABLET(25 MG) BY MOUTH TWICE DAILY 180 tablet 1   UNABLE TO FIND Med Name: Swiss Kriss     valACYclovir (VALTREX) 500 MG tablet TAKE 1 TABLET BY MOUTH TWICE DAILY FOR 3 TO 5 DAYS THEN TAKE DAILY AS NEEDED (Patient taking differently: Take 500 mg by mouth 2 (two) times daily as needed (shingles flair up).) 30 tablet 0   atorvastatin (LIPITOR) 40 MG tablet Take 1 tablet (40 mg total) by mouth daily. 90 tablet 1    OLMESARTAN MEDOXOMIL PO Take 40 mg by mouth daily.     No facility-administered medications prior to visit.    Allergies  Allergen Reactions   Flexeril [Cyclobenzaprine] Anaphylaxis, Hives, Itching and Swelling   Lidocaine Hives   Zanaflex [Tizanidine] Anaphylaxis, Hives, Itching and Swelling   Neurontin [Gabapentin] Itching  and Swelling   Latex Rash   ROS neg/noncontributory except as noted HPI/below      Objective:     BP 120/60   Pulse (!) 52   Temp 98.2 F (36.8 C) (Temporal)   Ht _0  (1.6 m)   Wt 134 lb 4 oz (60.9 kg)   LMP  (LMP Unknown)   SpO2 97%   BMI 23.78 kg/m  Wt Readings from Last 3 Encounters:  07/18/22 134 lb 4 oz (60.9 kg)  07/13/22 134 lb 4 oz (60.9 kg)  07/04/22 130 lb 6 oz (59.1 kg)    Physical Exam   Gen: WDWN NAD HEENT: NCAT, conjunctiva not injected, sclera nonicteric CARDIAC: RRR, S1S2+, no murmur.  LUNGS: CTAB. No wheezes ABDOMEN:  BS+, soft, NTND, No HSM, no masses EXT:  no edema MSK: in w/c.   Whole R ant Chest wall TTP.  Some on L as well.  A little towards the back.  No rash NEURO: A&O x3.  CN II-XII intact.  PSYCH: normal mood. Good eye contact Breasts: breasts appear normal, no suspicious masses, no skin or nipple changes or axillary nodes, symmetric fibrous changes in both upper outer quadrants, right breast normal without mass, skin or nipple changes or axillary nodes, left breast normal without mass, skin or nipple changes or axillary nodes .      Assessment & Plan:   Problem List Items Addressed This Visit   None Visit Diagnoses     Chest wall pain    -  Primary   Breast pain          Chest wall pain-I think more musculoskeletal.  May be from Lipitor.  Hold for know.  Call w/progress R breast pain-prob more musculoskeletal.  She can do the mamm as sch or cancel.  Will see if better w/o lipitor.  May need PSK-9 as intol at least 2 statins and has CVA.   No orders of the defined types were placed in this  encounter.   Wellington Hampshire, MD

## 2022-07-18 NOTE — Patient Instructions (Signed)
Stop atorvastatin as may be causing the pain.  Let me know in 2 wks.  May qualify for [place].   Cancel mammogram for now.

## 2022-07-19 ENCOUNTER — Other Ambulatory Visit: Payer: PPO

## 2022-07-19 DIAGNOSIS — D649 Anemia, unspecified: Secondary | ICD-10-CM

## 2022-07-19 DIAGNOSIS — E876 Hypokalemia: Secondary | ICD-10-CM

## 2022-07-20 ENCOUNTER — Ambulatory Visit: Payer: PPO

## 2022-07-23 ENCOUNTER — Telehealth: Payer: Self-pay | Admitting: Family Medicine

## 2022-07-23 NOTE — Telephone Encounter (Signed)
Please see message below

## 2022-07-23 NOTE — Telephone Encounter (Signed)
Patient's daughter [name] requests RX for topiramate (TOPAMAX) 25 MG tablet dosage be reduced to taking 1 x per day and eventually Patient being weaned off of the medication completely due to side effects that have started since Patient began taking the above medication (severe weakness in arms and legs,  frequent urination, mental changes).  [name] further states she feels Patient should have never been prescribed the above named medication which was prescribed by [facility].  Patient is scheduled for appointment 07/24/22 at 1:15 pm regarding the above message.

## 2022-07-24 ENCOUNTER — Telehealth: Payer: Self-pay | Admitting: Family Medicine

## 2022-07-24 ENCOUNTER — Ambulatory Visit (INDEPENDENT_AMBULATORY_CARE_PROVIDER_SITE_OTHER): Payer: PPO | Admitting: Family Medicine

## 2022-07-24 ENCOUNTER — Other Ambulatory Visit (INDEPENDENT_AMBULATORY_CARE_PROVIDER_SITE_OTHER): Payer: PPO

## 2022-07-24 ENCOUNTER — Encounter: Payer: Self-pay | Admitting: Family Medicine

## 2022-07-24 VITALS — BP 122/60 | HR 50 | Temp 98.4°F | Ht 62.0 in

## 2022-07-24 DIAGNOSIS — E876 Hypokalemia: Secondary | ICD-10-CM

## 2022-07-24 DIAGNOSIS — R300 Dysuria: Secondary | ICD-10-CM

## 2022-07-24 DIAGNOSIS — N644 Mastodynia: Secondary | ICD-10-CM

## 2022-07-24 LAB — POCT URINALYSIS DIPSTICK
Bilirubin, UA: POSITIVE
Blood, UA: NEGATIVE
Glucose, UA: NEGATIVE
Ketones, UA: NEGATIVE
Nitrite, UA: POSITIVE
Protein, UA: POSITIVE — AB
Spec Grav, UA: 1.015 (ref 1.010–1.025)
Urobilinogen, UA: 2 E.U./dL — AB
pH, UA: 5 (ref 5.0–8.0)

## 2022-07-24 MED ORDER — CEPHALEXIN 500 MG PO CAPS: 500.0000 mg | ORAL_CAPSULE | Freq: Four times a day (QID) | ORAL | 0 refills | Status: DC

## 2022-07-24 NOTE — Progress Notes (Signed)
Subjective:     Patient ID: Robin Arellano, female    DOB: 1950/12/10, 71 y.o.   MRN: [id number]  Chief Complaint  Patient presents with   Medication Problem    Discuss weaning off of Topamax due to possibly causing a UTI   Dysuria    HPI-here w/sis H/o freq UTI-started yest-dysuria.  Small amts freq.  Some inc back pain.  No f/c. Some suprapubic pain.  Took azo.    Wants to come off topamax.  Was placed when in hosp-causing SE-edema, tingling, back pain.    Myalgias have improved off of atorvastatin.  She is currently on Zetia 10 mg.  We discussed Praluent/Repatha-declines for right now.  Discussed she is high risk (CAD/CVA) she would like to wait and discuss at next visit.  There are no preventive care reminders to display for this patient.  Past Medical History:  Diagnosis Date   Allergy    Anemia    Anxiety    on meds   Back pain    Blood transfusion without reported diagnosis    Cataract    Chronic female pelvic pain    Chronic kidney disease    Coronary artery disease    mild, non-obstructive 11/2018   Depression    on meds   Family history of adverse reaction to anesthesia    sister had difficulty waking up   Fibromyalgia    H/O leukocytosis    Headache    Heart murmur    Hyperlipidemia    on meds   Hypertension    on meds   MI (myocardial infarction) ([place])    Pt states she did not have a MI- EKG was normal, was GERD   Osteoarthritis    on meds   Ovarian cyst, right    PONV (postoperative nausea and vomiting)    Post-operative nausea and vomiting    Stroke ([place]) 05/14/2022   L PCA   SVD (spontaneous vaginal delivery)    x 2   Vitamin D deficiency     Past Surgical History:  Procedure Laterality Date   ABDOMINAL HYSTERECTOMY  1994   TAH.BSO   ANTERIOR CERVICAL DECOMP/DISCECTOMY FUSION  2019   APPENDECTOMY  1975   BACK SURGERY  2023   BIOPSY  05/12/2020   Procedure: BIOPSY;  Surgeon: Milus Banister, MD;  [facility];   Service: Endoscopy;;   CARDIAC CATHETERIZATION  2020   CHOLECYSTECTOMY N/A 07/21/2020   Procedure: LAPAROSCOPIC CHOLECYSTECTOMY WITH INTRAOPERATIVE CHOLANGIOGRAM;  Surgeon: [name], MD;  Location: [facility];  Service: General;  Laterality: N/A;   COLONOSCOPY  08/12/2017   Hx TA (piecemeal)Jacobs-MAC-suprep (good)   ESOPHAGOGASTRODUODENOSCOPY (EGD) WITH PROPOFOL N/A 05/12/2020   Procedure: ESOPHAGOGASTRODUODENOSCOPY (EGD) WITH PROPOFOL;  Surgeon: Milus Banister, MD;  [facility];  Service: Endoscopy;  Laterality: N/A;   EUS N/A 05/12/2020   Procedure: UPPER ENDOSCOPIC ULTRASOUND (EUS) RADIAL;  Surgeon: Milus Banister, MD;  [facility];  Service: Endoscopy;  Laterality: N/A;   KNEE SURGERY Bilateral 1996   x 2 - arthroscopic   LEFT HEART CATH AND CORONARY ANGIOGRAPHY N/A 11/25/2018   Procedure: LEFT HEART CATH AND CORONARY ANGIOGRAPHY;  Surgeon: Troy Sine, MD;  Location: [facility] [facility];  Service: Cardiovascular;  Laterality: N/A;   PELVIC LAPAROSCOPY  1989   W LYSIS OF ADHESIONS/L SALPINGONEOSTOMY   TUBAL LIGATION     WISDOM TOOTH EXTRACTION      Outpatient Medications Prior to Visit  Medication  Sig Dispense Refill   albuterol (VENTOLIN HFA) 108 (90 Base) MCG/ACT inhaler Inhale 2 puffs into the lungs every 4 (four) hours as needed for wheezing or shortness of breath. 8 g 0   ALPRAZolam (XANAX) 0.5 MG tablet Take 1 tablet (0.5 mg total) by mouth 2 (two) times daily as needed for sleep or anxiety. 30 tablet 1   amLODipine (NORVASC) 10 MG tablet TAKE 1 TABLET(10 MG) BY MOUTH DAILY (Patient taking differently: Take 10 mg by mouth daily.) 90 tablet 3   Ascorbic Acid (VITAMIN C) 1000 MG tablet Take 1,000 mg by mouth in the morning.     aspirin EC 81 MG tablet Take 81 mg by mouth as needed (chest pain). Swallow whole.     butalbital-acetaminophen-caffeine (FIORICET) 50-325-40 MG tablet Take 1 tablet by mouth every 6 (six) hours as needed for headache or  migraine. 14 tablet 0   Calcium Carbonate Antacid (TUMS PO) Take 2 tablets by mouth daily as needed (stomach pain).     Cholecalciferol (VITAMIN D) 50 MCG (2000 UT) tablet Take 2,000 Units by mouth in the morning.     cloNIDine (CATAPRES) 0.1 MG tablet Take 1 tablet (0.1 mg total) by mouth 2 (two) times daily. 30 tablet 2   ezetimibe (ZETIA) 10 MG tablet TAKE 1 TABLET(10 MG) BY MOUTH DAILY (Patient taking differently: Take 10 mg by mouth daily.) 90 tablet 3   hydrALAZINE (APRESOLINE) 100 MG tablet Take 1 tablet (100 mg total) by mouth 3 (three) times daily. (Patient taking differently: Take 100 mg by mouth in the morning.) 270 tablet 1   HYDROcodone-acetaminophen (NORCO) 10-325 MG tablet Take 1 tablet by mouth 3 (three) times daily as needed.     loperamide (IMODIUM) 2 MG capsule Take 1 capsule (2 mg total) by mouth as needed for diarrhea or loose stools. 30 capsule 0   methocarbamol (ROBAXIN) 500 MG tablet Take 1 tablet (500 mg total) by mouth every 6 (six) hours as needed for muscle spasms. 30 tablet 3   nystatin (MYCOSTATIN) 100000 UNIT/ML suspension Take 5 mLs (500,000 Units total) by mouth 4 (four) times daily. 60 mL 0   ondansetron (ZOFRAN) 4 MG tablet Take 1 tablet (4 mg total) by mouth every 4 (four) hours as needed for nausea or vomiting. 60 tablet 3   pantoprazole (PROTONIX) 40 MG tablet TAKE 1 TABLET(40 MG) BY MOUTH DAILY 90 tablet 3   polyethylene glycol powder (GLYCOLAX/MIRALAX) 17 GM/SCOOP powder Take 17 g by mouth daily as needed. 238 g 0   Potassium Chloride ER 20 MEQ TBCR Take 20 mEq by mouth in the morning and at bedtime. 60 tablet 0   UNABLE TO FIND Med Name: Swiss Kriss     valACYclovir (VALTREX) 500 MG tablet TAKE 1 TABLET BY MOUTH TWICE DAILY FOR 3 TO 5 DAYS THEN TAKE DAILY AS NEEDED (Patient taking differently: Take 500 mg by mouth 2 (two) times daily as needed (shingles flair up).) 30 tablet 0   topiramate (TOPAMAX) 25 MG tablet TAKE 1 TABLET(25 MG) BY MOUTH TWICE DAILY 180  tablet 1   No facility-administered medications prior to visit.    Allergies  Allergen Reactions   Flexeril [Cyclobenzaprine] Anaphylaxis, Hives, Itching and Swelling   Lidocaine Hives   Zanaflex [Tizanidine] Anaphylaxis, Hives, Itching and Swelling   Neurontin [Gabapentin] Itching and Swelling   Latex Rash   ROS neg/noncontributory except as noted HPI/below  Still complaining of breast pain but not as bad as it was.  She does  have very dense breasts.  She is concerned that they feel a bit lumpy.      Objective:     BP 122/60   Pulse (!) 50   Temp 98.4 F (36.9 C) (Temporal)   Ht _0  (1.575 m)   LMP  (LMP Unknown)   SpO2 98%   BMI 24.55 kg/m  Wt Readings from Last 3 Encounters:  07/18/22 134 lb 4 oz (60.9 kg)  07/13/22 134 lb 4 oz (60.9 kg)  07/04/22 130 lb 6 oz (59.1 kg)    Physical Exam   Gen: WDWN NAD HEENT: NCAT, conjunctiva not injected, sclera nonicteric ABDOMEN:  BS+, soft, NTND, No HSM, no masses EXT:  no edema MSK: no gross abnormalities.  NEURO: A&O x3.  CN II-XII intact.  PSYCH: normal mood. Good eye contact Breast exam-sister present in the room.  Patient has dense breasts and they are fibrocystic, however I do not feel any specific lumps.  She is very tender to palpation (which per her and sister is nothing new)  Results for orders placed or performed in visit on 07/24/22  POCT urinalysis dipstick  Result Value Ref Range   Color, UA ORANGE    Clarity, UA CLEAR    Glucose, UA Negative Negative   Bilirubin, UA POSITIVE    Ketones, UA NEGATIVE    Spec Grav, UA 1.015 1.010 - 1.025   Blood, UA NEGATIVE    pH, UA 5.0 5.0 - 8.0   Protein, UA Positive (A) Negative   Urobilinogen, UA 2.0 (A) 0.2 or 1.0 E.U./dL   Nitrite, UA POSITIVE    Leukocytes, UA Large (3+) (A) Negative   Appearance     Odor          Assessment & Plan:   Problem List Items Addressed This Visit       Other   Hypokalemia   Relevant Orders   Basic Metabolic Panel  (BMET)   CBC with Differential/Platelet   Other Visit Diagnoses     Dysuria    -  Primary   Relevant Orders   POCT urinalysis dipstick (Completed)   Urine Culture   Breast pain         1.  Dysuria-urinalysis-color interference from Azo.  Check culture.  Start Keflex 500 mg twice daily x7 days. 2.  Hypokalemia/anemia-repeat labs today CBC/BMP 3.  Breast tenderness-some improved since off atorvastatin.  Some is chronic.  Patient feels lumps.  She does have fibrocystic breasts and dense breasts.  Advised to reschedule mammogram  Meds ordered this encounter  Medications   cephALEXin (KEFLEX) 500 MG capsule    Sig: Take 1 capsule (500 mg total) by mouth 4 (four) times daily.    Dispense:  14 capsule    Refill:  0    Wellington Hampshire, MD

## 2022-07-24 NOTE — Telephone Encounter (Signed)
Form placed on provider's desk for signature.

## 2022-07-24 NOTE — Patient Instructions (Addendum)
Topamax just once/day for 3 days, then 1/2 dose daily for 3 days then stop.  [facility] Address: [place] [address] Phone: [phone]  Cephalexin for uti

## 2022-07-24 NOTE — Telephone Encounter (Signed)
..  Type of form received: palcard  Additional comments:   Received by: patient  Form should be Faxed to: na   Form should be mailed to:   na   Is patient requesting call for pickup: yes    Form placed:   dr Gertie Fey folder  Attach charge sheet.  Yes   Individual made aware of 3-5 business day turn around (Y/N)?   Yes

## 2022-07-25 ENCOUNTER — Ambulatory Visit: Payer: PPO

## 2022-07-25 ENCOUNTER — Telehealth (HOSPITAL_COMMUNITY): Payer: Self-pay | Admitting: Vascular Surgery

## 2022-07-25 LAB — CBC WITH DIFFERENTIAL/PLATELET
Basophils Absolute: 0.1 10*3/uL (ref 0.0–0.1)
Basophils Relative: 0.6 % (ref 0.0–3.0)
Eosinophils Absolute: 0.4 10*3/uL (ref 0.0–0.7)
Eosinophils Relative: 3.3 % (ref 0.0–5.0)
HCT: 27.2 % — ABNORMAL LOW (ref 36.0–46.0)
Hemoglobin: 8.9 g/dL — ABNORMAL LOW (ref 12.0–15.0)
Lymphocytes Relative: 11.4 % — ABNORMAL LOW (ref 12.0–46.0)
Lymphs Abs: 1.4 10*3/uL (ref 0.7–4.0)
MCHC: 32.6 g/dL (ref 30.0–36.0)
MCV: 97.6 fl (ref 78.0–100.0)
Monocytes Absolute: 0.7 10*3/uL (ref 0.1–1.0)
Monocytes Relative: 5.4 % (ref 3.0–12.0)
Neutro Abs: 10 10*3/uL — ABNORMAL HIGH (ref 1.4–7.7)
Neutrophils Relative %: 79.3 % — ABNORMAL HIGH (ref 43.0–77.0)
Platelets: 307 10*3/uL (ref 150.0–400.0)
RBC: 2.79 Mil/uL — ABNORMAL LOW (ref 3.87–5.11)
RDW: 15.6 % — ABNORMAL HIGH (ref 11.5–15.5)
WBC: 12.6 10*3/uL — ABNORMAL HIGH (ref 4.0–10.5)

## 2022-07-25 LAB — BASIC METABOLIC PANEL
BUN: 18 mg/dL (ref 6–23)
CO2: 20 mEq/L (ref 19–32)
Calcium: 9.6 mg/dL (ref 8.4–10.5)
Chloride: 108 mEq/L (ref 96–112)
Creatinine, Ser: 1.5 mg/dL — ABNORMAL HIGH (ref 0.40–1.20)
GFR: 34.81 mL/min — ABNORMAL LOW (ref >60.00)
Glucose, Bld: 91 mg/dL (ref 70–99)
Potassium: 4.6 mEq/L (ref 3.5–5.1)
Sodium: 139 mEq/L (ref 135–145)

## 2022-07-25 NOTE — Telephone Encounter (Signed)
Patient's husband Timmothy Sours picked up forms 07/25/22 at 3:06 pm

## 2022-07-25 NOTE — Telephone Encounter (Signed)
Caller states: -pt saw Dr. Esther Hardy on 09/12   Caller requests: -AVS printed and placed for pick up with handicap placard form.  AVS printed and placed with placard for pick up.

## 2022-07-25 NOTE — Telephone Encounter (Signed)
Pt is not appropriate for [facility] PT is beimg followed by Dr. Georgina Peer

## 2022-07-26 ENCOUNTER — Other Ambulatory Visit: Payer: Self-pay | Admitting: *Deleted

## 2022-07-26 DIAGNOSIS — D649 Anemia, unspecified: Secondary | ICD-10-CM

## 2022-07-26 NOTE — Progress Notes (Signed)
Potassium is good.  Same dose Wbc high-glad on antibiotics but culture sensitivity still pending.  How is she feeling? Hemoglobin dropping-any bleeding/dark stools?   Refer GI

## 2022-07-27 ENCOUNTER — Telehealth: Payer: Self-pay | Admitting: Cardiovascular Disease

## 2022-07-27 ENCOUNTER — Telehealth: Payer: Self-pay | Admitting: Family Medicine

## 2022-07-27 LAB — URINE CULTURE
MICRO NUMBER:: 13905613
SPECIMEN QUALITY:: ADEQUATE

## 2022-07-27 NOTE — Telephone Encounter (Signed)
Patient would like to switch from Dr. Claiborne Billings to Dr. Johney Frame. Please verify approval.

## 2022-07-27 NOTE — Telephone Encounter (Signed)
Cherise with [facility] states that Patient is refusing PT. Patient will be put on hold because of UTI, low red blood cells, high white blood cells.  Cherise would like to know if Dr. Cherlynn Kaiser would recommend Home Health Nurse to evaluate Patient.  Cherise ph# [phone]

## 2022-07-27 NOTE — Telephone Encounter (Signed)
Pt's sister states pt has a GI provider at [place] but he is out on medical leave. Right now pt can not be seen until November by another provider. She is very concerned about the internal bleeding and thinks this order should be a STAT. She is asking who else the pt should see, like a PA or another office. Please advise

## 2022-07-30 ENCOUNTER — Other Ambulatory Visit (HOSPITAL_COMMUNITY): Payer: Self-pay

## 2022-07-31 ENCOUNTER — Ambulatory Visit: Payer: PPO | Admitting: Physician Assistant

## 2022-07-31 ENCOUNTER — Other Ambulatory Visit: Payer: Self-pay | Admitting: Family Medicine

## 2022-08-01 ENCOUNTER — Telehealth: Payer: Self-pay | Admitting: Family Medicine

## 2022-08-01 ENCOUNTER — Telehealth: Payer: Self-pay | Admitting: Gastroenterology

## 2022-08-01 DIAGNOSIS — D649 Anemia, unspecified: Secondary | ICD-10-CM

## 2022-08-01 NOTE — Addendum Note (Signed)
Addended by: Rosanne Sack R on: 08/01/2022 03:45 PM   Modules accepted: Orders

## 2022-08-01 NOTE — Telephone Encounter (Signed)
Robin Arellano pt that was recently in the hospital for over 30 days. Her PCP drew labs and her Hgb is trending down. She did receive 1 unit of blood inpt.  8/23 Hgb 9.9 9/1   Hgb 9.4 9/12 Hgb 8.9  Pts sister is calling and states that her PCP wanted her to contact GI to be seen. There are no soon appts. As DOD please advise.

## 2022-08-01 NOTE — Telephone Encounter (Signed)
Chart reviewed.  Patient was initially hospitalized with left PCA stroke, discharged to inpatient rehab on 7/11, then re-hospitalized 05/26/2022 - 06/15/2022 with COVID, metabolic encephalopathy, UTI.  During hospitalization, hemoglobin nadir of 6.9, treated with 1 unit PRBCs.  Hemoglobin 9.9 at time of discharge, stable on follow-up 8/23 (9.9) then slow downtrend to 9.4 on 9/1 and 8.9 on 9/12.  BMP at baseline, to include normal BUN and creatinine at baseline (1.5).  Inpatient labs with folate 6.2, normal B12, iron panel.  Baseline Hgb appears to be 10-11 prior to recent hospitalizations.  Patient was last seen in the GI clinic on 08/22/2021 for history of colon polyps and mild acute gallstone pancreatitis in May/2021.  Best I can tell, no known history of GI bleed.  Based on most recent notes, taking ASA 81 mg but no other antiplatelets or anticoagulation.  Please inquire if this patient has been having overt bleeding such as melena, hematochezia.  Inquire about chest pain, shortness of breath, or other symptoms related to anemia.  Recommend starting folic acid 1 mg/day.  Check ferritin, iron panel, repeat folate.

## 2022-08-01 NOTE — Telephone Encounter (Signed)
Patients sister called sates patient hemoglobin is really low would you be able to advise.

## 2022-08-01 NOTE — Telephone Encounter (Signed)
Noted  

## 2022-08-01 NOTE — Telephone Encounter (Signed)
Sister states pt reports no bleeding at all. She has had some chest pain but relates that to being in a stressful envt. She does not have SOB. Orders in for labs and they will come for labs on Friday. She is aware of recommendation to start folic acid '1mg'$  daily.

## 2022-08-01 NOTE — Telephone Encounter (Signed)
Thank you for the update. Can also send repeat CBC w/o diff with that lab order. Thanks

## 2022-08-01 NOTE — Telephone Encounter (Signed)
Cbc order added.

## 2022-08-01 NOTE — Telephone Encounter (Signed)
Spoke with Bethena Roys and she stated that she would call [facility] today and see if she can get her scheduled because they have message on the referral saying they tried calling on 07/26/2022 to schedule, lvm. Bethena Roys will call back if that office cannot get her in and request a referral to another office.

## 2022-08-01 NOTE — Telephone Encounter (Signed)
FYI  Caller states: - Upon calling Dr. Ardis Hughs office she found out he is on medical leave - She was informed by his nurse that she will call Bethena Roys back with someone in office who can see patient ASAP - She just wanted to let Dominica know

## 2022-08-02 ENCOUNTER — Telehealth: Payer: Self-pay | Admitting: Family Medicine

## 2022-08-02 NOTE — Telephone Encounter (Signed)
Caller states: -Patient has completed topamax weaning regimen per PCP and is currently on day 5 of no topamax  - Patient has been having withdrawal symptoms such as leg cramping, migraines, and skin crawling sensation   Caller requests: -PCP recommendations on next steps   Patient does have a follow up visit scheduled for 09/29. Please Advise.

## 2022-08-03 ENCOUNTER — Other Ambulatory Visit (INDEPENDENT_AMBULATORY_CARE_PROVIDER_SITE_OTHER): Payer: PPO

## 2022-08-03 DIAGNOSIS — D649 Anemia, unspecified: Secondary | ICD-10-CM | POA: Diagnosis not present

## 2022-08-03 LAB — IBC + FERRITIN
Ferritin: 163.1 ng/mL (ref 10.0–291.0)
Iron: 52 ug/dL (ref 42–145)
Saturation Ratios: 22.4 % (ref 20.0–50.0)
TIBC: 232.4 ug/dL — ABNORMAL LOW (ref 250.0–450.0)
Transferrin: 166 mg/dL — ABNORMAL LOW (ref 212.0–360.0)

## 2022-08-03 LAB — CBC
HCT: 29.1 % — ABNORMAL LOW (ref 36.0–46.0)
Hemoglobin: 9.8 g/dL — ABNORMAL LOW (ref 12.0–15.0)
MCHC: 33.6 g/dL (ref 30.0–36.0)
MCV: 96 fl (ref 78.0–100.0)
Platelets: 392 10*3/uL (ref 150.0–400.0)
RBC: 3.04 Mil/uL — ABNORMAL LOW (ref 3.87–5.11)
RDW: 15.3 % (ref 11.5–15.5)
WBC: 9.1 10*3/uL (ref 4.0–10.5)

## 2022-08-03 LAB — FOLATE: Folate: 9.5 ng/mL (ref >5.9)

## 2022-08-07 ENCOUNTER — Other Ambulatory Visit: Payer: Self-pay | Admitting: Family Medicine

## 2022-08-08 ENCOUNTER — Telehealth: Payer: Self-pay | Admitting: Family Medicine

## 2022-08-08 NOTE — Telephone Encounter (Signed)
   Agency:   [facility]   Requesting OT/ PT/ Skilled nursing/ Social Work/ Speech:   pt  Reason for Request:   Evaluation  Frequency:   1x/week for one week

## 2022-08-08 NOTE — Telephone Encounter (Signed)
Verbal orders given to [facility] for PT as requested.

## 2022-08-09 ENCOUNTER — Ambulatory Visit: Payer: PPO | Admitting: Cardiology

## 2022-08-09 ENCOUNTER — Other Ambulatory Visit: Payer: Self-pay | Admitting: Family Medicine

## 2022-08-09 NOTE — Progress Notes (Unsigned)
Cardiology Office Note:    Date:  08/09/2022   ID:  Robin Arellano, [name] [birth year], MRN [id number]  PCP:  Tawnya Crook, MD   [place] Providers Cardiologist:  Shelva Majestic, MD {   Referring MD: Tawnya Crook, MD    History of Present Illness:    Robin Arellano is a 71 y.o. female with a hx of nonobstructive CAD on cath 2020, HTN on mult meds, HLD (familial) intol Crestor (on Zetia), Cspine surgery 2021, and low back pain who was previously followed by Dr. Claiborne Billings who now presents to clinic.   Per review of the record, the patient underwent coronary CTA in 2019 which showed moderate coronary plaque with moderate tubular atherosclerosis in the proximal RCA and mid RCA in the 50 to 69% range.  Left main coronary artery had mild plaque less than 25%.  Her LAD had mild proximal plaque in the 25 to 49% range, also had a moderate long segment of atherosclerotic plaque in the mid LAD just beyond the second diagonal vessel of 50 to 69%.  She was felt to have possible severe stenosis in the distal LAD with 70 to 99% plaque. She had a small caliber ramus intermediate vessel.  The circumflex had mild to moderate plaque of 25 to 49% proximally, and then 50 to 69% stenosis.  Subsequent FFR analysis not reveal any significant flow-limiting stenosis with the exception of the distal LAD where the FFR was 0.57. Cath 11/2018 with mild disease. TTE 05/16/22 with LVEF 60-65%, normal RV, no significant valve disease.   Was admitted to [facility] in 05/15/22 after a fall with course complicated by hypertensive emergency. CT abd/pelvis showed 2 small penetrating atherosclerotic ulcers in the descending thoracic aorta. MRI brain showed acute/subacute left PCA infarct. She was managed conservatively by vascular surgery. Cardiology was consulted on that admission for management of HTN. She was discharged on amlodipine 37m daily, chlorthalidone 259mdaily, hydralazine 10084mID, clonidine 0.1 TID.  Coreg was stopped due to bradycardia.    Was readmitted 07/15-08/04 for COVID and UTI. Course complicated by metabolic encephalopathym and AKI on CKD. During that admission CTA negative for PE. Was declined for CIR and discharged home as she did not want to go to SNF.   Today, *** Past Medical History:  Diagnosis Date   Allergy    Anemia    Anxiety    on meds   Back pain    Blood transfusion without reported diagnosis    Cataract    Chronic female pelvic pain    Chronic kidney disease    Coronary artery disease    mild, non-obstructive 11/2018   Depression    on meds   Family history of adverse reaction to anesthesia    sister had difficulty waking up   Fibromyalgia    H/O leukocytosis    Headache    Heart murmur    Hyperlipidemia    on meds   Hypertension    on meds   [place]  Pt states she did not have a MI- EKG was normal, was GERD   Osteoarthritis    on meds   Ovarian cyst, right    PONV (postoperative nausea and vomiting)    Post-operative nausea and vomiting    [place]   L PCA   SVD (spontaneous vaginal delivery)    x 2   Vitamin D deficiency     Past Surgical History:  Procedure Laterality  Date   ABDOMINAL HYSTERECTOMY  1994   TAH.BSO   ANTERIOR CERVICAL DECOMP/DISCECTOMY FUSION  2019   APPENDECTOMY  1975   BACK SURGERY  2023   BIOPSY  05/12/2020   Procedure: BIOPSY;  Surgeon: Milus Banister, MD;  [facility]: [facility];  Service: Endoscopy;;   CARDIAC CATHETERIZATION  2020   CHOLECYSTECTOMY N/A 07/21/2020   Procedure: LAPAROSCOPIC CHOLECYSTECTOMY WITH INTRAOPERATIVE CHOLANGIOGRAM;  Surgeon: [name], MD;  Location: [place];  Service: General;  Laterality: N/A;   COLONOSCOPY  08/12/2017   Hx TA (piecemeal)Jacobs-MAC-suprep (good)   ESOPHAGOGASTRODUODENOSCOPY (EGD) WITH PROPOFOL N/A 05/12/2020   Procedure: ESOPHAGOGASTRODUODENOSCOPY (EGD) WITH PROPOFOL;  Surgeon: Milus Banister, MD;  [facility];  Service: Endoscopy;  Laterality: N/A;   EUS N/A 05/12/2020   Procedure: UPPER ENDOSCOPIC ULTRASOUND (EUS) RADIAL;  Surgeon: Milus Banister, MD;  [facility];  Service: Endoscopy;  Laterality: N/A;   KNEE SURGERY Bilateral 1996   x 2 - arthroscopic   LEFT HEART CATH AND CORONARY ANGIOGRAPHY N/A 11/25/2018   Procedure: LEFT HEART CATH AND CORONARY ANGIOGRAPHY;  Surgeon: Troy Sine, MD;  Location: [place] [facility];  Service: Cardiovascular;  Laterality: N/A;   PELVIC LAPAROSCOPY  1989   W LYSIS OF ADHESIONS/L SALPINGONEOSTOMY   TUBAL LIGATION     WISDOM TOOTH EXTRACTION      Current Medications: No outpatient medications have been marked as taking for the 08/15/22 encounter (Appointment) with Freada Bergeron, MD.     Allergies:   Flexeril [cyclobenzaprine], Lidocaine, Zanaflex [tizanidine], Neurontin [gabapentin], and Latex   Social History   Socioeconomic History   Marital status: Married    Spouse name: don   Number of children: 2   Years of education: College   Highest education level: Not on file  Occupational History   Occupation: Cabin crew  Tobacco Use   Smoking status: Former    Packs/day: 0.15    Years: 20.00    Total pack years: 3.00    Types: Cigarettes    Quit date: 11/12/1998    Years since quitting: 23.7   Smokeless tobacco: Never  Vaping Use   Vaping Use: Never used  Substance and Sexual Activity   Alcohol use: Not Currently   Drug use: No   Sexual activity: Not Currently    Birth control/protection: Post-menopausal, Surgical    Comment: HYSTERECTOMY  Other Topics Concern   Not on file  Social History Narrative   Lives at home with her husband Don   Right handed   Caffeine: unsweet tea, 2 glasses daily   Social Determinants of Health   Financial Resource Strain: Not on file  Food Insecurity: Not on file  Transportation Needs: Not on file  Physical Activity: Not on file  Stress: Not on file  Social Connections: Not  on file     Family History: The patient's ***family history includes Aneurysm in her father; Arthritis in her father; COPD in her brother; Cancer in her father and mother; Early death in her mother; Heart attack in her mother; Heart disease in her paternal grandfather; Miscarriages / [place] in her mother; Other in her sister and sister; Skin cancer in her brother and brother; Uterine cancer (age of onset: 56) in her mother. There is no history of Colon cancer, Rectal cancer, Stomach cancer, Stroke, Neuropathy, Colon polyps, or Esophageal cancer.  ROS:   Please see the history of present illness.    *** All other systems reviewed and are negative.  EKGs/Labs/Other Studies Reviewed:    The following studies were reviewed today: Echo 05/14/22     1. Left ventricular ejection fraction, by estimation, is 60 to 65%. The  left ventricle has normal function. The left ventricle has no regional  wall motion abnormalities. There is mild concentric left ventricular  hypertrophy. Left ventricular diastolic  parameters are indeterminate.   2. Right ventricular systolic function is normal. The right ventricular  size is normal. There is normal pulmonary artery systolic pressure.   3. Right atrial size was mildly dilated.   4. The mitral valve is normal in structure. Trivial mitral valve  regurgitation. No evidence of mitral stenosis.   5. The aortic valve is normal in structure. Aortic valve regurgitation is  not visualized. Aortic valve sclerosis is present, with no evidence of  aortic valve stenosis.   6. The inferior vena cava is normal in size with greater than 50%  respiratory variability, suggesting right atrial pressure of 3 mmHg.   Cath 11/2018: Prox RCA lesion is 20% stenosed. Mid RCA lesion is 20% stenosed. Ost 1st Mrg lesion is 25% stenosed. Prox LAD lesion is 25% stenosed. Mid LAD lesion is 20% stenosed. Dist LAD lesion is 20% stenosed. Mid LM lesion is 5% stenosed.   Mild  multi-vessel nonobstructive CAD with narrowings no greater than 25% in the LAD, circumflex marginal, and dominant  RCA.   LVEDP 10 mm Hg.   RECOMMENDATION: Medical therapy.  Aggressive lipid-lowering therapy with target LDL less than 70.  The patient's chest pain has atypical features and is most likely nonischemic.  EKG:  EKG is *** ordered today.  The ekg ordered today demonstrates ***  Recent Labs: 08/28/2021: TSH 1.070 07/04/2022: Magnesium 2.0 07/13/2022: ALT 8 07/24/2022: BUN 18; Creatinine, Ser 1.50; Potassium 4.6; Sodium 139 08/03/2022: Hemoglobin 9.8; Platelets 392.0  Recent Lipid Panel    Component Value Date/Time   CHOL 167 06/03/2022 0434   CHOL 208 (H) 01/20/2021 1230   TRIG 271 (H) 06/03/2022 0434   HDL 38 (L) 06/03/2022 0434   HDL 68 01/20/2021 1230   CHOLHDL 4.4 06/03/2022 0434   VLDL 54 (H) 06/03/2022 0434   LDLCALC 75 06/03/2022 0434   LDLCALC 119 (H) 01/20/2021 1230     Risk Assessment/Calculations:   {Does this patient have ATRIAL FIBRILLATION?:[phone]}  No BP recorded.  {Refresh Note OR Click here to enter BP  :1}***         Physical Exam:    VS:  LMP  (LMP Unknown)     Wt Readings from Last 3 Encounters:  07/18/22 134 lb 4 oz (60.9 kg)  07/13/22 134 lb 4 oz (60.9 kg)  07/04/22 130 lb 6 oz (59.1 kg)     GEN: *** Well nourished, well developed in no acute distress HEENT: Normal NECK: No JVD; No carotid bruits LYMPHATICS: No lymphadenopathy CARDIAC: ***RRR, no murmurs, rubs, gallops RESPIRATORY:  Clear to auscultation without rales, wheezing or rhonchi  ABDOMEN: Soft, non-tender, non-distended MUSCULOSKELETAL:  No edema; No deformity  SKIN: Warm and dry NEUROLOGIC:  Alert and oriented x 3 PSYCHIATRIC:  Normal affect   ASSESSMENT:    No diagnosis found. PLAN:    In order of problems listed above:  #HTN: Well controlled at home running mainly 120-130. Rare episodes of 150s but this is when she is agitated. Will continue current  management.  -Continue amlodipine 32m daily -Continue hydralazine 1010mTID -Continue chlorthalidone 2527maily -Continue clonidine 0.1mg96mD  #PAD with Penetrating Ulcerations in the  Descending Thoracic Aorta: Seen by vascular surgery. Stable on repeat imaging.  -Resume ASA 19m daily -Continue zetia 135mdaily -Start pravastatin 4063maily as did not tolerate the lipitor  #Prior CVA: Recent PCA infarct noted on MRI head in 05/2022. Currently on ASA, plavix and lipitor.  -Continue ASA 6m3mily, plavix 75mg71mly -Continue zetia 10mg 39my, change lipitor to prava 40mg d62m as did not tolerate lipitor  #CAD: Cath 11/2018 with mild nonobstructive disease  -Continue ASA 6mg da41m plavix 75mg dai70mContinue zetia 10mg dail64mtart pravastatin 40mg daily36mdid not tolerate the lipitor  #HLD: -Continue zetia 10mg daily 30mrt pravastatin 40mg daily a27md not tolerate the lipitor     {Are you ordering a CV Procedure (e.g. stress test, cath, DCCV, TEE, etc)?   Press F2        :[id number]}   [id number] Adjustments/Labs and Tests Ordered: Current medicines are reviewed at length with the patient today.  Concerns regarding medicines are outlined above.  No orders of the defined types were placed in this encounter.  No orders of the defined types were placed in this encounter.   There are no Patient Instructions on file for this visit.   Signed, Tequan Redmon E PemFreada Bergeron23 2:10 PM    [place]

## 2022-08-09 NOTE — Telephone Encounter (Signed)
Ok by me

## 2022-08-09 NOTE — Telephone Encounter (Signed)
Left message for patient to return call.

## 2022-08-10 ENCOUNTER — Other Ambulatory Visit (HOSPITAL_BASED_OUTPATIENT_CLINIC_OR_DEPARTMENT_OTHER): Payer: Self-pay | Admitting: Neurological Surgery

## 2022-08-10 ENCOUNTER — Ambulatory Visit (INDEPENDENT_AMBULATORY_CARE_PROVIDER_SITE_OTHER): Payer: PPO | Admitting: Family Medicine

## 2022-08-10 ENCOUNTER — Encounter: Payer: Self-pay | Admitting: Family Medicine

## 2022-08-10 VITALS — BP 160/78 | HR 60 | Temp 98.6°F | Ht 62.0 in | Wt 138.5 lb

## 2022-08-10 DIAGNOSIS — D500 Iron deficiency anemia secondary to blood loss (chronic): Secondary | ICD-10-CM

## 2022-08-10 DIAGNOSIS — I10 Essential (primary) hypertension: Secondary | ICD-10-CM

## 2022-08-10 DIAGNOSIS — I2510 Atherosclerotic heart disease of native coronary artery without angina pectoris: Secondary | ICD-10-CM

## 2022-08-10 DIAGNOSIS — M5416 Radiculopathy, lumbar region: Secondary | ICD-10-CM

## 2022-08-10 NOTE — Patient Instructions (Signed)
It was very nice to see you today!  Call kidney doctor   PLEASE NOTE:  If you had any lab tests please let us know if you have not heard back within a few days. You may see your results on MyChart before we have a chance to review them but we will give you a call once they are reviewed by [place]. If we ordered any referrals today, please let us know if you have not heard from their office within the next week.   Please try these tips to maintain a healthy lifestyle:  Eat most of your calories during the day when you are active. Eliminate processed foods including packaged sweets (pies, cakes, cookies), reduce intake of potatoes, white bread, white pasta, and white rice. Look for whole grain options, oat flour or almond flour.  Each meal should contain half fruits/vegetables, one quarter protein, and one quarter carbs (no bigger than a computer mouse).  Cut down on sweet beverages. This includes juice, soda, and sweet tea. Also watch fruit intake, though this is a healthier sweet option, it still contains natural sugar! Limit to 3 servings daily.  Drink at least 1 glass of water with each meal and aim for at least 8 glasses per day  Exercise at least 150 minutes every week.
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
## 2022-08-15 ENCOUNTER — Ambulatory Visit: Payer: PPO | Attending: Cardiology | Admitting: Cardiology

## 2022-08-15 ENCOUNTER — Encounter: Payer: Self-pay | Admitting: Cardiology

## 2022-08-15 VITALS — BP 139/78 | HR 58 | Ht 63.0 in | Wt 140.8 lb

## 2022-08-15 DIAGNOSIS — I10 Essential (primary) hypertension: Secondary | ICD-10-CM

## 2022-08-15 DIAGNOSIS — I2510 Atherosclerotic heart disease of native coronary artery without angina pectoris: Secondary | ICD-10-CM | POA: Diagnosis not present

## 2022-08-15 DIAGNOSIS — Z79899 Other long term (current) drug therapy: Secondary | ICD-10-CM

## 2022-08-15 DIAGNOSIS — I639 Cerebral infarction, unspecified: Secondary | ICD-10-CM

## 2022-08-15 DIAGNOSIS — E782 Mixed hyperlipidemia: Secondary | ICD-10-CM | POA: Diagnosis not present

## 2022-08-15 DIAGNOSIS — E785 Hyperlipidemia, unspecified: Secondary | ICD-10-CM

## 2022-08-15 MED ORDER — PRAVASTATIN SODIUM 40 MG PO TABS: 40.0000 mg | ORAL_TABLET | Freq: Every evening | ORAL | 2 refills | Status: DC

## 2022-08-15 MED ORDER — ASPIRIN 81 MG PO TBEC: 81.0000 mg | DELAYED_RELEASE_TABLET | Freq: Every day | ORAL | 3 refills | Status: AC

## 2022-08-15 NOTE — Patient Instructions (Signed)
Medication Instructions:   INCREASE YOUR ASPIRIN TO 81 MG BY MOUTH DAILY  START TAKING PRAVASTATIN 40 MG BY MOUTH DAILY  *If you need a refill on your cardiac medications before your next appointment, please call your pharmacy*   Lab Work:  IN 6-8 WEEKS HERE IN THE OFFICE--LIPIDS --PLEASE COME FASTING TO THIS LAB APPOINTMENT  If you have labs (blood work) drawn today and your tests are completely normal, you will receive your results only by: [place] (if you have MyChart) OR A paper copy in the mail If you have any lab test that is abnormal or we need to change your treatment, we will call you to review the results.    Follow-Up:  4 MONTHS WITH DR. Johney Frame IN THE OFFICE   Important Information About Sugar

## 2022-08-15 NOTE — Progress Notes (Signed)
Cardiology Office Note:    Date:  08/15/2022   ID:  Robin Alexyia, Arellano July 11, 1951, MRN [id number]  PCP:  Tawnya Crook, MD   [place] Prov[facility] Cardiologist:  Shelva Majestic, MD {   Referring MD: Tawnya Crook, MD    History of Present Illness:    Robin Arellano is a 71 y.o. female with a hx of nonobstructive CAD on cath 2020, HTN on mult meds, HLD (familial) intol Crestor (on Zetia), Cspine surgery 2021, and low back pain who was previously followed by Dr. Claiborne Billings who now presents to clinic.   Per review of the record, the patient underwent coronary CTA in 2019 which showed moderate coronary plaque with moderate tubular atherosclerosis in the proximal RCA and mid RCA in the 50 to 69% range.  Left main coronary artery had mild plaque less than 25%.  Her LAD had mild proximal plaque in the 25 to 49% range, also had a moderate long segment of atherosclerotic plaque in the mid LAD just beyond the second diagonal vessel of 50 to 69%.  She was felt to have possible severe stenosis in the distal LAD with 70 to 99% plaque. She had a small caliber ramus intermediate vessel.  The circumflex had mild to moderate plaque of 25 to 49% proximally, and then 50 to 69% stenosis.  Subsequent FFR analysis not reveal any significant flow-limiting stenosis with the exception of the distal LAD where the FFR was 0.57. Cath 11/2018 with mild disease. TTE 05/16/22 with LVEF 60-65%, normal RV, no significant valve disease.   Was admitted to [facility] in 05/15/22 after a fall with course complicated by hypertensive emergency. CT abd/pelvis showed 2 small penetrating atherosclerotic ulcers in the descending thoracic aorta. MRI brain showed acute/subacute left PCA infarct. She was managed conservatively by vascular surgery. Cardiology was consulted on that admission for management of HTN. She was discharged on amlodipine 76m daily, chlorthalidone 210mdaily, hydralazine 1008mID, clonidine 0.1 TID.  Coreg was stopped due to bradycardia.    Was readmitted07/15-08/04 for COVID and UTI. Course complicated by metabolic encephalopathym and AKI on CKD. During that admission CTA negative for PE. Was declined for CIR and discharged home as she did not want to go to SNF.   Today, she is accompanied by a family member. She states that she will start rehab as soon as she gets her CT scan on Monday. She is slowly recovering from her recent back injury and is excited to start PT.  Her blood pressure has been good at home averaging 120's to 130's; this morning it was 121/60. She also notices readings in the 150s a couple times a week. This usually occurs in the setting of stress. Today at the clinic her blood pressure is 158/64 bpm but improved to 142/89 with recheck.  Last week she experienced chest pain a couple times. Her pain always occurred in the setting of aggravation due to family stressors. She states that her pain was resolved by taking a baby aspirin.  She was taken off of plavix and has also stopped her aspirin. There has been concern for bleeding due to low hemoglobin, but she denies any melena or BRBPR. She states she has chronic anemia and may be related to her underlying CKD. She is planned to see Nephrology soon.  She denies any palpitations, shortness of breath, or peripheral edema. No lightheadedness, headaches, syncope, orthopnea, or PND.   Past Medical History:  Diagnosis Date   Allergy  Anemia    Anxiety    on meds   Back pain    Blood transfusion without reported diagnosis    Cataract    Chronic female pelvic pain    Chronic kidney disease    Coronary artery disease    mild, non-obstructive 11/2018   Depression    on meds   Family history of adverse reaction to anesthesia    sister had difficulty waking up   Fibromyalgia    H/O leukocytosis    Headache    Heart murmur    Hyperlipidemia    on meds   Hypertension    on meds   MI (myocardial infarction) ([place])     Pt states she did not have a MI- EKG was normal, was GERD   Osteoarthritis    on meds   Ovarian cyst, right    PONV (postoperative nausea and vomiting)    Post-operative nausea and vomiting    Stroke ([place]) 05/14/2022   L PCA   SVD (spontaneous vaginal delivery)    x 2   Vitamin D deficiency     Past Surgical History:  Procedure Laterality Date   ABDOMINAL HYSTERECTOMY  1994   TAH.BSO   ANTERIOR CERVICAL DECOMP/DISCECTOMY FUSION  2019   APPENDECTOMY  1975   BACK SURGERY  2023   BIOPSY  05/12/2020   Procedure: BIOPSY;  Surgeon: Milus Banister, MD;  Location: [facility];  Service: Endoscopy;;   CARDIAC CATHETERIZATION  2020   CHOLECYSTECTOMY N/A 07/21/2020   Procedure: LAPAROSCOPIC CHOLECYSTECTOMY WITH INTRAOPERATIVE CHOLANGIOGRAM;  Surgeon: [name], MD;  Location: [place];  Service: General;  Laterality: N/A;   COLONOSCOPY  08/12/2017   Hx TA (piecemeal)Jacobs-MAC-suprep (good)   ESOPHAGOGASTRODUODENOSCOPY (EGD) WITH PROPOFOL N/A 05/12/2020   Procedure: ESOPHAGOGASTRODUODENOSCOPY (EGD) WITH PROPOFOL;  Surgeon: Milus Banister, MD;  Location: [facility];  Service: Endoscopy;  Laterality: N/A;   EUS N/A 05/12/2020   Procedure: UPPER ENDOSCOPIC ULTRASOUND (EUS) RADIAL;  Surgeon: Milus Banister, MD;  Location: [facility];  Service: Endoscopy;  Laterality: N/A;   KNEE SURGERY Bilateral 1996   x 2 - arthroscopic   LEFT HEART CATH AND CORONARY ANGIOGRAPHY N/A 11/25/2018   Procedure: LEFT HEART CATH AND CORONARY ANGIOGRAPHY;  Surgeon: Troy Sine, MD;  Location: [place] [facility];  Service: Cardiovascular;  Laterality: N/A;   PELVIC LAPAROSCOPY  1989   W LYSIS OF ADHESIONS/L SALPINGONEOSTOMY   TUBAL LIGATION     WISDOM TOOTH EXTRACTION      Current Medications: Current Meds  Medication Sig   albuterol (VENTOLIN HFA) 108 (90 Base) MCG/ACT inhaler Inhale 2 puffs into the lungs every 4 (four) hours as needed for wheezing or shortness of breath.   ALPRAZolam (XANAX)  0.5 MG tablet TAKE 1 TABLET(0.5 MG) BY MOUTH TWICE DAILY AS NEEDED FOR SLEEP OR ANXIETY   amLODipine (NORVASC) 10 MG tablet TAKE 1 TABLET(10 MG) BY MOUTH DAILY   Ascorbic Acid (VITAMIN C) 1000 MG tablet Take 1,000 mg by mouth in the morning.   aspirin EC 81 MG tablet Take 1 tablet (81 mg total) by mouth daily. Swallow whole.   butalbital-acetaminophen-caffeine (FIORICET) 50-325-40 MG tablet Take 1 tablet by mouth every 6 (six) hours as needed for headache or migraine.   Calcium Carbonate Antacid (TUMS PO) Take 2 tablets by mouth daily as needed (stomach pain).   cephALEXin (KEFLEX) 500 MG capsule Take 1 capsule (500 mg total) by mouth 4 (four) times daily.   Cholecalciferol (VITAMIN D)  50 MCG (2000 UT) tablet Take 2,000 Units by mouth in the morning.   cloNIDine (CATAPRES) 0.1 MG tablet Take 1 tablet (0.1 mg total) by mouth 2 (two) times daily.   ezetimibe (ZETIA) 10 MG tablet TAKE 1 TABLET(10 MG) BY MOUTH DAILY   hydrALAZINE (APRESOLINE) 100 MG tablet Take 1 tablet (100 mg total) by mouth 3 (three) times daily.   HYDROcodone-acetaminophen (NORCO/VICODIN) 5-325 MG tablet take 1-2 tablets every 8 hours as needed for pain   loperamide (IMODIUM) 2 MG capsule Take 1 capsule (2 mg total) by mouth as needed for diarrhea or loose stools.   methocarbamol (ROBAXIN) 500 MG tablet Take 1 tablet (500 mg total) by mouth every 6 (six) hours as needed for muscle spasms.   nystatin (MYCOSTATIN) 100000 UNIT/ML suspension Take 5 mLs (500,000 Units total) by mouth 4 (four) times daily.   ondansetron (ZOFRAN) 4 MG tablet Take 1 tablet (4 mg total) by mouth every 4 (four) hours as needed for nausea or vomiting.   pantoprazole (PROTONIX) 40 MG tablet TAKE 1 TABLET(40 MG) BY MOUTH DAILY   polyethylene glycol powder (GLYCOLAX/MIRALAX) 17 GM/SCOOP powder Take 17 g by mouth daily as needed.   Potassium Chloride ER 20 MEQ TBCR TAKE 1 TABLET BY MOUTH IN THE MORNING AND AT BEDTIME   pravastatin (PRAVACHOL) 40 MG tablet Take 1  tablet (40 mg total) by mouth every evening.   UNABLE TO FIND Med Name: Swiss Kriss   valACYclovir (VALTREX) 500 MG tablet TAKE 1 TABLET BY MOUTH TWICE DAILY FOR 3 TO 5 DAYS THEN TAKE DAILY AS NEEDED   [DISCONTINUED] aspirin EC 81 MG tablet Take 81 mg by mouth as needed (chest pain). Swallow whole.     Allergies:   Flexeril [cyclobenzaprine], Lidocaine, Zanaflex [tizanidine], Neurontin [gabapentin], and Latex   Social History   Socioeconomic History   Marital status: Married    Spouse name: don   Number of children: 2   Years of education: College   Highest education level: Not on file  Occupational History   Occupation: Realtor  Tobacco Use   Smoking status: Former    Packs/day: 0.15    Years: 20.00    Total pack years: 3.00    Types: Cigarettes    Quit date: 11/12/1998    Years since quitting: 23.7   Smokeless tobacco: Never  Vaping Use   Vaping Use: Never used  Substance and Sexual Activity   Alcohol use: Not Currently   Drug use: No   Sexual activity: Not Currently    Birth control/protection: Post-menopausal, Surgical    Comment: HYSTERECTOMY  Other Topics Concern   Not on file  Social History Narrative   Lives at home with her husband Don   Right handed   Caffeine: unsweet tea, 2 glasses daily   Social Determinants of Health   Financial Resource Strain: Not on file  Food Insecurity: Not on file  Transportation Needs: Not on file  Physical Activity: Not on file  Stress: Not on file  Social Connections: Not on file     Family History: The patient's family history includes Aneurysm in her father; Arthritis in her father; COPD in her brother; Cancer in her father and mother; Early death in her mother; Heart attack in her mother; Heart disease in her paternal grandfather; Miscarriages / [place] in her mother; Other in her sister and sister; Skin cancer in her brother and brother; Uterine cancer (age of onset: 59) in her mother. There is no history of  Colon  cancer, Rectal cancer, Stomach cancer, Stroke, Neuropathy, Colon polyps, or Esophageal cancer.  ROS:   Review of Systems  Constitutional:  Positive for malaise/fatigue. Negative for chills and fever.  HENT:  Negative for ear pain and hearing loss.   Eyes:  Negative for blurred vision and pain.  Respiratory:  Negative for cough and shortness of breath.   Cardiovascular:  Positive for chest pain. Negative for palpitations, orthopnea, claudication, leg swelling and PND.  Gastrointestinal:  Negative for nausea and vomiting.  Genitourinary:  Negative for dysuria and hematuria.  Musculoskeletal:  Negative for joint pain and myalgias.  Neurological:  Negative for dizziness and headaches.  Psychiatric/Behavioral:  Negative for depression and substance abuse.      EKGs/Labs/Other Studies Reviewed:    The following studies were reviewed today:  LE Bilateral Venous Doppler 06/13/2022:  Summary:  BILATERAL:  - No evidence of deep vein thrombosis seen in the lower extremities,  bilaterally.  -No evidence of popliteal cyst, bilaterally.   Echo 05/14/22    1. Left ventricular ejection fraction, by estimation, is 60 to 65%. The  left ventricle has normal function. The left ventricle has no regional  wall motion abnormalities. There is mild concentric left ventricular  hypertrophy. Left ventricular diastolic  parameters are indeterminate.   2. Right ventricular systolic function is normal. The right ventricular  size is normal. There is normal pulmonary artery systolic pressure.   3. Right atrial size was mildly dilated.   4. The mitral valve is normal in structure. Trivial mitral valve  regurgitation. No evidence of mitral stenosis.   5. The aortic valve is normal in structure. Aortic valve regurgitation is  not visualized. Aortic valve sclerosis is present, with no evidence of  aortic valve stenosis.   6. The inferior vena cava is normal in size with greater than 50%  respiratory  variability, suggesting right atrial pressure of 3 mmHg.   Cath 11/2018: Prox RCA lesion is 20% stenosed. Mid RCA lesion is 20% stenosed. Ost 1st Mrg lesion is 25% stenosed. Prox LAD lesion is 25% stenosed. Mid LAD lesion is 20% stenosed. Dist LAD lesion is 20% stenosed. Mid LM lesion is 5% stenosed.   Mild multi-vessel nonobstructive CAD with narrowings no greater than 25% in the LAD, circumflex marginal, and dominant  RCA.   LVEDP 10 mm Hg.   RECOMMENDATION: Medical therapy.  Aggressive lipid-lowering therapy with target LDL less than 70.  The patient's chest pain has atypical features and is most likely nonischemic.  EKG:  EKG is personally reviewed. 08/15/2022: EKG was not ordered. 05/26/2022 (ED): sinus rhythm  Recent Labs: 08/28/2021: TSH 1.070 07/04/2022: Magnesium 2.0 07/13/2022: ALT 8 07/24/2022: BUN 18; Creatinine, Ser 1.50; Potassium 4.6; Sodium 139 08/03/2022: Hemoglobin 9.8; Platelets 392.0  Recent Lipid Panel    Component Value Date/Time   CHOL 167 06/03/2022 0434   CHOL 208 (H) 01/20/2021 1230   TRIG 271 (H) 06/03/2022 0434   HDL 38 (L) 06/03/2022 0434   HDL 68 01/20/2021 1230   CHOLHDL 4.4 06/03/2022 0434   VLDL 54 (H) 06/03/2022 0434   LDLCALC 75 06/03/2022 0434   LDLCALC 119 (H) 01/20/2021 1230     Risk Assessment/Calculations:                Physical Exam:    VS:  BP 139/78 (BP Location: Right Arm, Patient Position: Sitting, Cuff Size: Normal)   Pulse (!) 58   Ht _0  (1.6 m)   Wt 140 lb 12.8  oz (63.9 kg)   LMP  (LMP Unknown)   SpO2 98%   BMI 24.94 kg/m     Wt Readings from Last 3 Encounters:  08/15/22 140 lb 12.8 oz (63.9 kg)  08/10/22 138 lb 8 oz (62.8 kg)  07/18/22 134 lb 4 oz (60.9 kg)     GEN: Well nourished, well developed in no acute distress HEENT: Normal NECK: No JVD; No carotid bruits CARDIAC: RRR, 1/6 systolic murmur. No rubs, gallops RESPIRATORY:  Clear to auscultation without rales, wheezing or rhonchi  ABDOMEN: Soft,  non-tender, non-distended MUSCULOSKELETAL:  No edema; No deformity  SKIN: Warm and dry NEUROLOGIC:  Alert and oriented x 3 PSYCHIATRIC:  Normal affect   ASSESSMENT:    1. Essential hypertension   2. Medication management   3. Coronary artery disease involving native coronary artery of native heart without angina pectoris   4. Mixed hyperlipidemia   5. Hyperlipidemia with target LDL less than 70   6. Primary hypertension   7. Cerebrovascular accident (CVA), unspecified mechanism ([place])    PLAN:    In order of problems listed above:  #HTN: Well controlled at home running mainly 120-130. Rare episodes of 150s but this is when she is agitated. Will continue current management.  -Continue amlodipine 45m daily -Continue hydralazine 1079mTID -Continue chlorthalidone 2560maily -Continue clonidine 0.1mg34mD  #PAD with Penetrating Ulcerations in the Descending Thoracic Aorta: Seen by vascular surgery. Stable on repeat imaging.  -Resume ASA 81mg60mly -Continue zetia 10mg 32my -Start pravastatin 40mg d13m as did not tolerate the lipitor  #Prior CVA: Recent PCA infarct noted on MRI head in 05/2022. Has stopped her plavix, aspirin and statin. Given that her hemoglobin is stable without symptoms of bleeding, will resume her aspirin at this time. Will change her lipitor to pravastatin due to muscle aches.  -Resume ASA 81mg da92m-Continue zetia 10mg dai27mchange lipitor to prava 40mg dail64m did not tolerate lipitor  #CAD: Cath 11/2018 with mild nonobstructive disease  -Resume ASA 81mg daily66mntinue zetia 10mg daily 22mrt pravastatin 40mg daily a46md not tolerate the lipitor  #HLD: -Continue zetia 10mg daily -S39m pravastatin 40mg daily as 42mnot tolerate the lipitor         Follow Up: 4 months  Medication Adjustments/Labs and Tests Ordered: Current medicines are reviewed at length with the patient today.  Concerns regarding medicines are outlined above.   Orders  Placed This Encounter  Procedures   Lipid Profile   Meds ordered this encounter  Medications   aspirin EC 81 MG tablet    Sig: Take 1 tablet (81 mg total) by mouth daily. Swallow whole.    Dispense:  90 tablet    Refill:  3   pravastatin (PRAVACHOL) 40 MG tablet    Sig: Take 1 tablet (40 mg total) by mouth every evening.    Dispense:  90 tablet    Refill:  2   Patient Instructions  Medication Instructions:   INCREASE YOUR ASPIRIN TO 81 MG BY MOUTH DAILY  START TAKING PRAVASTATIN 40 MG BY MOUTH DAILY  *If you need a refill on your cardiac medications before your next appointment, please call your pharmacy*   Lab Work:  IN 6-8 WEEKS HERE IN THE OFFICE--LIPIDS --PLEASE COME FASTING TO THIS LAB APPOINTMENT  If you have labs (blood work) drawn today and your tests are completely normal, you will receive your results only by: MyChart Message[place]Chart) OR A paper copy in  the mail If you have any lab test that is abnormal or we need to change your treatment, we will call you to review the results.    Follow-Up:  4 MONTHS WITH DR. Johney Frame IN THE OFFICE   Important Information About Sugar          I,Mathew Stumpf,acting as a scribe for Freada Bergeron, MD.,have documented all relevant documentation on the behalf of Freada Bergeron, MD,as directed by  Freada Bergeron, MD while in the presence of Freada Bergeron, MD.  I, Freada Bergeron, MD, have reviewed all documentation for this visit. The documentation on 08/15/22 for the exam, diagnosis, procedures, and orders are all accurate and complete.   Signed, Freada Bergeron, MD  08/15/2022 8:16 PM    [place]

## 2022-08-16 ENCOUNTER — Telehealth: Payer: Self-pay | Admitting: Oncology

## 2022-08-16 ENCOUNTER — Other Ambulatory Visit: Payer: Self-pay | Admitting: Family Medicine

## 2022-08-16 ENCOUNTER — Other Ambulatory Visit: Payer: Self-pay | Admitting: Cardiovascular Disease

## 2022-08-16 NOTE — Telephone Encounter (Signed)
Attempted to contact patient in regards to referral, no answer so voicemail was left for patient to call back  

## 2022-08-16 NOTE — Telephone Encounter (Signed)
Did she pick up the prescription sent in September 1 that Dr. Cherlynn Kaiser sent in?-it is not listed on PDMP/state database

## 2022-08-20 ENCOUNTER — Ambulatory Visit (HOSPITAL_BASED_OUTPATIENT_CLINIC_OR_DEPARTMENT_OTHER)
Admission: RE | Admit: 2022-08-20 | Discharge: 2022-08-20 | Disposition: A | Discharge status: Home / Self Care | Payer: PPO | Source: Ambulatory Visit | Attending: Neurological Surgery | Admitting: Neurological Surgery

## 2022-08-20 DIAGNOSIS — M5416 Radiculopathy, lumbar region: Secondary | ICD-10-CM | POA: Insufficient documentation

## 2022-08-22 ENCOUNTER — Telehealth: Payer: Self-pay | Admitting: Vascular Surgery

## 2022-08-22 ENCOUNTER — Other Ambulatory Visit (INDEPENDENT_AMBULATORY_CARE_PROVIDER_SITE_OTHER): Payer: PPO

## 2022-08-22 DIAGNOSIS — D500 Iron deficiency anemia secondary to blood loss (chronic): Secondary | ICD-10-CM

## 2022-08-22 LAB — FECAL OCCULT BLOOD, IMMUNOCHEMICAL: Fecal Occult Bld: NEGATIVE

## 2022-08-22 NOTE — Telephone Encounter (Signed)
-----   Message from Cherre Robins, MD sent at 08/16/2022 11:44 AM EDT ----- Please work into clinic in next 3 months with CT angiogram of chest. Thanks. Tom ----- Message ----- From: Freada Bergeron, MD Sent: 08/16/2022   9:36 AM EDT To: Cherre Robins, MD  This is the lady with penetrating atherosclerotic ulcers in the descending aorta who you saw in the hospital. Did she need vascular follow-up as outpatient? She was asking and I didn't know if this is where we just work on cholesterol and BP lowering or if you guys wanted to monitor with serial imaging. Just don't want to send you something you do not need to see.  Thank you and hope you have a glorious day.  Nira Conn

## 2022-08-23 ENCOUNTER — Other Ambulatory Visit: Payer: Self-pay

## 2022-08-23 DIAGNOSIS — I7025 Atherosclerosis of native arteries of other extremities with ulceration: Secondary | ICD-10-CM

## 2022-08-23 DIAGNOSIS — I729 Aneurysm of unspecified site: Secondary | ICD-10-CM

## 2022-08-23 NOTE — Addendum Note (Signed)
Addended by: Kaleen Mask on: 08/23/2022 02:13 PM   Modules accepted: Orders

## 2022-08-28 ENCOUNTER — Ambulatory Visit (INDEPENDENT_AMBULATORY_CARE_PROVIDER_SITE_OTHER): Payer: PPO | Admitting: Neurology

## 2022-08-28 VITALS — BP 152/68 | HR 69 | Ht 63.0 in | Wt 140.0 lb

## 2022-08-28 DIAGNOSIS — I639 Cerebral infarction, unspecified: Secondary | ICD-10-CM

## 2022-08-28 DIAGNOSIS — R413 Other amnesia: Secondary | ICD-10-CM | POA: Diagnosis not present

## 2022-08-28 DIAGNOSIS — G3184 Mild cognitive impairment, so stated: Secondary | ICD-10-CM | POA: Diagnosis not present

## 2022-08-28 DIAGNOSIS — H53461 Homonymous bilateral field defects, right side: Secondary | ICD-10-CM | POA: Diagnosis not present

## 2022-08-28 NOTE — Patient Instructions (Signed)
I had a long d/w patient and her sister about her recent cryptogenic stroke, memory loss and right sided visual field loss,risk for recurrent stroke/TIAs, personally independently reviewed imaging studies and stroke evaluation results and answered questions.Continue aspirin 81 mg daily  for secondary stroke prevention and maintain strict control of hypertension with blood pressure goal below 130/90, diabetes with hemoglobin A1c goal below 6.5% and lipids with LDL cholesterol goal below 70 mg/dL. I also advised the patient to eat a healthy diet with plenty of whole grains, cereals, fruits and vegetables, exercise regularly and maintain ideal body weight.  Check memory panel labs EEG.  I advised the patient to participate in cognitively challenging activities like solving crossword puzzles, playing bridge and sudoku.  We also discussed memory, refer to cardiac electrophysiology for loop recorder insertion for paroxysmal A-fib.  Followup in the future with my nurse practitioner months or call earlier if necessary.  Memory Compensation Strategies  Use "WARM" strategy.  W= write it down  A= associate it  R= repeat it  M= make a mental note  2.   You can keep a Social worker.  Use a 3-ring notebook with sections for the following: calendar, important names and phone numbers,  medications, doctors' names/phone numbers, lists/reminders, and a section to journal what you did  each day.   3.    Use a calendar to write appointments down.  4.    Write yourself a schedule for the day.  This can be placed on the calendar or in a separate section of the Memory Notebook.  Keeping a  regular schedule can help memory.  5.    Use medication organizer with sections for each day or morning/evening pills.  You may need help loading it  6.    Keep a basket, or pegboard by the door.  Place items that you need to take out with you in the basket or on the pegboard.  You may also want to  include a message board for  reminders.  7.    Use sticky notes.  Place sticky notes with reminders in a place where the task is performed.  For example: " turn off the  stove" placed by the stove, "lock the door" placed on the door at eye level, " take your medications" on  the bathroom mirror or by the place where you normally take your medications.  8.    Use alarms/timers.  Use while cooking to remind yourself to check on food or as a reminder to take your medicine, or as a  reminder to make a call, or as a reminder to perform another task, etc.

## 2022-08-28 NOTE — Progress Notes (Signed)
[facility] [address] [place]. [place] [address] [phone]       OFFICE FOLLOW-UP NOTE  [name] Date of Birth:  [birth year] Medical Record Number:  [id number]   HPI: Patient is a 71 year old pleasant Caucasian lady seen today for initial office follow-up visit following hospital consultation for stroke in July 2023.  She is accompanied by her sister.  History is obtained from them and review of electronic medical records.  I personally reviewed pertinent available imaging in PACS.Robin Arellano is a 71 y.o. female with past medical history of fibromyalgia, chronic back pain, s/p recent L4-L5 posterior lumbar fusion, CAD, HTN, HLD with statin intolerance, anxiety, depression, CKD and former smoker who presented on  05/14/22 after sustaining a fall in which her left leg gave out causing her to fall backwards. CT head revealed suspicious acute left infarct. MRI brain confirmed acute/subacute left PCA infarct. Neurology consulted for stroke workup .  On inquiry she stated that she had noted some wavy vision out of her right eye as well as vertigo and headaches about 7 weeks prior to her fall.  MRI confirmed subacute left PCA infarct and showed mild atherosclerotic changes with 3 mm supraclinoid ICA aneurysms bilaterally.  2D echo showed ejection fraction 60 to 65%.  LDL cholesterol is 101 mg percent.  Hemoglobin A1c was 5.0.  He was started on aspirin and Plavix for 3 weeks followed by aspirin alone advised to have outpatient 30-day heart monitor which she has not done.  Patient states her right-sided peripheral vision loss persists.  She is also had trouble walking because of deconditioning.  She was in rehab to move back to the hospital as well as rehab when he was infected by COVID illness.  She states she is deconditioning.  Plan to do outpatient physical and occupational therapies.  Has chronic back issues and saw her neurosurgeon Dr. Ellene Route outpatient  therapies.  Improving.  He was seen by cardiologist recently as well.  Patient with short-term memory difficulties since his nonprogressive and secondary proving.  He is tolerating aspirin well without bruising or bleeding.  Her blood pressure is under good she is tolerating Pravachol well without any aches or pains.  ROS:   14 system review of systems is positive for joint difficulties, balance difficulties, decreased memory chronic back pain and all other systems negative  PMH:  Past Medical History:  Diagnosis Date   Allergy    Anemia    Anxiety    on meds   Back pain    Blood transfusion without reported diagnosis    Cataract    Chronic female pelvic pain    Chronic kidney disease    Coronary artery disease    mild, non-obstructive 11/2018   Depression    on meds   Family history of adverse reaction to anesthesia    sister had difficulty waking up   Fibromyalgia    H/O leukocytosis    Headache    Heart murmur    Hyperlipidemia    on meds   Hypertension    on meds   MI (myocardial infarction) ([place])    Pt states she did not have a MI- EKG was normal, was GERD   Osteoarthritis    on meds   Ovarian cyst, right    PONV (postoperative nausea and vomiting)    Post-operative nausea and vomiting    Stroke ([place]) 05/14/2022   L PCA   SVD (spontaneous vaginal delivery)  x 2   Vitamin D deficiency     Social History:  Social History   Socioeconomic History   Marital status: Married    Spouse name: don   Number of children: 2   Years of education: College   Highest education level: Not on file  Occupational History   Occupation: Realtor  Tobacco Use   Smoking status: Former    Packs/day: 0.15    Years: 20.00    Total pack years: 3.00    Types: Cigarettes    Quit date: 11/12/1998    Years since quitting: 23.8   Smokeless tobacco: Never  Vaping Use   Vaping Use: Never used  Substance and Sexual Activity   Alcohol use: Not Currently   Drug use: No   Sexual  activity: Not Currently    Birth control/protection: Post-menopausal, Surgical    Comment: HYSTERECTOMY  Other Topics Concern   Not on file  Social History Narrative   Lives at home with her husband Don   Right handed   Caffeine: unsweet tea, 2 glasses daily   Social Determinants of Health   Financial Resource Strain: Not on file  Food Insecurity: Not on file  Transportation Needs: Not on file  Physical Activity: Not on file  Stress: Not on file  Social Connections: Not on file  Intimate Partner Violence: Not on file    Medications:   Current Outpatient Medications on File Prior to Visit  Medication Sig Dispense Refill   albuterol (VENTOLIN HFA) 108 (90 Base) MCG/ACT inhaler Inhale 2 puffs into the lungs every 4 (four) hours as needed for wheezing or shortness of breath. 8 g 0   ALPRAZolam (XANAX) 0.5 MG tablet TAKE 1 TABLET(0.5 MG) BY MOUTH TWICE DAILY AS NEEDED FOR SLEEP OR ANXIETY 30 tablet 1   amLODipine (NORVASC) 10 MG tablet TAKE 1 TABLET(10 MG) BY MOUTH DAILY 90 tablet 3   Ascorbic Acid (VITAMIN C) 1000 MG tablet Take 1,000 mg by mouth in the morning.     aspirin EC 81 MG tablet Take 1 tablet (81 mg total) by mouth daily. Swallow whole. 90 tablet 3   butalbital-acetaminophen-caffeine (FIORICET) 50-325-40 MG tablet TAKE 1 TABLET BY MOUTH EVERY 6 HOURS AS NEEDED FOR HEADACHE OR MIGRAINE 14 tablet 0   Calcium Carbonate Antacid (TUMS PO) Take 2 tablets by mouth daily as needed (stomach pain).     cephALEXin (KEFLEX) 500 MG capsule Take 1 capsule (500 mg total) by mouth 4 (four) times daily. 14 capsule 0   Cholecalciferol (VITAMIN D) 50 MCG (2000 UT) tablet Take 2,000 Units by mouth in the morning.     cloNIDine (CATAPRES) 0.1 MG tablet TAKE 1 TABLET(0.1 MG) BY MOUTH TWICE DAILY 90 tablet 3   ezetimibe (ZETIA) 10 MG tablet TAKE 1 TABLET(10 MG) BY MOUTH DAILY 90 tablet 3   hydrALAZINE (APRESOLINE) 100 MG tablet Take 1 tablet (100 mg total) by mouth 3 (three) times daily. 270  tablet 1   HYDROcodone-acetaminophen (NORCO/VICODIN) 5-325 MG tablet take 1-2 tablets every 8 hours as needed for pain     methocarbamol (ROBAXIN) 500 MG tablet Take 1 tablet (500 mg total) by mouth every 6 (six) hours as needed for muscle spasms. 30 tablet 3   nystatin (MYCOSTATIN) 100000 UNIT/ML suspension Take 5 mLs (500,000 Units total) by mouth 4 (four) times daily. 60 mL 0   ondansetron (ZOFRAN) 4 MG tablet Take 1 tablet (4 mg total) by mouth every 4 (four) hours as needed for nausea or  vomiting. 60 tablet 3   pantoprazole (PROTONIX) 40 MG tablet TAKE 1 TABLET(40 MG) BY MOUTH DAILY 90 tablet 3   polyethylene glycol powder (GLYCOLAX/MIRALAX) 17 GM/SCOOP powder Take 17 g by mouth daily as needed. 238 g 0   Potassium Chloride ER 20 MEQ TBCR TAKE 1 TABLET BY MOUTH IN THE MORNING AND AT BEDTIME 180 tablet 0   pravastatin (PRAVACHOL) 40 MG tablet Take 1 tablet (40 mg total) by mouth every evening. 90 tablet 2   UNABLE TO FIND Med Name: Swiss Kriss     valACYclovir (VALTREX) 500 MG tablet TAKE 1 TABLET BY MOUTH TWICE DAILY FOR 3 TO 5 DAYS THEN TAKE DAILY AS NEEDED 30 tablet 0   No current facility-administered medications on file prior to visit.    Allergies:   Allergies  Allergen Reactions   Flexeril [Cyclobenzaprine] Anaphylaxis, Hives, Itching and Swelling   Lidocaine Hives   Zanaflex [Tizanidine] Anaphylaxis, Hives, Itching and Swelling   Neurontin [Gabapentin] Itching and Swelling   Latex Rash    Physical Exam General: well developed, well nourished pleasant elderly Caucasian lady, seated, in no evident distress Head: head normocephalic and atraumatic.  Neck: supple with no carotid or supraclavicular bruits Cardiovascular: regular rate and rhythm, no murmurs Musculoskeletal: no deformity Skin:  no rash/petichiae Vascular:  Normal pulses all extremities Vitals:   08/28/22 1037  BP: (!) 152/68  Pulse: 69   Neurologic Exam Mental Status: Awake and fully alert. Oriented to  place and time. Recent and remote memory intact. Attention span, concentration and fund of knowledge appropriate. Mood and affect appropriate.  Diminished recall 0/3.  Able to name only 9 animals which can walk on 4 legs.  Clock drawing 4/4. Cranial Nerves: Fundoscopic exam reveals sharp disc margins. Pupils equal, briskly reactive to light. Extraocular movements full without nystagmus. Visual fields l to confrontation. Hearing intact. Facial sensation intact. Face, tongue, palate moves normally and symmetrically.  Motor: Normal bulk and tone. Normal strength in all tested extremity muscles. Sensory.: intact to touch ,pinprick .position and vibratory sensation.  Coordination: Rapid alternating movements normal in all extremities. Finger-to-nose and heel-to-shin performed accurately bilaterally. Gait and Station: Arises from chair without difficulty. Stance is normal. Gait demonstrates normal stride length and balance and uses a walker.. Able to heel, toe and tandem walk with slight Reflexes: 1+ and symmetric. Toes downgoing.   NIHSS  2 Modified Rankin  2   ASSESSMENT: 71 year old Caucasian lady with left PCA subacute infarct and July 2023 of cryptogenic etiology.  He has significant residual right-sided numbness and. and mild memory and cognitive impairment.  Vascular risk factors hypertension.  She also has tiny asymptomatic right ICA aneurysm     PLAN:I had a long d/w patient and her sister about her recent cryptogenic stroke, memory loss and right sided visual field loss,risk for recurrent stroke/TIAs, personally independently reviewed imaging studies and stroke evaluation results and answered questions.Continue aspirin 81 mg daily  for secondary stroke prevention and maintain strict control of hypertension with blood pressure goal below 130/90, diabetes with hemoglobin A1c goal below 6.5% and lipids with LDL cholesterol goal below 70 mg/dL. I also advised the patient to eat a healthy diet with  plenty of whole grains, cereals, fruits and vegetables, exercise regularly and maintain ideal body weight.  Check memory panel labs EEG.  I advised the patient to participate in cognitively challenging activities like solving crossword puzzles, playing bridge and sudoku.  We also discussed memory, refer to cardiac electrophysiology for loop recorder  insertion for paroxysmal A-fib.  Followup in the future with my nurse practitioner months or call earlier if necessary. Greater than 50% of time during this 35 minute visit was spent on counseling,explanation of diagnosis, planning of further management, discussion with patient and family and coordination of care Antony Contras, MD Note: This document was prepared with digital dictation and possible smart phrase technology. Any transcriptional errors that result from this process are unintentional

## 2022-08-31 ENCOUNTER — Ambulatory Visit: Payer: PPO | Admitting: Cardiovascular Disease

## 2022-08-31 ENCOUNTER — Other Ambulatory Visit: Payer: Self-pay | Admitting: Family Medicine

## 2022-08-31 ENCOUNTER — Other Ambulatory Visit: Payer: Self-pay | Admitting: *Deleted

## 2022-08-31 DIAGNOSIS — D509 Iron deficiency anemia, unspecified: Secondary | ICD-10-CM

## 2022-08-31 NOTE — Progress Notes (Signed)
Lab orders entered for New Patient appt

## 2022-09-03 ENCOUNTER — Other Ambulatory Visit: Payer: Self-pay | Admitting: Family Medicine

## 2022-09-04 ENCOUNTER — Ambulatory Visit: Payer: PPO | Admitting: Family Medicine

## 2022-09-04 ENCOUNTER — Telehealth: Payer: Self-pay

## 2022-09-04 ENCOUNTER — Telehealth: Payer: Self-pay | Admitting: Family Medicine

## 2022-09-04 NOTE — Telephone Encounter (Signed)
Spoke with patient, patient scheduled for 09/05/22 at 1pm.

## 2022-09-04 NOTE — Telephone Encounter (Signed)
Patient states she is having allergic to medication. Symptoms as follows:  Continuously Itchy Hives under left breast with Rash/Blisters-starting to form on right breast  Thirsty all the time-lips stick together-frequent urination all day and night (every couple of hours) - keeping Patient from sleep  Transferred to Triage

## 2022-09-04 NOTE — Telephone Encounter (Signed)
Patient Name: Robin Arellano Gender: Female DOB: 1950/12/03 Age: 71 Y 37 M 6 D Return Phone Number: [phone] (Secondary) Address: City/ State/ Zip: [place]  [address] [facility] [place] Client Site [place] [facility] [place] [facility] Contact Type Call Who Is Calling Patient / Member / Family / Caregiver Call Type Triage / Clinical Relationship To Patient Self Return Phone Number [phone] (Secondary) Chief Complaint Allergic reaction (unknown symptoms) Reason for Call Symptomatic / Request for Benjamin Perez states, she is calling from answering service, patient is having an allergic reaction to a medication. She has itchy hives under left breast with rash and blisters. Starting to form on right breast, but left breast is unbearable. thirsty all the time. Lips stick together, has frequent urination, keeping her from sleep. She needs to be triage. sees Dr. Windy Kalata Taking Calcium Translation No Nurse Assessment Nurse: Hassell Done, RN, Joelene Millin Date/Time Eilene Ghazi Time): 09/04/2022 8:56:35 AM Confirm and document reason for call. If symptomatic, describe symptoms. ---caller states she has hives under her left breast with blisters. she also c/o frequent urination and a dry mouth. no fever. Does the patient have any new or worsening symptoms? ---Yes Will a triage be completed? ---Yes Related visit to physician within the last 2 weeks? ---No Does the PT have any chronic conditions? (i.e. diabetes, asthma, this includes High risk factors for pregnancy, etc.) ---Yes List chronic conditions. ---CKD stage 3, heart aneurysm, hx of stroke Is this a behavioral health or substance abuse call? ---No Guidelines Guideline Title Affirmed Question Affirmed Notes Nurse Date/Time (Eastern Time) Rash or Redness - Localized [1] Looks infected (spreading redness, pus) AND [2] large Alanda Amass 09/04/2022 8:58:29 AM  Guidelines Guideline Title Affirmed Question Affirmed Notes Nurse Date/Time Eilene Ghazi Time) red area (> 2 in. or 5 cm) Disp. Time Eilene Ghazi Time) Disposition Final User 09/04/2022 9:01:13 AM See HCP within 4 Hours (or PCP triage) Yes Hassell Done, RN, Joelene Millin Final Disposition 09/04/2022 9:01:13 AM See HCP within 4 Hours (or PCP triage) Yes Hassell Done, RN, Renea Ee Disagree/Comply Comply Caller Understands Yes PreDisposition Call Doctor Care Advice Given Per Guideline CALL BACK IF: * You become worse CARE ADVICE given per Rash - Localized and Cause Unknown (Adult) guideline. SEE HCP (OR PCP TRIAGE) WITHIN 4 HOURS: * IF OFFICE WILL BE OPEN: You need to be seen within the next 3 or 4 hours. Call your doctor (or NP/PA) now or as soon as the office opens. Referrals REFERRED TO PCP OFFICE

## 2022-09-04 NOTE — Telephone Encounter (Signed)
Caller States: -pt has possible uti, medication reaction, rash, and possible COVID-19 positive. -Pt has history of heart attack and thinks potassium should be ceased, but history suggests not.  -pt needs to be seen within 4 hours or go to UC/ER  Pt warm transferred.  Pt states: -Doesn't know why nurse thinks she might be covid-19. -She has "been dealing with this for several weeks"  -she does not want to go to UC.    Pt acknowledged that a mask may be requested.  Pt declined 2 appointment options at [facility].  Awaiting follow up notes for PCP team to review and advise.

## 2022-09-05 ENCOUNTER — Ambulatory Visit (INDEPENDENT_AMBULATORY_CARE_PROVIDER_SITE_OTHER): Payer: PPO | Admitting: Family Medicine

## 2022-09-05 ENCOUNTER — Encounter: Payer: Self-pay | Admitting: Family Medicine

## 2022-09-05 VITALS — BP 130/74 | HR 71 | Temp 98.4°F | Ht 63.0 in | Wt 144.0 lb

## 2022-09-05 DIAGNOSIS — B372 Candidiasis of skin and nail: Secondary | ICD-10-CM | POA: Diagnosis not present

## 2022-09-05 DIAGNOSIS — Z79899 Other long term (current) drug therapy: Secondary | ICD-10-CM

## 2022-09-05 MED ORDER — NYSTATIN 100000 UNIT/GM EX CREA: 1.0000 | TOPICAL_CREAM | Freq: Two times a day (BID) | CUTANEOUS | 1 refills | Status: DC

## 2022-09-05 NOTE — Progress Notes (Signed)
New Hematology/Oncology Consult   Requesting MD: Dr. Esther Hardy  [phone]      Reason for Consult: Iron deficiency anemia  HPI: Robin Arellano is a 71 year old woman referred for evaluation of iron deficiency anemia.  CBC 08/03/2022-hemoglobin 9.8, MCV 96, normal white count and platelets; ferritin 163, iron 52, transferrin low at 166, saturation 22%, TIBC low at 232.  Stool returned negative for occult blood 08/22/2022.  Most recent basic metabolic panel 0/98/1191 creatinine 1.5.  Comparison CBCs over the past 4 months with hemoglobin in the 7-9 range.  Colonoscopy 03/29/2021-diverticulosis left colon.    Upper EUS (done to evaluate moderate acute pancreatitis, unclear etiology, transiently elevated liver test, fusiform dilatation of CBD) 05/12/2020-normal esophagus, moderate nonspecific distal gastritis, normal duodenum.  Endosonographic finding-amorphous shadowing debris/sludge or perhaps small stones in the gallbladder.     Past Medical History:  Diagnosis Date   Allergy    Anemia    Anxiety    on meds   Back pain    Blood transfusion without reported diagnosis    Cataract    Chronic female pelvic pain    Chronic kidney disease    Coronary artery disease    mild, non-obstructive 11/2018   Depression    on meds   Family history of adverse reaction to anesthesia    sister had difficulty waking up   Fibromyalgia    H/O leukocytosis    Headache    Heart murmur    Hyperlipidemia    on meds   Hypertension    on meds   MI (myocardial infarction) ([place])    Pt states she did not have a MI- EKG was normal, was GERD   Osteoarthritis    on meds   Ovarian cyst, right    PONV (postoperative nausea and vomiting)    Post-operative nausea and vomiting    Stroke ([place]) 05/14/2022   L PCA   SVD (spontaneous vaginal delivery)    x 2   Vitamin D deficiency      Past Surgical History:  Procedure Laterality Date   ABDOMINAL HYSTERECTOMY  1994   TAH.BSO   ANTERIOR CERVICAL  DECOMP/DISCECTOMY FUSION  2019   APPENDECTOMY  1975   BACK SURGERY  2023   BIOPSY  05/12/2020   Procedure: BIOPSY;  Surgeon: Milus Banister, MD;  Location: [facility];  Service: Endoscopy;;   CARDIAC CATHETERIZATION  2020   CHOLECYSTECTOMY N/A 07/21/2020   Procedure: LAPAROSCOPIC CHOLECYSTECTOMY WITH INTRAOPERATIVE CHOLANGIOGRAM;  Surgeon: [name], MD;  Location: [facility];  Service: General;  Laterality: N/A;   COLONOSCOPY  08/12/2017   Hx TA (piecemeal)Jacobs-MAC-suprep (good)   ESOPHAGOGASTRODUODENOSCOPY (EGD) WITH PROPOFOL N/A 05/12/2020   Procedure: ESOPHAGOGASTRODUODENOSCOPY (EGD) WITH PROPOFOL;  Surgeon: Milus Banister, MD;  [facility];  Service: Endoscopy;  Laterality: N/A;   EUS N/A 05/12/2020   Procedure: UPPER ENDOSCOPIC ULTRASOUND (EUS) RADIAL;  Surgeon: Milus Banister, MD;  [facility];  Service: Endoscopy;  Laterality: N/A;   KNEE SURGERY Bilateral 1996   x 2 - arthroscopic   LEFT HEART CATH AND CORONARY ANGIOGRAPHY N/A 11/25/2018   Procedure: LEFT HEART CATH AND CORONARY ANGIOGRAPHY;  Surgeon: Troy Sine, MD;  Location: [facility];  Service: Cardiovascular;  Laterality: N/A;   PELVIC LAPAROSCOPY  1989   W LYSIS OF ADHESIONS/L SALPINGONEOSTOMY   TUBAL LIGATION     WISDOM TOOTH EXTRACTION       Current Outpatient Medications:    albuterol (VENTOLIN HFA) 108 (90  Base) MCG/ACT inhaler, Inhale 2 puffs into the lungs every 4 (four) hours as needed for wheezing or shortness of breath., Disp: 8 g, Rfl: 0   ALPRAZolam (XANAX) 0.5 MG tablet, TAKE 1 TABLET(0.5 MG) BY MOUTH TWICE DAILY AS NEEDED FOR SLEEP OR ANXIETY, Disp: 30 tablet, Rfl: 3   amLODipine (NORVASC) 10 MG tablet, TAKE 1 TABLET(10 MG) BY MOUTH DAILY, Disp: 90 tablet, Rfl: 3   Ascorbic Acid (VITAMIN C) 1000 MG tablet, Take 1,000 mg by mouth in the morning., Disp: , Rfl:    aspirin EC 81 MG tablet, Take 1 tablet (81 mg total) by mouth daily. Swallow whole., Disp: 90 tablet,  Rfl: 3   butalbital-acetaminophen-caffeine (FIORICET) 50-325-40 MG tablet, TAKE 1 TABLET BY MOUTH EVERY 6 HOURS AS NEEDED FOR HEADACHE OR MIGRAINE, Disp: 14 tablet, Rfl: 1   Calcium Carbonate Antacid (TUMS PO), Take 1 tablet by mouth daily as needed (stomach pain)., Disp: , Rfl:    Cholecalciferol (VITAMIN D) 50 MCG (2000 UT) tablet, Take 2,000 Units by mouth in the morning., Disp: , Rfl:    cloNIDine (CATAPRES) 0.1 MG tablet, TAKE 1 TABLET(0.1 MG) BY MOUTH TWICE DAILY, Disp: 90 tablet, Rfl: 3   ezetimibe (ZETIA) 10 MG tablet, TAKE 1 TABLET(10 MG) BY MOUTH DAILY, Disp: 90 tablet, Rfl: 3   hydrALAZINE (APRESOLINE) 100 MG tablet, Take 1 tablet (100 mg total) by mouth 3 (three) times daily., Disp: 270 tablet, Rfl: 1   HYDROcodone-acetaminophen (NORCO/VICODIN) 5-325 MG tablet, take 1-2 tablets every 8 hours as needed for pain, Disp: , Rfl:    methocarbamol (ROBAXIN) 500 MG tablet, Take 1 tablet (500 mg total) by mouth every 6 (six) hours as needed for muscle spasms., Disp: 30 tablet, Rfl: 3   nystatin (MYCOSTATIN) 100000 UNIT/ML suspension, Take 5 mLs (500,000 Units total) by mouth 4 (four) times daily., Disp: 60 mL, Rfl: 0   nystatin cream (MYCOSTATIN), Apply 1 Application topically 2 (two) times daily., Disp: 30 g, Rfl: 1   ondansetron (ZOFRAN) 4 MG tablet, Take 1 tablet (4 mg total) by mouth every 4 (four) hours as needed for nausea or vomiting., Disp: 60 tablet, Rfl: 3   pantoprazole (PROTONIX) 40 MG tablet, TAKE 1 TABLET(40 MG) BY MOUTH DAILY, Disp: 90 tablet, Rfl: 3   Potassium Chloride ER 20 MEQ TBCR, TAKE 1 TABLET BY MOUTH IN THE MORNING AND AT BEDTIME, Disp: 180 tablet, Rfl: 0   pravastatin (PRAVACHOL) 40 MG tablet, Take 1 tablet (40 mg total) by mouth every evening., Disp: 90 tablet, Rfl: 2   UNABLE TO FIND, Med Name: Swiss Kriss, Disp: , Rfl:    valACYclovir (VALTREX) 500 MG tablet, TAKE 1 TABLET BY MOUTH TWICE DAILY FOR 3 TO 5 DAYS THEN TAKE DAILY AS NEEDED, Disp: 30 tablet, Rfl: 0    cephALEXin (KEFLEX) 500 MG capsule, Take 1 capsule (500 mg total) by mouth 4 (four) times daily. (Patient not taking: Reported on 09/06/2022), Disp: 14 capsule, Rfl: 0   polyethylene glycol powder (GLYCOLAX/MIRALAX) 17 GM/SCOOP powder, Take 17 g by mouth daily as needed. (Patient not taking: Reported on 09/06/2022), Disp: 238 g, Rfl: 0:    Allergies  Allergen Reactions   Flexeril [Cyclobenzaprine] Anaphylaxis, Hives, Itching and Swelling   Lidocaine Hives   Zanaflex [Tizanidine] Anaphylaxis, Hives, Itching and Swelling   Neurontin [Gabapentin] Itching and Swelling   Latex Rash    FH: Sister with smoldering multiple myeloma; sister with MDS.  SOCIAL HISTORY: She lives in [place] with her husband and daughter.  She is retired from Personal assistant.  No tobacco or alcohol use.  Review of Systems: No fevers or sweats.  She has a good appetite.  Some weight loss.  She denies bleeding.  Specifically no bloody or black stools.  No hematuria.  No change in bowel habits.  She has mild intermittent nausea.  No dysphagia.  Occasional dyspnea.  Main complaint is feeling weak and tired.  Numbness/tingling in the feet.  No issues with recurrent infections.  No recent infections.  She walks with a walker.  She reports issues with peripheral vision since the stroke earlier this year.  Physical Exam:  Blood pressure (!) 154/59, pulse 61, temperature 98.1 F (36.7 C), temperature source Oral, resp. rate 18, height _0  (1.6 m), weight 142 lb 9.6 oz (64.7 kg), SpO2 100 %.  HEENT: Sclera anicteric.  No thrush or ulcers. Lungs: Lungs clear bilaterally. Cardiac: Regular rate and rhythm. Abdomen: Abdomen soft and nontender.  No hepatosplenomegaly.  No mass. Vascular: No leg edema. Lymph nodes: No palpable cervical, supraclavicular, axillary or inguinal lymph nodes. Neurologic: Alert and oriented. Skin: Small ecchymoses dorsal aspect of hands.  LABS:   Recent Labs    09/06/22 1404  WBC 7.1  HGB 8.6*   HCT 27.6*  PLT 295  Peripheral blood smear-polychromasia not increased, no nucleated red blood cells, few ovalocytes; no blasts or other young forms, majority of the white blood cells are mature appearing neutrophils, few plasmacytoid lymphs; platelets appear normal in number.  No results for input(s): "NA", "K", "CL", "CO2", "GLUCOSE", "BUN", "CREATININE", "CALCIUM" in the last 72 hours.    RADIOLOGY:  CT LUMBAR SPINE WO CONTRAST  Result Date: 08/22/2022 CLINICAL DATA:  Radiculopathy, lumbar region. EXAM: CT LUMBAR SPINE WITHOUT CONTRAST TECHNIQUE: Multidetector CT imaging of the lumbar spine was performed without intravenous contrast administration. Multiplanar CT image reconstructions were also generated. RADIATION DOSE REDUCTION: This exam was performed according to the departmental dose-optimization program which includes automated exposure control, adjustment of the mA and/or kV according to patient size and/or use of iterative reconstruction technique. COMPARISON:  None Available. FINDINGS: Segmentation: 5 lumbar type vertebrae. Alignment: Grade 1 anterolisthesis at L4-5, unchanged Vertebrae: L4-5 PLIF with bilateral laminectomies. No abnormal lucency. No acute osseous abnormality. Paraspinal and other soft tissues: Dorsal postsurgical changes at the L3-5 levels. Calcific aortic atherosclerosis. Disc levels: L1-2: Unremarkable. L2-3: Unremarkable. L3-4: Unremarkable. L4-5: Postsurgical change without spinal canal or right neural foraminal stenosis. Mild left neural foraminal stenosis. L5-S1: No spinal canal or neural foraminal stenosis. IMPRESSION: 1. L4-5 PLIF with bilateral laminectomies. Mild left neural foraminal stenosis. 2. No acute osseous abnormality. Aortic Atherosclerosis (ICD10-I70.0). Electronically Signed   By: Ulyses Jarred M.D.   On: 08/22/2022 02:49    Assessment and Plan:   Anemia Chronic kidney disease Hypertension History of stroke History of pancreatitis  Ms.  Arellano was referred for evaluation of anemia.  She does not appear to have iron deficiency.  We discussed the possibility of MDS, low-grade leukemia/lymphoma.  Chronic kidney disease may be contributing.  We obtained additional labs today and will contact her once results are available.  We discussed the possibility of a bone marrow biopsy.  She agrees with this plan.  She will return for follow-up in approximately 2 weeks.  Patient seen with Dr. Benay Spice.    Robin Card, NP 09/06/2022, 3:22 PM   This was a shared visit with Robin Arellano.  Robin Arellano was interviewed and examined.  I reviewed the peripheral blood smear.  She is referred for evaluation of anemia.  The anemia has been present for at least months.  She has multiple comorbid conditions.  We discussed the differential diagnosis with Robin Arellano and her sister.  We obtained additional diagnostic laboratory evaluation today.  The differential diagnosis includes anemia secondary to renal insufficiency, a hematologic malignancy, and myelodysplasia.  I was present for greater than 50% of today's visit.  I performed medical decision making.  Robin Manson, MD

## 2022-09-05 NOTE — Progress Notes (Signed)
Subjective:     Patient ID: Robin Arellano, female    DOB: 01-11-1951, 71 y.o.   MRN: [id number]  Chief Complaint  Patient presents with   Rash    Possible allergic reaction to potassium     HPI-here w/sister. Rash L breast x 2 wks, some on R for 1 wk. Using neosporin.  Concerned may be from potassium.  No rash anywhere else.   Health Maintenance Due  Topic Date Due   Medicare Annual Wellness (AWV)  03/22/2019    Past Medical History:  Diagnosis Date   Allergy    Anemia    Anxiety    on meds   Back pain    Blood transfusion without reported diagnosis    Cataract    Chronic female pelvic pain    Chronic kidney disease    Coronary artery disease    mild, non-obstructive 11/2018   Depression    on meds   Family history of adverse reaction to anesthesia    sister had difficulty waking up   Fibromyalgia    H/O leukocytosis    Headache    Heart murmur    Hyperlipidemia    on meds   Hypertension    on meds   MI (myocardial infarction) ([place])    Pt states she did not have a MI- EKG was normal, was GERD   Osteoarthritis    on meds   Ovarian cyst, right    PONV (postoperative nausea and vomiting)    Post-operative nausea and vomiting    Stroke ([place]) 05/14/2022   L PCA   SVD (spontaneous vaginal delivery)    x 2   Vitamin D deficiency     Past Surgical History:  Procedure Laterality Date   ABDOMINAL HYSTERECTOMY  1994   TAH.BSO   ANTERIOR CERVICAL DECOMP/DISCECTOMY FUSION  2019   APPENDECTOMY  1975   BACK SURGERY  2023   BIOPSY  05/12/2020   Procedure: BIOPSY;  Surgeon: Milus Banister, MD;  [facility];  Service: Endoscopy;;   CARDIAC CATHETERIZATION  2020   CHOLECYSTECTOMY N/A 07/21/2020   Procedure: LAPAROSCOPIC CHOLECYSTECTOMY WITH INTRAOPERATIVE CHOLANGIOGRAM;  Surgeon: [name], MD;  Location: [facility];  Service: General;  Laterality: N/A;   COLONOSCOPY  08/12/2017   Hx TA (piecemeal)Jacobs-MAC-suprep (good)    ESOPHAGOGASTRODUODENOSCOPY (EGD) WITH PROPOFOL N/A 05/12/2020   Procedure: ESOPHAGOGASTRODUODENOSCOPY (EGD) WITH PROPOFOL;  Surgeon: Milus Banister, MD;  [facility];  Service: Endoscopy;  Laterality: N/A;   EUS N/A 05/12/2020   Procedure: UPPER ENDOSCOPIC ULTRASOUND (EUS) RADIAL;  Surgeon: Milus Banister, MD;  [facility];  Service: Endoscopy;  Laterality: N/A;   KNEE SURGERY Bilateral 1996   x 2 - arthroscopic   LEFT HEART CATH AND CORONARY ANGIOGRAPHY N/A 11/25/2018   Procedure: LEFT HEART CATH AND CORONARY ANGIOGRAPHY;  Surgeon: Troy Sine, MD;  Location: [facility];  Service: Cardiovascular;  Laterality: N/A;   PELVIC LAPAROSCOPY  1989   W LYSIS OF ADHESIONS/L SALPINGONEOSTOMY   TUBAL LIGATION     WISDOM TOOTH EXTRACTION      Outpatient Medications Prior to Visit  Medication Sig Dispense Refill   albuterol (VENTOLIN HFA) 108 (90 Base) MCG/ACT inhaler Inhale 2 puffs into the lungs every 4 (four) hours as needed for wheezing or shortness of breath. 8 g 0   ALPRAZolam (XANAX) 0.5 MG tablet TAKE 1 TABLET(0.5 MG) BY MOUTH TWICE DAILY AS NEEDED FOR SLEEP OR ANXIETY 30 tablet 3  amLODipine (NORVASC) 10 MG tablet TAKE 1 TABLET(10 MG) BY MOUTH DAILY 90 tablet 3   Ascorbic Acid (VITAMIN C) 1000 MG tablet Take 1,000 mg by mouth in the morning.     aspirin EC 81 MG tablet Take 1 tablet (81 mg total) by mouth daily. Swallow whole. 90 tablet 3   butalbital-acetaminophen-caffeine (FIORICET) 50-325-40 MG tablet TAKE 1 TABLET BY MOUTH EVERY 6 HOURS AS NEEDED FOR HEADACHE OR MIGRAINE 14 tablet 1   Calcium Carbonate Antacid (TUMS PO) Take 2 tablets by mouth daily as needed (stomach pain).     cephALEXin (KEFLEX) 500 MG capsule Take 1 capsule (500 mg total) by mouth 4 (four) times daily. 14 capsule 0   Cholecalciferol (VITAMIN D) 50 MCG (2000 UT) tablet Take 2,000 Units by mouth in the morning.     cloNIDine (CATAPRES) 0.1 MG tablet TAKE 1 TABLET(0.1 MG) BY MOUTH  TWICE DAILY 90 tablet 3   ezetimibe (ZETIA) 10 MG tablet TAKE 1 TABLET(10 MG) BY MOUTH DAILY 90 tablet 3   hydrALAZINE (APRESOLINE) 100 MG tablet Take 1 tablet (100 mg total) by mouth 3 (three) times daily. 270 tablet 1   HYDROcodone-acetaminophen (NORCO/VICODIN) 5-325 MG tablet take 1-2 tablets every 8 hours as needed for pain     methocarbamol (ROBAXIN) 500 MG tablet Take 1 tablet (500 mg total) by mouth every 6 (six) hours as needed for muscle spasms. 30 tablet 3   nystatin (MYCOSTATIN) 100000 UNIT/ML suspension Take 5 mLs (500,000 Units total) by mouth 4 (four) times daily. 60 mL 0   ondansetron (ZOFRAN) 4 MG tablet Take 1 tablet (4 mg total) by mouth every 4 (four) hours as needed for nausea or vomiting. 60 tablet 3   pantoprazole (PROTONIX) 40 MG tablet TAKE 1 TABLET(40 MG) BY MOUTH DAILY 90 tablet 3   polyethylene glycol powder (GLYCOLAX/MIRALAX) 17 GM/SCOOP powder Take 17 g by mouth daily as needed. 238 g 0   Potassium Chloride ER 20 MEQ TBCR TAKE 1 TABLET BY MOUTH IN THE MORNING AND AT BEDTIME 180 tablet 0   pravastatin (PRAVACHOL) 40 MG tablet Take 1 tablet (40 mg total) by mouth every evening. 90 tablet 2   UNABLE TO FIND Med Name: Swiss Kriss     valACYclovir (VALTREX) 500 MG tablet TAKE 1 TABLET BY MOUTH TWICE DAILY FOR 3 TO 5 DAYS THEN TAKE DAILY AS NEEDED 30 tablet 0   No facility-administered medications prior to visit.    Allergies  Allergen Reactions   Flexeril [Cyclobenzaprine] Anaphylaxis, Hives, Itching and Swelling   Lidocaine Hives   Zanaflex [Tizanidine] Anaphylaxis, Hives, Itching and Swelling   Neurontin [Gabapentin] Itching and Swelling   Latex Rash   ROS neg/noncontributory except as noted HPI/below      Objective:     BP 130/74   Pulse 71   Temp 98.4 F (36.9 C) (Temporal)   Ht _0  (1.6 m)   Wt 144 lb (65.3 kg)   LMP  (LMP Unknown)   SpO2 98%   BMI 25.51 kg/m  Wt Readings from Last 3 Encounters:  09/05/22 144 lb (65.3 kg)  08/28/22 140 lb  (63.5 kg)  08/15/22 140 lb 12.8 oz (63.9 kg)    Physical Exam   Gen: WDWN NAD HEENT: NCAT, conjunctiva not injected, sclera nonicteric EXT:  no edema MSK: walker  NEURO: A&O x3.  CN II-XII intact.  PSYCH: normal mood. Good eye contact Skin: Under both breasts, white, gooey substance.  Redness, satellite lesions.  Left greater  than right.     Assessment & Plan:   Problem List Items Addressed This Visit   None Visit Diagnoses     Yeast infection of the skin    -  Primary   Relevant Medications   nystatin cream (MYCOSTATIN)   High risk medication use       Relevant Orders   Basic metabolic panel     1.  Yeast infection of the skin underneath the breasts-prescription written for nystatin cream to be used twice daily.  Keep area dry.  Can use dry washcloths under the breasts at night.  Advised that I highly doubt this is from potassium.  It is only under the breasts.  Does not appear to be shingles either.  Monitor closely 2.  High risk medication use-patient is on potassium.  Will order BMP.  She is having labs done tomorrow at hematology.  Advised to ask if they would be willing to draw this as well as sometimes she has a hard stick.  Meds ordered this encounter  Medications   nystatin cream (MYCOSTATIN)    Sig: Apply 1 Application topically 2 (two) times daily.    Dispense:  30 g    Refill:  1    Wellington Hampshire, MD

## 2022-09-06 ENCOUNTER — Inpatient Hospital Stay: Payer: PPO | Attending: Oncology

## 2022-09-06 ENCOUNTER — Encounter: Payer: Self-pay | Admitting: Nurse Practitioner

## 2022-09-06 ENCOUNTER — Other Ambulatory Visit (HOSPITAL_BASED_OUTPATIENT_CLINIC_OR_DEPARTMENT_OTHER): Payer: Self-pay

## 2022-09-06 ENCOUNTER — Telehealth: Payer: Self-pay

## 2022-09-06 ENCOUNTER — Inpatient Hospital Stay (HOSPITAL_BASED_OUTPATIENT_CLINIC_OR_DEPARTMENT_OTHER): Payer: PPO | Admitting: Nurse Practitioner

## 2022-09-06 VITALS — BP 154/59 | HR 61 | Temp 98.1°F | Resp 18 | Ht 63.0 in | Wt 142.6 lb

## 2022-09-06 DIAGNOSIS — D509 Iron deficiency anemia, unspecified: Secondary | ICD-10-CM

## 2022-09-06 DIAGNOSIS — Z79899 Other long term (current) drug therapy: Secondary | ICD-10-CM

## 2022-09-06 DIAGNOSIS — Z8673 Personal history of transient ischemic attack (TIA), and cerebral infarction without residual deficits: Secondary | ICD-10-CM | POA: Insufficient documentation

## 2022-09-06 DIAGNOSIS — I129 Hypertensive chronic kidney disease with stage 1 through stage 4 chronic kidney disease, or unspecified chronic kidney disease: Secondary | ICD-10-CM | POA: Insufficient documentation

## 2022-09-06 DIAGNOSIS — N189 Chronic kidney disease, unspecified: Secondary | ICD-10-CM | POA: Diagnosis not present

## 2022-09-06 DIAGNOSIS — D649 Anemia, unspecified: Secondary | ICD-10-CM

## 2022-09-06 LAB — CBC WITH DIFFERENTIAL (CANCER CENTER ONLY)
Abs Immature Granulocytes: 0.01 10*3/uL (ref 0.00–0.07)
Basophils Absolute: 0.1 10*3/uL (ref 0.0–0.1)
Basophils Relative: 1 %
Eosinophils Absolute: 0.3 10*3/uL (ref 0.0–0.5)
Eosinophils Relative: 4 %
HCT: 27.6 % — ABNORMAL LOW (ref 36.0–46.0)
Hemoglobin: 8.6 g/dL — ABNORMAL LOW (ref 12.0–15.0)
Immature Granulocytes: 0 %
Lymphocytes Relative: 30 %
Lymphs Abs: 2.1 10*3/uL (ref 0.7–4.0)
MCH: 31.2 pg (ref 26.0–34.0)
MCHC: 31.2 g/dL (ref 30.0–36.0)
MCV: 100 fL (ref 80.0–100.0)
Monocytes Absolute: 0.7 10*3/uL (ref 0.1–1.0)
Monocytes Relative: 9 %
Neutro Abs: 4 10*3/uL (ref 1.7–7.7)
Neutrophils Relative %: 56 %
Platelet Count: 295 10*3/uL (ref 150–400)
RBC: 2.76 MIL/uL — ABNORMAL LOW (ref 3.87–5.11)
RDW: 13.2 % (ref 11.5–15.5)
WBC Count: 7.1 10*3/uL (ref 4.0–10.5)
nRBC: 0 % (ref 0.0–0.2)

## 2022-09-06 LAB — CMP (CANCER CENTER ONLY)
ALT: 12 U/L (ref 0–44)
AST: 19 U/L (ref 15–41)
Albumin: 4.3 g/dL (ref 3.5–5.0)
Alkaline Phosphatase: 85 U/L (ref 38–126)
Anion gap: 13 (ref 5–15)
BUN: 22 mg/dL (ref 8–23)
CO2: 24 mmol/L (ref 22–32)
Calcium: 9.8 mg/dL (ref 8.9–10.3)
Chloride: 99 mmol/L (ref 98–111)
Creatinine: 1.67 mg/dL — ABNORMAL HIGH (ref 0.44–1.00)
GFR, Estimated: 33 mL/min — ABNORMAL LOW (ref >60)
Glucose, Bld: 98 mg/dL (ref 70–99)
Potassium: 3.9 mmol/L (ref 3.5–5.1)
Sodium: 136 mmol/L (ref 135–145)
Total Bilirubin: 0.3 mg/dL (ref 0.3–1.2)
Total Protein: 7.5 g/dL (ref 6.5–8.1)

## 2022-09-06 LAB — RETIC PANEL
Immature Retic Fract: 6.9 % (ref 2.3–15.9)
RBC.: 2.72 MIL/uL — ABNORMAL LOW (ref 3.87–5.11)
Retic Count, Absolute: 43.2 10*3/uL (ref 19.0–186.0)
Retic Ct Pct: 1.6 % (ref 0.4–3.1)
Reticulocyte Hemoglobin: 33.6 pg (ref >27.9)

## 2022-09-06 LAB — SAVE SMEAR(SSMR), FOR PROVIDER SLIDE REVIEW

## 2022-09-06 LAB — LACTATE DEHYDROGENASE: LDH: 162 U/L (ref 98–192)

## 2022-09-06 LAB — VITAMIN B12: Vitamin B-12: 418 pg/mL (ref 180–914)

## 2022-09-06 NOTE — Telephone Encounter (Signed)
Please ask lab to add a chemistry panel, erythropoietin level, multiple myeloma panel, light chains, B12 and reticulocyte count and LDH Per Sunni she adding the labs 

## 2022-09-07 LAB — KAPPA/LAMBDA LIGHT CHAINS
Kappa free light chain: 62.5 mg/L — ABNORMAL HIGH (ref 3.3–19.4)
Kappa, lambda light chain ratio: 1.28 (ref 0.26–1.65)
Lambda free light chains: 48.8 mg/L — ABNORMAL HIGH (ref 5.7–26.3)

## 2022-09-07 LAB — ERYTHROPOIETIN: Erythropoietin: 22.8 m[IU]/mL — ABNORMAL HIGH (ref 2.6–18.5)

## 2022-09-07 NOTE — Progress Notes (Signed)
Patient aware, referral placed for nephrology in 07/2022

## 2022-09-10 ENCOUNTER — Ambulatory Visit: Payer: PPO | Admitting: Cardiology

## 2022-09-11 LAB — MULTIPLE MYELOMA PANEL, SERUM
Albumin SerPl Elph-Mcnc: 3.4 g/dL (ref 2.9–4.4)
Albumin/Glob SerPl: 1.1 (ref 0.7–1.7)
Alpha 1: 0.3 g/dL (ref 0.0–0.4)
Alpha2 Glob SerPl Elph-Mcnc: 0.9 g/dL (ref 0.4–1.0)
B-Globulin SerPl Elph-Mcnc: 1.2 g/dL (ref 0.7–1.3)
Gamma Glob SerPl Elph-Mcnc: 0.8 g/dL (ref 0.4–1.8)
Globulin, Total: 3.2 g/dL (ref 2.2–3.9)
IgA: 427 mg/dL — ABNORMAL HIGH (ref 64–422)
IgG (Immunoglobin G), Serum: 834 mg/dL (ref 586–1602)
IgM (Immunoglobulin M), Srm: 141 mg/dL (ref 26–217)
Total Protein ELP: 6.6 g/dL (ref 6.0–8.5)

## 2022-09-12 ENCOUNTER — Ambulatory Visit (INDEPENDENT_AMBULATORY_CARE_PROVIDER_SITE_OTHER): Payer: PPO | Admitting: Neurology

## 2022-09-12 ENCOUNTER — Other Ambulatory Visit: Payer: Self-pay | Admitting: *Deleted

## 2022-09-12 DIAGNOSIS — R351 Nocturia: Secondary | ICD-10-CM

## 2022-09-12 DIAGNOSIS — R4182 Altered mental status, unspecified: Secondary | ICD-10-CM

## 2022-09-12 DIAGNOSIS — R413 Other amnesia: Secondary | ICD-10-CM

## 2022-09-13 ENCOUNTER — Other Ambulatory Visit: Payer: Self-pay

## 2022-09-13 ENCOUNTER — Ambulatory Visit: Payer: PPO | Attending: Neurological Surgery | Admitting: Rehabilitative and Restorative Service Providers"

## 2022-09-13 ENCOUNTER — Encounter: Payer: Self-pay | Admitting: Rehabilitative and Restorative Service Providers"

## 2022-09-13 DIAGNOSIS — R2689 Other abnormalities of gait and mobility: Secondary | ICD-10-CM | POA: Diagnosis not present

## 2022-09-13 DIAGNOSIS — M5459 Other low back pain: Secondary | ICD-10-CM | POA: Diagnosis present

## 2022-09-13 DIAGNOSIS — R2681 Unsteadiness on feet: Secondary | ICD-10-CM | POA: Diagnosis present

## 2022-09-13 DIAGNOSIS — M6281 Muscle weakness (generalized): Secondary | ICD-10-CM | POA: Insufficient documentation

## 2022-09-13 NOTE — Therapy (Signed)
OUTPATIENT PHYSICAL THERAPY NEURO EVALUATION   Patient Name: Robin Arellano MRN: [id number] DOB:1951/06/20, 71 y.o., female Today's Date: 09/13/2022   PCP: Tawnya Crook, MD REFERRING PROVIDER: Kristeen Miss, MD   PT End of Session - 09/13/22 1624     Visit Number 1    Number of Visits 17    Date for PT Re-Evaluation 11/12/22    Authorization Type [employer]    Progress Note Due on Visit 10    PT Start Time 1408    PT Stop Time 1452    PT Time Calculation (min) 44 min    Equipment Utilized During Treatment Gait belt    Activity Tolerance Patient tolerated treatment well    Behavior During Therapy WFL for tasks assessed/performed             Past Medical History:  Diagnosis Date   Allergy    Anemia    Anxiety    on meds   Back pain    Blood transfusion without reported diagnosis    Cataract    Chronic female pelvic pain    Chronic kidney disease    Coronary artery disease    mild, non-obstructive 11/2018   Depression    on meds   Family history of adverse reaction to anesthesia    sister had difficulty waking up   Fibromyalgia    H/O leukocytosis    Headache    Heart murmur    Hyperlipidemia    on meds   Hypertension    on meds   MI (myocardial infarction) ([place])    Pt states she did not have a MI- EKG was normal, was GERD   Osteoarthritis    on meds   Ovarian cyst, right    PONV (postoperative nausea and vomiting)    Post-operative nausea and vomiting    Stroke ([place]) 05/14/2022   L PCA   SVD (spontaneous vaginal delivery)    x 2   Vitamin D deficiency    Past Surgical History:  Procedure Laterality Date   ABDOMINAL HYSTERECTOMY  1994   TAH.BSO   ANTERIOR CERVICAL DECOMP/DISCECTOMY FUSION  2019   APPENDECTOMY  1975   BACK SURGERY  2023   BIOPSY  05/12/2020   Procedure: BIOPSY;  Surgeon: Milus Banister, MD;  Location: [facility];  Service: Endoscopy;;   CARDIAC CATHETERIZATION  2020   CHOLECYSTECTOMY N/A 07/21/2020    Procedure: LAPAROSCOPIC CHOLECYSTECTOMY WITH INTRAOPERATIVE CHOLANGIOGRAM;  Surgeon: [name], MD;  Location: [facility];  Service: General;  Laterality: N/A;   COLONOSCOPY  08/12/2017   Hx TA (piecemeal)Jacobs-MAC-suprep (good)   ESOPHAGOGASTRODUODENOSCOPY (EGD) WITH PROPOFOL N/A 05/12/2020   Procedure: ESOPHAGOGASTRODUODENOSCOPY (EGD) WITH PROPOFOL;  Surgeon: Milus Banister, MD;  Location: [facility];  Service: Endoscopy;  Laterality: N/A;   EUS N/A 05/12/2020   Procedure: UPPER ENDOSCOPIC ULTRASOUND (EUS) RADIAL;  Surgeon: Milus Banister, MD;  Location: [facility];  Service: Endoscopy;  Laterality: N/A;   KNEE SURGERY Bilateral 1996   x 2 - arthroscopic   LEFT HEART CATH AND CORONARY ANGIOGRAPHY N/A 11/25/2018   Procedure: LEFT HEART CATH AND CORONARY ANGIOGRAPHY;  Surgeon: Troy Sine, MD;  Location: [facility];  Service: Cardiovascular;  Laterality: N/A;   PELVIC LAPAROSCOPY  1989   W LYSIS OF ADHESIONS/L SALPINGONEOSTOMY   TUBAL LIGATION     WISDOM TOOTH EXTRACTION     Patient Active Problem List   Diagnosis Date Noted   Nausea & vomiting 06/01/2022  Chronic kidney disease, stage 3b ([place]) 05/27/2022   Mixed hyperlipidemia 05/27/2022   Pressure injury of skin 05/27/2022   COVID-19 virus infection 05/26/2022   Hyponatremia    Acute left PCA stroke ([place]) 05/22/2022   Hypertensive emergency    Pseudoaneurysm ([place]) 05/14/2022   Spondylolisthesis at L4-L5 level 04/26/2022   Hyperlipidemia 11/25/2020   Essential hypertension 07/11/2020   Common bile duct dilation    Acute pancreatitis 04/01/2020   Anxiety    Hypokalemia    Mild renal insufficiency    Elevated troponin    Hypertensive urgency    Elevated liver enzymes    Abnormal magnetic resonance imaging of abdomen    Coronary artery disease involving native coronary artery of native heart without angina pectoris    Chest pain    Herpes zoster without complication 05/39/7673   Abdominal pain 02/07/2018    Abdominal pain, epigastric 02/07/2018   Low back pain 07/09/2013   Neck pain 07/09/2013   Headache 07/09/2013   Fibromyalgia 07/09/2013    ONSET DATE: 08/04/22  REFERRING DIAG: M54.16 (ICD-10-CM) - Radiculopathy, lumbar region   THERAPY DIAG:  Other abnormalities of gait and mobility  Muscle weakness (generalized)  Unsteadiness on feet  Other low back pain  Rationale for Evaluation and Treatment: Rehabilitation  SUBJECTIVE:                                                                                                                                                                                             SUBJECTIVE STATEMENT: The patient is s/p low back surgery in July 2023.  She had a fall 1-2 weeks after surgery and notes she had a stroke. During her IP rehab stay, she stayed 34 days -- course was complicated by Covid diagnosis. She reports 17 falls prior to her back surgery, no falls since surgery. She is now using a RW for mobility. She walks short distances during the day without the RW.  She reports she is unable to stand for longer than 15 minutes. "I think the fall messed up my surgery." She reports she needs help with bathing, dressing, and all ADLs. She has visual changes that impact peripheral vision. PT inquired about OT and she does not want an OT referral. Pt accompanied by: self  PERTINENT HISTORY: HTN, CVA, Stage III kidney disease, lumbar laminectomy, h/o cervical laminectomy, RA, fibromyalgia, pancreatitis, h/o frequent falls and vertigo  PAIN:  Are you having pain? Yes: NPRS scale: 6/10 Pain location: achiness, generalized pain Pain description: constant Aggravating factors: constant pain Relieving factors: constant in nature  PRECAUTIONS: Fall  WEIGHT BEARING RESTRICTIONS: No  FALLS: Has  patient fallen in last 6 months? Yes, none since 1 major fall after surgery (around time of CVA)  LIVING ENVIRONMENT: Lives with: lives with their family and  lives with their spouse Lives in: House/apartment Stairs: Yes: External: 6 steps; bilateral but cannot reach both Has following equipment at home: Walker - 2 wheeled, Environmental consultant - 4 wheeled, and shower chair  PLOF: Independent  PATIENT GOALS: Getting more independent, walking without device.  Has not been able to drive x months before surgery.   Goal to improve strength and balance.  OBJECTIVE:   DIAGNOSTIC FINDINGS: 1. L4-5 PLIF with bilateral laminectomies. Mild left neural foraminal stenosis. 2. No acute osseous abnormality.  COGNITION: Overall cognitive status: Impaired and she reports daughter and husband help with medication mgmt   SENSATION: Numbness and tingling in her feet  COORDINATION: WNLs and equal bilaterally  MUSCLE TONE: WNLs  POSTURE: rounded shoulders and forward head  UPPER EXTREMITY ROM:  bilateral shoulder AROM to 90 degrees flexion and abduction  LOWER EXTREMITY ROM:   WFLs.  LOWER EXTREMITY MMT:    MMT Right Eval Left Eval  Hip flexion 3/5 3/5  Hip extension    Hip abduction    Hip adduction    Hip internal rotation    Hip external rotation    Knee flexion 4-/5 4-/5  Knee extension 4/5 4/5  Ankle dorsiflexion 3/5 3/5  Ankle plantarflexion    Ankle inversion    Ankle eversion    (Blank rows = not tested)  BED MOBILITY:  Gets in and out of bed independently  TRANSFERS: Assistive device utilized: Environmental consultant - 2 wheeled  Sit to stand: Modified independence Stand to sit: Modified independence  STAIRS: Level of Assistance: Min A Stair Negotiation Technique: Alternating Pattern  with Bilateral Rails Number of Stairs: 2  Height of Stairs: 6"  Comments: Reports needing help at home for stairs  GAIT: Gait pattern: decreased step length- Right, decreased step length- Left, decreased stride length, decreased hip/knee flexion- Right, decreased hip/knee flexion- Left, decreased ankle dorsiflexion- Right, decreased ankle dorsiflexion- Left, decreased  trunk rotation, narrow BOS, and poor foot clearance- Left Distance walked: 100 Assistive device utilized: Walker - 2 wheeled Level of assistance: CGA Comments: frequent L foot drag during gait, responds well with cues for heel strike  FUNCTIONAL TESTS:  Berg=27/56 Gait speed= 1.04 ft/sec  BERG BALANCE TEST Sitting to Standing: 3.      Stands independently using hands Standing Unsupported: 4.      Stands safely for 2 minutes Sitting Unsupported: 4.     Sits for 2 minutes independently Standing to Sitting: 4.     Sits safely with minimal use of hands Transfers: 3.     Transfers safely definite use of hands Standing with eyes closed: 3.     Stands 10 seconds with supervision Standing with feet together: 3.     Stands for 1 minute with supervision Reaching forward with outstretched arm: 1.     Reaches forward with supervision Retrieving object from the floor: 0.     Unable/needs assistance to keep from falling Turning to look behind: 1.     Needs supervision when turning Turning 360 degrees: 0.     Needs assistance Place alternate foot on stool: 0.     Unable, needs assist to keep from falling Standing with one foot in front: 1.     Needs help to step, but can hold for 15 seconds Standing on one foot: 0.  Unable  Total Score: 27/56  PATIENT SURVEYS:  N/a-- CVA, also is s/p back surgery  TODAY'S TREATMENT:                                                                                                                              DATE: 09/13/22    Sit to stand x 5 reps  PATIENT EDUCATION: Education details: HEP Person educated: Patient Education method: Explanation, Demonstration, and Handouts Education comprehension: verbalized understanding and returned demonstration  HOME EXERCISE PROGRAM: Access Code: [email or web address] URL: [email or web address] Date: 09/13/2022 Prepared by: Rudell Cobb  Exercises - Sit to Stand with Armchair  - 2 x daily - 7 x weekly - 1  sets - 5 reps  GOALS: Goals reviewed with patient? Yes  SHORT TERM GOALS: Target date: 10/11/2022  The patient will be indep with HEP. Goal status: INITIAL  2.  The patient will improve Berg balance score to > or equal to 33/56. Baseline: 27/56 Goal status: INITIAL  3.  The patient will improve gait speed to > or equal to 1.5 ft/sec. Baseline: 1.04 ft/sec Goal status: INITIAL  4.  The patient will ambulate mod indep with RW x 300 ft to demo improved household mobility. Baseline: walks short distances in home Goal status: INITIAL  5.  The patient will negotiate 4 steps with supervision with one handrail to improve access to home environment. Baseline: min A Goal status: INITIAL  LONG TERM GOALS: Target date: 11/08/2022  The patient will be indep with HEP progression. Baseline:  no HEP Goal status: INITIAL  2.  The patient will improve Berg score to > or equal to 38/56 to demo dec'ing risk for falls. Baseline: 27/56 Goal status: INITIAL  3.  The patient will improve gait speed to > or equal to 2.0 ft/sec to demo dec'ing risk for falls and improved community access. Baseline: 1.04 ft/sec Goal status: INITIAL  4.  The patient will negotiate household surfaces without a device mod indep. Baseline:  Uses RW Goal status: INITIAL  5.  The patient will report low back pain < or equal to 3/10 during standing activities x 10 minutes. Baseline: 6/10 today Goal status: INITIAL  ASSESSMENT:  CLINICAL IMPRESSION: Patient is a 71 y.o. female who was seen today for physical therapy evaluation and treatment for low back surgery and stroke in July 2023. The patient also notes COVID that complicated her recovery.  Patient presents with dec'd independence with all ADLs, IADLs, and household mobility since CVA.  She presents with impairments in muscle strength, endurance, balance, gait and ROM in [place].  PT to address deficits to promote improved safety and independence in the home.    OBJECTIVE IMPAIRMENTS: Abnormal gait, decreased activity tolerance, decreased balance, decreased endurance, decreased ROM, decreased strength, impaired UE functional use, and pain.   ACTIVITY LIMITATIONS: bending, standing, squatting, stairs, transfers, bathing, dressing, reach over head, and locomotion level  PARTICIPATION LIMITATIONS: meal prep, cleaning, laundry, medication management,  interpersonal relationship, driving, and community activity  PERSONAL FACTORS: 3+ comorbidities: HTN, CVA, low back surgery  are also affecting patient's functional outcome.   REHAB POTENTIAL: Good  CLINICAL DECISION MAKING: Stable/uncomplicated  EVALUATION COMPLEXITY: Low  PLAN:  PT FREQUENCY: 2x/week  PT DURATION: 8 weeks  PLANNED INTERVENTIONS: Therapeutic exercises, Therapeutic activity, Neuromuscular re-education, Balance training, Gait training, Patient/Family education, Self Care, Joint mobilization, Stair training, Visual/preceptual remediation/compensation, DME instructions, Aquatic Therapy, Dry Needling, Moist heat, Taping, Manual therapy, and Re-evaluation  PLAN FOR NEXT SESSION: Establish HEP (only 1 exercise today) slowly, work on gait safety and mechanics (longer stride), standing balance, LE strength, and low back/core stabilization.   [place], PT 09/13/2022, 4:46 PM

## 2022-09-16 NOTE — Progress Notes (Signed)
Kindly inform the patient that EEG or brainwave study was normal.  No evidence of seizure activity noted

## 2022-09-17 ENCOUNTER — Telehealth: Payer: Self-pay

## 2022-09-17 NOTE — Telephone Encounter (Signed)
-----   Message from Garvin Fila, MD sent at 09/16/2022  5:04 PM EST ----- Kindly inform the patient that EEG or brainwave study was normal.  No evidence of seizure activity noted

## 2022-09-17 NOTE — Telephone Encounter (Signed)
I left a VM for the patient (as per DPR) to inform her of normal results. A call back number was provided for any further questions or concerns.

## 2022-09-18 NOTE — Therapy (Incomplete)
OUTPATIENT PHYSICAL THERAPY NEURO TREATMENT   Patient Name: Robin Arellano MRN: [id number] DOB:Oct 17, 1951, 71 y.o., female Today's Date: 09/18/2022   PCP: Tawnya Crook, MD REFERRING PROVIDER: Kristeen Miss, MD     Past Medical History:  Diagnosis Date   Allergy    Anemia    Anxiety    on meds   Back pain    Blood transfusion without reported diagnosis    Cataract    Chronic female pelvic pain    Chronic kidney disease    Coronary artery disease    mild, non-obstructive 11/2018   Depression    on meds   Family history of adverse reaction to anesthesia    sister had difficulty waking up   Fibromyalgia    H/O leukocytosis    Headache    Heart murmur    Hyperlipidemia    on meds   Hypertension    on meds   MI (myocardial infarction) ([place])    Pt states she did not have a MI- EKG was normal, was GERD   Osteoarthritis    on meds   Ovarian cyst, right    PONV (postoperative nausea and vomiting)    Post-operative nausea and vomiting    Stroke ([place]) 05/14/2022   L PCA   SVD (spontaneous vaginal delivery)    x 2   Vitamin D deficiency    Past Surgical History:  Procedure Laterality Date   ABDOMINAL HYSTERECTOMY  1994   TAH.BSO   ANTERIOR CERVICAL DECOMP/DISCECTOMY FUSION  2019   APPENDECTOMY  1975   BACK SURGERY  2023   BIOPSY  05/12/2020   Procedure: BIOPSY;  Surgeon: Milus Banister, MD;  Location: [facility];  Service: Endoscopy;;   CARDIAC CATHETERIZATION  2020   CHOLECYSTECTOMY N/A 07/21/2020   Procedure: LAPAROSCOPIC CHOLECYSTECTOMY WITH INTRAOPERATIVE CHOLANGIOGRAM;  Surgeon: [name], MD;  Location: [facility];  Service: General;  Laterality: N/A;   COLONOSCOPY  08/12/2017   Hx TA (piecemeal)Jacobs-MAC-suprep (good)   ESOPHAGOGASTRODUODENOSCOPY (EGD) WITH PROPOFOL N/A 05/12/2020   Procedure: ESOPHAGOGASTRODUODENOSCOPY (EGD) WITH PROPOFOL;  Surgeon: Milus Banister, MD;  Location: [facility];  Service: Endoscopy;  Laterality: N/A;   EUS N/A  05/12/2020   Procedure: UPPER ENDOSCOPIC ULTRASOUND (EUS) RADIAL;  Surgeon: Milus Banister, MD;  Location: [facility];  Service: Endoscopy;  Laterality: N/A;   KNEE SURGERY Bilateral 1996   x 2 - arthroscopic   LEFT HEART CATH AND CORONARY ANGIOGRAPHY N/A 11/25/2018   Procedure: LEFT HEART CATH AND CORONARY ANGIOGRAPHY;  Surgeon: Troy Sine, MD;  Location: [facility];  Service: Cardiovascular;  Laterality: N/A;   PELVIC LAPAROSCOPY  1989   W LYSIS OF ADHESIONS/L SALPINGONEOSTOMY   TUBAL LIGATION     WISDOM TOOTH EXTRACTION     Patient Active Problem List   Diagnosis Date Noted   Nausea & vomiting 06/01/2022   Chronic kidney disease, stage 3b ([place]) 05/27/2022   Mixed hyperlipidemia 05/27/2022   Pressure injury of skin 05/27/2022   COVID-19 virus infection 05/26/2022   Hyponatremia    Acute left PCA stroke ([place]) 05/22/2022   Hypertensive emergency    Pseudoaneurysm ([place]) 05/14/2022   Spondylolisthesis at L4-L5 level 04/26/2022   Hyperlipidemia 11/25/2020   Essential hypertension 07/11/2020   Common bile duct dilation    Acute pancreatitis 04/01/2020   Anxiety    Hypokalemia    Mild renal insufficiency    Elevated troponin    Hypertensive urgency    Elevated liver enzymes  Abnormal magnetic resonance imaging of abdomen    Coronary artery disease involving native coronary artery of native heart without angina pectoris    Chest pain    Herpes zoster without complication 75/17/0017   Abdominal pain 02/07/2018   Abdominal pain, epigastric 02/07/2018   Low back pain 07/09/2013   Neck pain 07/09/2013   Headache 07/09/2013   Fibromyalgia 07/09/2013    ONSET DATE: 08/04/22  REFERRING DIAG: M54.16 (ICD-10-CM) - Radiculopathy, lumbar region   THERAPY DIAG:  No diagnosis found.  Rationale for Evaluation and Treatment: Rehabilitation  SUBJECTIVE:                                                                                                                                                                                              SUBJECTIVE STATEMENT: The patient is s/p low back surgery in July 2023.  She had a fall 1-2 weeks after surgery and notes she had a stroke. During her IP rehab stay, she stayed 34 days -- course was complicated by Covid diagnosis. She reports 17 falls prior to her back surgery, no falls since surgery. She is now using a RW for mobility. She walks short distances during the day without the RW.  She reports she is unable to stand for longer than 15 minutes. "I think the fall messed up my surgery." She reports she needs help with bathing, dressing, and all ADLs. She has visual changes that impact peripheral vision. PT inquired about OT and she does not want an OT referral. Pt accompanied by: self  PERTINENT HISTORY: HTN, CVA, Stage III kidney disease, lumbar laminectomy, h/o cervical laminectomy, RA, fibromyalgia, pancreatitis, h/o frequent falls and vertigo  PAIN:  Are you having pain? Yes: NPRS scale: 6/10 Pain location: achiness, generalized pain Pain description: constant Aggravating factors: constant pain Relieving factors: constant in nature  PRECAUTIONS: Fall  WEIGHT BEARING RESTRICTIONS: No  FALLS: Has patient fallen in last 6 months? Yes, none since 1 major fall after surgery (around time of CVA)  PATIENT GOALS: Getting more independent, walking without device.  Has not been able to drive x months before surgery.   Goal to improve strength and balance.  OBJECTIVE:      TODAY'S TREATMENT: 09/19/22 Activity Comments                         Below measures were taken at time of initial evaluation unless otherwise specified:   DIAGNOSTIC FINDINGS: 1. L4-5 PLIF with bilateral laminectomies. Mild left neural foraminal stenosis. 2. No acute osseous abnormality.  COGNITION: Overall cognitive status: Impaired and she reports  daughter and husband help with medication mgmt   SENSATION: Numbness and  tingling in her feet  COORDINATION: WNLs and equal bilaterally  MUSCLE TONE: WNLs  POSTURE: rounded shoulders and forward head  UPPER EXTREMITY ROM:  bilateral shoulder AROM to 90 degrees flexion and abduction  LOWER EXTREMITY ROM:   WFLs.  LOWER EXTREMITY MMT:    MMT Right Eval Left Eval  Hip flexion 3/5 3/5  Hip extension    Hip abduction    Hip adduction    Hip internal rotation    Hip external rotation    Knee flexion 4-/5 4-/5  Knee extension 4/5 4/5  Ankle dorsiflexion 3/5 3/5  Ankle plantarflexion    Ankle inversion    Ankle eversion    (Blank rows = not tested)  BED MOBILITY:  Gets in and out of bed independently  TRANSFERS: Assistive device utilized: Environmental consultant - 2 wheeled  Sit to stand: Modified independence Stand to sit: Modified independence  STAIRS: Level of Assistance: Min A Stair Negotiation Technique: Alternating Pattern  with Bilateral Rails Number of Stairs: 2  Height of Stairs: 6"  Comments: Reports needing help at home for stairs  GAIT: Gait pattern: decreased step length- Right, decreased step length- Left, decreased stride length, decreased hip/knee flexion- Right, decreased hip/knee flexion- Left, decreased ankle dorsiflexion- Right, decreased ankle dorsiflexion- Left, decreased trunk rotation, narrow BOS, and poor foot clearance- Left Distance walked: 100 Assistive device utilized: Walker - 2 wheeled Level of assistance: CGA Comments: frequent L foot drag during gait, responds well with cues for heel strike  FUNCTIONAL TESTS:  Berg=27/56 Gait speed= 1.04 ft/sec  BERG BALANCE TEST Sitting to Standing: 3.      Stands independently using hands Standing Unsupported: 4.      Stands safely for 2 minutes Sitting Unsupported: 4.     Sits for 2 minutes independently Standing to Sitting: 4.     Sits safely with minimal use of hands Transfers: 3.     Transfers safely definite use of hands Standing with eyes closed: 3.     Stands 10 seconds with  supervision Standing with feet together: 3.     Stands for 1 minute with supervision Reaching forward with outstretched arm: 1.     Reaches forward with supervision Retrieving object from the floor: 0.     Unable/needs assistance to keep from falling Turning to look behind: 1.     Needs supervision when turning Turning 360 degrees: 0.     Needs assistance Place alternate foot on stool: 0.     Unable, needs assist to keep from falling Standing with one foot in front: 1.     Needs help to step, but can hold for 15 seconds Standing on one foot: 0.     Unable  Total Score: 27/56  PATIENT SURVEYS:  N/a-- CVA, also is s/p back surgery  TODAY'S TREATMENT:  DATE: 09/13/22    Sit to stand x 5 reps  PATIENT EDUCATION: Education details: HEP Person educated: Patient Education method: Explanation, Demonstration, and Handouts Education comprehension: verbalized understanding and returned demonstration  HOME EXERCISE PROGRAM: Access Code: [email or web address] URL: [email or web address] Date: 09/13/2022 Prepared by: Rudell Cobb  Exercises - Sit to Stand with Armchair  - 2 x daily - 7 x weekly - 1 sets - 5 reps  GOALS: Goals reviewed with patient? Yes  SHORT TERM GOALS: Target date: 10/11/2022  The patient will be indep with HEP. Goal status: IN PROGRESS  2.  The patient will improve Berg balance score to > or equal to 33/56. Baseline: 27/56 Goal status: IN PROGRESS  3.  The patient will improve gait speed to > or equal to 1.5 ft/sec. Baseline: 1.04 ft/sec Goal status: IN PROGRESS  4.  The patient will ambulate mod indep with RW x 300 ft to demo improved household mobility. Baseline: walks short distances in home Goal status: IN PROGRESS  5.  The patient will negotiate 4 steps with supervision with one handrail to improve access to home  environment. Baseline: min A Goal status: IN PROGRESS  LONG TERM GOALS: Target date: 11/09/2023  The patient will be indep with HEP progression. Baseline:  no HEP Goal status: IN PROGRESS  2.  The patient will improve Berg score to > or equal to 38/56 to demo dec'ing risk for falls. Baseline: 27/56 Goal status: IN PROGRESS  3.  The patient will improve gait speed to > or equal to 2.0 ft/sec to demo dec'ing risk for falls and improved community access. Baseline: 1.04 ft/sec Goal status: IN PROGRESS  4.  The patient will negotiate household surfaces without a device mod indep. Baseline:  Uses RW Goal status: IN PROGRESS  5.  The patient will report low back pain < or equal to 3/10 during standing activities x 10 minutes. Baseline: 6/10 today Goal status: IN PROGRESS  ASSESSMENT:  CLINICAL IMPRESSION: Patient is a 71 y.o. female who was seen today for physical therapy evaluation and treatment for low back surgery and stroke in July 2023. The patient also notes COVID that complicated her recovery.  Patient presents with dec'd independence with all ADLs, IADLs, and household mobility since CVA.  She presents with impairments in muscle strength, endurance, balance, gait and ROM in [place].  PT to address deficits to promote improved safety and independence in the home.   OBJECTIVE IMPAIRMENTS: Abnormal gait, decreased activity tolerance, decreased balance, decreased endurance, decreased ROM, decreased strength, impaired UE functional use, and pain.   ACTIVITY LIMITATIONS: bending, standing, squatting, stairs, transfers, bathing, dressing, reach over head, and locomotion level  PARTICIPATION LIMITATIONS: meal prep, cleaning, laundry, medication management, interpersonal relationship, driving, and community activity  PERSONAL FACTORS: 3+ comorbidities: HTN, CVA, low back surgery  are also affecting patient's functional outcome.   REHAB POTENTIAL: Good  CLINICAL DECISION MAKING:  Stable/uncomplicated  EVALUATION COMPLEXITY: Low  PLAN:  PT FREQUENCY: 2x/week  PT DURATION: 8 weeks  PLANNED INTERVENTIONS: Therapeutic exercises, Therapeutic activity, Neuromuscular re-education, Balance training, Gait training, Patient/Family education, Self Care, Joint mobilization, Stair training, Visual/preceptual remediation/compensation, DME instructions, Aquatic Therapy, Dry Needling, Moist heat, Taping, Manual therapy, and Re-evaluation  PLAN FOR NEXT SESSION: Establish HEP (only 1 exercise today) slowly, work on gait safety and mechanics (longer stride), standing balance, LE strength, and low back/core stabilization.   Manuela Neptune, PT 09/18/2022, 8:02 AM

## 2022-09-19 ENCOUNTER — Ambulatory Visit: Payer: PPO | Admitting: Physical Therapy

## 2022-09-20 ENCOUNTER — Other Ambulatory Visit: Payer: PPO

## 2022-09-20 ENCOUNTER — Ambulatory Visit: Payer: PPO | Admitting: Nurse Practitioner

## 2022-09-21 ENCOUNTER — Telehealth: Payer: Self-pay

## 2022-09-21 ENCOUNTER — Inpatient Hospital Stay: Payer: PPO

## 2022-09-21 ENCOUNTER — Encounter: Payer: Self-pay | Admitting: Nurse Practitioner

## 2022-09-21 ENCOUNTER — Inpatient Hospital Stay: Payer: PPO | Admitting: Nurse Practitioner

## 2022-09-21 ENCOUNTER — Inpatient Hospital Stay: Payer: PPO | Attending: Nurse Practitioner

## 2022-09-21 ENCOUNTER — Inpatient Hospital Stay (HOSPITAL_BASED_OUTPATIENT_CLINIC_OR_DEPARTMENT_OTHER): Payer: PPO | Admitting: Nurse Practitioner

## 2022-09-21 VITALS — BP 168/68 | HR 65 | Temp 98.1°F | Resp 18 | Ht 63.0 in | Wt 143.0 lb

## 2022-09-21 DIAGNOSIS — I129 Hypertensive chronic kidney disease with stage 1 through stage 4 chronic kidney disease, or unspecified chronic kidney disease: Secondary | ICD-10-CM | POA: Diagnosis not present

## 2022-09-21 DIAGNOSIS — D649 Anemia, unspecified: Secondary | ICD-10-CM | POA: Diagnosis not present

## 2022-09-21 DIAGNOSIS — N189 Chronic kidney disease, unspecified: Secondary | ICD-10-CM

## 2022-09-21 DIAGNOSIS — D509 Iron deficiency anemia, unspecified: Secondary | ICD-10-CM

## 2022-09-21 DIAGNOSIS — D631 Anemia in chronic kidney disease: Secondary | ICD-10-CM

## 2022-09-21 DIAGNOSIS — Z8673 Personal history of transient ischemic attack (TIA), and cerebral infarction without residual deficits: Secondary | ICD-10-CM | POA: Diagnosis not present

## 2022-09-21 LAB — CBC WITH DIFFERENTIAL (CANCER CENTER ONLY)
Abs Immature Granulocytes: 0.02 10*3/uL (ref 0.00–0.07)
Basophils Absolute: 0.1 10*3/uL (ref 0.0–0.1)
Basophils Relative: 1 %
Eosinophils Absolute: 0.3 10*3/uL (ref 0.0–0.5)
Eosinophils Relative: 3 %
HCT: 31 % — ABNORMAL LOW (ref 36.0–46.0)
Hemoglobin: 9.9 g/dL — ABNORMAL LOW (ref 12.0–15.0)
Immature Granulocytes: 0 %
Lymphocytes Relative: 18 %
Lymphs Abs: 1.7 10*3/uL (ref 0.7–4.0)
MCH: 31.4 pg (ref 26.0–34.0)
MCHC: 31.9 g/dL (ref 30.0–36.0)
MCV: 98.4 fL (ref 80.0–100.0)
Monocytes Absolute: 0.8 10*3/uL (ref 0.1–1.0)
Monocytes Relative: 8 %
Neutro Abs: 6.7 10*3/uL (ref 1.7–7.7)
Neutrophils Relative %: 70 %
Platelet Count: 313 10*3/uL (ref 150–400)
RBC: 3.15 MIL/uL — ABNORMAL LOW (ref 3.87–5.11)
RDW: 13.1 % (ref 11.5–15.5)
WBC Count: 9.6 10*3/uL (ref 4.0–10.5)
nRBC: 0 % (ref 0.0–0.2)

## 2022-09-21 NOTE — Therapy (Signed)
OUTPATIENT PHYSICAL THERAPY NEURO TREATMENT   Patient Name: Robin Arellano MRN: [id number] DOB:07-31-1951, 71 y.o., female Today's Date: 09/24/2022   PCP: Tawnya Crook, MD REFERRING PROVIDER: Kristeen Miss, MD   PT End of Session - 09/24/22 1144     Visit Number 2    Number of Visits 17    Date for PT Re-Evaluation 11/12/22    Authorization Type [employer]    Progress Note Due on Visit 10    PT Start Time 1104    PT Stop Time 1144    PT Time Calculation (min) 40 min    Activity Tolerance Patient tolerated treatment well;Patient limited by pain    Behavior During Therapy [facility] for tasks assessed/performed              Past Medical History:  Diagnosis Date   Allergy    Anemia    Anxiety    on meds   Back pain    Blood transfusion without reported diagnosis    Cataract    Chronic female pelvic pain    Chronic kidney disease    Coronary artery disease    mild, non-obstructive 11/2018   Depression    on meds   Family history of adverse reaction to anesthesia    sister had difficulty waking up   Fibromyalgia    H/O leukocytosis    Headache    Heart murmur    Hyperlipidemia    on meds   Hypertension    on meds   MI (myocardial infarction) ([place])    Pt states she did not have a MI- EKG was normal, was GERD   Osteoarthritis    on meds   Ovarian cyst, right    PONV (postoperative nausea and vomiting)    Post-operative nausea and vomiting    Stroke ([place]) 05/14/2022   L PCA   SVD (spontaneous vaginal delivery)    x 2   Vitamin D deficiency    Past Surgical History:  Procedure Laterality Date   ABDOMINAL HYSTERECTOMY  1994   TAH.BSO   ANTERIOR CERVICAL DECOMP/DISCECTOMY FUSION  2019   APPENDECTOMY  1975   BACK SURGERY  2023   BIOPSY  05/12/2020   Procedure: BIOPSY;  Surgeon: Milus Banister, MD;  Location: [facility];  Service: Endoscopy;;   CARDIAC CATHETERIZATION  2020   CHOLECYSTECTOMY N/A 07/21/2020   Procedure: LAPAROSCOPIC  CHOLECYSTECTOMY WITH INTRAOPERATIVE CHOLANGIOGRAM;  Surgeon: [name], MD;  Location: [place];  Service: General;  Laterality: N/A;   COLONOSCOPY  08/12/2017   Hx TA (piecemeal)Jacobs-MAC-suprep (good)   ESOPHAGOGASTRODUODENOSCOPY (EGD) WITH PROPOFOL N/A 05/12/2020   Procedure: ESOPHAGOGASTRODUODENOSCOPY (EGD) WITH PROPOFOL;  Surgeon: Milus Banister, MD;  Location: [facility];  Service: Endoscopy;  Laterality: N/A;   EUS N/A 05/12/2020   Procedure: UPPER ENDOSCOPIC ULTRASOUND (EUS) RADIAL;  Surgeon: Milus Banister, MD;  Location: [facility];  Service: Endoscopy;  Laterality: N/A;   KNEE SURGERY Bilateral 1996   x 2 - arthroscopic   LEFT HEART CATH AND CORONARY ANGIOGRAPHY N/A 11/25/2018   Procedure: LEFT HEART CATH AND CORONARY ANGIOGRAPHY;  Surgeon: Troy Sine, MD;  Location: [place] [facility];  Service: Cardiovascular;  Laterality: N/A;   PELVIC LAPAROSCOPY  1989   W LYSIS OF ADHESIONS/L SALPINGONEOSTOMY   TUBAL LIGATION     WISDOM TOOTH EXTRACTION     Patient Active Problem List   Diagnosis Date Noted   Nausea & vomiting 06/01/2022   Chronic kidney disease, stage  3b ([place]) 05/27/2022   Mixed hyperlipidemia 05/27/2022   Pressure injury of skin 05/27/2022   COVID-19 virus infection 05/26/2022   Hyponatremia    Acute left PCA stroke ([place]) 05/22/2022   Hypertensive emergency    Pseudoaneurysm ([place]) 05/14/2022   Spondylolisthesis at L4-L5 level 04/26/2022   Hyperlipidemia 11/25/2020   Essential hypertension 07/11/2020   Common bile duct dilation    Acute pancreatitis 04/01/2020   Anxiety    Hypokalemia    Mild renal insufficiency    Elevated troponin    Hypertensive urgency    Elevated liver enzymes    Abnormal magnetic resonance imaging of abdomen    Coronary artery disease involving native coronary artery of native heart without angina pectoris    Chest pain    Herpes zoster without complication 93/26/7124   Abdominal pain 02/07/2018   Abdominal pain,  epigastric 02/07/2018   Low back pain 07/09/2013   Neck pain 07/09/2013   Headache 07/09/2013   Fibromyalgia 07/09/2013    ONSET DATE: 08/04/22  REFERRING DIAG: M54.16 (ICD-10-CM) - Radiculopathy, lumbar region   THERAPY DIAG:  Other abnormalities of gait and mobility  Muscle weakness (generalized)  Unsteadiness on feet  Other low back pain  Rationale for Evaluation and Treatment: Rehabilitation  SUBJECTIVE:                                                                                                                                                                                             SUBJECTIVE STATEMENT: Back is really hurting today. Reports that her HR was 55 this AM. Did not come last session d/t LBP. Reports that she did too many of the STS exercise.  Pt accompanied by: self  PERTINENT HISTORY: HTN, CVA, Stage III kidney disease, lumbar laminectomy, h/o cervical laminectomy, RA, fibromyalgia, pancreatitis, h/o frequent falls and vertigo  PAIN:  Are you having pain? Yes: NPRS scale: 6/10 Pain location: R>L LB Pain description: constant Aggravating factors: constant pain Relieving factors: constant in nature  PRECAUTIONS: Fall  WEIGHT BEARING RESTRICTIONS: No  FALLS: Has patient fallen in last 6 months? Yes, none since 1 major fall after surgery (around time of CVA)  PATIENT GOALS: Getting more independent, walking without device.  Has not been able to drive x months before surgery.   Goal to improve strength and balance.  OBJECTIVE:      TODAY'S TREATMENT: 09/24/22 Activity Comments  Nutstep L3 x 6 min Ues/Les  Maintaining ~40 SPM  STS 5x using B armrests, 5x pushing off knees  Good tall posture; c/o L knee pain without Ues however agreeable to continue  palpation Mild edema over  L medial and lateral aspects of L knee  LAQ 10x each LE  Cueing to achieve TKE  Standing march 2x20 Cueing for tall posture   mini squat 2x10  Cueing to shift wt  posteriorly  HS curl 2#  2 sets 10x each LE C/o difficulty   Sitting clam red TB 2x10 Limited amplitude   Intermittent sitting rest breaks with MHP to LB in between standing activities      [place] Last updated: 09/25/22 Access Code: [email or web address] URL: [email or web address] Date: 09/24/2022 Prepared by: [facility]  Exercises - Sit to Stand with Counter Support  - 1 x daily - 5 x weekly - 2 sets - 10 reps - Seated Long Arc Quad  - 1 x daily - 5 x weekly - 2 sets - 10 reps - Ice  - 1 x daily - 5 x weekly   PATIENT EDUCATION: Education details: edu on ice and elevation of L knee 10 min after sessions; HEP update Person educated: Patient Education method: Explanation, Demonstration, Tactile cues, Verbal cues, and Handouts Education comprehension: verbalized understanding and returned demonstration   Below measures were taken at time of initial evaluation unless otherwise specified:   DIAGNOSTIC FINDINGS: 1. L4-5 PLIF with bilateral laminectomies. Mild left neural foraminal stenosis. 2. No acute osseous abnormality.  COGNITION: Overall cognitive status: Impaired and she reports daughter and husband help with medication mgmt   SENSATION: Numbness and tingling in her feet  COORDINATION: WNLs and equal bilaterally  MUSCLE TONE: WNLs  POSTURE: rounded shoulders and forward head  UPPER EXTREMITY ROM:  bilateral shoulder AROM to 90 degrees flexion and abduction  LOWER EXTREMITY ROM:   WFLs.  LOWER EXTREMITY MMT:    MMT Right Eval Left Eval  Hip flexion 3/5 3/5  Hip extension    Hip abduction    Hip adduction    Hip internal rotation    Hip external rotation    Knee flexion 4-/5 4-/5  Knee extension 4/5 4/5  Ankle dorsiflexion 3/5 3/5  Ankle plantarflexion    Ankle inversion    Ankle eversion    (Blank rows = not tested)  BED MOBILITY:  Gets in and out of bed independently  TRANSFERS: Assistive  device utilized: Environmental consultant - 2 wheeled  Sit to stand: Modified independence Stand to sit: Modified independence  STAIRS: Level of Assistance: Min A Stair Negotiation Technique: Alternating Pattern  with Bilateral Rails Number of Stairs: 2  Height of Stairs: 6"  Comments: Reports needing help at home for stairs  GAIT: Gait pattern: decreased step length- Right, decreased step length- Left, decreased stride length, decreased hip/knee flexion- Right, decreased hip/knee flexion- Left, decreased ankle dorsiflexion- Right, decreased ankle dorsiflexion- Left, decreased trunk rotation, narrow BOS, and poor foot clearance- Left Distance walked: 100 Assistive device utilized: Walker - 2 wheeled Level of assistance: CGA Comments: frequent L foot drag during gait, responds well with cues for heel strike  FUNCTIONAL TESTS:  Berg=27/56 Gait speed= 1.04 ft/sec  BERG BALANCE TEST Sitting to Standing: 3.      Stands independently using hands Standing Unsupported: 4.      Stands safely for 2 minutes Sitting Unsupported: 4.     Sits for 2 minutes independently Standing to Sitting: 4.     Sits safely with minimal use of hands Transfers: 3.     Transfers safely definite use of hands Standing with eyes closed: 3.     Stands 10 seconds with  supervision Standing with feet together: 3.     Stands for 1 minute with supervision Reaching forward with outstretched arm: 1.     Reaches forward with supervision Retrieving object from the floor: 0.     Unable/needs assistance to keep from falling Turning to look behind: 1.     Needs supervision when turning Turning 360 degrees: 0.     Needs assistance Place alternate foot on stool: 0.     Unable, needs assist to keep from falling Standing with one foot in front: 1.     Needs help to step, but can hold for 15 seconds Standing on one foot: 0.     Unable  Total Score: 27/56  PATIENT SURVEYS:  N/a-- CVA, also is s/p back surgery  TODAY'S TREATMENT:                                                                                                                               DATE: 09/13/22    Sit to stand x 5 reps  PATIENT EDUCATION: Education details: HEP Person educated: Patient Education method: Explanation, Demonstration, and Handouts Education comprehension: verbalized understanding and returned demonstration  HOME EXERCISE PROGRAM: Access Code: [email or web address] URL: [email or web address] Date: 09/13/2022 Prepared by: Rudell Cobb  Exercises - Sit to Stand with Armchair  - 2 x daily - 7 x weekly - 1 sets - 5 reps  GOALS: Goals reviewed with patient? Yes  SHORT TERM GOALS: Target date: 10/11/2022  The patient will be indep with HEP. Goal status: IN PROGRESS  2.  The patient will improve Berg balance score to > or equal to 33/56. Baseline: 27/56 Goal status: IN PROGRESS  3.  The patient will improve gait speed to > or equal to 1.5 ft/sec. Baseline: 1.04 ft/sec Goal status: IN PROGRESS  4.  The patient will ambulate mod indep with RW x 300 ft to demo improved household mobility. Baseline: walks short distances in home Goal status: IN PROGRESS  5.  The patient will negotiate 4 steps with supervision with one handrail to improve access to home environment. Baseline: min A Goal status: IN PROGRESS  LONG TERM GOALS: Target date: 11/09/2023  The patient will be indep with HEP progression. Baseline:  no HEP Goal status: IN PROGRESS  2.  The patient will improve Berg score to > or equal to 38/56 to demo dec'ing risk for falls. Baseline: 27/56 Goal status: IN PROGRESS  3.  The patient will improve gait speed to > or equal to 2.0 ft/sec to demo dec'ing risk for falls and improved community access. Baseline: 1.04 ft/sec Goal status: IN PROGRESS  4.  The patient will negotiate household surfaces without a device mod indep. Baseline:  Uses RW Goal status: IN PROGRESS  5.  The patient will report low back pain < or  equal to 3/10 during standing activities x 10 minutes. Baseline: 6/10 today Goal status: IN PROGRESS  ASSESSMENT:  CLINICAL IMPRESSION: Patient  arrived to session with report of continued LBP. Reviewed HEP and provided challenge by reducing reliance on UEs. Patient reported L knee pain with this adjustment but was agreeable to continue. L knee slightly edematous upon observation, thus educated on ice application. Standing LE strengthening activities were completed with intermittent sitting rest breaks with MHP on LB to address c/o LBP. Overall, patient moves slowly and cautiously and reported main with most activities, however tolerated session well despite this. No increased pain at end of session.   OBJECTIVE IMPAIRMENTS: Abnormal gait, decreased activity tolerance, decreased balance, decreased endurance, decreased ROM, decreased strength, impaired UE functional use, and pain.   ACTIVITY LIMITATIONS: bending, standing, squatting, stairs, transfers, bathing, dressing, reach over head, and locomotion level  PARTICIPATION LIMITATIONS: meal prep, cleaning, laundry, medication management, interpersonal relationship, driving, and community activity  PERSONAL FACTORS: 3+ comorbidities: HTN, CVA, low back surgery  are also affecting patient's functional outcome.   REHAB POTENTIAL: Good  CLINICAL DECISION MAKING: Stable/uncomplicated  EVALUATION COMPLEXITY: Low  PLAN:  PT FREQUENCY: 2x/week  PT DURATION: 8 weeks  PLANNED INTERVENTIONS: Therapeutic exercises, Therapeutic activity, Neuromuscular re-education, Balance training, Gait training, Patient/Family education, Self Care, Joint mobilization, Stair training, Visual/preceptual remediation/compensation, DME instructions, Aquatic Therapy, Dry Needling, Moist heat, Taping, Manual therapy, and Re-evaluation  PLAN FOR NEXT SESSION: Establish HEP (only 1 exercise today) slowly, work on gait safety and mechanics (longer stride), standing balance,  LE strength, and low back/core stabilization.  Janene Harvey, PT, DPT 09/24/22 11:46 AM  [facility] at [facility] [address] [place], [place], [place] [address] Phone # [phone] Fax # [phone]

## 2022-09-21 NOTE — Progress Notes (Signed)
  [place] OFFICE PROGRESS NOTE   Diagnosis: Anemia  INTERVAL HISTORY:   Robin Arellano returns as scheduled.  Energy level is slightly proved.  She is participating in physical therapy.  She denies bleeding.  Objective:  Vital signs in last 24 hours:  Blood pressure (!) 168/68, pulse 65, temperature 98.1 F (36.7 C), temperature source Oral, resp. rate 18, height '5\' 3"'$  (1.6 m), weight 143 lb (64.9 kg), SpO2 98 %.    Resp: Lungs clear bilaterally. Cardio: Regular rate and rhythm. GI: Abdomen soft and nontender.  No hepatosplenomegaly. Vascular: No leg edema.   Lab Results:  Lab Results  Component Value Date   WBC 9.6 09/21/2022   HGB 9.9 (L) 09/21/2022   HCT 31.0 (L) 09/21/2022   MCV 98.4 09/21/2022   PLT 313 09/21/2022   NEUTROABS 6.7 09/21/2022    Imaging:  No results found.  Medications: I have reviewed the patient's current medications.  Assessment/Plan: Anemia 09/06/2022 SPEP/IFE-no M spike, polyclonal increase in 1 or more immunoglobulins, IgA mildly elevated, IgG and IgM normal; serum light chains kappa and lambda both elevated with normal ratio; erythropoietin mildly elevated; B12 normal; LDH normal; creatinine elevated at 1.67 Chronic kidney disease Hypertension History of stroke History of pancreatitis  Disposition: Robin Arellano appears stable.  We reviewed the lab data obtained at her 09/06/2022 appointment.  We discussed the erythropoietin level being inappropriately low.  She and her sister understand the anemia is likely due to chronic renal failure.  We discussed erythropoietin injections, potential side effects.  The CBC from today shows a hemoglobin of 9.9.  The plan is for continued observation and to consider erythropoietin injections if the hemoglobin falls.  She is in agreement.  She will return for lab and follow-up in approximately 2 months.  We are available to see her sooner if needed.  She understands to contact the office  with signs/symptoms suggestive of progressive anemia.    Robin Arellano ANP/GNP-BC   09/21/2022  11:58 AM

## 2022-09-21 NOTE — Telephone Encounter (Signed)
I requested the last note from [place]. Per lisa she will fax the note over.

## 2022-09-24 ENCOUNTER — Encounter: Payer: Self-pay | Admitting: Physical Therapy

## 2022-09-24 ENCOUNTER — Ambulatory Visit: Payer: PPO | Admitting: Physical Therapy

## 2022-09-24 DIAGNOSIS — R2689 Other abnormalities of gait and mobility: Secondary | ICD-10-CM

## 2022-09-24 DIAGNOSIS — R2681 Unsteadiness on feet: Secondary | ICD-10-CM

## 2022-09-24 DIAGNOSIS — M6281 Muscle weakness (generalized): Secondary | ICD-10-CM

## 2022-09-24 DIAGNOSIS — M5459 Other low back pain: Secondary | ICD-10-CM

## 2022-09-26 NOTE — Therapy (Signed)
OUTPATIENT PHYSICAL THERAPY NEURO TREATMENT   Patient Name: Robin Arellano MRN: [id number] DOB:04-02-1951, 71 y.o., female Today's Date: 09/27/2022   PCP: Tawnya Crook, MD REFERRING PROVIDER: Kristeen Miss, MD   PT End of Session - 09/27/22 1143     Visit Number 3    Number of Visits 17    Date for PT Re-Evaluation 11/12/22    Authorization Type [employer]    Progress Note Due on Visit 10    PT Start Time 1102    PT Stop Time 1144    PT Time Calculation (min) 42 min    Equipment Utilized During Treatment Gait belt    Activity Tolerance Patient tolerated treatment well;Patient limited by pain    Behavior During Therapy WFL for tasks assessed/performed               Past Medical History:  Diagnosis Date   Allergy    Anemia    Anxiety    on meds   Back pain    Blood transfusion without reported diagnosis    Cataract    Chronic female pelvic pain    Chronic kidney disease    Coronary artery disease    mild, non-obstructive 11/2018   Depression    on meds   Family history of adverse reaction to anesthesia    sister had difficulty waking up   Fibromyalgia    H/O leukocytosis    Headache    Heart murmur    Hyperlipidemia    on meds   Hypertension    on meds   MI (myocardial infarction) ([place])    Pt states she did not have a MI- EKG was normal, was GERD   Osteoarthritis    on meds   Ovarian cyst, right    PONV (postoperative nausea and vomiting)    Post-operative nausea and vomiting    Stroke ([place]) 05/14/2022   L PCA   SVD (spontaneous vaginal delivery)    x 2   Vitamin D deficiency    Past Surgical History:  Procedure Laterality Date   ABDOMINAL HYSTERECTOMY  1994   TAH.BSO   ANTERIOR CERVICAL DECOMP/DISCECTOMY FUSION  2019   APPENDECTOMY  1975   BACK SURGERY  2023   BIOPSY  05/12/2020   Procedure: BIOPSY;  Surgeon: Milus Banister, MD;  [facility];  Service: Endoscopy;;   CARDIAC CATHETERIZATION  2020    CHOLECYSTECTOMY N/A 07/21/2020   Procedure: LAPAROSCOPIC CHOLECYSTECTOMY WITH INTRAOPERATIVE CHOLANGIOGRAM;  Surgeon: [name], MD;  Location: [facility];  Service: General;  Laterality: N/A;   COLONOSCOPY  08/12/2017   Hx TA (piecemeal)Jacobs-MAC-suprep (good)   ESOPHAGOGASTRODUODENOSCOPY (EGD) WITH PROPOFOL N/A 05/12/2020   Procedure: ESOPHAGOGASTRODUODENOSCOPY (EGD) WITH PROPOFOL;  Surgeon: Milus Banister, MD;  [facility];  Service: Endoscopy;  Laterality: N/A;   EUS N/A 05/12/2020   Procedure: UPPER ENDOSCOPIC ULTRASOUND (EUS) RADIAL;  Surgeon: Milus Banister, MD;  [facility];  Service: Endoscopy;  Laterality: N/A;   KNEE SURGERY Bilateral 1996   x 2 - arthroscopic   LEFT HEART CATH AND CORONARY ANGIOGRAPHY N/A 11/25/2018   Procedure: LEFT HEART CATH AND CORONARY ANGIOGRAPHY;  Surgeon: Troy Sine, MD;  Location: [facility];  Service: Cardiovascular;  Laterality: N/A;   PELVIC LAPAROSCOPY  1989   W LYSIS OF ADHESIONS/L SALPINGONEOSTOMY   TUBAL LIGATION     WISDOM TOOTH EXTRACTION     Patient Active Problem List   Diagnosis Date Noted  Nausea & vomiting 06/01/2022   Chronic kidney disease, stage 3b ([place]) 05/27/2022   Mixed hyperlipidemia 05/27/2022   Pressure injury of skin 05/27/2022   COVID-19 virus infection 05/26/2022   Hyponatremia    Acute left PCA stroke ([place]) 05/22/2022   Hypertensive emergency    Pseudoaneurysm ([place]) 05/14/2022   Spondylolisthesis at L4-L5 level 04/26/2022   Hyperlipidemia 11/25/2020   Essential hypertension 07/11/2020   Common bile duct dilation    Acute pancreatitis 04/01/2020   Anxiety    Hypokalemia    Mild renal insufficiency    Elevated troponin    Hypertensive urgency    Elevated liver enzymes    Abnormal magnetic resonance imaging of abdomen    Coronary artery disease involving native coronary artery of native heart without angina pectoris    Chest pain    Herpes zoster without complication  47/82/9562   Abdominal pain 02/07/2018   Abdominal pain, epigastric 02/07/2018   Low back pain 07/09/2013   Neck pain 07/09/2013   Headache 07/09/2013   Fibromyalgia 07/09/2013    ONSET DATE: 08/04/22  REFERRING DIAG: M54.16 (ICD-10-CM) - Radiculopathy, lumbar region   THERAPY DIAG:  Other abnormalities of gait and mobility  Muscle weakness (generalized)  Unsteadiness on feet  Other low back pain  Rationale for Evaluation and Treatment: Rehabilitation  SUBJECTIVE:                                                                                                                                                                                             SUBJECTIVE STATEMENT: "I think the weights last time were too much because of my back." Getting a heart monitor tomorrow.  Pt accompanied by: self  PERTINENT HISTORY: HTN, CVA, Stage III kidney disease, lumbar laminectomy, h/o cervical laminectomy, RA, fibromyalgia, pancreatitis, h/o frequent falls and vertigo  PAIN:  Are you having pain? Yes: NPRS scale: 5/10 Pain location: R>L LB Pain description: constant Aggravating factors: constant pain Relieving factors: constant in nature  PRECAUTIONS: Fall  WEIGHT BEARING RESTRICTIONS: No  FALLS: Has patient fallen in last 6 months? Yes, none since 1 major fall after surgery (around time of CVA)  PATIENT GOALS: Getting more independent, walking without device.  Has not been able to drive x months before surgery.   Goal to improve strength and balance.  OBJECTIVE:     TODAY'S TREATMENT: 09/27/22 Activity Comments  Nustep L3 x 7 min Ues/Les  Maintaining ~ 45 SPM  STS- pushing off knees 10x, with red medball at chest 10x From 20 in chair; good tolerance   Romberg 2x30"  Mild-mod sway  standing wide EC 30" Good  stability  Romberg EC x30"  Good stability  alt forward step Initially holding on with 1 hand, weaning to no UE support; CGA  alt sidestep Initially holding on with 2  hands, weaning to no UE support; CGA  alt backward step 1 UE support; cueing for larger steps   Backwards walking  2x4f 1 episode of LOB requiring mod A- c/o L SIJ pain after LOB  Sidestepping [id number] episode of LOB requiring mod A  Sitting pelvic tilts 10x Reports "that feels good to my back"  Sitting fig 4 stretch 30" each Advised to avoid pushing into pain   Sitting KTOS stretch 30" each Report of relief of LBP    HOME EXERCISE PROGRAM Last updated: 09/25/22 Access Code: [email or web address] URL: [email or web address] Date: 09/24/2022 [facility]: [facility]  Exercises - Sit to Stand with Counter Support  - 1 x daily - 5 x weekly - 2 sets - 10 reps - Seated Long Arc Quad  - 1 x daily - 5 x weekly - 2 sets - 10 reps - Ice  - 1 x daily - 5 x weekly   Below measures were taken at time of initial evaluation unless otherwise specified:   DIAGNOSTIC FINDINGS: 1. L4-5 PLIF with bilateral laminectomies. Mild left neural foraminal stenosis. 2. No acute osseous abnormality.  COGNITION: Overall cognitive status: Impaired and she reports daughter and husband help with medication mgmt   SENSATION: Numbness and tingling in her feet  COORDINATION: WNLs and equal bilaterally  MUSCLE TONE: WNLs  POSTURE: rounded shoulders and forward head  UPPER EXTREMITY ROM:  bilateral shoulder AROM to 90 degrees flexion and abduction  LOWER EXTREMITY ROM:   WFLs.  LOWER EXTREMITY MMT:    MMT Right Eval Left Eval  Hip flexion 3/5 3/5  Hip extension    Hip abduction    Hip adduction    Hip internal rotation    Hip external rotation    Knee flexion 4-/5 4-/5  Knee extension 4/5 4/5  Ankle dorsiflexion 3/5 3/5  Ankle plantarflexion    Ankle inversion    Ankle eversion    (Blank rows = not tested)  BED MOBILITY:  Gets in and out of bed independently  TRANSFERS: Assistive device utilized: WaEnvironmental consultant 2 wheeled  Sit to stand: Modified  independence Stand to sit: Modified independence  STAIRS: Level of Assistance: Min A Stair Negotiation Technique: Alternating Pattern  with Bilateral Rails Number of Stairs: 2  Height of Stairs: 6"  Comments: Reports needing help at home for stairs  GAIT: Gait pattern: decreased step length- Right, decreased step length- Left, decreased stride length, decreased hip/knee flexion- Right, decreased hip/knee flexion- Left, decreased ankle dorsiflexion- Right, decreased ankle dorsiflexion- Left, decreased trunk rotation, narrow BOS, and poor foot clearance- Left Distance walked: 100 Assistive device utilized: Walker - 2 wheeled Level of assistance: CGA Comments: frequent L foot drag during gait, responds well with cues for heel strike  FUNCTIONAL TESTS:  Berg=27/56 Gait speed= 1.04 ft/sec  BERG BALANCE TEST Sitting to Standing: 3.      Stands independently using hands Standing Unsupported: 4.      Stands safely for 2 minutes Sitting Unsupported: 4.     Sits for 2 minutes independently Standing to Sitting: 4.     Sits safely with minimal use of hands Transfers: 3.     Transfers safely definite use of hands Standing with eyes closed: 3.     Stands 10  seconds with supervision Standing with feet together: 3.     Stands for 1 minute with supervision Reaching forward with outstretched arm: 1.     Reaches forward with supervision Retrieving object from the floor: 0.     Unable/needs assistance to keep from falling Turning to look behind: 1.     Needs supervision when turning Turning 360 degrees: 0.     Needs assistance Place alternate foot on stool: 0.     Unable, needs assist to keep from falling Standing with one foot in front: 1.     Needs help to step, but can hold for 15 seconds Standing on one foot: 0.     Unable  Total Score: 27/56  PATIENT SURVEYS:  N/a-- CVA, also is s/p back surgery  TODAY'S TREATMENT:                                                                                                                               DATE: 09/13/22    Sit to stand x 5 reps  PATIENT EDUCATION: Education details: HEP Person educated: Patient Education method: Explanation, Demonstration, and Handouts Education comprehension: verbalized understanding and returned demonstration  HOME EXERCISE PROGRAM: Access Code: [email or web address] URL: [email or web address] Date: 09/13/2022 Prepared by: Rudell Cobb  Exercises - Sit to Stand with Armchair  - 2 x daily - 7 x weekly - 1 sets - 5 reps  GOALS: Goals reviewed with patient? Yes  SHORT TERM GOALS: Target date: 10/11/2022  The patient will be indep with HEP. Goal status: IN PROGRESS  2.  The patient will improve Berg balance score to > or equal to 33/56. Baseline: 27/56 Goal status: IN PROGRESS  3.  The patient will improve gait speed to > or equal to 1.5 ft/sec. Baseline: 1.04 ft/sec Goal status: IN PROGRESS  4.  The patient will ambulate mod indep with RW x 300 ft to demo improved household mobility. Baseline: walks short distances in home Goal status: IN PROGRESS  5.  The patient will negotiate 4 steps with supervision with one handrail to improve access to home environment. Baseline: min A Goal status: IN PROGRESS  LONG TERM GOALS: Target date: 11/09/2023  The patient will be indep with HEP progression. Baseline:  no HEP Goal status: IN PROGRESS  2.  The patient will improve Berg score to > or equal to 38/56 to demo dec'ing risk for falls. Baseline: 27/56 Goal status: IN PROGRESS  3.  The patient will improve gait speed to > or equal to 2.0 ft/sec to demo dec'ing risk for falls and improved community access. Baseline: 1.04 ft/sec Goal status: IN PROGRESS  4.  The patient will negotiate household surfaces without a device mod indep. Baseline:  Uses RW Goal status: IN PROGRESS  5.  The patient will report low back pain < or equal to 3/10 during standing activities x 10 minutes. Baseline:  6/10 today Goal status: IN PROGRESS  ASSESSMENT:  CLINICAL  IMPRESSION: Patient arrived to session with report of increased LBP after last session, requesting to decrease weight training today. Continued working on STS transfers with decreased UE reliance from elevated seat- able to tolerated light weight today. Static balance activities incorporated narrow stance and EC with good stability. Some hesitancy evident with stepping activities, requiring cues to increase step length. Patient demonstrated LOB with sidestepping and backwards walking, requiring assist to correct. Noted L SIJ pain after this misstep, which was addressed with gentle stretching. Patient reported much better tolerance for today's session compared to previous visit. .   OBJECTIVE IMPAIRMENTS: Abnormal gait, decreased activity tolerance, decreased balance, decreased endurance, decreased ROM, decreased strength, impaired UE functional use, and pain.   ACTIVITY LIMITATIONS: bending, standing, squatting, stairs, transfers, bathing, dressing, reach over head, and locomotion level  PARTICIPATION LIMITATIONS: meal prep, cleaning, laundry, medication management, interpersonal relationship, driving, and community activity  PERSONAL FACTORS: 3+ comorbidities: HTN, CVA, low back surgery  are also affecting patient's functional outcome.   REHAB POTENTIAL: Good  CLINICAL DECISION MAKING: Stable/uncomplicated  EVALUATION COMPLEXITY: Low  PLAN:  PT FREQUENCY: 2x/week  PT DURATION: 8 weeks  PLANNED INTERVENTIONS: Therapeutic exercises, Therapeutic activity, Neuromuscular re-education, Balance training, Gait training, Patient/Family education, Self Care, Joint mobilization, Stair training, Visual/preceptual remediation/compensation, DME instructions, Aquatic Therapy, Dry Needling, Moist heat, Taping, Manual therapy, and Re-evaluation  PLAN FOR NEXT SESSION: Establish HEP (only 1 exercise today) slowly, work on gait safety and  mechanics (longer stride), standing balance, LE strength, and low back/core stabilization.  Janene Harvey, PT, DPT 09/27/22 11:47 AM  [facility] at [facility] [address] [place], [place], [place] [address] Phone # [phone] Fax # [phone]

## 2022-09-27 ENCOUNTER — Encounter: Payer: Self-pay | Admitting: Physical Therapy

## 2022-09-27 ENCOUNTER — Ambulatory Visit: Payer: PPO | Admitting: Physical Therapy

## 2022-09-27 DIAGNOSIS — R2689 Other abnormalities of gait and mobility: Secondary | ICD-10-CM

## 2022-09-27 DIAGNOSIS — M5459 Other low back pain: Secondary | ICD-10-CM

## 2022-09-27 DIAGNOSIS — M6281 Muscle weakness (generalized): Secondary | ICD-10-CM

## 2022-09-27 DIAGNOSIS — R2681 Unsteadiness on feet: Secondary | ICD-10-CM

## 2022-09-28 ENCOUNTER — Telehealth: Payer: Self-pay

## 2022-09-28 ENCOUNTER — Ambulatory Visit: Payer: PPO | Attending: Internal Medicine | Admitting: Internal Medicine

## 2022-09-28 ENCOUNTER — Ambulatory Visit: Payer: PPO | Attending: Cardiology

## 2022-09-28 VITALS — BP 144/70 | HR 50 | Resp 20 | Wt 141.0 lb

## 2022-09-28 DIAGNOSIS — I639 Cerebral infarction, unspecified: Secondary | ICD-10-CM | POA: Diagnosis not present

## 2022-09-28 DIAGNOSIS — I2510 Atherosclerotic heart disease of native coronary artery without angina pectoris: Secondary | ICD-10-CM

## 2022-09-28 DIAGNOSIS — E785 Hyperlipidemia, unspecified: Secondary | ICD-10-CM

## 2022-09-28 DIAGNOSIS — Z79899 Other long term (current) drug therapy: Secondary | ICD-10-CM

## 2022-09-28 DIAGNOSIS — E782 Mixed hyperlipidemia: Secondary | ICD-10-CM

## 2022-09-28 DIAGNOSIS — I10 Essential (primary) hypertension: Secondary | ICD-10-CM | POA: Diagnosis not present

## 2022-09-28 NOTE — Progress Notes (Signed)
HPI Robin Arellano returns today to consider ILR insertion. She is a pleasant 71 yo man with a h/o cryptogenic stroke. She is referred by Dr. Leonie Man to consider ILR insertion. She has HTN. She has some residual difficulty with speech since her stroke.  Allergies  Allergen Reactions   Flexeril [Cyclobenzaprine] Anaphylaxis, Hives, Itching and Swelling   Lidocaine Hives   Zanaflex [Tizanidine] Anaphylaxis, Hives, Itching and Swelling   Neurontin [Gabapentin] Itching and Swelling   Latex Rash     Current Outpatient Medications  Medication Sig Dispense Refill   albuterol (VENTOLIN HFA) 108 (90 Base) MCG/ACT inhaler Inhale 2 puffs into the lungs every 4 (four) hours as needed for wheezing or shortness of breath. 8 g 0   ALPRAZolam (XANAX) 0.5 MG tablet TAKE 1 TABLET(0.5 MG) BY MOUTH TWICE DAILY AS NEEDED FOR SLEEP OR ANXIETY 30 tablet 3   amLODipine (NORVASC) 10 MG tablet TAKE 1 TABLET(10 MG) BY MOUTH DAILY 90 tablet 3   Ascorbic Acid (VITAMIN C) 1000 MG tablet Take 1,000 mg by mouth in the morning.     aspirin EC 81 MG tablet Take 1 tablet (81 mg total) by mouth daily. Swallow whole. 90 tablet 3   butalbital-acetaminophen-caffeine (FIORICET) 50-325-40 MG tablet TAKE 1 TABLET BY MOUTH EVERY 6 HOURS AS NEEDED FOR HEADACHE OR MIGRAINE 14 tablet 1   Calcium Carbonate Antacid (TUMS PO) Take 1 tablet by mouth daily as needed (stomach pain).     Cholecalciferol (VITAMIN D) 50 MCG (2000 UT) tablet Take 2,000 Units by mouth in the morning.     cloNIDine (CATAPRES) 0.1 MG tablet TAKE 1 TABLET(0.1 MG) BY MOUTH TWICE DAILY 90 tablet 3   ezetimibe (ZETIA) 10 MG tablet TAKE 1 TABLET(10 MG) BY MOUTH DAILY 90 tablet 3   hydrALAZINE (APRESOLINE) 100 MG tablet Take 1 tablet (100 mg total) by mouth 3 (three) times daily. 270 tablet 1   HYDROcodone-acetaminophen (NORCO/VICODIN) 5-325 MG tablet take 1-2 tablets every 8 hours as needed for pain     methocarbamol (ROBAXIN) 500 MG tablet Take 1 tablet (500 mg  total) by mouth every 6 (six) hours as needed for muscle spasms. 30 tablet 3   nystatin (MYCOSTATIN) 100000 UNIT/ML suspension Take 5 mLs (500,000 Units total) by mouth 4 (four) times daily. 60 mL 0   nystatin cream (MYCOSTATIN) Apply 1 Application topically 2 (two) times daily. 30 g 1   ondansetron (ZOFRAN) 4 MG tablet Take 1 tablet (4 mg total) by mouth every 4 (four) hours as needed for nausea or vomiting. 60 tablet 3   pantoprazole (PROTONIX) 40 MG tablet TAKE 1 TABLET(40 MG) BY MOUTH DAILY 90 tablet 3   polyethylene glycol powder (GLYCOLAX/MIRALAX) 17 GM/SCOOP powder Take 17 g by mouth daily as needed. 238 g 0   Potassium Chloride ER 20 MEQ TBCR TAKE 1 TABLET BY MOUTH IN THE MORNING AND AT BEDTIME 180 tablet 0   pravastatin (PRAVACHOL) 40 MG tablet Take 1 tablet (40 mg total) by mouth every evening. 90 tablet 2   spironolactone (ALDACTONE) 25 MG tablet Take 25 mg by mouth daily.     UNABLE TO FIND Med Name: Swiss Kriss     valACYclovir (VALTREX) 500 MG tablet TAKE 1 TABLET BY MOUTH TWICE DAILY FOR 3 TO 5 DAYS THEN TAKE DAILY AS NEEDED 30 tablet 0   cephALEXin (KEFLEX) 500 MG capsule Take 1 capsule (500 mg total) by mouth 4 (four) times daily. 14 capsule 0   No  current facility-administered medications for this visit.     Past Medical History:  Diagnosis Date   Allergy    Anemia    Anxiety    on meds   Back pain    Blood transfusion without reported diagnosis    Cataract    Chronic female pelvic pain    Chronic kidney disease    Coronary artery disease    mild, non-obstructive 11/2018   Depression    on meds   Family history of adverse reaction to anesthesia    sister had difficulty waking up   Fibromyalgia    H/O leukocytosis    Headache    Heart murmur    Hyperlipidemia    on meds   Hypertension    on meds   MI (myocardial infarction) ([place])    Pt states she did not have a MI- EKG was normal, was GERD   Osteoarthritis    on meds   Ovarian cyst, right    PONV  (postoperative nausea and vomiting)    Post-operative nausea and vomiting    Stroke ([place]) 05/14/2022   L PCA   SVD (spontaneous vaginal delivery)    x 2   Vitamin D deficiency     ROS:   All systems reviewed and negative except as noted in the HPI.   Past Surgical History:  Procedure Laterality Date   ABDOMINAL HYSTERECTOMY  1994   TAH.BSO   ANTERIOR CERVICAL DECOMP/DISCECTOMY FUSION  2019   APPENDECTOMY  1975   BACK SURGERY  2023   BIOPSY  05/12/2020   Procedure: BIOPSY;  Surgeon: Milus Banister, MD;  [facility];  Service: Endoscopy;;   CARDIAC CATHETERIZATION  2020   CHOLECYSTECTOMY N/A 07/21/2020   Procedure: LAPAROSCOPIC CHOLECYSTECTOMY WITH INTRAOPERATIVE CHOLANGIOGRAM;  Surgeon: [name], MD;  Location: [facility];  Service: General;  Laterality: N/A;   COLONOSCOPY  08/12/2017   Hx TA (piecemeal)Jacobs-MAC-suprep (good)   ESOPHAGOGASTRODUODENOSCOPY (EGD) WITH PROPOFOL N/A 05/12/2020   Procedure: ESOPHAGOGASTRODUODENOSCOPY (EGD) WITH PROPOFOL;  Surgeon: Milus Banister, MD;  [facility];  Service: Endoscopy;  Laterality: N/A;   EUS N/A 05/12/2020   Procedure: UPPER ENDOSCOPIC ULTRASOUND (EUS) RADIAL;  Surgeon: Milus Banister, MD;  [facility];  Service: Endoscopy;  Laterality: N/A;   KNEE SURGERY Bilateral 1996   x 2 - arthroscopic   LEFT HEART CATH AND CORONARY ANGIOGRAPHY N/A 11/25/2018   Procedure: LEFT HEART CATH AND CORONARY ANGIOGRAPHY;  Surgeon: Troy Sine, MD;  Location: [facility];  Service: Cardiovascular;  Laterality: N/A;   PELVIC LAPAROSCOPY  1989   W LYSIS OF ADHESIONS/L SALPINGONEOSTOMY   TUBAL LIGATION     WISDOM TOOTH EXTRACTION       Family History  Problem Relation Age of Onset   Miscarriages / [place] Mother    Early death Mother    Cancer Mother    Uterine cancer Mother 3   Heart attack Mother    Arthritis Father    Cancer Father    Aneurysm Father    Other Sister        MGUS     Other Sister        MA   COPD Brother    Skin cancer Brother    Skin cancer Brother    Heart disease Paternal Grandfather    Colon cancer Neg Hx    Rectal cancer Neg Hx    Stomach cancer Neg Hx    Stroke Neg Hx  Neuropathy Neg Hx    Colon polyps Neg Hx    Esophageal cancer Neg Hx      Social History   Socioeconomic History   Marital status: Married    Spouse name: don   Number of children: 2   Years of education: College   Highest education level: Not on file  Occupational History   Occupation: Realtor  Tobacco Use   Smoking status: Former    Packs/day: 0.15    Years: 20.00    Total pack years: 3.00    Types: Cigarettes    Quit date: 11/12/1998    Years since quitting: 23.8   Smokeless tobacco: Never  Vaping Use   Vaping Use: Never used  Substance and Sexual Activity   Alcohol use: Not Currently   Drug use: No   Sexual activity: Not Currently    Birth control/protection: Post-menopausal, Surgical    Comment: HYSTERECTOMY  Other Topics Concern   Not on file  Social History Narrative   Lives at home with her husband Don   Right handed   Caffeine: unsweet tea, 2 glasses daily   Social Determinants of Health   Financial Resource Strain: Not on file  Food Insecurity: Not on file  Transportation Needs: Not on file  Physical Activity: Not on file  Stress: Not on file  Social Connections: Not on file  Intimate Partner Violence: Not on file     BP (!) 144/70 (BP Location: Left Arm)   Pulse (!) 50   Resp 20   Wt 141 lb (64 kg)   LMP  (LMP Unknown)   BMI 24.98 kg/m   Physical Exam:  Well appearing NAD HEENT: Unremarkable except mild left facial droop. Neck:  No JVD, no thyromegally Lymphatics:  No adenopathy Back:  No CVA tenderness Lungs:  Clear with no wheezes HEART:  Regular rate rhythm, no murmurs, no rubs, no clicks Abd:  soft, positive bowel sounds, no organomegally, no rebound, no guarding Ext:  2 plus pulses, no edema, no cyanosis, no  clubbing Skin:  No rashes no nodules Neuro:  CN II through XII intact, motor grossly intact   Assess/Plan: Cryptogenic stroke - I have discussed the treatment options with the patient and recommended insertion of an ILR due to cryptogenic stroke. She is willing to proceed.   EP Procedure Note  SURGEON:  Cristopher Peru, MD     PREPROCEDURE DIAGNOSIS:  Cryptogenic Stroke    POSTPROCEDURE DIAGNOSIS:  Cryptogenic Stroke     PROCEDURES:   1. Implantable loop recorder implantation    INTRODUCTION:  Robin Arellano is a 71 y.o. female with a history of unexplained stroke who presents today for implantable loop implantation.  The patient has had a cryptogenic stroke.  Despite an extensive workup by neurology, no reversible causes have been identified.  The patient has worn telemetry/30 day monitor during which there were no arrhythmias.  There is significant concern for possible atrial fibrillation as the cause for the patients stroke.  The patient therefore presents today for implantable loop implantation.     DESCRIPTION OF PROCEDURE:  Informed written consent was obtained.  The patient required no sedation for the procedure today.  Mapping over the patient's chest was performed by the EP lab staff to identify the area where electrograms were most prominent for ILR recording.  This area was found to be the left parasternal region over the 3rd-4th intercostal space. The patients left chest was therefore prepped and draped in the usual sterile  fashion by the EP lab staff. The skin overlying the left parasternal region was infiltrated with lidocaine for local analgesia.  A 0.5-cm incision was made over the left parasternal region over the 3rd intercostal space.  A subcutaneous ILR pocket was fashioned using a combination of sharp and blunt dissection.  A Medtronic Reveal [place] model [id number] implantable loop recorder was then placed into the pocket  R waves were very prominent and measured  0.8 mV. EBL<1 ml.  Steri- Strips and a sterile dressing were then applied.  There were no early apparent complications.     CONCLUSIONS:   1. Successful implantation of a Medtronic Reveal LINQ implantable loop recorder for cryptogenic stroke  2. No early apparent complications.   Cristopher Peru, MD 09/28/2022 2:16 PM

## 2022-09-28 NOTE — Telephone Encounter (Signed)
Patient daughter called in stating her mom got implanted today and it is bleeding very bad and she is/has been applying pressure and wants to know what to do

## 2022-09-28 NOTE — Telephone Encounter (Signed)
Spoke with patients daughter she stated she thought the site had quit bleeding, patient asked about pain advised her that pain would be a little worse now especially after pressure was held to the site for 10 min, advised patients daughter to try ice over site with cloth in between ice and skin as well as Tylenol, patients daughter voiced understanding

## 2022-09-28 NOTE — Patient Instructions (Addendum)
Medication Instructions:  Your physician recommends that you continue on your current medications as directed. Please refer to the Current Medication list given to you today.  Labwork: None ordered.  Testing/Procedures: None ordered.  Follow-Up:  Your physician wants you to follow-up in: one year with Dr Lovena Le or APP.  You will receive a reminder letter in the mail two months in advance. If you don't receive a letter, please call our office to schedule the follow-up appointment.    Implantable Loop Recorder Placement, Care After This sheet gives you information about how to care for yourself after your procedure. Your health care provider may also give you more specific instructions. If you have problems or questions, contact your health care provider. What can I expect after the procedure? After the procedure, it is common to have: Soreness or discomfort near the incision. Some swelling or bruising near the incision.  Follow these instructions at home: Incision care  Monitor your cardiac device site for redness, swelling, and drainage. Call the device clinic at [phone] if you experience these symptoms or fever/chills.  Keep the large square bandage on your site for 24 hours and then you may remove it yourself. Keep the steri-strips underneath in place.   You may shower after 72 hours / 3 days from your procedure with the steri-strips in place. They will usually fall off on their own, or may be removed after 10 days. Pat dry.   Avoid lotions, ointments, or perfumes over your incision until it is well-healed.  Please do not submerge in water until your site is completely healed.   Your device is MRI compatible.   Remote monitoring is used to monitor your cardiac device from home. This monitoring is scheduled every month by our office. It allows [place] to keep an eye on the function of your device to ensure it is working properly.  If your wound site starts to bleed apply  pressure.    For help with the monitor please call [employer] Monitor Support Specialist directly at [phone].    If you have any questions/concerns please call the device clinic at [phone].  Activity  Return to your normal activities.  General instructions Follow instructions from your health care provider about how to manage your implantable loop recorder and transmit the information. Learn how to activate a recording if this is necessary for your type of device. You may go through a metal detection gate, and you may let someone hold a metal detector over your chest. Show your ID card if needed. Do not have an MRI unless you check with your health care provider first. Take over-the-counter and prescription medicines only as told by your health care provider. Keep all follow-up visits as told by your health care provider. This is important. Contact a health care provider if: You have redness, swelling, or pain around your incision. You have a fever. You have pain that is not relieved by your pain medicine. You have triggered your device because of fainting (syncope) or because of a heartbeat that feels like it is racing, slow, fluttering, or skipping (palpitations). Get help right away if you have: Chest pain. Difficulty breathing. Summary After the procedure, it is common to have soreness or discomfort near the incision. Change your dressing as told by your health care provider. Follow instructions from your health care provider about how to manage your implantable loop recorder and transmit the information. Keep all follow-up visits as told by your health care provider. This is important.  This information is not intended to replace advice given to you by your health care provider. Make sure you discuss any questions you have with your health care provider. Document Released: 10/10/2015 Document Revised: 12/14/2017 Document Reviewed: 12/14/2017 Elsevier Patient Education   2020 [employer].

## 2022-09-28 NOTE — Therapy (Incomplete)
OUTPATIENT PHYSICAL THERAPY NEURO TREATMENT   Patient Name: Robin Arellano MRN: [id number] DOB:08/02/1951, 72 y.o., female Today's Date: 09/28/2022   PCP: Tawnya Crook, MD REFERRING PROVIDER: Kristeen Miss, MD       Past Medical History:  Diagnosis Date   Allergy    Anemia    Anxiety    on meds   Back pain    Blood transfusion without reported diagnosis    Cataract    Chronic female pelvic pain    Chronic kidney disease    Coronary artery disease    mild, non-obstructive 11/2018   Depression    on meds   Family history of adverse reaction to anesthesia    sister had difficulty waking up   Fibromyalgia    H/O leukocytosis    Headache    Heart murmur    Hyperlipidemia    on meds   Hypertension    on meds   MI (myocardial infarction) ([place])    Pt states she did not have a MI- EKG was normal, was GERD   Osteoarthritis    on meds   Ovarian cyst, right    PONV (postoperative nausea and vomiting)    Post-operative nausea and vomiting    Stroke ([place]) 05/14/2022   L PCA   SVD (spontaneous vaginal delivery)    x 2   Vitamin D deficiency    Past Surgical History:  Procedure Laterality Date   ABDOMINAL HYSTERECTOMY  1994   TAH.BSO   ANTERIOR CERVICAL DECOMP/DISCECTOMY FUSION  2019   APPENDECTOMY  1975   BACK SURGERY  2023   BIOPSY  05/12/2020   Procedure: BIOPSY;  Surgeon: Milus Banister, MD;  Location: [facility];  Service: Endoscopy;;   CARDIAC CATHETERIZATION  2020   CHOLECYSTECTOMY N/A 07/21/2020   Procedure: LAPAROSCOPIC CHOLECYSTECTOMY WITH INTRAOPERATIVE CHOLANGIOGRAM;  Surgeon: [name], MD;  Location: [facility];  Service: General;  Laterality: N/A;   COLONOSCOPY  08/12/2017   Hx TA (piecemeal)Jacobs-MAC-suprep (good)   ESOPHAGOGASTRODUODENOSCOPY (EGD) WITH PROPOFOL N/A 05/12/2020   Procedure: ESOPHAGOGASTRODUODENOSCOPY (EGD) WITH PROPOFOL;  Surgeon: Milus Banister, MD;  Location: [facility];  Service: Endoscopy;  Laterality: N/A;    EUS N/A 05/12/2020   Procedure: UPPER ENDOSCOPIC ULTRASOUND (EUS) RADIAL;  Surgeon: Milus Banister, MD;  Location: [facility];  Service: Endoscopy;  Laterality: N/A;   KNEE SURGERY Bilateral 1996   x 2 - arthroscopic   LEFT HEART CATH AND CORONARY ANGIOGRAPHY N/A 11/25/2018   Procedure: LEFT HEART CATH AND CORONARY ANGIOGRAPHY;  Surgeon: Troy Sine, MD;  Location: [facility];  Service: Cardiovascular;  Laterality: N/A;   PELVIC LAPAROSCOPY  1989   W LYSIS OF ADHESIONS/L SALPINGONEOSTOMY   TUBAL LIGATION     WISDOM TOOTH EXTRACTION     Patient Active Problem List   Diagnosis Date Noted   Nausea & vomiting 06/01/2022   Chronic kidney disease, stage 3b ([place]) 05/27/2022   Mixed hyperlipidemia 05/27/2022   Pressure injury of skin 05/27/2022   COVID-19 virus infection 05/26/2022   Hyponatremia    Acute left PCA stroke ([place]) 05/22/2022   Hypertensive emergency    Pseudoaneurysm ([place]) 05/14/2022   Spondylolisthesis at L4-L5 level 04/26/2022   Hyperlipidemia 11/25/2020   Essential hypertension 07/11/2020   Common bile duct dilation    Acute pancreatitis 04/01/2020   Anxiety    Hypokalemia    Mild renal insufficiency    Elevated troponin    Hypertensive urgency    Elevated liver  enzymes    Abnormal magnetic resonance imaging of abdomen    Coronary artery disease involving native coronary artery of native heart without angina pectoris    Chest pain    Herpes zoster without complication 85/12/7739   Abdominal pain 02/07/2018   Abdominal pain, epigastric 02/07/2018   Low back pain 07/09/2013   Neck pain 07/09/2013   Headache 07/09/2013   Fibromyalgia 07/09/2013    ONSET DATE: 08/04/22  REFERRING DIAG: M54.16 (ICD-10-CM) - Radiculopathy, lumbar region   THERAPY DIAG:  No diagnosis found.  Rationale for Evaluation and Treatment: Rehabilitation  SUBJECTIVE:                                                                                                                                                                                              SUBJECTIVE STATEMENT: "I think the weights last time were too much because of my back." Getting a heart monitor tomorrow.  Pt accompanied by: self  PERTINENT HISTORY: HTN, CVA, Stage III kidney disease, lumbar laminectomy, h/o cervical laminectomy, RA, fibromyalgia, pancreatitis, h/o frequent falls and vertigo  PAIN:  Are you having pain? Yes: NPRS scale: 5/10 Pain location: R>L LB Pain description: constant Aggravating factors: constant pain Relieving factors: constant in nature  PRECAUTIONS: Fall  WEIGHT BEARING RESTRICTIONS: No  FALLS: Has patient fallen in last 6 months? Yes, none since 1 major fall after surgery (around time of CVA)  PATIENT GOALS: Getting more independent, walking without device.  Has not been able to drive x months before surgery.   Goal to improve strength and balance.  OBJECTIVE:    TODAY'S TREATMENT: 10/01/22 Activity Comments                       HOME EXERCISE PROGRAM Last updated: 09/25/22 Access Code: [email or web address] URL: [email or web address] Date: 09/24/2022 Prepared by: [facility]  Exercises - Sit to Stand with Counter Support  - 1 x daily - 5 x weekly - 2 sets - 10 reps - Seated Long Arc Quad  - 1 x daily - 5 x weekly - 2 sets - 10 reps - Ice  - 1 x daily - 5 x weekly   Below measures were taken at time of initial evaluation unless otherwise specified:   DIAGNOSTIC FINDINGS: 1. L4-5 PLIF with bilateral laminectomies. Mild left neural foraminal stenosis. 2. No acute osseous abnormality.  COGNITION: Overall cognitive status: Impaired and she reports daughter and husband help with medication mgmt   SENSATION: Numbness and tingling in her feet  COORDINATION: WNLs and equal bilaterally  MUSCLE TONE:  WNLs  POSTURE: rounded shoulders and forward head  UPPER EXTREMITY ROM:  bilateral shoulder AROM  to 90 degrees flexion and abduction  LOWER EXTREMITY ROM:   WFLs.  LOWER EXTREMITY MMT:    MMT Right Eval Left Eval  Hip flexion 3/5 3/5  Hip extension    Hip abduction    Hip adduction    Hip internal rotation    Hip external rotation    Knee flexion 4-/5 4-/5  Knee extension 4/5 4/5  Ankle dorsiflexion 3/5 3/5  Ankle plantarflexion    Ankle inversion    Ankle eversion    (Blank rows = not tested)  BED MOBILITY:  Gets in and out of bed independently  TRANSFERS: Assistive device utilized: Environmental consultant - 2 wheeled  Sit to stand: Modified independence Stand to sit: Modified independence  STAIRS: Level of Assistance: Min A Stair Negotiation Technique: Alternating Pattern  with Bilateral Rails Number of Stairs: 2  Height of Stairs: 6"  Comments: Reports needing help at home for stairs  GAIT: Gait pattern: decreased step length- Right, decreased step length- Left, decreased stride length, decreased hip/knee flexion- Right, decreased hip/knee flexion- Left, decreased ankle dorsiflexion- Right, decreased ankle dorsiflexion- Left, decreased trunk rotation, narrow BOS, and poor foot clearance- Left Distance walked: 100 Assistive device utilized: Walker - 2 wheeled Level of assistance: CGA Comments: frequent L foot drag during gait, responds well with cues for heel strike  FUNCTIONAL TESTS:  Berg=27/56 Gait speed= 1.04 ft/sec  BERG BALANCE TEST Sitting to Standing: 3.      Stands independently using hands Standing Unsupported: 4.      Stands safely for 2 minutes Sitting Unsupported: 4.     Sits for 2 minutes independently Standing to Sitting: 4.     Sits safely with minimal use of hands Transfers: 3.     Transfers safely definite use of hands Standing with eyes closed: 3.     Stands 10 seconds with supervision Standing with feet together: 3.     Stands for 1 minute with supervision Reaching forward with outstretched arm: 1.     Reaches forward with supervision Retrieving  object from the floor: 0.     Unable/needs assistance to keep from falling Turning to look behind: 1.     Needs supervision when turning Turning 360 degrees: 0.     Needs assistance Place alternate foot on stool: 0.     Unable, needs assist to keep from falling Standing with one foot in front: 1.     Needs help to step, but can hold for 15 seconds Standing on one foot: 0.     Unable  Total Score: 27/56  PATIENT SURVEYS:  N/a-- CVA, also is s/p back surgery  TODAY'S TREATMENT:                                                                                                                              DATE: 09/13/22  Sit to stand x 5 reps  PATIENT EDUCATION: Education details: HEP Person educated: Patient Education method: Explanation, Demonstration, and Handouts Education comprehension: verbalized understanding and returned demonstration  HOME EXERCISE PROGRAM: Access Code: [email or web address] URL: [email or web address] Date: 09/13/2022 Prepared by: Rudell Cobb  Exercises - Sit to Stand with Armchair  - 2 x daily - 7 x weekly - 1 sets - 5 reps  GOALS: Goals reviewed with patient? Yes  SHORT TERM GOALS: Target date: 10/11/2022  The patient will be indep with HEP. Goal status: IN PROGRESS  2.  The patient will improve Berg balance score to > or equal to 33/56. Baseline: 27/56 Goal status: IN PROGRESS  3.  The patient will improve gait speed to > or equal to 1.5 ft/sec. Baseline: 1.04 ft/sec Goal status: IN PROGRESS  4.  The patient will ambulate mod indep with RW x 300 ft to demo improved household mobility. Baseline: walks short distances in home Goal status: IN PROGRESS  5.  The patient will negotiate 4 steps with supervision with one handrail to improve access to home environment. Baseline: min A Goal status: IN PROGRESS  LONG TERM GOALS: Target date: 11/09/2023  The patient will be indep with HEP progression. Baseline:  no HEP Goal status: IN  PROGRESS  2.  The patient will improve Berg score to > or equal to 38/56 to demo dec'ing risk for falls. Baseline: 27/56 Goal status: IN PROGRESS  3.  The patient will improve gait speed to > or equal to 2.0 ft/sec to demo dec'ing risk for falls and improved community access. Baseline: 1.04 ft/sec Goal status: IN PROGRESS  4.  The patient will negotiate household surfaces without a device mod indep. Baseline:  Uses RW Goal status: IN PROGRESS  5.  The patient will report low back pain < or equal to 3/10 during standing activities x 10 minutes. Baseline: 6/10 today Goal status: IN PROGRESS  ASSESSMENT:  CLINICAL IMPRESSION: Patient arrived to session with report of increased LBP after last session, requesting to decrease weight training today. Continued working on STS transfers with decreased UE reliance from elevated seat- able to tolerated light weight today. Static balance activities incorporated narrow stance and EC with good stability. Some hesitancy evident with stepping activities, requiring cues to increase step length. Patient demonstrated LOB with sidestepping and backwards walking, requiring assist to correct. Noted L SIJ pain after this misstep, which was addressed with gentle stretching. Patient reported much better tolerance for today's session compared to previous visit. .   OBJECTIVE IMPAIRMENTS: Abnormal gait, decreased activity tolerance, decreased balance, decreased endurance, decreased ROM, decreased strength, impaired UE functional use, and pain.   ACTIVITY LIMITATIONS: bending, standing, squatting, stairs, transfers, bathing, dressing, reach over head, and locomotion level  PARTICIPATION LIMITATIONS: meal prep, cleaning, laundry, medication management, interpersonal relationship, driving, and community activity  PERSONAL FACTORS: 3+ comorbidities: HTN, CVA, low back surgery  are also affecting patient's functional outcome.   REHAB POTENTIAL: Good  CLINICAL  DECISION MAKING: Stable/uncomplicated  EVALUATION COMPLEXITY: Low  PLAN:  PT FREQUENCY: 2x/week  PT DURATION: 8 weeks  PLANNED INTERVENTIONS: Therapeutic exercises, Therapeutic activity, Neuromuscular re-education, Balance training, Gait training, Patient/Family education, Self Care, Joint mobilization, Stair training, Visual/preceptual remediation/compensation, DME instructions, Aquatic Therapy, Dry Needling, Moist heat, Taping, Manual therapy, and Re-evaluation  PLAN FOR NEXT SESSION: Establish HEP (only 1 exercise today) slowly, work on gait safety and mechanics (longer stride), standing balance, LE strength, and low back/core stabilization.  Janene Harvey, PT, DPT 09/28/22 12:22 PM  [facility] at [facility] [address], [place], [place] [address] Phone # [phone] Fax # [phone]

## 2022-09-29 LAB — LIPID PANEL
Chol/HDL Ratio: 2.8 ratio (ref 0.0–4.4)
Cholesterol, Total: 139 mg/dL (ref 100–199)
HDL: 50 mg/dL
LDL Chol Calc (NIH): 65 mg/dL (ref 0–99)
Triglycerides: 139 mg/dL (ref 0–149)
VLDL Cholesterol Cal: 24 mg/dL (ref 5–40)

## 2022-10-01 ENCOUNTER — Ambulatory Visit: Payer: PPO | Admitting: Physical Therapy

## 2022-10-01 ENCOUNTER — Other Ambulatory Visit: Payer: Self-pay | Admitting: Family Medicine

## 2022-10-02 NOTE — Therapy (Incomplete)
OUTPATIENT PHYSICAL THERAPY NEURO TREATMENT   Patient Name: Robin Arellano MRN: [id number] DOB:[birth year], 71 y.o., female Today's Date: 10/02/2022   PCP: Tawnya Crook, MD REFERRING PROVIDER: Kristeen Miss, MD       Past Medical History:  Diagnosis Date   Allergy    Anemia    Anxiety    on meds   Back pain    Blood transfusion without reported diagnosis    Cataract    Chronic female pelvic pain    Chronic kidney disease    Coronary artery disease    mild, non-obstructive 11/2018   Depression    on meds   Family history of adverse reaction to anesthesia    sister had difficulty waking up   Fibromyalgia    H/O leukocytosis    Headache    Heart murmur    Hyperlipidemia    on meds   Hypertension    on meds   MI (myocardial infarction) ([place])    Pt states she did not have a MI- EKG was normal, was GERD   Osteoarthritis    on meds   Ovarian cyst, right    PONV (postoperative nausea and vomiting)    Post-operative nausea and vomiting    Stroke ([place]) 05/14/2022   L PCA   SVD (spontaneous vaginal delivery)    x 2   Vitamin D deficiency    Past Surgical History:  Procedure Laterality Date   ABDOMINAL HYSTERECTOMY  1994   TAH.BSO   ANTERIOR CERVICAL DECOMP/DISCECTOMY FUSION  2019   APPENDECTOMY  1975   BACK SURGERY  2023   BIOPSY  05/12/2020   Procedure: BIOPSY;  Surgeon: Milus Banister, MD;  Location: [facility];  Service: Endoscopy;;   CARDIAC CATHETERIZATION  2020   CHOLECYSTECTOMY N/A 07/21/2020   Procedure: LAPAROSCOPIC CHOLECYSTECTOMY WITH INTRAOPERATIVE CHOLANGIOGRAM;  Surgeon: [name], MD;  Location: [facility];  Service: General;  Laterality: N/A;   COLONOSCOPY  08/12/2017   Hx TA (piecemeal)Jacobs-MAC-suprep (good)   ESOPHAGOGASTRODUODENOSCOPY (EGD) WITH PROPOFOL N/A 05/12/2020   Procedure: ESOPHAGOGASTRODUODENOSCOPY (EGD) WITH PROPOFOL;  Surgeon: Milus Banister, MD;  Location: [facility];  Service: Endoscopy;  Laterality: N/A;    EUS N/A 05/12/2020   Procedure: UPPER ENDOSCOPIC ULTRASOUND (EUS) RADIAL;  Surgeon: Milus Banister, MD;  Location: [facility];  Service: Endoscopy;  Laterality: N/A;   KNEE SURGERY Bilateral 1996   x 2 - arthroscopic   LEFT HEART CATH AND CORONARY ANGIOGRAPHY N/A 11/25/2018   Procedure: LEFT HEART CATH AND CORONARY ANGIOGRAPHY;  Surgeon: Troy Sine, MD;  Location: [facility];  Service: Cardiovascular;  Laterality: N/A;   PELVIC LAPAROSCOPY  1989   W LYSIS OF ADHESIONS/L SALPINGONEOSTOMY   TUBAL LIGATION     WISDOM TOOTH EXTRACTION     Patient Active Problem List   Diagnosis Date Noted   Nausea & vomiting 06/01/2022   Chronic kidney disease, stage 3b ([place]) 05/27/2022   Mixed hyperlipidemia 05/27/2022   Pressure injury of skin 05/27/2022   COVID-19 virus infection 05/26/2022   Hyponatremia    Cryptogenic stroke ([place]) 05/22/2022   Hypertensive emergency    Pseudoaneurysm ([place]) 05/14/2022   Spondylolisthesis at L4-L5 level 04/26/2022   Hyperlipidemia 11/25/2020   Essential hypertension 07/11/2020   Common bile duct dilation    Acute pancreatitis 04/01/2020   Anxiety    Hypokalemia    Mild renal insufficiency    Elevated troponin    Hypertensive urgency    Elevated liver enzymes  Abnormal magnetic resonance imaging of abdomen    Coronary artery disease involving native coronary artery of native heart without angina pectoris    Chest pain    Herpes zoster without complication 63/84/5364   Abdominal pain 02/07/2018   Abdominal pain, epigastric 02/07/2018   Low back pain 07/09/2013   Neck pain 07/09/2013   Headache 07/09/2013   Fibromyalgia 07/09/2013    ONSET DATE: 08/04/22  REFERRING DIAG: M54.16 (ICD-10-CM) - Radiculopathy, lumbar region   THERAPY DIAG:  No diagnosis found.  Rationale for Evaluation and Treatment: Rehabilitation  SUBJECTIVE:                                                                                                                                                                                              SUBJECTIVE STATEMENT: "I think the weights last time were too much because of my back." Getting a heart monitor tomorrow.  Pt accompanied by: self  PERTINENT HISTORY: HTN, CVA, Stage III kidney disease, lumbar laminectomy, h/o cervical laminectomy, RA, fibromyalgia, pancreatitis, h/o frequent falls and vertigo  PAIN:  Are you having pain? Yes: NPRS scale: 5/10 Pain location: R>L LB Pain description: constant Aggravating factors: constant pain Relieving factors: constant in nature  PRECAUTIONS: Fall  WEIGHT BEARING RESTRICTIONS: No  FALLS: Has patient fallen in last 6 months? Yes, none since 1 major fall after surgery (around time of CVA)  PATIENT GOALS: Getting more independent, walking without device.  Has not been able to drive x months before surgery.   Goal to improve strength and balance.  OBJECTIVE:    TODAY'S TREATMENT: 10/03/22 Activity Comments                       HOME EXERCISE PROGRAM Last updated: 09/25/22 Access Code: [email or web address] URL: [email or web address] Date: 09/24/2022 Prepared by: [facility]  Exercises - Sit to Stand with Counter Support  - 1 x daily - 5 x weekly - 2 sets - 10 reps - Seated Long Arc Quad  - 1 x daily - 5 x weekly - 2 sets - 10 reps - Ice  - 1 x daily - 5 x weekly   Below measures were taken at time of initial evaluation unless otherwise specified:   DIAGNOSTIC FINDINGS: 1. L4-5 PLIF with bilateral laminectomies. Mild left neural foraminal stenosis. 2. No acute osseous abnormality.  COGNITION: Overall cognitive status: Impaired and she reports daughter and husband help with medication mgmt   SENSATION: Numbness and tingling in her feet  COORDINATION: WNLs and equal bilaterally  MUSCLE TONE: WNLs  POSTURE: rounded  shoulders and forward head  UPPER EXTREMITY ROM:  bilateral shoulder AROM to  90 degrees flexion and abduction  LOWER EXTREMITY ROM:   WFLs.  LOWER EXTREMITY MMT:    MMT Right Eval Left Eval  Hip flexion 3/5 3/5  Hip extension    Hip abduction    Hip adduction    Hip internal rotation    Hip external rotation    Knee flexion 4-/5 4-/5  Knee extension 4/5 4/5  Ankle dorsiflexion 3/5 3/5  Ankle plantarflexion    Ankle inversion    Ankle eversion    (Blank rows = not tested)  BED MOBILITY:  Gets in and out of bed independently  TRANSFERS: Assistive device utilized: Environmental consultant - 2 wheeled  Sit to stand: Modified independence Stand to sit: Modified independence  STAIRS: Level of Assistance: Min A Stair Negotiation Technique: Alternating Pattern  with Bilateral Rails Number of Stairs: 2  Height of Stairs: 6"  Comments: Reports needing help at home for stairs  GAIT: Gait pattern: decreased step length- Right, decreased step length- Left, decreased stride length, decreased hip/knee flexion- Right, decreased hip/knee flexion- Left, decreased ankle dorsiflexion- Right, decreased ankle dorsiflexion- Left, decreased trunk rotation, narrow BOS, and poor foot clearance- Left Distance walked: 100 Assistive device utilized: Walker - 2 wheeled Level of assistance: CGA Comments: frequent L foot drag during gait, responds well with cues for heel strike  FUNCTIONAL TESTS:  Berg=27/56 Gait speed= 1.04 ft/sec  BERG BALANCE TEST Sitting to Standing: 3.      Stands independently using hands Standing Unsupported: 4.      Stands safely for 2 minutes Sitting Unsupported: 4.     Sits for 2 minutes independently Standing to Sitting: 4.     Sits safely with minimal use of hands Transfers: 3.     Transfers safely definite use of hands Standing with eyes closed: 3.     Stands 10 seconds with supervision Standing with feet together: 3.     Stands for 1 minute with supervision Reaching forward with outstretched arm: 1.     Reaches forward with supervision Retrieving object  from the floor: 0.     Unable/needs assistance to keep from falling Turning to look behind: 1.     Needs supervision when turning Turning 360 degrees: 0.     Needs assistance Place alternate foot on stool: 0.     Unable, needs assist to keep from falling Standing with one foot in front: 1.     Needs help to step, but can hold for 15 seconds Standing on one foot: 0.     Unable  Total Score: 27/56  PATIENT SURVEYS:  N/a-- CVA, also is s/p back surgery  TODAY'S TREATMENT:                                                                                                                              DATE: 09/13/22    Sit to stand  x 5 reps  PATIENT EDUCATION: Education details: HEP Person educated: Patient Education method: Explanation, Demonstration, and Handouts Education comprehension: verbalized understanding and returned demonstration  HOME EXERCISE PROGRAM: Access Code: [email or web address] URL: [email or web address] Date: 09/13/2022 Prepared by: Rudell Cobb  Exercises - Sit to Stand with Armchair  - 2 x daily - 7 x weekly - 1 sets - 5 reps  GOALS: Goals reviewed with patient? Yes  SHORT TERM GOALS: Target date: 10/11/2022  The patient will be indep with HEP. Goal status: IN PROGRESS  2.  The patient will improve Berg balance score to > or equal to 33/56. Baseline: 27/56 Goal status: IN PROGRESS  3.  The patient will improve gait speed to > or equal to 1.5 ft/sec. Baseline: 1.04 ft/sec Goal status: IN PROGRESS  4.  The patient will ambulate mod indep with RW x 300 ft to demo improved household mobility. Baseline: walks short distances in home Goal status: IN PROGRESS  5.  The patient will negotiate 4 steps with supervision with one handrail to improve access to home environment. Baseline: min A Goal status: IN PROGRESS  LONG TERM GOALS: Target date: 11/09/2023  The patient will be indep with HEP progression. Baseline:  no HEP Goal status: IN  PROGRESS  2.  The patient will improve Berg score to > or equal to 38/56 to demo dec'ing risk for falls. Baseline: 27/56 Goal status: IN PROGRESS  3.  The patient will improve gait speed to > or equal to 2.0 ft/sec to demo dec'ing risk for falls and improved community access. Baseline: 1.04 ft/sec Goal status: IN PROGRESS  4.  The patient will negotiate household surfaces without a device mod indep. Baseline:  Uses RW Goal status: IN PROGRESS  5.  The patient will report low back pain < or equal to 3/10 during standing activities x 10 minutes. Baseline: 6/10 today Goal status: IN PROGRESS  ASSESSMENT:  CLINICAL IMPRESSION: Patient arrived to session with report of increased LBP after last session, requesting to decrease weight training today. Continued working on STS transfers with decreased UE reliance from elevated seat- able to tolerated light weight today. Static balance activities incorporated narrow stance and EC with good stability. Some hesitancy evident with stepping activities, requiring cues to increase step length. Patient demonstrated LOB with sidestepping and backwards walking, requiring assist to correct. Noted L SIJ pain after this misstep, which was addressed with gentle stretching. Patient reported much better tolerance for today's session compared to previous visit. .   OBJECTIVE IMPAIRMENTS: Abnormal gait, decreased activity tolerance, decreased balance, decreased endurance, decreased ROM, decreased strength, impaired UE functional use, and pain.   ACTIVITY LIMITATIONS: bending, standing, squatting, stairs, transfers, bathing, dressing, reach over head, and locomotion level  PARTICIPATION LIMITATIONS: meal prep, cleaning, laundry, medication management, interpersonal relationship, driving, and community activity  PERSONAL FACTORS: 3+ comorbidities: HTN, CVA, low back surgery  are also affecting patient's functional outcome.   REHAB POTENTIAL: Good  CLINICAL  DECISION MAKING: Stable/uncomplicated  EVALUATION COMPLEXITY: Low  PLAN:  PT FREQUENCY: 2x/week  PT DURATION: 8 weeks  PLANNED INTERVENTIONS: Therapeutic exercises, Therapeutic activity, Neuromuscular re-education, Balance training, Gait training, Patient/Family education, Self Care, Joint mobilization, Stair training, Visual/preceptual remediation/compensation, DME instructions, Aquatic Therapy, Dry Needling, Moist heat, Taping, Manual therapy, and Re-evaluation  PLAN FOR NEXT SESSION: Establish HEP (only 1 exercise today) slowly, work on gait safety and mechanics (longer stride), standing balance, LE strength, and low back/core stabilization.  Janene Harvey, PT, DPT 10/02/22 8:33 AM  [facility] [address] [place], [place] [address] Phone # [phone] Fax # [phone]

## 2022-10-03 ENCOUNTER — Ambulatory Visit: Payer: PPO | Admitting: Physical Therapy

## 2022-10-03 ENCOUNTER — Encounter: Payer: Self-pay | Admitting: Internal Medicine

## 2022-10-03 NOTE — Telephone Encounter (Signed)
Pt sent mychart pix of bruising.  Advised via MyChart that the bruising did not look abnormal.  She had some bleeding post implant.  Advised to continue to monitor.  Reassurance provided.

## 2022-10-03 NOTE — Therapy (Addendum)
OUTPATIENT PHYSICAL THERAPY NEURO TREATMENT   Patient Name: Robin Arellano MRN: [id number] DOB:[birth year], 71 y.o., female Today's Date: 10/08/2022   Progress Note Reporting Period 09/13/22 to 10/08/22  See note below for Objective Data and Assessment of Progress/Goals.    PCP: Tawnya Crook, MD REFERRING PROVIDER: Kristeen Miss, MD   PT End of Session - 10/08/22 1358     Visit Number 4    Number of Visits 17    Date for PT Re-Evaluation 11/12/22    Authorization Type [employer]    Progress Note Due on Visit 10    PT Start Time 1320   pt late   PT Stop Time 1358    PT Time Calculation (min) 38 min    Equipment Utilized During Treatment Gait belt    Activity Tolerance Patient tolerated treatment well;Patient limited by pain    Behavior During Therapy WFL for tasks assessed/performed                Past Medical History:  Diagnosis Date   Allergy    Anemia    Anxiety    on meds   Back pain    Blood transfusion without reported diagnosis    Cataract    Chronic female pelvic pain    Chronic kidney disease    Coronary artery disease    mild, non-obstructive 11/2018   Depression    on meds   Family history of adverse reaction to anesthesia    sister had difficulty waking up   Fibromyalgia    H/O leukocytosis    Headache    Heart murmur    Hyperlipidemia    on meds   Hypertension    on meds   MI (myocardial infarction) ([place])    Pt states she did not have a MI- EKG was normal, was GERD   Osteoarthritis    on meds   Ovarian cyst, right    PONV (postoperative nausea and vomiting)    Post-operative nausea and vomiting    Stroke ([place]) 05/14/2022   L PCA   SVD (spontaneous vaginal delivery)    x 2   Vitamin D deficiency    Past Surgical History:  Procedure Laterality Date   ABDOMINAL HYSTERECTOMY  1994   TAH.BSO   ANTERIOR CERVICAL DECOMP/DISCECTOMY FUSION  2019   APPENDECTOMY  1975   BACK SURGERY  2023   BIOPSY  05/12/2020    Procedure: BIOPSY;  Surgeon: Milus Banister, MD;  Location: [facility];  Service: Endoscopy;;   CARDIAC CATHETERIZATION  2020   CHOLECYSTECTOMY N/A 07/21/2020   Procedure: LAPAROSCOPIC CHOLECYSTECTOMY WITH INTRAOPERATIVE CHOLANGIOGRAM;  Surgeon: [name], MD;  Location: [facility];  Service: General;  Laterality: N/A;   COLONOSCOPY  08/12/2017   Hx TA (piecemeal)Jacobs-MAC-suprep (good)   ESOPHAGOGASTRODUODENOSCOPY (EGD) WITH PROPOFOL N/A 05/12/2020   Procedure: ESOPHAGOGASTRODUODENOSCOPY (EGD) WITH PROPOFOL;  Surgeon: Milus Banister, MD;  Location: [facility];  Service: Endoscopy;  Laterality: N/A;   EUS N/A 05/12/2020   Procedure: UPPER ENDOSCOPIC ULTRASOUND (EUS) RADIAL;  Surgeon: Milus Banister, MD;  Location: [facility];  Service: Endoscopy;  Laterality: N/A;   KNEE SURGERY Bilateral 1996   x 2 - arthroscopic   LEFT HEART CATH AND CORONARY ANGIOGRAPHY N/A 11/25/2018   Procedure: LEFT HEART CATH AND CORONARY ANGIOGRAPHY;  Surgeon: Troy Sine, MD;  Location: [facility];  Service: Cardiovascular;  Laterality: N/A;   PELVIC LAPAROSCOPY  1989   W LYSIS OF ADHESIONS/L SALPINGONEOSTOMY  TUBAL LIGATION     WISDOM TOOTH EXTRACTION     Patient Active Problem List   Diagnosis Date Noted   Nausea & vomiting 06/01/2022   Chronic kidney disease, stage 3b ([place]) 05/27/2022   Mixed hyperlipidemia 05/27/2022   Pressure injury of skin 05/27/2022   COVID-19 virus infection 05/26/2022   Hyponatremia    Cryptogenic stroke ([place]) 05/22/2022   Hypertensive emergency    Pseudoaneurysm ([place]) 05/14/2022   Spondylolisthesis at L4-L5 level 04/26/2022   Hyperlipidemia 11/25/2020   Essential hypertension 07/11/2020   Common bile duct dilation    Acute pancreatitis 04/01/2020   Anxiety    Hypokalemia    Mild renal insufficiency    Elevated troponin    Hypertensive urgency    Elevated liver enzymes    Abnormal magnetic resonance imaging of abdomen    Coronary artery disease  involving native coronary artery of native heart without angina pectoris    Chest pain    Herpes zoster without complication [id number]   Abdominal pain 02/07/2018   Abdominal pain, epigastric 02/07/2018   Low back pain 07/09/2013   Neck pain 07/09/2013   Headache 07/09/2013   Fibromyalgia 07/09/2013    ONSET DATE: 08/04/22  REFERRING DIAG: M54.16 (ICD-10-CM) - Radiculopathy, lumbar region   THERAPY DIAG:  Other abnormalities of gait and mobility  Muscle weakness (generalized)  Unsteadiness on feet  Other low back pain  Rationale for Evaluation and Treatment: Rehabilitation  SUBJECTIVE:                                                                                                                                                                                             SUBJECTIVE STATEMENT: Had a lot of bleeding after her loop recorder placement. Doing better now. Denies any movement restrictions since procedure.  Pt accompanied by: self  PERTINENT HISTORY: HTN, CVA, Stage III kidney disease, lumbar laminectomy, h/o cervical laminectomy, RA, fibromyalgia, pancreatitis, h/o frequent falls and vertigo  PAIN:  Are you having pain? Yes: NPRS scale: 6/10 Pain location: R>L LB Pain description: constant Aggravating factors: constant pain Relieving factors: constant in nature  PRECAUTIONS: Fall, loop recorder  WEIGHT BEARING RESTRICTIONS: No  FALLS: Has patient fallen in last 6 months? Yes, none since 1 major fall after surgery (around time of CVA)  PATIENT GOALS: Getting more independent, walking without device.  Has not been able to drive x months before surgery.   Goal to improve strength and balance.  OBJECTIVE:    TODAY'S TREATMENT: 10/08/22 Activity Comments  Nustep L4 x 7 Ues/Les  Maintaining ~40 SPM  Stair navigation with quad cane x2 Cueing for  cane sequencing; good safety   STS EC 10x  Good stability; slightly limited eccentric control   STS on foam 10x   Using hand support; c/o fear of falling   gait training with quad cane, quad tip cane, SPC ~200 ft  Cueing to increased step length and avoid shuffling; good stability but occasionally losing proper sequencing   backwards walking 40f x2 CGA-min A; hesitant stepping   Sidestepping 597fx2 CGA-min A; hesitant stepping; toe out      PATIENT EDUCATION: Education details: [facility] educated: Patient Education method: ExConsulting civil engineerDemonstration, Tactile cues, Verbal cues, and Handouts Education comprehension: verbalized understanding and returned demonstration   HOME EXERCISE PROGRAM Last updated: 09/25/22 Access Code: [email or web address] URL: [email or web address] Date: 09/24/2022 Prepared by: [facility]  Exercises - Sit to Stand with Counter Support  - 1 x daily - 5 x weekly - 2 sets - 10 reps - Seated Long Arc Quad  - 1 x daily - 5 x weekly - 2 sets - 10 reps - Ice  - 1 x daily - 5 x weekly   Below measures were taken at time of initial evaluation unless otherwise specified:   DIAGNOSTIC FINDINGS: 1. L4-5 PLIF with bilateral laminectomies. Mild left neural foraminal stenosis. 2. No acute osseous abnormality.  COGNITION: Overall cognitive status: Impaired and she reports daughter and husband help with medication mgmt   SENSATION: Numbness and tingling in her feet  COORDINATION: WNLs and equal bilaterally  MUSCLE TONE: WNLs  POSTURE: rounded shoulders and forward head  UPPER EXTREMITY ROM:  bilateral shoulder AROM to 90 degrees flexion and abduction  LOWER EXTREMITY ROM:   WFLs.  LOWER EXTREMITY MMT:    MMT Right Eval Left Eval  Hip flexion 3/5 3/5  Hip extension    Hip abduction    Hip adduction    Hip internal rotation    Hip external rotation    Knee flexion 4-/5 4-/5  Knee extension 4/5 4/5  Ankle dorsiflexion 3/5 3/5  Ankle plantarflexion    Ankle inversion    Ankle eversion    (Blank rows = not  tested)  BED MOBILITY:  Gets in and out of bed independently  TRANSFERS: Assistive device utilized: WaEnvironmental consultant 2 wheeled  Sit to stand: Modified independence Stand to sit: Modified independence  STAIRS: Level of Assistance: Min A Stair Negotiation Technique: Alternating Pattern  with Bilateral Rails Number of Stairs: 2  Height of Stairs: 6"  Comments: Reports needing help at home for stairs  GAIT: Gait pattern: decreased step length- Right, decreased step length- Left, decreased stride length, decreased hip/knee flexion- Right, decreased hip/knee flexion- Left, decreased ankle dorsiflexion- Right, decreased ankle dorsiflexion- Left, decreased trunk rotation, narrow BOS, and poor foot clearance- Left Distance walked: 100 Assistive device utilized: Walker - 2 wheeled Level of assistance: CGA Comments: frequent L foot drag during gait, responds well with cues for heel strike  FUNCTIONAL TESTS:  Berg=27/56 Gait speed= 1.04 ft/sec  BERG BALANCE TEST Sitting to Standing: 3.      Stands independently using hands Standing Unsupported: 4.      Stands safely for 2 minutes Sitting Unsupported: 4.     Sits for 2 minutes independently Standing to Sitting: 4.     Sits safely with minimal use of hands Transfers: 3.     Transfers safely definite use of hands Standing with eyes closed: 3.     Stands 10 seconds with supervision Standing with  feet together: 3.     Stands for 1 minute with supervision Reaching forward with outstretched arm: 1.     Reaches forward with supervision Retrieving object from the floor: 0.     Unable/needs assistance to keep from falling Turning to look behind: 1.     Needs supervision when turning Turning 360 degrees: 0.     Needs assistance Place alternate foot on stool: 0.     Unable, needs assist to keep from falling Standing with one foot in front: 1.     Needs help to step, but can hold for 15 seconds Standing on one foot: 0.     Unable  Total Score:  27/56  PATIENT SURVEYS:  N/a-- CVA, also is s/p back surgery  TODAY'S TREATMENT:                                                                                                                              DATE: 09/13/22    Sit to stand x 5 reps  PATIENT EDUCATION: Education details: HEP Person educated: Patient Education method: Explanation, Demonstration, and Handouts Education comprehension: verbalized understanding and returned demonstration  HOME EXERCISE PROGRAM: Access Code: [email or web address] URL: [email or web address] Date: 09/13/2022 Prepared by: Rudell Cobb  Exercises - Sit to Stand with Armchair  - 2 x daily - 7 x weekly - 1 sets - 5 reps  GOALS: Goals reviewed with patient? Yes  SHORT TERM GOALS: Target date: 10/11/2022  The patient will be indep with HEP. Goal status: IN PROGRESS  2.  The patient will improve Berg balance score to > or equal to 33/56. Baseline: 27/56 Goal status: IN PROGRESS  3.  The patient will improve gait speed to > or equal to 1.5 ft/sec. Baseline: 1.04 ft/sec Goal status: IN PROGRESS  4.  The patient will ambulate mod indep with RW x 300 ft to demo improved household mobility. Baseline: walks short distances in home Goal status: IN PROGRESS  5.  The patient will negotiate 4 steps with supervision with one handrail to improve access to home environment. Baseline: min A Goal status: IN PROGRESS  LONG TERM GOALS: Target date: 11/09/2023  The patient will be indep with HEP progression. Baseline:  no HEP Goal status: IN PROGRESS  2.  The patient will improve Berg score to > or equal to 38/56 to demo dec'ing risk for falls. Baseline: 27/56 Goal status: IN PROGRESS  3.  The patient will improve gait speed to > or equal to 2.0 ft/sec to demo dec'ing risk for falls and improved community access. Baseline: 1.04 ft/sec Goal status: IN PROGRESS  4.  The patient will negotiate household surfaces without a device mod  indep. Baseline:  Uses RW Goal status: IN PROGRESS  5.  The patient will report low back pain < or equal to 3/10 during standing activities x 10 minutes. Baseline: 6/10 today Goal status: IN PROGRESS  ASSESSMENT:  CLINICAL IMPRESSION: Patient arrived to session  s/p loop recorder placement on 09/28/22. Denies any movement restrictions since procedure. Worked on STS transfers with balance challenges with slight instability on foam surface and c/o fear of falling/hesitancy. Patient was able to demonstrate stair navigation with cane with good stability. Worked on gait training with patient's quad cane which appeared unsteady- patient also remarking on disliking this device. Trialed SPC and quad tip cane with improved stability and patient reporting preference for SPC. Remainder of session focused on dynamic balance activities. No complaints at end of session.   OBJECTIVE IMPAIRMENTS: Abnormal gait, decreased activity tolerance, decreased balance, decreased endurance, decreased ROM, decreased strength, impaired UE functional use, and pain.   ACTIVITY LIMITATIONS: bending, standing, squatting, stairs, transfers, bathing, dressing, reach over head, and locomotion level  PARTICIPATION LIMITATIONS: meal prep, cleaning, laundry, medication management, interpersonal relationship, driving, and community activity  PERSONAL FACTORS: 3+ comorbidities: HTN, CVA, low back surgery  are also affecting patient's functional outcome.   REHAB POTENTIAL: Good  CLINICAL DECISION MAKING: Stable/uncomplicated  EVALUATION COMPLEXITY: Low  PLAN:  PT FREQUENCY: 2x/week  PT DURATION: 8 weeks  PLANNED INTERVENTIONS: Therapeutic exercises, Therapeutic activity, Neuromuscular re-education, Balance training, Gait training, Patient/Family education, Self Care, Joint mobilization, Stair training, Visual/preceptual remediation/compensation, DME instructions, Aquatic Therapy, Dry Needling, Moist heat, Taping, Manual  therapy, and Re-evaluation  PLAN FOR NEXT SESSION: Establish HEP (only 1 exercise today) slowly, work on gait safety and mechanics (longer stride), standing balance, LE strength, and low back/core stabilization.  Janene Harvey, PT, DPT 10/08/22 2:00 PM  [facility] at [facility] [address], [place], [place] [address] Phone # [phone] Fax # [phone]    PHYSICAL THERAPY DISCHARGE SUMMARY  Visits from Start of Care: 3  Current functional level related to goals / functional outcomes: Unable to assess; patient did not return   Remaining deficits: Unable to assess   Education / Equipment: HEP  Plan: Patient agrees to discharge.  Patient goals were not met. Patient is being discharged due to not returning to PT.      Janene Harvey, PT, DPT 01/15/23 3:25 PM  [facility] at [facility] [address] [place], [place], [place] [address] Phone # [phone] Fax # [phone]

## 2022-10-03 NOTE — Telephone Encounter (Signed)
Patient is following up, stating that her entire surrounding area near ILR implant and breast are black and blue. She states the are is also swollen. She is scheduled for rehab at 12:15 PM, but is now concerned that that she should not go in this condition. [facility] for advisement, but no answer. Please advise.

## 2022-10-03 NOTE — Telephone Encounter (Signed)
Returned call to Pt.  She is concerned that the loop site is bruised.  She will come to office for a site check today.

## 2022-10-08 ENCOUNTER — Ambulatory Visit: Payer: PPO | Admitting: Physical Therapy

## 2022-10-08 ENCOUNTER — Other Ambulatory Visit: Payer: Self-pay | Admitting: Cardiovascular Disease

## 2022-10-08 ENCOUNTER — Encounter: Payer: Self-pay | Admitting: Physical Therapy

## 2022-10-08 DIAGNOSIS — I10 Essential (primary) hypertension: Secondary | ICD-10-CM

## 2022-10-08 DIAGNOSIS — R2689 Other abnormalities of gait and mobility: Secondary | ICD-10-CM

## 2022-10-08 DIAGNOSIS — R2681 Unsteadiness on feet: Secondary | ICD-10-CM

## 2022-10-08 DIAGNOSIS — M5459 Other low back pain: Secondary | ICD-10-CM

## 2022-10-08 DIAGNOSIS — M6281 Muscle weakness (generalized): Secondary | ICD-10-CM

## 2022-10-11 ENCOUNTER — Telehealth: Payer: Self-pay

## 2022-10-11 ENCOUNTER — Ambulatory Visit
Admission: RE | Admit: 2022-10-11 | Discharge: 2022-10-11 | Disposition: A | Discharge status: Home / Self Care | Payer: PPO | Source: Ambulatory Visit | Attending: Vascular Surgery | Admitting: Vascular Surgery

## 2022-10-11 DIAGNOSIS — I729 Aneurysm of unspecified site: Secondary | ICD-10-CM

## 2022-10-11 MED ORDER — IOPAMIDOL (ISOVUE-370) INJECTION 76%
60.0000 mL | Freq: Once | INTRAVENOUS | Status: AC | PRN
  Administered 2022-10-11: 60 mL via INTRAVENOUS

## 2022-10-11 NOTE — Telephone Encounter (Signed)
Rubi at [facility] called to confirm that CTA results were visible in EPIC. Confirmed. Staff msg sent to Dr. Stanford Breed, ordering provider, d/t impressions.

## 2022-10-11 NOTE — Therapy (Incomplete)
OUTPATIENT PHYSICAL THERAPY NEURO TREATMENT   Patient Name: Robin Arellano MRN: [id number] DOB:1950-12-04, 71 y.o., female Today's Date: 10/08/2022   PCP: Tawnya Crook, MD REFERRING PROVIDER: Kristeen Miss, MD   PT End of Session - 10/08/22 1358     Visit Number 4    Number of Visits 17    Date for PT Re-Evaluation 11/12/22    Authorization Type [employer]    Progress Note Due on Visit 10    PT Start Time 1320   pt late   PT Stop Time 1358    PT Time Calculation (min) 38 min    Equipment Utilized During Treatment Gait belt    Activity Tolerance Patient tolerated treatment well;Patient limited by pain    Behavior During Therapy WFL for tasks assessed/performed                Past Medical History:  Diagnosis Date   Allergy    Anemia    Anxiety    on meds   Back pain    Blood transfusion without reported diagnosis    Cataract    Chronic female pelvic pain    Chronic kidney disease    Coronary artery disease    mild, non-obstructive 11/2018   Depression    on meds   Family history of adverse reaction to anesthesia    sister had difficulty waking up   Fibromyalgia    H/O leukocytosis    Headache    Heart murmur    Hyperlipidemia    on meds   Hypertension    on meds   MI (myocardial infarction) ([place])    Pt states she did not have a MI- EKG was normal, was GERD   Osteoarthritis    on meds   Ovarian cyst, right    PONV (postoperative nausea and vomiting)    Post-operative nausea and vomiting    Stroke ([place]) 05/14/2022   L PCA   SVD (spontaneous vaginal delivery)    x 2   Vitamin D deficiency    Past Surgical History:  Procedure Laterality Date   ABDOMINAL HYSTERECTOMY  1994   TAH.BSO   ANTERIOR CERVICAL DECOMP/DISCECTOMY FUSION  2019   APPENDECTOMY  1975   BACK SURGERY  2023   BIOPSY  05/12/2020   Procedure: BIOPSY;  Surgeon: Milus Banister, MD;  Location: [facility];  Service: Endoscopy;;   CARDIAC CATHETERIZATION  2020    CHOLECYSTECTOMY N/A 07/21/2020   Procedure: LAPAROSCOPIC CHOLECYSTECTOMY WITH INTRAOPERATIVE CHOLANGIOGRAM;  Surgeon: [name], MD;  Location: [facility];  Service: General;  Laterality: N/A;   COLONOSCOPY  08/12/2017   Hx TA (piecemeal)Jacobs-MAC-suprep (good)   ESOPHAGOGASTRODUODENOSCOPY (EGD) WITH PROPOFOL N/A 05/12/2020   Procedure: ESOPHAGOGASTRODUODENOSCOPY (EGD) WITH PROPOFOL;  Surgeon: Milus Banister, MD;  Location: [facility];  Service: Endoscopy;  Laterality: N/A;   EUS N/A 05/12/2020   Procedure: UPPER ENDOSCOPIC ULTRASOUND (EUS) RADIAL;  Surgeon: Milus Banister, MD;  Location: [facility];  Service: Endoscopy;  Laterality: N/A;   KNEE SURGERY Bilateral 1996   x 2 - arthroscopic   LEFT HEART CATH AND CORONARY ANGIOGRAPHY N/A 11/25/2018   Procedure: LEFT HEART CATH AND CORONARY ANGIOGRAPHY;  Surgeon: Troy Sine, MD;  Location: [facility];  Service: Cardiovascular;  Laterality: N/A;   PELVIC LAPAROSCOPY  1989   W LYSIS OF ADHESIONS/L SALPINGONEOSTOMY   TUBAL LIGATION     WISDOM TOOTH EXTRACTION     Patient Active Problem List   Diagnosis  Date Noted   Nausea & vomiting 06/01/2022   Chronic kidney disease, stage 3b ([place]) 05/27/2022   Mixed hyperlipidemia 05/27/2022   Pressure injury of skin 05/27/2022   COVID-19 virus infection 05/26/2022   Hyponatremia    Cryptogenic stroke ([place]) 05/22/2022   Hypertensive emergency    Pseudoaneurysm ([place]) 05/14/2022   Spondylolisthesis at L4-L5 level 04/26/2022   Hyperlipidemia 11/25/2020   Essential hypertension 07/11/2020   Common bile duct dilation    Acute pancreatitis 04/01/2020   Anxiety    Hypokalemia    Mild renal insufficiency    Elevated troponin    Hypertensive urgency    Elevated liver enzymes    Abnormal magnetic resonance imaging of abdomen    Coronary artery disease involving native coronary artery of native heart without angina pectoris    Chest pain    Herpes zoster without complication  76/28/3151   Abdominal pain 02/07/2018   Abdominal pain, epigastric 02/07/2018   Low back pain 07/09/2013   Neck pain 07/09/2013   Headache 07/09/2013   Fibromyalgia 07/09/2013    ONSET DATE: 08/04/22  REFERRING DIAG: M54.16 (ICD-10-CM) - Radiculopathy, lumbar region   THERAPY DIAG:  Other abnormalities of gait and mobility  Muscle weakness (generalized)  Unsteadiness on feet  Other low back pain  Rationale for Evaluation and Treatment: Rehabilitation  SUBJECTIVE:                                                                                                                                                                                             SUBJECTIVE STATEMENT: Had a lot of bleeding after her loop recorder placement. Doing better now. Denies any movement restrictions since procedure.  Pt accompanied by: self  PERTINENT HISTORY: HTN, CVA, Stage III kidney disease, lumbar laminectomy, h/o cervical laminectomy, RA, fibromyalgia, pancreatitis, h/o frequent falls and vertigo  PAIN:  Are you having pain? Yes: NPRS scale: 6/10 Pain location: R>L LB Pain description: constant Aggravating factors: constant pain Relieving factors: constant in nature  PRECAUTIONS: Fall, loop recorder  WEIGHT BEARING RESTRICTIONS: No  FALLS: Has patient fallen in last 6 months? Yes, none since 1 major fall after surgery (around time of CVA)  PATIENT GOALS: Getting more independent, walking without device.  Has not been able to drive x months before surgery.   Goal to improve strength and balance.  OBJECTIVE:      TODAY'S TREATMENT: 10/12/22 Activity Comments                      HOME EXERCISE PROGRAM Last updated: 09/25/22 Access Code: [email or web address] URL: [email or web address] Date: 09/24/2022 Prepared by: [facility] -  Outpatient  Rehab - [facility]  Exercises - Sit to Stand with Counter Support  - 1 x daily - 5 x weekly - 2 sets - 10 reps - Seated Long  Arc Quad  - 1 x daily - 5 x weekly - 2 sets - 10 reps - Ice  - 1 x daily - 5 x weekly   Below measures were taken at time of initial evaluation unless otherwise specified:   DIAGNOSTIC FINDINGS: 1. L4-5 PLIF with bilateral laminectomies. Mild left neural foraminal stenosis. 2. No acute osseous abnormality.  COGNITION: Overall cognitive status: Impaired and she reports daughter and husband help with medication mgmt   SENSATION: Numbness and tingling in her feet  COORDINATION: WNLs and equal bilaterally  MUSCLE TONE: WNLs  POSTURE: rounded shoulders and forward head  UPPER EXTREMITY ROM:  bilateral shoulder AROM to 90 degrees flexion and abduction  LOWER EXTREMITY ROM:   WFLs.  LOWER EXTREMITY MMT:    MMT Right Eval Left Eval  Hip flexion 3/5 3/5  Hip extension    Hip abduction    Hip adduction    Hip internal rotation    Hip external rotation    Knee flexion 4-/5 4-/5  Knee extension 4/5 4/5  Ankle dorsiflexion 3/5 3/5  Ankle plantarflexion    Ankle inversion    Ankle eversion    (Blank rows = not tested)  BED MOBILITY:  Gets in and out of bed independently  TRANSFERS: Assistive device utilized: Environmental consultant - 2 wheeled  Sit to stand: Modified independence Stand to sit: Modified independence  STAIRS: Level of Assistance: Min A Stair Negotiation Technique: Alternating Pattern  with Bilateral Rails Number of Stairs: 2  Height of Stairs: 6"  Comments: Reports needing help at home for stairs  GAIT: Gait pattern: decreased step length- Right, decreased step length- Left, decreased stride length, decreased hip/knee flexion- Right, decreased hip/knee flexion- Left, decreased ankle dorsiflexion- Right, decreased ankle dorsiflexion- Left, decreased trunk rotation, narrow BOS, and poor foot clearance- Left Distance walked: 100 Assistive device utilized: Walker - 2 wheeled Level of assistance: CGA Comments: frequent L foot drag during gait, responds well with cues for  heel strike  FUNCTIONAL TESTS:  Berg=27/56 Gait speed= 1.04 ft/sec  BERG BALANCE TEST Sitting to Standing: 3.      Stands independently using hands Standing Unsupported: 4.      Stands safely for 2 minutes Sitting Unsupported: 4.     Sits for 2 minutes independently Standing to Sitting: 4.     Sits safely with minimal use of hands Transfers: 3.     Transfers safely definite use of hands Standing with eyes closed: 3.     Stands 10 seconds with supervision Standing with feet together: 3.     Stands for 1 minute with supervision Reaching forward with outstretched arm: 1.     Reaches forward with supervision Retrieving object from the floor: 0.     Unable/needs assistance to keep from falling Turning to look behind: 1.     Needs supervision when turning Turning 360 degrees: 0.     Needs assistance Place alternate foot on stool: 0.     Unable, needs assist to keep from falling Standing with one foot in front: 1.     Needs help to step, but can hold for 15 seconds Standing on one foot: 0.     Unable  Total Score: 27/56  PATIENT SURVEYS:  N/a-- CVA, also is s/p back surgery  TODAY'S  TREATMENT:                                                                                                                              DATE: 09/13/22    Sit to stand x 5 reps  PATIENT EDUCATION: Education details: HEP Person educated: Patient Education method: Explanation, Demonstration, and Handouts Education comprehension: verbalized understanding and returned demonstration  HOME EXERCISE PROGRAM: Access Code: [email or web address] URL: [email or web address] Date: 09/13/2022 Prepared by: Rudell Cobb  Exercises - Sit to Stand with Armchair  - 2 x daily - 7 x weekly - 1 sets - 5 reps  GOALS: Goals reviewed with patient? Yes  SHORT TERM GOALS: Target date: 10/11/2022  The patient will be indep with HEP. Goal status: IN PROGRESS  2.  The patient will improve Berg balance score to > or  equal to 33/56. Baseline: 27/56 Goal status: IN PROGRESS  3.  The patient will improve gait speed to > or equal to 1.5 ft/sec. Baseline: 1.04 ft/sec Goal status: IN PROGRESS  4.  The patient will ambulate mod indep with RW x 300 ft to demo improved household mobility. Baseline: walks short distances in home Goal status: IN PROGRESS  5.  The patient will negotiate 4 steps with supervision with one handrail to improve access to home environment. Baseline: min A Goal status: IN PROGRESS  LONG TERM GOALS: Target date: 11/09/2023  The patient will be indep with HEP progression. Baseline:  no HEP Goal status: IN PROGRESS  2.  The patient will improve Berg score to > or equal to 38/56 to demo dec'ing risk for falls. Baseline: 27/56 Goal status: IN PROGRESS  3.  The patient will improve gait speed to > or equal to 2.0 ft/sec to demo dec'ing risk for falls and improved community access. Baseline: 1.04 ft/sec Goal status: IN PROGRESS  4.  The patient will negotiate household surfaces without a device mod indep. Baseline:  Uses RW Goal status: IN PROGRESS  5.  The patient will report low back pain < or equal to 3/10 during standing activities x 10 minutes. Baseline: 6/10 today Goal status: IN PROGRESS  ASSESSMENT:  CLINICAL IMPRESSION: Patient arrived to session s/p loop recorder placement on 09/28/22. Denies any movement restrictions since procedure. Worked on STS transfers with balance challenges with slight instability on foam surface and c/o fear of falling/hesitancy. Patient was able to demonstrate stair navigation with cane with good stability. Worked on gait training with patient's quad cane which appeared unsteady- patient also remarking on disliking this device. Trialed SPC and quad tip cane with improved stability and patient reporting preference for SPC. Remainder of session focused on dynamic balance activities. No complaints at end of session.   OBJECTIVE IMPAIRMENTS:  Abnormal gait, decreased activity tolerance, decreased balance, decreased endurance, decreased ROM, decreased strength, impaired UE functional use, and pain.   ACTIVITY LIMITATIONS: bending, standing, squatting, stairs, transfers, bathing, dressing, reach over head, and locomotion level  PARTICIPATION  LIMITATIONS: meal prep, cleaning, laundry, medication management, interpersonal relationship, driving, and community activity  PERSONAL FACTORS: 3+ comorbidities: HTN, CVA, low back surgery  are also affecting patient's functional outcome.   REHAB POTENTIAL: Good  CLINICAL DECISION MAKING: Stable/uncomplicated  EVALUATION COMPLEXITY: Low  PLAN:  PT FREQUENCY: 2x/week  PT DURATION: 8 weeks  PLANNED INTERVENTIONS: Therapeutic exercises, Therapeutic activity, Neuromuscular re-education, Balance training, Gait training, Patient/Family education, Self Care, Joint mobilization, Stair training, Visual/preceptual remediation/compensation, DME instructions, Aquatic Therapy, Dry Needling, Moist heat, Taping, Manual therapy, and Re-evaluation  PLAN FOR NEXT SESSION: Establish HEP (only 1 exercise today) slowly, work on gait safety and mechanics (longer stride), standing balance, LE strength, and low back/core stabilization.  Janene Harvey, PT, DPT 10/08/22 2:00 PM  [facility] at [facility], [address], [place] [address] Phone # [phone] Fax # [phone]

## 2022-10-12 ENCOUNTER — Ambulatory Visit: Payer: PPO | Admitting: Physical Therapy

## 2022-10-16 ENCOUNTER — Ambulatory Visit (INDEPENDENT_AMBULATORY_CARE_PROVIDER_SITE_OTHER): Payer: PPO

## 2022-10-16 VITALS — Wt 134.0 lb

## 2022-10-16 DIAGNOSIS — Z0000 Encounter for general adult medical examination without abnormal findings: Secondary | ICD-10-CM | POA: Diagnosis not present

## 2022-10-16 NOTE — Progress Notes (Addendum)
I connected with  Robin Arellano on 10/16/22 by a audio enabled telemedicine application and verified that I am speaking with the correct person using two identifiers.  Patient Location: Home  Provider Location: Office/Clinic  I discussed the limitations of evaluation and management by telemedicine. The patient expressed understanding and agreed to proceed.  Subjective:   Robin Arellano is a 71 y.o. female who presents for an Initial Medicare Annual Wellness Visit.  Review of Systems     Cardiac Risk Factors include: advanced age (>52mn, >>73women);dyslipidemia;hypertension     Objective:    Today's Vitals   10/16/22 1415  Weight: 134 lb (60.8 kg)   Body mass index is 23.74 kg/m.     10/16/2022    2:20 PM 09/21/2022   11:27 AM 09/13/2022    2:17 PM 06/04/2022   10:00 PM 05/22/2022    1:13 PM 05/14/2022   10:52 AM 04/18/2022    2:30 PM  Advanced Directives  Does Patient Have a Medical Advance Directive? Yes No No Yes Yes Yes Yes  Type of [place]   Living will Living will Living will Living will  Does patient want to make changes to medical advance directive?    No - Patient declined No - Patient declined  No - Patient declined  Copy of HFriersonin Chart? No - copy requested        Would patient like information on creating a medical advance directive?  No - Patient declined Yes (MAU/Ambulatory/Procedural Areas - Information given) No - Patient declined       Current Medications (verified) Outpatient Encounter Medications as of 10/16/2022  Medication Sig   albuterol (VENTOLIN HFA) 108 (90 Base) MCG/ACT inhaler Inhale 2 puffs into the lungs every 4 (four) hours as needed for wheezing or shortness of breath.   ALPRAZolam (XANAX) 0.5 MG tablet TAKE 1 TABLET(0.5 MG) BY MOUTH TWICE DAILY AS NEEDED FOR SLEEP OR ANXIETY   amLODipine (NORVASC) 10 MG tablet TAKE 1 TABLET(10 MG) BY MOUTH DAILY   Ascorbic  Acid (VITAMIN C) 1000 MG tablet Take 1,000 mg by mouth in the morning.   aspirin EC 81 MG tablet Take 1 tablet (81 mg total) by mouth daily. Swallow whole.   butalbital-acetaminophen-caffeine (FIORICET) 50-325-40 MG tablet TAKE 1 TABLET BY MOUTH EVERY 6 HOURS AS NEEDED FOR HEADACHE OR MIGRAINE   Calcium Carbonate Antacid (TUMS PO) Take 1 tablet by mouth daily as needed (stomach pain).   Cholecalciferol (VITAMIN D) 50 MCG (2000 UT) tablet Take 2,000 Units by mouth in the morning.   cloNIDine (CATAPRES) 0.1 MG tablet TAKE 1 TABLET(0.1 MG) BY MOUTH TWICE DAILY   ezetimibe (ZETIA) 10 MG tablet TAKE 1 TABLET(10 MG) BY MOUTH DAILY   hydrALAZINE (APRESOLINE) 100 MG tablet TAKE 1 TABLET(100 MG) BY MOUTH THREE TIMES DAILY   HYDROcodone-acetaminophen (NORCO/VICODIN) 5-325 MG tablet take 1-2 tablets every 8 hours as needed for pain   methocarbamol (ROBAXIN) 500 MG tablet Take 1 tablet (500 mg total) by mouth every 6 (six) hours as needed for muscle spasms.   nystatin cream (MYCOSTATIN) Apply 1 Application topically 2 (two) times daily.   ondansetron (ZOFRAN) 4 MG tablet Take 1 tablet (4 mg total) by mouth every 4 (four) hours as needed for nausea or vomiting.   pantoprazole (PROTONIX) 40 MG tablet TAKE 1 TABLET(40 MG) BY MOUTH DAILY   polyethylene glycol powder (GLYCOLAX/MIRALAX) 17 GM/SCOOP powder Take 17 g by mouth daily  as needed.   Potassium Chloride ER 20 MEQ TBCR TAKE 1 TABLET BY MOUTH IN THE MORNING AND AT BEDTIME   pravastatin (PRAVACHOL) 40 MG tablet Take 1 tablet (40 mg total) by mouth every evening.   spironolactone (ALDACTONE) 25 MG tablet Take 25 mg by mouth daily.   UNABLE TO FIND Med Name: [employer] [place], twice a week   nystatin (MYCOSTATIN) 100000 UNIT/ML suspension Take 5 mLs (500,000 Units total) by mouth 4 (four) times daily. (Patient not taking: Reported on 10/16/2022)   valACYclovir (VALTREX) 500 MG tablet TAKE 1 TABLET BY MOUTH TWICE DAILY FOR 3 TO 5 DAYS THEN TAKE DAILY AS NEEDED (Patient  not taking: Reported on 10/16/2022)   [DISCONTINUED] cephALEXin (KEFLEX) 500 MG capsule Take 1 capsule (500 mg total) by mouth 4 (four) times daily.   No facility-administered encounter medications on file as of 10/16/2022.    Allergies (verified) Flexeril [cyclobenzaprine], Lidocaine, Zanaflex [tizanidine], Neurontin [gabapentin], and Latex   History: Past Medical History:  Diagnosis Date   Allergy    Anemia    Anxiety    on meds   Back pain    Blood transfusion without reported diagnosis    Cataract    Chronic female pelvic pain    Chronic kidney disease    Coronary artery disease    mild, non-obstructive 11/2018   Depression    on meds   Family history of adverse reaction to anesthesia    sister had difficulty waking up   Fibromyalgia    H/O leukocytosis    Headache    Heart murmur    Hyperlipidemia    on meds   Hypertension    on meds   MI (myocardial infarction) ([place])    Pt states she did not have a MI- EKG was normal, was GERD   Osteoarthritis    on meds   Ovarian cyst, right    PONV (postoperative nausea and vomiting)    Post-operative nausea and vomiting    Stroke ([place]) 05/14/2022   L PCA   SVD (spontaneous vaginal delivery)    x 2   Vitamin D deficiency    Past Surgical History:  Procedure Laterality Date   ABDOMINAL HYSTERECTOMY  1994   TAH.BSO   ANTERIOR CERVICAL DECOMP/DISCECTOMY FUSION  2019   APPENDECTOMY  1975   BACK SURGERY  2023   BIOPSY  05/12/2020   Procedure: BIOPSY;  Surgeon: Milus Banister, MD;  Location: [facility];  Service: Endoscopy;;   CARDIAC CATHETERIZATION  2020   CHOLECYSTECTOMY N/A 07/21/2020   Procedure: LAPAROSCOPIC CHOLECYSTECTOMY WITH INTRAOPERATIVE CHOLANGIOGRAM;  Surgeon: [name], MD;  Location: [facility];  Service: General;  Laterality: N/A;   COLONOSCOPY  08/12/2017   Hx TA (piecemeal)Jacobs-MAC-suprep (good)   ESOPHAGOGASTRODUODENOSCOPY (EGD) WITH PROPOFOL N/A 05/12/2020   Procedure: ESOPHAGOGASTRODUODENOSCOPY  (EGD) WITH PROPOFOL;  Surgeon: Milus Banister, MD;  Location: [facility];  Service: Endoscopy;  Laterality: N/A;   EUS N/A 05/12/2020   Procedure: UPPER ENDOSCOPIC ULTRASOUND (EUS) RADIAL;  Surgeon: Milus Banister, MD;  Location: [facility];  Service: Endoscopy;  Laterality: N/A;   KNEE SURGERY Bilateral 1996   x 2 - arthroscopic   LEFT HEART CATH AND CORONARY ANGIOGRAPHY N/A 11/25/2018   Procedure: LEFT HEART CATH AND CORONARY ANGIOGRAPHY;  Surgeon: Troy Sine, MD;  Location: [facility];  Service: Cardiovascular;  Laterality: N/A;   PELVIC LAPAROSCOPY  1989   W LYSIS OF ADHESIONS/L SALPINGONEOSTOMY   TUBAL LIGATION  WISDOM TOOTH EXTRACTION     Family History  Problem Relation Age of Onset   66 / [place] Mother    Early death Mother    Cancer Mother    Uterine cancer Mother 5   Heart attack Mother    Arthritis Father    Cancer Father    Aneurysm Father    Other Sister        MGUS    Other Sister        MA   COPD Brother    Skin cancer Brother    Skin cancer Brother    Heart disease Paternal Grandfather    Colon cancer Neg Hx    Rectal cancer Neg Hx    Stomach cancer Neg Hx    Stroke Neg Hx    Neuropathy Neg Hx    Colon polyps Neg Hx    Esophageal cancer Neg Hx    Social History   Socioeconomic History   Marital status: Married    Spouse name: don   Number of children: 2   Years of education: College   Highest education level: Not on file  Occupational History   Occupation: Cabin crew  Tobacco Use   Smoking status: Former    Packs/day: 0.15    Years: 20.00    Total pack years: 3.00    Types: Cigarettes    Quit date: 11/12/1998    Years since quitting: 23.9   Smokeless tobacco: Never  Vaping Use   Vaping Use: Never used  Substance and Sexual Activity   Alcohol use: Not Currently   Drug use: No   Sexual activity: Not Currently    Birth control/protection: Post-menopausal, Surgical    Comment: HYSTERECTOMY  Other Topics  Concern   Not on file  Social History Narrative   Lives at home with her husband Don   Right handed   Caffeine: unsweet tea, 2 glasses daily   Social Determinants of Health   Financial Resource Strain: Low Risk  (10/16/2022)   Overall Financial Resource Strain (CARDIA)    Difficulty of Paying Living Expenses: Not hard at all  Food Insecurity: No Food Insecurity (10/16/2022)   Hunger Vital Sign    Worried About Running Out of Food in the Last Year: Never true    Ran Out of Food in the Last Year: Never true  Transportation Needs: No Transportation Needs (10/16/2022)   PRAPARE - Hydrologist (Medical): No    Lack of Transportation (Non-Medical): No  Physical Activity: Insufficiently Active (10/16/2022)   Exercise Vital Sign    Days of Exercise per Week: 3 days    Minutes of Exercise per Session: 20 min  Stress: No Stress Concern Present (10/16/2022)   [place]    Feeling of Stress : Not at all  Social Connections: Moderately Isolated (10/16/2022)   Social Connection and Isolation Panel [NHANES]    Frequency of Communication with Friends and Family: More than three times a week    Frequency of Social Gatherings with Friends and Family: Twice a week    Attends Religious Services: Never    Marine scientist or Organizations: No    Attends Music therapist: Never    Marital Status: Married    Tobacco Counseling Counseling given: Not Answered   Clinical Intake:  Pre-visit preparation completed: Yes  Pain : No/denies pain     BMI - recorded: 23.74 Nutritional Status: BMI of  19-24  Normal Nutritional Risks: None Diabetes: No  How often do you need to have someone help you when you read instructions, pamphlets, or other written materials from your doctor or pharmacy?: 1 - Never  Diabetic?no  Interpreter Needed?: No  Information entered by :: Charlott Rakes,  LPN   Activities of Daily Living    10/16/2022    2:23 PM 10/15/2022    9:39 AM  In your present state of health, do you have any difficulty performing the following activities:  Hearing? 0 0  Vision? 0 0  Difficulty concentrating or making decisions? 0 0  Walking or climbing stairs? 0 0  Dressing or bathing? 1 1  Comment needs assistance   Doing errands, shopping? 0 1  Preparing Food and eating ? N N  Using the Toilet? N N  In the past six months, have you accidently leaked urine? Y Y  Comment at times wears a pad   Do you have problems with loss of bowel control? N N  Managing your Medications? N N  Managing your Finances? N N  Housekeeping or managing your Housekeeping? N Y    Patient Care Team: Tawnya Crook, MD as PCP - General (Family Medicine) Troy Sine, MD as PCP - Cardiology (Cardiology) Genella Mech, CCC-SLP as CIR Admissions Coordinator (Speech Pathology)  Indicate any recent Medical Services you may have received from other than [facility] providers in the past year (date may be approximate).     Assessment:   This is a routine wellness examination for [place].  Hearing/Vision screen Hearing Screening - Comments:: Pt denies any hearing  issues  Vision Screening - Comments:: Will find new provider   Dietary issues and exercise activities discussed: Current Exercise Habits: Home exercise routine, Type of exercise: Other - see comments, Time (Minutes): 20, Frequency (Times/Week): 3, Weekly Exercise (Minutes/Week): 60   Goals Addressed             This Visit's Progress    Patient Stated       None at this time        Depression Screen    10/16/2022    2:25 PM 07/04/2022   11:20 AM  PHQ 2/9 Scores  PHQ - 2 Score 2 6  PHQ- 9 Score 10 22    Fall Risk    10/16/2022    2:21 PM 10/15/2022    9:39 AM 07/04/2022   11:18 AM  Fall Risk   Falls in the past year? 1 0 1  Number falls in past yr: _0 Injury with Fall? 1 0 1  Risk for fall due to :  Impaired vision;Impaired balance/gait  History of fall(s)  Risk for fall due to: Comment uses cane    Follow up Falls prevention discussed  Education provided;Falls prevention discussed    FALL RISK PREVENTION PERTAINING TO THE HOME:  Any stairs in or around the home? Yes  If so, are there any without handrails? No  Home free of loose throw rugs in walkways, pet beds, electrical cords, etc? Yes  Adequate lighting in your home to reduce risk of falls? Yes   ASSISTIVE DEVICES UTILIZED TO PREVENT FALLS:  Life alert? No  Use of a cane, walker or w/c? Yes  Grab bars in the bathroom? Yes  Shower chair or bench in shower? Yes  Elevated toilet seat or a handicapped toilet? Yes   TIMED UP AND GO:  Was the test performed? No .  Cognitive Function:        10/16/2022    2:28 PM  6CIT Screen  What Year? 0 points  What month? 0 points  What time? 0 points  Count back from 20 0 points  Months in reverse 0 points  Repeat phrase 0 points  Total Score 0 points    Immunizations Immunization History  Administered Date(s) Administered   PFIZER(Purple Top)SARS-COV-2 Vaccination 01/18/2020, 02/15/2020    TDAP status: Due, Education has been provided regarding the importance of this vaccine. Advised may receive this vaccine at local pharmacy or Health Dept. Aware to provide a copy of the vaccination record if obtained from local pharmacy or Health Dept. Verbalized acceptance and understanding.  Flu Vaccine status: Declined, Education has been provided regarding the importance of this vaccine but patient still declined. Advised may receive this vaccine at local pharmacy or Health Dept. Aware to provide a copy of the vaccination record if obtained from local pharmacy or Health Dept. Verbalized acceptance and understanding.  Pneumococcal vaccine status: Declined,  Education has been provided regarding the importance of this vaccine but patient still declined. Advised may receive this vaccine  at local pharmacy or Health Dept. Aware to provide a copy of the vaccination record if obtained from local pharmacy or Health Dept. Verbalized acceptance and understanding.   Covid-19 vaccine status: Completed vaccines  Qualifies for Shingles Vaccine? Yes   Zostavax completed No   Shingrix Completed?: No.    Education has been provided regarding the importance of this vaccine. Patient has been advised to call insurance company to determine out of pocket expense if they have not yet received this vaccine. Advised may also receive vaccine at local pharmacy or Health Dept. Verbalized acceptance and understanding.  Screening Tests Health Maintenance  Topic Date Due   DTaP/Tdap/Td (1 - Tdap) Never done   COVID-19 Vaccine (3 - Pfizer risk series) 03/14/2020   Zoster Vaccines- Shingrix (1 of 2) 01/15/2023 (Originally 11/29/1969)   INFLUENZA VACCINE  02/10/2023 (Originally 06/12/2022)   Pneumonia Vaccine 77+ Years old (1 - PCV) 10/17/2023 (Originally 11/30/2015)   MAMMOGRAM  12/22/2022   Medicare Annual Wellness (AWV)  10/17/2023   COLONOSCOPY (Pts 45-94yr Insurance coverage will need to be confirmed)  03/29/2026   DEXA SCAN  Completed   Hepatitis C Screening  Completed   HPV VACCINES  Aged Out    Health Maintenance  Health Maintenance Due  Topic Date Due   DTaP/Tdap/Td (1 - Tdap) Never done   COVID-19 Vaccine (3 - Pfizer risk series) 03/14/2020    Colorectal cancer screening: Type of screening: Colonoscopy. Completed 03/29/21. Repeat every 5 years  Mammogram status: Completed 12/22/20. Repeat every year  Bone Density status: Completed 06/06/11. Results reflect: Bone density results: NORMAL. Repeat every 2 years.  Additional Screening:  Hepatitis C Screening:  Completed 04/04/20  Vision Screening: Recommended annual ophthalmology exams for early detection of glaucoma and other disorders of the eye. Is the patient up to date with their annual eye exam?  No  Who is the provider or what is  the name of the office in which the patient attends annual eye exams? Pt will follow up with provider  If pt is not established with a provider, would they like to be referred to a provider to establish care? No .   Dental Screening: Recommended annual dental exams for proper oral hygiene  Community Resource Referral / Chronic Care Management: CRR required this visit?  No   CCM required this visit?  No      Plan:     I have personally reviewed and noted the following in the patient's chart:   Medical and social history Use of alcohol, tobacco or illicit drugs  Current medications and supplements including opioid prescriptions. Patient is currently taking opioid prescriptions. Information provided to patient regarding non-opioid alternatives. Patient advised to discuss non-opioid treatment plan with their provider. Functional ability and status Nutritional status Physical activity Advanced directives List of other physicians Hospitalizations, surgeries, and ER visits in previous 12 months Vitals Screenings to include cognitive, depression, and falls Referrals and appointments  In addition, I have reviewed and discussed with patient certain preventive protocols, quality metrics, and best practice recommendations. A written personalized care plan for preventive services as well as general preventive health recommendations were provided to patient.     Willette Brace, LPN   23/01/[id number]   Nurse Notes: none

## 2022-10-16 NOTE — Patient Instructions (Signed)
Robin Arellano , Thank you for taking time to come for your Medicare Wellness Visit. I appreciate your ongoing commitment to your health goals. Please review the following plan we discussed and let me know if I can assist you in the future.   These are the goals we discussed:  Goals      Blood Pressure < 130/80        This is a list of the screening recommended for you and due dates:  Health Maintenance  Topic Date Due   DTaP/Tdap/Td vaccine (1 - Tdap) Never done   COVID-19 Vaccine (3 - Pfizer risk series) 03/14/2020   Zoster (Shingles) Vaccine (1 of 2) 01/15/2023*   Flu Shot  02/10/2023*   Pneumonia Vaccine (1 - PCV) 10/17/2023*   Mammogram  12/22/2022   Medicare Annual Wellness Visit  10/17/2023   Colon Cancer Screening  03/29/2026   DEXA scan (bone density measurement)  Completed   Hepatitis C Screening: USPSTF Recommendation to screen - Ages 53-79 yo.  Completed   HPV Vaccine  Aged Out  *Topic was postponed. The date shown is not the original due date.    Advanced directives: Please bring a copy of your health care power of attorney and living will to the office at your convenience.  Conditions/risks identified: none at this time   Next appointment: Follow up in one year for your annual wellness visit    Preventive Care 65 Years and Older, Female Preventive care refers to lifestyle choices and visits with your health care provider that can promote health and wellness. What does preventive care include? A yearly physical exam. This is also called an annual well check. Dental exams once or twice a year. Routine eye exams. Ask your health care provider how often you should have your eyes checked. Personal lifestyle choices, including: Daily care of your teeth and gums. Regular physical activity. Eating a healthy diet. Avoiding tobacco and drug use. Limiting alcohol use. Practicing safe sex. Taking low-dose aspirin every day. Taking vitamin and mineral supplements as  recommended by your health care provider. What happens during an annual well check? The services and screenings done by your health care provider during your annual well check will depend on your age, overall health, lifestyle risk factors, and family history of disease. Counseling  Your health care provider may ask you questions about your: Alcohol use. Tobacco use. Drug use. Emotional well-being. Home and relationship well-being. Sexual activity. Eating habits. History of falls. Memory and ability to understand (cognition). Work and work Statistician. Reproductive health. Screening  You may have the following tests or measurements: Height, weight, and BMI. Blood pressure. Lipid and cholesterol levels. These may be checked every 5 years, or more frequently if you are over 25 years old. Skin check. Lung cancer screening. You may have this screening every year starting at age 39 if you have a 30-pack-year history of smoking and currently smoke or have quit within the past 15 years. Fecal occult blood test (FOBT) of the stool. You may have this test every year starting at age 47. Flexible sigmoidoscopy or colonoscopy. You may have a sigmoidoscopy every 5 years or a colonoscopy every 10 years starting at age 95. Hepatitis C blood test. Hepatitis B blood test. Sexually transmitted disease (STD) testing. Diabetes screening. This is done by checking your blood sugar (glucose) after you have not eaten for a while (fasting). You may have this done every 1-3 years. Bone density scan. This is done to screen for  osteoporosis. You may have this done starting at age 33. Mammogram. This may be done every 1-2 years. Talk to your health care provider about how often you should have regular mammograms. Talk with your health care provider about your test results, treatment options, and if necessary, the need for more tests. Vaccines  Your health care provider may recommend certain vaccines, such  as: Influenza vaccine. This is recommended every year. Tetanus, diphtheria, and acellular pertussis (Tdap, Td) vaccine. You may need a Td booster every 10 years. Zoster vaccine. You may need this after age 54. Pneumococcal 13-valent conjugate (PCV13) vaccine. One dose is recommended after age 76. Pneumococcal polysaccharide (PPSV23) vaccine. One dose is recommended after age 68. Talk to your health care provider about which screenings and vaccines you need and how often you need them. This information is not intended to replace advice given to you by your health care provider. Make sure you discuss any questions you have with your health care provider. Document Released: 11/25/2015 Document Revised: 07/18/2016 Document Reviewed: 08/30/2015 Elsevier Interactive Patient Education  2017 [employer] Prevention in the Home Falls can cause injuries. They can happen to people of all ages. There are many things you can do to make your home safe and to help prevent falls. What can I do on the outside of my home? Regularly fix the edges of walkways and driveways and fix any cracks. Remove anything that might make you trip as you walk through a door, such as a raised step or threshold. Trim any bushes or trees on the path to your home. Use bright outdoor lighting. Clear any walking paths of anything that might make someone trip, such as rocks or tools. Regularly check to see if handrails are loose or broken. Make sure that both sides of any steps have handrails. Any raised decks and porches should have guardrails on the edges. Have any leaves, snow, or ice cleared regularly. Use sand or salt on walking paths during winter. Clean up any spills in your garage right away. This includes oil or grease spills. What can I do in the bathroom? Use night lights. Install grab bars by the toilet and in the tub and shower. Do not use towel bars as grab bars. Use non-skid mats or decals in the tub or  shower. If you need to sit down in the shower, use a plastic, non-slip stool. Keep the floor dry. Clean up any water that spills on the floor as soon as it happens. Remove soap buildup in the tub or shower regularly. Attach bath mats securely with double-sided non-slip rug tape. Do not have throw rugs and other things on the floor that can make you trip. What can I do in the bedroom? Use night lights. Make sure that you have a light by your bed that is easy to reach. Do not use any sheets or blankets that are too big for your bed. They should not hang down onto the floor. Have a firm chair that has side arms. You can use this for support while you get dressed. Do not have throw rugs and other things on the floor that can make you trip. What can I do in the kitchen? Clean up any spills right away. Avoid walking on wet floors. Keep items that you use a lot in easy-to-reach places. If you need to reach something above you, use a strong step stool that has a grab bar. Keep electrical cords out of the way. Do not  use floor polish or wax that makes floors slippery. If you must use wax, use non-skid floor wax. Do not have throw rugs and other things on the floor that can make you trip. What can I do with my stairs? Do not leave any items on the stairs. Make sure that there are handrails on both sides of the stairs and use them. Fix handrails that are broken or loose. Make sure that handrails are as long as the stairways. Check any carpeting to make sure that it is firmly attached to the stairs. Fix any carpet that is loose or worn. Avoid having throw rugs at the top or bottom of the stairs. If you do have throw rugs, attach them to the floor with carpet tape. Make sure that you have a light switch at the top of the stairs and the bottom of the stairs. If you do not have them, ask someone to add them for you. What else can I do to help prevent falls? Wear shoes that: Do not have high heels. Have  rubber bottoms. Are comfortable and fit you well. Are closed at the toe. Do not wear sandals. If you use a stepladder: Make sure that it is fully opened. Do not climb a closed stepladder. Make sure that both sides of the stepladder are locked into place. Ask someone to hold it for you, if possible. Clearly mark and make sure that you can see: Any grab bars or handrails. First and last steps. Where the edge of each step is. Use tools that help you move around (mobility aids) if they are needed. These include: Canes. Walkers. Scooters. Crutches. Turn on the lights when you go into a dark area. Replace any light bulbs as soon as they burn out. Set up your furniture so you have a clear path. Avoid moving your furniture around. If any of your floors are uneven, fix them. If there are any pets around you, be aware of where they are. Review your medicines with your doctor. Some medicines can make you feel dizzy. This can increase your chance of falling. Ask your doctor what other things that you can do to help prevent falls. This information is not intended to replace advice given to you by your health care provider. Make sure you discuss any questions you have with your health care provider. Document Released: 08/25/2009 Document Revised: 04/05/2016 Document Reviewed: 12/03/2014 Elsevier Interactive Patient Education  2017 [employer].

## 2022-10-29 ENCOUNTER — Other Ambulatory Visit: Payer: Self-pay

## 2022-10-29 DIAGNOSIS — I7025 Atherosclerosis of native arteries of other extremities with ulceration: Secondary | ICD-10-CM

## 2022-11-01 ENCOUNTER — Other Ambulatory Visit: Payer: Self-pay | Admitting: Family Medicine

## 2022-11-03 ENCOUNTER — Other Ambulatory Visit: Payer: Self-pay | Admitting: Family Medicine

## 2022-11-06 ENCOUNTER — Ambulatory Visit (INDEPENDENT_AMBULATORY_CARE_PROVIDER_SITE_OTHER): Payer: PPO

## 2022-11-06 DIAGNOSIS — I639 Cerebral infarction, unspecified: Secondary | ICD-10-CM | POA: Diagnosis not present

## 2022-11-06 LAB — CUP PACEART REMOTE DEVICE CHECK
Date Time Interrogation Session: 20231223131712
Implantable Pulse Generator Implant Date: 20231117

## 2022-11-07 ENCOUNTER — Ambulatory Visit (HOSPITAL_COMMUNITY): Payer: PPO

## 2022-11-09 ENCOUNTER — Other Ambulatory Visit: Payer: Self-pay | Admitting: Cardiovascular Disease

## 2022-11-13 ENCOUNTER — Ambulatory Visit (INDEPENDENT_AMBULATORY_CARE_PROVIDER_SITE_OTHER): Payer: PPO | Admitting: Vascular Surgery

## 2022-11-13 ENCOUNTER — Encounter: Payer: Self-pay | Admitting: Vascular Surgery

## 2022-11-13 VITALS — BP 143/73 | HR 55 | Temp 98.3°F | Resp 20 | Ht 63.0 in | Wt 134.0 lb

## 2022-11-13 DIAGNOSIS — I719 Aortic aneurysm of unspecified site, without rupture: Secondary | ICD-10-CM

## 2022-11-13 NOTE — Progress Notes (Signed)
[facility] [place] PROGRESS NOTE  ASSESSMENT / PLAN: Robin Arellano is a 72 y.o. 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  Overall the patient is doing well.  Her ulceration is asymptomatic.  Her blood pressure is reasonably well-controlled.  I will see her again in 6 months with a repeat CT angiogram  SUBJECTIVE: Well overall.  No complaints beyond her chronic lower back pain and sciatica.  We reviewed her recent CT angiogram.  OBJECTIVE: BP (!) 143/73 (BP Location: Left Arm, Patient Position: Sitting, Cuff Size: Normal)   Pulse (!) 55   Temp 98.3 F (36.8 C)   Resp 20   Ht '5\' 3"'$  (1.6 m)   Wt 134 lb (60.8 kg)   LMP  (LMP Unknown)   SpO2 97%   BMI 23.74 kg/m   Well-appearing woman in no acute distress Regular rate and rhythm Unlabored breathing Easily palpable radial and dorsalis pedis pulses      Latest Ref Rng & Units 09/21/2022   10:58 AM 09/06/2022    2:04 PM 08/03/2022    3:27 PM  CBC  WBC 4.0 - 10.5 K/uL 9.6  7.1  9.1   Hemoglobin 12.0 - 15.0 g/dL 9.9  8.6  9.8   Hematocrit 36.0 - 46.0 % 31.0  27.6  29.1   Platelets 150 - 400 K/uL 313  295  392.0         Latest Ref Rng & Units 09/06/2022    2:04 PM 07/24/2022    2:58 PM 07/13/2022    2:36 PM  CMP  Glucose 70 - 99 mg/dL 98  91  94   BUN 8 - 23 mg/dL '22  18  15   '$ Creatinine 0.44 - 1.00 mg/dL 1.67  1.50  1.47   Sodium 135 - 145 mmol/L 136  139  134   Potassium 3.5 - 5.1 mmol/L 3.9  4.6  4.3   Chloride 98 - 111 mmol/L 99  108  98   CO2 22 - 32 mmol/L '24  20  27   '$ Calcium 8.9 - 10.3 mg/dL 9.8  9.6  9.0   Total Protein 6.5 - 8.1 g/dL 7.5   6.7   Total Bilirubin 0.3 - 1.2 mg/dL 0.3   0.3   Alkaline Phos 38 - 126 U/L 85   141   AST 15 - 41 U/L 19   17   ALT 0 - 44 U/L 12   8     CrCl cannot be calculated (Patient's most recent lab result is older than the maximum 21 days allowed.).  CT angiogram 10/11/22: Overall  stable appearance of the descending thoracic aorta.  There is a PAU in the arch of the aorta that measures about 1 cm.  No aneurysmal degeneration.  No dissection.  [name]. Stanford Breed, MD [facility] Phone Number: [phone] 11/13/2022 3:37 PM

## 2022-11-16 ENCOUNTER — Telehealth: Payer: Self-pay | Admitting: Family Medicine

## 2022-11-16 ENCOUNTER — Other Ambulatory Visit: Payer: Self-pay | Admitting: Family Medicine

## 2022-11-16 NOTE — Telephone Encounter (Signed)
  LAST APPOINTMENT DATE:  09/05/22  NEXT APPOINTMENT DATE: 11/19/22  MEDICATION: ALPRAZolam (XANAX) 0.5 MG tablet   Is the patient out of medication? Yes  PHARMACY:  [facility] [phone] - [place], [place] - [address] [place] [address] [place] [address] Phone: [phone]  Fax: [phone]       Let patient know to contact pharmacy at the end of the day to make sure medication is ready.  Please notify patient to allow 48-72 hours to process

## 2022-11-16 NOTE — Telephone Encounter (Signed)
Patient notified. Patient stated that she is taking twice a day and a 15 day supply was sent in last time. Patient stated that she is trying to wean off, but cannot right now due to the heart monitor, but that is why she scheduled an appointment to discuss on Monday.

## 2022-11-19 ENCOUNTER — Ambulatory Visit (INDEPENDENT_AMBULATORY_CARE_PROVIDER_SITE_OTHER): Payer: PPO | Admitting: Family Medicine

## 2022-11-19 ENCOUNTER — Encounter: Payer: Self-pay | Admitting: Family Medicine

## 2022-11-19 VITALS — BP 162/69 | HR 55 | Temp 98.0°F | Resp 16 | Ht 63.0 in | Wt 136.6 lb

## 2022-11-19 DIAGNOSIS — G8929 Other chronic pain: Secondary | ICD-10-CM | POA: Diagnosis not present

## 2022-11-19 DIAGNOSIS — D500 Iron deficiency anemia secondary to blood loss (chronic): Secondary | ICD-10-CM | POA: Diagnosis not present

## 2022-11-19 DIAGNOSIS — M5441 Lumbago with sciatica, right side: Secondary | ICD-10-CM

## 2022-11-19 DIAGNOSIS — E876 Hypokalemia: Secondary | ICD-10-CM

## 2022-11-19 DIAGNOSIS — G44219 Episodic tension-type headache, not intractable: Secondary | ICD-10-CM | POA: Diagnosis not present

## 2022-11-19 DIAGNOSIS — K219 Gastro-esophageal reflux disease without esophagitis: Secondary | ICD-10-CM | POA: Diagnosis not present

## 2022-11-19 DIAGNOSIS — F419 Anxiety disorder, unspecified: Secondary | ICD-10-CM | POA: Diagnosis not present

## 2022-11-19 DIAGNOSIS — I10 Essential (primary) hypertension: Secondary | ICD-10-CM

## 2022-11-19 MED ORDER — BUTALBITAL-APAP-CAFFEINE 50-325-40 MG PO TABS: ORAL_TABLET | ORAL | 1 refills | Status: DC

## 2022-11-19 MED ORDER — HYDROCODONE-ACETAMINOPHEN 5-325 MG PO TABS: 1.0000 | ORAL_TABLET | Freq: Four times a day (QID) | ORAL | 0 refills | Status: DC | PRN

## 2022-11-19 MED ORDER — BUSPIRONE HCL 10 MG PO TABS: 10.0000 mg | ORAL_TABLET | Freq: Two times a day (BID) | ORAL | 1 refills | Status: DC

## 2022-11-19 NOTE — Progress Notes (Signed)
Subjective:     Patient ID: Robin Arellano, female    DOB: 24-Aug-1951, 72 y.o.   MRN: [id number]  Chief Complaint  Patient presents with   Headache    2-3 monthly   Sciatica    Right side down leg     HPI  HA 2-3/month-h/o migraine.  Stressed.  Taking 2 fioricet when HA Anxiety-taking xanax bid a lot of stress. Cymbalta-brain confusion.  No SI.  Nortrip-same issues.zoloft in past-knocked her out. Has tried lexapro-"insane". Paxil and prozac-tired, not concentrate.  [facility] worked some.  Xanax does just fine. .  Never tried BuSpar.  Sciatica down R-seeing pain mgmt-changing practices. Seeing Dr. Ellene Route on 1/26.  Going down whole leg now.  Hydro q 8h. Needs more often but stretches to 8 hrs. Out of meds as between practices. Hard to walk d/t pain. Robaxin at hs prn GERD-on protonix daily-holding. Occ gi pain HTN-chronic.  Up and down-seeing card  Health Maintenance Due  Topic Date Due   DTaP/Tdap/Td (1 - Tdap) Never done    Past Medical History:  Diagnosis Date   Allergy    Anemia    Anxiety    on meds   Back pain    Blood transfusion without reported diagnosis    Cataract    Chronic female pelvic pain    Chronic kidney disease    Coronary artery disease    mild, non-obstructive 11/2018   Depression    on meds   Family history of adverse reaction to anesthesia    sister had difficulty waking up   Fibromyalgia    H/O leukocytosis    Headache    Heart murmur    Hyperlipidemia    on meds   Hypertension    on meds   MI (myocardial infarction) ([place])    Pt states she did not have a MI- EKG was normal, was GERD   Osteoarthritis    on meds   Ovarian cyst, right    PONV (postoperative nausea and vomiting)    Post-operative nausea and vomiting    Stroke ([place]) 05/14/2022   L PCA   SVD (spontaneous vaginal delivery)    x 2   Vitamin D deficiency     Past Surgical History:  Procedure Laterality Date   ABDOMINAL HYSTERECTOMY  1994   TAH.BSO   ANTERIOR  CERVICAL DECOMP/DISCECTOMY FUSION  2019   APPENDECTOMY  1975   BACK SURGERY  2023   BIOPSY  05/12/2020   Procedure: BIOPSY;  Surgeon: Milus Banister, MD;  Location: [facility];  Service: Endoscopy;;   CARDIAC CATHETERIZATION  2020   CHOLECYSTECTOMY N/A 07/21/2020   Procedure: LAPAROSCOPIC CHOLECYSTECTOMY WITH INTRAOPERATIVE CHOLANGIOGRAM;  Surgeon: [name], MD;  Location: [facility];  Service: General;  Laterality: N/A;   COLONOSCOPY  08/12/2017   Hx TA (piecemeal)Jacobs-MAC-suprep (good)   ESOPHAGOGASTRODUODENOSCOPY (EGD) WITH PROPOFOL N/A 05/12/2020   Procedure: ESOPHAGOGASTRODUODENOSCOPY (EGD) WITH PROPOFOL;  Surgeon: Milus Banister, MD;  Location: [facility];  Service: Endoscopy;  Laterality: N/A;   EUS N/A 05/12/2020   Procedure: UPPER ENDOSCOPIC ULTRASOUND (EUS) RADIAL;  Surgeon: Milus Banister, MD;  Location: [facility];  Service: Endoscopy;  Laterality: N/A;   KNEE SURGERY Bilateral 1996   x 2 - arthroscopic   LEFT HEART CATH AND CORONARY ANGIOGRAPHY N/A 11/25/2018   Procedure: LEFT HEART CATH AND CORONARY ANGIOGRAPHY;  Surgeon: Troy Sine, MD;  Location: [facility];  Service: Cardiovascular;  Laterality: N/A;   PELVIC  LAPAROSCOPY  1989   W LYSIS OF ADHESIONS/L SALPINGONEOSTOMY   TUBAL LIGATION     WISDOM TOOTH EXTRACTION      Outpatient Medications Prior to Visit  Medication Sig Dispense Refill   albuterol (VENTOLIN HFA) 108 (90 Base) MCG/ACT inhaler Inhale 2 puffs into the lungs every 4 (four) hours as needed for wheezing or shortness of breath. 8 g 0   ALPRAZolam (XANAX) 0.5 MG tablet TAKE 1 TABLET(0.5 MG) BY MOUTH TWICE DAILY AS NEEDED FOR SLEEP OR ANXIETY 30 tablet 0   amLODipine (NORVASC) 10 MG tablet TAKE 1 TABLET(10 MG) BY MOUTH DAILY 90 tablet 3   Ascorbic Acid (VITAMIN C) 1000 MG tablet Take 1,000 mg by mouth in the morning.     aspirin EC 81 MG tablet Take 1 tablet (81 mg total) by mouth daily. Swallow whole. 90 tablet 3   Calcium Carbonate  Antacid (TUMS PO) Take 1 tablet by mouth daily as needed (stomach pain).     Cholecalciferol (VITAMIN D) 50 MCG (2000 UT) tablet Take 2,000 Units by mouth in the morning.     cloNIDine (CATAPRES) 0.1 MG tablet TAKE 1 TABLET(0.1 MG) BY MOUTH TWICE DAILY 90 tablet 3   ezetimibe (ZETIA) 10 MG tablet TAKE 1 TABLET(10 MG) BY MOUTH DAILY 90 tablet 3   hydrALAZINE (APRESOLINE) 100 MG tablet TAKE 1 TABLET(100 MG) BY MOUTH THREE TIMES DAILY 270 tablet 2   methocarbamol (ROBAXIN) 500 MG tablet Take 1 tablet (500 mg total) by mouth every 6 (six) hours as needed for muscle spasms. 30 tablet 3   nystatin (MYCOSTATIN) 100000 UNIT/ML suspension Take 5 mLs (500,000 Units total) by mouth 4 (four) times daily. 60 mL 0   nystatin cream (MYCOSTATIN) Apply 1 Application topically 2 (two) times daily. 30 g 1   ondansetron (ZOFRAN) 4 MG tablet Take 1 tablet (4 mg total) by mouth every 4 (four) hours as needed for nausea or vomiting. 60 tablet 3   pantoprazole (PROTONIX) 40 MG tablet TAKE 1 TABLET(40 MG) BY MOUTH DAILY 90 tablet 3   Potassium Chloride ER 20 MEQ TBCR TAKE 1 TABLET BY MOUTH IN THE MORNING AND AT BEDTIME 180 tablet 0   pravastatin (PRAVACHOL) 40 MG tablet Take 1 tablet (40 mg total) by mouth every evening. 90 tablet 2   spironolactone (ALDACTONE) 25 MG tablet Take 25 mg by mouth daily.     UNABLE TO FIND Med Name: [place], twice a week     valACYclovir (VALTREX) 500 MG tablet TAKE 1 TABLET BY MOUTH TWICE DAILY FOR 3 TO 5 DAYS THEN TAKE DAILY AS NEEDED 30 tablet 0   butalbital-acetaminophen-caffeine (FIORICET) 50-325-40 MG tablet TAKE 1 TABLET BY MOUTH EVERY 6 HOURS AS NEEDED FOR HEADACHE OR MIGRAINE 14 tablet 1   HYDROcodone-acetaminophen (NORCO/VICODIN) 5-325 MG tablet take 1-2 tablets every 8 hours as needed for pain     polyethylene glycol powder (GLYCOLAX/MIRALAX) 17 GM/SCOOP powder Take 17 g by mouth daily as needed. 238 g 0   No facility-administered medications prior to visit.    Allergies   Allergen Reactions   Flexeril [Cyclobenzaprine] Anaphylaxis, Hives, Itching and Swelling   Lidocaine Hives   Zanaflex [Tizanidine] Anaphylaxis, Hives, Itching and Swelling   Neurontin [Gabapentin] Itching and Swelling   Latex Rash   ROS neg/noncontributory except as noted HPI/below      Objective:     BP (!) 162/69   Pulse (!) 55   Temp 98 F (36.7 C) (Temporal)   Resp 16  Ht _0  (1.6 m)   Wt 136 lb 9.6 oz (62 kg)   LMP  (LMP Unknown)   SpO2 97%   BMI 24.20 kg/m  Wt Readings from Last 3 Encounters:  11/19/22 136 lb 9.6 oz (62 kg)  11/13/22 134 lb (60.8 kg)  10/16/22 134 lb (60.8 kg)    Physical Exam   Gen: [facility] NAD HEENT: NCAT, conjunctiva not injected, sclera nonicteric NECK:  supple, no thyromegaly, no nodes, no carotid bruits CARDIAC: RRR, S1S2+, + murmur. DP 2+B LUNGS: CTAB. No wheezes ABDOMEN:  BS+, soft, NTND, No HSM, no masses EXT:  no edema MSK: cane.  Has to sit/stand d/t pain  NEURO: A&O x3.  CN II-XII intact.  PSYCH: normal mood. Good eye contact  Nothing in PDMP(should be).  40 minutes was spent with patient, reviewing chart, reviewing labs, reviewing PDMP, discussing treatment, obtaining history of what she has tried.     Assessment & Plan:   Problem List Items Addressed This Visit       Cardiovascular and Mediastinum   Essential hypertension     Digestive   Gastroesophageal reflux disease without esophagitis     Other   Low back pain   Relevant Medications   butalbital-acetaminophen-caffeine (FIORICET) 50-325-40 MG tablet   HYDROcodone-acetaminophen (NORCO/VICODIN) 5-325 MG tablet   Headache   Relevant Medications   butalbital-acetaminophen-caffeine (FIORICET) 50-325-40 MG tablet   HYDROcodone-acetaminophen (NORCO/VICODIN) 5-325 MG tablet   Anxiety - Primary   Relevant Medications   busPIRone (BUSPAR) 10 MG tablet   Hypokalemia   Relevant Orders   Comprehensive metabolic panel   Magnesium   Other Visit Diagnoses     Iron  deficiency anemia due to chronic blood loss       Relevant Orders   CBC with Differential/Platelet     1.  Hypertension-chronic.  Intermittently controlled.  She is on multiple medications.  Being managed by cardiology. 2.  Headache-chronic.  Intermittent.  She takes Fioricet 2 tablets at onset of headache.  This works well for her.  Getting 2-3 headaches per month.  PDMP checked.  Refilled 3.  Sciatica right-chronic.  Worsened.  She sees neurosurg on 1/26.  She is changing pain management practices.  She is running out of hydrocodone.  Requesting a refill from [place].  She has been on it for 20 years.  Cannot just stop it.  Advised that I will fill the 5/325 mg tablets.  3 times daily as needed.  She needs to talk with Dr. Ellene Route about this long-term as I will not manage it long-term.  Also, she is trying to get established with another pain management. 4.  GERD-chronic.  Controlled on omeprazole 40 mg daily.  Continue. 5.  Hypokalemia-chronic.  On potassium supplements.  She also has some chronic kidney disease and take spironolactone as well.  Need to monitor potassium levels.  Check BMP. 6.  Anemia-chronic.  Managed by oncology.  Thought to be due to chronic kidney disease.  Patient is a bit concerned and does not have an appointment for a month.  Will check CBC 7.  Generalized anxiety disorder-a lot of medical problems, stress in her life.  She has tried multiple SSRIs, SNRI.  Bad side effects.  Xanax 0.5 mg daily to twice daily is working well.  Advised this is not the optimal way to control anxiety.  We will trial BuSpar 10 mg-a third of a tab daily then twice daily then increase as tolerated.  Will message when due for  Xanax as this is just been refilled.  Advised benzos and narcotics are contraindicated together.  Follow-up in 3 months.  Meds ordered this encounter  Medications   butalbital-acetaminophen-caffeine (FIORICET) 50-325-40 MG tablet    Sig: TAKE 1 TABLET BY MOUTH EVERY 6 HOURS AS  NEEDED FOR HEADACHE OR MIGRAINE    Dispense:  14 tablet    Refill:  1   HYDROcodone-acetaminophen (NORCO/VICODIN) 5-325 MG tablet    Sig: Take 1 tablet by mouth every 6 (six) hours as needed for moderate pain.    Dispense:  60 tablet    Refill:  0   busPIRone (BUSPAR) 10 MG tablet    Sig: Take 1 tablet (10 mg total) by mouth 2 (two) times daily.    Dispense:  60 tablet    Refill:  1    Wellington Hampshire, MD

## 2022-11-19 NOTE — Patient Instructions (Addendum)
It was very nice to see you today!  Try taking buspar for moods-1/3 tab in am for few days and if ok, then twice daily and then increase from there.   PLEASE NOTE:  If you had any lab tests please let us know if you have not heard back within a few days. You may see your results on MyChart before we have a chance to review them but we will give you a call once they are reviewed by [place]. If we ordered any referrals today, please let us know if you have not heard from their office within the next week.   Please try these tips to maintain a healthy lifestyle:  Eat most of your calories during the day when you are active. Eliminate processed foods including packaged sweets (pies, cakes, cookies), reduce intake of potatoes, white bread, white pasta, and white rice. Look for whole grain options, oat flour or almond flour.  Each meal should contain half fruits/vegetables, one quarter protein, and one quarter carbs (no bigger than a computer mouse).  Cut down on sweet beverages. This includes juice, soda, and sweet tea. Also watch fruit intake, though this is a healthier sweet option, it still contains natural sugar! Limit to 3 servings daily.  Drink at least 1 glass of water with each meal and aim for at least 8 glasses per day  Exercise at least 150 minutes every week.

## 2022-11-20 LAB — COMPREHENSIVE METABOLIC PANEL
ALT: 5 U/L (ref 0–35)
AST: 12 U/L (ref 0–37)
Albumin: 4.6 g/dL (ref 3.5–5.2)
Alkaline Phosphatase: 74 U/L (ref 39–117)
BUN: 26 mg/dL — ABNORMAL HIGH (ref 6–23)
CO2: 23 mEq/L (ref 19–32)
Calcium: 9.8 mg/dL (ref 8.4–10.5)
Chloride: 107 mEq/L (ref 96–112)
Creatinine, Ser: 1.77 mg/dL — ABNORMAL HIGH (ref 0.40–1.20)
GFR: 28.47 mL/min — ABNORMAL LOW (ref >60.00)
Glucose, Bld: 112 mg/dL — ABNORMAL HIGH (ref 70–99)
Potassium: 4.9 mEq/L (ref 3.5–5.1)
Sodium: 140 mEq/L (ref 135–145)
Total Bilirubin: 0.3 mg/dL (ref 0.2–1.2)
Total Protein: 7.6 g/dL (ref 6.0–8.3)

## 2022-11-20 LAB — CBC WITH DIFFERENTIAL/PLATELET
Basophils Absolute: 0.1 10*3/uL (ref 0.0–0.1)
Basophils Relative: 0.8 % (ref 0.0–3.0)
Eosinophils Absolute: 0.2 10*3/uL (ref 0.0–0.7)
Eosinophils Relative: 2.3 % (ref 0.0–5.0)
HCT: 33 % — ABNORMAL LOW (ref 36.0–46.0)
Hemoglobin: 10.9 g/dL — ABNORMAL LOW (ref 12.0–15.0)
Lymphocytes Relative: 22.5 % (ref 12.0–46.0)
Lymphs Abs: 2.1 10*3/uL (ref 0.7–4.0)
MCHC: 33.2 g/dL (ref 30.0–36.0)
MCV: 93.5 fl (ref 78.0–100.0)
Monocytes Absolute: 0.7 10*3/uL (ref 0.1–1.0)
Monocytes Relative: 7.8 % (ref 3.0–12.0)
Neutro Abs: 6.1 10*3/uL (ref 1.4–7.7)
Neutrophils Relative %: 66.6 % (ref 43.0–77.0)
Platelets: 335 10*3/uL (ref 150.0–400.0)
RBC: 3.53 Mil/uL — ABNORMAL LOW (ref 3.87–5.11)
RDW: 13.5 % (ref 11.5–15.5)
WBC: 9.2 10*3/uL (ref 4.0–10.5)

## 2022-11-20 LAB — MAGNESIUM: Magnesium: 2.2 mg/dL (ref 1.5–2.5)

## 2022-11-20 NOTE — Progress Notes (Signed)
She can decrease potassium to just one/day.   Is she still seeing kidney doctor?

## 2022-11-23 ENCOUNTER — Inpatient Hospital Stay: Payer: PPO | Admitting: Oncology

## 2022-11-23 ENCOUNTER — Inpatient Hospital Stay: Payer: PPO | Attending: Nurse Practitioner

## 2022-11-23 VITALS — BP 166/58 | HR 67 | Temp 98.1°F | Resp 18 | Ht 63.0 in | Wt 136.2 lb

## 2022-11-23 DIAGNOSIS — D631 Anemia in chronic kidney disease: Secondary | ICD-10-CM

## 2022-11-23 DIAGNOSIS — N189 Chronic kidney disease, unspecified: Secondary | ICD-10-CM | POA: Diagnosis not present

## 2022-11-23 DIAGNOSIS — D649 Anemia, unspecified: Secondary | ICD-10-CM | POA: Insufficient documentation

## 2022-11-23 DIAGNOSIS — I129 Hypertensive chronic kidney disease with stage 1 through stage 4 chronic kidney disease, or unspecified chronic kidney disease: Secondary | ICD-10-CM | POA: Diagnosis not present

## 2022-11-23 LAB — CBC WITH DIFFERENTIAL (CANCER CENTER ONLY)
Abs Immature Granulocytes: 0.02 10*3/uL (ref 0.00–0.07)
Basophils Absolute: 0.1 10*3/uL (ref 0.0–0.1)
Basophils Relative: 1 %
Eosinophils Absolute: 0.2 10*3/uL (ref 0.0–0.5)
Eosinophils Relative: 2 %
HCT: 34 % — ABNORMAL LOW (ref 36.0–46.0)
Hemoglobin: 11.2 g/dL — ABNORMAL LOW (ref 12.0–15.0)
Immature Granulocytes: 0 %
Lymphocytes Relative: 19 %
Lymphs Abs: 1.6 10*3/uL (ref 0.7–4.0)
MCH: 31.1 pg (ref 26.0–34.0)
MCHC: 32.9 g/dL (ref 30.0–36.0)
MCV: 94.4 fL (ref 80.0–100.0)
Monocytes Absolute: 0.6 10*3/uL (ref 0.1–1.0)
Monocytes Relative: 7 %
Neutro Abs: 6.1 10*3/uL (ref 1.7–7.7)
Neutrophils Relative %: 71 %
Platelet Count: 277 10*3/uL (ref 150–400)
RBC: 3.6 MIL/uL — ABNORMAL LOW (ref 3.87–5.11)
RDW: 13 % (ref 11.5–15.5)
WBC Count: 8.6 10*3/uL (ref 4.0–10.5)
nRBC: 0 % (ref 0.0–0.2)

## 2022-11-23 LAB — BASIC METABOLIC PANEL - CANCER CENTER ONLY
Anion gap: 13 (ref 5–15)
BUN: 28 mg/dL — ABNORMAL HIGH (ref 8–23)
CO2: 22 mmol/L (ref 22–32)
Calcium: 10.2 mg/dL (ref 8.9–10.3)
Chloride: 103 mmol/L (ref 98–111)
Creatinine: 1.79 mg/dL — ABNORMAL HIGH (ref 0.44–1.00)
GFR, Estimated: 30 mL/min — ABNORMAL LOW (ref >60)
Glucose, Bld: 104 mg/dL — ABNORMAL HIGH (ref 70–99)
Potassium: 4.2 mmol/L (ref 3.5–5.1)
Sodium: 138 mmol/L (ref 135–145)

## 2022-11-23 NOTE — Progress Notes (Signed)
  [place] OFFICE PROGRESS NOTE   Diagnosis: Anemia, renal insufficiency  INTERVAL HISTORY:   Robin Arellano returns as scheduled.  She reports malaise.  She has chronic low back pain.  She now has right-sided "sciatica ".  She is scheduled to see Dr. Ellene Route.  Objective:  Vital signs in last 24 hours:  Blood pressure (!) 166/58, pulse 67, temperature 98.1 F (36.7 C), temperature source Oral, resp. rate 18, height '5\' 3"'$  (1.6 m), weight 136 lb 3.2 oz (61.8 kg), SpO2 98 %.     Lymphatics: No cervical, supraclavicular, axillary, or inguinal nodes Resp: Distant breath sounds, lungs clear bilaterally Cardio: Regular rate and rhythm with premature beats GI: No hepatosplenomegaly Vascular: No leg edema  Lab Results:  Lab Results  Component Value Date   WBC 8.6 11/23/2022   HGB 11.2 (L) 11/23/2022   HCT 34.0 (L) 11/23/2022   MCV 94.4 11/23/2022   PLT 277 11/23/2022   NEUTROABS 6.1 11/23/2022    CMP  Lab Results  Component Value Date   NA 138 11/23/2022   K 4.2 11/23/2022   CL 103 11/23/2022   CO2 22 11/23/2022   GLUCOSE 104 (H) 11/23/2022   BUN 28 (H) 11/23/2022   CREATININE 1.79 (H) 11/23/2022   CALCIUM 10.2 11/23/2022   PROT 7.6 11/19/2022   ALBUMIN 4.6 11/19/2022   AST 12 11/19/2022   ALT 5 11/19/2022   ALKPHOS 74 11/19/2022   BILITOT 0.3 11/19/2022   GFRNONAA 30 (L) 11/23/2022   GFRAA 56 (L) 10/13/2020     Medications: I have reviewed the patient's current medications.   Assessment/Plan: Anemia 09/06/2022 SPEP/IFE-no M spike, polyclonal increase in 1 or more immunoglobulins, IgA mildly elevated, IgG and IgM normal; serum light chains kappa and lambda both elevated with normal ratio; erythropoietin mildly elevated; B12 normal; LDH normal; creatinine elevated at 1.67 Chronic kidney disease Hypertension History of stroke History of pancreatitis    Disposition: Robin Arellano has normocytic anemia.  The anemia is likely related to chronic  renal insufficiency.  There is no indication for erythropoietin therapy at present.  She will return for an office visit and CBC in 4 months.  Betsy Coder, MD  11/23/2022  11:49 AM

## 2022-11-26 ENCOUNTER — Other Ambulatory Visit: Payer: Self-pay | Admitting: *Deleted

## 2022-11-26 ENCOUNTER — Telehealth: Payer: Self-pay | Admitting: Family Medicine

## 2022-11-26 DIAGNOSIS — G8929 Other chronic pain: Secondary | ICD-10-CM

## 2022-11-26 NOTE — Telephone Encounter (Signed)
Referral placed.

## 2022-11-26 NOTE — Telephone Encounter (Signed)
Pt states she spoke with Dr Cherlynn Kaiser about a referral to pain management. She does want that referral to be sent. Please advise.

## 2022-12-03 ENCOUNTER — Other Ambulatory Visit: Payer: Self-pay | Admitting: Family Medicine

## 2022-12-03 ENCOUNTER — Encounter: Payer: Self-pay | Admitting: Family Medicine

## 2022-12-03 NOTE — Progress Notes (Signed)
Carelink Summary Report / Loop Recorder

## 2022-12-07 DIAGNOSIS — M4316 Spondylolisthesis, lumbar region: Secondary | ICD-10-CM | POA: Diagnosis not present

## 2022-12-10 ENCOUNTER — Other Ambulatory Visit: Payer: Self-pay | Admitting: Neurological Surgery

## 2022-12-10 ENCOUNTER — Ambulatory Visit: Payer: PPO

## 2022-12-10 DIAGNOSIS — I639 Cerebral infarction, unspecified: Secondary | ICD-10-CM

## 2022-12-10 DIAGNOSIS — M4316 Spondylolisthesis, lumbar region: Secondary | ICD-10-CM

## 2022-12-12 LAB — CUP PACEART REMOTE DEVICE CHECK
Date Time Interrogation Session: 20240127231012
Implantable Pulse Generator Implant Date: 20231117

## 2022-12-17 ENCOUNTER — Telehealth: Payer: Self-pay | Admitting: Family Medicine

## 2022-12-17 NOTE — Telephone Encounter (Signed)
Patient notified and verbalized understanding. 

## 2022-12-17 NOTE — Telephone Encounter (Signed)
  LAST APPOINTMENT DATE:  11/19/22  NEXT APPOINTMENT DATE:  MEDICATION: HYDROcodone-acetaminophen (NORCO/VICODIN) 5-325 MG tablet   Is the patient out of medication? Yes  PHARMACY:  [facility] [phone] - [place], [place] [address] OF [place] [address] Phone: [phone]  Fax: [phone]      Patient states she has appointment with Pain Management end of March 2024 if not sooner    Let patient know to contact pharmacy at the end of the day to make sure medication is ready.  Please notify patient to allow 48-72 hours to process

## 2022-12-24 NOTE — Progress Notes (Unsigned)
Cardiology Office Note:    Date:  12/27/2022   ID:  Robin Arellano, [place] 1951-04-24, MRN [id number]  PCP:  Tawnya Crook, MD   [place] Providers Cardiologist:  Shelva Majestic, MD {   Referring MD: Tawnya Crook, MD    History of Present Illness:    Robin Arellano is a 72 y.o. female with a hx of nonobstructive CAD on cath 2020, HTN on mult meds, HLD (familial) intol Crestor (on Zetia), Cspine surgery 2021, and low back pain who was previously followed by Dr. Claiborne Billings who now presents to clinic.   Per review of the record, the patient underwent coronary CTA in 2019 which showed moderate coronary plaque with moderate tubular atherosclerosis in the proximal RCA and mid RCA in the 50 to 69% range.  Left main coronary artery had mild plaque less than 25%.  Her LAD had mild proximal plaque in the 25 to 49% range, also had a moderate long segment of atherosclerotic plaque in the mid LAD just beyond the second diagonal vessel of 50 to 69%.  She was felt to have possible severe stenosis in the distal LAD with 70 to 99% plaque. She had a small caliber ramus intermediate vessel.  The circumflex had mild to moderate plaque of 25 to 49% proximally, and then 50 to 69% stenosis.  Subsequent FFR analysis not reveal any significant flow-limiting stenosis with the exception of the distal LAD where the FFR was 0.57. Cath 11/2018 with mild disease. TTE 05/16/22 with LVEF 60-65%, normal RV, no significant valve disease.   Was [facility] to [facility] in 05/15/22 after a fall with course complicated by hypertensive emergency. CT abd/pelvis showed 2 small penetrating atherosclerotic ulcers in the descending thoracic aorta. MRI brain showed acute/subacute left PCA infarct. She was managed conservatively by vascular surgery. Cardiology was consulted on that admission for management of HTN. She was discharged on amlodipine 38m daily, chlorthalidone 249mdaily, hydralazine 10066mID, clonidine 0.1 TID.  Coreg was stopped due to bradycardia.    Was readmitted 07/15-08/04/23 for COVID and UTI. Course complicated by metabolic encephalopathy and AKI on CKD. During that admission CTA negative for PE. Was declined for CIR and discharged home as she did not want to go to SNF.   Was  seen in clinic on 08/2022 where her blood pressure was overall better controlled on her current regimen. Was not tolerating lipitor and therefore she was changed to prava.  Was seen by Dr. TayLovena Le 09/28/22 for ILR placement for stroke.   Today, the patient states that fatigued which she is concerned may be related to the blood pressure medications and her chronic pain. Otherwise, she is doing well from a CV perspective. No chest pain, SOB, orthopnea, or PND. Continues to have headaches which has increased since her stroke, but blood pressure is significantly improved. Tolerating medications as prescribed. Systolic blood pressures are significantly improved to 130-140s at home. Notably she has not taken her clonidine yet during today's visit and BP is elevated.     Past Medical History:  Diagnosis Date   Allergy    Anemia    Anxiety    on meds   Back pain    Blood transfusion without reported diagnosis    Cataract    Chronic female pelvic pain    Chronic kidney disease    Coronary artery disease    mild, non-obstructive 11/2018   Depression    on meds   Family history of adverse reaction  to anesthesia    sister had difficulty waking up   Fibromyalgia    H/O leukocytosis    Headache    Heart murmur    Hyperlipidemia    on meds   Hypertension    on meds   MI (myocardial infarction) ([place])    Pt states she did not have a MI- EKG was normal, was GERD   Osteoarthritis    on meds   Ovarian cyst, right    PONV (postoperative nausea and vomiting)    Post-operative nausea and vomiting    Stroke ([place]) 05/14/2022   L PCA   SVD (spontaneous vaginal delivery)    x 2   Vitamin D deficiency     Past Surgical  History:  Procedure Laterality Date   ABDOMINAL HYSTERECTOMY  1994   TAH.BSO   ANTERIOR CERVICAL DECOMP/DISCECTOMY FUSION  2019   APPENDECTOMY  1975   BACK SURGERY  2023   BIOPSY  05/12/2020   Procedure: BIOPSY;  Surgeon: Milus Banister, MD;  Location: [facility];  Service: Endoscopy;;   CARDIAC CATHETERIZATION  2020   CHOLECYSTECTOMY N/A 07/21/2020   Procedure: LAPAROSCOPIC CHOLECYSTECTOMY WITH INTRAOPERATIVE CHOLANGIOGRAM;  Surgeon: [name], MD;  Location: [place];  Service: General;  Laterality: N/A;   COLONOSCOPY  08/12/2017   Hx TA (piecemeal)Jacobs-MAC-suprep (good)   ESOPHAGOGASTRODUODENOSCOPY (EGD) WITH PROPOFOL N/A 05/12/2020   Procedure: ESOPHAGOGASTRODUODENOSCOPY (EGD) WITH PROPOFOL;  Surgeon: Milus Banister, MD;  Location: [facility];  Service: Endoscopy;  Laterality: N/A;   EUS N/A 05/12/2020   Procedure: UPPER ENDOSCOPIC ULTRASOUND (EUS) RADIAL;  Surgeon: Milus Banister, MD;  Location: [facility];  Service: Endoscopy;  Laterality: N/A;   KNEE SURGERY Bilateral 1996   x 2 - arthroscopic   LEFT HEART CATH AND CORONARY ANGIOGRAPHY N/A 11/25/2018   Procedure: LEFT HEART CATH AND CORONARY ANGIOGRAPHY;  Surgeon: Troy Sine, MD;  Location: [place] [facility];  Service: Cardiovascular;  Laterality: N/A;   PELVIC LAPAROSCOPY  1989   W LYSIS OF ADHESIONS/L SALPINGONEOSTOMY   TUBAL LIGATION     WISDOM TOOTH EXTRACTION      Current Medications: Current Meds  Medication Sig   albuterol (VENTOLIN HFA) 108 (90 Base) MCG/ACT inhaler Inhale 2 puffs into the lungs every 4 (four) hours as needed for wheezing or shortness of breath.   ALPRAZolam (XANAX) 0.5 MG tablet TAKE 1 TABLET(0.5 MG) BY MOUTH TWICE DAILY AS NEEDED FOR SLEEP OR ANXIETY   amLODipine (NORVASC) 10 MG tablet TAKE 1 TABLET(10 MG) BY MOUTH DAILY   Ascorbic Acid (VITAMIN C) 1000 MG tablet Take 1,000 mg by mouth in the morning.   aspirin EC 81 MG tablet Take 1 tablet (81 mg total) by mouth daily. Swallow  whole.   butalbital-acetaminophen-caffeine (FIORICET) 50-325-40 MG tablet TAKE 1 TABLET BY MOUTH EVERY 6 HOURS AS NEEDED FOR HEADACHE OR MIGRAINE   Calcium Carbonate Antacid (TUMS PO) Take 1 tablet by mouth daily as needed (stomach pain).   Cholecalciferol (VITAMIN D) 50 MCG (2000 UT) tablet Take 2,000 Units by mouth in the morning.   cloNIDine (CATAPRES) 0.1 MG tablet TAKE 1 TABLET(0.1 MG) BY MOUTH TWICE DAILY   ezetimibe (ZETIA) 10 MG tablet TAKE 1 TABLET(10 MG) BY MOUTH DAILY   hydrALAZINE (APRESOLINE) 100 MG tablet TAKE 1 TABLET(100 MG) BY MOUTH THREE TIMES DAILY   HYDROcodone-acetaminophen (NORCO/VICODIN) 5-325 MG tablet Take 1 tablet by mouth every 6 (six) hours as needed for moderate pain.   methocarbamol (ROBAXIN) 500 MG tablet  Take 1 tablet (500 mg total) by mouth every 6 (six) hours as needed for muscle spasms.   ondansetron (ZOFRAN) 4 MG tablet Take 1 tablet (4 mg total) by mouth every 4 (four) hours as needed for nausea or vomiting.   pantoprazole (PROTONIX) 40 MG tablet TAKE 1 TABLET(40 MG) BY MOUTH DAILY   Potassium Chloride ER 20 MEQ TBCR TAKE 1 TABLET BY MOUTH IN THE MORNING AND AT BEDTIME   pravastatin (PRAVACHOL) 40 MG tablet Take 1 tablet (40 mg total) by mouth every evening.   spironolactone (ALDACTONE) 25 MG tablet Take 25 mg by mouth daily.   valACYclovir (VALTREX) 500 MG tablet TAKE 1 TABLET BY MOUTH TWICE DAILY FOR 3 TO 5 DAYS THEN TAKE DAILY AS NEEDED     Allergies:   Flexeril [cyclobenzaprine], Lidocaine, Zanaflex [tizanidine], Neurontin [gabapentin], and Latex   Social History   Socioeconomic History   Marital status: Married    Spouse name: don   Number of children: 2   Years of education: College   Highest education level: Not on file  Occupational History   Occupation: Realtor  Tobacco Use   Smoking status: Former    Packs/day: 0.15    Years: 20.00    Total pack years: 3.00    Types: Cigarettes    Quit date: 11/12/1998    Years since quitting: 24.1    Smokeless tobacco: Never  Vaping Use   Vaping Use: Never used  Substance and Sexual Activity   Alcohol use: Not Currently   Drug use: No   Sexual activity: Not Currently    Birth control/protection: Post-menopausal, Surgical    Comment: HYSTERECTOMY  Other Topics Concern   Not on file  Social History Narrative   Lives at home with her husband Don   Right handed   Caffeine: unsweet tea, 2 glasses daily   Social Determinants of Health   Financial Resource Strain: Low Risk  (10/16/2022)   Overall Financial Resource Strain (CARDIA)    Difficulty of Paying Living Expenses: Not hard at all  Food Insecurity: No Food Insecurity (10/16/2022)   Hunger Vital Sign    Worried About Running Out of Food in the Last Year: Never true    [place] in the Last Year: Never true  Transportation Needs: No Transportation Needs (10/16/2022)   PRAPARE - Hydrologist (Medical): No    Lack of Transportation (Non-Medical): No  Physical Activity: Insufficiently Active (10/16/2022)   Exercise Vital Sign    Days of Exercise per Week: 3 days    Minutes of Exercise per Session: 20 min  Stress: No Stress Concern Present (10/16/2022)   [place]    Feeling of Stress : Not at all  Social Connections: Moderately Isolated (10/16/2022)   Social Connection and Isolation Panel [NHANES]    Frequency of Communication with Friends and Family: More than three times a week    Frequency of Social Gatherings with Friends and Family: Twice a week    Attends Religious Services: Never    Marine scientist or Organizations: No    Attends Music therapist: Never    Marital Status: Married     Family History: The patient's family history includes Aneurysm in her father; Arthritis in her father; COPD in her brother; Cancer in her father and mother; Early death in her mother; Heart attack in her mother; Heart  disease in her paternal grandfather;  Miscarriages / Stillbirths in her mother; Other in her sister and sister; Skin cancer in her brother and brother; Uterine cancer (age of onset: 58) in her mother. There is no history of Colon cancer, Rectal cancer, Stomach cancer, Stroke, Neuropathy, Colon polyps, or Esophageal cancer.  ROS:   Review of Systems  Constitutional:  Positive for malaise/fatigue. Negative for chills and fever.  HENT:  Negative for ear pain and hearing loss.   Eyes:  Negative for blurred vision and pain.  Respiratory:  Negative for cough and shortness of breath.   Cardiovascular:  Negative for chest pain, palpitations, orthopnea, claudication, leg swelling and PND.  Gastrointestinal:  Negative for nausea and vomiting.  Genitourinary:  Negative for dysuria and hematuria.  Musculoskeletal:  Negative for joint pain and myalgias.  Neurological:  Positive for headaches. Negative for dizziness.  Psychiatric/Behavioral:  Negative for depression and substance abuse.      EKGs/Labs/Other Studies Reviewed:    The following studies were reviewed today:  LE Bilateral Venous Doppler 06/13/2022:  Summary:  BILATERAL:  - No evidence of deep vein thrombosis seen in the lower extremities,  bilaterally.  -No evidence of popliteal cyst, bilaterally.   Echo 05/14/22    1. Left ventricular ejection fraction, by estimation, is 60 to 65%. The  left ventricle has normal function. The left ventricle has no regional  wall motion abnormalities. There is mild concentric left ventricular  hypertrophy. Left ventricular diastolic  parameters are indeterminate.   2. Right ventricular systolic function is normal. The right ventricular  size is normal. There is normal pulmonary artery systolic pressure.   3. Right atrial size was mildly dilated.   4. The mitral valve is normal in structure. Trivial mitral valve  regurgitation. No evidence of mitral stenosis.   5. The aortic valve is normal in  structure. Aortic valve regurgitation is  not visualized. Aortic valve sclerosis is present, with no evidence of  aortic valve stenosis.   6. The inferior vena cava is normal in size with greater than 50%  respiratory variability, suggesting right atrial pressure of 3 mmHg.   Cath 11/2018: Prox RCA lesion is 20% stenosed. Mid RCA lesion is 20% stenosed. Ost 1st Mrg lesion is 25% stenosed. Prox LAD lesion is 25% stenosed. Mid LAD lesion is 20% stenosed. Dist LAD lesion is 20% stenosed. Mid LM lesion is 5% stenosed.   Mild multi-vessel nonobstructive CAD with narrowings no greater than 25% in the LAD, circumflex marginal, and dominant  RCA.   LVEDP 10 mm Hg.   RECOMMENDATION: Medical therapy.  Aggressive lipid-lowering therapy with target LDL less than 70.  The patient's chest pain has atypical features and is most likely nonischemic.  EKG:  No new tracing today  Recent Labs: 11/19/2022: ALT 5; Magnesium 2.2 11/23/2022: BUN 28; Creatinine 1.79; Hemoglobin 11.2; Platelet Count 277; Potassium 4.2; Sodium 138  Recent Lipid Panel    Component Value Date/Time   CHOL 139 09/28/2022 1156   TRIG 139 09/28/2022 1156   HDL 50 09/28/2022 1156   CHOLHDL 2.8 09/28/2022 1156   CHOLHDL 4.4 06/03/2022 0434   VLDL 54 (H) 06/03/2022 0434   LDLCALC 65 09/28/2022 1156     Risk Assessment/Calculations:           Physical Exam:    VS:  BP (!) 170/82   Pulse (!) 59   Ht 5' 3"$  (1.6 m)   Wt 140 lb 3.2 oz (63.6 kg)   LMP  (LMP Unknown)   SpO2 97%  BMI 24.84 kg/m     Wt Readings from Last 3 Encounters:  12/27/22 140 lb 3.2 oz (63.6 kg)  11/23/22 136 lb 3.2 oz (61.8 kg)  11/19/22 136 lb 9.6 oz (62 kg)     GEN: Comfortable, NAD HEENT: Normal NECK: No JVD; No carotid bruits CARDIAC: RRR, 1/6 systolic murmur. No rubs, gallops RESPIRATORY:  Clear to auscultation without rales, wheezing or rhonchi  ABDOMEN: Soft, non-tender, non-distended MUSCULOSKELETAL:  No edema; No deformity   SKIN: Warm and dry NEUROLOGIC:  Alert and oriented x 3 PSYCHIATRIC:  Normal affect   ASSESSMENT:    1. Essential hypertension   2. Cryptogenic stroke ([place])   3. Coronary artery disease involving native coronary artery of native heart without angina pectoris   4. Mixed hyperlipidemia   5. Cerebrovascular accident (CVA), unspecified mechanism ([place])   6. Hyperlipidemia with target LDL less than 70     PLAN:    In order of problems listed above:  #HTN: Significantly improved and mainly running 130s at home. Elevated today but missed her clonidine dose this afternoon. Will continue with medications as below and she will continue to monitor closely at home. -Continue amlodipine 61m daily -Continue hydralazine 1041mTID -Continue chlorthalidone 2570maily -Continue clonidine 0.1mg15mD  #PAD with Penetrating Ulcerations in the Descending Thoracic Aorta: Seen by vascular surgery. Stable on repeat imaging.  -Continue ASA 81mg30mly -Continue zetia 10mg 27my -Continue pravastatin 40mg d33m as did not tolerate the lipitor  #Prior CVA: Had PCA infarct noted on MRI head in 05/2022. ILR placed my Dr. Taylor Lovena Lenitoring.  -Continue ASA 81mg da39m-Continue zetia 10mg dai13mpravastatin 40mg dail20mCAD: Cath 11/2018 with mild nonobstructive disease  -Continue ASA 81mg daily1mntinue zetia 10mg daily 64mtinue pravastatin 40mg daily a76md not tolerate the lipitor  #HLD: -Continue zetia 10mg daily -C67mnue pravastatin 40mg daily as 54mnot tolerate the lipitor        Medication Adjustments/Labs and Tests Ordered: Current medicines are reviewed at length with the patient today.  Concerns regarding medicines are outlined above.   No orders of the defined types were placed in this encounter.  No orders of the defined types were placed in this encounter.  Patient Instructions  Medication Instructions:   Your physician recommends that you continue on your current  medications as directed. Please refer to the Current Medication list given to you today.  *If you need a refill on your cardiac medications before your next appointment, please call your pharmacy*    Follow-Up: [facility].  As part of our continuing mission to provide you with exceptional heart care, we have created designated Provider Care Teams.  These Care Teams include your primary Cardiologist (physician) and Advanced Practice Providers (APPs -  Physician Assistants and Nurse Practitioners) who all work together to provide you with the care you need, when you need it.  We recommend signing up for the patient portal called "MyChart".  Sign up information is provided on this After Visit Summary.  MyChart is used to connect with patients for Virtual Visits (Telemedicine).  Patients are able to view lab/test results, encounter notes, upcoming appointments, etc.  Non-urgent messages can be sent to your provider as well.   To learn more about what you can do with MyChart, go to [email or web address]:   6 month(s)  Provider:   DR. PEMBERTONJohney Frame  Signed, Freada Bergeron, MD  12/27/2022 3:18 PM    [place]

## 2022-12-25 DIAGNOSIS — G894 Chronic pain syndrome: Secondary | ICD-10-CM | POA: Diagnosis not present

## 2022-12-25 DIAGNOSIS — M25551 Pain in right hip: Secondary | ICD-10-CM | POA: Diagnosis not present

## 2022-12-25 DIAGNOSIS — Z79891 Long term (current) use of opiate analgesic: Secondary | ICD-10-CM | POA: Diagnosis not present

## 2022-12-25 DIAGNOSIS — M961 Postlaminectomy syndrome, not elsewhere classified: Secondary | ICD-10-CM | POA: Diagnosis not present

## 2022-12-25 DIAGNOSIS — M5416 Radiculopathy, lumbar region: Secondary | ICD-10-CM | POA: Diagnosis not present

## 2022-12-26 DIAGNOSIS — I714 Abdominal aortic aneurysm, without rupture, unspecified: Secondary | ICD-10-CM | POA: Diagnosis not present

## 2022-12-26 DIAGNOSIS — N1832 Chronic kidney disease, stage 3b: Secondary | ICD-10-CM | POA: Diagnosis not present

## 2022-12-26 DIAGNOSIS — N261 Atrophy of kidney (terminal): Secondary | ICD-10-CM | POA: Diagnosis not present

## 2022-12-26 DIAGNOSIS — I2510 Atherosclerotic heart disease of native coronary artery without angina pectoris: Secondary | ICD-10-CM | POA: Diagnosis not present

## 2022-12-26 DIAGNOSIS — D509 Iron deficiency anemia, unspecified: Secondary | ICD-10-CM | POA: Diagnosis not present

## 2022-12-26 DIAGNOSIS — I129 Hypertensive chronic kidney disease with stage 1 through stage 4 chronic kidney disease, or unspecified chronic kidney disease: Secondary | ICD-10-CM | POA: Diagnosis not present

## 2022-12-26 DIAGNOSIS — R809 Proteinuria, unspecified: Secondary | ICD-10-CM | POA: Diagnosis not present

## 2022-12-27 ENCOUNTER — Encounter: Payer: Self-pay | Admitting: Cardiology

## 2022-12-27 ENCOUNTER — Ambulatory Visit: Payer: PPO | Attending: Cardiology | Admitting: Cardiology

## 2022-12-27 VITALS — BP 170/82 | HR 59 | Ht 63.0 in | Wt 140.2 lb

## 2022-12-27 DIAGNOSIS — E782 Mixed hyperlipidemia: Secondary | ICD-10-CM | POA: Diagnosis not present

## 2022-12-27 DIAGNOSIS — E785 Hyperlipidemia, unspecified: Secondary | ICD-10-CM | POA: Diagnosis not present

## 2022-12-27 DIAGNOSIS — I639 Cerebral infarction, unspecified: Secondary | ICD-10-CM

## 2022-12-27 DIAGNOSIS — I10 Essential (primary) hypertension: Secondary | ICD-10-CM

## 2022-12-27 DIAGNOSIS — I2510 Atherosclerotic heart disease of native coronary artery without angina pectoris: Secondary | ICD-10-CM | POA: Diagnosis not present

## 2022-12-27 NOTE — Patient Instructions (Signed)
Medication Instructions:   Your physician recommends that you continue on your current medications as directed. Please refer to the Current Medication list given to you today.  *If you need a refill on your cardiac medications before your next appointment, please call your pharmacy*    Follow-Up: At [facility], you and your health needs are our priority.  As part of our continuing mission to provide you with exceptional heart care, we have created designated Provider Care Teams.  These Care Teams include your primary Cardiologist (physician) and Advanced Practice Providers (APPs -  Physician Assistants and Nurse Practitioners) who all work together to provide you with the care you need, when you need it.  We recommend signing up for the patient portal called "MyChart".  Sign up information is provided on this After Visit Summary.  MyChart is used to connect with patients for Virtual Visits (Telemedicine).  Patients are able to view lab/test results, encounter notes, upcoming appointments, etc.  Non-urgent messages can be sent to your provider as well.   To learn more about what you can do with MyChart, go to [email or web address].    Your next appointment:   6 month(s)  Provider:   DR. Johney Frame

## 2022-12-28 LAB — LAB REPORT - SCANNED
Creatinine, POC: 76.1 mg/dL
EGFR: 30
Protein/Creatinine Ratio: 222

## 2022-12-30 ENCOUNTER — Ambulatory Visit
Admission: RE | Admit: 2022-12-30 | Discharge: 2022-12-30 | Disposition: A | Discharge status: Home / Self Care | Payer: PPO | Source: Ambulatory Visit | Attending: Neurological Surgery | Admitting: Neurological Surgery

## 2022-12-30 DIAGNOSIS — M5117 Intervertebral disc disorders with radiculopathy, lumbosacral region: Secondary | ICD-10-CM | POA: Diagnosis not present

## 2022-12-30 DIAGNOSIS — M4316 Spondylolisthesis, lumbar region: Secondary | ICD-10-CM

## 2022-12-30 MED ORDER — GADOPICLENOL 0.5 MMOL/ML IV SOLN
6.0000 mL | Freq: Once | INTRAVENOUS | Status: AC | PRN
  Administered 2022-12-30: 6 mL via INTRAVENOUS

## 2023-01-01 ENCOUNTER — Other Ambulatory Visit: Payer: Self-pay | Admitting: Family Medicine

## 2023-01-02 ENCOUNTER — Other Ambulatory Visit: Payer: Self-pay | Admitting: Family Medicine

## 2023-01-03 ENCOUNTER — Other Ambulatory Visit: Payer: Self-pay | Admitting: Cardiovascular Disease

## 2023-01-09 DIAGNOSIS — M4316 Spondylolisthesis, lumbar region: Secondary | ICD-10-CM | POA: Diagnosis not present

## 2023-01-09 DIAGNOSIS — M5416 Radiculopathy, lumbar region: Secondary | ICD-10-CM | POA: Diagnosis not present

## 2023-01-14 ENCOUNTER — Ambulatory Visit (INDEPENDENT_AMBULATORY_CARE_PROVIDER_SITE_OTHER): Payer: PPO

## 2023-01-14 DIAGNOSIS — I639 Cerebral infarction, unspecified: Secondary | ICD-10-CM | POA: Diagnosis not present

## 2023-01-15 LAB — CUP PACEART REMOTE DEVICE CHECK
Date Time Interrogation Session: 20240303231458
Implantable Pulse Generator Implant Date: 20231117

## 2023-01-23 DIAGNOSIS — M961 Postlaminectomy syndrome, not elsewhere classified: Secondary | ICD-10-CM | POA: Diagnosis not present

## 2023-01-23 DIAGNOSIS — G894 Chronic pain syndrome: Secondary | ICD-10-CM | POA: Diagnosis not present

## 2023-01-23 DIAGNOSIS — G5701 Lesion of sciatic nerve, right lower limb: Secondary | ICD-10-CM | POA: Diagnosis not present

## 2023-01-23 DIAGNOSIS — M25551 Pain in right hip: Secondary | ICD-10-CM | POA: Diagnosis not present

## 2023-01-24 ENCOUNTER — Other Ambulatory Visit: Payer: Self-pay | Admitting: Family Medicine

## 2023-01-25 NOTE — Progress Notes (Signed)
Carelink Summary Report / Loop Recorder 

## 2023-01-30 ENCOUNTER — Other Ambulatory Visit: Payer: Self-pay | Admitting: Family Medicine

## 2023-02-11 ENCOUNTER — Other Ambulatory Visit: Payer: Self-pay

## 2023-02-11 DIAGNOSIS — I2510 Atherosclerotic heart disease of native coronary artery without angina pectoris: Secondary | ICD-10-CM

## 2023-02-11 DIAGNOSIS — E782 Mixed hyperlipidemia: Secondary | ICD-10-CM

## 2023-02-11 DIAGNOSIS — Z79899 Other long term (current) drug therapy: Secondary | ICD-10-CM

## 2023-02-11 DIAGNOSIS — I10 Essential (primary) hypertension: Secondary | ICD-10-CM

## 2023-02-11 DIAGNOSIS — E785 Hyperlipidemia, unspecified: Secondary | ICD-10-CM

## 2023-02-11 MED ORDER — PRAVASTATIN SODIUM 40 MG PO TABS: 40.0000 mg | ORAL_TABLET | Freq: Every evening | ORAL | 3 refills | Status: DC

## 2023-02-18 ENCOUNTER — Ambulatory Visit (INDEPENDENT_AMBULATORY_CARE_PROVIDER_SITE_OTHER): Payer: PPO

## 2023-02-18 DIAGNOSIS — I639 Cerebral infarction, unspecified: Secondary | ICD-10-CM | POA: Diagnosis not present

## 2023-02-18 LAB — CUP PACEART REMOTE DEVICE CHECK
Date Time Interrogation Session: 20240405230521
Implantable Pulse Generator Implant Date: 20231117

## 2023-02-25 NOTE — Progress Notes (Signed)
Carelink Summary Report / Loop Recorder 

## 2023-02-26 DIAGNOSIS — H2513 Age-related nuclear cataract, bilateral: Secondary | ICD-10-CM | POA: Diagnosis not present

## 2023-02-26 DIAGNOSIS — H353131 Nonexudative age-related macular degeneration, bilateral, early dry stage: Secondary | ICD-10-CM | POA: Diagnosis not present

## 2023-02-26 DIAGNOSIS — H53483 Generalized contraction of visual field, bilateral: Secondary | ICD-10-CM | POA: Diagnosis not present

## 2023-02-27 ENCOUNTER — Other Ambulatory Visit: Payer: Self-pay | Admitting: Family Medicine

## 2023-02-27 ENCOUNTER — Ambulatory Visit: Payer: PPO | Admitting: Neurology

## 2023-02-28 ENCOUNTER — Other Ambulatory Visit: Payer: Self-pay | Admitting: Family Medicine

## 2023-02-28 NOTE — Telephone Encounter (Signed)
Vml and mychart message sent

## 2023-03-13 DIAGNOSIS — M13842 Other specified arthritis, left hand: Secondary | ICD-10-CM | POA: Diagnosis not present

## 2023-03-13 DIAGNOSIS — M79644 Pain in right finger(s): Secondary | ICD-10-CM | POA: Diagnosis not present

## 2023-03-13 DIAGNOSIS — M65331 Trigger finger, right middle finger: Secondary | ICD-10-CM | POA: Diagnosis not present

## 2023-03-13 DIAGNOSIS — M65341 Trigger finger, right ring finger: Secondary | ICD-10-CM | POA: Diagnosis not present

## 2023-03-19 ENCOUNTER — Other Ambulatory Visit: Payer: Self-pay | Admitting: Family Medicine

## 2023-03-20 DIAGNOSIS — G894 Chronic pain syndrome: Secondary | ICD-10-CM | POA: Diagnosis not present

## 2023-03-20 DIAGNOSIS — M961 Postlaminectomy syndrome, not elsewhere classified: Secondary | ICD-10-CM | POA: Diagnosis not present

## 2023-03-20 DIAGNOSIS — G5701 Lesion of sciatic nerve, right lower limb: Secondary | ICD-10-CM | POA: Diagnosis not present

## 2023-03-20 DIAGNOSIS — M25551 Pain in right hip: Secondary | ICD-10-CM | POA: Diagnosis not present

## 2023-03-21 LAB — CUP PACEART REMOTE DEVICE CHECK
Date Time Interrogation Session: 20240508230953
Implantable Pulse Generator Implant Date: 20231117

## 2023-03-22 ENCOUNTER — Other Ambulatory Visit: Payer: PPO

## 2023-03-22 ENCOUNTER — Ambulatory Visit: Payer: PPO | Admitting: Oncology

## 2023-03-25 ENCOUNTER — Ambulatory Visit (INDEPENDENT_AMBULATORY_CARE_PROVIDER_SITE_OTHER): Payer: PPO | Admitting: Family Medicine

## 2023-03-25 ENCOUNTER — Ambulatory Visit (INDEPENDENT_AMBULATORY_CARE_PROVIDER_SITE_OTHER): Payer: PPO

## 2023-03-25 ENCOUNTER — Encounter: Payer: Self-pay | Admitting: Family Medicine

## 2023-03-25 VITALS — BP 150/70 | HR 68 | Temp 98.5°F | Resp 16 | Ht 64.0 in | Wt 143.1 lb

## 2023-03-25 DIAGNOSIS — F419 Anxiety disorder, unspecified: Secondary | ICD-10-CM | POA: Diagnosis not present

## 2023-03-25 DIAGNOSIS — R519 Headache, unspecified: Secondary | ICD-10-CM | POA: Diagnosis not present

## 2023-03-25 DIAGNOSIS — M2550 Pain in unspecified joint: Secondary | ICD-10-CM

## 2023-03-25 DIAGNOSIS — I639 Cerebral infarction, unspecified: Secondary | ICD-10-CM | POA: Diagnosis not present

## 2023-03-25 DIAGNOSIS — I10 Essential (primary) hypertension: Secondary | ICD-10-CM | POA: Diagnosis not present

## 2023-03-25 DIAGNOSIS — R700 Elevated erythrocyte sedimentation rate: Secondary | ICD-10-CM

## 2023-03-25 MED ORDER — ALPRAZOLAM 0.5 MG PO TABS: ORAL_TABLET | ORAL | 2 refills | Status: DC

## 2023-03-25 NOTE — Patient Instructions (Signed)
Stop the fioricet

## 2023-03-25 NOTE — Progress Notes (Signed)
Subjective:     Patient ID: Robin Arellano, female    DOB: 1951-08-31, 72 y.o.   MRN: [id number]  Chief Complaint  Patient presents with   Medication Follow-up    1 month follow-up on medications Need referral to rheumatology     HPI-patient late .  Here w/sister. 1  anxiety-taking xanax twice daily-"all that works"  tried buspar, mult SSRI, SNRI.  No SI 2   Migraine-2/month and takes 2 Fioricet  but getting headache(s) weekly so takes then as well. Taking tylenol  3.  HYPERTENSION-Pt is on amlodipine 10 mg, clonidine 0.1 mg bid, hydralazine 100 mg three times daily.  Spironolactone 1/2 tab .  Bp's running 130's/60's.  No ha/dizziness/cp/palp  Sees neph [facility] and [facility] 4.  Chronic pain-going to pain management-on robaxan 500 mg q 6hprn and hydrocodone 10/325. 1/2 three times daily.  Dr. Thyra Breed.  Sed rates 65-at neph.  Per patient-wants patient to see [facility].        5.  Pain in wrists-Dr. Amanda Pea. 6.  History of cerebrovascular accident-Dr Sethi-appointment delayed till Nov.  Has improved a lot   Health Maintenance Due  Topic Date Due   DTaP/Tdap/Td (1 - Tdap) Never done   MAMMOGRAM  12/22/2022    Past Medical History:  Diagnosis Date   Allergy    Anemia    Anxiety    on meds   Back pain    Blood transfusion without reported diagnosis    Cataract    Chronic female pelvic pain    Chronic kidney disease    Coronary artery disease    mild, non-obstructive 11/2018   Depression    on meds   Family history of adverse reaction to anesthesia    sister had difficulty waking up   Fibromyalgia    H/O leukocytosis    Headache    Heart murmur    Hyperlipidemia    on meds   Hypertension    on meds   MI (myocardial infarction) (HCC)    Pt states she did not have a MI- EKG was normal, was GERD   Osteoarthritis    on meds   Ovarian cyst, right    PONV (postoperative nausea and vomiting)    Post-operative nausea and vomiting    Stroke (HCC) 05/14/2022    L PCA   SVD (spontaneous vaginal delivery)    x 2   Vitamin D deficiency     Past Surgical History:  Procedure Laterality Date   ABDOMINAL HYSTERECTOMY  1994   TAH.BSO   ANTERIOR CERVICAL DECOMP/DISCECTOMY FUSION  2019   APPENDECTOMY  1975   BACK SURGERY  2023   BIOPSY  05/12/2020   Procedure: BIOPSY;  Surgeon: Rachael Fee, MD;  Location: [facility];  Service: Endoscopy;;   CARDIAC CATHETERIZATION  2020   CHOLECYSTECTOMY N/A 07/21/2020   Procedure: LAPAROSCOPIC CHOLECYSTECTOMY WITH INTRAOPERATIVE CHOLANGIOGRAM;  Surgeon: Almond Lint, MD;  [facility]: [facility];  Service: General;  Laterality: N/A;   COLONOSCOPY  08/12/2017   Hx TA (piecemeal)Jacobs-MAC-suprep (good)   ESOPHAGOGASTRODUODENOSCOPY (EGD) WITH PROPOFOL N/A 05/12/2020   Procedure: ESOPHAGOGASTRODUODENOSCOPY (EGD) WITH PROPOFOL;  Surgeon: Rachael Fee, MD;  [facility];  Service: Endoscopy;  Laterality: N/A;   EUS N/A 05/12/2020   Procedure: UPPER ENDOSCOPIC ULTRASOUND (EUS) RADIAL;  Surgeon: Rachael Fee, MD;  [facility];  Service: Endoscopy;  Laterality: N/A;   KNEE SURGERY Bilateral 1996   x 2 - arthroscopic   LEFT HEART  CATH AND CORONARY ANGIOGRAPHY N/A 11/25/2018   Procedure: LEFT HEART CATH AND CORONARY ANGIOGRAPHY;  Surgeon: Lennette Bihari, MD;  [facility];  Service: Cardiovascular;  Laterality: N/A;   PELVIC LAPAROSCOPY  1989   W LYSIS OF ADHESIONS/L SALPINGONEOSTOMY   TUBAL LIGATION     WISDOM TOOTH EXTRACTION       Current Outpatient Medications:    albuterol (VENTOLIN HFA) 108 (90 Base) MCG/ACT inhaler, Inhale 2 puffs into the lungs every 4 (four) hours as needed for wheezing or shortness of breath., Disp: 8 g, Rfl: 0   amLODipine (NORVASC) 10 MG tablet, TAKE 1 TABLET(10 MG) BY MOUTH DAILY, Disp: 90 tablet, Rfl: 3   Ascorbic Acid (VITAMIN C) 1000 MG tablet, Take 1,000 mg by mouth in the morning., Disp: , Rfl:    aspirin EC 81 MG tablet, Take 1 tablet (81  mg total) by mouth daily. Swallow whole., Disp: 90 tablet, Rfl: 3   Calcium Carbonate Antacid (TUMS PO), Take 1 tablet by mouth daily as needed (stomach pain)., Disp: , Rfl:    Cholecalciferol (VITAMIN D) 50 MCG (2000 UT) tablet, Take 2,000 Units by mouth in the morning., Disp: , Rfl:    cloNIDine (CATAPRES) 0.1 MG tablet, TAKE 1 TABLET(0.1 MG) BY MOUTH TWICE DAILY, Disp: 90 tablet, Rfl: 3   ezetimibe (ZETIA) 10 MG tablet, TAKE 1 TABLET(10 MG) BY MOUTH DAILY, Disp: 90 tablet, Rfl: 3   hydrALAZINE (APRESOLINE) 100 MG tablet, TAKE 1 TABLET(100 MG) BY MOUTH THREE TIMES DAILY, Disp: 270 tablet, Rfl: 2   HYDROcodone-acetaminophen (NORCO) 10-325 MG tablet, Take 1 tablet by mouth every 6 (six) hours as needed., Disp: , Rfl:    methocarbamol (ROBAXIN) 500 MG tablet, Take 1 tablet (500 mg total) by mouth every 6 (six) hours as needed for muscle spasms., Disp: 30 tablet, Rfl: 3   ondansetron (ZOFRAN) 4 MG tablet, TAKE 1 TABLET(4 MG) BY MOUTH EVERY 4 HOURS AS NEEDED FOR NAUSEA OR VOMITING, Disp: 90 tablet, Rfl: 1   pantoprazole (PROTONIX) 40 MG tablet, TAKE 1 TABLET(40 MG) BY MOUTH DAILY, Disp: 90 tablet, Rfl: 3   Potassium Chloride ER 20 MEQ TBCR, TAKE 1 TABLET BY MOUTH IN THE MORNING AND AT BEDTIME, Disp: 180 tablet, Rfl: 0   pravastatin (PRAVACHOL) 40 MG tablet, Take 1 tablet (40 mg total) by mouth every evening., Disp: 90 tablet, Rfl: 3   spironolactone (ALDACTONE) 25 MG tablet, Take 25 mg by mouth daily., Disp: , Rfl:    valACYclovir (VALTREX) 500 MG tablet, TAKE 1 TABLET BY MOUTH TWICE DAILY FOR 3 TO 5 DAYS THEN TAKE DAILY AS NEEDED, Disp: 30 tablet, Rfl: 0   ALPRAZolam (XANAX) 0.5 MG tablet, TAKE 1 TABLET(0.5 MG) BY MOUTH TWICE DAILY AS NEEDED FOR SLEEP OR ANXIETY, Disp: 60 tablet, Rfl: 2  Allergies  Allergen Reactions   Flexeril [Cyclobenzaprine] Anaphylaxis, Hives, Itching and Swelling   Lidocaine Hives   Zanaflex [Tizanidine] Anaphylaxis, Hives, Itching and Swelling   Neurontin [Gabapentin]  Itching and Swelling   Latex Rash   ROS neg/noncontributory except as noted HPI/below      Objective:     BP (!) 150/70   Pulse 68   Temp 98.5 F (36.9 C) (Temporal)   Resp 16   Ht 5\' 4"  (1.626 m)   Wt 143 lb 2 oz (64.9 kg)   LMP  (LMP Unknown)   SpO2 97%   BMI 24.57 kg/m  Wt Readings from Last 3 Encounters:  03/25/23 143 lb  2 oz (64.9 kg)  12/27/22 140 lb 3.2 oz (63.6 kg)  11/23/22 136 lb 3.2 oz (61.8 kg)    Physical Exam   Gen: WDWN NAD HEENT: NCAT, conjunctiva not injected, sclera nonicteric NECK:  supple, no thyromegaly, no nodes, no carotid bruits CARDIAC: RRR, S1S2+, 1/6 murmur.  LUNGS: CTAB. No wheezes ABDOMEN:  BS+, soft, NTND, No HSM, no masses EXT:  no edema MSK: no gross abnormalities.  NEURO: A&O x3.  CN II-XII intact.  PSYCH: normal mood. Good eye contact  Prescription drug monitoring program checked but nothing.  Patient probably under [employer] but can't access system to check.      Assessment & Plan:  Elevated sed rate -     Ambulatory referral to Rheumatology  Polyarthralgia -     Ambulatory referral to Rheumatology  Nonintractable episodic headache, unspecified headache type  Essential hypertension  Anxiety  Other orders -     ALPRAZolam; TAKE 1 TABLET(0.5 MG) BY MOUTH TWICE DAILY AS NEEDED FOR SLEEP OR ANXIETY  Dispense: 60 tablet; Refill: 2  1.  Elevated sed rate/polyarthralgia-per patient, last sed rate was 65.  Will sign release for labs.  [facility]. 2.  Hypertension-chronic.  Currently controlled.  Continue medications.  Managed by cardiology and nephrology 3.  Chronic headaches-advised she is on multiple high risk medications including Fioricet, hydrocodone, Xanax.  She has agreed to stop Fioricet.  Lady Gary, however she declined.  Not a candidate for Imitrex as she has CAD/CVA 4.  Anxiety disorder-chronic.  Mostly controlled on Xanax 0.5 mg twice daily.  We had a discussion that this is a high risk  medicine especially combined with hydrocodone.  However, she has tried multiple other medications without success. 5.  Chronic pain syndrome-managed by Dr. Vear Clock  Follow up 3 mo  Angelena Sole, MD

## 2023-03-26 DIAGNOSIS — N1832 Chronic kidney disease, stage 3b: Secondary | ICD-10-CM | POA: Diagnosis not present

## 2023-03-26 DIAGNOSIS — R809 Proteinuria, unspecified: Secondary | ICD-10-CM | POA: Diagnosis not present

## 2023-03-26 DIAGNOSIS — N261 Atrophy of kidney (terminal): Secondary | ICD-10-CM | POA: Diagnosis not present

## 2023-03-26 DIAGNOSIS — I129 Hypertensive chronic kidney disease with stage 1 through stage 4 chronic kidney disease, or unspecified chronic kidney disease: Secondary | ICD-10-CM | POA: Diagnosis not present

## 2023-03-26 DIAGNOSIS — I2510 Atherosclerotic heart disease of native coronary artery without angina pectoris: Secondary | ICD-10-CM | POA: Diagnosis not present

## 2023-03-26 DIAGNOSIS — I714 Abdominal aortic aneurysm, without rupture, unspecified: Secondary | ICD-10-CM | POA: Diagnosis not present

## 2023-03-26 DIAGNOSIS — D509 Iron deficiency anemia, unspecified: Secondary | ICD-10-CM | POA: Diagnosis not present

## 2023-03-26 DIAGNOSIS — R700 Elevated erythrocyte sedimentation rate: Secondary | ICD-10-CM | POA: Diagnosis not present

## 2023-03-27 LAB — LAB REPORT - SCANNED: EGFR: 35

## 2023-03-28 NOTE — Progress Notes (Signed)
Carelink Summary Report / Loop Recorder 

## 2023-04-02 ENCOUNTER — Other Ambulatory Visit: Payer: PPO

## 2023-04-02 ENCOUNTER — Ambulatory Visit: Payer: PPO | Admitting: Oncology

## 2023-04-03 ENCOUNTER — Ambulatory Visit: Payer: PPO | Admitting: Family Medicine

## 2023-04-03 ENCOUNTER — Other Ambulatory Visit: Payer: Self-pay | Admitting: Family Medicine

## 2023-04-03 ENCOUNTER — Encounter: Payer: Self-pay | Admitting: Family Medicine

## 2023-04-03 MED ORDER — BUTALBITAL-APAP-CAFFEINE 50-325-40 MG PO TABS: 1.0000 | ORAL_TABLET | Freq: Four times a day (QID) | ORAL | 2 refills | Status: DC | PRN

## 2023-04-11 ENCOUNTER — Inpatient Hospital Stay: Payer: PPO | Admitting: Oncology

## 2023-04-11 ENCOUNTER — Inpatient Hospital Stay: Payer: PPO | Attending: Oncology

## 2023-04-11 VITALS — BP 182/67 | HR 60 | Temp 98.2°F | Resp 16 | Wt 144.0 lb

## 2023-04-11 DIAGNOSIS — I129 Hypertensive chronic kidney disease with stage 1 through stage 4 chronic kidney disease, or unspecified chronic kidney disease: Secondary | ICD-10-CM | POA: Diagnosis not present

## 2023-04-11 DIAGNOSIS — N189 Chronic kidney disease, unspecified: Secondary | ICD-10-CM

## 2023-04-11 DIAGNOSIS — Z8673 Personal history of transient ischemic attack (TIA), and cerebral infarction without residual deficits: Secondary | ICD-10-CM | POA: Diagnosis not present

## 2023-04-11 DIAGNOSIS — D649 Anemia, unspecified: Secondary | ICD-10-CM | POA: Insufficient documentation

## 2023-04-11 DIAGNOSIS — M069 Rheumatoid arthritis, unspecified: Secondary | ICD-10-CM | POA: Diagnosis not present

## 2023-04-11 DIAGNOSIS — D631 Anemia in chronic kidney disease: Secondary | ICD-10-CM | POA: Diagnosis not present

## 2023-04-11 LAB — CMP (CANCER CENTER ONLY)
ALT: 7 U/L (ref 0–44)
AST: 15 U/L (ref 15–41)
Albumin: 4.5 g/dL (ref 3.5–5.0)
Alkaline Phosphatase: 106 U/L (ref 38–126)
Anion gap: 11 (ref 5–15)
BUN: 28 mg/dL — ABNORMAL HIGH (ref 8–23)
CO2: 20 mmol/L — ABNORMAL LOW (ref 22–32)
Calcium: 9.5 mg/dL (ref 8.9–10.3)
Chloride: 106 mmol/L (ref 98–111)
Creatinine: 1.73 mg/dL — ABNORMAL HIGH (ref 0.44–1.00)
GFR, Estimated: 31 mL/min — ABNORMAL LOW (ref >60)
Glucose, Bld: 95 mg/dL (ref 70–99)
Potassium: 4.1 mmol/L (ref 3.5–5.1)
Sodium: 137 mmol/L (ref 135–145)
Total Bilirubin: 0.3 mg/dL (ref 0.3–1.2)
Total Protein: 8.1 g/dL (ref 6.5–8.1)

## 2023-04-11 LAB — CBC WITH DIFFERENTIAL (CANCER CENTER ONLY)
Abs Immature Granulocytes: 0.02 10*3/uL (ref 0.00–0.07)
Basophils Absolute: 0.1 10*3/uL (ref 0.0–0.1)
Basophils Relative: 1 %
Eosinophils Absolute: 0.1 10*3/uL (ref 0.0–0.5)
Eosinophils Relative: 1 %
HCT: 33.4 % — ABNORMAL LOW (ref 36.0–46.0)
Hemoglobin: 11 g/dL — ABNORMAL LOW (ref 12.0–15.0)
Immature Granulocytes: 0 %
Lymphocytes Relative: 20 %
Lymphs Abs: 1.5 10*3/uL (ref 0.7–4.0)
MCH: 31.6 pg (ref 26.0–34.0)
MCHC: 32.9 g/dL (ref 30.0–36.0)
MCV: 96 fL (ref 80.0–100.0)
Monocytes Absolute: 0.6 10*3/uL (ref 0.1–1.0)
Monocytes Relative: 8 %
Neutro Abs: 5.5 10*3/uL (ref 1.7–7.7)
Neutrophils Relative %: 70 %
Platelet Count: 266 10*3/uL (ref 150–400)
RBC: 3.48 MIL/uL — ABNORMAL LOW (ref 3.87–5.11)
RDW: 13.2 % (ref 11.5–15.5)
WBC Count: 7.8 10*3/uL (ref 4.0–10.5)
nRBC: 0 % (ref 0.0–0.2)

## 2023-04-11 NOTE — Progress Notes (Signed)
  [facility] OFFICE PROGRESS NOTE   Diagnosis: Anemia  INTERVAL HISTORY:   Robin Arellano returns as scheduled.  She reports malaise.  Good appetite.  No difficulty with bowel function.  No bleeding.  She has arthritis pain in the hands.  She reports being diagnosed with rheumatoid arthritis in approximately 2010.  She has not been treated for rheumatoid arthritis.  Objective:  Vital signs in last 24 hours:  Blood pressure (!) 182/67, pulse 60, temperature 98.2 F (36.8 C), temperature source Oral, resp. rate 16, weight 144 lb (65.3 kg), SpO2 100 %.    Lymphatics: No cervical, supraclavicular, axillary, or inguinal nodes Resp: Lungs clear bilaterally Cardio: Regular rate and rhythm GI: No hepatosplenomegaly, no mass, nontender Vascular: No leg edema Musculoskeletal: Joint deformity at the left first MTP joint   Lab Results:  Lab Results  Component Value Date   WBC 7.8 04/11/2023   HGB 11.0 (L) 04/11/2023   HCT 33.4 (L) 04/11/2023   MCV 96.0 04/11/2023   PLT 266 04/11/2023   NEUTROABS 5.5 04/11/2023    CMP  Lab Results  Component Value Date   NA 138 11/23/2022   K 4.2 11/23/2022   CL 103 11/23/2022   CO2 22 11/23/2022   GLUCOSE 104 (H) 11/23/2022   BUN 28 (H) 11/23/2022   CREATININE 1.79 (H) 11/23/2022   CALCIUM 10.2 11/23/2022   PROT 7.6 11/19/2022   ALBUMIN 4.6 11/19/2022   AST 12 11/19/2022   ALT 5 11/19/2022   ALKPHOS 74 11/19/2022   BILITOT 0.3 11/19/2022   GFRNONAA 30 (L) 11/23/2022   GFRAA 56 (L) 10/13/2020    Medications: I have reviewed the patient's current medications.   Assessment/Plan: Anemia 09/06/2022 SPEP/IFE-no M spike, polyclonal increase in 1 or more immunoglobulins, IgA mildly elevated, IgG and IgM normal; serum light chains kappa and lambda both elevated with normal ratio; erythropoietin mildly elevated; B12 normal; LDH normal; creatinine elevated at 1.67 Chronic kidney disease Hypertension History of stroke History  of pancreatitis Rheumatoid arthritis    Disposition: Robin Arellano has persistent mild anemia.  I suspect the anemia is secondary to chronic renal insufficiency and the anemia of chronic inflammation.  She reports a history of rheumatoid arthritis and the erythrocyte sedimentation rate is chronically elevated.  There is no indication for erythropoietin therapy at present.  She will return for an office visit and CBC in 6 months.  She plans to schedule an appointment with rheumatology to consider treatment of the rheumatoid arthritis.  Thornton Papas, MD  04/11/2023  3:38 PM

## 2023-04-17 DIAGNOSIS — G894 Chronic pain syndrome: Secondary | ICD-10-CM | POA: Diagnosis not present

## 2023-04-17 DIAGNOSIS — M25551 Pain in right hip: Secondary | ICD-10-CM | POA: Diagnosis not present

## 2023-04-17 DIAGNOSIS — M961 Postlaminectomy syndrome, not elsewhere classified: Secondary | ICD-10-CM | POA: Diagnosis not present

## 2023-04-17 DIAGNOSIS — G5701 Lesion of sciatic nerve, right lower limb: Secondary | ICD-10-CM | POA: Diagnosis not present

## 2023-04-18 ENCOUNTER — Other Ambulatory Visit: Payer: Self-pay

## 2023-04-18 DIAGNOSIS — I719 Aortic aneurysm of unspecified site, without rupture: Secondary | ICD-10-CM

## 2023-04-22 NOTE — Progress Notes (Signed)
Carelink Summary Report / Loop Recorder 

## 2023-04-24 ENCOUNTER — Telehealth: Payer: Self-pay

## 2023-04-24 DIAGNOSIS — M65341 Trigger finger, right ring finger: Secondary | ICD-10-CM | POA: Diagnosis not present

## 2023-04-24 DIAGNOSIS — M65331 Trigger finger, right middle finger: Secondary | ICD-10-CM | POA: Diagnosis not present

## 2023-04-24 DIAGNOSIS — M65351 Trigger finger, right little finger: Secondary | ICD-10-CM | POA: Diagnosis not present

## 2023-04-24 LAB — CUP PACEART REMOTE DEVICE CHECK
Date Time Interrogation Session: 20240610230358
Implantable Pulse Generator Implant Date: 20231117

## 2023-04-24 NOTE — Telephone Encounter (Signed)
Pt called unsure of why [facility] was calling to schedule her CT. She is due for 6 month repeat CT and to see MD after. Pt did not realize this was the plan. All questions have been answered and she will call GI back to schedule her CT.

## 2023-04-29 ENCOUNTER — Ambulatory Visit (INDEPENDENT_AMBULATORY_CARE_PROVIDER_SITE_OTHER): Payer: PPO

## 2023-04-29 DIAGNOSIS — I639 Cerebral infarction, unspecified: Secondary | ICD-10-CM

## 2023-05-03 ENCOUNTER — Other Ambulatory Visit: Payer: PPO

## 2023-05-06 ENCOUNTER — Other Ambulatory Visit: Payer: Self-pay | Admitting: Family Medicine

## 2023-05-06 ENCOUNTER — Ambulatory Visit
Admission: RE | Admit: 2023-05-06 | Discharge: 2023-05-06 | Disposition: A | Discharge status: Home / Self Care | Payer: PPO | Source: Ambulatory Visit | Attending: Vascular Surgery | Admitting: Vascular Surgery

## 2023-05-06 DIAGNOSIS — I719 Aortic aneurysm of unspecified site, without rupture: Secondary | ICD-10-CM

## 2023-05-06 MED ORDER — IOPAMIDOL (ISOVUE-370) INJECTION 76%
75.0000 mL | Freq: Once | INTRAVENOUS | Status: AC | PRN
  Administered 2023-05-06: 75 mL via INTRAVENOUS

## 2023-05-09 ENCOUNTER — Other Ambulatory Visit: Payer: Self-pay | Admitting: Cardiovascular Disease

## 2023-05-09 DIAGNOSIS — I10 Essential (primary) hypertension: Secondary | ICD-10-CM

## 2023-05-13 ENCOUNTER — Other Ambulatory Visit: Payer: Self-pay | Admitting: Family Medicine

## 2023-05-14 ENCOUNTER — Other Ambulatory Visit: Payer: Self-pay | Admitting: *Deleted

## 2023-05-14 MED ORDER — ONDANSETRON HCL 4 MG PO TABS: ORAL_TABLET | ORAL | 1 refills | Status: DC

## 2023-05-21 NOTE — Progress Notes (Signed)
Carelink Summary Report / Loop Recorder 

## 2023-05-27 ENCOUNTER — Ambulatory Visit (INDEPENDENT_AMBULATORY_CARE_PROVIDER_SITE_OTHER): Payer: PPO

## 2023-05-27 DIAGNOSIS — I639 Cerebral infarction, unspecified: Secondary | ICD-10-CM

## 2023-05-27 NOTE — Progress Notes (Unsigned)
[facility] [name]  ASSESSMENT / PLAN: 72 y.o. 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  Overall the patient is doing well.  Her ulceration is asymptomatic.  Her blood pressure is reasonably well-controlled.  I will see her again in 12 months with a repeat CT angiogram  CHIEF COMPLAINT: surveillance of PAU  HISTORY OF PRESENT ILLNESS: Robin Arellano is a 72 y.o. female well-known to me for a symptomatic penetrating atherosclerotic ulcer of the descending thoracic aorta.  I have been monitoring this for some time now.  The patient has always been asymptomatic.  She has had multiple CT scans which are stable.  We reviewed her most recent CT scan in detail.  Past Medical History:  Diagnosis Date   Allergy    Anemia    Anxiety    on meds   Back pain    Blood transfusion without reported diagnosis    Cataract    Chronic female pelvic pain    Chronic kidney disease    Coronary artery disease    mild, non-obstructive 11/2018   Depression    on meds   Family history of adverse reaction to anesthesia    sister had difficulty waking up   Fibromyalgia    H/O leukocytosis    Headache    Heart murmur    Hyperlipidemia    on meds   Hypertension    on meds   MI (myocardial infarction) (HCC)    Pt states she did not have a MI- EKG was normal, was GERD   Osteoarthritis    on meds   Ovarian cyst, right    PONV (postoperative nausea and vomiting)    Post-operative nausea and vomiting    Stroke (HCC) 05/14/2022   L PCA   SVD (spontaneous vaginal delivery)    x 2   Vitamin D deficiency     Past Surgical History:  Procedure Laterality Date   ABDOMINAL HYSTERECTOMY  1994   TAH.BSO   ANTERIOR CERVICAL DECOMP/DISCECTOMY FUSION  2019   APPENDECTOMY  1975   BACK SURGERY  2023   BIOPSY  05/12/2020   Procedure: BIOPSY;  Surgeon: Rachael Fee, MD;  [facility];  Service:  Endoscopy;;   CARDIAC CATHETERIZATION  2020   CHOLECYSTECTOMY N/A 07/21/2020   Procedure: LAPAROSCOPIC CHOLECYSTECTOMY WITH INTRAOPERATIVE CHOLANGIOGRAM;  Surgeon: Almond Lint, MD;  Location: [facility];  Service: General;  Laterality: N/A;   COLONOSCOPY  08/12/2017   Hx TA (piecemeal)Jacobs-MAC-suprep (good)   ESOPHAGOGASTRODUODENOSCOPY (EGD) WITH PROPOFOL N/A 05/12/2020   Procedure: ESOPHAGOGASTRODUODENOSCOPY (EGD) WITH PROPOFOL;  Surgeon: Rachael Fee, MD;  [facility];  Service: Endoscopy;  Laterality: N/A;   EUS N/A 05/12/2020   Procedure: UPPER ENDOSCOPIC ULTRASOUND (EUS) RADIAL;  Surgeon: Rachael Fee, MD;  [facility];  Service: Endoscopy;  Laterality: N/A;   KNEE SURGERY Bilateral 1996   x 2 - arthroscopic   LEFT HEART CATH AND CORONARY ANGIOGRAPHY N/A 11/25/2018   Procedure: LEFT HEART CATH AND CORONARY ANGIOGRAPHY;  Surgeon: Lennette Bihari, MD;  [facility];  Service: Cardiovascular;  Laterality: N/A;   PELVIC LAPAROSCOPY  1989   W LYSIS OF ADHESIONS/L SALPINGONEOSTOMY   TUBAL LIGATION     WISDOM TOOTH EXTRACTION      Family History  Problem Relation Age of Onset   Miscarriages / [place] Mother    Early death Mother    Cancer Mother  Uterine cancer Mother 81   Heart attack Mother    Arthritis Father    Cancer Father    Aneurysm Father    Other Sister        MGUS    Other Sister        MA   COPD Brother    Skin cancer Brother    Skin cancer Brother    Heart disease Paternal Grandfather    Colon cancer Neg Hx    Rectal cancer Neg Hx    Stomach cancer Neg Hx    Stroke Neg Hx    Neuropathy Neg Hx    Colon polyps Neg Hx    Esophageal cancer Neg Hx     Social History   Socioeconomic History   Marital status: Married    Spouse name: don   Number of children: 2   Years of education: College   Highest education level: Not on file  Occupational History   Occupation: Realtor  Tobacco Use   Smoking status:  Former    Current packs/day: 0.00    Average packs/day: 0.2 packs/day for 20.0 years (3.0 ttl pk-yrs)    Types: Cigarettes    Start date: 11/12/1978    Quit date: 11/12/1998    Years since quitting: 24.5   Smokeless tobacco: Never  Vaping Use   Vaping status: Never Used  Substance and Sexual Activity   Alcohol use: Not Currently   Drug use: No   Sexual activity: Not Currently    Birth control/protection: Post-menopausal, Surgical    Comment: HYSTERECTOMY  Other Topics Concern   Not on file  Social History Narrative   Lives at home with her husband Don   Right handed   Caffeine: unsweet tea, 2 glasses daily   Social Determinants of Health   Financial Resource Strain: Low Risk  (03/24/2023)   Overall Financial Resource Strain (CARDIA)    Difficulty of Paying Living Expenses: Not very hard  Food Insecurity: No Food Insecurity (03/24/2023)   Hunger Vital Sign    Worried About Running Out of Food in the Last Year: Never true    Ran Out of Food in the Last Year: Never true  Transportation Needs: No Transportation Needs (03/24/2023)   PRAPARE - Administrator, Civil Service (Medical): No    Lack of Transportation (Non-Medical): No  Physical Activity: Insufficiently Active (03/24/2023)   Exercise Vital Sign    Days of Exercise per Week: 3 days    Minutes of Exercise per Session: 20 min  Stress: Stress Concern Present (03/24/2023)   [employer] of Occupational Health - Occupational Stress Questionnaire    Feeling of Stress : To some extent  Social Connections: Unknown (03/24/2023)   Social Connection and Isolation Panel [NHANES]    Frequency of Communication with Friends and Family: More than three times a week    Frequency of Social Gatherings with Friends and Family: Once a week    Attends Religious Services: Patient declined    Database administrator or Organizations: No    Attends Engineer, structural: Never    Marital Status: Married  Catering manager  Violence: Not on file    Allergies  Allergen Reactions   Flexeril [Cyclobenzaprine] Anaphylaxis, Hives, Itching and Swelling   Lidocaine Hives   Zanaflex [Tizanidine] Anaphylaxis, Hives, Itching and Swelling   Neurontin [Gabapentin] Itching and Swelling   Latex Rash    Current Outpatient Medications  Medication Sig Dispense Refill   ALPRAZolam Prudy Feeler)  0.5 MG tablet TAKE 1 TABLET(0.5 MG) BY MOUTH TWICE DAILY AS NEEDED FOR SLEEP OR ANXIETY 60 tablet 2   amLODipine (NORVASC) 10 MG tablet TAKE 1 TABLET(10 MG) BY MOUTH DAILY 90 tablet 3   Ascorbic Acid (VITAMIN C) 1000 MG tablet Take 1,000 mg by mouth in the morning.     aspirin EC 81 MG tablet Take 1 tablet (81 mg total) by mouth daily. Swallow whole. 90 tablet 3   butalbital-acetaminophen-caffeine (FIORICET) 50-325-40 MG tablet TAKE 1 TABLET BY MOUTH EVERY 6 HOURS AS NEEDED FOR HEADACHE 15 tablet 1   Calcium Carbonate Antacid (TUMS PO) Take 1 tablet by mouth daily as needed (stomach pain).     Cholecalciferol (VITAMIN D) 50 MCG (2000 UT) tablet Take 2,000 Units by mouth in the morning.     cloNIDine (CATAPRES) 0.1 MG tablet TAKE 1 TABLET(0.1 MG) BY MOUTH TWICE DAILY 90 tablet 3   ezetimibe (ZETIA) 10 MG tablet TAKE 1 TABLET(10 MG) BY MOUTH DAILY 90 tablet 3   hydrALAZINE (APRESOLINE) 100 MG tablet TAKE 1 TABLET(100 MG) BY MOUTH THREE TIMES DAILY 270 tablet 2   HYDROcodone-acetaminophen (NORCO) 10-325 MG tablet Take 1 tablet by mouth every 6 (six) hours as needed.     methocarbamol (ROBAXIN) 500 MG tablet Take 1 tablet (500 mg total) by mouth every 6 (six) hours as needed for muscle spasms. 30 tablet 3   ondansetron (ZOFRAN) 4 MG tablet TAKE 1 TABLET(4 MG) BY MOUTH EVERY 4 HOURS AS NEEDED FOR NAUSEA OR VOMITING 90 tablet 1   ondansetron (ZOFRAN) 4 MG tablet TAKE 1 TABLET(4 MG) BY MOUTH EVERY 4 HOURS AS NEEDED FOR NAUSEA OR VOMITING 90 tablet 1   pantoprazole (PROTONIX) 40 MG tablet TAKE 1 TABLET(40 MG) BY MOUTH DAILY 90 tablet 3   Potassium  Chloride ER 20 MEQ TBCR TAKE 1 TABLET BY MOUTH IN THE MORNING AND AT BEDTIME 180 tablet 0   pravastatin (PRAVACHOL) 40 MG tablet Take 1 tablet (40 mg total) by mouth every evening. 90 tablet 3   spironolactone (ALDACTONE) 25 MG tablet Take 25 mg by mouth daily.     valACYclovir (VALTREX) 500 MG tablet TAKE 1 TABLET BY MOUTH TWICE DAILY FOR 3 TO 5 DAYS THEN TAKE DAILY AS NEEDED 30 tablet 0   albuterol (VENTOLIN HFA) 108 (90 Base) MCG/ACT inhaler Inhale 2 puffs into the lungs every 4 (four) hours as needed for wheezing or shortness of breath. (Patient not taking: Reported on 04/11/2023) 8 g 0   No current facility-administered medications for this visit.    PHYSICAL EXAM Vitals:   05/28/23 1106  BP: (!) 164/72  Resp: 20  Temp: 98.4 F (36.9 C)  Weight: 148 lb (67.1 kg)  Height: 5\' 4"  (1.626 m)    Well-appearing elderly woman in no acute distress Regular rate and rhythm Unlabored breathing  PERTINENT LABORATORY AND RADIOLOGIC DATA  Most recent CBC    Latest Ref Rng & Units 04/11/2023    2:51 PM 11/23/2022   10:56 AM 11/19/2022    3:52 PM  CBC  WBC 4.0 - 10.5 K/uL 7.8  8.6  9.2   Hemoglobin 12.0 - 15.0 g/dL 29.5  18.8  41.6   Hematocrit 36.0 - 46.0 % 33.4  34.0  33.0   Platelets 150 - 400 K/uL 266  277  335.0      Most recent CMP    Latest Ref Rng & Units 04/11/2023    2:51 PM 11/23/2022   10:56 AM 11/19/2022  3:52 PM  CMP  Glucose 70 - 99 mg/dL 95  413  244   BUN 8 - 23 mg/dL 28  28  26    Creatinine 0.44 - 1.00 mg/dL 0.10  2.72  5.36   Sodium 135 - 145 mmol/L 137  138  140   Potassium 3.5 - 5.1 mmol/L 4.1  4.2  4.9   Chloride 98 - 111 mmol/L 106  103  107   CO2 22 - 32 mmol/L 20  22  23    Calcium 8.9 - 10.3 mg/dL 9.5  64.4  9.8   Total Protein 6.5 - 8.1 g/dL 8.1   7.6   Total Bilirubin 0.3 - 1.2 mg/dL 0.3   0.3   Alkaline Phos 38 - 126 U/L 106   74   AST 15 - 41 U/L 15   12   ALT 0 - 44 U/L 7   5     Renal function CrCl cannot be calculated (Patient's most recent  lab result is older than the maximum 21 days allowed.).  Hgb A1c MFr Bld (%)  Date Value  05/16/2022 5.0    LDL Chol Calc (NIH)  Date Value Ref Range Status  09/28/2022 65 0 - 99 mg/dL Final    CT angiogram Two penetrating ulcers or small pseudoaneurysms noted in the thoracic aorta, the largest in the distal aortic arch or proximal descending thoracic aorta measuring up to 1 cm. The 2nd area is along the posterior mid descending thoracic aorta measuring up to 7 mm. These areas are stable since prior study.   No evidence of aortic dissection.   Marked atherosclerotic disease with irregular calcified and noncalcified plaque throughout the thoracic and abdominal aorta.   Tight stenosis of the left renal artery. The origin is not well visualized. Mild associated left renal atrophy.   Centrilobular emphysema. Stable 1-2 mm right upper lobe pulmonary nodule.   Sigmoid diverticulosis.   No acute findings in the chest, abdomen or pelvis.    Rande Brunt. Lenell Antu, MD [facility] Phone Number: [phone] 05/28/2023 12:18 PM   Total time spent on preparing this encounter including chart review, data review, collecting history, examining the patient, coordinating care for this established patient, 30 minutes.  Portions of this report may have been transcribed using voice recognition software.  Every effort has been made to ensure accuracy; however, inadvertent computerized transcription errors may still be present.

## 2023-05-28 ENCOUNTER — Ambulatory Visit: Payer: PPO | Admitting: Vascular Surgery

## 2023-05-28 ENCOUNTER — Encounter: Payer: Self-pay | Admitting: Vascular Surgery

## 2023-05-28 VITALS — BP 164/72 | Temp 98.4°F | Resp 20 | Ht 64.0 in | Wt 148.0 lb

## 2023-05-28 DIAGNOSIS — I719 Aortic aneurysm of unspecified site, without rupture: Secondary | ICD-10-CM

## 2023-05-29 LAB — CUP PACEART REMOTE DEVICE CHECK
Date Time Interrogation Session: 20240713230926
Implantable Pulse Generator Implant Date: 20231117

## 2023-06-03 ENCOUNTER — Ambulatory Visit: Payer: PPO

## 2023-06-07 DIAGNOSIS — G894 Chronic pain syndrome: Secondary | ICD-10-CM | POA: Diagnosis not present

## 2023-06-07 DIAGNOSIS — G5701 Lesion of sciatic nerve, right lower limb: Secondary | ICD-10-CM | POA: Diagnosis not present

## 2023-06-07 DIAGNOSIS — M961 Postlaminectomy syndrome, not elsewhere classified: Secondary | ICD-10-CM | POA: Diagnosis not present

## 2023-06-07 DIAGNOSIS — M25551 Pain in right hip: Secondary | ICD-10-CM | POA: Diagnosis not present

## 2023-06-12 ENCOUNTER — Encounter (INDEPENDENT_AMBULATORY_CARE_PROVIDER_SITE_OTHER): Payer: Self-pay

## 2023-06-12 NOTE — Progress Notes (Signed)
Carelink Summary Report / Loop Recorder 

## 2023-06-21 ENCOUNTER — Other Ambulatory Visit: Payer: Self-pay | Admitting: Family Medicine

## 2023-06-25 ENCOUNTER — Encounter: Payer: Self-pay | Admitting: Family Medicine

## 2023-06-25 ENCOUNTER — Ambulatory Visit: Payer: PPO | Admitting: Family Medicine

## 2023-06-25 VITALS — BP 142/72 | HR 57 | Temp 98.6°F | Resp 18 | Ht 64.0 in | Wt 150.1 lb

## 2023-06-25 DIAGNOSIS — G43009 Migraine without aura, not intractable, without status migrainosus: Secondary | ICD-10-CM | POA: Diagnosis not present

## 2023-06-25 DIAGNOSIS — D500 Iron deficiency anemia secondary to blood loss (chronic): Secondary | ICD-10-CM

## 2023-06-25 DIAGNOSIS — I2510 Atherosclerotic heart disease of native coronary artery without angina pectoris: Secondary | ICD-10-CM | POA: Diagnosis not present

## 2023-06-25 DIAGNOSIS — F419 Anxiety disorder, unspecified: Secondary | ICD-10-CM

## 2023-06-25 DIAGNOSIS — E041 Nontoxic single thyroid nodule: Secondary | ICD-10-CM | POA: Diagnosis not present

## 2023-06-25 DIAGNOSIS — E782 Mixed hyperlipidemia: Secondary | ICD-10-CM | POA: Diagnosis not present

## 2023-06-25 DIAGNOSIS — I10 Essential (primary) hypertension: Secondary | ICD-10-CM

## 2023-06-25 DIAGNOSIS — R5382 Chronic fatigue, unspecified: Secondary | ICD-10-CM | POA: Diagnosis not present

## 2023-06-25 MED ORDER — ALPRAZOLAM 0.5 MG PO TABS: ORAL_TABLET | ORAL | 2 refills | Status: DC

## 2023-06-25 NOTE — Patient Instructions (Signed)

## 2023-06-25 NOTE — Assessment & Plan Note (Signed)
Chronic Stable 

## 2023-06-25 NOTE — Assessment & Plan Note (Signed)
Chronic.  Mostly controlled.  Managed by nephrology.  Continue amlodipine 10 mg, clonidine 0.1 mg twice daily, hydralazine 100 mg 3 times daily, spironolactone 25 mg daily

## 2023-06-25 NOTE — Progress Notes (Signed)
Subjective:     Patient ID: Robin Arellano, female    DOB: 05/24/1951, 72 y.o.   MRN: [id number]  Chief Complaint  Patient presents with   Follow-up    Follow-up on moods Node on thyroid that need to get checked    HPI Here with her sister. Ambulates with cane.   Thyroid - She reports a 1.5 cm right thyroid nodule found in CT on 10/11/2022. This finding was incidental. She denies pain/difficulty swallowing or any other sx. Her latest CT on 05/06/2023 revealed it decreased in size, was 0.9 mm. She has been following up Dr. Lenell Antu from [facility]. Does complain of chronic fatigue  HTN - Pt is on amlodipine 10 mg, clonidine 0.1 mg bid, hydralazine 100 mg three times daily, and spironolactone 25 mg. Bp's running 130s systolic, this morning was 134. At initial check her BP was 156/73, when rechecked BP was 142/72. She notes that today she did not take her hydralazine at 2:30 pm.  No dizziness/cp/palp/edema/cough/sob. Managed by nephrologist  Migraines - She has been having a migraine about 2-4x/week. She has been taking Fioricet 50-325-40 mg and Tylenol 4x/day, every 6 hours as needed. Has seen neuro very long time ago.  Doesn't want more meds.  Nocturia - She reports she has experienced nocturia since her hospitalization, about a year ago. She wakes up with a full bladder every hour, regularly throughout the night. During the day she can last about 4-5 hours without needing to urinate. Denies any incontinence.   Sciatica - She has been following up with Dr. Vear Clock for pain management. She reports experiencing some pain during this visit.   Anxiety - Managing her anxiety with Xanax BID. Pt requested only 30 Xanax. No SI.   Had blood work done on May 14th  chronic fatigue.  Anemia better.seeing Heme   Health Maintenance Due  Topic Date Due   DTaP/Tdap/Td (1 - Tdap) Never done   MAMMOGRAM  12/22/2022    Past Medical History:  Diagnosis Date   Allergy    Anemia    Anxiety     on meds   Back pain    Blood transfusion without reported diagnosis    Cataract    Chronic female pelvic pain    Chronic kidney disease    Coronary artery disease    mild, non-obstructive 11/2018   Depression    on meds   Family history of adverse reaction to anesthesia    sister had difficulty waking up   Fibromyalgia    H/O leukocytosis    Headache    Heart murmur    Hyperlipidemia    on meds   Hypertension    on meds   MI (myocardial infarction) (HCC)    Pt states she did not have a MI- EKG was normal, was GERD   Osteoarthritis    on meds   Ovarian cyst, right    PONV (postoperative nausea and vomiting)    Post-operative nausea and vomiting    Stroke (HCC) 05/14/2022   L PCA   SVD (spontaneous vaginal delivery)    x 2   Vitamin D deficiency     Past Surgical History:  Procedure Laterality Date   ABDOMINAL HYSTERECTOMY  1994   TAH.BSO   ANTERIOR CERVICAL DECOMP/DISCECTOMY FUSION  2019   APPENDECTOMY  1975   BACK SURGERY  2023   BIOPSY  05/12/2020   Procedure: BIOPSY;  Surgeon: Rachael Fee, MD;  Location: [facility];  Service: Endoscopy;;   CARDIAC CATHETERIZATION  2020   CHOLECYSTECTOMY N/A 07/21/2020   Procedure: LAPAROSCOPIC CHOLECYSTECTOMY WITH INTRAOPERATIVE CHOLANGIOGRAM;  Surgeon: Almond Lint, MD;  Location: [facility];  Service: General;  Laterality: N/A;   COLONOSCOPY  08/12/2017   Hx TA (piecemeal)Jacobs-MAC-suprep (good)   ESOPHAGOGASTRODUODENOSCOPY (EGD) WITH PROPOFOL N/A 05/12/2020   Procedure: ESOPHAGOGASTRODUODENOSCOPY (EGD) WITH PROPOFOL;  Surgeon: Rachael Fee, MD;  Location: [facility];  Service: Endoscopy;  Laterality: N/A;   EUS N/A 05/12/2020   Procedure: UPPER ENDOSCOPIC ULTRASOUND (EUS) RADIAL;  Surgeon: Rachael Fee, MD;  Location: [facility];  Service: Endoscopy;  Laterality: N/A;   KNEE SURGERY Bilateral 1996   x 2 - arthroscopic   LEFT HEART CATH AND CORONARY ANGIOGRAPHY N/A 11/25/2018   Procedure: LEFT HEART CATH  AND CORONARY ANGIOGRAPHY;  Surgeon: Lennette Bihari, MD;  Location: [facility];  Service: Cardiovascular;  Laterality: N/A;   PELVIC LAPAROSCOPY  1989   W LYSIS OF ADHESIONS/L SALPINGONEOSTOMY   TUBAL LIGATION     WISDOM TOOTH EXTRACTION       Current Outpatient Medications:    albuterol (VENTOLIN HFA) 108 (90 Base) MCG/ACT inhaler, Inhale 2 puffs into the lungs every 4 (four) hours as needed for wheezing or shortness of breath., Disp: 8 g, Rfl: 0   amLODipine (NORVASC) 10 MG tablet, TAKE 1 TABLET(10 MG) BY MOUTH DAILY, Disp: 90 tablet, Rfl: 3   Ascorbic Acid (VITAMIN C) 1000 MG tablet, Take 1,000 mg by mouth in the morning., Disp: , Rfl:    aspirin EC 81 MG tablet, Take 1 tablet (81 mg total) by mouth daily. Swallow whole., Disp: 90 tablet, Rfl: 3   butalbital-acetaminophen-caffeine (FIORICET) 50-325-40 MG tablet, TAKE 1 TABLET BY MOUTH EVERY 6 HOURS AS NEEDED FOR HEADACHE, Disp: 15 tablet, Rfl: 1   Calcium Carbonate Antacid (TUMS PO), Take 1 tablet by mouth daily as needed (stomach pain)., Disp: , Rfl:    Cholecalciferol (VITAMIN D) 50 MCG (2000 UT) tablet, Take 2,000 Units by mouth in the morning., Disp: , Rfl:    cloNIDine (CATAPRES) 0.1 MG tablet, TAKE 1 TABLET(0.1 MG) BY MOUTH TWICE DAILY, Disp: 90 tablet, Rfl: 3   ezetimibe (ZETIA) 10 MG tablet, TAKE 1 TABLET(10 MG) BY MOUTH DAILY, Disp: 90 tablet, Rfl: 3   hydrALAZINE (APRESOLINE) 100 MG tablet, TAKE 1 TABLET(100 MG) BY MOUTH THREE TIMES DAILY, Disp: 270 tablet, Rfl: 2   HYDROcodone-acetaminophen (NORCO) 10-325 MG tablet, Take 1 tablet by mouth every 6 (six) hours as needed., Disp: , Rfl:    methocarbamol (ROBAXIN) 500 MG tablet, Take 1 tablet (500 mg total) by mouth every 6 (six) hours as needed for muscle spasms., Disp: 30 tablet, Rfl: 3   ondansetron (ZOFRAN) 4 MG tablet, TAKE 1 TABLET(4 MG) BY MOUTH EVERY 4 HOURS AS NEEDED FOR NAUSEA OR VOMITING, Disp: 90 tablet, Rfl: 1   ondansetron (ZOFRAN) 4 MG tablet, TAKE 1 TABLET(4 MG)  BY MOUTH EVERY 4 HOURS AS NEEDED FOR NAUSEA OR VOMITING, Disp: 90 tablet, Rfl: 1   pantoprazole (PROTONIX) 40 MG tablet, TAKE 1 TABLET(40 MG) BY MOUTH DAILY, Disp: 90 tablet, Rfl: 3   Potassium Chloride ER 20 MEQ TBCR, TAKE 1 TABLET BY MOUTH IN THE MORNING AND AT BEDTIME, Disp: 180 tablet, Rfl: 0   pravastatin (PRAVACHOL) 40 MG tablet, Take 1 tablet (40 mg total) by mouth every evening., Disp: 90 tablet, Rfl: 3   spironolactone (ALDACTONE) 25 MG tablet, Take 25 mg by mouth daily., Disp: ,  Rfl:    valACYclovir (VALTREX) 500 MG tablet, TAKE 1 TABLET BY MOUTH TWICE DAILY FOR 3 TO 5 DAYS THEN TAKE DAILY AS NEEDED, Disp: 30 tablet, Rfl: 0   [START ON 07/14/2023] ALPRAZolam (XANAX) 0.5 MG tablet, TAKE 1 TABLET(0.5 MG) BY MOUTH TWICE DAILY AS NEEDED FOR SLEEP OR ANXIETY, Disp: 30 tablet, Rfl: 2  Allergies  Allergen Reactions   Flexeril [Cyclobenzaprine] Anaphylaxis, Hives, Itching and Swelling   Lidocaine Hives   Zanaflex [Tizanidine] Anaphylaxis, Hives, Itching and Swelling   Neurontin [Gabapentin] Itching and Swelling   Latex Rash   ROS neg/noncontributory except as noted HPI/below  Chronic fatigue      Objective:     BP (!) 142/72 (BP Location: Left Arm, Patient Position: Sitting, Cuff Size: Large)   Pulse (!) 57   Temp 98.6 F (37 C) (Temporal)   Resp 18   Ht 5\' 4"  (1.626 m)   Wt 150 lb 2 oz (68.1 kg)   LMP  (LMP Unknown)   SpO2 97%   BMI 25.77 kg/m  Wt Readings from Last 3 Encounters:  06/25/23 150 lb 2 oz (68.1 kg)  05/28/23 148 lb (67.1 kg)  04/11/23 144 lb (65.3 kg)    Physical Exam   Gen: [facility]N NAD HEENT: NCAT, conjunctiva not injected, sclera nonicteric NECK:  supple, no thyromegaly, no nodes, no carotid bruits CARDIAC: RRR, S1S2+. DP 2+B. +2/6 murmur.  LUNGS: CTAB. No wheezes ABDOMEN:  BS+, soft, NTND, No HSM, no masses EXT:  no edema MSK: no gross abnormalities.  NEURO: A&O x3.  CN II-XII intact.  PSYCH: normal mood. Good eye contact  Reviewed CT scan  reports     Assessment & Plan:  Essential hypertension Assessment & Plan: Chronic.  Mostly controlled.  Managed by nephrology.  Continue amlodipine 10 mg, clonidine 0.1 mg twice daily, hydralazine 100 mg 3 times daily, spironolactone 25 mg daily  Orders: -     TSH  Thyroid nodule  Coronary artery disease involving native coronary artery of native heart without angina pectoris Assessment & Plan: Chronic.  Stable.  Orders: -     TSH  Mixed hyperlipidemia -     Lipid panel -     TSH  Iron deficiency anemia due to chronic blood loss -     Vitamin B12  Migraine without aura and without status migrainosus, not intractable Assessment & Plan: Chronic.  Working on [employer].  Can't do triptans d/t CAD/CVA.  Declines ubrelvy   Anxiety Assessment & Plan: Chronic.  A lot of stressors in her life.  Fair control.  She does take Xanax 0.5 mg twice daily, however is going to try to decrease to just 1/day due to risks and interactions with other medicines.   Chronic fatigue  Other orders -     ALPRAZolam; TAKE 1 TABLET(0.5 MG) BY MOUTH TWICE DAILY AS NEEDED FOR SLEEP OR ANXIETY  Dispense: 30 tablet; Refill: 2  Chronic fatigue-multifactorial.  She does have a history of anemia.  This is managed by hematology.  Currently well-controlled.  Taking iron and B12 supplements.  Also can be from all of her multiple medications and chronic diseases.  Try to decrease use of Xanax.  Will check TSH.  Thyroid nodule on right-in dental finding from routine CTs to monitor aneurysms.  It has decreased in size from November to [place].  No further imaging recommended by radiology.  Will check thyroid tests.  Return in about 3 months (around 09/25/2023) for mood.  I,Rachel Rivera,acting  as a scribe for Angelena Sole, MD.,have documented all relevant documentation on the behalf of Angelena Sole, MD,as directed by  Angelena Sole, MD while in the presence of Angelena Sole, MD.  I, Angelena Sole, MD, have  reviewed all documentation for this visit. The documentation on 06/25/23 for the exam, diagnosis, procedures, and orders are all accurate and complete.    Angelena Sole, MD

## 2023-06-25 NOTE — Assessment & Plan Note (Signed)
Chronic.  Working on [employer].  Can't do triptans d/t CAD/CVA.  Declines Bernita Raisin

## 2023-06-25 NOTE — Assessment & Plan Note (Signed)
Chronic.  A lot of stressors in her life.  Fair control.  She does take Xanax 0.5 mg twice daily, however is going to try to decrease to just 1/day due to risks and interactions with other medicines.

## 2023-06-27 NOTE — Progress Notes (Signed)
B12 low-is she taking it otc?  If not, needs to.  If so, does she want to do B12 shots?

## 2023-06-28 ENCOUNTER — Other Ambulatory Visit: Payer: Self-pay | Admitting: Family Medicine

## 2023-06-28 LAB — CUP PACEART REMOTE DEVICE CHECK
Date Time Interrogation Session: 20240816093142
Implantable Pulse Generator Implant Date: 20231117

## 2023-07-01 ENCOUNTER — Ambulatory Visit (INDEPENDENT_AMBULATORY_CARE_PROVIDER_SITE_OTHER): Payer: PPO

## 2023-07-01 DIAGNOSIS — I639 Cerebral infarction, unspecified: Secondary | ICD-10-CM

## 2023-07-05 ENCOUNTER — Other Ambulatory Visit: Payer: Self-pay | Admitting: Family Medicine

## 2023-07-08 ENCOUNTER — Ambulatory Visit: Payer: PPO

## 2023-07-12 NOTE — Progress Notes (Signed)
Carelink Summary Report / Loop Recorder 

## 2023-07-31 ENCOUNTER — Ambulatory Visit: Payer: PPO | Admitting: Cardiology

## 2023-07-31 ENCOUNTER — Other Ambulatory Visit: Payer: Self-pay | Admitting: Family Medicine

## 2023-08-01 DIAGNOSIS — M25551 Pain in right hip: Secondary | ICD-10-CM | POA: Diagnosis not present

## 2023-08-01 DIAGNOSIS — M961 Postlaminectomy syndrome, not elsewhere classified: Secondary | ICD-10-CM | POA: Diagnosis not present

## 2023-08-01 DIAGNOSIS — G5701 Lesion of sciatic nerve, right lower limb: Secondary | ICD-10-CM | POA: Diagnosis not present

## 2023-08-01 DIAGNOSIS — G894 Chronic pain syndrome: Secondary | ICD-10-CM | POA: Diagnosis not present

## 2023-08-05 ENCOUNTER — Ambulatory Visit (INDEPENDENT_AMBULATORY_CARE_PROVIDER_SITE_OTHER): Payer: PPO

## 2023-08-05 DIAGNOSIS — I639 Cerebral infarction, unspecified: Secondary | ICD-10-CM

## 2023-08-13 ENCOUNTER — Other Ambulatory Visit: Payer: Self-pay | Admitting: Cardiovascular Disease

## 2023-08-20 NOTE — Progress Notes (Signed)
Carelink Summary Report / Loop Recorder 

## 2023-08-23 ENCOUNTER — Other Ambulatory Visit: Payer: Self-pay | Admitting: Family

## 2023-08-23 ENCOUNTER — Ambulatory Visit (HOSPITAL_BASED_OUTPATIENT_CLINIC_OR_DEPARTMENT_OTHER): Payer: PPO | Admitting: Cardiology

## 2023-08-23 ENCOUNTER — Encounter (HOSPITAL_BASED_OUTPATIENT_CLINIC_OR_DEPARTMENT_OTHER): Payer: Self-pay | Admitting: Cardiology

## 2023-08-23 VITALS — BP 156/64 | HR 53 | Ht 64.0 in | Wt 152.0 lb

## 2023-08-23 DIAGNOSIS — I10 Essential (primary) hypertension: Secondary | ICD-10-CM

## 2023-08-23 DIAGNOSIS — I2510 Atherosclerotic heart disease of native coronary artery without angina pectoris: Secondary | ICD-10-CM

## 2023-08-23 DIAGNOSIS — R0789 Other chest pain: Secondary | ICD-10-CM

## 2023-08-23 DIAGNOSIS — Z8249 Family history of ischemic heart disease and other diseases of the circulatory system: Secondary | ICD-10-CM

## 2023-08-23 DIAGNOSIS — Z8673 Personal history of transient ischemic attack (TIA), and cerebral infarction without residual deficits: Secondary | ICD-10-CM | POA: Diagnosis not present

## 2023-08-23 DIAGNOSIS — Z7189 Other specified counseling: Secondary | ICD-10-CM | POA: Diagnosis not present

## 2023-08-23 DIAGNOSIS — N1832 Chronic kidney disease, stage 3b: Secondary | ICD-10-CM

## 2023-08-23 DIAGNOSIS — I160 Hypertensive urgency: Secondary | ICD-10-CM

## 2023-08-23 NOTE — Patient Instructions (Signed)
Medication Instructions:  Your physician recommends that you continue on your current medications as directed. Please refer to the Current Medication list given to you today.   *If you need a refill on your cardiac medications before your next appointment, please call your pharmacy*  Lab Work: NONE  Testing/Procedures: NONE   Follow-Up: At [facility], you and your health needs are our priority.  As part of our continuing mission to provide you with exceptional heart care, we have created designated Provider Care Teams.  These Care Teams include your primary Cardiologist (physician) and Advanced Practice Providers (APPs -  Physician Assistants and Nurse Practitioners) who all work together to provide you with the care you need, when you need it.  We recommend signing up for the patient portal called "MyChart".  Sign up information is provided on this After Visit Summary.  MyChart is used to connect with patients for Virtual Visits (Telemedicine).  Patients are able to view lab/test results, encounter notes, upcoming appointments, etc.  Non-urgent messages can be sent to your provider as well.   To learn more about what you can do with MyChart, go to [email or web address].    Your next appointment:   6 month(s)  The format for your next appointment:   In Person  Provider:   Bridgette Christopher, MD             

## 2023-08-23 NOTE — Progress Notes (Signed)
Cardiology Office Note:  .    Date:  08/23/2023  ID:  Robin Arellano, Robin Arellano 1951-09-17, MRN [id number] PCP: Robin Sow, MD  [facility] Providers Cardiologist:  Jodelle Red, MD     History of Present Illness: .    Robin Arellano is a 72 y.o. female with a hx of nonobstructive CAD, hypertension, hyperlipidemia (familial), Cspine surgery 2021, CVA s/p ILR placement 09/28/2022, here for follow-up.   Was seen by Dr. Ladona Arellano on 09/28/22 for ILR placement for cryptogenic stroke. MRI head 05/2022 which noted a prior PCA infarct.   She is formerly a patient of Dr. Shari Arellano, last seen by her 12/27/2022. At that visit she complained of fatigue but was otherwise doing well from a cardiovascular perspective. She continued to have headaches which had increased since her stroke, but her blood pressure had significantly improved to 130-140s systolic at home.   Today, she is accompanied by her sister.  Cardiovascular risk factors: Prior clinical ASCVD: Nonobstructive CAD seen on cath 2020. Comorbid conditions: Hypertension - in the office her blood pressure is 156/64.  Usually in the 140s systolic at home, sometimes in the 130s. Per her nephrologist, her clonidine was reduced to 0.1 mg BID, from TID. Blood pressure at their visit had been 123 systolic, but has not been that low since then. Since reducing clonidine she has noticed much of a difference in her blood pressures. She is scheduled to follow up with Dr. Arrie Arellano on 09/05/23. Metabolic syndrome/Obesity:  Current weight 152 lbs. Chronic inflammatory conditions: None. Tobacco use history: Former smoker. Family history: Her sister is also a patient of mine. Prior pertinent testing and/or incidental findings: Echo 05/2022 with LVEF 60-65%, mild LVH, trivial mitral regurgitation, aortic sclerosis.  Exercise level: In the last month her most strenuous activities include weight-bearing exercises for her right thigh. She  walks along her house and completes housework without anginal symptoms. However, she does have other chest pain that occurs about once a week and resolves with taking aspirin. She describes this as her chest hurts, always localized in her central chest possibly related to the location of her cervical disc surgery in 2021. Overall her breathing has been stable. Additionally she complains of constant back pain, and new severe neck pain over the last 3 days. Hot packs provide some relief. On physical exam today she has palpable muscle knots of her neck. Of note, she recently had dental work performed (temporary crown).  She denies any palpitations, shortness of breath, peripheral edema, lightheadedness, headaches, syncope, orthopnea, or PND.  ROS:  Please see the history of present illness. ROS otherwise negative except as noted.  (+) Intermittent chest pain (+) Severe neck pain (+) Chronic back pain  Studies Reviewed: Marland Kitchen    EKG Interpretation Date/Time:  Friday August 23 2023 15:26:54 EST Ventricular Rate:  53 PR Interval:  178 QRS Duration:  88 QT Interval:  460 QTC Calculation: 431 R Axis:   55  Text Interpretation: Sinus bradycardia Confirmed by Jodelle Red [phone]) on 10/13/2023 8:07:21 PM    Echo  05/16/2022: 1. Left ventricular ejection fraction, by estimation, is 60 to 65%. The  left ventricle has normal function. The left ventricle has no regional  wall motion abnormalities. There is mild concentric left ventricular  hypertrophy. Left ventricular diastolic  parameters are indeterminate.   2. Right ventricular systolic function is normal. The right ventricular  size is normal. There is normal pulmonary artery systolic pressure.   3. Right atrial size  was mildly dilated.   4. The mitral valve is normal in structure. Trivial mitral valve  regurgitation. No evidence of mitral stenosis.   5. The aortic valve is normal in structure. Aortic valve regurgitation is  not  visualized. Aortic valve sclerosis is present, with no evidence of  aortic valve stenosis.   6. The inferior vena cava is normal in size with greater than 50%  respiratory variability, suggesting right atrial pressure of 3 mmHg.     Physical Exam:    VS:  BP (!) 156/64 (BP Location: Left Arm, Patient Position: Sitting, Cuff Size: Normal)   Pulse (!) 53   Ht 5\' 4"  (1.626 m)   Wt 152 lb (68.9 kg)   LMP  (LMP Unknown)   BMI 26.09 kg/m    Wt Readings from Last 3 Encounters:  08/23/23 152 lb (68.9 kg)  06/25/23 150 lb 2 oz (68.1 kg)  05/28/23 148 lb (67.1 kg)    GEN: Well nourished, well developed in no acute distress HEENT: Normal, moist mucous membranes NECK: No JVD CARDIAC: regular rhythm, normal S1 and S2, no rubs or gallops. No murmur. VASCULAR: Radial and DP pulses 2+ bilaterally. No carotid bruits RESPIRATORY:  Clear to auscultation without rales, wheezing or rhonchi  ABDOMEN: Soft, non-tender, non-distended MUSCULOSKELETAL:  Ambulates independently; Tight palpable muscle knots of posterior neck.  SKIN: Warm and dry, no edema NEUROLOGIC:  Alert and oriented x 3. No focal neuro deficits noted. PSYCHIATRIC:  Normal affect   ASSESSMENT AND PLAN: .    Nonobstructive CAD Family history of hyperlipidemia Intermittent chest/neck pain -palpable muscle knots on exam today that reproduce her pain. ECG unremarkable -continue aspirin, ezetimibe, pravastatin -last LDL 63, at goal -reviewed red flag warning signs that need immediate medical attention  Hypertension Chronic kidney disease, stage 3b -clonidine recently reduced by Dr. Abel Presto at [facility]. Has upcoming follow up. Will defer titration at this time -currently on clonidine, amlodipine, hydralazine, spironolactone Last GFR 35, consistent with CKD 3b  History of CVA s/p ILR -no afib noted  CV risk counseling and prevention -recommend heart healthy/Mediterranean diet, with whole grains, fruits, vegetable, fish,  lean meats, nuts, and olive oil. Limit salt. -recommend moderate walking, 3-5 times/week for 30-50 minutes each session. Aim for at least 150 minutes.week. Goal should be pace of 3 miles/hours, or walking 1.5 miles in 30 minutes -recommend avoidance of tobacco products. Avoid excess alcohol.  Dispo: Follow-up in 6 months, or sooner as needed.  I,Mathew Stumpf,acting as a Neurosurgeon for [employer], MD.,have documented all relevant documentation on the behalf of Jodelle Red, MD,as directed by  Jodelle Red, MD while in the presence of Jodelle Red, MD.  I, Jodelle Red, MD, have reviewed all documentation for this visit. The documentation on 10/13/23 for the exam, diagnosis, procedures, and orders are all accurate and complete.   Signed, Jodelle Red, MD

## 2023-08-29 ENCOUNTER — Other Ambulatory Visit: Payer: Self-pay | Admitting: Family Medicine

## 2023-08-29 ENCOUNTER — Telehealth: Payer: Self-pay | Admitting: *Deleted

## 2023-08-29 MED ORDER — BUTALBITAL-APAP-CAFFEINE 50-325-40 MG PO TABS: 1.0000 | ORAL_TABLET | Freq: Four times a day (QID) | ORAL | 0 refills | Status: DC | PRN

## 2023-08-29 NOTE — Telephone Encounter (Signed)
Patient called requesting a refill of Foricet 50-325-40 mg. Patient stated she knocked her front tooth out, not by falling, but she has a headache and that is the reason for requesting the medication.

## 2023-09-05 DIAGNOSIS — R700 Elevated erythrocyte sedimentation rate: Secondary | ICD-10-CM | POA: Diagnosis not present

## 2023-09-05 DIAGNOSIS — I714 Abdominal aortic aneurysm, without rupture, unspecified: Secondary | ICD-10-CM | POA: Diagnosis not present

## 2023-09-05 DIAGNOSIS — R809 Proteinuria, unspecified: Secondary | ICD-10-CM | POA: Diagnosis not present

## 2023-09-05 DIAGNOSIS — N1832 Chronic kidney disease, stage 3b: Secondary | ICD-10-CM | POA: Diagnosis not present

## 2023-09-05 DIAGNOSIS — I129 Hypertensive chronic kidney disease with stage 1 through stage 4 chronic kidney disease, or unspecified chronic kidney disease: Secondary | ICD-10-CM | POA: Diagnosis not present

## 2023-09-05 DIAGNOSIS — D509 Iron deficiency anemia, unspecified: Secondary | ICD-10-CM | POA: Diagnosis not present

## 2023-09-05 DIAGNOSIS — N261 Atrophy of kidney (terminal): Secondary | ICD-10-CM | POA: Diagnosis not present

## 2023-09-05 DIAGNOSIS — I2510 Atherosclerotic heart disease of native coronary artery without angina pectoris: Secondary | ICD-10-CM | POA: Diagnosis not present

## 2023-09-06 LAB — LAB REPORT - SCANNED
Creatinine, POC: 153 mg/dL
EGFR: 31

## 2023-09-09 ENCOUNTER — Ambulatory Visit (INDEPENDENT_AMBULATORY_CARE_PROVIDER_SITE_OTHER): Payer: PPO

## 2023-09-09 DIAGNOSIS — I639 Cerebral infarction, unspecified: Secondary | ICD-10-CM | POA: Diagnosis not present

## 2023-09-09 LAB — CUP PACEART REMOTE DEVICE CHECK
Date Time Interrogation Session: 20241027231458
Implantable Pulse Generator Implant Date: 20231117

## 2023-09-23 ENCOUNTER — Ambulatory Visit: Payer: PPO | Admitting: Neurology

## 2023-09-23 ENCOUNTER — Encounter: Payer: Self-pay | Admitting: Neurology

## 2023-09-23 ENCOUNTER — Other Ambulatory Visit: Payer: Self-pay | Admitting: Family Medicine

## 2023-09-23 VITALS — BP 164/66 | HR 65 | Ht 63.0 in | Wt 152.0 lb

## 2023-09-23 DIAGNOSIS — Z8673 Personal history of transient ischemic attack (TIA), and cerebral infarction without residual deficits: Secondary | ICD-10-CM | POA: Diagnosis not present

## 2023-09-23 DIAGNOSIS — G43009 Migraine without aura, not intractable, without status migrainosus: Secondary | ICD-10-CM | POA: Diagnosis not present

## 2023-09-23 DIAGNOSIS — H53461 Homonymous bilateral field defects, right side: Secondary | ICD-10-CM

## 2023-09-23 DIAGNOSIS — G3184 Mild cognitive impairment, so stated: Secondary | ICD-10-CM

## 2023-09-23 NOTE — Progress Notes (Addendum)
[facility] [address] [place]. [place] [address] [phone]       OFFICE FOLLOW-UP NOTE  Ms. Aviona Charly Pettaway Date of Birth:  1951-03-30 Medical Record Number:  [id number]   HPI: Initial visit 08/28/2022 patient is a 72 year old pleasant Caucasian lady seen today for initial office follow-up visit following hospital consultation for stroke in July 2023.  She is accompanied by her sister.  History is obtained from them and review of electronic medical records.  I personally reviewed pertinent available imaging in PACS.Sandrika Kaydee Beltre is a 72 y.o. female with past medical history of fibromyalgia, chronic back pain, s/p recent L4-L5 posterior lumbar fusion, CAD, HTN, HLD with statin intolerance, anxiety, depression, CKD and former smoker who presented on  05/14/22 after sustaining a fall in which her left leg gave out causing her to fall backwards. CT head revealed suspicious acute left infarct. MRI brain confirmed acute/subacute left PCA infarct. Neurology consulted for stroke workup .  On inquiry she stated that she had noted some wavy vision out of her right eye as well as vertigo and headaches about 7 weeks prior to her fall.  MRI confirmed subacute left PCA infarct and showed mild atherosclerotic changes with 3 mm supraclinoid ICA aneurysms bilaterally.  2D echo showed ejection fraction 60 to 65%.  LDL cholesterol is 101 mg percent.  Hemoglobin A1c was 5.0.  He was started on aspirin and Plavix for 3 weeks followed by aspirin alone advised to have outpatient 30-day heart monitor which she has not done.  Patient states her right-sided peripheral vision loss persists.  She is also had trouble walking because of deconditioning.  She was in rehab to move back to the hospital as well as rehab when he was infected by COVID illness.  She states she is deconditioning.  Plan to do outpatient physical and occupational therapies.  Has chronic back issues and saw her neurosurgeon Dr.  Danielle Dess outpatient therapies.  Improving.  He was seen by cardiologist recently as well.  Patient with short-term memory difficulties since his nonprogressive and secondary proving.  He is tolerating aspirin well without bruising or bleeding.  Her blood pressure is under good she is tolerating Pravachol well without any aches or pains. Update 09/23/2023 : She returns for follow-up after last visit a year ago.  She is accompanied by sister.  Patient is doing well from stroke standpoint without recurrent stroke or TIA symptoms.  She remains on aspirin which is tolerating well without bruising or bleeding.  She is tolerating Pravachol well without muscle aches and pains.  Last lipid profile on 06/25/2023 showed optimal LDL cholesterol at 63 mg percent.  She has undergone loop recorder and so far paroxysmal A-fib has not yet been found.  She underwent EEG on 09/13/2022 which was normal.  Patient continues to have mild cognitive and memory difficulties which she feels are better but she still feels they are unchanged.  She has more trouble with short-term memory though remote memory is okay.  She is still independent actives of daily living.  She still has right-sided peripheral vision loss and was unable to drive.  She has new complaints of recurrent occipital headaches which occur once every 2 to 3 weeks.  She describes it as a sharp severe at times incapacitating.  Accompanied by nausea and light sensitivity.  She takes Fioricet which seems to work quite well.  She does have prior history of migraine headaches but she feels she outgrew them.  She denies any  visual symptoms or focal neurological symptoms accompanying these headaches.  These headaches are quite often triggered by stress.    ROS:   14 system review of systems is positive for joint difficulties, headache, nausea, light sensitivity, balance difficulties, decreased memory chronic back pain and all other systems negative  PMH:  Past Medical History:   Diagnosis Date   Allergy    Anemia    Anxiety    on meds   Back pain    Blood transfusion without reported diagnosis    Cataract    Chronic female pelvic pain    Chronic kidney disease    Coronary artery disease    mild, non-obstructive 11/2018   Depression    on meds   Family history of adverse reaction to anesthesia    sister had difficulty waking up   Fibromyalgia    H/O leukocytosis    Headache    Heart murmur    Hyperlipidemia    on meds   Hypertension    on meds   MI (myocardial infarction) (HCC)    Pt states she did not have a MI- EKG was normal, was GERD   Osteoarthritis    on meds   Ovarian cyst, right    PONV (postoperative nausea and vomiting)    Post-operative nausea and vomiting    Stroke (HCC) 05/14/2022   L PCA   SVD (spontaneous vaginal delivery)    x 2   Vitamin D deficiency     Social History:  Social History   Socioeconomic History   Marital status: Married    Spouse name: don   Number of children: 2   Years of education: College   Highest education level: Not on file  Occupational History   Occupation: Realtor  Tobacco Use   Smoking status: Former    Current packs/day: 0.00    Average packs/day: 0.2 packs/day for 20.0 years (3.0 ttl pk-yrs)    Types: Cigarettes    Start date: 11/12/1978    Quit date: 11/12/1998    Years since quitting: 24.8   Smokeless tobacco: Never  Vaping Use   Vaping status: Never Used  Substance and Sexual Activity   Alcohol use: Not Currently   Drug use: No   Sexual activity: Not Currently    Birth control/protection: Post-menopausal, Surgical    Comment: HYSTERECTOMY  Other Topics Concern   Not on file  Social History Narrative   Lives at home with her husband Don   Right handed   Caffeine: unsweet tea, 2 glasses daily   Social Determinants of Health   Financial Resource Strain: Low Risk  (03/24/2023)   Overall Financial Resource Strain (CARDIA)    Difficulty of Paying Living Expenses: Not very hard   Food Insecurity: No Food Insecurity (03/24/2023)   Hunger Vital Sign    Worried About Running Out of Food in the Last Year: Never true    Ran Out of Food in the Last Year: Never true  Transportation Needs: No Transportation Needs (03/24/2023)   PRAPARE - Administrator, Civil Service (Medical): No    Lack of Transportation (Non-Medical): No  Physical Activity: Insufficiently Active (03/24/2023)   Exercise Vital Sign    Days of Exercise per Week: 3 days    Minutes of Exercise per Session: 20 min  Stress: Stress Concern Present (03/24/2023)   [employer] of Occupational Health - Occupational Stress Questionnaire    Feeling of Stress : To some extent  Social Connections: Unknown (  03/24/2023)   Social Connection and Isolation Panel [NHANES]    Frequency of Communication with Friends and Family: More than three times a week    Frequency of Social Gatherings with Friends and Family: Once a week    Attends Religious Services: Patient declined    Database administrator or Organizations: No    Attends Engineer, structural: Never    Marital Status: Married  Catering manager Violence: Not on file    Medications:   Current Outpatient Medications on File Prior to Visit  Medication Sig Dispense Refill   ALPRAZolam (XANAX) 0.5 MG tablet TAKE 1 TABLET(0.5 MG) BY MOUTH TWICE DAILY AS NEEDED FOR SLEEP OR ANXIETY 30 tablet 2   amLODipine (NORVASC) 10 MG tablet TAKE 1 TABLET(10 MG) BY MOUTH DAILY 90 tablet 3   Ascorbic Acid (VITAMIN C) 1000 MG tablet Take 1,000 mg by mouth in the morning.     aspirin EC 81 MG tablet Take 1 tablet (81 mg total) by mouth daily. Swallow whole. 90 tablet 3   Calcium Carbonate Antacid (TUMS PO) Take 1 tablet by mouth daily as needed (stomach pain).     Cholecalciferol (VITAMIN D) 50 MCG (2000 UT) tablet Take 2,000 Units by mouth in the morning.     cloNIDine (CATAPRES) 0.1 MG tablet TAKE 1 TABLET(0.1 MG) BY MOUTH TWICE DAILY 90 tablet 3   ezetimibe  (ZETIA) 10 MG tablet TAKE 1 TABLET(10 MG) BY MOUTH DAILY 90 tablet 3   hydrALAZINE (APRESOLINE) 100 MG tablet TAKE 1 TABLET(100 MG) BY MOUTH THREE TIMES DAILY 270 tablet 2   HYDROcodone-acetaminophen (NORCO) 10-325 MG tablet Take 1 tablet by mouth every 6 (six) hours as needed.     methocarbamol (ROBAXIN) 500 MG tablet Take 1 tablet (500 mg total) by mouth every 6 (six) hours as needed for muscle spasms. 30 tablet 3   ondansetron (ZOFRAN) 4 MG tablet TAKE 1 TABLET(4 MG) BY MOUTH EVERY 4 HOURS AS NEEDED FOR NAUSEA OR VOMITING 90 tablet 1   ondansetron (ZOFRAN) 4 MG tablet TAKE 1 TABLET(4 MG) BY MOUTH EVERY 4 HOURS AS NEEDED FOR NAUSEA OR VOMITING 90 tablet 1   pantoprazole (PROTONIX) 40 MG tablet TAKE 1 TABLET(40 MG) BY MOUTH DAILY 90 tablet 3   Potassium Chloride ER 20 MEQ TBCR TAKE 1 TABLET BY MOUTH IN THE MORNING AND AT BEDTIME 180 tablet 0   pravastatin (PRAVACHOL) 40 MG tablet Take 1 tablet (40 mg total) by mouth every evening. 90 tablet 3   spironolactone (ALDACTONE) 25 MG tablet Take 25 mg by mouth daily.     valACYclovir (VALTREX) 500 MG tablet TAKE 1 TABLET BY MOUTH TWICE DAILY FOR 3 TO 5 DAYS THEN TAKE DAILY AS NEEDED 30 tablet 0   No current facility-administered medications on file prior to visit.    Allergies:   Allergies  Allergen Reactions   Lidocaine Hives   Zanaflex [Tizanidine] Anaphylaxis, Hives, Itching and Swelling   Neurontin [Gabapentin] Itching and Swelling   Latex Rash    Physical Exam General: well developed, well nourished pleasant elderly Caucasian lady, seated, in no evident distress Head: head normocephalic and atraumatic.  Neck: supple with no carotid or supraclavicular bruits Cardiovascular: regular rate and rhythm, no murmurs Musculoskeletal: no deformity Skin:  no rash/petichiae Vascular:  Normal pulses all extremities Vitals:   09/23/23 1432  BP: (!) 164/66  Pulse: 65   Neurologic Exam Mental Status: Awake and fully alert. Oriented to place and  time. Recent and remote  memory intact. Attention span, concentration and fund of knowledge appropriate. Mood and affect appropriate.  Diminished recall 2/3.  Able to name only 11 animals which can walk on 4 legs.  Clock drawing 4/4. Cranial Nerves: Fundoscopic exam not done. Pupils equal, briskly reactive to light. Extraocular movements full without nystagmus. Visual fields l to confrontation. Hearing intact. Facial sensation intact. Face, tongue, palate moves normally and symmetrically.  Motor: Normal bulk and tone. Normal strength in all tested extremity muscles. Sensory.: intact to touch ,pinprick .position and vibratory sensation.  Coordination: Rapid alternating movements normal in all extremities. Finger-to-nose and heel-to-shin performed accurately bilaterally. Gait and Station: Arises from chair without difficulty. Stance is normal. Gait demonstrates normal stride length and balance and uses a walker.. Able to heel, toe and tandem walk with slight Reflexes: 1+ and symmetric. Toes downgoing.   NIHSS  2 Modified Rankin  2     09/23/2023    3:10 PM  MMSE - Mini Mental State Exam  Orientation to time 5  Orientation to Place 5  Registration 3  Attention/ Calculation 3  Recall 2  Language- name 2 objects 2  Language- repeat 1  Language- follow 3 step command 2  Language- read & follow direction 1  Write a sentence 1  Copy design 1  Total score 26     ASSESSMENT: 72 year old Caucasian lady with left PCA subacute infarct and July 2023 of cryptogenic etiology.  He has significant residual right-sided numbness and. and mild memory and cognitive impairment.  Vascular risk factors hypertension.  She also has tiny asymptomatic right ICA aneurysm.  New complaints of recurrent occipital headaches likely migrainous given prior history of migraines.     PLAN:I had a long d/w patient and her sister about her  cryptogenic stroke, memory loss and right sided visual field loss, migraine  headaches,,risk for recurrent stroke/TIAs, personally independently reviewed imaging studies and stroke evaluation results and answered questions.Continue aspirin 81 mg daily  for secondary stroke prevention and maintain strict control of hypertension with blood pressure goal below 130/90, diabetes with hemoglobin A1c goal below 6.5% and lipids with LDL cholesterol goal below 70 mg/dL. I also advised the patient to eat a healthy diet with plenty of whole grains, cereals, fruits and vegetables, exercise regularly and maintain ideal body weight.   I advised the patient to participate in cognitively challenging activities like solving crossword puzzles, playing bridge and sudoku. Continue Fioricet prn for migraines and avoid triggers. Particpate in regular stress relaxation activities like mediitation, yoga and exercises..  Followup in the future with my nurse practitioner in 6 months or call earlier if necessary. Greater than 50% of time during this 40 minute prolonged visit was spent on counseling,explanation of diagnosis, planning of further management, discussion with patient and family and coordination of care Delia Heady, MD Note: This document was prepared with digital dictation and possible smart phrase technology. Any transcriptional errors that result from this process are unintentional

## 2023-09-23 NOTE — Patient Instructions (Addendum)
:  I had a long d/w patient and her sister about her  cryptogenic stroke, memory loss and right sided visual field loss, migraine headaches,,risk for recurrent stroke/TIAs, personally independently reviewed imaging studies and stroke evaluation results and answered questions.Continue aspirin 81 mg daily  for secondary stroke prevention and maintain strict control of hypertension with blood pressure goal below 130/90, diabetes with hemoglobin A1c goal below 6.5% and lipids with LDL cholesterol goal below 70 mg/dL. I also advised the patient to eat a healthy diet with plenty of whole grains, cereals, fruits and vegetables, exercise regularly and maintain ideal body weight.   I advised the patient to participate in cognitively challenging activities like solving crossword puzzles, playing bridge and sudoku.Recommend f/u screening MRA brain for aneurysm surveillance. Continue Fioricet prn for migraines and avoid triggers. Particpate in regular stress relaxation activities like mediitation, yoga and exercises..  Followup in the future with my nurse practitioner in 6 months or call earlier if necessary.

## 2023-09-24 ENCOUNTER — Telehealth: Payer: Self-pay | Admitting: Neurology

## 2023-09-24 NOTE — Telephone Encounter (Signed)
Order sent to [facility], they will reach out to schedule patient. [phone]

## 2023-09-30 DIAGNOSIS — G894 Chronic pain syndrome: Secondary | ICD-10-CM | POA: Diagnosis not present

## 2023-09-30 DIAGNOSIS — Z79891 Long term (current) use of opiate analgesic: Secondary | ICD-10-CM | POA: Diagnosis not present

## 2023-09-30 NOTE — Progress Notes (Signed)
Carelink Summary Report / Loop Recorder 

## 2023-10-01 DIAGNOSIS — G5701 Lesion of sciatic nerve, right lower limb: Secondary | ICD-10-CM | POA: Diagnosis not present

## 2023-10-01 DIAGNOSIS — M961 Postlaminectomy syndrome, not elsewhere classified: Secondary | ICD-10-CM | POA: Diagnosis not present

## 2023-10-01 DIAGNOSIS — M25551 Pain in right hip: Secondary | ICD-10-CM | POA: Diagnosis not present

## 2023-10-01 DIAGNOSIS — G894 Chronic pain syndrome: Secondary | ICD-10-CM | POA: Diagnosis not present

## 2023-10-01 DIAGNOSIS — Z79891 Long term (current) use of opiate analgesic: Secondary | ICD-10-CM | POA: Diagnosis not present

## 2023-10-02 ENCOUNTER — Ambulatory Visit: Payer: PPO | Admitting: Family Medicine

## 2023-10-02 ENCOUNTER — Encounter: Payer: Self-pay | Admitting: Family Medicine

## 2023-10-02 VITALS — BP 140/70 | HR 53 | Temp 97.7°F | Resp 16 | Ht 64.0 in | Wt 151.2 lb

## 2023-10-02 DIAGNOSIS — F419 Anxiety disorder, unspecified: Secondary | ICD-10-CM

## 2023-10-02 DIAGNOSIS — I10 Essential (primary) hypertension: Secondary | ICD-10-CM

## 2023-10-02 DIAGNOSIS — G43009 Migraine without aura, not intractable, without status migrainosus: Secondary | ICD-10-CM | POA: Diagnosis not present

## 2023-10-02 MED ORDER — ALPRAZOLAM 0.5 MG PO TABS: ORAL_TABLET | ORAL | 2 refills | Status: DC

## 2023-10-02 MED ORDER — BUTALBITAL-APAP-CAFFEINE 50-325-40 MG PO TABS: 1.0000 | ORAL_TABLET | Freq: Four times a day (QID) | ORAL | 2 refills | Status: DC | PRN

## 2023-10-02 NOTE — Patient Instructions (Signed)

## 2023-10-02 NOTE — Assessment & Plan Note (Signed)
Chronic.  Mostly controlled.  Managed by nephrology.  Continue amlodipine 10 mg, clonidine 0.1 mg twice daily, hydralazine 100 mg 3 times daily, spironolactone 25 mg daily

## 2023-10-02 NOTE — Assessment & Plan Note (Signed)
Chronic.  Working on minimizing fioricet-taking 3x/month but re-dosing.  Can't do triptans d/t CAD/CVA.  Declines ubrelvy/nurtec.

## 2023-10-02 NOTE — Assessment & Plan Note (Signed)
Chronic.  A lot of stressors in her life.  Fair control.  She does take Xanax 0.5 mg twice daily, however is going to try to decrease to just 1/day due to risks and interactions with other medicines.

## 2023-10-02 NOTE — Progress Notes (Signed)
Subjective:    Patient ID: Robin Arellano, female    DOB: Apr 23, 1951, 72 y.o.   MRN: [id number]  Chief Complaint  Patient presents with   Medical Management of Chronic Issues    3 month follow-up on moods    HPI - Accompanied by her sister. Using cane.   HTN -  Pt is on amlodipine 10 mg, clonidine 0.1 mg bid, hydralazine 100 mg three times daily, and spironolactone 25 mg. Bp's running 130s/?. No dizziness/cp/palp/edema/cough/sob. Bp at initial check was 166/70. Bp at recheck was 140/70.managed by neph/card  Anxiety - Managing with xanax 0.5 mg BID PRN. Doing well. No SI. PDMP checked.   Migraines - Will continue Fioricet 50-325-40 mg every 6 hours PRN along with Tylenol. Will be receiving brain scan per neuro. Doesn't want any more meds. Has around three migraines a month. Is slightly sensitive to light. Was offered nurtec/ubrelvy by neuro/pain mgmt/myself, but wants to stay w/fioricet.  Can't do triptans d/t CAD.  Still experiencing fatigue a lot.   Health Maintenance Due  Topic Date Due   Medicare Annual Wellness (AWV)  10/17/2023    Past Medical History:  Diagnosis Date   Allergy    Anemia    Anxiety    on meds   Back pain    Blood transfusion without reported diagnosis    Cataract    Chronic female pelvic pain    Chronic kidney disease    Coronary artery disease    mild, non-obstructive 11/2018   Depression    on meds   Family history of adverse reaction to anesthesia    sister had difficulty waking up   Fibromyalgia    H/O leukocytosis    Headache    Heart murmur    Hyperlipidemia    on meds   Hypertension    on meds   MI (myocardial infarction) (HCC)    Pt states she did not have a MI- EKG was normal, was GERD   Osteoarthritis    on meds   Ovarian cyst, right    PONV (postoperative nausea and vomiting)    Post-operative nausea and vomiting    Stroke (HCC) 05/14/2022   L PCA   SVD (spontaneous vaginal delivery)    x 2   Vitamin D deficiency      Past Surgical History:  Procedure Laterality Date   ABDOMINAL HYSTERECTOMY  1994   TAH.BSO   ANTERIOR CERVICAL DECOMP/DISCECTOMY FUSION  2019   APPENDECTOMY  1975   BACK SURGERY  2023   BIOPSY  05/12/2020   Procedure: BIOPSY;  Surgeon: Rachael Fee, MD;  Location: [facility];  Service: Endoscopy;;   CARDIAC CATHETERIZATION  2020   CHOLECYSTECTOMY N/A 07/21/2020   Procedure: LAPAROSCOPIC CHOLECYSTECTOMY WITH INTRAOPERATIVE CHOLANGIOGRAM;  Surgeon: Almond Lint, MD;  Location: [facility] OR;  Service: General;  Laterality: N/A;   COLONOSCOPY  08/12/2017   Hx TA (piecemeal)Jacobs-MAC-suprep (good)   ESOPHAGOGASTRODUODENOSCOPY (EGD) WITH PROPOFOL N/A 05/12/2020   Procedure: ESOPHAGOGASTRODUODENOSCOPY (EGD) WITH PROPOFOL;  Surgeon: Rachael Fee, MD;  Location: [facility];  Service: Endoscopy;  Laterality: N/A;   EUS N/A 05/12/2020   Procedure: UPPER ENDOSCOPIC ULTRASOUND (EUS) RADIAL;  Surgeon: Rachael Fee, MD;  Location: [facility];  Service: Endoscopy;  Laterality: N/A;   KNEE SURGERY Bilateral 1996   x 2 - arthroscopic   LEFT HEART CATH AND CORONARY ANGIOGRAPHY N/A 11/25/2018   Procedure: LEFT HEART CATH AND CORONARY ANGIOGRAPHY;  Surgeon: Lennette Bihari, MD;  Location:  [facility];  Service: Cardiovascular;  Laterality: N/A;   PELVIC LAPAROSCOPY  1989   W LYSIS OF ADHESIONS/L SALPINGONEOSTOMY   TUBAL LIGATION     WISDOM TOOTH EXTRACTION       Current Outpatient Medications:    amLODipine (NORVASC) 10 MG tablet, TAKE 1 TABLET(10 MG) BY MOUTH DAILY, Disp: 90 tablet, Rfl: 3   Ascorbic Acid (VITAMIN C) 1000 MG tablet, Take 1,000 mg by mouth in the morning., Disp: , Rfl:    aspirin EC 81 MG tablet, Take 1 tablet (81 mg total) by mouth daily. Swallow whole., Disp: 90 tablet, Rfl: 3   Calcium Carbonate Antacid (TUMS PO), Take 1 tablet by mouth daily as needed (stomach pain)., Disp: , Rfl:    Cholecalciferol (VITAMIN D) 50 MCG (2000 UT) tablet, Take 2,000 Units by  mouth in the morning., Disp: , Rfl:    cloNIDine (CATAPRES) 0.1 MG tablet, TAKE 1 TABLET(0.1 MG) BY MOUTH TWICE DAILY, Disp: 90 tablet, Rfl: 3   ezetimibe (ZETIA) 10 MG tablet, TAKE 1 TABLET(10 MG) BY MOUTH DAILY, Disp: 90 tablet, Rfl: 3   hydrALAZINE (APRESOLINE) 100 MG tablet, TAKE 1 TABLET(100 MG) BY MOUTH THREE TIMES DAILY, Disp: 270 tablet, Rfl: 2   HYDROcodone-acetaminophen (NORCO) 10-325 MG tablet, Take 1 tablet by mouth every 6 (six) hours as needed., Disp: , Rfl:    methocarbamol (ROBAXIN) 500 MG tablet, Take 1 tablet (500 mg total) by mouth every 6 (six) hours as needed for muscle spasms., Disp: 30 tablet, Rfl: 3   ondansetron (ZOFRAN) 4 MG tablet, TAKE 1 TABLET(4 MG) BY MOUTH EVERY 4 HOURS AS NEEDED FOR NAUSEA OR VOMITING, Disp: 90 tablet, Rfl: 1   ondansetron (ZOFRAN) 4 MG tablet, TAKE 1 TABLET(4 MG) BY MOUTH EVERY 4 HOURS AS NEEDED FOR NAUSEA OR VOMITING, Disp: 90 tablet, Rfl: 1   pantoprazole (PROTONIX) 40 MG tablet, TAKE 1 TABLET(40 MG) BY MOUTH DAILY, Disp: 90 tablet, Rfl: 3   Potassium Chloride ER 20 MEQ TBCR, TAKE 1 TABLET BY MOUTH IN THE MORNING AND AT BEDTIME, Disp: 180 tablet, Rfl: 0   pravastatin (PRAVACHOL) 40 MG tablet, Take 1 tablet (40 mg total) by mouth every evening., Disp: 90 tablet, Rfl: 3   spironolactone (ALDACTONE) 25 MG tablet, Take 25 mg by mouth daily., Disp: , Rfl:    valACYclovir (VALTREX) 500 MG tablet, TAKE 1 TABLET BY MOUTH TWICE DAILY FOR 3 TO 5 DAYS THEN TAKE DAILY AS NEEDED, Disp: 30 tablet, Rfl: 0   ALPRAZolam (XANAX) 0.5 MG tablet, TAKE 1 TABLET(0.5 MG) BY MOUTH TWICE DAILY AS NEEDED FOR SLEEP OR ANXIETY, Disp: 30 tablet, Rfl: 2   butalbital-acetaminophen-caffeine (FIORICET) 50-325-40 MG tablet, Take 1 tablet by mouth every 6 (six) hours as needed for headache., Disp: 15 tablet, Rfl: 2  Allergies  Allergen Reactions   Lidocaine Hives   Zanaflex [Tizanidine] Anaphylaxis, Hives, Itching and Swelling   Neurontin [Gabapentin] Itching and Swelling   Latex  Rash   ROS neg/noncontributory except as noted HPI/below    Objective:     BP (!) 140/70 (BP Location: Left Arm, Patient Position: Sitting, Cuff Size: Large)   Pulse (!) 53   Temp 97.7 F (36.5 C) (Temporal)   Resp 16   Ht 5\' 4"  (1.626 m)   Wt 151 lb 4 oz (68.6 kg)   LMP  (LMP Unknown)   SpO2 96%   BMI 25.96 kg/m  Wt Readings from Last 3 Encounters:  10/02/23 151 lb 4 oz (68.6 kg)  09/23/23 152 lb (68.9 kg)  08/23/23 152 lb (68.9 kg)    Physical Exam   Gen: [facility]N NAD HEENT: NCAT, conjunctiva not injected, sclera nonicteric NECK:  supple, no thyromegaly, no nodes, no carotid bruits CARDIAC: RRR, S1S2+, +2/6 murmur. Pulses palpable  LUNGS: CTAB. No wheezes ABDOMEN:  BS+, soft, NTND, No HSM, no masses EXT:  no edema MSK: no gross abnormalities.  NEURO: A&O x3.  CN II-XII intact.  PSYCH: normal mood. Good eye contact  Reviewed neuro note     Assessment & Plan:  Essential hypertension Assessment & Plan: Chronic.  Mostly controlled.  Managed by nephrology.  Continue amlodipine 10 mg, clonidine 0.1 mg twice daily, hydralazine 100 mg 3 times daily, spironolactone 25 mg daily   Migraine without aura and without status migrainosus, not intractable Assessment & Plan: Chronic.  Working on minimizing fioricet-taking 3x/month but re-dosing.  Can't do triptans d/t CAD/CVA.  Declines ubrelvy/nurtec.     Anxiety Assessment & Plan: Chronic.  A lot of stressors in her life.  Fair control.  She does take Xanax 0.5 mg twice daily, however is going to try to decrease to just 1/day due to risks and interactions with other medicines.   Other orders -     ALPRAZolam; TAKE 1 TABLET(0.5 MG) BY MOUTH TWICE DAILY AS NEEDED FOR SLEEP OR ANXIETY  Dispense: 30 tablet; Refill: 2 -     Butalbital-APAP-Caffeine; Take 1 tablet by mouth every 6 (six) hours as needed for headache.  Dispense: 15 tablet; Refill: 2    Return in about 3 months (around 01/02/2024) for chronic follow-up.  Germaine Pomfret Rice,acting as a scribe for Angelena Sole, MD.,have documented all relevant documentation on the behalf of Angelena Sole, MD,as directed by  Angelena Sole, MD while in the presence of Angelena Sole, MD.  I, Angelena Sole, MD, have reviewed all documentation for this visit. The documentation on 10/02/23 for the exam, diagnosis, procedures, and orders are all accurate and complete.   Angelena Sole, MD

## 2023-10-05 ENCOUNTER — Ambulatory Visit
Admission: RE | Admit: 2023-10-05 | Discharge: 2023-10-05 | Disposition: A | Discharge status: Home / Self Care | Payer: PPO | Source: Ambulatory Visit | Attending: Neurology | Admitting: Neurology

## 2023-10-05 DIAGNOSIS — K219 Gastro-esophageal reflux disease without esophagitis: Secondary | ICD-10-CM | POA: Diagnosis not present

## 2023-10-05 DIAGNOSIS — G43009 Migraine without aura, not intractable, without status migrainosus: Secondary | ICD-10-CM | POA: Diagnosis not present

## 2023-10-05 DIAGNOSIS — Z8673 Personal history of transient ischemic attack (TIA), and cerebral infarction without residual deficits: Secondary | ICD-10-CM

## 2023-10-05 DIAGNOSIS — E782 Mixed hyperlipidemia: Secondary | ICD-10-CM | POA: Diagnosis not present

## 2023-10-05 DIAGNOSIS — I10 Essential (primary) hypertension: Secondary | ICD-10-CM | POA: Diagnosis not present

## 2023-10-07 ENCOUNTER — Inpatient Hospital Stay: Payer: PPO | Attending: Oncology

## 2023-10-07 ENCOUNTER — Inpatient Hospital Stay (HOSPITAL_BASED_OUTPATIENT_CLINIC_OR_DEPARTMENT_OTHER): Payer: PPO | Admitting: Oncology

## 2023-10-07 VITALS — BP 152/68 | HR 64 | Temp 98.1°F | Resp 18 | Ht 64.0 in | Wt 152.2 lb

## 2023-10-07 DIAGNOSIS — Z8673 Personal history of transient ischemic attack (TIA), and cerebral infarction without residual deficits: Secondary | ICD-10-CM | POA: Insufficient documentation

## 2023-10-07 DIAGNOSIS — D631 Anemia in chronic kidney disease: Secondary | ICD-10-CM

## 2023-10-07 DIAGNOSIS — N189 Chronic kidney disease, unspecified: Secondary | ICD-10-CM | POA: Insufficient documentation

## 2023-10-07 DIAGNOSIS — M069 Rheumatoid arthritis, unspecified: Secondary | ICD-10-CM | POA: Diagnosis not present

## 2023-10-07 DIAGNOSIS — I129 Hypertensive chronic kidney disease with stage 1 through stage 4 chronic kidney disease, or unspecified chronic kidney disease: Secondary | ICD-10-CM | POA: Insufficient documentation

## 2023-10-07 DIAGNOSIS — D649 Anemia, unspecified: Secondary | ICD-10-CM | POA: Diagnosis not present

## 2023-10-07 DIAGNOSIS — Z87891 Personal history of nicotine dependence: Secondary | ICD-10-CM | POA: Insufficient documentation

## 2023-10-07 LAB — CMP (CANCER CENTER ONLY)
ALT: 5 U/L (ref 0–44)
AST: 13 U/L — ABNORMAL LOW (ref 15–41)
Albumin: 4.5 g/dL (ref 3.5–5.0)
Alkaline Phosphatase: 102 U/L (ref 38–126)
Anion gap: 9 (ref 5–15)
BUN: 24 mg/dL — ABNORMAL HIGH (ref 8–23)
CO2: 22 mmol/L (ref 22–32)
Calcium: 9.2 mg/dL (ref 8.9–10.3)
Chloride: 105 mmol/L (ref 98–111)
Creatinine: 1.5 mg/dL — ABNORMAL HIGH (ref 0.44–1.00)
GFR, Estimated: 37 mL/min — ABNORMAL LOW (ref >60)
Glucose, Bld: 104 mg/dL — ABNORMAL HIGH (ref 70–99)
Potassium: 4.5 mmol/L (ref 3.5–5.1)
Sodium: 136 mmol/L (ref 135–145)
Total Bilirubin: 0.3 mg/dL (ref <1.2)
Total Protein: 7.8 g/dL (ref 6.5–8.1)

## 2023-10-07 LAB — CBC WITH DIFFERENTIAL (CANCER CENTER ONLY)
Abs Immature Granulocytes: 0.03 10*3/uL (ref 0.00–0.07)
Basophils Absolute: 0.1 10*3/uL (ref 0.0–0.1)
Basophils Relative: 1 %
Eosinophils Absolute: 0.2 10*3/uL (ref 0.0–0.5)
Eosinophils Relative: 2 %
HCT: 33.4 % — ABNORMAL LOW (ref 36.0–46.0)
Hemoglobin: 11 g/dL — ABNORMAL LOW (ref 12.0–15.0)
Immature Granulocytes: 0 %
Lymphocytes Relative: 16 %
Lymphs Abs: 1.6 10*3/uL (ref 0.7–4.0)
MCH: 32.3 pg (ref 26.0–34.0)
MCHC: 32.9 g/dL (ref 30.0–36.0)
MCV: 97.9 fL (ref 80.0–100.0)
Monocytes Absolute: 0.8 10*3/uL (ref 0.1–1.0)
Monocytes Relative: 8 %
Neutro Abs: 7.2 10*3/uL (ref 1.7–7.7)
Neutrophils Relative %: 73 %
Platelet Count: 249 10*3/uL (ref 150–400)
RBC: 3.41 MIL/uL — ABNORMAL LOW (ref 3.87–5.11)
RDW: 13 % (ref 11.5–15.5)
WBC Count: 9.9 10*3/uL (ref 4.0–10.5)
nRBC: 0 % (ref 0.0–0.2)

## 2023-10-07 NOTE — Progress Notes (Signed)
  [facility] OFFICE PROGRESS NOTE   Diagnosis: Anemia  INTERVAL HISTORY:   Robin Arellano returns as scheduled.  She reports malaise.  No bleeding.  She complains of back, leg, and joint pain.  She is scheduled for a rheumatology evaluation within the next few weeks.  Objective:  Vital signs in last 24 hours:  Blood pressure (!) 152/68, pulse 64, temperature 98.1 F (36.7 C), temperature source Temporal, resp. rate 18, height 5\' 4"  (1.626 m), weight 152 lb 3.2 oz (69 kg), SpO2 98%.    Lymphatics: No cervical, supraclavicular, axillary, or inguinal nodes Resp: Lungs clear bilaterally Cardio: Regular rate and rhythm GI: No hepatosplenomegaly Vascular: No leg edema    Lab Results:  Lab Results  Component Value Date   WBC 9.9 10/07/2023   HGB 11.0 (L) 10/07/2023   HCT 33.4 (L) 10/07/2023   MCV 97.9 10/07/2023   PLT 249 10/07/2023   NEUTROABS 7.2 10/07/2023    CMP  Lab Results  Component Value Date   NA 137 04/11/2023   K 4.1 04/11/2023   CL 106 04/11/2023   CO2 20 (L) 04/11/2023   GLUCOSE 95 04/11/2023   BUN 28 (H) 04/11/2023   CREATININE 1.73 (H) 04/11/2023   CALCIUM 9.5 04/11/2023   PROT 8.1 04/11/2023   ALBUMIN 4.5 04/11/2023   AST 15 04/11/2023   ALT 7 04/11/2023   ALKPHOS 106 04/11/2023   BILITOT 0.3 04/11/2023   GFRNONAA 31 (L) 04/11/2023   GFRAA 56 (L) 10/13/2020   Medications: I have reviewed the patient's current medications.   Assessment/Plan: Anemia 09/06/2022 SPEP/IFE-no M spike, polyclonal increase in 1 or more immunoglobulins, IgA mildly elevated, IgG and IgM normal; serum light chains kappa and lambda both elevated with normal ratio; erythropoietin mildly elevated; B12 normal; LDH normal; creatinine elevated at 1.67 Chronic kidney disease Hypertension History of stroke History of pancreatitis Rheumatoid arthritis    Disposition: Robin Arellano appears stable.  She has stable mild normocytic anemia.  Anemia is likely secondary  to renal sufficiency and "chronic "disease.  She has rheumatoid arthritis.  She is scheduled to see rheumatology within the next few weeks.  We will consider a trial of erythropoietin and a diagnostic bone marrow biopsy if she develops progressive anemia.  She would like to continue follow-up with hematology clinic.  She will return for an office visit and CBC in 9 months.  Thornton Papas, MD  10/07/2023  3:02 PM

## 2023-10-13 NOTE — Progress Notes (Signed)
Kindly inform patient that MR angiogram study of the brain blood vessel shows no major blockages.

## 2023-10-14 ENCOUNTER — Ambulatory Visit (INDEPENDENT_AMBULATORY_CARE_PROVIDER_SITE_OTHER): Payer: PPO

## 2023-10-14 DIAGNOSIS — I639 Cerebral infarction, unspecified: Secondary | ICD-10-CM

## 2023-10-14 DIAGNOSIS — Z6827 Body mass index (BMI) 27.0-27.9, adult: Secondary | ICD-10-CM | POA: Diagnosis not present

## 2023-10-14 DIAGNOSIS — M797 Fibromyalgia: Secondary | ICD-10-CM | POA: Diagnosis not present

## 2023-10-14 DIAGNOSIS — M5136 Other intervertebral disc degeneration, lumbar region with discogenic back pain only: Secondary | ICD-10-CM | POA: Diagnosis not present

## 2023-10-14 DIAGNOSIS — M7989 Other specified soft tissue disorders: Secondary | ICD-10-CM | POA: Diagnosis not present

## 2023-10-14 DIAGNOSIS — M1991 Primary osteoarthritis, unspecified site: Secondary | ICD-10-CM | POA: Diagnosis not present

## 2023-10-14 DIAGNOSIS — R700 Elevated erythrocyte sedimentation rate: Secondary | ICD-10-CM | POA: Diagnosis not present

## 2023-10-14 DIAGNOSIS — E663 Overweight: Secondary | ICD-10-CM | POA: Diagnosis not present

## 2023-10-14 DIAGNOSIS — M2559 Pain in other specified joint: Secondary | ICD-10-CM | POA: Diagnosis not present

## 2023-10-14 LAB — CUP PACEART REMOTE DEVICE CHECK
Date Time Interrogation Session: 20241129230700
Implantable Pulse Generator Implant Date: 20231117

## 2023-10-23 ENCOUNTER — Ambulatory Visit: Payer: PPO

## 2023-10-23 VITALS — Wt 152.0 lb

## 2023-10-23 DIAGNOSIS — Z0000 Encounter for general adult medical examination without abnormal findings: Secondary | ICD-10-CM

## 2023-10-23 NOTE — Patient Instructions (Signed)
Robin Arellano , Thank you for taking time to come for your Medicare Wellness Visit. I appreciate your ongoing commitment to your health goals. Please review the following plan we discussed and let me know if I can assist you in the future.   Referrals/Orders/Follow-Ups/Clinician Recommendations: maintain health and activity   This is a list of the screening recommended for you and due dates:  Health Maintenance  Topic Date Due   Pneumonia Vaccine (1 of 2 - PCV) Never done   Medicare Annual Wellness Visit  10/17/2023   COVID-19 Vaccine (3 - Pfizer risk series) 11/04/2023*   DTaP/Tdap/Td vaccine (1 - Tdap) 11/07/2023*   Zoster (Shingles) Vaccine (1 of 2) 11/07/2023*   Flu Shot  02/10/2024*   Mammogram  10/01/2024*   Colon Cancer Screening  03/29/2026   DEXA scan (bone density measurement)  Completed   Hepatitis C Screening  Completed   HPV Vaccine  Aged Out  *Topic was postponed. The date shown is not the original due date.    Advanced directives: (Copy Requested) Please bring a copy of your health care power of attorney and living will to the office to be added to your chart at your convenience.  Next Medicare Annual Wellness Visit scheduled for next year: Yes

## 2023-10-23 NOTE — Progress Notes (Signed)
Subjective:   Robin Arellano is a 72 y.o. female who presents for Medicare Annual (Subsequent) preventive examination.  Visit Complete: Virtual I connected with  Robin Arellano on 10/23/23 by a audio enabled telemedicine application and verified that I am speaking with the correct person using two identifiers.  Patient Location: Home  Provider Location: Home Office  I discussed the limitations of evaluation and management by telemedicine. The patient expressed understanding and agreed to proceed.  Vital Signs: Because this visit was a virtual/telehealth visit, some criteria may be missing or patient reported. Any vitals not documented were not able to be obtained and vitals that have been documented are patient reported.  Patient Medicare AWV questionnaire was completed by the patient on 10/19/23; I have confirmed that all information answered by patient is correct and no changes since this date.  Cardiac Risk Factors include: advanced age (>65men, >60 women);dyslipidemia;hypertension     Objective:    Today's Vitals   10/23/23 1459  Weight: 152 lb (68.9 kg)   Body mass index is 26.09 kg/m.     10/23/2023    3:11 PM 10/07/2023    2:53 PM 04/11/2023    3:14 PM 11/23/2022   11:39 AM 10/16/2022    2:20 PM 09/21/2022   11:27 AM 09/13/2022    2:17 PM  Advanced Directives  Does Patient Have a Medical Advance Directive? Yes Yes Yes Yes Yes No No  Type of Estate agent of [place];Living will Healthcare Power of [place];Living will Healthcare Power of [place];Living will Healthcare Power of [place];Living will Healthcare Power of [place];Living will    Does patient want to make changes to medical advance directive?  No - Patient declined No - Patient declined      Copy of Healthcare Power of Attorney in Chart? No - copy requested No - copy requested No - copy requested No - copy requested No - copy requested    Would patient like information on  creating a medical advance directive?      No - Patient declined Yes (MAU/Ambulatory/Procedural Areas - Information given)    Current Medications (verified) Outpatient Encounter Medications as of 10/23/2023  Medication Sig   ALPRAZolam (XANAX) 0.5 MG tablet TAKE 1 TABLET(0.5 MG) BY MOUTH TWICE DAILY AS NEEDED FOR SLEEP OR ANXIETY   amLODipine (NORVASC) 10 MG tablet TAKE 1 TABLET(10 MG) BY MOUTH DAILY   Ascorbic Acid (VITAMIN C) 1000 MG tablet Take 1,000 mg by mouth in the morning.   aspirin EC 81 MG tablet Take 1 tablet (81 mg total) by mouth daily. Swallow whole.   butalbital-acetaminophen-caffeine (FIORICET) 50-325-40 MG tablet Take 1 tablet by mouth every 6 (six) hours as needed for headache.   Calcium Carbonate Antacid (TUMS PO) Take 1 tablet by mouth daily as needed (stomach pain).   Cholecalciferol (VITAMIN D) 50 MCG (2000 UT) tablet Take 2,000 Units by mouth in the morning.   cloNIDine (CATAPRES) 0.1 MG tablet TAKE 1 TABLET(0.1 MG) BY MOUTH TWICE DAILY   ezetimibe (ZETIA) 10 MG tablet TAKE 1 TABLET(10 MG) BY MOUTH DAILY   hydrALAZINE (APRESOLINE) 100 MG tablet TAKE 1 TABLET(100 MG) BY MOUTH THREE TIMES DAILY   HYDROcodone-acetaminophen (NORCO) 10-325 MG tablet Take 1 tablet by mouth every 6 (six) hours as needed.   methocarbamol (ROBAXIN) 500 MG tablet Take 1 tablet (500 mg total) by mouth every 6 (six) hours as needed for muscle spasms.   pantoprazole (PROTONIX) 40 MG tablet TAKE 1 TABLET(40 MG) BY MOUTH  DAILY   Potassium Chloride ER 20 MEQ TBCR TAKE 1 TABLET BY MOUTH IN THE MORNING AND AT BEDTIME   pravastatin (PRAVACHOL) 40 MG tablet Take 1 tablet (40 mg total) by mouth every evening.   spironolactone (ALDACTONE) 25 MG tablet Take 25 mg by mouth daily.   valACYclovir (VALTREX) 500 MG tablet TAKE 1 TABLET BY MOUTH TWICE DAILY FOR 3 TO 5 DAYS THEN TAKE DAILY AS NEEDED   [DISCONTINUED] ondansetron (ZOFRAN) 4 MG tablet TAKE 1 TABLET(4 MG) BY MOUTH EVERY 4 HOURS AS NEEDED FOR NAUSEA OR  VOMITING (Patient not taking: Reported on 10/07/2023)   No facility-administered encounter medications on file as of 10/23/2023.    Allergies (verified) Lidocaine, Zanaflex [tizanidine], Neurontin [gabapentin], and Latex   History: Past Medical History:  Diagnosis Date   Allergy    Anemia    Anxiety    on meds   Back pain    Blood transfusion without reported diagnosis    Cataract    Chronic female pelvic pain    Chronic kidney disease    Coronary artery disease    mild, non-obstructive 11/2018   Depression    on meds   Family history of adverse reaction to anesthesia    sister had difficulty waking up   Fibromyalgia    H/O leukocytosis    Headache    Heart murmur    Hyperlipidemia    on meds   Hypertension    on meds   MI (myocardial infarction) (HCC)    Pt states she did not have a MI- EKG was normal, was GERD   Osteoarthritis    on meds   Ovarian cyst, right    PONV (postoperative nausea and vomiting)    Post-operative nausea and vomiting    Stroke (HCC) 05/14/2022   L PCA   SVD (spontaneous vaginal delivery)    x 2   Vitamin D deficiency    Past Surgical History:  Procedure Laterality Date   ABDOMINAL HYSTERECTOMY  1994   TAH.BSO   ANTERIOR CERVICAL DECOMP/DISCECTOMY FUSION  2019   APPENDECTOMY  1975   BACK SURGERY  2023   BIOPSY  05/12/2020   Procedure: BIOPSY;  Surgeon: Rachael Fee, MD;  Location: [facility];  Service: Endoscopy;;   CARDIAC CATHETERIZATION  2020   CHOLECYSTECTOMY N/A 07/21/2020   Procedure: LAPAROSCOPIC CHOLECYSTECTOMY WITH INTRAOPERATIVE CHOLANGIOGRAM;  Surgeon: Almond Lint, MD;  Location: [facility] OR;  Service: General;  Laterality: N/A;   COLONOSCOPY  08/12/2017   Hx TA (piecemeal)Jacobs-MAC-suprep (good)   ESOPHAGOGASTRODUODENOSCOPY (EGD) WITH PROPOFOL N/A 05/12/2020   Procedure: ESOPHAGOGASTRODUODENOSCOPY (EGD) WITH PROPOFOL;  Surgeon: Rachael Fee, MD;  Location: [facility];  Service: Endoscopy;  Laterality: N/A;    EUS N/A 05/12/2020   Procedure: UPPER ENDOSCOPIC ULTRASOUND (EUS) RADIAL;  Surgeon: Rachael Fee, MD;  Location: [facility];  Service: Endoscopy;  Laterality: N/A;   KNEE SURGERY Bilateral 1996   x 2 - arthroscopic   LEFT HEART CATH AND CORONARY ANGIOGRAPHY N/A 11/25/2018   Procedure: LEFT HEART CATH AND CORONARY ANGIOGRAPHY;  Surgeon: Lennette Bihari, MD;  Location: [facility];  Service: Cardiovascular;  Laterality: N/A;   PELVIC LAPAROSCOPY  1989   W LYSIS OF ADHESIONS/L SALPINGONEOSTOMY   TUBAL LIGATION     WISDOM TOOTH EXTRACTION     Family History  Problem Relation Age of Onset   Miscarriages / [place] Mother    Early death Mother    Cancer Mother    Uterine  cancer Mother 48   Heart attack Mother    Arthritis Father    Cancer Father    Aneurysm Father    Other Sister        MGUS    Other Sister        MA   COPD Brother    Skin cancer Brother    Skin cancer Brother    Heart disease Paternal Grandfather    Colon cancer Neg Hx    Rectal cancer Neg Hx    Stomach cancer Neg Hx    Stroke Neg Hx    Neuropathy Neg Hx    Colon polyps Neg Hx    Esophageal cancer Neg Hx    Social History   Socioeconomic History   Marital status: Married    Spouse name: don   Number of children: 2   Years of education: College   Highest education level: Not on file  Occupational History   Occupation: Realtor  Tobacco Use   Smoking status: Former    Current packs/day: 0.00    Average packs/day: 0.2 packs/day for 20.0 years (3.0 ttl pk-yrs)    Types: Cigarettes    Start date: 11/12/1978    Quit date: 11/12/1998    Years since quitting: 24.9   Smokeless tobacco: Never  Vaping Use   Vaping status: Never Used  Substance and Sexual Activity   Alcohol use: Not Currently   Drug use: No   Sexual activity: Not Currently    Birth control/protection: Post-menopausal, Surgical    Comment: HYSTERECTOMY  Other Topics Concern   Not on file  Social History Narrative   Lives at  home with her husband Don   Right handed   Caffeine: unsweet tea, 2 glasses daily   Social Determinants of Health   Financial Resource Strain: Low Risk  (10/19/2023)   Overall Financial Resource Strain (CARDIA)    Difficulty of Paying Living Expenses: Not hard at all  Food Insecurity: No Food Insecurity (10/19/2023)   Hunger Vital Sign    Worried About Running Out of Food in the Last Year: Never true    Ran Out of Food in the Last Year: Never true  Transportation Needs: No Transportation Needs (10/19/2023)   PRAPARE - Administrator, Civil Service (Medical): No    Lack of Transportation (Non-Medical): No  Physical Activity: Insufficiently Active (10/19/2023)   Exercise Vital Sign    Days of Exercise per Week: 4 days    Minutes of Exercise per Session: 20 min  Stress: Patient Declined (10/19/2023)   [employer] of Occupational Health - Occupational Stress Questionnaire    Feeling of Stress : Patient declined  Social Connections: Unknown (10/19/2023)   Social Connection and Isolation Panel [NHANES]    Frequency of Communication with Friends and Family: Three times a week    Frequency of Social Gatherings with Friends and Family: Twice a week    Attends Religious Services: Patient declined    Database administrator or Organizations: Patient declined    Attends Engineer, structural: Patient declined    Marital Status: Married    Tobacco Counseling Counseling given: Not Answered   Clinical Intake:  Pre-visit preparation completed: Yes  Pain : No/denies pain     BMI - recorded: 26.09 Nutritional Status: BMI 25 -29 Overweight Nutritional Risks: None Diabetes: No  How often do you need to have someone help you when you read instructions, pamphlets, or other written materials from your doctor or  pharmacy?: 1 - Never  Interpreter Needed?: No  Information entered by :: Lanier Ensign, LPN   Activities of Daily Living    10/19/2023   12:24 PM  In  your present state of health, do you have any difficulty performing the following activities:  Hearing? 0  Vision? 0  Difficulty concentrating or making decisions? 0  Walking or climbing stairs? 0  Dressing or bathing? 0  Doing errands, shopping? 0  Preparing Food and eating ? N  Using the Toilet? N  In the past six months, have you accidently leaked urine? N  Do you have problems with loss of bowel control? N  Managing your Medications? N  Managing your Finances? N  Housekeeping or managing your Housekeeping? N    Patient Care Team: Jeani Sow, MD as PCP - General (Family Medicine) Jodelle Red, MD as PCP - Cardiology (Cardiology) Jeronimo Greaves, CCC-SLP as CIR Admissions Coordinator (Speech Pathology)  Indicate any recent Medical Services you may have received from other than [facility] providers in the past year (date may be approximate).     Assessment:   This is a routine wellness examination for Robin Arellano.  Hearing/Vision screen Hearing Screening - Comments:: Pt denies any hearing issues  Vision Screening - Comments:: Pt follows up with dr Cherlynn Polo    Goals Addressed             This Visit's Progress    Patient Stated       Maintain health and activity        Depression Screen    10/23/2023    3:04 PM 06/25/2023    3:00 PM 10/16/2022    2:25 PM 07/04/2022   11:20 AM  PHQ 2/9 Scores  PHQ - 2 Score 0 1 2 6   PHQ- 9 Score  5 10 22     Fall Risk    10/19/2023   12:24 PM 06/25/2023    2:59 PM 03/25/2023    3:49 PM 10/16/2022    2:21 PM 10/15/2022    9:39 AM  Fall Risk   Falls in the past year? 0 1 1 1  0  Number falls in past yr: 0 0 0 1 1  Injury with Fall? 0 0 1 1 0  Risk for fall due to : No Fall Risks No Fall Risks Impaired balance/gait Impaired vision;Impaired balance/gait   Risk for fall due to: Comment    uses cane   Follow up Falls prevention discussed Falls evaluation completed Falls prevention discussed;Education provided Falls prevention  discussed     MEDICARE RISK AT HOME: Medicare Risk at Home Any stairs in or around the home?: Yes If so, are there any without handrails?: No Home free of loose throw rugs in walkways, pet beds, electrical cords, etc?: Yes Adequate lighting in your home to reduce risk of falls?: Yes Life alert?: No Use of a cane, walker or w/c?: Yes Grab bars in the bathroom?: Yes Shower chair or bench in shower?: Yes Elevated toilet seat or a handicapped toilet?: Yes  TIMED UP AND GO:  Was the test performed?  No    Cognitive Function:declined     10/23/2023    3:12 PM 09/23/2023    3:10 PM  MMSE - Mini Mental State Exam  Not completed: Refused   Orientation to time  5  Orientation to Place  5  Registration  3  Attention/ Calculation  3  Recall  2  Language- name 2 objects  2  Language- repeat  1  Language- follow 3 step command  2  Language- read & follow direction  1  Write a sentence  1  Copy design  1  Total score  26        10/16/2022    2:28 PM  6CIT Screen  What Year? 0 points  What month? 0 points  What time? 0 points  Count back from 20 0 points  Months in reverse 0 points  Repeat phrase 0 points  Total Score 0 points    Immunizations Immunization History  Administered Date(s) Administered   PFIZER(Purple Top)SARS-COV-2 Vaccination 01/18/2020, 02/15/2020    TDAP status: Due, Education has been provided regarding the importance of this vaccine. Advised may receive this vaccine at local pharmacy or Health Dept. Aware to provide a copy of the vaccination record if obtained from local pharmacy or Health Dept. Verbalized acceptance and understanding.  Flu Vaccine status: Declined, Education has been provided regarding the importance of this vaccine but patient still declined. Advised may receive this vaccine at local pharmacy or Health Dept. Aware to provide a copy of the vaccination record if obtained from local pharmacy or Health Dept. Verbalized acceptance and  understanding.  Pneumococcal vaccine status: Declined,  Education has been provided regarding the importance of this vaccine but patient still declined. Advised may receive this vaccine at local pharmacy or Health Dept. Aware to provide a copy of the vaccination record if obtained from local pharmacy or Health Dept. Verbalized acceptance and understanding.   Covid-19 vaccine status: Declined, Education has been provided regarding the importance of this vaccine but patient still declined. Advised may receive this vaccine at local pharmacy or Health Dept.or vaccine clinic. Aware to provide a copy of the vaccination record if obtained from local pharmacy or Health Dept. Verbalized acceptance and understanding.  Qualifies for Shingles Vaccine? Yes   Zostavax completed No   Shingrix Completed?: No.    Education has been provided regarding the importance of this vaccine. Patient has been advised to call insurance company to determine out of pocket expense if they have not yet received this vaccine. Advised may also receive vaccine at local pharmacy or Health Dept. Verbalized acceptance and understanding.  Screening Tests Health Maintenance  Topic Date Due   Pneumonia Vaccine 53+ Years old (1 of 2 - PCV) Never done   COVID-19 Vaccine (3 - Pfizer risk series) 11/04/2023 (Originally 03/14/2020)   DTaP/Tdap/Td (1 - Tdap) 11/07/2023 (Originally 11/29/1969)   Zoster Vaccines- Shingrix (1 of 2) 11/07/2023 (Originally 11/29/1969)   INFLUENZA VACCINE  02/10/2024 (Originally 06/13/2023)   MAMMOGRAM  10/01/2024 (Originally 12/22/2022)   Medicare Annual Wellness (AWV)  10/22/2024   Colonoscopy  03/29/2026   DEXA SCAN  Completed   Hepatitis C Screening  Completed   HPV VACCINES  Aged Out    Health Maintenance  Health Maintenance Due  Topic Date Due   Pneumonia Vaccine 83+ Years old (1 of 2 - PCV) Never done    Colorectal cancer screening: Type of screening: Colonoscopy. Completed 03/29/21. Repeat every 5  years  Mammogram status: Completed 10/02/23. Repeat every year  Bone density 06/06/11  Additional Screening:  Hepatitis C Screening:  Completed 04/04/20  Vision Screening: Recommended annual ophthalmology exams for early detection of glaucoma and other disorders of the eye. Is the patient up to date with their annual eye exam?  Yes  Who is the provider or what is the name of the office in which the patient attends annual eye exams? Dr Cherlynn Polo  If pt is not established with a provider, would they like to be referred to a provider to establish care? No .   Dental Screening: Recommended annual dental exams for proper oral hygiene   Community Resource Referral / Chronic Care Management: CRR required this visit?  No   CCM required this visit?  No     Plan:     I have personally reviewed and noted the following in the patient's chart:   Medical and social history Use of alcohol, tobacco or illicit drugs  Current medications and supplements including opioid prescriptions. Patient is currently taking opioid prescriptions. Information provided to patient regarding non-opioid alternatives. Patient advised to discuss non-opioid treatment plan with their provider. Functional ability and status Nutritional status Physical activity Advanced directives List of other physicians Hospitalizations, surgeries, and ER visits in previous 12 months Vitals Screenings to include cognitive, depression, and falls Referrals and appointments  In addition, I have reviewed and discussed with patient certain preventive protocols, quality metrics, and best practice recommendations. A written personalized care plan for preventive services as well as general preventive health recommendations were provided to patient.     Marzella Schlein, LPN   [id number]/29/5621   After Visit Summary: (MyChart) Due to this being a telephonic visit, the after visit summary with patients personalized plan was offered to patient via  MyChart   Nurse Notes: none

## 2023-11-18 ENCOUNTER — Ambulatory Visit (INDEPENDENT_AMBULATORY_CARE_PROVIDER_SITE_OTHER): Payer: PPO

## 2023-11-18 DIAGNOSIS — I639 Cerebral infarction, unspecified: Secondary | ICD-10-CM | POA: Diagnosis not present

## 2023-11-19 ENCOUNTER — Telehealth: Payer: Self-pay | Admitting: Cardiology

## 2023-11-19 LAB — CUP PACEART REMOTE DEVICE CHECK
Date Time Interrogation Session: 20250105231426
Implantable Pulse Generator Implant Date: 20231117

## 2023-11-19 NOTE — Telephone Encounter (Signed)
*  STAT* If patient is at the pharmacy, call can be transferred to refill team.   1. Which medications need to be refilled? (please list name of each medication and dose if known) amLODipine  (NORVASC ) 10 MG tablet   2. Which pharmacy/location (including street and city if local pharmacy) is medication to be sent to?  [id number] - [place], [place] - [address]    3. Do they need a 30 day or 90 day supply? 90

## 2023-11-20 ENCOUNTER — Other Ambulatory Visit: Payer: Self-pay | Admitting: Family Medicine

## 2023-11-20 DIAGNOSIS — H353211 Exudative age-related macular degeneration, right eye, with active choroidal neovascularization: Secondary | ICD-10-CM | POA: Diagnosis not present

## 2023-11-20 MED ORDER — AMLODIPINE BESYLATE 10 MG PO TABS: 10.0000 mg | ORAL_TABLET | Freq: Every day | ORAL | 2 refills | Status: DC

## 2023-11-20 NOTE — Telephone Encounter (Signed)
 Patient stated she is now completely out of her amLODipine (NORVASC) 10 MG tablet medication and will need a refill sent to [id number] - [place], [place] - [address] [place] [address] [place] [address]

## 2023-11-20 NOTE — Telephone Encounter (Signed)
 RX sent to requested Pharmacy

## 2023-11-21 DIAGNOSIS — H43813 Vitreous degeneration, bilateral: Secondary | ICD-10-CM | POA: Diagnosis not present

## 2023-11-21 DIAGNOSIS — H353123 Nonexudative age-related macular degeneration, left eye, advanced atrophic without subfoveal involvement: Secondary | ICD-10-CM | POA: Diagnosis not present

## 2023-11-21 DIAGNOSIS — H2513 Age-related nuclear cataract, bilateral: Secondary | ICD-10-CM | POA: Diagnosis not present

## 2023-11-21 DIAGNOSIS — H35033 Hypertensive retinopathy, bilateral: Secondary | ICD-10-CM | POA: Diagnosis not present

## 2023-11-21 DIAGNOSIS — H31092 Other chorioretinal scars, left eye: Secondary | ICD-10-CM | POA: Diagnosis not present

## 2023-11-21 DIAGNOSIS — H353211 Exudative age-related macular degeneration, right eye, with active choroidal neovascularization: Secondary | ICD-10-CM | POA: Diagnosis not present

## 2023-11-26 DIAGNOSIS — M25551 Pain in right hip: Secondary | ICD-10-CM | POA: Diagnosis not present

## 2023-11-26 DIAGNOSIS — G5701 Lesion of sciatic nerve, right lower limb: Secondary | ICD-10-CM | POA: Diagnosis not present

## 2023-11-26 DIAGNOSIS — G894 Chronic pain syndrome: Secondary | ICD-10-CM | POA: Diagnosis not present

## 2023-11-26 DIAGNOSIS — M961 Postlaminectomy syndrome, not elsewhere classified: Secondary | ICD-10-CM | POA: Diagnosis not present

## 2023-11-28 ENCOUNTER — Other Ambulatory Visit: Payer: Self-pay | Admitting: Cardiovascular Disease

## 2023-12-02 ENCOUNTER — Other Ambulatory Visit: Payer: Self-pay | Admitting: Family Medicine

## 2023-12-06 ENCOUNTER — Encounter: Payer: Self-pay | Admitting: Family Medicine

## 2023-12-17 ENCOUNTER — Other Ambulatory Visit: Payer: Self-pay | Admitting: Family Medicine

## 2023-12-17 DIAGNOSIS — I714 Abdominal aortic aneurysm, without rupture, unspecified: Secondary | ICD-10-CM | POA: Diagnosis not present

## 2023-12-17 DIAGNOSIS — R700 Elevated erythrocyte sedimentation rate: Secondary | ICD-10-CM | POA: Diagnosis not present

## 2023-12-17 DIAGNOSIS — N1832 Chronic kidney disease, stage 3b: Secondary | ICD-10-CM | POA: Diagnosis not present

## 2023-12-17 DIAGNOSIS — H3530 Unspecified macular degeneration: Secondary | ICD-10-CM | POA: Diagnosis not present

## 2023-12-17 DIAGNOSIS — D509 Iron deficiency anemia, unspecified: Secondary | ICD-10-CM | POA: Diagnosis not present

## 2023-12-17 DIAGNOSIS — I2510 Atherosclerotic heart disease of native coronary artery without angina pectoris: Secondary | ICD-10-CM | POA: Diagnosis not present

## 2023-12-17 DIAGNOSIS — N261 Atrophy of kidney (terminal): Secondary | ICD-10-CM | POA: Diagnosis not present

## 2023-12-17 DIAGNOSIS — I129 Hypertensive chronic kidney disease with stage 1 through stage 4 chronic kidney disease, or unspecified chronic kidney disease: Secondary | ICD-10-CM | POA: Diagnosis not present

## 2023-12-17 DIAGNOSIS — R809 Proteinuria, unspecified: Secondary | ICD-10-CM | POA: Diagnosis not present

## 2023-12-17 NOTE — Telephone Encounter (Signed)
Last OV: 10/02/23  Next OV: 01/08/24  Last Filled: 11/20/2023  Quantity: 15 w/ 1 refill

## 2023-12-18 LAB — LAB REPORT - SCANNED: Creatinine, POC: 76 mg/dL

## 2023-12-23 ENCOUNTER — Ambulatory Visit (INDEPENDENT_AMBULATORY_CARE_PROVIDER_SITE_OTHER): Payer: PPO

## 2023-12-23 DIAGNOSIS — I639 Cerebral infarction, unspecified: Secondary | ICD-10-CM

## 2023-12-23 LAB — CUP PACEART REMOTE DEVICE CHECK
Date Time Interrogation Session: 20250209231715
Implantable Pulse Generator Implant Date: 20231117

## 2023-12-25 DIAGNOSIS — H35033 Hypertensive retinopathy, bilateral: Secondary | ICD-10-CM | POA: Diagnosis not present

## 2023-12-25 DIAGNOSIS — H353123 Nonexudative age-related macular degeneration, left eye, advanced atrophic without subfoveal involvement: Secondary | ICD-10-CM | POA: Diagnosis not present

## 2023-12-25 DIAGNOSIS — H43813 Vitreous degeneration, bilateral: Secondary | ICD-10-CM | POA: Diagnosis not present

## 2023-12-25 DIAGNOSIS — H353211 Exudative age-related macular degeneration, right eye, with active choroidal neovascularization: Secondary | ICD-10-CM | POA: Diagnosis not present

## 2023-12-25 DIAGNOSIS — H2513 Age-related nuclear cataract, bilateral: Secondary | ICD-10-CM | POA: Diagnosis not present

## 2023-12-25 DIAGNOSIS — H31092 Other chorioretinal scars, left eye: Secondary | ICD-10-CM | POA: Diagnosis not present

## 2023-12-30 ENCOUNTER — Other Ambulatory Visit: Payer: Self-pay | Admitting: Family Medicine

## 2023-12-30 ENCOUNTER — Encounter: Payer: Self-pay | Admitting: Internal Medicine

## 2023-12-30 NOTE — Addendum Note (Signed)
Addended by: Geralyn Flash D on: 12/30/2023 03:00 PM   Modules accepted: Orders

## 2023-12-30 NOTE — Progress Notes (Signed)
 [facility] Summary Report / Loop Recorder

## 2024-01-01 ENCOUNTER — Other Ambulatory Visit: Payer: Self-pay | Admitting: Cardiovascular Disease

## 2024-01-01 DIAGNOSIS — I10 Essential (primary) hypertension: Secondary | ICD-10-CM

## 2024-01-08 ENCOUNTER — Encounter: Payer: Self-pay | Admitting: Family Medicine

## 2024-01-08 ENCOUNTER — Ambulatory Visit (INDEPENDENT_AMBULATORY_CARE_PROVIDER_SITE_OTHER): Payer: PPO | Admitting: Family Medicine

## 2024-01-08 VITALS — BP 122/80 | HR 52 | Temp 98.1°F | Resp 16 | Ht 64.0 in | Wt 152.1 lb

## 2024-01-08 DIAGNOSIS — I10 Essential (primary) hypertension: Secondary | ICD-10-CM | POA: Diagnosis not present

## 2024-01-08 DIAGNOSIS — M542 Cervicalgia: Secondary | ICD-10-CM

## 2024-01-08 DIAGNOSIS — F419 Anxiety disorder, unspecified: Secondary | ICD-10-CM | POA: Diagnosis not present

## 2024-01-08 DIAGNOSIS — N1832 Chronic kidney disease, stage 3b: Secondary | ICD-10-CM

## 2024-01-08 DIAGNOSIS — G43009 Migraine without aura, not intractable, without status migrainosus: Secondary | ICD-10-CM | POA: Diagnosis not present

## 2024-01-08 MED ORDER — ONDANSETRON HCL 4 MG PO TABS: 4.0000 mg | ORAL_TABLET | Freq: Three times a day (TID) | ORAL | 3 refills | Status: DC | PRN

## 2024-01-08 MED ORDER — BUTALBITAL-APAP-CAFFEINE 50-325-40 MG PO TABS: 1.0000 | ORAL_TABLET | Freq: Four times a day (QID) | ORAL | 2 refills | Status: DC | PRN

## 2024-01-08 MED ORDER — ALPRAZOLAM 0.5 MG PO TABS: 0.5000 mg | ORAL_TABLET | Freq: Two times a day (BID) | ORAL | 2 refills | Status: DC | PRN

## 2024-01-08 NOTE — Patient Instructions (Signed)
 It was very nice to see you today!    PLEASE NOTE:  If you had any lab tests please let us know if you have not heard back within a few days. You may see your results on MyChart before we have a chance to review them but we will give you a call once they are reviewed by [place]. If we ordered any referrals today, please let us know if you have not heard from their office within the next week.   Please try these tips to maintain a healthy lifestyle:  Eat most of your calories during the day when you are active. Eliminate processed foods including packaged sweets (pies, cakes, cookies), reduce intake of potatoes, white bread, white pasta, and white rice. Look for whole grain options, oat flour or almond flour.  Each meal should contain half fruits/vegetables, one quarter protein, and one quarter carbs (no bigger than a computer mouse).  Cut down on sweet beverages. This includes juice, soda, and sweet tea. Also watch fruit intake, though this is a healthier sweet option, it still contains natural sugar! Limit to 3 servings daily.  Drink at least 1 glass of water with each meal and aim for at least 8 glasses per day  Exercise at least 150 minutes every week.

## 2024-01-08 NOTE — Progress Notes (Signed)
 Subjective:     Patient ID: Robin Arellano, female    DOB: [birth year], 73 y.o.   MRN: [id number]  Chief Complaint  Patient presents with   Medical Management of Chronic Issues    3 month follow-up Getting eye injections with Dr. Meryl Crutch     HPI Discussed the use of AI scribe software for clinical note transcription with the patient, who gave verbal consent to proceed.  History of Present Illness   Robin Arellano is a 73 year old female with macular degeneration who presents with vision issues and headache management.  here w/sister  She has new dx with macular degeneration, significantly affecting her vision. She receives monthly injections in her right eye for wet macular degeneration, which have shown improvement as the bleeding is reducing. The left eye has dry macular degeneration. She experiences significant vision impairment, noting an inability to recognize family members during a Christmas gathering, which prompted her to seek further evaluation. The injections cause temporary visual disturbances, described as seeing 'Christmas lights' and difficulty seeing faces clearly. She is unable to drive due to her vision issues.  She experiences frequent headaches, which she attributes to issues with her neck. She uses Fioricet to manage these headaches, taking up to two pills a day, three days a week, depending on the severity. Her headaches are sometimes exacerbated by dental procedures, particularly a prolonged session for a crown fitting, which left her in significant pain and bedridden for a day and a half. Seeing neuro and per pt "ok w/fioricet"  She is currently taking Xanax, sometimes up to two a day, particularly around the time of her eye injections, to manage stress and anxiety related to the procedure. She is also on hydrocodone but does not take Xanax concurrently with it.  She mentions a history of high blood pressure readings during medical visits, although her  readings are reportedly normal at her kidney specialist's office. She is on medication for blood pressure management.   [facility]-seeing neph and gets labs done there       There are no preventive care reminders to display for this patient.  Past Medical History:  Diagnosis Date   Allergy    Anemia    Anxiety    on meds   Back pain    Blood transfusion without reported diagnosis    Cataract    Chronic female pelvic pain    Chronic kidney disease    Coronary artery disease    mild, non-obstructive 11/2018   Depression    on meds   Family history of adverse reaction to anesthesia    sister had difficulty waking up   Fibromyalgia    H/O leukocytosis    Headache    Heart murmur    Hyperlipidemia    on meds   Hypertension    on meds   Macular degeneration of both eyes    wet R, dry L   MI (myocardial infarction) (HCC)    Pt states she did not have a MI- EKG was normal, was GERD   Osteoarthritis    on meds   Ovarian cyst, right    PONV (postoperative nausea and vomiting)    Post-operative nausea and vomiting    Stroke (HCC) 05/14/2022   L PCA   SVD (spontaneous vaginal delivery)    x 2   Vitamin D deficiency     Past Surgical History:  Procedure Laterality Date   ABDOMINAL HYSTERECTOMY  1994   TAH.BSO  ANTERIOR CERVICAL DECOMP/DISCECTOMY FUSION  2019   APPENDECTOMY  1975   BACK SURGERY  2023   BIOPSY  05/12/2020   Procedure: BIOPSY;  Surgeon: Rachael Fee, MD;  Location: [facility];  Service: Endoscopy;;   CARDIAC CATHETERIZATION  2020   CHOLECYSTECTOMY N/A 07/21/2020   Procedure: LAPAROSCOPIC CHOLECYSTECTOMY WITH INTRAOPERATIVE CHOLANGIOGRAM;  Surgeon: Almond Lint, MD;  Location: [facility];  Service: General;  Laterality: N/A;   COLONOSCOPY  08/12/2017   Hx TA (piecemeal)Jacobs-MAC-suprep (good)   ESOPHAGOGASTRODUODENOSCOPY (EGD) WITH PROPOFOL N/A 05/12/2020   Procedure: ESOPHAGOGASTRODUODENOSCOPY (EGD) WITH PROPOFOL;  Surgeon: Rachael Fee, MD;  Location:  [facility];  Service: Endoscopy;  Laterality: N/A;   EUS N/A 05/12/2020   Procedure: UPPER ENDOSCOPIC ULTRASOUND (EUS) RADIAL;  Surgeon: Rachael Fee, MD;  Location: [facility];  Service: Endoscopy;  Laterality: N/A;   KNEE SURGERY Bilateral 1996   x 2 - arthroscopic   LEFT HEART CATH AND CORONARY ANGIOGRAPHY N/A 11/25/2018   Procedure: LEFT HEART CATH AND CORONARY ANGIOGRAPHY;  Surgeon: Lennette Bihari, MD;  Location: [facility];  Service: Cardiovascular;  Laterality: N/A;   PELVIC LAPAROSCOPY  1989   W LYSIS OF ADHESIONS/L SALPINGONEOSTOMY   TUBAL LIGATION     WISDOM TOOTH EXTRACTION       Current Outpatient Medications:    amLODipine (NORVASC) 10 MG tablet, Take 1 tablet (10 mg total) by mouth daily., Disp: 90 tablet, Rfl: 2   Ascorbic Acid (VITAMIN C) 1000 MG tablet, Take 1,000 mg by mouth in the morning., Disp: , Rfl:    aspirin EC 81 MG tablet, Take 1 tablet (81 mg total) by mouth daily. Swallow whole., Disp: 90 tablet, Rfl: 3   calcium carbonate (OS-CAL) 1250 (500 Ca) MG chewable tablet, 1 tablet with food Orally Twice a day, Disp: , Rfl:    Calcium Carbonate Antacid (TUMS PO), Take 1 tablet by mouth daily as needed (stomach pain)., Disp: , Rfl:    Cholecalciferol (VITAMIN D) 50 MCG (2000 UT) tablet, Take 2,000 Units by mouth in the morning., Disp: , Rfl:    cloNIDine (CATAPRES) 0.1 MG tablet, TAKE 1 TABLET(0.1 MG) BY MOUTH TWICE DAILY, Disp: 90 tablet, Rfl: 3   ezetimibe (ZETIA) 10 MG tablet, TAKE 1 TABLET(10 MG) BY MOUTH DAILY, Disp: 90 tablet, Rfl: 2   hydrALAZINE (APRESOLINE) 100 MG tablet, TAKE 1 TABLET(100 MG) BY MOUTH THREE TIMES DAILY, Disp: 270 tablet, Rfl: 2   HYDROcodone-acetaminophen (NORCO) 10-325 MG tablet, Take 1 tablet by mouth every 6 (six) hours as needed., Disp: , Rfl:    methocarbamol (ROBAXIN) 500 MG tablet, Take 1 tablet (500 mg total) by mouth every 6 (six) hours as needed for muscle spasms., Disp: 30 tablet, Rfl: 3   pantoprazole (PROTONIX) 40  MG tablet, TAKE 1 TABLET(40 MG) BY MOUTH DAILY, Disp: 90 tablet, Rfl: 3   Potassium Chloride ER 20 MEQ TBCR, TAKE 1 TABLET BY MOUTH IN THE MORNING AND AT BEDTIME, Disp: 180 tablet, Rfl: 0   pravastatin (PRAVACHOL) 40 MG tablet, Take 1 tablet (40 mg total) by mouth every evening., Disp: 90 tablet, Rfl: 3   spironolactone (ALDACTONE) 25 MG tablet, Take 25 mg by mouth daily., Disp: , Rfl:    valACYclovir (VALTREX) 500 MG tablet, TAKE 1 TABLET BY MOUTH TWICE DAILY FOR 3 TO 5 DAYS THEN TAKE DAILY AS NEEDED, Disp: 30 tablet, Rfl: 0   ALPRAZolam (XANAX) 0.5 MG tablet, Take 1 tablet (0.5 mg total) by mouth 2 (two) times  daily as needed for anxiety. TAKE 1 TABLET(0.5 MG) BY MOUTH TWICE DAILY AS NEEDED FOR SLEEP OR ANXIETY, Disp: 35 tablet, Rfl: 2   butalbital-acetaminophen-caffeine (FIORICET) 50-325-40 MG tablet, Take 1 tablet by mouth every 6 (six) hours as needed for headache., Disp: 20 tablet, Rfl: 2   ondansetron (ZOFRAN) 4 MG tablet, Take 1 tablet (4 mg total) by mouth every 8 (eight) hours as needed for nausea or vomiting., Disp: 20 tablet, Rfl: 3  Allergies  Allergen Reactions   Lidocaine Hives   Zanaflex [Tizanidine] Anaphylaxis, Hives, Itching and Swelling   Neurontin [Gabapentin] Itching and Swelling   Latex Rash   ROS neg/noncontributory except as noted HPI/below      Objective:     BP 122/80   Pulse (!) 52   Temp 98.1 F (36.7 C) (Temporal)   Resp 16   Ht 5\' 4"  (1.626 m)   Wt 152 lb 2 oz (69 kg)   LMP  (LMP Unknown)   SpO2 96%   BMI 26.11 kg/m  Wt Readings from Last 3 Encounters:  01/08/24 152 lb 2 oz (69 kg)  10/23/23 152 lb (68.9 kg)  10/07/23 152 lb 3.2 oz (69 kg)    Physical Exam   Gen: WDWN NAD HEENT: NCAT, conjunctiva not injected, sclera nonicteric NECK:  supple, no thyromegaly, no nodes, no carotid bruits CARDIAC: RRR, S1S2+, 2/6 murmur.  LUNGS: CTAB. No wheezes ABDOMEN:  BS+, soft, NTND, No HSM, no masses EXT:  no edema MSK: no gross abnormalities.  cane NEURO: A&O x3.  CN II-XII intact.  PSYCH: normal mood. Good eye contact  Gets labs at neph     Assessment & Plan:  Chronic kidney disease, stage 3b (HCC)  Essential hypertension  Migraine without aura and without status migrainosus, not intractable  Neck pain  Anxiety  Other orders -     Ondansetron HCl; Take 1 tablet (4 mg total) by mouth every 8 (eight) hours as needed for nausea or vomiting.  Dispense: 20 tablet; Refill: 3 -     ALPRAZolam; Take 1 tablet (0.5 mg total) by mouth 2 (two) times daily as needed for anxiety. TAKE 1 TABLET(0.5 MG) BY MOUTH TWICE DAILY AS NEEDED FOR SLEEP OR ANXIETY  Dispense: 35 tablet; Refill: 2 -     Butalbital-APAP-Caffeine; Take 1 tablet by mouth every 6 (six) hours as needed for headache.  Dispense: 20 tablet; Refill: 2    Assessment and Plan    Age-related Macular Degeneration AMD presents with wet AMD in the right eye and dry AMD in the left eye. Monthly injections from Dr. Meryl Crutch for wet AMD show improvement with reduced bleeding, though significant visual impairment affects quality of life. The high cost and stress of Cymboria injections, not covered by insurance, were discussed. Informed consent includes risks of bleeding, benefits of reduced bleeding and improved vision, and the need for ongoing treatment. Continue monthly injections for wet AMD in the right eye with Dr. Meryl Crutch and monitor visual acuity and symptoms regularly.  Chronic Headaches Chronic headaches primarily originate from the neck, with exacerbation from dental procedures. Uses Fioricet, up to two pills a day for three days a week. Discussed managing neck support during dental visits to prevent exacerbation. Continue Fioricet as needed, not exceeding two pills a day. Advise bringing a towel or pillow to dental appointments for neck support and request breaks during prolonged procedures.  discussed overuse of meds and risks  Anxiety Xanax (alprazolam) is taken for  anxiety, particularly before eye  injections, sometimes up to two pills a day. Caution is advised due to concurrent use of hydrocodone, with discussion on potential dependency and the need to avoid exceeding the prescribed dose. Continue Xanax as needed, with caution to avoid exceeding the prescribed dose. Monitor for signs of increased usage and potential dependency.  Hypertension Consistently high blood pressure readings during visits, though normal when checked by the nephrologist. On medication for hypertension, with emphasis on the importance of regular monitoring and adherence to medication. Recheck blood pressure during visits and continue current hypertension medication.  General Health Maintenance Medication refills are required, with no recent refill for Zofran. Discussed the importance of medication adherence and regular follow-ups. Refill prescriptions for Xanax, Fioricet, and Zofran.        Return in about 6 months (around 07/07/2024) for chronic follow-up.  Angelena Sole, MD

## 2024-01-13 ENCOUNTER — Other Ambulatory Visit (HOSPITAL_COMMUNITY): Payer: Self-pay

## 2024-01-13 ENCOUNTER — Telehealth: Payer: Self-pay

## 2024-01-13 NOTE — Telephone Encounter (Signed)
 Pharmacy Patient Advocate Encounter   Received notification from Onbase that prior authorization for Ondansetron HCl 4MG  tablets is required/requested.   Insurance verification completed.   The patient is insured through [facility] ADVANTAGE/RX ADVANCE .   Per test claim: PA required; PA submitted to above mentioned insurance via CoverMyMeds Key/confirmation #/EOC [id number] Status is pending

## 2024-01-16 ENCOUNTER — Other Ambulatory Visit: Payer: Self-pay | Admitting: Family Medicine

## 2024-01-16 NOTE — Telephone Encounter (Signed)
 Pharmacy Patient Advocate Encounter  Received notification from [facility] ADVANTAGE/RX ADVANCE that Prior Authorization for Ondansetron HCl 4MG  tablets  has been DENIED.  Full denial letter will be uploaded to the media tab. See denial reason below.

## 2024-01-21 ENCOUNTER — Other Ambulatory Visit: Payer: Self-pay | Admitting: Family Medicine

## 2024-01-21 DIAGNOSIS — M25551 Pain in right hip: Secondary | ICD-10-CM | POA: Diagnosis not present

## 2024-01-21 DIAGNOSIS — G894 Chronic pain syndrome: Secondary | ICD-10-CM | POA: Diagnosis not present

## 2024-01-21 DIAGNOSIS — G5701 Lesion of sciatic nerve, right lower limb: Secondary | ICD-10-CM | POA: Diagnosis not present

## 2024-01-21 DIAGNOSIS — M961 Postlaminectomy syndrome, not elsewhere classified: Secondary | ICD-10-CM | POA: Diagnosis not present

## 2024-01-21 DIAGNOSIS — Z79891 Long term (current) use of opiate analgesic: Secondary | ICD-10-CM | POA: Diagnosis not present

## 2024-01-27 ENCOUNTER — Ambulatory Visit (INDEPENDENT_AMBULATORY_CARE_PROVIDER_SITE_OTHER): Payer: PPO

## 2024-01-27 DIAGNOSIS — I639 Cerebral infarction, unspecified: Secondary | ICD-10-CM

## 2024-01-28 LAB — CUP PACEART REMOTE DEVICE CHECK
Date Time Interrogation Session: 20250316231605
Implantable Pulse Generator Implant Date: 20231117

## 2024-01-29 ENCOUNTER — Encounter: Payer: Self-pay | Admitting: Internal Medicine

## 2024-01-31 DIAGNOSIS — H43813 Vitreous degeneration, bilateral: Secondary | ICD-10-CM | POA: Diagnosis not present

## 2024-01-31 DIAGNOSIS — H35033 Hypertensive retinopathy, bilateral: Secondary | ICD-10-CM | POA: Diagnosis not present

## 2024-01-31 DIAGNOSIS — H2513 Age-related nuclear cataract, bilateral: Secondary | ICD-10-CM | POA: Diagnosis not present

## 2024-01-31 DIAGNOSIS — H353123 Nonexudative age-related macular degeneration, left eye, advanced atrophic without subfoveal involvement: Secondary | ICD-10-CM | POA: Diagnosis not present

## 2024-01-31 DIAGNOSIS — H353211 Exudative age-related macular degeneration, right eye, with active choroidal neovascularization: Secondary | ICD-10-CM | POA: Diagnosis not present

## 2024-01-31 DIAGNOSIS — H31092 Other chorioretinal scars, left eye: Secondary | ICD-10-CM | POA: Diagnosis not present

## 2024-02-03 NOTE — Progress Notes (Signed)
 [facility] Summary Report / Loop Recorder

## 2024-02-26 ENCOUNTER — Other Ambulatory Visit: Payer: Self-pay | Admitting: Family Medicine

## 2024-02-26 NOTE — Telephone Encounter (Signed)
 01/08/2024 LOV  01/08/2024 fill date  0 fills

## 2024-03-02 ENCOUNTER — Ambulatory Visit (INDEPENDENT_AMBULATORY_CARE_PROVIDER_SITE_OTHER): Payer: PPO

## 2024-03-02 DIAGNOSIS — I639 Cerebral infarction, unspecified: Secondary | ICD-10-CM

## 2024-03-02 LAB — CUP PACEART REMOTE DEVICE CHECK
Date Time Interrogation Session: 20250420231628
Implantable Pulse Generator Implant Date: 20231117

## 2024-03-03 ENCOUNTER — Encounter: Payer: Self-pay | Admitting: Internal Medicine

## 2024-03-05 DIAGNOSIS — H353211 Exudative age-related macular degeneration, right eye, with active choroidal neovascularization: Secondary | ICD-10-CM | POA: Diagnosis not present

## 2024-03-12 DIAGNOSIS — I129 Hypertensive chronic kidney disease with stage 1 through stage 4 chronic kidney disease, or unspecified chronic kidney disease: Secondary | ICD-10-CM | POA: Diagnosis not present

## 2024-03-12 DIAGNOSIS — D509 Iron deficiency anemia, unspecified: Secondary | ICD-10-CM | POA: Diagnosis not present

## 2024-03-12 DIAGNOSIS — H3530 Unspecified macular degeneration: Secondary | ICD-10-CM | POA: Diagnosis not present

## 2024-03-12 DIAGNOSIS — R700 Elevated erythrocyte sedimentation rate: Secondary | ICD-10-CM | POA: Diagnosis not present

## 2024-03-12 DIAGNOSIS — N1832 Chronic kidney disease, stage 3b: Secondary | ICD-10-CM | POA: Diagnosis not present

## 2024-03-12 DIAGNOSIS — I714 Abdominal aortic aneurysm, without rupture, unspecified: Secondary | ICD-10-CM | POA: Diagnosis not present

## 2024-03-12 DIAGNOSIS — N261 Atrophy of kidney (terminal): Secondary | ICD-10-CM | POA: Diagnosis not present

## 2024-03-12 DIAGNOSIS — I2510 Atherosclerotic heart disease of native coronary artery without angina pectoris: Secondary | ICD-10-CM | POA: Diagnosis not present

## 2024-03-12 DIAGNOSIS — R809 Proteinuria, unspecified: Secondary | ICD-10-CM | POA: Diagnosis not present

## 2024-03-13 LAB — LAB REPORT - SCANNED
Creatinine, POC: 53.7 mg/dL
EGFR: 35

## 2024-03-18 ENCOUNTER — Other Ambulatory Visit: Payer: Self-pay | Admitting: Family Medicine

## 2024-03-18 DIAGNOSIS — M25551 Pain in right hip: Secondary | ICD-10-CM | POA: Diagnosis not present

## 2024-03-18 DIAGNOSIS — M961 Postlaminectomy syndrome, not elsewhere classified: Secondary | ICD-10-CM | POA: Diagnosis not present

## 2024-03-18 DIAGNOSIS — G5701 Lesion of sciatic nerve, right lower limb: Secondary | ICD-10-CM | POA: Diagnosis not present

## 2024-03-18 DIAGNOSIS — G894 Chronic pain syndrome: Secondary | ICD-10-CM | POA: Diagnosis not present

## 2024-03-18 NOTE — Progress Notes (Signed)
 [facility] Summary Report / Loop Recorder

## 2024-03-18 NOTE — Addendum Note (Signed)
 Addended by: Edra Govern D on: 03/18/2024 12:59 PM   Modules accepted: Orders

## 2024-03-23 ENCOUNTER — Ambulatory Visit: Payer: Self-pay | Admitting: *Deleted

## 2024-03-23 ENCOUNTER — Telehealth: Payer: Self-pay | Admitting: Family Medicine

## 2024-03-23 NOTE — Telephone Encounter (Signed)
 Pt advised Home Care  Patient Name First: Robin Last: Arellano Gender: Female DOB: May 31, 1951 Age: 72 Y 3 M 22 D Return Phone Number: [phone] (Primary) Address: City/ State/ Zip: [place]  [address] Client [facility] - Human resources [facility] [place] Provider [address] Contact Type Call Who Is Calling Patient / Member / Family / Caregiver Call Type Triage / Clinical Relationship To Patient Self Return Phone Number [phone] (Primary) Chief Complaint CHEST PAIN - pain, pressure, heaviness or tightness Reason for Call Symptomatic / Request for Health Information Initial Comment Caller started with a sore throat which has now turned into a bad cough. She does have high BP and her husband got her a cough medication and she is wanting to know if it is ok to take. She was having some chest pain as well but it is not as bad. Translation No Nurse Assessment Nurse: Claryce Cruel, RN, Ally Date/Time (Eastern Time): 03/21/2024 1:34:03 PM Confirm and document reason for call. If symptomatic, describe symptoms. ---Caller states that she has a bad cough that is driving her cough. She is not having any phlegm. Her cough has been since thursday. She is also experiencing a bit of a sore throat. No fever or any other symptoms. Does the patient have any new or worsening symptoms? ---Yes Will a triage be completed? ---Yes Related visit to physician within the last 2 weeks? ---No Does the PT have any chronic conditions? (i.e. diabetes, asthma, this includes High risk factors for pregnancy, etc.) ---No Is this a behavioral health or substance abuse call? ---No Guidelines Guideline Title Affirmed Question Affirmed Notes Nurse Date/Time [facility] Time) Cough - Acute NonProductive Cough with cold symptoms (e.g., runny nose, postnasal drip, throat clearing) Claryce Cruel, RN, Ally 03/21/2024 1:36:06 PM Disp. Time  [facility] Time) Disposition Final User 03/21/2024 1:32:15 PM Send to Urgent Elenor Griffith 03/21/2024 1:42:23 PM Home Care Yes Claryce Cruel RN, Satira Curet Final Disposition 03/21/2024 1:42:23 PM Home Care Yes Claryce Cruel, RN, Dickie Found Disagree/Comply Comply Caller Understands Yes PreDisposition Go to Urgent Care/Walk-In Clinic Care Advice Given Per Guideline * You should be able to treat this at home. HOME CARE: * It sounds like an uncomplicated cold that we can treat at home. * Colds are very common and may make you feel uncomfortable. * Colds are usually not serious. * Here is some care advice that should help. * For fevers above 101 F (38.3 C) take either acetaminophen  or ibuprofen . * You become worse * Fever over 104 F (40 C), or fever lasts over 3 days * Nasal discharge lasts over 10 days CALL BACK IF: CARE ADVICE given per Cough - Acute NonProductive (Adult) guideline

## 2024-03-23 NOTE — Telephone Encounter (Signed)
 Would you like me to have her added to your schedule?

## 2024-03-23 NOTE — Telephone Encounter (Signed)
 Please see msg and advise if pt needing to be seen sooner

## 2024-03-23 NOTE — Telephone Encounter (Signed)
 Pt keeping scheduled appt for tomorrow

## 2024-03-23 NOTE — Telephone Encounter (Signed)
 Chief Complaint: patient daughter with patient  reports worsening cough, V/D and fever 99.9 this am  Symptoms: worsening cough since Friday. Taking coricidin OTC cough med helps some. Now vomiting with coughing spells. Chest pain with coughing . Denies SOB. Diarrhea x 2 today loose and watery. Dizziness at times. Reports she is drinking fluids. Fever 99.9 this am . Ist day of having fever Frequency: Friday  Pertinent Negatives: Patient denies chest pain with difficulty breathing.  Disposition: [] ED /[] Urgent Care (no appt availability in office) / [x] Appointment(In office/virtual)/ []  [facility]/ [] Home Care/ [] Refused Recommended Disposition /[] [facility]/ []  Follow-up with PCP Additional Notes:   Offered appt today with other provider and patient only wants to see PCP. Scheduled appt with PCP 03/24/24. Please advise if PCP wants patient to be seen sooner.  Recommended if sx worsen may need to be seen today and if after office hours go to ED.      Copied from CRM [id number]. Topic: Clinical - Red Word Triage >> Mar 23, 2024  9:21 AM Oddis Bench wrote: Red Word that prompted transfer to Nurse Triage: patinet has vomiting, diarrhea and bad cough. Reason for Disposition  SEVERE coughing spells (e.g., whooping sound after coughing, vomiting after coughing)  Answer Assessment - Initial Assessment Questions 1. ONSET: "When did the cough begin?"      Friday  2. SEVERITY: "How bad is the cough today?"      Worsening  3. SPUTUM: "Describe the color of your sputum" (none, dry cough; clear, white, yellow, green)     None , sounds like "rattling" in chest  4. HEMOPTYSIS: "Are you coughing up any blood?" If so ask: "How much?" (flecks, streaks, tablespoons, etc.)     Na  5. DIFFICULTY BREATHING: "Are you having difficulty breathing?" If Yes, ask: "How bad is it?" (e.g., mild, moderate, severe)    - MILD: No SOB at rest, mild SOB with walking, speaks normally in sentences, can  lie down, no retractions, pulse < 100.    - MODERATE: SOB at rest, SOB with minimal exertion and prefers to sit, cannot lie down flat, speaks in phrases, mild retractions, audible wheezing, pulse 100-120.    - SEVERE: Very SOB at rest, speaks in single words, struggling to breathe, sitting hunched forward, retractions, pulse > 120      Not sleeping well  denies SOB chest pain with coughing 6. FEVER: "Do you have a fever?" If Yes, ask: "What is your temperature, how was it measured, and when did it start?"     99.9 7. CARDIAC HISTORY: "Do you have any history of heart disease?" (e.g., heart attack, congestive heart failure)      na 8. LUNG HISTORY: "Do you have any history of lung disease?"  (e.g., pulmonary embolus, asthma, emphysema)     na 9. PE RISK FACTORS: "Do you have a history of blood clots?" (or: recent major surgery, recent prolonged travel, bedridden)     na 10. OTHER SYMPTOMS: "Do you have any other symptoms?" (e.g., runny nose, wheezing, chest pain)       Cough not able to cough up phlegm. Vomiting at times coughing. Diarrhea x 2 today loose and watery .  11. PREGNANCY: "Is there any chance you are pregnant?" "When was your last menstrual period?"       na 12. TRAVEL: "Have you traveled out of the country in the last month?" (e.g., travel history, exposures)       na  Protocols  used: Cough - Acute Productive-A-AH

## 2024-03-24 ENCOUNTER — Ambulatory Visit (INDEPENDENT_AMBULATORY_CARE_PROVIDER_SITE_OTHER): Admitting: Family Medicine

## 2024-03-24 ENCOUNTER — Encounter: Payer: Self-pay | Admitting: Family Medicine

## 2024-03-24 VITALS — BP 180/80 | HR 67 | Temp 98.6°F | Resp 18 | Ht 64.0 in | Wt 150.4 lb

## 2024-03-24 DIAGNOSIS — J40 Bronchitis, not specified as acute or chronic: Secondary | ICD-10-CM | POA: Diagnosis not present

## 2024-03-24 MED ORDER — AMOXICILLIN-POT CLAVULANATE 875-125 MG PO TABS: 1.0000 | ORAL_TABLET | Freq: Two times a day (BID) | ORAL | 0 refills | Status: DC

## 2024-03-24 MED ORDER — AZITHROMYCIN 250 MG PO TABS: ORAL_TABLET | ORAL | 0 refills | Status: AC

## 2024-03-24 MED ORDER — PREDNISONE 20 MG PO TABS: 40.0000 mg | ORAL_TABLET | Freq: Every day | ORAL | 0 refills | Status: AC

## 2024-03-24 MED ORDER — ALBUTEROL SULFATE HFA 108 (90 BASE) MCG/ACT IN AERS: 2.0000 | INHALATION_SPRAY | Freq: Four times a day (QID) | RESPIRATORY_TRACT | 0 refills | Status: DC | PRN

## 2024-03-24 NOTE — Patient Instructions (Signed)
 Delsym   Inhaler Prednisone  Antibiotics  Worse, ER.

## 2024-03-24 NOTE — Progress Notes (Signed)
 Subjective:     Patient ID: Robin Syliva Arellano, female    DOB: 1951-09-22, 73 y.o.   MRN: [id number]  Chief Complaint  Patient presents with   Cough    Non productive cough that started Thursday, taking Coricedin HPB   Sore Throat    Negative home tests on yesterday    HPI Discussed the use of AI scribe software for clinical note transcription with the patient, who gave verbal consent to proceed.  History of Present Illness Robin Arellano is a 73 year old female with heart failure who presents with respiratory symptoms and possible pneumonia. She is accompanied by her sister.  She began feeling unwell on Thursday, May 8th, with symptoms including congestion, rhinorrhea, coughing, and a sore throat. No fever initially, but her sister noted a temperature of 99.5F yesterday. She continues to experience dyspnea and a persistent cough, which worsens when she tries to sleep, as it triggers coughing fits. She has not used an inhaler recently and does not have one at home.  She experienced vomiting and diarrhea, but these symptoms have since resolved. She has tested negative for COVID-19 and other viral infections as of yesterday. She mentions difficulty sleeping due to her cough and dyspnea, and she feels that her symptoms are not improving.  Her blood pressure is noted to be elevated, although she mentions it has been better at home. She is currently taking pain medication, which she feels helps with her cough. She has a history of heart failure, which is relevant to her current respiratory symptoms.  She reports having cerumen impaction in her ears, particularly in the right ear, and has not used any treatments to address it.  Her husband is not sick, but her daughter Trevor Fudge, who is also unwell, might have contracted the illness from her. She reports experiencing chest pain.    There are no preventive care reminders to display for this patient.  Past Medical History:   Diagnosis Date   Allergy    Anemia    Anxiety    on meds   Back pain    Blood transfusion without reported diagnosis    Cataract    Chronic female pelvic pain    Chronic kidney disease    Coronary artery disease    mild, non-obstructive 11/2018   Depression    on meds   Family history of adverse reaction to anesthesia    sister had difficulty waking up   Fibromyalgia    H/O leukocytosis    Headache    Heart murmur    Hyperlipidemia    on meds   Hypertension    on meds   Macular degeneration of both eyes    wet R, dry L   MI (myocardial infarction) (HCC)    Pt states she did not have a MI- EKG was normal, was GERD   Osteoarthritis    on meds   Ovarian cyst, right    PONV (postoperative nausea and vomiting)    Post-operative nausea and vomiting    Stroke (HCC) 05/14/2022   L PCA   SVD (spontaneous vaginal delivery)    x 2   Vitamin D  deficiency     Past Surgical History:  Procedure Laterality Date   ABDOMINAL HYSTERECTOMY  1994   TAH.BSO   ANTERIOR CERVICAL DECOMP/DISCECTOMY FUSION  2019   APPENDECTOMY  1975   BACK SURGERY  2023   BIOPSY  05/12/2020   Procedure: BIOPSY;  Surgeon: Janel Medford, MD;  Location: [facility];  Service: Endoscopy;;   CARDIAC CATHETERIZATION  2020   CHOLECYSTECTOMY N/A 07/21/2020   Procedure: LAPAROSCOPIC CHOLECYSTECTOMY WITH INTRAOPERATIVE CHOLANGIOGRAM;  Surgeon: Lockie Rima, MD;  Location: [facility];  Service: General;  Laterality: N/A;   COLONOSCOPY  08/12/2017   Hx TA (piecemeal)Jacobs-MAC-suprep (good)   ESOPHAGOGASTRODUODENOSCOPY (EGD) WITH PROPOFOL  N/A 05/12/2020   Procedure: ESOPHAGOGASTRODUODENOSCOPY (EGD) WITH PROPOFOL ;  Surgeon: Janel Medford, MD;  Location: [facility];  Service: Endoscopy;  Laterality: N/A;   EUS N/A 05/12/2020   Procedure: UPPER ENDOSCOPIC ULTRASOUND (EUS) RADIAL;  Surgeon: Janel Medford, MD;  Location: [facility];  Service: Endoscopy;  Laterality: N/A;   KNEE SURGERY Bilateral 1996   x 2  - arthroscopic   LEFT HEART CATH AND CORONARY ANGIOGRAPHY N/A 11/25/2018   Procedure: LEFT HEART CATH AND CORONARY ANGIOGRAPHY;  Surgeon: Millicent Ally, MD;  Location: [facility];  Service: Cardiovascular;  Laterality: N/A;   PELVIC LAPAROSCOPY  1989   W LYSIS OF ADHESIONS/L SALPINGONEOSTOMY   TUBAL LIGATION     WISDOM TOOTH EXTRACTION       Current Outpatient Medications:    albuterol  (VENTOLIN  HFA) 108 (90 Base) MCG/ACT inhaler, Inhale 2 puffs into the lungs every 6 (six) hours as needed for wheezing or shortness of breath., Disp: 8 g, Rfl: 0   ALPRAZolam  (XANAX ) 0.5 MG tablet, Take 1 tablet (0.5 mg total) by mouth 2 (two) times daily as needed for anxiety. TAKE 1 TABLET(0.5 MG) BY MOUTH TWICE DAILY AS NEEDED FOR SLEEP OR ANXIETY, Disp: 35 tablet, Rfl: 2   amLODipine  (NORVASC ) 10 MG tablet, Take 1 tablet (10 mg total) by mouth daily., Disp: 90 tablet, Rfl: 2   amoxicillin-clavulanate (AUGMENTIN) 875-125 MG tablet, Take 1 tablet by mouth 2 (two) times daily., Disp: 20 tablet, Rfl: 0   Ascorbic Acid  (VITAMIN C) 1000 MG tablet, Take 1,000 mg by mouth in the morning., Disp: , Rfl:    aspirin  EC 81 MG tablet, Take 1 tablet (81 mg total) by mouth daily. Swallow whole., Disp: 90 tablet, Rfl: 3   azithromycin (ZITHROMAX) 250 MG tablet, Take 2 tablets on day 1, then 1 tablet daily on days 2 through 5, Disp: 6 tablet, Rfl: 0   butalbital -acetaminophen -caffeine  (FIORICET ) 50-325-40 MG tablet, TAKE 1 TABLET BY MOUTH EVERY 6 HOURS AS NEEDED FOR HEADACHE, Disp: 20 tablet, Rfl: 1   calcium  carbonate (OS-CAL) 1250 (500 Ca) MG chewable tablet, 1 tablet with food Orally Twice a day, Disp: , Rfl:    Calcium  Carbonate Antacid (TUMS PO), Take 1 tablet by mouth daily as needed (stomach pain)., Disp: , Rfl:    Cholecalciferol  (VITAMIN D ) 50 MCG (2000 UT) tablet, Take 2,000 Units by mouth in the morning., Disp: , Rfl:    cloNIDine  (CATAPRES ) 0.1 MG tablet, TAKE 1 TABLET(0.1 MG) BY MOUTH TWICE DAILY, Disp:  90 tablet, Rfl: 3   ezetimibe  (ZETIA ) 10 MG tablet, TAKE 1 TABLET(10 MG) BY MOUTH DAILY, Disp: 90 tablet, Rfl: 2   hydrALAZINE  (APRESOLINE ) 100 MG tablet, TAKE 1 TABLET(100 MG) BY MOUTH THREE TIMES DAILY, Disp: 270 tablet, Rfl: 2   HYDROcodone -acetaminophen  (NORCO) 10-325 MG tablet, Take 1 tablet by mouth every 6 (six) hours as needed., Disp: , Rfl:    methocarbamol  (ROBAXIN ) 500 MG tablet, Take 1 tablet (500 mg total) by mouth every 6 (six) hours as needed for muscle spasms., Disp: 30 tablet, Rfl: 3   ondansetron  (ZOFRAN ) 4 MG tablet, Take 1 tablet (4 mg total) by mouth  every 8 (eight) hours as needed for nausea or vomiting., Disp: 20 tablet, Rfl: 3   pantoprazole  (PROTONIX ) 40 MG tablet, TAKE 1 TABLET(40 MG) BY MOUTH DAILY, Disp: 90 tablet, Rfl: 3   Potassium Chloride  ER 20 MEQ TBCR, TAKE 1 TABLET BY MOUTH IN THE MORNING AND AT BEDTIME, Disp: 180 tablet, Rfl: 0   pravastatin  (PRAVACHOL ) 40 MG tablet, Take 1 tablet (40 mg total) by mouth every evening., Disp: 90 tablet, Rfl: 3   predniSONE  (DELTASONE ) 20 MG tablet, Take 2 tablets (40 mg total) by mouth daily with breakfast for 5 days., Disp: 10 tablet, Rfl: 0   spironolactone  (ALDACTONE ) 25 MG tablet, Take 25 mg by mouth daily., Disp: , Rfl:    valACYclovir  (VALTREX ) 500 MG tablet, TAKE 1 TABLET BY MOUTH TWICE DAILY FOR 3 TO 5 DAYS THEN TAKE DAILY AS NEEDED, Disp: 30 tablet, Rfl: 0  Allergies  Allergen Reactions   Lidocaine  Hives   Zanaflex [Tizanidine] Anaphylaxis, Hives, Itching and Swelling   Neurontin [Gabapentin] Itching and Swelling   Latex Rash   ROS neg/noncontributory except as noted HPI/below      Objective:      BP (!) 180/80 (BP Location: Left Arm, Patient Position: Sitting, Cuff Size: Normal)   Pulse 67   Temp 98.6 F (37 C) (Temporal)   Resp 18   Ht 5\' 4"  (1.626 m)   Wt 150 lb 6 oz (68.2 kg)   LMP  (LMP Unknown)   SpO2 94%   BMI 25.81 kg/m  Wt Readings from Last 3 Encounters:  03/24/24 150 lb 6 oz (68.2 kg)   01/08/24 152 lb 2 oz (69 kg)  10/23/23 152 lb (68.9 kg)    Physical Exam   Gen: WDWN NAD HEENT: NCAT, conjunctiva not injected, sclera nonicteric TM WNL L,  wax R, OP moist, no exudates  NECK:  supple, no thyromegaly, no nodes, no carotid bruits CARDIAC: RRR, S1S2+, +2/6 murmur. LUNGS: congestion L.  + exp wheezes B EXT:  no edema MSK: no gross abnormalities.  NEURO: A&O x3.  CN II-XII intact.  PSYCH: normal mood. Good eye contact     Assessment & Plan:  Bronchitis  Other orders -     Albuterol  Sulfate HFA; Inhale 2 puffs into the lungs every 6 (six) hours as needed for wheezing or shortness of breath.  Dispense: 8 g; Refill: 0 -     Azithromycin; Take 2 tablets on day 1, then 1 tablet daily on days 2 through 5  Dispense: 6 tablet; Refill: 0 -     Amoxicillin-Pot Clavulanate; Take 1 tablet by mouth 2 (two) times daily.  Dispense: 20 tablet; Refill: 0 -     predniSONE ; Take 2 tablets (40 mg total) by mouth daily with breakfast for 5 days.  Dispense: 10 tablet; Refill: 0  Assessment and Plan Assessment & Plan Pneumonia   Suspected pneumonia presents with bilateral wheezing, more pronounced on the left, along with cough, shortness of breath, and chest pain. COVID-19 and influenza tests are negative. A chest x-ray is not performed-pt declined. Due to symptom severity, antibiotics and steroids are necessary. Potential side effects, including gastrointestinal upset and insomnia, were discussed. Start Z-Pak (azithromycin) and Augmentin (amoxicillin/clavulanate). Prescribe prednisone  for inflammation. Advise using an inhaler for wheezing and recommend Delsym  for cough management. Instruct to visit the ER if symptoms worsen or do not improve.  Shortness of breath   Shortness of breath is linked to suspected pneumonia. An inhaler is needed for symptom management,  as none is currently available at home. Prescribe an inhaler, noting it may cause shakiness and jitteriness.  Cough    Persistent cough associated with suspected pneumonia is interfering with sleep. Current pain medication may help with cough. Recommend Delsym  for cough management but advise against complete suppression to allow expectoration.  Hypertension   Blood pressure is elevated at 180/80 mmHg but better controlled at home. Hypertension management is complicated by the current illness. Recommend Coricidin for cold symptoms due to its safety profile in hypertension. Monitor blood pressure closely.  Earwax impaction   Right ear is impacted with wax. She has avoided peroxide due to a previous negative experience. Recommend using Colace or Murine wax removal drops as alternative treatments.    Return if symptoms worsen or fail to improve.  Ellsworth Haas, MD

## 2024-03-31 ENCOUNTER — Other Ambulatory Visit: Payer: Self-pay | Admitting: Family Medicine

## 2024-04-01 ENCOUNTER — Other Ambulatory Visit: Payer: Self-pay | Admitting: Family Medicine

## 2024-04-01 MED ORDER — AZITHROMYCIN 250 MG PO TABS: ORAL_TABLET | ORAL | 0 refills | Status: AC

## 2024-04-02 ENCOUNTER — Ambulatory Visit (INDEPENDENT_AMBULATORY_CARE_PROVIDER_SITE_OTHER)

## 2024-04-02 ENCOUNTER — Other Ambulatory Visit: Payer: Self-pay | Admitting: *Deleted

## 2024-04-02 DIAGNOSIS — I639 Cerebral infarction, unspecified: Secondary | ICD-10-CM | POA: Diagnosis not present

## 2024-04-02 LAB — CUP PACEART REMOTE DEVICE CHECK
Date Time Interrogation Session: 20250521232600
Implantable Pulse Generator Implant Date: 20231117

## 2024-04-02 MED ORDER — PROMETHAZINE HCL 25 MG PO TABS: 25.0000 mg | ORAL_TABLET | Freq: Four times a day (QID) | ORAL | 0 refills | Status: DC | PRN

## 2024-04-04 ENCOUNTER — Other Ambulatory Visit: Payer: Self-pay | Admitting: Family Medicine

## 2024-04-05 ENCOUNTER — Ambulatory Visit: Payer: Self-pay | Admitting: Internal Medicine

## 2024-04-05 ENCOUNTER — Other Ambulatory Visit: Payer: Self-pay | Admitting: Family Medicine

## 2024-04-10 ENCOUNTER — Telehealth: Payer: Self-pay | Admitting: *Deleted

## 2024-04-10 NOTE — Telephone Encounter (Signed)
 Copied from CRM [id number]. Topic: General - Other >> Apr 10, 2024 12:28 PM Alyse July wrote: Reason for CRM: Patient would like a call back. Patient would like to know if she is contagious. Patient currently has Pneumonia. [phone]. Patient also mention that all details were sent via MyChart.  Patient sent mychart message and was informed, Per Dr. Waldo Guitar: Should not be contagious any more.  If still sick, should do the cxr.

## 2024-04-11 ENCOUNTER — Other Ambulatory Visit: Payer: Self-pay | Admitting: Family Medicine

## 2024-04-13 ENCOUNTER — Ambulatory Visit: Payer: Self-pay

## 2024-04-13 ENCOUNTER — Other Ambulatory Visit: Payer: Self-pay | Admitting: *Deleted

## 2024-04-13 DIAGNOSIS — J40 Bronchitis, not specified as acute or chronic: Secondary | ICD-10-CM

## 2024-04-13 NOTE — Telephone Encounter (Signed)
 Copied from CRM [id number]. Topic: Clinical - Red Word Triage >> Apr 13, 2024 10:28 AM Alethia Huxley E wrote: [facility] that prompted transfer to Nurse Triage: Patient fell this past Saturday, hit head and is having pain in her right leg and arm. Constant headache since fall. Patient feels off balance and has body weakness.  Chief Complaint: fall, hit head, headache, body weakness, pain in right leg and arm. Symptoms: see above Frequency: constant Pertinent Negatives: Patient denies cp, sob Disposition: [] ED /[] Urgent Care (no appt availability in office) / [] Appointment(In office/virtual)/ []  [facility]/ [] Home Care/ [x] Refused Recommended Disposition /[] [place] [facility]/ []  Follow-up with PCP Additional Notes: declined er; wants a chest xray ordered for her recent pneumonia.  Care advice given, denies questions; instructed to go to ER if becomes worse.   Reason for Disposition  [1] ACUTE NEURO SYMPTOM AND [2] now fine  (DEFINITION: difficult to awaken OR confused thinking and talking OR slurred speech OR weakness of arms OR unsteady walking)  Answer Assessment - Initial Assessment Questions 1. MECHANISM: "How did the injury happen?" For falls, ask: "What height did you fall from?" and "What surface did you fall against?"      Fell on Saturday getting out of shower; fell backward and hit head 2. ONSET: "When did the injury happen?" (Minutes or hours ago)      Saturday 3. NEUROLOGIC SYMPTOMS: "Was there any loss of consciousness?" "Are there any other neurological symptoms?"      TO ER 4. MENTAL STATUS: "Does the person know who they are, who you are, and where they are?"      na 5. LOCATION: "What part of the head was hit?"      na 6. SCALP APPEARANCE: "What does the scalp look like? Is it bleeding now?" If Yes, ask: "Is it difficult to stop?"      na 7. SIZE: For cuts, bruises, or swelling, ask: "How large is it?" (e.g., inches or centimeters)      na 8. PAIN: "Is there any  pain?" If Yes, ask: "How bad is it?"  (e.g., Scale 1-10; or mild, moderate, severe)     na 9. TETANUS: For any breaks in the skin, ask: "When was the last tetanus booster?"     NA 10. OTHER SYMPTOMS: "Do you have any other symptoms?" (e.g., neck pain, vomiting)       NA 11. PREGNANCY: "Is there any chance you are pregnant?" "When was your last menstrual period?"       NA  Protocols used: Head Injury-A-AH

## 2024-04-13 NOTE — Telephone Encounter (Signed)
 Order placed. Patient transferred to front office to schedule.

## 2024-04-13 NOTE — Telephone Encounter (Signed)
 Please see message below and advise.

## 2024-04-15 ENCOUNTER — Ambulatory Visit

## 2024-04-15 DIAGNOSIS — J40 Bronchitis, not specified as acute or chronic: Secondary | ICD-10-CM | POA: Diagnosis not present

## 2024-04-15 DIAGNOSIS — J449 Chronic obstructive pulmonary disease, unspecified: Secondary | ICD-10-CM | POA: Diagnosis not present

## 2024-04-16 ENCOUNTER — Other Ambulatory Visit: Payer: Self-pay | Admitting: Family Medicine

## 2024-04-16 DIAGNOSIS — H2513 Age-related nuclear cataract, bilateral: Secondary | ICD-10-CM | POA: Diagnosis not present

## 2024-04-16 DIAGNOSIS — H31092 Other chorioretinal scars, left eye: Secondary | ICD-10-CM | POA: Diagnosis not present

## 2024-04-16 DIAGNOSIS — H353123 Nonexudative age-related macular degeneration, left eye, advanced atrophic without subfoveal involvement: Secondary | ICD-10-CM | POA: Diagnosis not present

## 2024-04-16 DIAGNOSIS — H43813 Vitreous degeneration, bilateral: Secondary | ICD-10-CM | POA: Diagnosis not present

## 2024-04-16 DIAGNOSIS — H35033 Hypertensive retinopathy, bilateral: Secondary | ICD-10-CM | POA: Diagnosis not present

## 2024-04-16 DIAGNOSIS — H353211 Exudative age-related macular degeneration, right eye, with active choroidal neovascularization: Secondary | ICD-10-CM | POA: Diagnosis not present

## 2024-04-17 ENCOUNTER — Other Ambulatory Visit: Payer: Self-pay | Admitting: Family Medicine

## 2024-04-17 MED ORDER — BUTALBITAL-APAP-CAFFEINE 50-325-40 MG PO TABS: 1.0000 | ORAL_TABLET | Freq: Four times a day (QID) | ORAL | 1 refills | Status: DC | PRN

## 2024-04-18 ENCOUNTER — Ambulatory Visit: Payer: Self-pay | Admitting: Family Medicine

## 2024-04-18 NOTE — Progress Notes (Signed)
 No pneumonia.  Just some copd.  How is she feeling now

## 2024-04-20 ENCOUNTER — Other Ambulatory Visit: Payer: Self-pay | Admitting: Family Medicine

## 2024-04-20 MED ORDER — FLUCONAZOLE 150 MG PO TABS: 150.0000 mg | ORAL_TABLET | ORAL | 0 refills | Status: DC | PRN

## 2024-04-22 NOTE — Progress Notes (Signed)
 [facility] Summary Report / Loop Recorder

## 2024-05-04 ENCOUNTER — Ambulatory Visit (INDEPENDENT_AMBULATORY_CARE_PROVIDER_SITE_OTHER)

## 2024-05-04 ENCOUNTER — Encounter: Payer: Self-pay | Admitting: Neurology

## 2024-05-04 ENCOUNTER — Ambulatory Visit: Payer: PPO | Admitting: Neurology

## 2024-05-04 VITALS — BP 149/73 | HR 57 | Resp 96 | Ht 63.0 in | Wt 150.4 lb

## 2024-05-04 DIAGNOSIS — Z8673 Personal history of transient ischemic attack (TIA), and cerebral infarction without residual deficits: Secondary | ICD-10-CM | POA: Diagnosis not present

## 2024-05-04 DIAGNOSIS — G3184 Mild cognitive impairment, so stated: Secondary | ICD-10-CM

## 2024-05-04 DIAGNOSIS — I639 Cerebral infarction, unspecified: Secondary | ICD-10-CM | POA: Diagnosis not present

## 2024-05-04 DIAGNOSIS — M1991 Primary osteoarthritis, unspecified site: Secondary | ICD-10-CM | POA: Insufficient documentation

## 2024-05-04 NOTE — Patient Instructions (Addendum)
 I had a long d/w patient and her sister about her  cryptogenic stroke, memory loss and right sided visual field loss, migraine headaches,,risk for recurrent stroke/TIAs, personally independently reviewed imaging studies and stroke evaluation results and answered questions.Continue aspirin  81 mg daily  for secondary stroke prevention and maintain strict control of hypertension with blood pressure goal below 130/90, diabetes with hemoglobin A1c goal below 6.5% and lipids with LDL cholesterol goal below 70 mg/dL. I also advised the patient to eat a healthy diet with plenty of whole grains, cereals, fruits and vegetables, exercise regularly and maintain ideal body weight. Check screening carotid ultrasound study.  I advised the patient to participate in cognitively challenging activities like solving crossword puzzles, playing bridge and sudoku. Continue Fioricet  prn for migraines and avoid triggers. Particpate in regular stress relaxation activities like mediitation, yoga and exercises..  She was given a letter addressed to the [facility] stating that she is homebound due to her vision loss from macular degeneration and physical deficit from her stroke.  Followup in the future with me only as necessary or call earlier if necessary.

## 2024-05-04 NOTE — Progress Notes (Signed)
 [facility] [address] [place]. [place] [address] [phone]       OFFICE FOLLOW-UP NOTE  [name] Date of Birth:  Apr 22, 1951 Medical Record Number:  [id number]   HPI: Initial visit 08/28/2022 patient is a 73 year old pleasant Caucasian lady seen today for initial office follow-up visit following hospital consultation for stroke in July 2023.  She is accompanied by her sister.  History is obtained from them and review of electronic medical records.  I personally reviewed pertinent available imaging in PACS.Robin Arellano is a 73 y.o. female with past medical history of fibromyalgia, chronic back pain, s/p recent L4-L5 posterior lumbar fusion, CAD, HTN, HLD with statin intolerance, anxiety, depression, CKD and former smoker who presented on  05/14/22 after sustaining a fall in which her left leg gave out causing her to fall backwards. CT head revealed suspicious acute left infarct. MRI brain confirmed acute/subacute left PCA infarct. Neurology consulted for stroke workup .  On inquiry she stated that she had noted some wavy vision out of her right eye as well as vertigo and headaches about 7 weeks prior to her fall.  MRI confirmed subacute left PCA infarct and showed mild atherosclerotic changes with 3 mm supraclinoid ICA aneurysms bilaterally.  2D echo showed ejection fraction 60 to 65%.  LDL cholesterol is 101 mg percent.  Hemoglobin A1c was 5.0.  He was started on aspirin  and Plavix  for 3 weeks followed by aspirin  alone advised to have outpatient 30-day heart monitor which she has not done.  Patient states her right-sided peripheral vision loss persists.  She is also had trouble walking because of deconditioning.  She was in rehab to move back to the hospital as well as rehab when he was infected by COVID illness.  She states she is deconditioning.  Plan to do outpatient physical and occupational therapies.  Has chronic back issues and saw her neurosurgeon Dr.  Colon outpatient therapies.  Improving.  He was seen by cardiologist recently as well.  Patient with short-term memory difficulties since his nonprogressive and secondary proving.  He is tolerating aspirin  well without bruising or bleeding.  Her blood pressure is under good she is tolerating Pravachol  well without any aches or pains. Update 09/23/2023 : She returns for follow-up after last visit a year ago.  She is accompanied by sister.  Patient is doing well from stroke standpoint without recurrent stroke or TIA symptoms.  She remains on aspirin  which is tolerating well without bruising or bleeding.  She is tolerating Pravachol  well without muscle aches and pains.  Last lipid profile on 06/25/2023 showed optimal LDL cholesterol at 63 mg percent.  She has undergone loop recorder and so far paroxysmal A-fib has not yet been found.  She underwent EEG on 09/13/2022 which was normal.  Patient continues to have mild cognitive and memory difficulties which she feels are better but she still feels they are unchanged.  She has more trouble with short-term memory though remote memory is okay.  She is still independent actives of daily living.  She still has right-sided peripheral vision loss and was unable to drive.  She has new complaints of recurrent occipital headaches which occur once every 2 to 3 weeks.  She describes it as a sharp severe at times incapacitating.  Accompanied by nausea and light sensitivity.  She takes Fioricet  which seems to work quite well.  She does have prior history of migraine headaches but she feels she outgrew them.  She denies any  visual symptoms or focal neurological symptoms accompanying these headaches.  These headaches are quite often triggered by stress.    Update 05/04/2024 : She returns for follow-up after last visit more than 6 months ago.  She is accompanied by sister.  Patient states she is doing well.  She has had no recurrent TIA or stroke symptoms.  She remains on aspirin  which  she is tolerating well without significant bruising or bleeding.  She remains on Pravachol  and Zetia  for lipids and states last lipid profile checked a few months ago by her primary care physician was satisfactory.  She continues to have poor vision from her macular degeneration and is getting intraocular injections by her ophthalmologist.  She continues to have difficulty walking and dragging of the left leg and some left hand weakness and residual deficits from a stroke.  She is mostly homebound.  She is requesting a letter to the [facility] to enable her not to stand in line.  She states her memory difficulties are unchanged.  She actually did well on Mini-Mental testing today and scored 27/30 which is slightly improved compared to last visit.  She does participate in solving crossword puzzles and reads a lot.  She has no new complaints.  She states her migraines are doing well and denies any recent headaches.  She did undergo MR angiogram of the brain on 10/05/2023 which shows no significant large vessel intracranial stenosis.  Tiny infundibulum versus aneurysms are noted involving terminal right more than left carotid arteries which are unchanged from previous CT angiogram. ROS:   14 system review of systems is positive for joint difficulties, headache, nausea, light sensitivity, balance difficulties, decreased memory chronic back pain and all other systems negative  PMH:  Past Medical History:  Diagnosis Date   Allergy    Anemia    Anxiety    on meds   Back pain    Blood transfusion without reported diagnosis    Cataract    Chronic female pelvic pain    Chronic kidney disease    Coronary artery disease    mild, non-obstructive 11/2018   Depression    on meds   Family history of adverse reaction to anesthesia    sister had difficulty waking up   Fibromyalgia    H/O leukocytosis    Headache    Heart murmur    Hyperlipidemia    on meds   Hypertension    on meds   Macular degeneration of both  eyes    wet R, dry L   MI (myocardial infarction) (HCC)    Pt states she did not have a MI- EKG was normal, was GERD   Osteoarthritis    on meds   Ovarian cyst, right    PONV (postoperative nausea and vomiting)    Post-operative nausea and vomiting    Stroke (HCC) 05/14/2022   L PCA   SVD (spontaneous vaginal delivery)    x 2   Vitamin D  deficiency     Social History:  Social History   Socioeconomic History   Marital status: Married    Spouse name: don   Number of children: 2   Years of education: College   Highest education level: Not on file  Occupational History   Occupation: Realtor  Tobacco Use   Smoking status: Former    Current packs/day: 0.00    Average packs/day: 0.2 packs/day for 20.0 years (3.0 ttl pk-yrs)    Types: Cigarettes    Start date: 11/12/1978  Quit date: 11/12/1998    Years since quitting: 25.4   Smokeless tobacco: Never  Vaping Use   Vaping status: Never Used  Substance and Sexual Activity   Alcohol use: Not Currently   Drug use: No   Sexual activity: Not Currently    Birth control/protection: Post-menopausal, Surgical    Comment: HYSTERECTOMY  Other Topics Concern   Not on file  Social History Narrative   Lives at home with her husband Robin Arellano   Right handed   Caffeine : unsweet tea, 2 glasses daily   Social Drivers of Health   Financial Resource Strain: Patient Declined (01/04/2024)   Overall Financial Resource Strain (CARDIA)    Difficulty of Paying Living Expenses: Patient declined  Food Insecurity: No Food Insecurity (01/04/2024)   Hunger Vital Sign    Worried About Running Out of Food in the Last Year: Never true    Ran Out of Food in the Last Year: Never true  Transportation Needs: No Transportation Needs (01/04/2024)   PRAPARE - Transportation    Lack of Transportation (Medical): No    Lack of Transportation (Non-Medical): No  Physical Activity: Unknown (01/04/2024)   Exercise Vital Sign    Days of Exercise per Week: Patient  declined    Minutes of Exercise per Session: 20 min  Recent Concern: Physical Activity - Insufficiently Active (10/19/2023)   Exercise Vital Sign    Days of Exercise per Week: 4 days    Minutes of Exercise per Session: 20 min  Stress: Patient Declined (01/04/2024)   [employer] of Occupational Health - Occupational Stress Questionnaire    Feeling of Stress : Patient declined  Social Connections: Unknown (01/04/2024)   Social Connection and Isolation Panel    Frequency of Communication with Friends and Family: Three times a week    Frequency of Social Gatherings with Friends and Family: Once a week    Attends Religious Services: Patient declined    Database administrator or Organizations: No    Attends Banker Meetings: Patient declined    Marital Status: Married  Catering manager Violence: Not At Risk (10/23/2023)   Humiliation, Afraid, Rape, and Kick questionnaire    Fear of Current or Ex-Partner: No    Emotionally Abused: No    Physically Abused: No    Sexually Abused: No    Medications:   Current Outpatient Medications on File Prior to Visit  Medication Sig Dispense Refill   albuterol  (VENTOLIN  HFA) 108 (90 Base) MCG/ACT inhaler INHALE 2 PUFFS INTO THE LUNGS EVERY 6 HOURS AS NEEDED FOR WHEEZING OR SHORTNESS OF BREATH 6.7 g 1   ALPRAZolam  (XANAX ) 0.5 MG tablet TAKE 1 TABLET(0.5 MG) BY MOUTH TWICE DAILY AS NEEDED FOR ANXIETY OR SLEEP 35 tablet 2   amLODipine  (NORVASC ) 10 MG tablet Take 1 tablet (10 mg total) by mouth daily. 90 tablet 2   Ascorbic Acid  (VITAMIN C) 1000 MG tablet Take 1,000 mg by mouth in the morning.     aspirin  EC 81 MG tablet Take 1 tablet (81 mg total) by mouth daily. Swallow whole. 90 tablet 3   butalbital -acetaminophen -caffeine  (FIORICET ) 50-325-40 MG tablet Take 1 tablet by mouth every 6 (six) hours as needed for headache. 20 tablet 1   Cholecalciferol  (VITAMIN D ) 50 MCG (2000 UT) tablet Take 2,000 Units by mouth in the morning.     cloNIDine   (CATAPRES ) 0.1 MG tablet TAKE 1 TABLET(0.1 MG) BY MOUTH TWICE DAILY 90 tablet 3   ezetimibe  (ZETIA ) 10 MG tablet TAKE  1 TABLET(10 MG) BY MOUTH DAILY 90 tablet 2   fluconazole  (DIFLUCAN ) 150 MG tablet Take 1 tablet (150 mg total) by mouth every three (3) days as needed. 2 tablet 0   hydrALAZINE  (APRESOLINE ) 100 MG tablet TAKE 1 TABLET(100 MG) BY MOUTH THREE TIMES DAILY 270 tablet 2   HYDROcodone -acetaminophen  (NORCO) 10-325 MG tablet Take 1 tablet by mouth every 6 (six) hours as needed.     pantoprazole  (PROTONIX ) 40 MG tablet TAKE 1 TABLET(40 MG) BY MOUTH DAILY 90 tablet 3   Potassium Chloride  ER 20 MEQ TBCR TAKE 1 TABLET BY MOUTH IN THE MORNING AND AT BEDTIME 180 tablet 0   pravastatin  (PRAVACHOL ) 40 MG tablet Take 1 tablet (40 mg total) by mouth every evening. 90 tablet 3   promethazine  (PHENERGAN ) 25 MG tablet Take 1 tablet (25 mg total) by mouth every 6 (six) hours as needed for nausea or vomiting. 15 tablet 0   spironolactone  (ALDACTONE ) 25 MG tablet Take 25 mg by mouth daily.     valACYclovir  (VALTREX ) 500 MG tablet TAKE 1 TABLET BY MOUTH TWICE DAILY FOR 3 TO 5 DAYS THEN TAKE DAILY AS NEEDED 30 tablet 0   calcium  carbonate (OS-CAL) 1250 (500 Ca) MG chewable tablet 1 tablet with food Orally Twice a day (Patient not taking: Reported on 05/04/2024)     Calcium  Carbonate Antacid (TUMS PO) Take 1 tablet by mouth daily as needed (stomach pain). (Patient not taking: Reported on 05/04/2024)     methocarbamol  (ROBAXIN ) 500 MG tablet Take 1 tablet (500 mg total) by mouth every 6 (six) hours as needed for muscle spasms. (Patient not taking: Reported on 05/04/2024) 30 tablet 3   ondansetron  (ZOFRAN ) 4 MG tablet Take 1 tablet (4 mg total) by mouth every 8 (eight) hours as needed for nausea or vomiting. (Patient not taking: Reported on 05/04/2024) 20 tablet 3   No current facility-administered medications on file prior to visit.    Allergies:   Allergies  Allergen Reactions   Lidocaine  Hives   Zanaflex  [Tizanidine] Anaphylaxis, Hives, Itching and Swelling   Cymbalta [Duloxetine Hcl] Itching and Other (See Comments)    Spaced out feeling   Lyrica [Pregabalin] Itching, Swelling and Other (See Comments)    Spaced out feeling   Neurontin [Gabapentin] Itching and Swelling   Latex Rash    Physical Exam General: well developed, well nourished pleasant elderly Caucasian lady, seated, in no evident distress Head: head normocephalic and atraumatic.  Neck: supple with no carotid or supraclavicular bruits Cardiovascular: regular rate and rhythm, no murmurs Musculoskeletal: no deformity Skin:  no rash/petichiae Vascular:  Normal pulses all extremities Vitals:   05/04/24 1450  BP: (!) 149/73  Pulse: (!) 57  Resp: (!) 96   Neurologic Exam Mental Status: Awake and fully alert. Oriented to place and time. Recent and remote memory intact. Attention span, concentration and fund of knowledge appropriate. Mood and affect appropriate.  Diminished recall 2/3.  Able to name only 11 animals which can walk on 4 legs.  Clock drawing 4/4. Cranial Nerves: Fundoscopic exam not done. Pupils equal, briskly reactive to light. Extraocular movements full without nystagmus. Visual fields l to confrontation. Hearing intact. Facial sensation intact. Face, tongue, palate moves normally and symmetrically.  Motor: Normal bulk and tone. Normal strength in all tested extremity muscles. Sensory.: intact to touch ,pinprick .position and vibratory sensation.  Coordination: Rapid alternating movements normal in all extremities. Finger-to-nose and heel-to-shin performed accurately bilaterally. Gait and Station: Arises from chair without difficulty.  Stance is normal. Gait demonstrates normal stride length and balance and uses a walker.. Able to heel, toe and tandem walk with slight Reflexes: 1+ and symmetric. Toes downgoing.   NIHSS  2 Modified Rankin  2     05/04/2024    2:52 PM 10/23/2023    3:12 PM 09/23/2023    3:10 PM   MMSE - Mini Mental State Exam  Not completed:  Refused   Orientation to time 5  5  Orientation to Place 5  5  Registration 3  3  Attention/ Calculation 2  3  Recall 3  2  Language- name 2 objects 2  2  Language- repeat 1  1  Language- follow 3 step command 3  2  Language- read & follow direction 1  1  Write a sentence 1  1  Copy design 1  1  Total score 27  26     ASSESSMENT: 73 year old Caucasian lady with left PCA subacute infarct and July 2023 of cryptogenic etiology.  He has significant residual right-sided numbness and. and mild memory and cognitive impairment.  Vascular risk factors hypertension.  She also has tiny asymptomatic terminal CA aneurysms which are stable.SABRA        PLAN:I had a long d/w patient and her sister about her  cryptogenic stroke, memory loss and right sided visual field loss, migraine headaches,,risk for recurrent stroke/TIAs, personally independently reviewed imaging studies and stroke evaluation results and answered questions.Continue aspirin  81 mg daily  for secondary stroke prevention and maintain strict control of hypertension with blood pressure goal below 130/90, diabetes with hemoglobin A1c goal below 6.5% and lipids with LDL cholesterol goal below 70 mg/dL. I also advised the patient to eat a healthy diet with plenty of whole grains, cereals, fruits and vegetables, exercise regularly and maintain ideal body weight. Check screening carotid ultrasound study.  I advised the patient to participate in cognitively challenging activities like solving crossword puzzles, playing bridge and sudoku. Continue Fioricet  prn for migraines and avoid triggers. Particpate in regular stress relaxation activities like mediitation, yoga and exercises..  Continue conservative follow-up for her tiny bilateral terminal ICA aneurysms.  She was given a letter addressed to the [facility] stating that she is homebound due to her vision loss from macular degeneration and physical deficit from  her stroke.  Followup in the future with me only as necessary or call earlier if necessary. Greater than 50% of time during this 35 minute visit was spent on counseling,explanation of diagnosis, planning of further management, discussion with patient and family and coordination of care Eather Popp, MD Note: This document was prepared with digital dictation and possible smart phrase technology. Any transcriptional errors that result from this process are unintentional

## 2024-05-05 ENCOUNTER — Other Ambulatory Visit: Payer: Self-pay

## 2024-05-05 DIAGNOSIS — E785 Hyperlipidemia, unspecified: Secondary | ICD-10-CM

## 2024-05-05 DIAGNOSIS — I10 Essential (primary) hypertension: Secondary | ICD-10-CM

## 2024-05-05 DIAGNOSIS — Z79899 Other long term (current) drug therapy: Secondary | ICD-10-CM

## 2024-05-05 DIAGNOSIS — I2510 Atherosclerotic heart disease of native coronary artery without angina pectoris: Secondary | ICD-10-CM

## 2024-05-05 DIAGNOSIS — E782 Mixed hyperlipidemia: Secondary | ICD-10-CM

## 2024-05-05 LAB — CUP PACEART REMOTE DEVICE CHECK
Date Time Interrogation Session: 20250622232910
Implantable Pulse Generator Implant Date: 20231117

## 2024-05-05 MED ORDER — PRAVASTATIN SODIUM 40 MG PO TABS: 40.0000 mg | ORAL_TABLET | Freq: Every evening | ORAL | 1 refills | Status: DC

## 2024-05-10 ENCOUNTER — Ambulatory Visit: Payer: Self-pay | Admitting: Internal Medicine

## 2024-05-11 ENCOUNTER — Ambulatory Visit: Payer: PPO

## 2024-05-13 ENCOUNTER — Other Ambulatory Visit: Payer: Self-pay | Admitting: Family Medicine

## 2024-05-13 NOTE — Telephone Encounter (Signed)
 Too soon.  She's used 40 in 1 month

## 2024-05-14 ENCOUNTER — Other Ambulatory Visit: Payer: Self-pay | Admitting: Family Medicine

## 2024-05-14 DIAGNOSIS — M961 Postlaminectomy syndrome, not elsewhere classified: Secondary | ICD-10-CM | POA: Diagnosis not present

## 2024-05-14 DIAGNOSIS — G894 Chronic pain syndrome: Secondary | ICD-10-CM | POA: Diagnosis not present

## 2024-05-14 DIAGNOSIS — G5701 Lesion of sciatic nerve, right lower limb: Secondary | ICD-10-CM | POA: Diagnosis not present

## 2024-05-14 DIAGNOSIS — M25551 Pain in right hip: Secondary | ICD-10-CM | POA: Diagnosis not present

## 2024-05-14 MED ORDER — BUTALBITAL-APAP-CAFFEINE 50-325-40 MG PO TABS: 1.0000 | ORAL_TABLET | Freq: Four times a day (QID) | ORAL | 0 refills | Status: AC | PRN

## 2024-05-20 NOTE — Progress Notes (Signed)
 [facility] Summary Report / Loop Recorder

## 2024-05-25 ENCOUNTER — Ambulatory Visit (HOSPITAL_COMMUNITY)

## 2024-05-27 ENCOUNTER — Encounter: Payer: Self-pay | Admitting: Oncology

## 2024-05-27 ENCOUNTER — Other Ambulatory Visit: Payer: Self-pay | Admitting: Family Medicine

## 2024-05-27 NOTE — Telephone Encounter (Signed)
 Pharmacy stated last refill was 6/29 with 35 tablets sent, which gave patient a 17 day supply. Pharmacy stated when prescribing to make a note that medication must last 30 days.

## 2024-05-28 ENCOUNTER — Encounter: Payer: Self-pay | Admitting: *Deleted

## 2024-05-28 NOTE — Telephone Encounter (Signed)
 Patient notified of message below.

## 2024-06-04 ENCOUNTER — Ambulatory Visit

## 2024-06-04 DIAGNOSIS — H353211 Exudative age-related macular degeneration, right eye, with active choroidal neovascularization: Secondary | ICD-10-CM | POA: Diagnosis not present

## 2024-06-04 DIAGNOSIS — I639 Cerebral infarction, unspecified: Secondary | ICD-10-CM | POA: Diagnosis not present

## 2024-06-04 LAB — CUP PACEART REMOTE DEVICE CHECK
Date Time Interrogation Session: 20250723232745
Implantable Pulse Generator Implant Date: 20231117

## 2024-06-07 ENCOUNTER — Ambulatory Visit: Payer: Self-pay | Admitting: Internal Medicine

## 2024-06-08 ENCOUNTER — Other Ambulatory Visit: Payer: Self-pay | Admitting: Neurology

## 2024-06-08 ENCOUNTER — Ambulatory Visit: Payer: Self-pay | Admitting: Neurology

## 2024-06-08 ENCOUNTER — Ambulatory Visit (HOSPITAL_COMMUNITY)
Admission: RE | Admit: 2024-06-08 | Discharge: 2024-06-08 | Disposition: A | Discharge status: Home / Self Care | Source: Ambulatory Visit | Attending: Neurology | Admitting: Neurology

## 2024-06-08 DIAGNOSIS — I6529 Occlusion and stenosis of unspecified carotid artery: Secondary | ICD-10-CM

## 2024-06-08 DIAGNOSIS — Z8673 Personal history of transient ischemic attack (TIA), and cerebral infarction without residual deficits: Secondary | ICD-10-CM | POA: Insufficient documentation

## 2024-06-08 DIAGNOSIS — I6521 Occlusion and stenosis of right carotid artery: Secondary | ICD-10-CM

## 2024-06-08 NOTE — Progress Notes (Signed)
 Carotid arterial duplex has been completed.  Preliminary results given to Dr.Sethi.   Results can be found under chart review under CV PROC. 06/08/2024 3:39 PM Marquiz Sotelo RVT, RDMS

## 2024-06-08 NOTE — Progress Notes (Signed)
 Kindly inform the patient that carotid ultrasound study shows severe narrowing of the carotid artery on the right side which puts her at significant risk for stroke.  I would recommend referral to vascular surgery to discuss revascularization options if she is willing.  I have placed her referral in epic.  I tried calling patient listed phone number but she did not pick up.

## 2024-06-09 ENCOUNTER — Ambulatory Visit: Admitting: Student

## 2024-06-09 ENCOUNTER — Telehealth: Payer: Self-pay

## 2024-06-09 ENCOUNTER — Other Ambulatory Visit: Payer: Self-pay | Admitting: Family Medicine

## 2024-06-09 ENCOUNTER — Other Ambulatory Visit: Payer: Self-pay

## 2024-06-09 DIAGNOSIS — I6521 Occlusion and stenosis of right carotid artery: Secondary | ICD-10-CM

## 2024-06-09 NOTE — Telephone Encounter (Signed)
 Pt's sister, Dagoberto, called concerned about pt's carotid duplex w/>80% stenosis. Pt reports no stroke-like symptoms.    Consulted with Dr Magda.  He has agreed to see her 06/16/24. Pt will also need CTA of head and neck.  Pt knows to go to the ED if she experiences stroke-like symptoms.  Pt will be notified of upcoming appts.

## 2024-06-09 NOTE — Progress Notes (Signed)
 Opened in error

## 2024-06-10 NOTE — Addendum Note (Signed)
 Addended by: TAWNI DRILLING D on: 06/10/2024 10:41 AM   Modules accepted: Orders

## 2024-06-10 NOTE — Progress Notes (Signed)
 [facility] Summary Report / Loop Recorder

## 2024-06-12 ENCOUNTER — Ambulatory Visit (HOSPITAL_COMMUNITY)
Admission: RE | Admit: 2024-06-12 | Discharge: 2024-06-12 | Disposition: A | Discharge status: Home / Self Care | Source: Ambulatory Visit | Attending: Vascular Surgery | Admitting: Vascular Surgery

## 2024-06-12 DIAGNOSIS — I6521 Occlusion and stenosis of right carotid artery: Secondary | ICD-10-CM | POA: Diagnosis not present

## 2024-06-12 DIAGNOSIS — I6523 Occlusion and stenosis of bilateral carotid arteries: Secondary | ICD-10-CM | POA: Diagnosis not present

## 2024-06-12 DIAGNOSIS — I671 Cerebral aneurysm, nonruptured: Secondary | ICD-10-CM | POA: Diagnosis not present

## 2024-06-12 DIAGNOSIS — G9389 Other specified disorders of brain: Secondary | ICD-10-CM | POA: Diagnosis not present

## 2024-06-12 DIAGNOSIS — I6623 Occlusion and stenosis of bilateral posterior cerebral arteries: Secondary | ICD-10-CM | POA: Diagnosis not present

## 2024-06-12 MED ORDER — IOHEXOL 350 MG/ML SOLN
100.0000 mL | Freq: Once | INTRAVENOUS | Status: AC | PRN
  Administered 2024-06-12: 100 mL via INTRAVENOUS

## 2024-06-15 ENCOUNTER — Ambulatory Visit: Payer: PPO

## 2024-06-15 NOTE — Progress Notes (Unsigned)
 [facility] [place]  ASSESSMENT / PLAN: Robin Arellano is a 73 y.o. female with asymptomatic 50% bilateral carotid artery stenosis.   Recommend:  Abstinence from all tobacco products. Blood glucose control with goal A1c < 7%. Blood pressure control with goal blood pressure < 130/80 mmHg. Lipid reduction therapy with goal LDL-C < 55 mg/dL. Aspirin  81mg  by mouth daily. Atorvastatin  40-80mg  PO QD (or other high intensity statin therapy).  Patient due for surveillance of PAU. Will repeat this at follow up in 6 months.  CHIEF COMPLAINT: ***  HISTORY OF PRESENT ILLNESS: Robin Arellano is a 73 y.o. female ***  VASCULAR SURGICAL HISTORY: ***  VASCULAR RISK FACTORS: {FINDINGS; POSITIVE NEGATIVE:240-865-7873} history of stroke / transient ischemic attack. {FINDINGS; POSITIVE NEGATIVE:[id number]} history of coronary artery disease. *** history of PCI. *** history of CABG.  {FINDINGS; POSITIVE NEGATIVE:[id number]} history of diabetes mellitus. Last A1c ***. {FINDINGS; POSITIVE NEGATIVE:[id number]} history of smoking. *** actively smoking. {FINDINGS; POSITIVE NEGATIVE:[id number]} history of hypertension. *** drug regimen with *** control. {FINDINGS; POSITIVE NEGATIVE:[id number]} history of chronic kidney disease.  Last GFR ***. CKD {stage:30421363}. {FINDINGS; POSITIVE NEGATIVE:[id number]} history of chronic obstructive pulmonary disease, treated with ***.  FUNCTIONAL STATUS: ECOG performance status: {findings; ecog performance status:31780} Ambulatory status: {TNHAmbulation:25868}  CAREY 1 AND 3 YEAR INDEX Female (2pts) 75-79 or 80-84 (2pts) >84 (3pts) Dependence in toileting (1pt) Partial or full dependence in dressing (1pt) History of malignant neoplasm (2pts) CHF (3pts) COPD (1pts) CKD (3pts)  0-3 pts 6% 1 year mortality ; 21% 3 year mortality 4-5 pts 12% 1 year mortality ; 36% 3 year mortality >5 pts 21% 1 year mortality; 54% 3 year  mortality   Past Medical History:  Diagnosis Date   Allergy    Anemia    Anxiety    on meds   Back pain    Blood transfusion without reported diagnosis    Cataract    Chronic female pelvic pain    Chronic kidney disease    Coronary artery disease    mild, non-obstructive 11/2018   Depression    on meds   Family history of adverse reaction to anesthesia    sister had difficulty waking up   Fibromyalgia    H/O leukocytosis    Headache    Heart murmur    Hyperlipidemia    on meds   Hypertension    on meds   Macular degeneration of both eyes    wet R, dry L   MI (myocardial infarction) (HCC)    Pt states she did not have a MI- EKG was normal, was GERD   Osteoarthritis    on meds   Ovarian cyst, right    PONV (postoperative nausea and vomiting)    Post-operative nausea and vomiting    Stroke (HCC) 05/14/2022   L PCA   SVD (spontaneous vaginal delivery)    x 2   Vitamin D  deficiency     Past Surgical History:  Procedure Laterality Date   ABDOMINAL HYSTERECTOMY  1994   TAH.BSO   ANTERIOR CERVICAL DECOMP/DISCECTOMY FUSION  2019   APPENDECTOMY  1975   BACK SURGERY  2023   BIOPSY  05/12/2020   Procedure: BIOPSY;  Surgeon: Teressa Toribio SQUIBB, MD;  Location: [facility];  Service: Endoscopy;;   CARDIAC CATHETERIZATION  2020   CHOLECYSTECTOMY N/A 07/21/2020   Procedure: LAPAROSCOPIC CHOLECYSTECTOMY WITH INTRAOPERATIVE CHOLANGIOGRAM;  Surgeon: Aron Shoulders, MD;  Location: [facility] OR;  Service: General;  Laterality: N/A;   COLONOSCOPY  08/12/2017   Hx TA (piecemeal)Jacobs-MAC-suprep (good)   ESOPHAGOGASTRODUODENOSCOPY (EGD) WITH PROPOFOL  N/A 05/12/2020   Procedure: ESOPHAGOGASTRODUODENOSCOPY (EGD) WITH PROPOFOL ;  Surgeon: Teressa Toribio SQUIBB, MD;  Location: [facility];  Service: Endoscopy;  Laterality: N/A;   EUS N/A 05/12/2020   Procedure: UPPER ENDOSCOPIC ULTRASOUND (EUS) RADIAL;  Surgeon: Teressa Toribio SQUIBB, MD;  Location: [facility];  Service: Endoscopy;  Laterality: N/A;    KNEE SURGERY Bilateral 1996   x 2 - arthroscopic   LEFT HEART CATH AND CORONARY ANGIOGRAPHY N/A 11/25/2018   Procedure: LEFT HEART CATH AND CORONARY ANGIOGRAPHY;  Surgeon: Burnard Debby LABOR, MD;  Location: [facility];  Service: Cardiovascular;  Laterality: N/A;   PELVIC LAPAROSCOPY  1989   W LYSIS OF ADHESIONS/L SALPINGONEOSTOMY   TUBAL LIGATION     WISDOM TOOTH EXTRACTION      Family History  Problem Relation Age of Onset   Miscarriages / [place] Mother    Early death Mother    Cancer Mother    Uterine cancer Mother 84   Heart attack Mother    Arthritis Father    Cancer Father    Aneurysm Father    Other Sister        MGUS    Other Sister        MA   COPD Brother    Skin cancer Brother    Skin cancer Brother    Heart disease Paternal Grandfather    Colon cancer Neg Hx    Rectal cancer Neg Hx    Stomach cancer Neg Hx    Stroke Neg Hx    Neuropathy Neg Hx    Colon polyps Neg Hx    Esophageal cancer Neg Hx     Social History   Socioeconomic History   Marital status: Married    Spouse name: don   Number of children: 2   Years of education: College   Highest education level: Not on file  Occupational History   Occupation: Realtor  Tobacco Use   Smoking status: Former    Current packs/day: 0.00    Average packs/day: 0.2 packs/day for 20.0 years (3.0 ttl pk-yrs)    Types: Cigarettes    Start date: 11/12/1978    Quit date: 11/12/1998    Years since quitting: 25.6   Smokeless tobacco: Never  Vaping Use   Vaping status: Never Used  Substance and Sexual Activity   Alcohol use: Not Currently   Drug use: No   Sexual activity: Not Currently    Birth control/protection: Post-menopausal, Surgical    Comment: HYSTERECTOMY  Other Topics Concern   Not on file  Social History Narrative   Lives at home with her husband Todd   Right handed   Caffeine : unsweet tea, 2 glasses daily   Social Drivers of Health   Financial Resource Strain: Patient Declined  (01/04/2024)   Overall Financial Resource Strain (CARDIA)    Difficulty of Paying Living Expenses: Patient declined  Food Insecurity: No Food Insecurity (01/04/2024)   Hunger Vital Sign    Worried About Running Out of Food in the Last Year: Never true    Ran Out of Food in the Last Year: Never true  Transportation Needs: No Transportation Needs (01/04/2024)   PRAPARE - Transportation    Lack of Transportation (Medical): No    Lack of Transportation (Non-Medical): No  Physical Activity: Unknown (01/04/2024)   Exercise Vital Sign    Days of Exercise per Week: Patient declined    Minutes of  Exercise per Session: 20 min  Recent Concern: Physical Activity - Insufficiently Active (10/19/2023)   Exercise Vital Sign    Days of Exercise per Week: 4 days    Minutes of Exercise per Session: 20 min  Stress: Patient Declined (01/04/2024)   [employer] of Occupational Health - Occupational Stress Questionnaire    Feeling of Stress : Patient declined  Social Connections: Unknown (01/04/2024)   Social Connection and Isolation Panel    Frequency of Communication with Friends and Family: Three times a week    Frequency of Social Gatherings with Friends and Family: Once a week    Attends Religious Services: Patient declined    Database administrator or Organizations: No    Attends Banker Meetings: Patient declined    Marital Status: Married  Catering manager Violence: Not At Risk (10/23/2023)   Humiliation, Afraid, Rape, and Kick questionnaire    Fear of Current or Ex-Partner: No    Emotionally Abused: No    Physically Abused: No    Sexually Abused: No    Allergies  Allergen Reactions   Lidocaine  Hives   Zanaflex [Tizanidine] Anaphylaxis, Hives, Itching and Swelling   Cymbalta [Duloxetine Hcl] Itching and Other (See Comments)    Spaced out feeling   Lyrica [Pregabalin] Itching, Swelling and Other (See Comments)    Spaced out feeling   Neurontin [Gabapentin] Itching and  Swelling   Latex Rash    Current Outpatient Medications  Medication Sig Dispense Refill   albuterol  (VENTOLIN  HFA) 108 (90 Base) MCG/ACT inhaler INHALE 2 PUFFS INTO THE LUNGS EVERY 6 HOURS AS NEEDED FOR WHEEZING OR SHORTNESS OF BREATH 6.7 g 1   ALPRAZolam  (XANAX ) 0.5 MG tablet TAKE 1 TABLET(0.5 MG) BY MOUTH TWICE DAILY AS NEEDED FOR ANXIETY OR SLEEP 35 tablet 0   amLODipine  (NORVASC ) 10 MG tablet Take 1 tablet (10 mg total) by mouth daily. 90 tablet 2   Ascorbic Acid  (VITAMIN C) 1000 MG tablet Take 1,000 mg by mouth in the morning.     aspirin  EC 81 MG tablet Take 1 tablet (81 mg total) by mouth daily. Swallow whole. 90 tablet 3   butalbital -acetaminophen -caffeine  (FIORICET ) 50-325-40 MG tablet Take 1 tablet by mouth every 6 (six) hours as needed for headache. 20 tablet 0   calcium  carbonate (OS-CAL) 1250 (500 Ca) MG chewable tablet 1 tablet with food Orally Twice a day (Patient not taking: Reported on 05/04/2024)     Calcium  Carbonate Antacid (TUMS PO) Take 1 tablet by mouth daily as needed (stomach pain). (Patient not taking: Reported on 05/04/2024)     Cholecalciferol  (VITAMIN D ) 50 MCG (2000 UT) tablet Take 2,000 Units by mouth in the morning.     cloNIDine  (CATAPRES ) 0.1 MG tablet TAKE 1 TABLET(0.1 MG) BY MOUTH TWICE DAILY 90 tablet 3   ezetimibe  (ZETIA ) 10 MG tablet TAKE 1 TABLET(10 MG) BY MOUTH DAILY 90 tablet 2   fluconazole  (DIFLUCAN ) 150 MG tablet Take 1 tablet (150 mg total) by mouth every three (3) days as needed. 2 tablet 0   hydrALAZINE  (APRESOLINE ) 100 MG tablet TAKE 1 TABLET(100 MG) BY MOUTH THREE TIMES DAILY 270 tablet 2   HYDROcodone -acetaminophen  (NORCO) 10-325 MG tablet Take 1 tablet by mouth every 6 (six) hours as needed.     methocarbamol  (ROBAXIN ) 500 MG tablet Take 1 tablet (500 mg total) by mouth every 6 (six) hours as needed for muscle spasms. (Patient not taking: Reported on 05/04/2024) 30 tablet 3   ondansetron  (ZOFRAN ) 4  MG tablet Take 1 tablet (4 mg total) by mouth  every 8 (eight) hours as needed for nausea or vomiting. (Patient not taking: Reported on 05/04/2024) 20 tablet 3   pantoprazole  (PROTONIX ) 40 MG tablet TAKE 1 TABLET(40 MG) BY MOUTH DAILY 90 tablet 3   Potassium Chloride  ER 20 MEQ TBCR TAKE 1 TABLET BY MOUTH IN THE MORNING AND AT BEDTIME 180 tablet 0   pravastatin  (PRAVACHOL ) 40 MG tablet Take 1 tablet (40 mg total) by mouth every evening. 90 tablet 1   promethazine  (PHENERGAN ) 25 MG tablet Take 1 tablet (25 mg total) by mouth every 6 (six) hours as needed for nausea or vomiting. 15 tablet 0   spironolactone  (ALDACTONE ) 25 MG tablet Take 25 mg by mouth daily.     valACYclovir  (VALTREX ) 500 MG tablet TAKE 1 TABLET BY MOUTH TWICE DAILY FOR 3 TO 5 DAYS THEN TAKE DAILY AS NEEDED 30 tablet 0   No current facility-administered medications for this visit.    PHYSICAL EXAM There were no vitals filed for this visit.  Constitutional: *** appearing. *** distress. Appears *** nourished.  Neurologic: CN ***. *** focal findings. *** sensory loss. Psychiatric: *** Mood and affect symmetric and appropriate. Eyes: *** No icterus. No conjunctival pallor. Ears, nose, throat: *** mucous membranes moist. Midline trachea.  Cardiac: *** rate and rhythm.  Respiratory: *** unlabored. Abdominal: *** soft, non-tender, non-distended.  Peripheral vascular: *** Extremity: *** edema. *** cyanosis. *** pallor.  Skin: *** gangrene. *** ulceration.  Lymphatic: *** Stemmer's sign. *** palpable lymphadenopathy.    PERTINENT LABORATORY AND RADIOLOGIC DATA  Most recent CBC    Latest Ref Rng & Units 10/07/2023    2:39 PM 04/11/2023    2:51 PM 11/23/2022   10:56 AM  CBC  WBC 4.0 - 10.5 K/uL 9.9  7.8  8.6   Hemoglobin 12.0 - 15.0 g/dL 88.9  88.9  88.7   Hematocrit 36.0 - 46.0 % 33.4  33.4  34.0   Platelets 150 - 400 K/uL 249  266  277      Most recent CMP    Latest Ref Rng & Units 10/07/2023    2:39 PM 04/11/2023    2:51 PM 11/23/2022   10:56 AM  CMP  Glucose  70 - 99 mg/dL 895  95  895   BUN 8 - 23 mg/dL 24  28  28    Creatinine 0.44 - 1.00 mg/dL 8.49  8.26  8.20   Sodium 135 - 145 mmol/L 136  137  138   Potassium 3.5 - 5.1 mmol/L 4.5  4.1  4.2   Chloride 98 - 111 mmol/L 105  106  103   CO2 22 - 32 mmol/L 22  20  22    Calcium  8.9 - 10.3 mg/dL 9.2  9.5  89.7   Total Protein 6.5 - 8.1 g/dL 7.8  8.1    Total Bilirubin <1.2 mg/dL 0.3  0.3    Alkaline Phos 38 - 126 U/L 102  106    AST 15 - 41 U/L 13  15    ALT 0 - 44 U/L 5  7      Renal function CrCl cannot be calculated (Patient's most recent lab result is older than the maximum 21 days allowed.).  Hgb A1c MFr Bld (%)  Date Value  05/16/2022 5.0    LDL Chol Calc (NIH)  Date Value Ref Range Status  09/28/2022 65 0 - 99 mg/dL Final   LDL Cholesterol  Date Value  Ref Range Status  06/25/2023 63 0 - 99 mg/dL Final     Vascular Imaging: ***  [place] Welliver N. Magda, MD FACS [facility] Phone Number: [phone] 06/15/2024 3:35 PM   Total time spent on preparing this encounter including chart review, data review, collecting history, examining the patient, and coordinating care: {tnhtimebilling:26202} {billinglist:27273}  Portions of this report may have been transcribed using voice recognition software.  Every effort has been made to ensure accuracy; however, inadvertent computerized transcription errors may still be present.

## 2024-06-16 ENCOUNTER — Ambulatory Visit: Attending: Vascular Surgery | Admitting: Vascular Surgery

## 2024-06-16 ENCOUNTER — Encounter: Payer: Self-pay | Admitting: Vascular Surgery

## 2024-06-16 VITALS — BP 145/71 | HR 51 | Temp 97.9°F | Ht 63.0 in | Wt 150.0 lb

## 2024-06-16 DIAGNOSIS — I6523 Occlusion and stenosis of bilateral carotid arteries: Secondary | ICD-10-CM | POA: Diagnosis not present

## 2024-06-17 ENCOUNTER — Other Ambulatory Visit: Payer: Self-pay

## 2024-06-17 DIAGNOSIS — I6523 Occlusion and stenosis of bilateral carotid arteries: Secondary | ICD-10-CM

## 2024-06-22 NOTE — Progress Notes (Signed)
 Pharmacy Quality Measure Review  This patient is appearing on a report for being at risk of failing the adherence measure for cholesterol (statin) medications this calendar year.   Medication: pravastatin  40 mg daily Last fill date: 05/05/2024 for 90 day supply  Insurance report was not up to date. No action needed at this time.    Ensured patient picked-up pravastatin  via [facility], as well as Chiropractor. Patient confirmed to have picked-up her prescription on the 24th of [place].   Robin Arellano  [place], [facility]

## 2024-06-24 ENCOUNTER — Other Ambulatory Visit: Payer: Self-pay | Admitting: Family Medicine

## 2024-06-24 ENCOUNTER — Encounter: Payer: Self-pay | Admitting: Physician Assistant

## 2024-06-24 ENCOUNTER — Ambulatory Visit: Attending: Cardiovascular Disease | Admitting: Physician Assistant

## 2024-06-24 VITALS — BP 150/66 | HR 55 | Resp 16 | Ht 63.0 in | Wt 150.8 lb

## 2024-06-24 DIAGNOSIS — I10 Essential (primary) hypertension: Secondary | ICD-10-CM | POA: Diagnosis not present

## 2024-06-24 DIAGNOSIS — Z95818 Presence of other cardiac implants and grafts: Secondary | ICD-10-CM | POA: Diagnosis not present

## 2024-06-24 NOTE — Progress Notes (Addendum)
  Cardiology Office Note:  .   Date:  06/24/2024  ID:  Robin Arellano, Robin Arellano February 16, 1951, MRN [id number] PCP: Wendolyn Jenkins Jansky, MD  [facility] Providers Cardiologist:  Shelda Bruckner, MD {  History of Present Illness: .   Robin Arellano is a 73 y.o. female w/PMHx of  HTN, HLD Stroke (ILR placed) Nonobstructive CAD seen on cath 2020.  Chronic back/neck pain (prior c-spine sx)  Saw Dr. Waddell 11/17/223, this visit to discuss ILR for cryptogenic stroke > implanted  She saw Dr. Bruckner 08/23/23, discussed reproducible CPs, good exertional capacity  Following with neurology, recently found w/carotid disease, saw [facility] 06/16/24, by his review, CT angiogram noted less severe disease, elevated duplex reading is likely from tortuosity.planned for close f/u.  Today's visit is scheduled as an annual visit ROS:   She is accompanied by her sister No palpitations or cardiac awareness of any kind No CP > mentions L sided pain towards her axilla with certain arm movement only No near syncope or syncope No SOB   Device information MDT ILR 09/28/22 for cryptogenic stroke   Studies Reviewed: SABRA    EKG not done today  DEVICE interrogation carelink from today and reviewed by myself Battery is good SR No observations or arrhythmias to date   Echo  05/16/2022: 1. Left ventricular ejection fraction, by estimation, is 60 to 65%. The  left ventricle has normal function. The left ventricle has no regional  wall motion abnormalities. There is mild concentric left ventricular  hypertrophy. Left ventricular diastolic  parameters are indeterminate.   2. Right ventricular systolic function is normal. The right ventricular  size is normal. There is normal pulmonary artery systolic pressure.   3. Right atrial size was mildly dilated.   4. The mitral valve is normal in structure. Trivial mitral valve  regurgitation. No evidence of mitral stenosis.   5. The aortic valve is  normal in structure. Aortic valve regurgitation is  not visualized. Aortic valve sclerosis is present, with no evidence of  aortic valve stenosis.   6. The inferior vena cava is normal in size with greater than 50%  respiratory variability, suggesting right atrial pressure of 3 mmHg.   Risk Assessment/Calculations:    Physical Exam:   VS:  LMP  (LMP Unknown)    Wt Readings from Last 3 Encounters:  06/16/24 150 lb (68 kg)  05/04/24 150 lb 6.4 oz (68.2 kg)  03/24/24 150 lb 6 oz (68.2 kg)    GEN: Well nourished, well developed in no acute distress NECK: No JVD; No carotid bruits CARDIAC: RRR, no murmurs, rubs, gallops RESPIRATORY:  CTA b/l without rales, wheezing or rhonchi  ABDOMEN: Soft, non-tender, non-distended EXTREMITIES: No edema; No deformity   ILR site: is stable, no thinning, fluctuation, tethering  ASSESSMENT AND PLAN: .    ILR Cryptogenic stroke No AFib to date We can see her PRN in clinic  3.  HTN Recheck is 140/60, she reports better at ome  Dispo: remotes as usual, in clinic with EP as needed  Signed, Charlies Macario Arthur, PA-C

## 2024-06-24 NOTE — Patient Instructions (Signed)
 Medication Instructions:   Your physician recommends that you continue on your current medications as directed. Please refer to the Current Medication list given to you today.   *If you need a refill on your cardiac medications before your next appointment, please call your pharmacy*   Lab Work: NONE ORDERED  TODAY     If you have labs (blood work) drawn today and your tests are completely normal, you will receive your results only by: MyChart Message (if you have MyChart) OR A paper copy in the mail If you have any lab test that is abnormal or we need to change your treatment, we will call you to review the results.    Testing/Procedures: NONE ORDERED  TODAY   Follow-Up:  At [facility], you and your health needs are our priority.  As part of our continuing mission to provide you with exceptional heart care, our providers are all part of one team.  This team includes your primary Cardiologist (physician) and Advanced Practice Providers or APPs (Physician Assistants and Nurse Practitioners) who all work together to provide you with the care you need, when you need it.   Your next appointment:  WITH EP ( ELECTROPHYSIOLOGY)  DEPARTMENT CONTACT  HEART CARE 336 [phone] AS NEEDED FOR  ANY CARDIAC RELATED SYMPTOMS   We recommend signing up for the patient portal called MyChart.  Sign up information is provided on this After Visit Summary.  MyChart is used to connect with patients for Virtual Visits (Telemedicine).  Patients are able to view lab/test results, encounter notes, upcoming appointments, etc.  Non-urgent messages can be sent to your provider as well.   To learn more about what you can do with MyChart, go to [email or web address].    Other Instructions

## 2024-07-06 ENCOUNTER — Other Ambulatory Visit: Payer: Self-pay | Admitting: *Deleted

## 2024-07-06 ENCOUNTER — Inpatient Hospital Stay (HOSPITAL_BASED_OUTPATIENT_CLINIC_OR_DEPARTMENT_OTHER): Payer: PPO | Admitting: Oncology

## 2024-07-06 ENCOUNTER — Ambulatory Visit

## 2024-07-06 ENCOUNTER — Inpatient Hospital Stay: Payer: PPO | Attending: Oncology

## 2024-07-06 VITALS — BP 142/58 | HR 60 | Temp 98.1°F | Resp 18 | Ht 63.0 in | Wt 151.1 lb

## 2024-07-06 DIAGNOSIS — D649 Anemia, unspecified: Secondary | ICD-10-CM | POA: Insufficient documentation

## 2024-07-06 DIAGNOSIS — N189 Chronic kidney disease, unspecified: Secondary | ICD-10-CM | POA: Insufficient documentation

## 2024-07-06 DIAGNOSIS — D631 Anemia in chronic kidney disease: Secondary | ICD-10-CM

## 2024-07-06 DIAGNOSIS — I639 Cerebral infarction, unspecified: Secondary | ICD-10-CM | POA: Diagnosis not present

## 2024-07-06 DIAGNOSIS — I129 Hypertensive chronic kidney disease with stage 1 through stage 4 chronic kidney disease, or unspecified chronic kidney disease: Secondary | ICD-10-CM | POA: Diagnosis not present

## 2024-07-06 DIAGNOSIS — Z8673 Personal history of transient ischemic attack (TIA), and cerebral infarction without residual deficits: Secondary | ICD-10-CM | POA: Insufficient documentation

## 2024-07-06 DIAGNOSIS — R531 Weakness: Secondary | ICD-10-CM | POA: Diagnosis not present

## 2024-07-06 LAB — CBC WITH DIFFERENTIAL (CANCER CENTER ONLY)
Abs Immature Granulocytes: 0.04 K/uL (ref 0.00–0.07)
Basophils Absolute: 0.1 K/uL (ref 0.0–0.1)
Basophils Relative: 1 %
Eosinophils Absolute: 0.2 K/uL (ref 0.0–0.5)
Eosinophils Relative: 2 %
HCT: 33.4 % — ABNORMAL LOW (ref 36.0–46.0)
Hemoglobin: 10.9 g/dL — ABNORMAL LOW (ref 12.0–15.0)
Immature Granulocytes: 0 %
Lymphocytes Relative: 16 %
Lymphs Abs: 1.7 K/uL (ref 0.7–4.0)
MCH: 32.7 pg (ref 26.0–34.0)
MCHC: 32.6 g/dL (ref 30.0–36.0)
MCV: 100.3 fL — ABNORMAL HIGH (ref 80.0–100.0)
Monocytes Absolute: 0.7 K/uL (ref 0.1–1.0)
Monocytes Relative: 7 %
Neutro Abs: 7.5 K/uL (ref 1.7–7.7)
Neutrophils Relative %: 74 %
Platelet Count: 248 K/uL (ref 150–400)
RBC: 3.33 MIL/uL — ABNORMAL LOW (ref 3.87–5.11)
RDW: 12.8 % (ref 11.5–15.5)
WBC Count: 10.2 K/uL (ref 4.0–10.5)
nRBC: 0 % (ref 0.0–0.2)

## 2024-07-06 LAB — RETICULOCYTES
Immature Retic Fract: 6.3 % (ref 2.3–15.9)
RBC.: 3.36 MIL/uL — ABNORMAL LOW (ref 3.87–5.11)
Retic Count, Absolute: 48 K/uL (ref 19.0–186.0)
Retic Ct Pct: 1.4 % (ref 0.4–3.1)

## 2024-07-06 NOTE — Progress Notes (Signed)
  [facility] OFFICE PROGRESS NOTE   Diagnosis: Anemia  INTERVAL HISTORY:   Ms. Buchinger returns as scheduled.  She has diffuse pain and is followed in the pain clinic.  No fever or night sweats.  Good appetite.  She reports generalized weakness.  Objective:  Vital signs in last 24 hours:  Blood pressure (!) 142/58, pulse 60, temperature 98.1 F (36.7 C), temperature source Temporal, resp. rate 18, height 5' 3 (1.6 m), weight 151 lb 1.6 oz (68.5 kg), SpO2 95%.   Lymphatics: No cervical, supraclavicular, axillary, or inguinal nodes Resp: Lungs clear bilaterally Cardio: Regular rate and rhythm GI: No hepatosplenomegaly Vascular: No leg edema    Lab Results:  Lab Results  Component Value Date   WBC 10.2 07/06/2024   HGB 10.9 (L) 07/06/2024   HCT 33.4 (L) 07/06/2024   MCV 100.3 (H) 07/06/2024   PLT 248 07/06/2024   NEUTROABS 7.5 07/06/2024    CMP  Lab Results  Component Value Date   NA 136 10/07/2023   K 4.5 10/07/2023   CL 105 10/07/2023   CO2 22 10/07/2023   GLUCOSE 104 (H) 10/07/2023   BUN 24 (H) 10/07/2023   CREATININE 1.50 (H) 10/07/2023   CALCIUM  9.2 10/07/2023   PROT 7.8 10/07/2023   ALBUMIN 4.5 10/07/2023   AST 13 (L) 10/07/2023   ALT 5 10/07/2023   ALKPHOS 102 10/07/2023   BILITOT 0.3 10/07/2023   GFRNONAA 37 (L) 10/07/2023   GFRAA 56 (L) 10/13/2020     Medications: I have reviewed the patient's current medications.   Assessment/Plan: Anemia 09/06/2022 SPEP/IFE-no M spike, polyclonal increase in 1 or more immunoglobulins, IgA mildly elevated, IgG and IgM normal; serum light chains kappa and lambda both elevated with normal ratio; erythropoietin  mildly elevated; B12 normal; LDH normal; creatinine elevated at 1.67 Chronic kidney disease Hypertension History of stroke History of pancreatitis Osteoarthritis, rheumatoid arthritis?     Disposition: Ms. Kocak has persistent mild anemia.  There is no obvious explanation for the  anemia.  The anemia may be related to chronic renal failure, chronic disease , or myelodysplasia.  She denies alcohol use.  I have a low clinical suspicion for hemolysis and liver disease.  We discussed the possible recommendation for a future bone marrow biopsy.  The plan is to continue observation for now.  She will return for an office and lab visit in 6 months.  Arley Hof, MD  07/06/2024  3:34 PM

## 2024-07-07 ENCOUNTER — Ambulatory Visit (INDEPENDENT_AMBULATORY_CARE_PROVIDER_SITE_OTHER): Payer: PPO | Admitting: Family Medicine

## 2024-07-07 ENCOUNTER — Encounter: Payer: Self-pay | Admitting: Family Medicine

## 2024-07-07 VITALS — BP 132/78 | HR 50 | Temp 97.5°F | Resp 18 | Ht 63.0 in | Wt 150.1 lb

## 2024-07-07 DIAGNOSIS — E782 Mixed hyperlipidemia: Secondary | ICD-10-CM | POA: Diagnosis not present

## 2024-07-07 DIAGNOSIS — D500 Iron deficiency anemia secondary to blood loss (chronic): Secondary | ICD-10-CM

## 2024-07-07 DIAGNOSIS — F419 Anxiety disorder, unspecified: Secondary | ICD-10-CM | POA: Diagnosis not present

## 2024-07-07 DIAGNOSIS — I10 Essential (primary) hypertension: Secondary | ICD-10-CM | POA: Diagnosis not present

## 2024-07-07 DIAGNOSIS — I2510 Atherosclerotic heart disease of native coronary artery without angina pectoris: Secondary | ICD-10-CM | POA: Diagnosis not present

## 2024-07-07 DIAGNOSIS — N1832 Chronic kidney disease, stage 3b: Secondary | ICD-10-CM | POA: Diagnosis not present

## 2024-07-07 DIAGNOSIS — M2550 Pain in unspecified joint: Secondary | ICD-10-CM | POA: Diagnosis not present

## 2024-07-07 DIAGNOSIS — G43009 Migraine without aura, not intractable, without status migrainosus: Secondary | ICD-10-CM | POA: Diagnosis not present

## 2024-07-07 LAB — CUP PACEART REMOTE DEVICE CHECK
Date Time Interrogation Session: 20250823232216
Implantable Pulse Generator Implant Date: 20231117

## 2024-07-07 MED ORDER — ALPRAZOLAM 0.5 MG PO TABS: 0.5000 mg | ORAL_TABLET | Freq: Two times a day (BID) | ORAL | 5 refills | Status: DC | PRN

## 2024-07-07 MED ORDER — POTASSIUM CHLORIDE ER 20 MEQ PO TBCR: 20.0000 meq | EXTENDED_RELEASE_TABLET | Freq: Two times a day (BID) | ORAL | 0 refills | Status: DC

## 2024-07-07 NOTE — Patient Instructions (Signed)
 It was very nice to see you today!  Get labs from [facility].    PLEASE NOTE:  If you had any lab tests please let us  know if you have not heard back within a few days. You may see your results on MyChart before we have a chance to review them but we will give you a call once they are reviewed by us . If we ordered any referrals today, please let us  know if you have not heard from their office within the next week.   Please try these tips to maintain a healthy lifestyle:  Eat most of your calories during the day when you are active. Eliminate processed foods including packaged sweets (pies, cakes, cookies), reduce intake of potatoes, white bread, white pasta, and white rice. Look for whole grain options, oat flour or almond flour.  Each meal should contain half fruits/vegetables, one quarter protein, and one quarter carbs (no bigger than a computer mouse).  Cut down on sweet beverages. This includes juice, soda, and sweet tea. Also watch fruit intake, though this is a healthier sweet option, it still contains natural sugar! Limit to 3 servings daily.  Drink at least 1 glass of water with each meal and aim for at least 8 glasses per day  Exercise at least 150 minutes every week.

## 2024-07-07 NOTE — Progress Notes (Unsigned)
 Subjective:     Patient ID: Robin Arellano, female    DOB: [birth year], 73 y.o.   MRN: [id number]  Chief Complaint  Patient presents with   Medical Management of Chronic Issues    6 month follow-up Wants pcp to look at blood work from Dr. Audery    HPI Discussed the use of AI scribe software for clinical note transcription with the patient, who gave verbal consent to proceed.  History of Present Illness Robin Arellano is a 73 year old female with anemia and carotid artery disease who presents for follow-up of her anemia and carotid artery disease. She is accompanied by her sister.  She has anemia characterized by a low red blood cell count and enlarged red blood cells, with a hemoglobin level of 10.9. Her hematologist is monitoring the condition, focusing on the production of new red blood cells. She has not undergone a bone marrow test, and she has not seen a gastroenterologist for any gastrointestinal-related tests since her previous doctor retired.  She has a history of carotid artery disease with a 50% blockage in her carotid arteries. A Doppler study was performed on her neck, and she underwent a brain scan. Managed by vascular  She is on multiple medications including amlodipine ,  clonidine  twice a day, Zetia  10 mg, hydralazine  100 mg three times a day, Protonix  40 mg, pravastatin  40 mg, spironolactone  25 mg (half a pill), and potassium supplements twice a day. Her potassium levels have been low despite supplementation. She is also on Xanax , which she takes nightly and as needed, particularly before eye doctor visits.  She has received injections for macular degeneration.  She also mentions a low heart rate, with a recent reading of 56, and her blood pressure typically stays around the 130s. No chest pain or shortness of breath recently, although she sometimes experiences chest discomfort during exercise.  Family history includes a sister with myelodysplastic syndrome  (MDS). She has a new pain management doctor and is transitioning her Fioricet  prescriptions for migraine to this provider.  For chronic pain/arthralgias-takes hydrocodone -seeing pain mgmt  CKD-seeing neph next month and will get labs there    There are no preventive care reminders to display for this patient.  Past Medical History:  Diagnosis Date   Allergy    Anemia    Anxiety    on meds   Back pain    Blood transfusion without reported diagnosis    Cataract    Chronic female pelvic pain    Chronic kidney disease    Coronary artery disease    mild, non-obstructive 11/2018   Depression    on meds   Family history of adverse reaction to anesthesia    sister had difficulty waking up   Fibromyalgia    H/O leukocytosis    Headache    Heart murmur    Hyperlipidemia    on meds   Hypertension    on meds   Macular degeneration of both eyes    wet R, dry L   MI (myocardial infarction) (HCC)    Pt states she did not have a MI- EKG was normal, was GERD   Osteoarthritis    on meds   Ovarian cyst, right    PONV (postoperative nausea and vomiting)    Post-operative nausea and vomiting    Stroke (HCC) 05/14/2022   L PCA   SVD (spontaneous vaginal delivery)    x 2   Vitamin D  deficiency  Past Surgical History:  Procedure Laterality Date   ABDOMINAL HYSTERECTOMY  1994   TAH.BSO   ANTERIOR CERVICAL DECOMP/DISCECTOMY FUSION  2019   APPENDECTOMY  1975   BACK SURGERY  2023   BIOPSY  05/12/2020   Procedure: BIOPSY;  Surgeon: Teressa Toribio SQUIBB, MD;  Location: [facility];  Service: Endoscopy;;   CARDIAC CATHETERIZATION  2020   CHOLECYSTECTOMY N/A 07/21/2020   Procedure: LAPAROSCOPIC CHOLECYSTECTOMY WITH INTRAOPERATIVE CHOLANGIOGRAM;  Surgeon: Aron Shoulders, MD;  Location: [facility];  Service: General;  Laterality: N/A;   COLONOSCOPY  08/12/2017   Hx TA (piecemeal)Jacobs-MAC-suprep (good)   ESOPHAGOGASTRODUODENOSCOPY (EGD) WITH PROPOFOL  N/A 05/12/2020   Procedure:  ESOPHAGOGASTRODUODENOSCOPY (EGD) WITH PROPOFOL ;  Surgeon: Teressa Toribio SQUIBB, MD;  Location: [facility];  Service: Endoscopy;  Laterality: N/A;   EUS N/A 05/12/2020   Procedure: UPPER ENDOSCOPIC ULTRASOUND (EUS) RADIAL;  Surgeon: Teressa Toribio SQUIBB, MD;  Location: [facility];  Service: Endoscopy;  Laterality: N/A;   KNEE SURGERY Bilateral 1996   x 2 - arthroscopic   LEFT HEART CATH AND CORONARY ANGIOGRAPHY N/A 11/25/2018   Procedure: LEFT HEART CATH AND CORONARY ANGIOGRAPHY;  Surgeon: Burnard Debby LABOR, MD;  Location: [facility];  Service: Cardiovascular;  Laterality: N/A;   PELVIC LAPAROSCOPY  1989   W LYSIS OF ADHESIONS/L SALPINGONEOSTOMY   TUBAL LIGATION     WISDOM TOOTH EXTRACTION       Current Outpatient Medications:    albuterol  (VENTOLIN  HFA) 108 (90 Base) MCG/ACT inhaler, INHALE 2 PUFFS INTO THE LUNGS EVERY 6 HOURS AS NEEDED FOR WHEEZING OR SHORTNESS OF BREATH, Disp: 6.7 g, Rfl: 1   amLODipine  (NORVASC ) 10 MG tablet, Take 1 tablet (10 mg total) by mouth daily., Disp: 90 tablet, Rfl: 2   Ascorbic Acid  (VITAMIN C) 1000 MG tablet, Take 1,000 mg by mouth in the morning., Disp: , Rfl:    aspirin  EC 81 MG tablet, Take 1 tablet (81 mg total) by mouth daily. Swallow whole., Disp: 90 tablet, Rfl: 3   butalbital -acetaminophen -caffeine  (FIORICET ) 50-325-40 MG tablet, Take 1 tablet by mouth every 6 (six) hours as needed for headache., Disp: 20 tablet, Rfl: 0   Cholecalciferol  (VITAMIN D ) 50 MCG (2000 UT) tablet, Take 2,000 Units by mouth in the morning., Disp: , Rfl:    cloNIDine  (CATAPRES ) 0.1 MG tablet, TAKE 1 TABLET(0.1 MG) BY MOUTH TWICE DAILY, Disp: 90 tablet, Rfl: 3   ezetimibe  (ZETIA ) 10 MG tablet, TAKE 1 TABLET(10 MG) BY MOUTH DAILY, Disp: 90 tablet, Rfl: 2   hydrALAZINE  (APRESOLINE ) 100 MG tablet, TAKE 1 TABLET(100 MG) BY MOUTH THREE TIMES DAILY, Disp: 270 tablet, Rfl: 2   HYDROcodone -acetaminophen  (NORCO) 10-325 MG tablet, Take 1 tablet by mouth every 6 (six) hours as needed.,  Disp: , Rfl:    pantoprazole  (PROTONIX ) 40 MG tablet, TAKE 1 TABLET(40 MG) BY MOUTH DAILY, Disp: 90 tablet, Rfl: 3   pravastatin  (PRAVACHOL ) 40 MG tablet, Take 1 tablet (40 mg total) by mouth every evening., Disp: 90 tablet, Rfl: 1   promethazine  (PHENERGAN ) 25 MG tablet, Take 1 tablet (25 mg total) by mouth every 6 (six) hours as needed for nausea or vomiting., Disp: 15 tablet, Rfl: 0   spironolactone  (ALDACTONE ) 25 MG tablet, Take 25 mg by mouth daily., Disp: , Rfl:    valACYclovir  (VALTREX ) 500 MG tablet, TAKE 1 TABLET BY MOUTH TWICE DAILY FOR 3 TO 5 DAYS THEN TAKE DAILY AS NEEDED, Disp: 30 tablet, Rfl: 0   ALPRAZolam  (XANAX ) 0.5 MG tablet, Take 1 tablet (  0.5 mg total) by mouth 2 (two) times daily as needed for anxiety., Disp: 35 tablet, Rfl: 5   Potassium Chloride  ER 20 MEQ TBCR, Take 1 tablet (20 mEq total) by mouth in the morning and at bedtime., Disp: 180 tablet, Rfl: 0  Allergies  Allergen Reactions   Lidocaine  Hives   Zanaflex [Tizanidine] Anaphylaxis, Hives, Itching and Swelling   Cymbalta [Duloxetine Hcl] Itching and Other (See Comments)    Spaced out feeling   Lyrica [Pregabalin] Itching, Swelling and Other (See Comments)    Spaced out feeling   Neurontin [Gabapentin] Itching and Swelling   Latex Rash   ROS neg/noncontributory except as noted HPI/below      Objective:     BP 132/78 (BP Location: Left Arm, Patient Position: Sitting, Cuff Size: Normal)   Pulse (!) 50   Temp (!) 97.5 F (36.4 C) (Temporal)   Resp 18   Ht 5' 3 (1.6 m)   Wt 150 lb 2 oz (68.1 kg)   LMP  (LMP Unknown)   SpO2 99%   BMI 26.59 kg/m  Wt Readings from Last 3 Encounters:  07/07/24 150 lb 2 oz (68.1 kg)  07/06/24 151 lb 1.6 oz (68.5 kg)  06/24/24 150 lb 12.8 oz (68.4 kg)    Physical Exam   Gen: [facility] NAD HEENT: NCAT, conjunctiva not injected, sclera nonicteric NECK:  supple, no thyromegaly, no nodes, +R carotid bruits CARDIAC: RRR, S1S2+, +2/6 murmur. DP 1+B LUNGS: CTAB. No  wheezes ABDOMEN:  BS+, soft, NTND, No HSM, no masses EXT:  no edema MSK: no gross abnormalities. cane NEURO: A&O x3.  CN II-XII intact.  PSYCH: normal mood. Good eye contact     Assessment & Plan:  Essential hypertension  Chronic kidney disease, stage 3b (HCC)  Coronary artery disease involving native coronary artery of native heart without angina pectoris  Migraine without aura and without status migrainosus, not intractable  Mixed hyperlipidemia  Iron deficiency anemia due to chronic blood loss  Polyarthralgia  Anxiety  Other orders -     ALPRAZolam ; Take 1 tablet (0.5 mg total) by mouth 2 (two) times daily as needed for anxiety.  Dispense: 35 tablet; Refill: 5 -     Potassium Chloride  ER; Take 1 tablet (20 mEq total) by mouth in the morning and at bedtime.  Dispense: 180 tablet; Refill: 0  Assessment and Plan Assessment & Plan Chronic anemia   Chronic anemia presents with hemoglobin at 10.9 and slightly enlarged red blood cells. The differential diagnosis includes blood loss, decreased production, or increased destruction of red blood cells. The hematologist is monitoring and is not concerned about current hemoglobin levels. There is no evidence of rapid red blood cell turnover. No recent GI evaluation has been conducted since the previous GI doctor retired. Monitor hemoglobin levels and red blood cell size. Follow up with the hematologist in 6 months. Consider a GI evaluation if indicated.  Chronic kidney disease   Chronic kidney disease is under management with a nephrology follow-up scheduled for September 2nd. The nephrologist will send lab results to the primary care provider. Follow up with the nephrologist on September 2nd and request that lab results be sent to the primary care provider.  Carotid artery stenosis (50% blockage)   Carotid artery stenosis shows a 50% blockage. The vascular specialist recommends watchful waiting with follow-up in one year. A stent may be  considered if intervention becomes necessary. Follow up with the vascular specialist in one year.  Hypokalemia   Hypokalemia  persists despite potassium supplements twice daily. She is on spironolactone , which typically increases potassium levels. The nephrologist will check potassium levels during the upcoming visit. Discuss the large size of potassium pills and the need for a refill. Ensure the nephrologist checks potassium levels during the upcoming visit and refill the potassium prescription.  Hypertension   Hypertension is present with blood pressure typically in the 130s and occasional low heart rate. Current medications include clonidine , hydralazine , and spironolactone . Potential new treatments, including renal artery denervation, were discussed. Heart rate and blood pressure medications were reviewed with no changes planned. Continue the current antihypertensive regimen and discuss potential new treatments for hypertension with the cardiologist.  Hyperlipidemia   Hyperlipidemia is managed with Zetia  and pravastatin .  Gastroesophageal reflux disease   Gastroesophageal reflux disease is managed with Protonix .  Anxiety disorder   Anxiety disorder is managed with Xanax , used nightly and as needed, particularly for eye doctor visits. The use of controlled substances and coordination with the pain management doctor were discussed. The pain management doctor will take over the Fioricet  prescription. Refill the Xanax  prescription and coordinate with the pain management doctor regarding controlled substances.pt aware of risks w/all these meds    Return in about 6 months (around 01/07/2025) for mood.  Jenkins CHRISTELLA Carrel, MD

## 2024-07-08 ENCOUNTER — Ambulatory Visit: Payer: Self-pay | Admitting: Internal Medicine

## 2024-07-09 ENCOUNTER — Other Ambulatory Visit: Payer: Self-pay | Admitting: Cardiology

## 2024-07-09 DIAGNOSIS — M961 Postlaminectomy syndrome, not elsewhere classified: Secondary | ICD-10-CM | POA: Diagnosis not present

## 2024-07-09 DIAGNOSIS — M25551 Pain in right hip: Secondary | ICD-10-CM | POA: Diagnosis not present

## 2024-07-09 DIAGNOSIS — G5701 Lesion of sciatic nerve, right lower limb: Secondary | ICD-10-CM | POA: Diagnosis not present

## 2024-07-09 DIAGNOSIS — G894 Chronic pain syndrome: Secondary | ICD-10-CM | POA: Diagnosis not present

## 2024-07-14 ENCOUNTER — Other Ambulatory Visit: Payer: Self-pay | Admitting: Family Medicine

## 2024-07-20 ENCOUNTER — Ambulatory Visit: Payer: PPO

## 2024-07-29 NOTE — Progress Notes (Signed)
 Remote Loop Recorder Transmission

## 2024-08-06 ENCOUNTER — Ambulatory Visit

## 2024-08-06 DIAGNOSIS — I639 Cerebral infarction, unspecified: Secondary | ICD-10-CM | POA: Diagnosis not present

## 2024-08-06 LAB — CUP PACEART REMOTE DEVICE CHECK
Date Time Interrogation Session: 20250924232617
Implantable Pulse Generator Implant Date: 20231117

## 2024-08-07 DIAGNOSIS — H353211 Exudative age-related macular degeneration, right eye, with active choroidal neovascularization: Secondary | ICD-10-CM | POA: Diagnosis not present

## 2024-08-07 DIAGNOSIS — H31092 Other chorioretinal scars, left eye: Secondary | ICD-10-CM | POA: Diagnosis not present

## 2024-08-07 DIAGNOSIS — H2513 Age-related nuclear cataract, bilateral: Secondary | ICD-10-CM | POA: Diagnosis not present

## 2024-08-07 DIAGNOSIS — H353123 Nonexudative age-related macular degeneration, left eye, advanced atrophic without subfoveal involvement: Secondary | ICD-10-CM | POA: Diagnosis not present

## 2024-08-07 DIAGNOSIS — H35033 Hypertensive retinopathy, bilateral: Secondary | ICD-10-CM | POA: Diagnosis not present

## 2024-08-07 DIAGNOSIS — H43813 Vitreous degeneration, bilateral: Secondary | ICD-10-CM | POA: Diagnosis not present

## 2024-08-09 ENCOUNTER — Ambulatory Visit: Payer: Self-pay | Admitting: Internal Medicine

## 2024-08-10 NOTE — Progress Notes (Signed)
 Remote Loop Recorder Transmission

## 2024-08-13 DIAGNOSIS — H5213 Myopia, bilateral: Secondary | ICD-10-CM | POA: Diagnosis not present

## 2024-08-13 DIAGNOSIS — H25013 Cortical age-related cataract, bilateral: Secondary | ICD-10-CM | POA: Diagnosis not present

## 2024-08-13 DIAGNOSIS — H04123 Dry eye syndrome of bilateral lacrimal glands: Secondary | ICD-10-CM | POA: Diagnosis not present

## 2024-08-13 DIAGNOSIS — H25041 Posterior subcapsular polar age-related cataract, right eye: Secondary | ICD-10-CM | POA: Diagnosis not present

## 2024-08-13 DIAGNOSIS — H52203 Unspecified astigmatism, bilateral: Secondary | ICD-10-CM | POA: Diagnosis not present

## 2024-08-13 DIAGNOSIS — H5340 Unspecified visual field defects: Secondary | ICD-10-CM | POA: Diagnosis not present

## 2024-08-13 DIAGNOSIS — H353231 Exudative age-related macular degeneration, bilateral, with active choroidal neovascularization: Secondary | ICD-10-CM | POA: Diagnosis not present

## 2024-08-13 DIAGNOSIS — H2513 Age-related nuclear cataract, bilateral: Secondary | ICD-10-CM | POA: Diagnosis not present

## 2024-08-13 DIAGNOSIS — H524 Presbyopia: Secondary | ICD-10-CM | POA: Diagnosis not present

## 2024-08-13 NOTE — Progress Notes (Signed)
 Remote Loop Recorder Transmission

## 2024-08-16 ENCOUNTER — Other Ambulatory Visit: Payer: Self-pay | Admitting: Cardiology

## 2024-08-18 ENCOUNTER — Other Ambulatory Visit: Payer: Self-pay | Admitting: Cardiology

## 2024-08-18 NOTE — Telephone Encounter (Signed)
 Patient need make an appointment for further refills.  Thanks

## 2024-08-24 ENCOUNTER — Ambulatory Visit: Payer: PPO

## 2024-08-31 DIAGNOSIS — Z961 Presence of intraocular lens: Secondary | ICD-10-CM | POA: Diagnosis not present

## 2024-08-31 DIAGNOSIS — H2511 Age-related nuclear cataract, right eye: Secondary | ICD-10-CM | POA: Diagnosis not present

## 2024-08-31 DIAGNOSIS — H25041 Posterior subcapsular polar age-related cataract, right eye: Secondary | ICD-10-CM | POA: Diagnosis not present

## 2024-08-31 DIAGNOSIS — H25011 Cortical age-related cataract, right eye: Secondary | ICD-10-CM | POA: Diagnosis not present

## 2024-08-31 DIAGNOSIS — H25811 Combined forms of age-related cataract, right eye: Secondary | ICD-10-CM | POA: Diagnosis not present

## 2024-09-03 DIAGNOSIS — Z79891 Long term (current) use of opiate analgesic: Secondary | ICD-10-CM | POA: Diagnosis not present

## 2024-09-03 DIAGNOSIS — M25551 Pain in right hip: Secondary | ICD-10-CM | POA: Diagnosis not present

## 2024-09-03 DIAGNOSIS — G894 Chronic pain syndrome: Secondary | ICD-10-CM | POA: Diagnosis not present

## 2024-09-03 DIAGNOSIS — G5701 Lesion of sciatic nerve, right lower limb: Secondary | ICD-10-CM | POA: Diagnosis not present

## 2024-09-03 DIAGNOSIS — M961 Postlaminectomy syndrome, not elsewhere classified: Secondary | ICD-10-CM | POA: Diagnosis not present

## 2024-09-07 ENCOUNTER — Ambulatory Visit (INDEPENDENT_AMBULATORY_CARE_PROVIDER_SITE_OTHER)

## 2024-09-07 DIAGNOSIS — N1832 Chronic kidney disease, stage 3b: Secondary | ICD-10-CM | POA: Diagnosis not present

## 2024-09-07 DIAGNOSIS — N261 Atrophy of kidney (terminal): Secondary | ICD-10-CM | POA: Diagnosis not present

## 2024-09-07 DIAGNOSIS — I2510 Atherosclerotic heart disease of native coronary artery without angina pectoris: Secondary | ICD-10-CM | POA: Diagnosis not present

## 2024-09-07 DIAGNOSIS — D509 Iron deficiency anemia, unspecified: Secondary | ICD-10-CM | POA: Diagnosis not present

## 2024-09-07 DIAGNOSIS — I129 Hypertensive chronic kidney disease with stage 1 through stage 4 chronic kidney disease, or unspecified chronic kidney disease: Secondary | ICD-10-CM | POA: Diagnosis not present

## 2024-09-07 DIAGNOSIS — I714 Abdominal aortic aneurysm, without rupture, unspecified: Secondary | ICD-10-CM | POA: Diagnosis not present

## 2024-09-07 DIAGNOSIS — R700 Elevated erythrocyte sedimentation rate: Secondary | ICD-10-CM | POA: Diagnosis not present

## 2024-09-07 DIAGNOSIS — I639 Cerebral infarction, unspecified: Secondary | ICD-10-CM | POA: Diagnosis not present

## 2024-09-07 DIAGNOSIS — H3530 Unspecified macular degeneration: Secondary | ICD-10-CM | POA: Diagnosis not present

## 2024-09-07 DIAGNOSIS — R809 Proteinuria, unspecified: Secondary | ICD-10-CM | POA: Diagnosis not present

## 2024-09-07 LAB — CUP PACEART REMOTE DEVICE CHECK
Date Time Interrogation Session: 20251026232607
Implantable Pulse Generator Implant Date: 20231117

## 2024-09-10 ENCOUNTER — Ambulatory Visit: Payer: Self-pay | Admitting: Internal Medicine

## 2024-09-10 NOTE — Progress Notes (Signed)
 Remote Loop Recorder Transmission

## 2024-09-16 ENCOUNTER — Other Ambulatory Visit: Payer: Self-pay | Admitting: Cardiology

## 2024-09-28 ENCOUNTER — Ambulatory Visit: Payer: PPO

## 2024-10-05 DIAGNOSIS — H25812 Combined forms of age-related cataract, left eye: Secondary | ICD-10-CM | POA: Diagnosis not present

## 2024-10-05 DIAGNOSIS — H25012 Cortical age-related cataract, left eye: Secondary | ICD-10-CM | POA: Diagnosis not present

## 2024-10-05 DIAGNOSIS — I2510 Atherosclerotic heart disease of native coronary artery without angina pectoris: Secondary | ICD-10-CM | POA: Diagnosis not present

## 2024-10-05 DIAGNOSIS — Z961 Presence of intraocular lens: Secondary | ICD-10-CM | POA: Diagnosis not present

## 2024-10-05 DIAGNOSIS — I252 Old myocardial infarction: Secondary | ICD-10-CM | POA: Diagnosis not present

## 2024-10-05 DIAGNOSIS — H2512 Age-related nuclear cataract, left eye: Secondary | ICD-10-CM | POA: Diagnosis not present

## 2024-10-08 ENCOUNTER — Ambulatory Visit

## 2024-10-10 ENCOUNTER — Ambulatory Visit

## 2024-10-10 DIAGNOSIS — I639 Cerebral infarction, unspecified: Secondary | ICD-10-CM | POA: Diagnosis not present

## 2024-10-12 LAB — CUP PACEART REMOTE DEVICE CHECK
Date Time Interrogation Session: 20251128231445
Implantable Pulse Generator Implant Date: 20231117

## 2024-10-14 NOTE — Progress Notes (Signed)
 Remote Loop Recorder Transmission

## 2024-10-15 ENCOUNTER — Ambulatory Visit: Payer: Self-pay | Admitting: Internal Medicine

## 2024-10-15 ENCOUNTER — Other Ambulatory Visit: Payer: Self-pay | Admitting: Cardiology

## 2024-10-15 ENCOUNTER — Other Ambulatory Visit (HOSPITAL_COMMUNITY): Payer: Self-pay

## 2024-10-15 ENCOUNTER — Telehealth: Payer: Self-pay | Admitting: Cardiology

## 2024-10-15 DIAGNOSIS — I10 Essential (primary) hypertension: Secondary | ICD-10-CM

## 2024-10-15 MED ORDER — AMLODIPINE BESYLATE 10 MG PO TABS
10.0000 mg | ORAL_TABLET | Freq: Every day | ORAL | 0 refills | Status: DC
  Filled 2024-10-15: qty 65, 65d supply, fill #0

## 2024-10-15 NOTE — Telephone Encounter (Signed)
*  STAT* If patient is at the pharmacy, call can be transferred to refill team.   1. Which medications need to be refilled? (please list name of each medication and dose if known)   amLODipine  (NORVASC ) 10 MG tablet    ezetimibe  (ZETIA ) 10 MG tablet    pantoprazole  (PROTONIX ) 40 MG tablet    2. Would you like to learn more about the convenience, safety, & potential cost savings by using the [facility]? no     3. Are you open to using the [facility] (Type [facility]. no ).   4. Which pharmacy/location (including street and city if local pharmacy) is medication to be sent to? [id number] - [place], [place] - [address] Phone: [phone]  Fax: [phone]       5. Do they need a 30 day or 90 day supply? Pt made an appt for  12/18/2024 please refills til then Pt is out

## 2024-10-15 NOTE — Telephone Encounter (Signed)
 Viewing pt's chart.. Refills have been sent for Protonix  and Zetia  in 06/2024 for 1 year.   Will send in Amlodipine  until pt's appointment.

## 2024-10-16 DIAGNOSIS — H35033 Hypertensive retinopathy, bilateral: Secondary | ICD-10-CM | POA: Diagnosis not present

## 2024-10-16 DIAGNOSIS — H43813 Vitreous degeneration, bilateral: Secondary | ICD-10-CM | POA: Diagnosis not present

## 2024-10-16 DIAGNOSIS — H2513 Age-related nuclear cataract, bilateral: Secondary | ICD-10-CM | POA: Diagnosis not present

## 2024-10-16 DIAGNOSIS — H31092 Other chorioretinal scars, left eye: Secondary | ICD-10-CM | POA: Diagnosis not present

## 2024-10-16 DIAGNOSIS — H353123 Nonexudative age-related macular degeneration, left eye, advanced atrophic without subfoveal involvement: Secondary | ICD-10-CM | POA: Diagnosis not present

## 2024-10-16 DIAGNOSIS — H353211 Exudative age-related macular degeneration, right eye, with active choroidal neovascularization: Secondary | ICD-10-CM | POA: Diagnosis not present

## 2024-10-27 ENCOUNTER — Other Ambulatory Visit: Payer: Self-pay | Admitting: Cardiology

## 2024-10-27 DIAGNOSIS — Z79899 Other long term (current) drug therapy: Secondary | ICD-10-CM

## 2024-10-27 DIAGNOSIS — I10 Essential (primary) hypertension: Secondary | ICD-10-CM

## 2024-10-27 DIAGNOSIS — I2510 Atherosclerotic heart disease of native coronary artery without angina pectoris: Secondary | ICD-10-CM

## 2024-10-27 DIAGNOSIS — E782 Mixed hyperlipidemia: Secondary | ICD-10-CM

## 2024-10-27 DIAGNOSIS — E785 Hyperlipidemia, unspecified: Secondary | ICD-10-CM

## 2024-10-28 MED ORDER — PRAVASTATIN SODIUM 40 MG PO TABS: 40.0000 mg | ORAL_TABLET | Freq: Every evening | ORAL | 1 refills | Status: DC

## 2024-10-30 ENCOUNTER — Other Ambulatory Visit: Payer: Self-pay | Admitting: Cardiology

## 2024-11-02 ENCOUNTER — Ambulatory Visit: Payer: PPO

## 2024-11-02 ENCOUNTER — Other Ambulatory Visit: Payer: Self-pay | Admitting: Family Medicine

## 2024-11-03 ENCOUNTER — Ambulatory Visit: Payer: PPO

## 2024-11-03 ENCOUNTER — Telehealth: Payer: Self-pay | Admitting: Cardiology

## 2024-11-03 NOTE — Telephone Encounter (Signed)
" °*  STAT* If patient is at the pharmacy, call can be transferred to refill team.   1. Which medications need to be refilled? (please list name of each medication and dose if known) amLODipine  (NORVASC ) 10 MG tablet    2. Would you like to learn more about the convenience, safety, & potential cost savings by using the [facility]?    3. Are you open to using the [facility] (Type [facility].  ).   4. Which pharmacy/location (including street and city if local pharmacy) is medication to be sent to? [id number] - [place], [place] - [address]    5. Do they need a 30 day or 90 day supply? 90 day  "

## 2024-11-09 ENCOUNTER — Ambulatory Visit

## 2024-11-09 ENCOUNTER — Other Ambulatory Visit: Payer: Self-pay

## 2024-11-09 DIAGNOSIS — I719 Aortic aneurysm of unspecified site, without rupture: Secondary | ICD-10-CM

## 2024-11-10 ENCOUNTER — Ambulatory Visit

## 2024-11-10 ENCOUNTER — Other Ambulatory Visit: Payer: Self-pay | Admitting: Cardiology

## 2024-11-10 DIAGNOSIS — I639 Cerebral infarction, unspecified: Secondary | ICD-10-CM

## 2024-11-10 MED ORDER — AMLODIPINE BESYLATE 10 MG PO TABS: 10.0000 mg | ORAL_TABLET | Freq: Every day | ORAL | 1 refills | Status: DC

## 2024-11-11 LAB — CUP PACEART REMOTE DEVICE CHECK
Date Time Interrogation Session: 20251229231958
Implantable Pulse Generator Implant Date: 20231117

## 2024-11-11 NOTE — Progress Notes (Signed)
 Remote Loop Recorder Transmission

## 2024-11-13 ENCOUNTER — Other Ambulatory Visit: Payer: Self-pay | Admitting: Family Medicine

## 2024-11-13 NOTE — Telephone Encounter (Unsigned)
 Copied from CRM [id number]. Topic: Clinical - Medication Refill >> Nov 13, 2024  4:00 PM Chasity T wrote: Medication: Potassium Chloride  ER 20 MEQ TBCR (she states she only needs 30, kidney dr told her potassium was to high)   Has the patient contacted their pharmacy? Yes   This is the patient's preferred pharmacy:  [id number] - [place], [place] - [address] [place] [address] Phone: [phone] Fax: [phone]    Is this the correct pharmacy for this prescription? Yes If no, delete pharmacy and type the correct one.   Has the prescription been filled recently? No  Is the patient out of the medication? Yes  Has the patient been seen for an appointment in the last year OR does the patient have an upcoming appointment? Yes  Can we respond through MyChart? Yes  Agent: Please be advised that Rx refills may take up to 3 business days. We ask that you follow-up with your pharmacy.

## 2024-11-14 ENCOUNTER — Ambulatory Visit: Payer: Self-pay | Admitting: Cardiology

## 2024-11-16 ENCOUNTER — Other Ambulatory Visit: Payer: Self-pay

## 2024-11-16 MED ORDER — POTASSIUM CHLORIDE ER 20 MEQ PO TBCR: 20.0000 meq | EXTENDED_RELEASE_TABLET | Freq: Two times a day (BID) | ORAL | 0 refills | Status: AC

## 2024-11-19 ENCOUNTER — Encounter (HOSPITAL_COMMUNITY): Payer: Self-pay

## 2024-11-19 ENCOUNTER — Ambulatory Visit (HOSPITAL_COMMUNITY)
Admission: RE | Admit: 2024-11-19 | Discharge: 2024-11-19 | Disposition: A | Discharge status: Home / Self Care | Source: Ambulatory Visit | Attending: Vascular Surgery | Admitting: Vascular Surgery

## 2024-11-19 DIAGNOSIS — I719 Aortic aneurysm of unspecified site, without rupture: Secondary | ICD-10-CM | POA: Insufficient documentation

## 2024-11-19 DIAGNOSIS — N1832 Chronic kidney disease, stage 3b: Secondary | ICD-10-CM | POA: Insufficient documentation

## 2024-11-19 LAB — POCT I-STAT CREATININE: Creatinine, Ser: 2.4 mg/dL — ABNORMAL HIGH (ref 0.44–1.00)

## 2024-11-23 ENCOUNTER — Other Ambulatory Visit: Payer: Self-pay

## 2024-11-23 ENCOUNTER — Other Ambulatory Visit: Payer: Self-pay | Admitting: Family Medicine

## 2024-11-23 DIAGNOSIS — I719 Aortic aneurysm of unspecified site, without rupture: Secondary | ICD-10-CM

## 2024-11-23 NOTE — Telephone Encounter (Signed)
 Only has one pill left. Also wants to see if you can review her creatine level?  Last OV: 07/07/2025  Next OV: 11/30/2024  Last Refill: 11/02/2024  Dispense: 35/0

## 2024-11-27 ENCOUNTER — Ambulatory Visit (HOSPITAL_COMMUNITY)
Admission: RE | Admit: 2024-11-27 | Discharge: 2024-11-27 | Disposition: A | Discharge status: Home / Self Care | Source: Ambulatory Visit | Attending: Cardiology | Admitting: Cardiology

## 2024-11-27 DIAGNOSIS — K5730 Diverticulosis of large intestine without perforation or abscess without bleeding: Secondary | ICD-10-CM | POA: Insufficient documentation

## 2024-11-27 DIAGNOSIS — I700 Atherosclerosis of aorta: Secondary | ICD-10-CM | POA: Diagnosis not present

## 2024-11-27 DIAGNOSIS — I7143 Infrarenal abdominal aortic aneurysm, without rupture: Secondary | ICD-10-CM | POA: Insufficient documentation

## 2024-11-27 DIAGNOSIS — I719 Aortic aneurysm of unspecified site, without rupture: Secondary | ICD-10-CM

## 2024-11-27 DIAGNOSIS — I7122 Aneurysm of the aortic arch, without rupture: Secondary | ICD-10-CM | POA: Diagnosis not present

## 2024-11-30 ENCOUNTER — Ambulatory Visit

## 2024-11-30 VITALS — BP 122/78 | HR 68 | Ht 62.5 in | Wt 147.0 lb

## 2024-11-30 DIAGNOSIS — Z0000 Encounter for general adult medical examination without abnormal findings: Secondary | ICD-10-CM | POA: Diagnosis not present

## 2024-11-30 DIAGNOSIS — Z1231 Encounter for screening mammogram for malignant neoplasm of breast: Secondary | ICD-10-CM

## 2024-11-30 NOTE — Progress Notes (Signed)
 "  Chief Complaint  Patient presents with   Medicare Wellness     Subjective:   Robin Arellano is a 74 y.o. female who presents for a [employer] Visit.  Visit info / Clinical Intake: Medicare Wellness Visit Type:: Subsequent Annual Wellness Visit Persons participating in visit and providing information:: patient Medicare Wellness Visit Mode:: Telephone If telephone:: video declined Since this visit was completed virtually, some vitals may be partially provided or unavailable. Missing vitals are due to the limitations of the virtual format.: Documented vitals are patient reported If Telephone or Video please confirm:: I connected with patient using audio/video enable telemedicine. I verified patient identity with two identifiers, discussed telehealth limitations, and patient agreed to proceed. Patient Location:: home Provider Location:: office Interpreter Needed?: No Pre-visit prep was completed: yes AWV questionnaire completed by patient prior to visit?: no Living arrangements:: lives with spouse/significant other Patient's Overall Health Status Rating: (!) fair Typical amount of pain: (!) a lot Does pain affect daily life?: (!) yes Are you currently prescribed opioids?: (!) yes  Dietary Habits and Nutritional Risks How many meals a day?: 3 Eats fruit and vegetables daily?: yes Most meals are obtained by: preparing own meals In the last 2 weeks, have you had any of the following?: (!) nausea, vomiting, diarrhea Diabetic:: no  Functional Status Activities of Daily Living (to include ambulation/medication): Independent Ambulation: Independent with device- listed below Home Assistive Devices/Equipment: [place]; Eyeglasses Medication Administration: Independent Home Management (perform basic housework or laundry): Independent Manage your own finances?: yes Primary transportation is: family / friends Concerns about vision?: (!) yes (macular  degeneration) Concerns about hearing?: no  Fall Screening Falls in the past year?: 0 Number of falls in past year: 0 Was there an injury with Fall?: 0 Fall Risk Category Calculator: 0 Patient Fall Risk Level: Low Fall Risk  Fall Risk Patient at Risk for Falls Due to: Impaired balance/gait; Impaired mobility Fall risk Follow up: Falls evaluation completed  Home and Transportation Safety: All rugs have non-skid backing?: yes All stairs or steps have railings?: yes Grab bars in the bathtub or shower?: yes Have non-skid surface in bathtub or shower?: yes Good home lighting?: yes Regular seat belt use?: yes Hospital stays in the last year:: no  Cognitive Assessment Difficulty concentrating, remembering, or making decisions? : no Will 6CIT or Mini Cog be Completed: yes What year is it?: 0 points What month is it?: 0 points Give patient an address phrase to remember (5 components): [address] [place]  About what time is it?: 0 points Count backwards from 20 to 1: 0 points Say the months of the year in reverse: 0 points Repeat the address phrase from earlier: 0 points 6 CIT Score: 0 points  Advance Directives (For Healthcare) Does Patient Have a Medical Advance Directive?: Yes Does patient want to make changes to medical advance directive?: No - Patient declined Type of Advance Directive: Healthcare Power of Attorney Copy of Healthcare Power of Attorney in Chart?: No - copy requested  Reviewed/Updated  Reviewed/Updated: Reviewed All (Medical, Surgical, Family, Medications, Allergies, Care Teams, Patient Goals)    Allergies (verified) Lidocaine , Zanaflex [tizanidine], Cymbalta [duloxetine hcl], Lyrica [pregabalin], Neurontin [gabapentin], and Latex   Current Medications (verified) Outpatient Encounter Medications as of 11/30/2024  Medication Sig   albuterol  (VENTOLIN  HFA) 108 (90 Base) MCG/ACT inhaler INHALE 2 PUFFS INTO THE LUNGS EVERY 6 HOURS AS NEEDED FOR WHEEZING OR  SHORTNESS OF BREATH   ALPRAZolam  (XANAX ) 0.5 MG tablet TAKE  1 TABLET(0.5 MG) BY MOUTH TWICE DAILY AS NEEDED FOR ANXIETY   amLODipine  (NORVASC ) 10 MG tablet Take 1 tablet (10 mg total) by mouth daily.   Ascorbic Acid  (VITAMIN C) 1000 MG tablet Take 1,000 mg by mouth in the morning.   aspirin  EC 81 MG tablet Take 1 tablet (81 mg total) by mouth daily. Swallow whole.   butalbital -acetaminophen -caffeine  (FIORICET ) 50-325-40 MG tablet Take 1 tablet by mouth every 6 (six) hours as needed for headache.   Cholecalciferol  (VITAMIN D ) 50 MCG (2000 UT) tablet Take 2,000 Units by mouth in the morning.   cloNIDine  (CATAPRES ) 0.1 MG tablet TAKE 1 TABLET(0.1 MG) BY MOUTH TWICE DAILY   ezetimibe  (ZETIA ) 10 MG tablet TAKE 1 TABLET(10 MG) BY MOUTH DAILY   hydrALAZINE  (APRESOLINE ) 100 MG tablet Take 1 tablet (100 mg total) by mouth 3 (three) times daily. Pt must keep upcoming followup appt with Cardiology in February 2026 for any more refills. Thank You   HYDROcodone -acetaminophen  (NORCO) 10-325 MG tablet Take 1 tablet by mouth every 6 (six) hours as needed.   pantoprazole  (PROTONIX ) 40 MG tablet TAKE 1 TABLET(40 MG) BY MOUTH DAILY   Potassium Chloride  ER 20 MEQ TBCR Take 1 tablet (20 mEq total) by mouth in the morning and at bedtime. (Patient taking differently: Take 20 mEq by mouth every other day.)   pravastatin  (PRAVACHOL ) 40 MG tablet Take 1 tablet (40 mg total) by mouth every evening.   promethazine  (PHENERGAN ) 25 MG tablet Take 1 tablet (25 mg total) by mouth every 6 (six) hours as needed for nausea or vomiting.   spironolactone  (ALDACTONE ) 25 MG tablet Take 25 mg by mouth daily.   valACYclovir  (VALTREX ) 500 MG tablet TAKE 1 TABLET BY MOUTH TWICE DAILY FOR 3 TO 5 DAYS THEN TAKE DAILY AS NEEDED   No facility-administered encounter medications on file as of 11/30/2024.    History: Past Medical History:  Diagnosis Date   Allergy    Anemia    Anxiety    on meds   Back pain    Blood transfusion without  reported diagnosis    Cataract    Chronic female pelvic pain    Chronic kidney disease    Coronary artery disease    mild, non-obstructive 11/2018   Depression    on meds   Family history of adverse reaction to anesthesia    sister had difficulty waking up   Fibromyalgia    H/O leukocytosis    Headache    Heart murmur    Hyperlipidemia    on meds   Hypertension    on meds   Macular degeneration of both eyes    wet R, dry L   MI (myocardial infarction) (HCC)    Pt states she did not have a MI- EKG was normal, was GERD   Osteoarthritis    on meds   Ovarian cyst, right    PONV (postoperative nausea and vomiting)    Post-operative nausea and vomiting    Stroke (HCC) 05/14/2022   L PCA   SVD (spontaneous vaginal delivery)    x 2   Vitamin D  deficiency    Past Surgical History:  Procedure Laterality Date   ABDOMINAL HYSTERECTOMY  1994   TAH.BSO   ANTERIOR CERVICAL DECOMP/DISCECTOMY FUSION  2019   APPENDECTOMY  1975   BACK SURGERY  2023   BIOPSY  05/12/2020   Procedure: BIOPSY;  Surgeon: Teressa Toribio SQUIBB, MD;  Location: [facility];  Service: Endoscopy;;   CARDIAC  CATHETERIZATION  2020   CHOLECYSTECTOMY N/A 07/21/2020   Procedure: LAPAROSCOPIC CHOLECYSTECTOMY WITH INTRAOPERATIVE CHOLANGIOGRAM;  Surgeon: Aron Shoulders, MD;  Location: [facility] OR;  Service: General;  Laterality: N/A;   COLONOSCOPY  08/12/2017   Hx TA (piecemeal)Jacobs-MAC-suprep (good)   ESOPHAGOGASTRODUODENOSCOPY (EGD) WITH PROPOFOL  N/A 05/12/2020   Procedure: ESOPHAGOGASTRODUODENOSCOPY (EGD) WITH PROPOFOL ;  Surgeon: Teressa Toribio SQUIBB, MD;  Location: [facility];  Service: Endoscopy;  Laterality: N/A;   EUS N/A 05/12/2020   Procedure: UPPER ENDOSCOPIC ULTRASOUND (EUS) RADIAL;  Surgeon: Teressa Toribio SQUIBB, MD;  Location: [facility];  Service: Endoscopy;  Laterality: N/A;   KNEE SURGERY Bilateral 1996   x 2 - arthroscopic   LEFT HEART CATH AND CORONARY ANGIOGRAPHY N/A 11/25/2018   Procedure: LEFT HEART CATH AND  CORONARY ANGIOGRAPHY;  Surgeon: Burnard Debby LABOR, MD;  Location: [facility];  Service: Cardiovascular;  Laterality: N/A;   PELVIC LAPAROSCOPY  1989   W LYSIS OF ADHESIONS/L SALPINGONEOSTOMY   TUBAL LIGATION     WISDOM TOOTH EXTRACTION     Family History  Problem Relation Age of Onset   Miscarriages / Stillbirths Mother    Early death Mother    Cancer Mother    Uterine cancer Mother 54   Heart attack Mother    Arthritis Father    Cancer Father    Aneurysm Father    Other Sister        MGUS    Other Sister        MA   COPD Brother    Skin cancer Brother    Skin cancer Brother    Heart disease Paternal Grandfather    Colon cancer Neg Hx    Rectal cancer Neg Hx    Stomach cancer Neg Hx    Stroke Neg Hx    Neuropathy Neg Hx    Colon polyps Neg Hx    Esophageal cancer Neg Hx    Social History   Occupational History   Occupation: Veterinary Surgeon  Tobacco Use   Smoking status: Former    Current packs/day: 0.00    Average packs/day: 0.2 packs/day for 20.0 years (3.0 ttl pk-yrs)    Types: Cigarettes    Start date: 11/12/1978    Quit date: 11/12/1998    Years since quitting: 26.0   Smokeless tobacco: Never  Vaping Use   Vaping status: Never Used  Substance and Sexual Activity   Alcohol use: Not Currently   Drug use: No   Sexual activity: Not Currently    Birth control/protection: Post-menopausal, Surgical    Comment: HYSTERECTOMY   Tobacco Counseling Counseling given: Not Answered  SDOH Screenings   Food Insecurity: No Food Insecurity (11/30/2024)  Housing: Low Risk (11/30/2024)  Transportation Needs: No Transportation Needs (11/30/2024)  Utilities: Not At Risk (11/30/2024)  Alcohol Screen: Low Risk (11/30/2024)  Depression (PHQ2-9): Low Risk (11/30/2024)  Financial Resource Strain: Low Risk (07/06/2024)  Physical Activity: Insufficiently Active (11/30/2024)  Social Connections: Moderately Isolated (11/30/2024)  Stress: No Stress Concern Present (11/30/2024)  Tobacco Use:  Medium Risk (11/30/2024)  Health Literacy: Adequate Health Literacy (11/30/2024)   See flowsheets for full screening details  Depression Screen PHQ 2 & 9 Depression Scale- Over the past 2 weeks, how often have you been bothered by any of the following problems? Little interest or pleasure in doing things: 0 Feeling down, depressed, or hopeless (PHQ Adolescent also includes...irritable): 0 PHQ-2 Total Score: 0     Goals Addressed  This Visit's Progress     walk without cane (pt-stated)        With without cane              Objective:    Today's Vitals   11/30/24 1529  BP: 122/78  Pulse: 68  Weight: 147 lb (66.7 kg)  Height: 5' 2.5 (1.588 m)   Body mass index is 26.46 kg/m.  Hearing/Vision screen Hearing Screening - Comments:: Pt denies any hearing issues  Vision Screening - Comments:: Wears rx glasses - up to date with routine eye exams with Dr Elspeth &amp; Dr Nonah for injection  Immunizations and Health Maintenance Health Maintenance  Topic Date Due   DTaP/Tdap/Td (1 - Tdap) Never done   Pneumococcal Vaccine: 50+ Years (1 of 2 - PCV) Never done   Zoster Vaccines- Shingrix (1 of 2) Never done   COVID-19 Vaccine (3 - Pfizer risk series) 03/14/2020   Mammogram  12/22/2022   Influenza Vaccine  02/09/2025 (Originally 06/12/2024)   Medicare Annual Wellness (AWV)  11/30/2025   Colonoscopy  03/29/2026   Bone Density Scan  Completed   Hepatitis C Screening  Completed   Meningococcal B Vaccine  Aged Out        Assessment/Plan:  This is a routine wellness examination for Annalicia.  Patient Care Team: Wendolyn Jenkins Jansky, MD as PCP - General (Family Medicine) Lonni Slain, MD as PCP - Cardiology (Cardiology) Cecilie Leita NOVAK, CCC-SLP as CIR Admissions Coordinator (Speech Pathology)  I have personally reviewed and noted the following in the patients chart:   Medical and social history Use of alcohol, tobacco or illicit drugs  Current  medications and supplements including opioid prescriptions. Functional ability and status Nutritional status Physical activity Advanced directives List of other physicians Hospitalizations, surgeries, and ER visits in previous 12 months Vitals Screenings to include cognitive, depression, and falls Referrals and appointments  Orders Placed This Encounter  Procedures   MM Digital Screening Breast Bilateral    Standing Status:   Future    Expiration Date:   02/28/2025    Reason for Exam (SYMPTOM  OR DIAGNOSIS REQUIRED):   Breast cancer screening    Preferred imaging location?:   [facility]   In addition, I have reviewed and discussed with patient certain preventive protocols, quality metrics, and best practice recommendations. A written personalized care plan for preventive services as well as general preventive health recommendations were provided to patient.   Ellouise VEAR Haws, LPN   8/80/7973   Return in 1 year (on 11/30/2025).  After Visit Summary: (MyChart) Due to this being a telephonic visit, the after visit summary with patients personalized plan was offered to patient via MyChart   Nurse Notes: HM Addressed: Vaccines Due: and discussed  Mammogram ordered  "

## 2024-11-30 NOTE — Patient Instructions (Signed)
 Ms. Barman,  Thank you for taking the time for your Medicare Wellness Visit. I appreciate your continued commitment to your health goals. Please review the care plan we discussed, and feel free to reach out if I can assist you further.  Please note that Annual Wellness Visits do not include a physical exam. Some assessments may be limited, especially if the visit was conducted virtually. If needed, we may recommend an in-person follow-up with your provider.  Ongoing Care Seeing your primary care provider every 3 to 6 months helps us  monitor your health and provide consistent, personalized care.   Referrals If a referral was made during today's visit and you haven't received any updates within two weeks, please contact the referred provider directly to check on the status.  Recommended Screenings:  Health Maintenance  Topic Date Due   DTaP/Tdap/Td vaccine (1 - Tdap) Never done   Pneumococcal Vaccine for age over 68 (1 of 2 - PCV) Never done   Zoster (Shingles) Vaccine (1 of 2) Never done   COVID-19 Vaccine (3 - Pfizer risk series) 03/14/2020   Breast Cancer Screening  12/22/2022   Flu Shot  Never done   Medicare Annual Wellness Visit  11/30/2025   Colon Cancer Screening  03/29/2026   Osteoporosis screening with Bone Density Scan  Completed   Hepatitis C Screening  Completed   Meningitis B Vaccine  Aged Out       11/30/2024    3:34 PM  Advanced Directives  Does Patient Have a Medical Advance Directive? Yes  Type of Advance Directive Healthcare Power of Attorney  Copy of Healthcare Power of Attorney in Chart? No - copy requested    Vision: Annual vision screenings are recommended for early detection of glaucoma, cataracts, and diabetic retinopathy. These exams can also reveal signs of chronic conditions such as diabetes and high blood pressure.  Dental: Annual dental screenings help detect early signs of oral cancer, gum disease, and other conditions linked to overall health,  including heart disease and diabetes.  Please see the attached documents for additional preventive care recommendations.

## 2024-12-04 ENCOUNTER — Encounter: Payer: Self-pay | Admitting: Family Medicine

## 2024-12-04 ENCOUNTER — Ambulatory Visit: Admitting: Family Medicine

## 2024-12-04 ENCOUNTER — Other Ambulatory Visit: Payer: Self-pay | Admitting: Cardiology

## 2024-12-04 VITALS — BP 132/68 | HR 74 | Temp 97.0°F | Ht 62.5 in | Wt 153.4 lb

## 2024-12-04 DIAGNOSIS — E785 Hyperlipidemia, unspecified: Secondary | ICD-10-CM

## 2024-12-04 DIAGNOSIS — N1832 Chronic kidney disease, stage 3b: Secondary | ICD-10-CM | POA: Diagnosis not present

## 2024-12-04 DIAGNOSIS — M150 Primary generalized (osteo)arthritis: Secondary | ICD-10-CM | POA: Diagnosis not present

## 2024-12-04 DIAGNOSIS — R5382 Chronic fatigue, unspecified: Secondary | ICD-10-CM | POA: Diagnosis not present

## 2024-12-04 DIAGNOSIS — G43009 Migraine without aura, not intractable, without status migrainosus: Secondary | ICD-10-CM | POA: Diagnosis not present

## 2024-12-04 DIAGNOSIS — K219 Gastro-esophageal reflux disease without esophagitis: Secondary | ICD-10-CM

## 2024-12-04 DIAGNOSIS — E782 Mixed hyperlipidemia: Secondary | ICD-10-CM | POA: Diagnosis not present

## 2024-12-04 DIAGNOSIS — I10 Essential (primary) hypertension: Secondary | ICD-10-CM | POA: Diagnosis not present

## 2024-12-04 DIAGNOSIS — E041 Nontoxic single thyroid nodule: Secondary | ICD-10-CM | POA: Diagnosis not present

## 2024-12-04 DIAGNOSIS — I2510 Atherosclerotic heart disease of native coronary artery without angina pectoris: Secondary | ICD-10-CM

## 2024-12-04 DIAGNOSIS — I729 Aneurysm of unspecified site: Secondary | ICD-10-CM | POA: Diagnosis not present

## 2024-12-04 DIAGNOSIS — F419 Anxiety disorder, unspecified: Secondary | ICD-10-CM | POA: Diagnosis not present

## 2024-12-04 DIAGNOSIS — Z79899 Other long term (current) drug therapy: Secondary | ICD-10-CM

## 2024-12-04 DIAGNOSIS — D500 Iron deficiency anemia secondary to blood loss (chronic): Secondary | ICD-10-CM

## 2024-12-04 LAB — LIPID PANEL
Cholesterol: 150 mg/dL (ref 28–200)
HDL: 38.6 mg/dL — ABNORMAL LOW
LDL Cholesterol: 80 mg/dL (ref 10–99)
NonHDL: 111.61
Total CHOL/HDL Ratio: 4
Triglycerides: 160 mg/dL — ABNORMAL HIGH (ref 10.0–149.0)
VLDL: 32 mg/dL (ref 0.0–40.0)

## 2024-12-04 LAB — CBC WITH DIFFERENTIAL/PLATELET
Basophils Absolute: 0.1 K/uL (ref 0.0–0.1)
Basophils Relative: 1 % (ref 0.0–3.0)
Eosinophils Absolute: 0.2 K/uL (ref 0.0–0.7)
Eosinophils Relative: 1.9 % (ref 0.0–5.0)
HCT: 33.9 % — ABNORMAL LOW (ref 36.0–46.0)
Hemoglobin: 11.5 g/dL — ABNORMAL LOW (ref 12.0–15.0)
Lymphocytes Relative: 26.5 % (ref 12.0–46.0)
Lymphs Abs: 2.1 K/uL (ref 0.7–4.0)
MCHC: 33.9 g/dL (ref 30.0–36.0)
MCV: 97.9 fl (ref 78.0–100.0)
Monocytes Absolute: 0.7 K/uL (ref 0.1–1.0)
Monocytes Relative: 8.5 % (ref 3.0–12.0)
Neutro Abs: 4.8 K/uL (ref 1.4–7.7)
Neutrophils Relative %: 62.1 % (ref 43.0–77.0)
Platelets: 244 K/uL (ref 150.0–400.0)
RBC: 3.47 Mil/uL — ABNORMAL LOW (ref 3.87–5.11)
RDW: 13 % (ref 11.5–15.5)
WBC: 7.8 K/uL (ref 4.0–10.5)

## 2024-12-04 LAB — COMPREHENSIVE METABOLIC PANEL WITH GFR
ALT: 9 U/L (ref 3–35)
AST: 14 U/L (ref 5–37)
Albumin: 4.2 g/dL (ref 3.5–5.2)
Alkaline Phosphatase: 80 U/L (ref 39–117)
BUN: 28 mg/dL — ABNORMAL HIGH (ref 6–23)
CO2: 24 meq/L (ref 19–32)
Calcium: 9.2 mg/dL (ref 8.4–10.5)
Chloride: 106 meq/L (ref 96–112)
Creatinine, Ser: 1.97 mg/dL — ABNORMAL HIGH (ref 0.40–1.20)
GFR: 24.68 mL/min — ABNORMAL LOW
Glucose, Bld: 96 mg/dL (ref 70–99)
Potassium: 4.6 meq/L (ref 3.5–5.1)
Sodium: 138 meq/L (ref 135–145)
Total Bilirubin: 0.2 mg/dL (ref 0.2–1.2)
Total Protein: 7.7 g/dL (ref 6.0–8.3)

## 2024-12-04 LAB — IBC + FERRITIN
Ferritin: 123.6 ng/mL (ref 10.0–291.0)
Iron: 95 ug/dL (ref 42–145)
Saturation Ratios: 37.7 % (ref 20.0–50.0)
TIBC: 252 ug/dL (ref 250.0–450.0)
Transferrin: 180 mg/dL — ABNORMAL LOW (ref 212.0–360.0)

## 2024-12-04 LAB — VITAMIN B12: Vitamin B-12: 905 pg/mL (ref 211–911)

## 2024-12-04 LAB — T4, FREE: Free T4: 0.93 ng/dL (ref 0.60–1.60)

## 2024-12-04 LAB — VITAMIN D 25 HYDROXY (VIT D DEFICIENCY, FRACTURES): VITD: 63.33 ng/mL (ref 30.00–100.00)

## 2024-12-04 LAB — T3, FREE: T3, Free: 3 pg/mL (ref 2.3–4.2)

## 2024-12-04 LAB — TSH: TSH: 1.53 u[IU]/mL (ref 0.35–5.50)

## 2024-12-04 MED ORDER — ALPRAZOLAM 0.5 MG PO TABS: 0.5000 mg | ORAL_TABLET | Freq: Two times a day (BID) | ORAL | 2 refills | Status: AC | PRN

## 2024-12-04 NOTE — Patient Instructions (Signed)

## 2024-12-04 NOTE — Progress Notes (Signed)
 "   Subjective:     Patient ID: Robin Arellano, female    DOB: 04/06/1951, 74 y.o.   MRN: [id number]  Chief Complaint  Patient presents with   Hypertension    Pt is here for chronic issues    Discussed the use of AI scribe software for clinical note transcription with the patient, who gave verbal consent to proceed.  History of Present Illness Robin Arellano is a 74 year old female with hypertension and chronic kidney disease who presents with fatigue and lack of energy. She is accompanied by her sister.  She has been experiencing extreme fatigue and lack of energy for several weeks, with symptoms starting before Christmas. No recent illness, trouble breathing, dark tarry stools, diarrhea, constipation, or abdominal pain.  She is on multiple medications for hypertension, including amlodipine  10 mg, clonidine  0.1 mg once daily, hydralazine  100 mg three times a day, spironolactone  25 mg daily, and potassium once every three days due to previously high levels. There was a lapse in her blood pressure medication due to a pharmacy error, resulting in an 11-day period without medication. Managed by Card and Neph  She has chronic kidney disease stage 3B and is under the care of a nephrologist. Her recent creatinine level was 2.4, which initially led to a refusal for a CT scan, but it was eventually completed w/o contrast. She is also seeing a vascular specialist for aneurysms, with recent imaging showing slight growth in her aortic aneurysms.  She experiences chronic pain and is managed by a pain specialist, taking hydrocodone  for pain relief. She also takes Xanax  as needed, primarily at night for sleep, and occasionally during the day for anxiety. Has been doing this for many years w/o SE.  No SI  She has a history of diverticulitis and takes pantoprazole  daily to manage symptoms. She has undergone cataract surgeries recently and receives injections for macular degeneration.  No new  chest pain. She is on aspirin  therapy and notes easy bruising,  Aneurysms-sees vasc.  Just had CT    Health Maintenance Due  Topic Date Due   Mammogram  12/22/2022    Past Medical History:  Diagnosis Date   Allergy    Anemia    Anxiety    on meds   Back pain    Blood transfusion without reported diagnosis    Cataract    Chronic female pelvic pain    Chronic kidney disease    Coronary artery disease    mild, non-obstructive 11/2018   Depression    on meds   Family history of adverse reaction to anesthesia    sister had difficulty waking up   Fibromyalgia    H/O leukocytosis    Headache    Heart murmur    Hyperlipidemia    on meds   Hypertension    on meds   Macular degeneration of both eyes    wet R, dry L   MI (myocardial infarction) (HCC)    Pt states she did not have a MI- EKG was normal, was GERD   Osteoarthritis    on meds   Ovarian cyst, right    PONV (postoperative nausea and vomiting)    Post-operative nausea and vomiting    Stroke (HCC) 05/14/2022   L PCA   SVD (spontaneous vaginal delivery)    x 2   Vitamin D  deficiency     Past Surgical History:  Procedure Laterality Date   ABDOMINAL HYSTERECTOMY  1994  TAH.BSO   ANTERIOR CERVICAL DECOMP/DISCECTOMY FUSION  2019   APPENDECTOMY  1975   BACK SURGERY  2023   BIOPSY  05/12/2020   Procedure: BIOPSY;  Surgeon: Teressa Toribio SQUIBB, MD;  Location: [facility];  Service: Endoscopy;;   CARDIAC CATHETERIZATION  2020   CATARACT EXTRACTION, BILATERAL     CHOLECYSTECTOMY N/A 07/21/2020   Procedure: LAPAROSCOPIC CHOLECYSTECTOMY WITH INTRAOPERATIVE CHOLANGIOGRAM;  Surgeon: Aron Shoulders, MD;  Location: [facility];  Service: General;  Laterality: N/A;   COLONOSCOPY  08/12/2017   Hx TA (piecemeal)Jacobs-MAC-suprep (good)   ESOPHAGOGASTRODUODENOSCOPY (EGD) WITH PROPOFOL  N/A 05/12/2020   Procedure: ESOPHAGOGASTRODUODENOSCOPY (EGD) WITH PROPOFOL ;  Surgeon: Teressa Toribio SQUIBB, MD;  Location: [facility];  Service:  Endoscopy;  Laterality: N/A;   EUS N/A 05/12/2020   Procedure: UPPER ENDOSCOPIC ULTRASOUND (EUS) RADIAL;  Surgeon: Teressa Toribio SQUIBB, MD;  Location: [facility];  Service: Endoscopy;  Laterality: N/A;   KNEE SURGERY Bilateral 1996   x 2 - arthroscopic   LEFT HEART CATH AND CORONARY ANGIOGRAPHY N/A 11/25/2018   Procedure: LEFT HEART CATH AND CORONARY ANGIOGRAPHY;  Surgeon: Burnard Debby LABOR, MD;  Location: [facility];  Service: Cardiovascular;  Laterality: N/A;   PELVIC LAPAROSCOPY  1989   W LYSIS OF ADHESIONS/L SALPINGONEOSTOMY   TUBAL LIGATION     WISDOM TOOTH EXTRACTION      Current Medications[1]  Allergies[2] ROS neg/noncontributory except as noted HPI/below      Objective:     BP 132/68 (BP Location: Left Arm, Patient Position: Sitting, Cuff Size: Normal)   Pulse 74   Temp (!) 97 F (36.1 C) (Temporal)   Ht 5' 2.5 (1.588 m)   Wt 153 lb 6 oz (69.6 kg)   LMP  (LMP Unknown)   SpO2 97%   BMI 27.61 kg/m  Wt Readings from Last 3 Encounters:  12/04/24 153 lb 6 oz (69.6 kg)  11/30/24 147 lb (66.7 kg)  07/07/24 150 lb 2 oz (68.1 kg)    Physical Exam GENERAL: Well developed, well nourished, no acute distress, appears tired. HEAD EYES EARS NOSE THROAT: Normocephalic, atraumatic, conjunctiva not injected, sclera nonicteric. CARDIAC: Regular rate and rhythm, S1 S2 present, 2/6 murmur, dorsalis pedis 1 plus bilaterally NECK: Supple, no thyromegaly, no nodes, + R carotid bruits. LUNGS: Clear to auscultation bilaterally, no wheezes. ABDOMEN: Bowel sounds present, soft, non-tender, non-distended, no hepatosplenomegaly, no masses. EXTREMITIES: No edema, no swelling. MUSCULOSKELETAL: No gross abnormalities. NEUROLOGICAL: Alert and oriented x3, cranial nerves II through XII intact. PSYCHIATRIC: Normal mood, good eye contact. SKIN: Not pale.       Assessment & Plan:  Essential hypertension  Gastroesophageal reflux disease without esophagitis  Chronic kidney disease,  stage 3b (HCC) -     CBC with Differential/Platelet -     Comprehensive metabolic panel with GFR -     VITAMIN D  25 Hydroxy (Vit-D Deficiency, Fractures)  Primary osteoarthritis involving multiple joints  Anxiety  Mixed hyperlipidemia -     Comprehensive metabolic panel with GFR -     Lipid panel -     VITAMIN D  25 Hydroxy (Vit-D Deficiency, Fractures)  Migraine without aura and without status migrainosus, not intractable  Iron deficiency anemia due to chronic blood loss -     CBC with Differential/Platelet -     IBC + Ferritin -     Vitamin B12  Chronic fatigue  Thyroid  nodule -     T3, free -     T4, free -  TSH -     VITAMIN D  25 Hydroxy (Vit-D Deficiency, Fractures)  Pseudoaneurysm  Other orders -     ALPRAZolam ; Take 1 tablet (0.5 mg total) by mouth 2 (two) times daily as needed for anxiety.  Dispense: 35 tablet; Refill: 2    Assessment and Plan Assessment & Plan Chronic fatigue   She has experienced several weeks of fatigue, possibly due to medication interactions or underlying conditions like thyroid  dysfunction or electrolyte imbalances. No recent illness is reported. Chronic pain and anxiety may also contribute. A full blood panel is ordered to assess potential causes, including thyroid  function and electrolyte levels.  Essential hypertension   Her blood pressure is well-controlled on the current regimen. Recent pharmacy errors caused a temporary lapse in medication. Ensure all blood pressure medications are refilled and coordinated with the pharmacy.  Chronic kidney disease, stage 3b   She has stage 3b chronic kidney disease with a recent creatinine level of 2.4 and is under nephrologist care. This condition may impact medication management and potassium levels. Continue follow-up with the nephrologist and monitor potassium levels.  Aortic aneurysms (aortic arch and ascending aorta)   Aortic aneurysms show a slight increase in size on a recent CT scan. She  will follow up with a vascular doctor. Continue follow-up and monitor aneurysm size and symptoms.  Primary osteoarthritis involving multiple joints   Chronic pain is managed with hydrocodone , with no recent changes in the pain management regimen. Continue the current pain management regimen with hydrocodone .  Gastroesophageal reflux disease   GERD is managed with pantoprazole , which also aids in diverticulitis-related pain. Continue pantoprazole  for GERD management.  Thyroid  nodule-check labs  HLD-chronic.  On zetia  and pravastatin .  Check levels  Anxiety disorder   Anxiety is managed with Xanax , taken as needed, primarily at night for sleep. It may contribute to fatigue and medication interactions. Continue Xanax  as needed for anxiety management.  Migraine without aura   Migraines are managed with butanediol, which may interact with other medications contributing to fatigue. Continue the current migraine management regimen. Managed by pain mgmt  Thyroid  nodule   Thyroid  function will be assessed with upcoming blood work to rule out thyroid  dysfunction as a cause of fatigue. Thyroid  function tests are ordered as part of the blood panel.  Iron def anemia-partly CKD.  Sees heme.  Check labs     Return in about 3 months (around 03/04/2025) for mood.  Jenkins CHRISTELLA Carrel, MD     [1]  Current Outpatient Medications:    albuterol  (VENTOLIN  HFA) 108 (90 Base) MCG/ACT inhaler, INHALE 2 PUFFS INTO THE LUNGS EVERY 6 HOURS AS NEEDED FOR WHEEZING OR SHORTNESS OF BREATH, Disp: 6.7 g, Rfl: 1   amLODipine  (NORVASC ) 10 MG tablet, Take 1 tablet (10 mg total) by mouth daily., Disp: 30 tablet, Rfl: 1   Ascorbic Acid  (VITAMIN C) 1000 MG tablet, Take 1,000 mg by mouth in the morning., Disp: , Rfl:    aspirin  EC 81 MG tablet, Take 1 tablet (81 mg total) by mouth daily. Swallow whole., Disp: 90 tablet, Rfl: 3   butalbital -acetaminophen -caffeine  (FIORICET ) 50-325-40 MG tablet, Take 1 tablet by mouth every 6  (six) hours as needed for headache., Disp: 20 tablet, Rfl: 0   Cholecalciferol  (VITAMIN D ) 50 MCG (2000 UT) tablet, Take 2,000 Units by mouth in the morning., Disp: , Rfl:    cloNIDine  (CATAPRES ) 0.1 MG tablet, TAKE 1 TABLET(0.1 MG) BY MOUTH TWICE DAILY, Disp: 90 tablet, Rfl: 3   ezetimibe  (  ZETIA ) 10 MG tablet, TAKE 1 TABLET(10 MG) BY MOUTH DAILY, Disp: 90 tablet, Rfl: 3   hydrALAZINE  (APRESOLINE ) 100 MG tablet, Take 1 tablet (100 mg total) by mouth 3 (three) times daily. Pt must keep upcoming followup appt with Cardiology in February 2026 for any more refills. Thank You, Disp: 270 tablet, Rfl: 0   HYDROcodone -acetaminophen  (NORCO) 10-325 MG tablet, Take 1 tablet by mouth every 6 (six) hours as needed., Disp: , Rfl:    pantoprazole  (PROTONIX ) 40 MG tablet, TAKE 1 TABLET(40 MG) BY MOUTH DAILY, Disp: 90 tablet, Rfl: 3   Potassium Chloride  ER 20 MEQ TBCR, Take 1 tablet (20 mEq total) by mouth in the morning and at bedtime. (Patient taking differently: Take 20 mEq by mouth every other day.), Disp: 180 tablet, Rfl: 0   pravastatin  (PRAVACHOL ) 40 MG tablet, Take 1 tablet (40 mg total) by mouth every evening., Disp: 30 tablet, Rfl: 1   spironolactone  (ALDACTONE ) 25 MG tablet, Take 25 mg by mouth daily., Disp: , Rfl:    valACYclovir  (VALTREX ) 500 MG tablet, TAKE 1 TABLET BY MOUTH TWICE DAILY FOR 3 TO 5 DAYS THEN TAKE DAILY AS NEEDED, Disp: 30 tablet, Rfl: 0   ALPRAZolam  (XANAX ) 0.5 MG tablet, Take 1 tablet (0.5 mg total) by mouth 2 (two) times daily as needed for anxiety., Disp: 35 tablet, Rfl: 2 [2]  Allergies Allergen Reactions   Lidocaine  Hives   Zanaflex [Tizanidine] Anaphylaxis, Hives, Itching and Swelling   Cymbalta [Duloxetine Hcl] Itching and Other (See Comments)    Spaced out feeling   Lyrica [Pregabalin] Itching, Swelling and Other (See Comments)    Spaced out feeling   Neurontin [Gabapentin] Itching and Swelling   Latex Rash   "

## 2024-12-05 ENCOUNTER — Ambulatory Visit: Payer: Self-pay | Admitting: Family Medicine

## 2024-12-05 NOTE — Progress Notes (Signed)
 Labs stable for her.  Nothing to explain fatigue

## 2024-12-07 ENCOUNTER — Ambulatory Visit: Payer: PPO

## 2024-12-07 NOTE — Progress Notes (Signed)
Called pt and notified via message

## 2024-12-09 ENCOUNTER — Other Ambulatory Visit: Payer: Self-pay | Admitting: Cardiology

## 2024-12-10 ENCOUNTER — Ambulatory Visit

## 2024-12-11 ENCOUNTER — Ambulatory Visit: Payer: Self-pay | Admitting: Cardiology

## 2024-12-11 ENCOUNTER — Other Ambulatory Visit: Payer: Self-pay | Admitting: Cardiology

## 2024-12-11 ENCOUNTER — Ambulatory Visit

## 2024-12-11 DIAGNOSIS — I639 Cerebral infarction, unspecified: Secondary | ICD-10-CM

## 2024-12-11 LAB — CUP PACEART REMOTE DEVICE CHECK
Date Time Interrogation Session: 20260129231655
Implantable Pulse Generator Implant Date: 20231117

## 2024-12-11 MED ORDER — AMLODIPINE BESYLATE 10 MG PO TABS: 10.0000 mg | ORAL_TABLET | Freq: Every day | ORAL | 0 refills | Status: DC

## 2024-12-15 ENCOUNTER — Encounter (HOSPITAL_COMMUNITY)

## 2024-12-15 ENCOUNTER — Ambulatory Visit: Admitting: Vascular Surgery

## 2024-12-15 NOTE — Progress Notes (Signed)
 Remote Loop Recorder Transmission

## 2024-12-18 ENCOUNTER — Ambulatory Visit (HOSPITAL_BASED_OUTPATIENT_CLINIC_OR_DEPARTMENT_OTHER): Admitting: Cardiology

## 2024-12-18 ENCOUNTER — Encounter (HOSPITAL_BASED_OUTPATIENT_CLINIC_OR_DEPARTMENT_OTHER): Payer: Self-pay | Admitting: Cardiology

## 2024-12-18 VITALS — BP 134/68 | HR 57 | Ht 62.0 in | Wt 157.2 lb

## 2024-12-18 DIAGNOSIS — Z79899 Other long term (current) drug therapy: Secondary | ICD-10-CM

## 2024-12-18 DIAGNOSIS — E785 Hyperlipidemia, unspecified: Secondary | ICD-10-CM

## 2024-12-18 DIAGNOSIS — I2510 Atherosclerotic heart disease of native coronary artery without angina pectoris: Secondary | ICD-10-CM

## 2024-12-18 DIAGNOSIS — E782 Mixed hyperlipidemia: Secondary | ICD-10-CM

## 2024-12-18 DIAGNOSIS — I10 Essential (primary) hypertension: Secondary | ICD-10-CM

## 2024-12-18 MED ORDER — HYDRALAZINE HCL 100 MG PO TABS: 100.0000 mg | ORAL_TABLET | Freq: Three times a day (TID) | ORAL | 3 refills | Status: AC

## 2024-12-18 MED ORDER — PRAVASTATIN SODIUM 40 MG PO TABS: 40.0000 mg | ORAL_TABLET | Freq: Every evening | ORAL | 3 refills | Status: AC

## 2024-12-18 MED ORDER — AMLODIPINE BESYLATE 10 MG PO TABS: 10.0000 mg | ORAL_TABLET | Freq: Every day | ORAL | 3 refills | Status: AC

## 2024-12-18 NOTE — Progress Notes (Unsigned)
 " Cardiology Office Note:  .    Date:  12/18/2024  ID:  Robin Arellano, Robin Arellano 14-Jul-1951, MRN [id number] PCP: Wendolyn Jenkins Jansky, MD  [facility] Providers Cardiologist:  Shelda Bruckner, MD     History of Present Illness: .    Robin Arellano is a 74 y.o. female with a hx of nonobstructive CAD, hypertension, hyperlipidemia (familial), Cspine surgery 2021, CVA s/p ILR placement 09/28/2022, here for follow-up.   Was seen by Dr. Waddell on 09/28/22 for ILR placement for cryptogenic stroke. MRI head 05/2022 which noted a prior PCA infarct.   She is formerly a patient of Dr. Hobart, last seen by her 12/27/2022. At that visit she complained of fatigue but was otherwise doing well from a cardiovascular perspective. She continued to have headaches which had increased since her stroke, but her blood pressure had significantly improved to 130-140s systolic at home.   Today, she is accompanied by her sister.  Cardiovascular risk factors: Prior clinical ASCVD: Nonobstructive CAD seen on cath 2020. Comorbid conditions: Hypertension - in the office her blood pressure is 156/64.  Usually in the 140s systolic at home, sometimes in the 130s. Per her nephrologist, her clonidine  was reduced to 0.1 mg BID, from TID. Blood pressure at their visit had been 123 systolic, but has not been that low since then. Since reducing clonidine  she has noticed much of a difference in her blood pressures. She is scheduled to follow up with Dr. Rayburn on 09/05/23. Metabolic syndrome/Obesity:  Current weight 152 lbs. Chronic inflammatory conditions: None. Tobacco use history: Former smoker. Family history: Her sister is also a patient of mine. Prior pertinent testing and/or incidental findings: Echo 05/2022 with LVEF 60-65%, mild LVH, trivial mitral regurgitation, aortic sclerosis.  Exercise level: In the last month her most strenuous activities include weight-bearing exercises for her right thigh. She  walks along her house and completes housework without anginal symptoms. However, she does have other chest pain that occurs about once a week and resolves with taking aspirin . She describes this as her chest hurts, always localized in her central chest possibly related to the location of her cervical disc surgery in 2021. Overall her breathing has been stable. Additionally she complains of constant back pain, and new severe neck pain over the last 3 days. Hot packs provide some relief. On physical exam today she has palpable muscle knots of her neck. Of note, she recently had dental work performed (temporary crown).  She denies any palpitations, shortness of breath, peripheral edema, lightheadedness, headaches, syncope, orthopnea, or PND.  Today: Feels tired all the time. Had workup with Dr. Wendolyn, reviewed. BP 134/68 at home today, has been doing well, no significantly elevated blood pressures recently. Cr slightly worse than prior.   ROS:  Denies chest pain, shortness of breath at rest or with normal exertion. No PND, orthopnea, LE edema or unexpected weight gain. No syncope or palpitations. ROS otherwise negative except as noted.   Studies Reviewed: SABRA    EKG Interpretation Date/Time:  Friday December 18 2024 15:54:19 EST Ventricular Rate:  56 PR Interval:  178 QRS Duration:  88 QT Interval:  472 QTC Calculation: 455 R Axis:   41  Text Interpretation: Sinus bradycardia with Premature atrial complexes Confirmed by Bruckner Shelda [phone]) on 12/18/2024 4:08:09 PM      Physical Exam:    VS:  BP 134/68 (Cuff Size: Normal)   Pulse (!) 57   Ht 5' 2 (1.575 m)   Wt 157  lb 3.2 oz (71.3 kg)   LMP  (LMP Unknown)   SpO2 95%   BMI 28.75 kg/m    Wt Readings from Last 3 Encounters:  12/18/24 157 lb 3.2 oz (71.3 kg)  12/04/24 153 lb 6 oz (69.6 kg)  11/30/24 147 lb (66.7 kg)    GEN: Well nourished, well developed in no acute distress HEENT: Normal, moist mucous membranes NECK: No  JVD CARDIAC: regular rhythm, normal S1 and S2, no rubs or gallops. No murmur. VASCULAR: Radial and DP pulses 2+ bilaterally. No carotid bruits RESPIRATORY:  Clear to auscultation without rales, wheezing or rhonchi  ABDOMEN: Soft, non-tender, non-distended MUSCULOSKELETAL:  Ambulates independently; Tight palpable muscle knots of posterior neck.  SKIN: Warm and dry, no edema NEUROLOGIC:  Alert and oriented x 3. No focal neuro deficits noted. PSYCHIATRIC:  Normal affect   ASSESSMENT AND PLAN: .    History of CVA s/p ILR Carotid disease History of penetrating aorta ulcer in aortic arch found 05/2022 Coronary artery calcification consistent with CAD Aortic atherosclerosis Family history of hyperlipidemia -no arrhythmias have been noted on ILR -carotid duplex 05/2024 noted 80-99% stenosis in R ICA, L ICA with 40-59% stenosis. Seen by Dr. Magda 06/16/24, thought to be elevated velocities 2/2 tortuosity, with CT angio suggesting against critical stenosis, being monitored with imaging. -aortic ulcer had been stable, though most recent CT 12/03/24 was concerning for slightly enlarged outpouching (study was noted to be noncontrast and not ideal for evaluation of this). Has upcoming appt next week with Dr. Magda for follow up -continue aspirin , ezetimibe , pravastatin  -lipids 12/04/24 show TG 160, LDL 80, not at goal. We discussed options today. Has not tolerated atorvastatin , rosuvastatin . We discussed PCSK9i. She is interested but her sister is concerned as they had a family member with a bad reaction to repatha. After discussion, she wants to hold on this for now and recheck lipids in the future. She will work on diet and exercise changes if she can. -reviewed red flag warning signs that need immediate medical attention  Hypertension Chronic kidney disease, stage 4 -was followed by Dr. Alica at [facility], pending establishment with Dr. Dennise -currently on clonidine , amlodipine , hydralazine ,  spironolactone , no changes today -Last GFR 25, consistent with CKD 4  CV risk counseling and prevention -recommend heart healthy/Mediterranean diet, with whole grains, fruits, vegetable, fish, lean meats, nuts, and olive oil. Limit salt. -recommend moderate walking, 3-5 times/week for 30-50 minutes each session. Aim for at least 150 minutes.week. Goal should be pace of 3 miles/hours, or walking 1.5 miles in 30 minutes -recommend avoidance of tobacco products. Avoid excess alcohol.  Dispo: Follow-up in 12 months, or sooner as needed.  Signed, Shelda Bruckner, MD   "

## 2024-12-18 NOTE — Patient Instructions (Signed)
 Medication Instructions:  No changes *If you need a refill on your cardiac medications before your next appointment, please call your pharmacy*  Lab Work: none  Testing/Procedures: none  Follow-Up: At [facility], you and your health needs are our priority.  As part of our continuing mission to provide you with exceptional heart care, our providers are all part of one team.  This team includes your primary Cardiologist (physician) and Advanced Practice Providers or APPs (Physician Assistants and Nurse Practitioners) who all work together to provide you with the care you need, when you need it.  Your next appointment:   12 month(s)  Provider:   Shelda Bruckner, MD, Rosaline Bane, NP, or Reche Finder, NP

## 2024-12-22 ENCOUNTER — Ambulatory Visit: Admitting: Vascular Surgery

## 2024-12-22 ENCOUNTER — Ambulatory Visit (HOSPITAL_COMMUNITY)

## 2025-01-07 ENCOUNTER — Other Ambulatory Visit

## 2025-01-07 ENCOUNTER — Ambulatory Visit: Admitting: Oncology

## 2025-01-11 ENCOUNTER — Ambulatory Visit

## 2025-01-11 ENCOUNTER — Ambulatory Visit: Payer: PPO

## 2025-02-11 ENCOUNTER — Ambulatory Visit

## 2025-02-15 ENCOUNTER — Ambulatory Visit: Payer: PPO

## 2025-03-08 ENCOUNTER — Ambulatory Visit: Admitting: Family Medicine

## 2025-03-14 ENCOUNTER — Ambulatory Visit

## 2025-03-15 ENCOUNTER — Ambulatory Visit

## 2025-03-22 ENCOUNTER — Ambulatory Visit: Payer: PPO

## 2025-04-14 ENCOUNTER — Ambulatory Visit

## 2025-04-15 ENCOUNTER — Ambulatory Visit

## 2025-04-26 ENCOUNTER — Ambulatory Visit: Payer: PPO

## 2025-05-04 ENCOUNTER — Ambulatory Visit: Admitting: Neurology

## 2025-05-15 ENCOUNTER — Ambulatory Visit

## 2025-05-17 ENCOUNTER — Ambulatory Visit

## 2025-05-31 ENCOUNTER — Ambulatory Visit: Payer: PPO

## 2025-06-15 ENCOUNTER — Ambulatory Visit

## 2025-07-05 ENCOUNTER — Ambulatory Visit: Payer: PPO

## 2025-07-16 ENCOUNTER — Ambulatory Visit

## 2025-08-09 ENCOUNTER — Ambulatory Visit: Payer: PPO

## 2025-08-16 ENCOUNTER — Ambulatory Visit

## 2025-09-16 ENCOUNTER — Ambulatory Visit

## 2025-10-17 ENCOUNTER — Ambulatory Visit

## 2025-11-17 ENCOUNTER — Ambulatory Visit

## 2025-12-02 ENCOUNTER — Ambulatory Visit
